# Patient Record
Sex: Female | Born: 1945 | Race: White | Hispanic: No | Marital: Married | Smoking: Never smoker
Health system: Southern US, Community
[De-identification: ages and names within clinical notes are randomized; demographics above are authoritative.]

## PROBLEM LIST (undated history)

## (undated) DIAGNOSIS — J449 Chronic obstructive pulmonary disease, unspecified: Secondary | ICD-10-CM

## (undated) DIAGNOSIS — C801 Malignant (primary) neoplasm, unspecified: Secondary | ICD-10-CM

## (undated) DIAGNOSIS — H269 Unspecified cataract: Secondary | ICD-10-CM

## (undated) DIAGNOSIS — T7840XA Allergy, unspecified, initial encounter: Secondary | ICD-10-CM

## (undated) DIAGNOSIS — K219 Gastro-esophageal reflux disease without esophagitis: Secondary | ICD-10-CM

## (undated) DIAGNOSIS — D869 Sarcoidosis, unspecified: Secondary | ICD-10-CM

## (undated) DIAGNOSIS — B019 Varicella without complication: Secondary | ICD-10-CM

## (undated) DIAGNOSIS — R011 Cardiac murmur, unspecified: Secondary | ICD-10-CM

## (undated) DIAGNOSIS — E785 Hyperlipidemia, unspecified: Secondary | ICD-10-CM

## (undated) DIAGNOSIS — M1990 Unspecified osteoarthritis, unspecified site: Secondary | ICD-10-CM

## (undated) DIAGNOSIS — I10 Essential (primary) hypertension: Secondary | ICD-10-CM

## (undated) DIAGNOSIS — Z9189 Other specified personal risk factors, not elsewhere classified: Secondary | ICD-10-CM

## (undated) DIAGNOSIS — F419 Anxiety disorder, unspecified: Secondary | ICD-10-CM

## (undated) DIAGNOSIS — K635 Polyp of colon: Secondary | ICD-10-CM

## (undated) DIAGNOSIS — J45909 Unspecified asthma, uncomplicated: Secondary | ICD-10-CM

## (undated) HISTORY — DX: Essential (primary) hypertension: I10

## (undated) HISTORY — PX: CARDIAC CATHETERIZATION: SHX172

## (undated) HISTORY — DX: Malignant (primary) neoplasm, unspecified: C80.1

## (undated) HISTORY — DX: Unspecified cataract: H26.9

## (undated) HISTORY — DX: Varicella without complication: B01.9

## (undated) HISTORY — DX: Polyp of colon: K63.5

## (undated) HISTORY — DX: Cardiac murmur, unspecified: R01.1

## (undated) HISTORY — DX: Allergy, unspecified, initial encounter: T78.40XA

## (undated) HISTORY — DX: Sarcoidosis, unspecified: D86.9

## (undated) HISTORY — DX: Anxiety disorder, unspecified: F41.9

## (undated) HISTORY — DX: Hyperlipidemia, unspecified: E78.5

## (undated) HISTORY — DX: Gastro-esophageal reflux disease without esophagitis: K21.9

## (undated) HISTORY — DX: Other specified personal risk factors, not elsewhere classified: Z91.89

## (undated) HISTORY — DX: Unspecified asthma, uncomplicated: J45.909

## (undated) HISTORY — DX: Unspecified osteoarthritis, unspecified site: M19.90

## (undated) HISTORY — DX: Chronic obstructive pulmonary disease, unspecified: J44.9

## (undated) HISTORY — PX: EYE SURGERY: SHX253

---

## 1976-06-16 DIAGNOSIS — D869 Sarcoidosis, unspecified: Secondary | ICD-10-CM

## 1976-06-16 HISTORY — DX: Sarcoidosis, unspecified: D86.9

## 1976-06-16 HISTORY — PX: LUNG BIOPSY: SHX232

## 1985-03-18 HISTORY — PX: ABDOMINAL HYSTERECTOMY: SHX81

## 2008-03-18 DIAGNOSIS — K635 Polyp of colon: Secondary | ICD-10-CM

## 2008-03-18 HISTORY — DX: Polyp of colon: K63.5

## 2016-01-22 LAB — HM COLONOSCOPY

## 2016-12-31 DIAGNOSIS — M810 Age-related osteoporosis without current pathological fracture: Secondary | ICD-10-CM | POA: Insufficient documentation

## 2016-12-31 DIAGNOSIS — M8589 Other specified disorders of bone density and structure, multiple sites: Secondary | ICD-10-CM | POA: Insufficient documentation

## 2016-12-31 LAB — HM DEXA SCAN

## 2017-12-16 LAB — HM MAMMOGRAPHY

## 2018-04-14 ENCOUNTER — Ambulatory Visit (INDEPENDENT_AMBULATORY_CARE_PROVIDER_SITE_OTHER): Payer: Medicare Other | Admitting: Nurse Practitioner

## 2018-04-14 ENCOUNTER — Encounter: Payer: Self-pay | Admitting: Nurse Practitioner

## 2018-04-14 VITALS — BP 146/76 | HR 92 | Temp 98.5°F | Ht 62.75 in | Wt 146.0 lb

## 2018-04-14 DIAGNOSIS — Z862 Personal history of diseases of the blood and blood-forming organs and certain disorders involving the immune mechanism: Secondary | ICD-10-CM | POA: Diagnosis not present

## 2018-04-14 DIAGNOSIS — Z959 Presence of cardiac and vascular implant and graft, unspecified: Secondary | ICD-10-CM

## 2018-04-14 DIAGNOSIS — I10 Essential (primary) hypertension: Secondary | ICD-10-CM | POA: Diagnosis not present

## 2018-04-14 DIAGNOSIS — Z87898 Personal history of other specified conditions: Secondary | ICD-10-CM | POA: Diagnosis not present

## 2018-04-14 NOTE — Patient Instructions (Signed)
Please sign medical release to get records from previous pcp, cardiology, pulmonology, and GYN.  Thank you for choosing [facility] to provide your health needs.

## 2018-04-15 ENCOUNTER — Encounter: Payer: Self-pay | Admitting: Nurse Practitioner

## 2018-04-15 NOTE — Progress Notes (Signed)
Subjective:  Patient ID: Kristen Suarez, female    DOB: [birth year]  Age: 73 y.o. MRN: [id number]  CC: Establish Care (est care/go over medications. referral to heart doctor)  HPI Moved from [place] to [place] with husband.  [employer] Loop monitor for 44yr. Hx of Syncopal episodes, last happened 01/2018 while execising (presyncopal, warning symptom (nausea and lightedheaded), incident occurs about every 67months per patient Needs ref to cardiology.  HTN: HCTZ and amlodipine were recently added Intermittent LE edema.  Hx of sarcoidosis. needs referral to pulmonology. Followed annually.  Hx of bladder prolapse. Unable to tolerate pessary, home bladder exercise and premarin cream (twice a week), managed by GYN.  Up to date with mammogram.  Will need refills in 65month  Last AWV done 10/2017.  Last labs done 12/2017. Reviewed past Medical, Social and Family history today.  Outpatient Medications Prior to Visit  Medication Sig Dispense Refill  . amLODipine (NORVASC) 5 MG tablet Take 5 mg by mouth daily.    Marland Kitchen CALCIUM PO Take by mouth.    . conjugated estrogens (PREMARIN) vaginal cream Place 1 Applicatorful vaginally daily. 1 gram 2 times a week.    . docusate sodium (STOOL SOFTENER LAXATIVE) 100 MG capsule Take 100 mg by mouth 2 (two) times daily.    . famotidine (PEPCID) 10 MG tablet Take 10 mg by mouth 2 (two) times daily. PRN    . hydrochlorothiazide (HYDRODIURIL) 25 MG tablet Take 25 mg by mouth daily. Pt report taking 1/2 tab daily.    Marland Kitchen lisinopril (PRINIVIL,ZESTRIL) 40 MG tablet Take 40 mg by mouth daily.    . Multiple Vitamins-Minerals (MULTIVITAL PO) Take by mouth.    . Probiotic Product (PROBIOTIC DAILY PO) Take by mouth.    . simvastatin (ZOCOR) 20 MG tablet Take 20 mg by mouth daily.     No facility-administered medications prior to visit.     ROS See HPI  Objective:  BP (!) 146/76   Pulse 92   Temp 98.5 F (36.9 C) (Oral)   Ht 5' 2.75" (1.594 m)   Wt 146 lb  (66.2 kg)   SpO2 96%   BMI 26.07 kg/m   BP Readings from Last 3 Encounters:  04/14/18 (!) 146/76    Wt Readings from Last 3 Encounters:  04/14/18 146 lb (66.2 kg)    Physical Exam Neck:     Musculoskeletal: Normal range of motion and neck supple.  Cardiovascular:     Rate and Rhythm: Normal rate and regular rhythm.     Pulses: Normal pulses.     Heart sounds: Normal heart sounds.  Pulmonary:     Effort: Pulmonary effort is normal.     Breath sounds: Normal breath sounds.  Musculoskeletal:     Right lower leg: No edema.     Left lower leg: No edema.  Lymphadenopathy:     Cervical: No cervical adenopathy.  Neurological:     Mental Status: She is alert and oriented to person, place, and time.  Psychiatric:        Mood and Affect: Mood normal.        Behavior: Behavior normal.        Thought Content: Thought content normal.     No results found for: WBC, HGB, HCT, PLT, GLUCOSE, CHOL, TRIG, HDL, LDLDIRECT, LDLCALC, ALT, AST, NA, K, CL, CREATININE, BUN, CO2, TSH, PSA, INR, GLUF, HGBA1C, MICROALBUR  Patient was never admitted.  Assessment & Plan:   Kambrie was seen today for establish care.  Diagnoses and all orders for this visit:  Hx of syncope -     Ambulatory referral to Cardiology  Hx of sarcoidosis -     Ambulatory referral to Pulmonology  Cardiac device in situ -     Ambulatory referral to Cardiology   I am having [name] Manthe maintain her Multiple Vitamins-Minerals (MULTIVITAL PO), CALCIUM PO, Probiotic Product (PROBIOTIC DAILY PO), hydrochlorothiazide, simvastatin, amLODipine, lisinopril, famotidine, docusate sodium, and conjugated estrogens.  No orders of the defined types were placed in this encounter.   Problem List Items Addressed This Visit    None    Visit Diagnoses    Hx of syncope    -  Primary   Relevant Orders   Ambulatory referral to Cardiology   Hx of sarcoidosis       Relevant Orders   Ambulatory referral to Pulmonology    Cardiac device in situ       Relevant Orders   Ambulatory referral to Cardiology       Follow-up: Return in about 2 months (around 06/13/2018) for HTN and .  Wilfred Lacy, NP

## 2018-04-16 ENCOUNTER — Encounter: Payer: Self-pay | Admitting: Nurse Practitioner

## 2018-04-27 ENCOUNTER — Ambulatory Visit (INDEPENDENT_AMBULATORY_CARE_PROVIDER_SITE_OTHER): Payer: Medicare Other | Admitting: Internal Medicine

## 2018-04-27 ENCOUNTER — Encounter: Payer: Self-pay | Admitting: Internal Medicine

## 2018-04-27 VITALS — BP 132/82 | HR 84 | Ht 62.75 in | Wt 145.2 lb

## 2018-04-27 DIAGNOSIS — R55 Syncope and collapse: Secondary | ICD-10-CM | POA: Diagnosis not present

## 2018-04-27 NOTE — Patient Instructions (Signed)

## 2018-04-27 NOTE — Progress Notes (Signed)
ELECTROPHYSIOLOGY CONSULT NOTE  Patient ID: Kristen Suarez, MRN: [id number], DOB/AGE: 1945/12/02 73 y.o. Admit date: (Not on file) Date of Consult: 04/27/2018  Primary Physician: Flossie Buffy, NP Primary Cardiologist: Pecolia Ades Winkels is a 73 y.o. female who is being seen today for the evaluation of syncope  at the request of CLN.    HPI Kristen Suarez is a 73 y.o. female referred for recurrent syncope.  These episodes have been sporadic and go back about 20 years.  Many of them have happened on airplanes preceded by vomiting.  Others have been associated with hot environments often standing.  She has had a little warning apart from the vomiting.  Recovery symptoms include nausea, blushing, diaphoresis and residual fatigue.  There is some orthostatic intolerance.  Recognizable.  Most recent episode occurred about 15 months ago prior to the implantation of her loop recorder.  She was at abdominal rehab.  She became aware that something was not right and was becoming presyncopal.  She laid down and the symptoms gradually abated.  The recovery symptoms were recognized.  Her husband is a Marine scientist.  He has noted that she has been pale and has had a "thready pulse "  No history of palpitations.  Cardiac evaluation is included apparently an ultrasound, stress testing and blood work.  These of all been assumed to be normal by the patient.  They are not yet available in epic although they have been delivered to her PCP   After most recent episode she had an implantable loop recorder placed in [place].  Has had   No interval syncope since implantation.  Hx of sarcoidosis-diagnosed by pulmonary biopsy in the 1970s.  She was on steroids on and off until the 90s.  She has been followed by her pulmonologist until just recently prior to her move.   Past Medical History:  Diagnosis Date  . Arthritis   . Asthma   . Chickenpox   . Colon polyps 2010  . Heart murmur   . History  of fainting spells of unknown cause   . Hyperlipidemia   . Hypertension   . Sarcoid 06/16/1976      Surgical History:  Past Surgical History:  Procedure Laterality Date  . ABDOMINAL HYSTERECTOMY  1987  . LUNG BIOPSY  06/16/1976     Home Meds: Current Meds  Medication Sig  . Albuterol Sulfate 2.5 MG/0.5ML NEBU Inhale into the lungs. 1 vail daily  . amLODipine (NORVASC) 5 MG tablet Take 5 mg by mouth daily.  Marland Kitchen CALCIUM PO Take 1 tablet by mouth daily.   Marland Kitchen conjugated estrogens (PREMARIN) vaginal cream Place 1 Applicatorful vaginally daily. 1 gram 2 times a week.  . docusate sodium (STOOL SOFTENER LAXATIVE) 100 MG capsule Take 100 mg by mouth 2 (two) times daily.  . famotidine (PEPCID) 10 MG tablet Take 10 mg by mouth 2 (two) times daily. PRN  . fluticasone-salmeterol (ADVAIR HFA) 115-21 MCG/ACT inhaler Inhale 2 puffs into the lungs 2 (two) times daily.  . hydrochlorothiazide (HYDRODIURIL) 25 MG tablet Take 25 mg by mouth daily. Pt report taking 1/2 tab daily.  Marland Kitchen lisinopril (PRINIVIL,ZESTRIL) 40 MG tablet Take 40 mg by mouth daily.  . Multiple Vitamins-Minerals (MULTIVITAL PO) Take 1 tablet by mouth daily.   . Probiotic Product (PROBIOTIC DAILY PO) Take 1 tablet by mouth daily.   . simvastatin (ZOCOR) 20 MG tablet Take 20 mg by mouth daily.  . Tiotropium Bromide Monohydrate (  SPIRIVA RESPIMAT) 2.5 MCG/ACT AERS Inhale into the lungs. Inhaled 2 puff daily    Allergies:  Allergies  Allergen Reactions  . Penicillins Itching  . Sulfa Antibiotics Itching    Social History   Socioeconomic History  . Marital status: Married    Spouse name: Not on file  . Number of children: Not on file  . Years of education: Not on file  . Highest education level: Not on file  Occupational History  . Not on file  Social Needs  . Financial resource strain: Not on file  . Food insecurity:    Worry: Not on file    Inability: Not on file  . Transportation needs:    Medical: Not on file     Non-medical: Not on file  Tobacco Use  . Smoking status: Never Smoker  . Smokeless tobacco: Never Used  Substance and Sexual Activity  . Alcohol use: Yes    Comment: once in a while  . Drug use: Never  . Sexual activity: Not on file  Lifestyle  . Physical activity:    Days per week: Not on file    Minutes per session: Not on file  . Stress: Not on file  Relationships  . Social connections:    Talks on phone: Not on file    Gets together: Not on file    Attends religious service: Not on file    Active member of club or organization: Not on file    Attends meetings of clubs or organizations: Not on file    Relationship status: Not on file  . Intimate partner violence:    Fear of current or ex partner: Not on file    Emotionally abused: Not on file    Physically abused: Not on file    Forced sexual activity: Not on file  Other Topics Concern  . Not on file  Social History Narrative  . Not on file     Family History  Problem Relation Age of Onset  . Arthritis Mother   . Cancer Mother   . Depression Mother   . Heart attack Mother   . Hyperlipidemia Mother   . Hypertension Mother   . Alcohol abuse Father   . Heart disease Father   . Hypertension Father   . Hyperlipidemia Father   . Heart disease Sister   . Stroke Sister   . Alcohol abuse Brother   . Heart disease Brother   . Hypertension Brother   . Hyperlipidemia Brother   . Multiple sclerosis Daughter   . Hyperlipidemia Son   . Heart disease Maternal Grandfather   . Heart disease Paternal Grandfather   . Hyperlipidemia Sister   . Alcohol abuse Sister   . Hyperlipidemia Sister   . Hypertension Sister   . Alcohol abuse Brother   . Early death Brother   . Heart attack Brother   . Heart disease Brother   . Hyperlipidemia Brother      ROS:  Please see the history of present illness.     All other systems reviewed and negative.    Physical Exam: Blood pressure 132/82, pulse 84, height 5' 2.75" (1.594 m),  weight 145 lb 3.2 oz (65.9 kg), SpO2 97 %. General: Well developed, well nourished female in no acute distress. Head: Normocephalic, atraumatic, sclera non-icteric, no xanthomas, nares are without discharge. EENT: normal  Lymph Nodes:  none Neck: Negative for carotid bruits. JVD not elevated. Back:without scoliosis kyphosis Lungs: Clear bilaterally to auscultation without wheezes,  rales, or rhonchi. Breathing is unlabored. Heart: RRR with S1 S2.  2 /6 systolic murmur . No rubs, or gallops appreciated. Abdomen: Soft, non-tender, non-distended with normoactive bowel sounds. No hepatomegaly. No rebound/guarding. No obvious abdominal masses. Msk:  Strength and tone appear normal for age. Extremities: No clubbing or cyanosis. No edema.  Distal pedal pulses are 2+ and equal bilaterally. Skin: Warm and Dry Neuro: Alert and oriented X 3. CN III-XII intact Grossly normal sensory and motor function . Psych:  Responds to questions appropriately with a normal affect.      Labs: Cardiac Enzymes No results for input(s): CKTOTAL, CKMB, TROPONINI in the last 72 hours. CBC No results found for: WBC, HGB, HCT, MCV, PLT PROTIME: No results for input(s): LABPROT, INR in the last 72 hours. Chemistry No results for input(s): NA, K, CL, CO2, BUN, CREATININE, CALCIUM, PROT, BILITOT, ALKPHOS, ALT, AST, GLUCOSE in the last 168 hours.  Invalid input(s): LABALBU Lipids No results found for: CHOL, HDL, LDLCALC, TRIG BNP No results found for: PROBNP Thyroid Function Tests: No results for input(s): TSH, T4TOTAL, T3FREE, THYROIDAB in the last 72 hours.  Invalid input(s): FREET3 Miscellaneous No results found for: DDIMER  Radiology/Studies:  No results found.  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   This is further supported by her husband's evaluation  Lengthy discussion regarding the physiology of neurally mediated bradycardia syncope  I think it is unlikely that cardiac sarcoid is contributing.  I will need to do some further reading on this; however, I would have anticipated an abnormal ECG would be quite sensitive for cardiac sarcoid not withstanding the fact that ventricular arrhythmias can be part of it.  Furthermore, the event history is inconsistent with arrhythmic syncope     Virl Axe

## 2018-04-30 ENCOUNTER — Encounter: Payer: Self-pay | Admitting: Nurse Practitioner

## 2018-04-30 NOTE — Progress Notes (Signed)
Abstracted result and sent to scan  

## 2018-05-08 ENCOUNTER — Encounter: Payer: Self-pay | Admitting: Nurse Practitioner

## 2018-05-08 DIAGNOSIS — K635 Polyp of colon: Secondary | ICD-10-CM | POA: Insufficient documentation

## 2018-05-08 DIAGNOSIS — K219 Gastro-esophageal reflux disease without esophagitis: Secondary | ICD-10-CM | POA: Insufficient documentation

## 2018-05-08 DIAGNOSIS — R55 Syncope and collapse: Secondary | ICD-10-CM | POA: Insufficient documentation

## 2018-05-08 DIAGNOSIS — Z800 Family history of malignant neoplasm of digestive organs: Secondary | ICD-10-CM | POA: Insufficient documentation

## 2018-05-08 DIAGNOSIS — I10 Essential (primary) hypertension: Secondary | ICD-10-CM | POA: Insufficient documentation

## 2018-05-08 DIAGNOSIS — J449 Chronic obstructive pulmonary disease, unspecified: Secondary | ICD-10-CM | POA: Insufficient documentation

## 2018-05-08 DIAGNOSIS — E785 Hyperlipidemia, unspecified: Secondary | ICD-10-CM | POA: Insufficient documentation

## 2018-05-08 DIAGNOSIS — D869 Sarcoidosis, unspecified: Secondary | ICD-10-CM | POA: Insufficient documentation

## 2018-05-08 DIAGNOSIS — N8111 Cystocele, midline: Secondary | ICD-10-CM | POA: Insufficient documentation

## 2018-05-08 DIAGNOSIS — Z961 Presence of intraocular lens: Secondary | ICD-10-CM | POA: Insufficient documentation

## 2018-05-08 DIAGNOSIS — L821 Other seborrheic keratosis: Secondary | ICD-10-CM | POA: Insufficient documentation

## 2018-05-08 LAB — CUP PACEART INCLINIC DEVICE CHECK
Date Time Interrogation Session: 20200221120313
Implantable Pulse Generator Implant Date: 20190109

## 2018-05-21 ENCOUNTER — Ambulatory Visit (INDEPENDENT_AMBULATORY_CARE_PROVIDER_SITE_OTHER): Payer: Medicare Other | Admitting: *Deleted

## 2018-05-21 DIAGNOSIS — R55 Syncope and collapse: Secondary | ICD-10-CM | POA: Diagnosis not present

## 2018-05-23 LAB — CUP PACEART REMOTE DEVICE CHECK
Date Time Interrogation Session: 20200305033942
Implantable Pulse Generator Implant Date: 20190109

## 2018-05-29 NOTE — Progress Notes (Signed)
Carelink Summary Report / Loop Recorder 

## 2018-05-31 ENCOUNTER — Encounter: Payer: Self-pay | Admitting: Nurse Practitioner

## 2018-06-09 ENCOUNTER — Ambulatory Visit: Payer: Medicare Other | Admitting: Nurse Practitioner

## 2018-06-15 ENCOUNTER — Encounter: Payer: Self-pay | Admitting: Nurse Practitioner

## 2018-06-16 ENCOUNTER — Encounter: Payer: Self-pay | Admitting: Nurse Practitioner

## 2018-06-16 ENCOUNTER — Telehealth: Payer: Self-pay | Admitting: Nurse Practitioner

## 2018-06-16 ENCOUNTER — Other Ambulatory Visit: Payer: Self-pay

## 2018-06-16 ENCOUNTER — Ambulatory Visit (INDEPENDENT_AMBULATORY_CARE_PROVIDER_SITE_OTHER): Payer: Medicare Other | Admitting: Nurse Practitioner

## 2018-06-16 VITALS — BP 144/90 | HR 74 | Temp 97.9°F | Ht 62.75 in | Wt 142.0 lb

## 2018-06-16 DIAGNOSIS — J441 Chronic obstructive pulmonary disease with (acute) exacerbation: Secondary | ICD-10-CM | POA: Diagnosis not present

## 2018-06-16 DIAGNOSIS — R509 Fever, unspecified: Secondary | ICD-10-CM

## 2018-06-16 MED ORDER — ALBUTEROL SULFATE (2.5 MG/3ML) 0.083% IN NEBU: 2.5000 mg | INHALATION_SOLUTION | Freq: Four times a day (QID) | RESPIRATORY_TRACT | 2 refills | Status: DC | PRN

## 2018-06-16 MED ORDER — PREDNISONE 10 MG (21) PO TBPK: ORAL_TABLET | ORAL | 0 refills | Status: DC

## 2018-06-16 MED ORDER — BENZONATATE 100 MG PO CAPS: 100.0000 mg | ORAL_CAPSULE | Freq: Three times a day (TID) | ORAL | 0 refills | Status: DC | PRN

## 2018-06-16 MED ORDER — LEVOFLOXACIN 750 MG PO TABS: 750.0000 mg | ORAL_TABLET | Freq: Every day | ORAL | 0 refills | Status: AC

## 2018-06-16 NOTE — Telephone Encounter (Signed)
Pt left vm stating she was returning someone's call. She thinks it is about her request for her albuterol refill. If someone could reach out to pt

## 2018-06-16 NOTE — Patient Instructions (Addendum)
Continue mucinex-DM as needed for cough. May alternate with benzonatate.  Use albuterol nebulizer twice a day for 2days, the as needed. Hold advair while taking ora prednisone. Continue Spiriva.  Start oral prednisone and oral antibiotics today. Take medications with food.  Maintain adequate oral hydration.  Go to ED if symptoms worsen or no improvement in 48hrs.  You will be contacted on Friday to re evaluate your symptoms

## 2018-06-16 NOTE — Telephone Encounter (Signed)
Spoke with the pt.  

## 2018-06-16 NOTE — Progress Notes (Signed)
Virtual Visit via Video Note  I connected with [name] on 06/16/18 at  1:30 PM EDT by a video enabled telemedicine application and verified that I am speaking with the correct person using two identifiers.   I discussed the limitations of evaluation and management by telemedicine and the availability of in person appointments. The patient expressed understanding and agreed to proceed.  History of Present Illness: Husband present during video cal. Cough  This is a new problem. The current episode started 1 to 4 weeks ago. The problem has been waxing and waning. The problem occurs constantly. The cough is productive of purulent sputum. Associated symptoms include chills, myalgias, nasal congestion, postnasal drip, rhinorrhea, a sore throat, shortness of breath and wheezing. Pertinent negatives include no chest pain, fever, headaches, heartburn or sweats. The symptoms are aggravated by lying down, pollens and cold air. She has tried a beta-agonist inhaler, OTC cough suppressant, ipratropium inhaler and steroid inhaler for the symptoms. The treatment provided mild relief. Her past medical history is significant for COPD and environmental allergies.  Medication Refill  Associated symptoms include chills, coughing, myalgias and a sore throat. Pertinent negatives include no chest pain, fever or headaches.   Observations/Objective: Physical Exam  Constitutional: She is oriented to person, place, and time. No distress.  Pulmonary/Chest: Effort normal. No respiratory distress.  Neurological: She is alert and oriented to person, place, and time.   Assessment and Plan: Raeanna was seen today for cough and medication refill.  Diagnoses and all orders for this visit:  Chronic obstructive pulmonary disease with acute exacerbation (HCC) -     albuterol (PROVENTIL) (2.5 MG/3ML) 0.083% nebulizer solution; Take 3 mLs (2.5 mg total) by nebulization every 6 (six) hours as needed for wheezing or  shortness of breath. -     levofloxacin (LEVAQUIN) 750 MG tablet; Take 1 tablet (750 mg total) by mouth daily for 5 days. -     benzonatate (TESSALON) 100 MG capsule; Take 1-2 capsules (100-200 mg total) by mouth 3 (three) times daily as needed for cough. -     predniSONE (STERAPRED UNI-PAK 21 TAB) 10 MG (21) TBPK tablet; As directed on package  Fever and chills -     albuterol (PROVENTIL) (2.5 MG/3ML) 0.083% nebulizer solution; Take 3 mLs (2.5 mg total) by nebulization every 6 (six) hours as needed for wheezing or shortness of breath. -     levofloxacin (LEVAQUIN) 750 MG tablet; Take 1 tablet (750 mg total) by mouth daily for 5 days. -     benzonatate (TESSALON) 100 MG capsule; Take 1-2 capsules (100-200 mg total) by mouth 3 (three) times daily as needed for cough. -     predniSONE (STERAPRED UNI-PAK 21 TAB) 10 MG (21) TBPK tablet; As directed on package   Follow Up Instructions: Continue mucinex-DM as needed for cough. May alternate with benzonatate. Use albuterol nebulizer twice a day for 2days, the as needed. Hold advair while taking ora prednisone. Continue Spiriva. Start oral prednisone and oral antibiotics today. Take medications with food. Maintain adequate oral hydration. Go to ED if symptoms worsen or no improvement in 48hrs. You will be contacted on Friday to re evaluate your symptoms   I discussed the assessment and treatment plan with the patient. The patient was provided an opportunity to ask questions and all were answered. The patient agreed with the plan and demonstrated an understanding of the instructions.   The patient was advised to call back or seek an in-person evaluation if the symptoms  worsen or if the condition fails to improve as anticipated.  Wilfred Lacy, NP

## 2018-06-18 ENCOUNTER — Encounter: Payer: Self-pay | Admitting: Nurse Practitioner

## 2018-06-20 ENCOUNTER — Encounter: Payer: Self-pay | Admitting: Nurse Practitioner

## 2018-06-22 ENCOUNTER — Ambulatory Visit (HOSPITAL_COMMUNITY)
Admission: EM | Admit: 2018-06-22 | Discharge: 2018-06-22 | Disposition: A | Discharge status: Home / Self Care | Payer: Medicare Other | Attending: Emergency Medicine | Admitting: Emergency Medicine

## 2018-06-22 ENCOUNTER — Encounter (HOSPITAL_COMMUNITY): Payer: Self-pay

## 2018-06-22 ENCOUNTER — Encounter: Payer: Self-pay | Admitting: Nurse Practitioner

## 2018-06-22 ENCOUNTER — Other Ambulatory Visit: Payer: Self-pay

## 2018-06-22 DIAGNOSIS — R69 Illness, unspecified: Secondary | ICD-10-CM | POA: Insufficient documentation

## 2018-06-22 DIAGNOSIS — R6883 Chills (without fever): Secondary | ICD-10-CM | POA: Diagnosis not present

## 2018-06-22 DIAGNOSIS — J111 Influenza due to unidentified influenza virus with other respiratory manifestations: Secondary | ICD-10-CM

## 2018-06-22 LAB — POCT URINALYSIS DIP (DEVICE)
Bilirubin Urine: NEGATIVE
Glucose, UA: NEGATIVE mg/dL
Hgb urine dipstick: NEGATIVE
Ketones, ur: NEGATIVE mg/dL
Nitrite: NEGATIVE
Protein, ur: NEGATIVE mg/dL
Specific Gravity, Urine: 1.025 (ref 1.005–1.030)
Urobilinogen, UA: 0.2 mg/dL (ref 0.0–1.0)
pH: 6 (ref 5.0–8.0)

## 2018-06-22 MED ORDER — AEROCHAMBER PLUS FLO-VU LARGE MISC
Status: AC
  Filled 2018-06-22: qty 1

## 2018-06-22 MED ORDER — AEROCHAMBER PLUS FLO-VU MEDIUM MISC
1.0000 | Freq: Once | Status: AC
  Administered 2018-06-22: 1

## 2018-06-22 NOTE — ED Provider Notes (Signed)
HPI  SUBJECTIVE:  Kristen Suarez is a 73 y.o. female who presents for evaluation of flulike symptoms that started a week ago.  States that she is overall feeling better, but that her primary care provider wanted her to come in for physical evaluation. Patient had an e-visit on 3/31 for cough productive of purulent sputum, myalgias, nasal congestion, postnasal drip, rhinorrhea, sore throat shortness of breath and wheezing.  she was reporting chills alternating between "feeling hot and cold".  She was thought to have COPD exacerbation, placed on prednisone, Levaquin 750 for 5 days, Tessalon.  Says she completed these and has felt much better.  Touched base with PMD today for nasal congestion, persistent cough, recurring mild headache.  Instructed to come in to the urgent care for evaluation.  She states that her cough is no longer productive.  She is using her albuterol nebulizer twice a day with improvement.  She states that she feels worse with being active.  She denies any further fevers but reports persistent wheezing.  No change in her baseline shortness of breath.  No chest pain, dyspnea on exertion.  She states that she has not had any chills since yesterday.  No antipyretic in the past 4 to 6 hours.  She also wonders if she might have a UTI as she had similar chills/hot cold sensations that ended up being pyelonephritis.  She denies any urinary complaints, abdominal, back, pelvic pain.  She has a past medical history of COPD and is compliant with all of her medications, hypertension, sarcoidosis, UTI, pyelonephritis, hypercholesterolemia.  GXQ:JJHE, [name], NP   Past Medical History:  Diagnosis Date  . Arthritis   . Asthma   . Chickenpox   . Colon polyps 2010  . Heart murmur   . History of fainting spells of unknown cause   . Hyperlipidemia   . Hypertension   . Sarcoid 06/16/1976    Past Surgical History:  Procedure Laterality Date  . ABDOMINAL HYSTERECTOMY  1987  . LUNG  BIOPSY  06/16/1976    Family History  Problem Relation Age of Onset  . Arthritis Mother   . Cancer Mother   . Depression Mother   . Heart attack Mother   . Hyperlipidemia Mother   . Hypertension Mother   . Alcohol abuse Father   . Heart disease Father   . Hypertension Father   . Hyperlipidemia Father   . Heart disease Sister   . Stroke Sister   . Alcohol abuse Brother   . Heart disease Brother   . Hypertension Brother   . Hyperlipidemia Brother   . Multiple sclerosis Daughter   . Hyperlipidemia Son   . Heart disease Maternal Grandfather   . Heart disease Paternal Grandfather   . Hyperlipidemia Sister   . Alcohol abuse Sister   . Hyperlipidemia Sister   . Hypertension Sister   . Alcohol abuse Brother   . Early death Brother   . Heart attack Brother   . Heart disease Brother   . Hyperlipidemia Brother     Social History   Tobacco Use  . Smoking status: Never Smoker  . Smokeless tobacco: Never Used  Substance Use Topics  . Alcohol use: Yes    Comment: once in a while  . Drug use: Never    No current facility-administered medications for this encounter.   Current Outpatient Medications:  .  albuterol (PROVENTIL) (2.5 MG/3ML) 0.083% nebulizer solution, Take 3 mLs (2.5 mg total) by nebulization every 6 (six) hours as  needed for wheezing or shortness of breath., Disp: 150 mL, Rfl: 2 .  amLODipine (NORVASC) 5 MG tablet, Take 5 mg by mouth daily., Disp: , Rfl:  .  benzonatate (TESSALON) 100 MG capsule, Take 1-2 capsules (100-200 mg total) by mouth 3 (three) times daily as needed for cough., Disp: 20 capsule, Rfl: 0 .  CALCIUM PO, Take 1 tablet by mouth daily. , Disp: , Rfl:  .  conjugated estrogens (PREMARIN) vaginal cream, Place 1 Applicatorful vaginally daily. 1 gram 2 times a week., Disp: , Rfl:  .  docusate sodium (STOOL SOFTENER LAXATIVE) 100 MG capsule, Take 100 mg by mouth 2 (two) times daily., Disp: , Rfl:  .  famotidine (PEPCID) 10 MG tablet, Take 10 mg by  mouth 2 (two) times daily. PRN, Disp: , Rfl:  .  fluticasone-salmeterol (ADVAIR HFA) 115-21 MCG/ACT inhaler, Inhale 2 puffs into the lungs 2 (two) times daily., Disp: , Rfl:  .  hydrochlorothiazide (HYDRODIURIL) 25 MG tablet, Take 25 mg by mouth daily. Pt report taking 1/2 tab daily., Disp: , Rfl:  .  lisinopril (PRINIVIL,ZESTRIL) 40 MG tablet, Take 40 mg by mouth daily., Disp: , Rfl:  .  Multiple Vitamins-Minerals (MULTIVITAL PO), Take 1 tablet by mouth daily. , Disp: , Rfl:  .  predniSONE (STERAPRED UNI-PAK 21 TAB) 10 MG (21) TBPK tablet, As directed on package, Disp: 21 tablet, Rfl: 0 .  Probiotic Product (PROBIOTIC DAILY PO), Take 1 tablet by mouth daily. , Disp: , Rfl:  .  simvastatin (ZOCOR) 20 MG tablet, Take 20 mg by mouth daily., Disp: , Rfl:  .  Tiotropium Bromide Monohydrate (SPIRIVA RESPIMAT) 2.5 MCG/ACT AERS, Inhale into the lungs. Inhaled 2 puff daily, Disp: , Rfl:   Allergies  Allergen Reactions  . Penicillins Itching  . Sulfa Antibiotics Itching     ROS  As noted in HPI.   Physical Exam  BP 120/62 (BP Location: Right Arm)   Pulse 86   Temp 98.4 F (36.9 C) (Oral)   Resp 18   Wt 63.5 kg   SpO2 100%   BMI 25.00 kg/m   Constitutional: Well developed, well nourished, no acute distress Eyes: PERRL, EOMI, conjunctiva normal bilaterally HENT: Normocephalic, atraumatic,mucus membranes moist Respiratory: Few scattered expiratory wheezing.  No rales, rhonchi.  Fair air movement.  No respiratory distress. Cardiovascular: Normal rate and rhythm, no murmurs, no gallops, no rubs GI: Soft, nondistended, nontender, no rebound, no guarding, no suprapubic, flank tenderness Back: no CVAT skin: No rash, skin intact Musculoskeletal: No edema, no tenderness, no deformities Neurologic: Alert & oriented x 3, CN III-XII grossly intact, no motor deficits, sensation grossly intact Psychiatric: Speech and behavior appropriate   ED Course   Medications  AeroChamber Plus Flo-Vu  Medium MISC 1 each (1 each Other Given 06/22/18 1550)    Orders Placed This Encounter  Procedures  . Urine culture    Standing Status:   Standing    Number of Occurrences:   1    Order Specific Question:   Patient immune status    Answer:   Normal  . POCT urinalysis dip (device)    Standing Status:   Standing    Number of Occurrences:   1   Results for orders placed or performed during the hospital encounter of 06/22/18 (from the past 24 hour(s))  POCT urinalysis dip (device)     Status: Abnormal   Collection Time: 06/22/18  3:41 PM  Result Value Ref Range   Glucose, UA NEGATIVE NEGATIVE  mg/dL   Bilirubin Urine NEGATIVE NEGATIVE   Ketones, ur NEGATIVE NEGATIVE mg/dL   Specific Gravity, Urine 1.025 1.005 - 1.030   Hgb urine dipstick NEGATIVE NEGATIVE   pH 6.0 5.0 - 8.0   Protein, ur NEGATIVE NEGATIVE mg/dL   Urobilinogen, UA 0.2 0.0 - 1.0 mg/dL   Nitrite NEGATIVE NEGATIVE   Leukocytes,Ua TRACE (A) NEGATIVE   No results found.  ED Clinical Impression  Influenza-like illness   ED Assessment/Plan  Outside records reviewed.  As noted in HPI.  While patient's respiratory symptoms have significantly improved, concern for residual chills the patient is reporting.  Will check a UA especially since she said that she had these symptoms with previous pyelonephritis.  Otherwise, I do not think that we need to do anything other than have her continue her albuterol every 4-6 hours as needed, and continue her maintenance medications.  Do not think that the patient has a residual pneumonia, and needs a chest x-ray today.  We will give her a spacer here as she states that she does not have one.  UA reviewed.  Trace leukocytes.  We will send off for culture prior to initiating antibiotic treatment given recent Levaquin use and lack of urinary complaints.  Discussed labs, MDM, treatment plan, and plan for follow-up with patient Discussed sn/sx that should prompt return to the ED. patient agrees  with plan.   Meds ordered this encounter  Medications  . AeroChamber Plus Flo-Vu Medium MISC 1 each    *This clinic note was created using [employer]. Therefore, there may be occasional mistakes despite careful proofreading.  ?   Melynda Ripple, MD 06/22/18 [phone]

## 2018-06-22 NOTE — ED Notes (Signed)
Patient verbalizes understanding of discharge instructions. Opportunity for questioning and answers were provided. Patient discharged from [facility] by [place].

## 2018-06-22 NOTE — ED Triage Notes (Signed)
Pt states she has flu like symptoms x 1 week. Pt temp of 98.0 , body aches and a cough. This was a week ago. Pt states she feels much better today.

## 2018-06-22 NOTE — Discharge Instructions (Addendum)
Your urine had a few leukocytes in it, but this is not diagnostic for urinary tract infection.  I am sending your urine off for culture before initiating you on an additional course of antibiotics since you have no urinary complaints and you have not had chills in 24 hours.  We will call you if it comes back positive for urinary tract infection and will start you on the appropriate antibiotics at that time.  In the meantime, you may use your albuterol nebulizer every 4- 6 hours, continue your maintenance medications, sure you use the spacer with the pump inhalers.  The spacer will make the pump inhalers much more effective.  Go immediately to the ER if your chills return and are accompanied with fevers above 100.4, worsening shortness of breath, severe back pain, abdominal pain, or for any other concerns.

## 2018-06-23 ENCOUNTER — Ambulatory Visit (INDEPENDENT_AMBULATORY_CARE_PROVIDER_SITE_OTHER): Payer: Medicare Other | Admitting: *Deleted

## 2018-06-23 ENCOUNTER — Other Ambulatory Visit: Payer: Self-pay

## 2018-06-23 DIAGNOSIS — R55 Syncope and collapse: Secondary | ICD-10-CM | POA: Diagnosis not present

## 2018-06-23 LAB — CUP PACEART REMOTE DEVICE CHECK
Date Time Interrogation Session: 20200407041222
Implantable Pulse Generator Implant Date: 20190109

## 2018-06-23 LAB — URINE CULTURE: Culture: NO GROWTH

## 2018-06-24 ENCOUNTER — Encounter: Payer: Self-pay | Admitting: Nurse Practitioner

## 2018-06-24 ENCOUNTER — Telehealth: Payer: Medicare Other | Admitting: Family

## 2018-06-24 DIAGNOSIS — Z20822 Contact with and (suspected) exposure to covid-19: Secondary | ICD-10-CM

## 2018-06-24 DIAGNOSIS — R6889 Other general symptoms and signs: Principal | ICD-10-CM

## 2018-06-24 NOTE — Progress Notes (Signed)
Based on what you shared with me, I feel your condition warrants further evaluation and I recommend that you be seen for a face to face office visit.  I have reviewed your ED notes and feel that since you continue with symptoms you be seen face to face. You do sound like you have Covid-19 like symptoms and recommend avoid ED unless you have active SOB or chest pain. You need to call your PCP and they may be able to treat you over the phone.    NOTE: If you entered your credit card information for this eVisit, you will not be charged. You may see a "hold" on your card for the $35 but that hold will drop off and you will not have a charge processed.  If you are having a true medical emergency please call 911.  If you need an urgent face to face visit, [facility] has four urgent care centers for your convenience.    PLEASE NOTE: THE [facility] LOCATIONS AND URGENT CARE CLINICS DO NOT HAVE THE TESTING FOR CORONAVIRUS COVID19 AVAILABLE.  IF YOU FEEL YOU NEED THIS TEST YOU MUST GO TO A TRIAGE LOCATION AT [facility]   [email or web address] to reserve your spot online an avoid wait times  [facility] [address] [place], [place] [address] Modified hours of operation: Monday-Friday, 10 AM to 6 PM  Saturday & Sunday 10 AM to 4 PM *Across the street from [place] (New Address!) [address] [place], [place] [address] *Just off [employer], across the road from [place] hours of operation: Monday-Friday, 10 AM to 5 PM  Closed Saturday & Sunday   The following sites will take your insurance:  . [facility] [place] a Provider at this Location  [address] [place], [place] [address] . 10 am to 8 pm Monday-Friday . 12 pm to 8 pm Saturday-Sunday   . [facility] at [place] a Provider at this Location  [address], [place] [address] . 8 am to 8 pm Monday-Friday . 9 am to 6 pm Saturday . 11 am to 6 pm Sunday   . [facility] at [place] Get Driving Directions  [address] [place], [place] [address] . 8 am to 8 pm Monday-Friday . 8 am to 4 pm Saturday-Sunday   Your e-visit answers were reviewed by a board certified advanced clinical practitioner to complete your personal care plan.  Thank you for using e-Visits.

## 2018-06-25 ENCOUNTER — Encounter: Payer: Self-pay | Admitting: Nurse Practitioner

## 2018-06-29 ENCOUNTER — Encounter: Payer: Self-pay | Admitting: Nurse Practitioner

## 2018-06-29 ENCOUNTER — Institutional Professional Consult (permissible substitution): Payer: Medicare Other | Admitting: Emergency Medicine

## 2018-06-29 DIAGNOSIS — I10 Essential (primary) hypertension: Secondary | ICD-10-CM

## 2018-06-29 MED ORDER — AMLODIPINE BESYLATE 5 MG PO TABS: 5.0000 mg | ORAL_TABLET | Freq: Every day | ORAL | 1 refills | Status: DC

## 2018-07-01 NOTE — Progress Notes (Signed)
Carelink Summary Report / Loop Recorder 

## 2018-07-08 ENCOUNTER — Encounter: Payer: Self-pay | Admitting: Nurse Practitioner

## 2018-07-08 MED ORDER — SIMVASTATIN 20 MG PO TABS: 20.0000 mg | ORAL_TABLET | Freq: Every day | ORAL | 0 refills | Status: DC

## 2018-07-14 ENCOUNTER — Encounter: Payer: Self-pay | Admitting: Nurse Practitioner

## 2018-07-27 ENCOUNTER — Encounter: Payer: Self-pay | Admitting: Nurse Practitioner

## 2018-07-27 ENCOUNTER — Other Ambulatory Visit: Payer: Self-pay

## 2018-07-27 ENCOUNTER — Ambulatory Visit (INDEPENDENT_AMBULATORY_CARE_PROVIDER_SITE_OTHER): Payer: Medicare Other | Admitting: *Deleted

## 2018-07-27 DIAGNOSIS — R55 Syncope and collapse: Secondary | ICD-10-CM

## 2018-07-27 LAB — CUP PACEART REMOTE DEVICE CHECK
Date Time Interrogation Session: 20200510094313
Implantable Pulse Generator Implant Date: 20190109

## 2018-07-27 MED ORDER — SPIRIVA RESPIMAT 2.5 MCG/ACT IN AERS: 2.0000 | INHALATION_SPRAY | Freq: Every day | RESPIRATORY_TRACT | 0 refills | Status: DC

## 2018-08-03 NOTE — Progress Notes (Signed)
Carelink Summary Report / Loop Recorder 

## 2018-08-27 ENCOUNTER — Other Ambulatory Visit: Payer: Self-pay

## 2018-08-27 ENCOUNTER — Encounter: Payer: Self-pay | Admitting: Emergency Medicine

## 2018-08-27 ENCOUNTER — Ambulatory Visit (INDEPENDENT_AMBULATORY_CARE_PROVIDER_SITE_OTHER): Payer: Medicare Other | Admitting: Emergency Medicine

## 2018-08-27 DIAGNOSIS — D869 Sarcoidosis, unspecified: Secondary | ICD-10-CM | POA: Diagnosis not present

## 2018-08-27 DIAGNOSIS — J441 Chronic obstructive pulmonary disease with (acute) exacerbation: Secondary | ICD-10-CM | POA: Diagnosis not present

## 2018-08-27 NOTE — Assessment & Plan Note (Signed)
Associated with her sarcoidosis. She has had PFt before.  Currently uses Spiriva, Advair as well as scheduled albuterol nebs every morning.  May be able to drop this off and changed as needed.  We will experience with this.  She is also wondering whether she needs both long-acting inhalers.  Like to see new pulmonary function testing, assess her airflow before deciding which one we may want to try to discontinue.  Please continue Spiriva 2 puffs once daily, Advair 2 puffs twice a day for now. You can try changing your scheduled albuterol nebulizers to using them just as needed.  Pay attention to whether you miss the medication, how it helps you when you take it. We will plan to repeat your PFT and a CT chest in October 2020 to compare with your priors. We talked about lisinopril today.  It can sometimes be associated with coughing.  Depending on how your symptoms progress we may talk about finding an alternative. Please call our office if you have any breathing problems, any flaring of your obstructive lung disease. Follow with Dr Lamonte Sakai in October 2020

## 2018-08-27 NOTE — Progress Notes (Signed)
Subjective:    Patient ID: Kristen Suarez, female    DOB: February 27, 1946, 73 y.o.   MRN: [id number]  HPI 74 year old never smoker with a history of hyperlipidemia, hypertension (on lisinopril), long-standing syncope / fainting spells. She also carries a history of sarcoidosis (R lung bx 1978) and asthma / COPD.  Treated at least once with an extended course prednisone. Otherwise has had flares with short courses > about once a year.     Currently managed on Spiriva and Advair, has been on this medication since 2017 (had cough w Breo).  She also uses albuterol nebs, at least once daily, usually in the morning. She isn't sure that these help her.   She reports flu-like sx in early April, characterized by fatigue, persistent cough prod of mucous, wheeze.   She had some exertional dyspnea, happens w bending. Works in the garden, sometimes stops to stand. Occasional cough, probably every day. Non-productive.  She underwent a loop recorder telemetry Feb through May > no evidence tachy, brady or A Fib. ECG also normal.    Review of Systems  Constitutional: Positive for unexpected weight change. Negative for fever.  HENT: Negative for congestion, dental problem, ear pain, nosebleeds, postnasal drip, rhinorrhea, sinus pressure, sneezing, sore throat and trouble swallowing.   Eyes: Negative for redness and itching.  Respiratory: Positive for cough and shortness of breath. Negative for chest tightness and wheezing.   Cardiovascular: Negative for palpitations and leg swelling.  Gastrointestinal: Negative for nausea and vomiting.  Genitourinary: Negative for dysuria.  Musculoskeletal: Negative for joint swelling.  Skin: Negative for rash.  Neurological: Negative for headaches.  Hematological: Does not bruise/bleed easily.  Psychiatric/Behavioral: Negative for dysphoric mood. The patient is nervous/anxious.     Past Medical History:  Diagnosis Date  . Arthritis   . Asthma   . Chickenpox   .  Colon polyps 2010  . Heart murmur   . History of fainting spells of unknown cause   . Hyperlipidemia   . Hypertension   . Sarcoid 06/16/1976     Family History  Problem Relation Age of Onset  . Arthritis Mother   . Cancer Mother   . Depression Mother   . Heart attack Mother   . Hyperlipidemia Mother   . Hypertension Mother   . Alcohol abuse Father   . Heart disease Father   . Hypertension Father   . Hyperlipidemia Father   . Heart disease Sister   . Stroke Sister   . Alcohol abuse Brother   . Heart disease Brother   . Hypertension Brother   . Hyperlipidemia Brother   . Multiple sclerosis Daughter   . Hyperlipidemia Son   . Heart disease Maternal Grandfather   . Heart disease Paternal Grandfather   . Hyperlipidemia Sister   . Alcohol abuse Sister   . Hyperlipidemia Sister   . Hypertension Sister   . Alcohol abuse Brother   . Early death Brother   . Heart attack Brother   . Heart disease Brother   . Hyperlipidemia Brother      Social History   Socioeconomic History  . Marital status: Married    Spouse name: Not on file  . Number of children: Not on file  . Years of education: Not on file  . Highest education level: Not on file  Occupational History  . Not on file  Social Needs  . Financial resource strain: Not on file  . Food insecurity    Worry: Not on file  Inability: Not on file  . Transportation needs    Medical: Not on file    Non-medical: Not on file  Tobacco Use  . Smoking status: Never Smoker  . Smokeless tobacco: Never Used  Substance and Sexual Activity  . Alcohol use: Yes    Comment: once in a while  . Drug use: Never  . Sexual activity: Not on file  Lifestyle  . Physical activity    Days per week: Not on file    Minutes per session: Not on file  . Stress: Not on file  Relationships  . Social Herbalist on phone: Not on file    Gets together: Not on file    Attends religious service: Not on file    Active member of club  or organization: Not on file    Attends meetings of clubs or organizations: Not on file    Relationship status: Not on file  . Intimate partner violence    Fear of current or ex partner: Not on file    Emotionally abused: Not on file    Physically abused: Not on file    Forced sexual activity: Not on file  Other Topics Concern  . Not on file  Social History Narrative  . Not on file     Allergies  Allergen Reactions  . Penicillins Itching  . Sulfa Antibiotics Itching  . Keflex [Cephalexin] Rash     Outpatient Medications Prior to Visit  Medication Sig Dispense Refill  . albuterol (PROVENTIL) (2.5 MG/3ML) 0.083% nebulizer solution Take 3 mLs (2.5 mg total) by nebulization every 6 (six) hours as needed for wheezing or shortness of breath. 150 mL 2  . amLODipine (NORVASC) 5 MG tablet Take 1 tablet (5 mg total) by mouth daily. 90 tablet 1  . CALCIUM PO Take 1 tablet by mouth daily.     Marland Kitchen conjugated estrogens (PREMARIN) vaginal cream Place 1 Applicatorful vaginally daily. 1 gram 2 times a week.    . docusate sodium (STOOL SOFTENER LAXATIVE) 100 MG capsule Take 100 mg by mouth 2 (two) times daily.    . famotidine (PEPCID) 10 MG tablet Take 10 mg by mouth 2 (two) times daily. PRN    . fluticasone-salmeterol (ADVAIR HFA) 115-21 MCG/ACT inhaler Inhale 2 puffs into the lungs 2 (two) times daily.    . hydrochlorothiazide (HYDRODIURIL) 25 MG tablet Take 25 mg by mouth daily. Pt report taking 1/2 tab daily.    Marland Kitchen lisinopril (PRINIVIL,ZESTRIL) 40 MG tablet Take 40 mg by mouth daily.    . Multiple Vitamins-Minerals (MULTIVITAL PO) Take 1 tablet by mouth daily.     . Probiotic Product (PROBIOTIC DAILY PO) Take 1 tablet by mouth daily.     . simvastatin (ZOCOR) 20 MG tablet Take 1 tablet (20 mg total) by mouth daily. 90 tablet 0  . SPIRIVA RESPIMAT 2.5 MCG/ACT AERS Inhale 2 puffs into the lungs daily. 3 Inhaler 0  . predniSONE (STERAPRED UNI-PAK 21 TAB) 10 MG (21) TBPK tablet As directed on package  21 tablet 0   No facility-administered medications prior to visit.         Objective:   Physical Exam Vitals:   08/27/18 0859  BP: 122/86  Pulse: 80  Temp: 98.2 F (36.8 C)  TempSrc: Oral  SpO2: 98%  Weight: 146 lb 9.6 oz (66.5 kg)  Height: 5' 2.75" (1.594 m)   Gen: Pleasant, well-nourished, in no distress,  normal affect  ENT: No lesions,  mouth  clear,  oropharynx clear, no postnasal drip  Neck: No JVD, no stridor  Lungs: No use of accessory muscles, some mild expiratory squeaks, right upper lobe inspiratory crackles, no overt wheezing  Cardiovascular: RRR, heart sounds normal, no murmur or gallops, no peripheral edema  Musculoskeletal: No deformities, no cyanosis or clubbing  Neuro: alert, awake, non focal  Skin: Warm, no lesions or rash      Assessment & Plan:  COPD (chronic obstructive pulmonary disease) ([place]) Associated with her sarcoidosis. She has had PFt before.  Currently uses Spiriva, Advair as well as scheduled albuterol nebs every morning.  May be able to drop this off and changed as needed.  We will experience with this.  She is also wondering whether she needs both long-acting inhalers.  Like to see new pulmonary function testing, assess her airflow before deciding which one we may want to try to discontinue.  Please continue Spiriva 2 puffs once daily, Advair 2 puffs twice a day for now. You can try changing your scheduled albuterol nebulizers to using them just as needed.  Pay attention to whether you miss the medication, how it helps you when you take it. We will plan to repeat your PFT and a CT chest in October 2020 to compare with your priors. We talked about lisinopril today.  It can sometimes be associated with coughing.  Depending on how your symptoms progress we may talk about finding an alternative. Please call our office if you have any breathing problems, any flaring of your obstructive lung disease. Follow with Dr Lamonte Sakai in October 2020   Sarcoidosis We will obtain copies of your prior imaging and pulmonary function testing from [place]. Repeat PFT, CT chest in October 2020 to compare and follow. Flu shot annually We talked today about protective strategies for her minimize risk of contracting SARS CoV2  Baltazar Apo, MD, PhD 08/27/2018, 9:43 AM [facility] [phone] or if no answer [phone]

## 2018-08-27 NOTE — Assessment & Plan Note (Signed)
We will obtain copies of your prior imaging and pulmonary function testing from [place]. Repeat PFT, CT chest in October 2020 to compare and follow. Flu shot annually We talked today about protective strategies for her minimize risk of contracting SARS CoV2

## 2018-08-27 NOTE — Patient Instructions (Signed)
Please continue Spiriva 2 puffs once daily, Advair 2 puffs twice a day for now. You can try changing your scheduled albuterol nebulizers to using them just as needed.  Pay attention to whether you miss the medication, how it helps you when you take it. We will obtain copies of your prior imaging and pulmonary function testing from [place]. We will plan to repeat your PFT and a CT chest in October 2020 to compare with your priors. We talked about lisinopril today.  It can sometimes be associated with coughing.  Depending on how your symptoms progress we may talk about finding an alternative. Please call our office if you have any breathing problems, any flaring of your obstructive lung disease. Follow with Dr Lamonte Sakai in October 2020

## 2018-08-28 ENCOUNTER — Ambulatory Visit (INDEPENDENT_AMBULATORY_CARE_PROVIDER_SITE_OTHER): Payer: Medicare Other | Admitting: *Deleted

## 2018-08-28 ENCOUNTER — Encounter: Payer: Self-pay | Admitting: Nurse Practitioner

## 2018-08-28 DIAGNOSIS — J42 Unspecified chronic bronchitis: Secondary | ICD-10-CM

## 2018-08-28 DIAGNOSIS — J449 Chronic obstructive pulmonary disease, unspecified: Secondary | ICD-10-CM

## 2018-08-28 DIAGNOSIS — R55 Syncope and collapse: Secondary | ICD-10-CM

## 2018-08-28 LAB — CUP PACEART REMOTE DEVICE CHECK
Date Time Interrogation Session: 20200612100922
Implantable Pulse Generator Implant Date: 20190109

## 2018-08-28 MED ORDER — FLUTICASONE-SALMETEROL 115-21 MCG/ACT IN AERO: 2.0000 | INHALATION_SPRAY | Freq: Two times a day (BID) | RESPIRATORY_TRACT | 0 refills | Status: DC

## 2018-08-31 NOTE — Progress Notes (Signed)
Carelink Summary Report / Loop Recorder 

## 2018-09-07 ENCOUNTER — Telehealth: Payer: Self-pay

## 2018-09-07 NOTE — Telephone Encounter (Signed)
Questions for Screening COVID-19  Symptom onset: n/a  Travel or Contacts: no  During this illness, did/does the patient experience any of the following symptoms? Fever >100.49F []   Yes [x]   No []   Unknown Subjective fever (felt feverish) []   Yes [x]   No []   Unknown Chills []   Yes [x]   No []   Unknown Muscle aches (myalgia) []   Yes [x]   No []   Unknown Runny nose (rhinorrhea) []   Yes [x]   No []   Unknown Sore throat []   Yes [x]   No []   Unknown Cough (new onset or worsening of chronic cough) []   Yes [x]   No []   Unknown Shortness of breath (dyspnea) []   Yes [x]   No []   Unknown Nausea or vomiting []   Yes [x]   No []   Unknown Headache []   Yes [x]   No []   Unknown Abdominal pain  []   Yes [x]   No []   Unknown Diarrhea (?3 loose/looser than normal stools/24hr period) []   Yes [x]   No []   Unknown Other, specify:  Patient risk factors: Smoker? []   Current []   Former []   Never If female, currently pregnant? []   Yes []   No  Patient Active Problem List   Diagnosis Date Noted  . Sarcoidosis 05/08/2018  . HTN (hypertension), benign 05/08/2018  . Colon polyp 05/08/2018  . Family hx of colon cancer 05/08/2018  . Cystocele, midline 05/08/2018  . Hyperlipidemia 05/08/2018  . Vasovagal syncope 05/08/2018  . Seborrheic keratosis 05/08/2018  . Pseudophakia of both eyes 05/08/2018  . GERD (gastroesophageal reflux disease) 05/08/2018  . COPD (chronic obstructive pulmonary disease) ([place]) 05/08/2018    Plan:  []   High risk for COVID-19 with red flags go to ED (with CP, SOB, weak/lightheaded, or fever > 101.5). Call ahead.  []   High risk for COVID-19 but stable. Inform provider and coordinate time for [facility] visit.   []   No red flags but URI signs or symptoms okay for [facility] visit.

## 2018-09-08 ENCOUNTER — Ambulatory Visit (INDEPENDENT_AMBULATORY_CARE_PROVIDER_SITE_OTHER): Payer: Medicare Other | Admitting: Nurse Practitioner

## 2018-09-08 ENCOUNTER — Encounter: Payer: Self-pay | Admitting: Nurse Practitioner

## 2018-09-08 ENCOUNTER — Other Ambulatory Visit: Payer: Self-pay

## 2018-09-08 VITALS — BP 144/78 | HR 88 | Temp 99.7°F | Ht 62.75 in | Wt 147.8 lb

## 2018-09-08 DIAGNOSIS — L989 Disorder of the skin and subcutaneous tissue, unspecified: Secondary | ICD-10-CM

## 2018-09-08 DIAGNOSIS — G252 Other specified forms of tremor: Secondary | ICD-10-CM | POA: Insufficient documentation

## 2018-09-08 DIAGNOSIS — H269 Unspecified cataract: Secondary | ICD-10-CM | POA: Diagnosis not present

## 2018-09-08 DIAGNOSIS — M19041 Primary osteoarthritis, right hand: Secondary | ICD-10-CM

## 2018-09-08 DIAGNOSIS — M19042 Primary osteoarthritis, left hand: Secondary | ICD-10-CM

## 2018-09-08 DIAGNOSIS — I10 Essential (primary) hypertension: Secondary | ICD-10-CM | POA: Diagnosis not present

## 2018-09-08 DIAGNOSIS — R259 Unspecified abnormal involuntary movements: Secondary | ICD-10-CM

## 2018-09-08 DIAGNOSIS — E782 Mixed hyperlipidemia: Secondary | ICD-10-CM | POA: Diagnosis not present

## 2018-09-08 LAB — BASIC METABOLIC PANEL
BUN: 23 mg/dL (ref 6–23)
CO2: 31 mEq/L (ref 19–32)
Calcium: 9.7 mg/dL (ref 8.4–10.5)
Chloride: 99 mEq/L (ref 96–112)
Creatinine, Ser: 0.96 mg/dL (ref 0.40–1.20)
GFR: 56.95 mL/min — ABNORMAL LOW (ref >60.00)
Glucose, Bld: 94 mg/dL (ref 70–99)
Potassium: 4 mEq/L (ref 3.5–5.1)
Sodium: 137 mEq/L (ref 135–145)

## 2018-09-08 LAB — LIPID PANEL
Cholesterol: 191 mg/dL (ref 0–200)
HDL: 53.3 mg/dL (ref >39.00)
LDL Cholesterol: 110 mg/dL — ABNORMAL HIGH (ref 0–99)
NonHDL: 137.31
Total CHOL/HDL Ratio: 4
Triglycerides: 135 mg/dL (ref 0.0–149.0)
VLDL: 27 mg/dL (ref 0.0–40.0)

## 2018-09-08 LAB — HEPATIC FUNCTION PANEL
ALT: 21 U/L (ref 0–35)
AST: 19 U/L (ref 0–37)
Albumin: 4.3 g/dL (ref 3.5–5.2)
Alkaline Phosphatase: 78 U/L (ref 39–117)
Bilirubin, Direct: 0.1 mg/dL (ref 0.0–0.3)
Total Bilirubin: 0.5 mg/dL (ref 0.2–1.2)
Total Protein: 6.6 g/dL (ref 6.0–8.3)

## 2018-09-08 LAB — TSH: TSH: 1.42 u[IU]/mL (ref 0.35–4.50)

## 2018-09-08 NOTE — Assessment & Plan Note (Addendum)
Chronic, onset over 1year ago, intermittent in past but now seems persistent in last 85month. No FHx of neuromuscular disorder of parkinsons' dx

## 2018-09-08 NOTE — Assessment & Plan Note (Signed)
Stable with amlodipine, lisinopril, and HCTZ BP Readings from Last 3 Encounters:  09/08/18 (!) 144/78  08/27/18 122/86  06/22/18 120/62

## 2018-09-08 NOTE — Progress Notes (Signed)
Subjective:  Patient ID: Kristen Suarez, female    DOB: 1945/12/11  Age: 73 y.o. MRN: [id number]  CC: Follow-up (2 mo f/u-not fastiong--pt is of hand and feet cramping and swelling in fingers,painful/ going on for/ spot on right cheek,on top of head and nose and just couple general questions. FYI-MM due to 12/2018. )  Rash This is a recurrent problem. The current episode started more than 1 month ago. The problem is unchanged. The affected locations include the face. The rash is characterized by redness and scaling. Pertinent negatives include no eye pain, facial edema, fever or sore throat. Past treatments include nothing.  Hand Pain  The incident occurred more than 1 week ago. There was no injury mechanism. The pain is present in the right fingers and left fingers. The quality of the pain is described as aching and cramping. The pain does not radiate. The pain has been intermittent since the incident. Pertinent negatives include no numbness or tingling. The symptoms are aggravated by movement, palpation and lifting. She has tried rest for the symptoms.  use of sunscreen and hats when outside. No joint effusion, no redness, no morning stiffness. Hand pain and finger stiffness worse after working in garden.  Reviewed past Medical, Social and Family history today.  Outpatient Medications Prior to Visit  Medication Sig Dispense Refill  . albuterol (PROVENTIL) (2.5 MG/3ML) 0.083% nebulizer solution Take 3 mLs (2.5 mg total) by nebulization every 6 (six) hours as needed for wheezing or shortness of breath. 150 mL 2  . amLODipine (NORVASC) 5 MG tablet Take 1 tablet (5 mg total) by mouth daily. 90 tablet 1  . CALCIUM PO Take 1 tablet by mouth daily.     Marland Kitchen conjugated estrogens (PREMARIN) vaginal cream Place 1 Applicatorful vaginally daily. 1 gram 2 times a week.    . docusate sodium (STOOL SOFTENER LAXATIVE) 100 MG capsule Take 100 mg by mouth 2 (two) times daily.    . famotidine (PEPCID) 10 MG  tablet Take 10 mg by mouth 2 (two) times daily. PRN    . fluticasone-salmeterol (ADVAIR HFA) 115-21 MCG/ACT inhaler Inhale 2 puffs into the lungs 2 (two) times daily. 3 Inhaler 0  . hydrochlorothiazide (HYDRODIURIL) 25 MG tablet Take 25 mg by mouth daily. Pt report taking 1/2 tab daily.    Marland Kitchen lisinopril (PRINIVIL,ZESTRIL) 40 MG tablet Take 40 mg by mouth daily.    . Multiple Vitamins-Minerals (MULTIVITAL PO) Take 1 tablet by mouth daily.     . Probiotic Product (PROBIOTIC DAILY PO) Take 1 tablet by mouth daily.     . simvastatin (ZOCOR) 20 MG tablet Take 1 tablet (20 mg total) by mouth daily. 90 tablet 0  . SPIRIVA RESPIMAT 2.5 MCG/ACT AERS Inhale 2 puffs into the lungs daily. 3 Inhaler 0   No facility-administered medications prior to visit.     ROS See HPI  Objective:  BP (!) 144/78   Pulse 88   Temp 99.7 F (37.6 C) (Oral)   Ht 5' 2.75" (1.594 m)   Wt 147 lb 12.8 oz (67 kg)   SpO2 97%   BMI 26.39 kg/m   BP Readings from Last 3 Encounters:  09/08/18 (!) 144/78  08/27/18 122/86  06/22/18 120/62    Wt Readings from Last 3 Encounters:  09/08/18 147 lb 12.8 oz (67 kg)  08/27/18 146 lb 9.6 oz (66.5 kg)  06/22/18 140 lb (63.5 kg)    Physical Exam HENT:     Head:   Cardiovascular:  Rate and Rhythm: Normal rate and regular rhythm.     Pulses: Normal pulses.     Heart sounds: Normal heart sounds.  Pulmonary:     Effort: Pulmonary effort is normal.     Breath sounds: Normal breath sounds.  Musculoskeletal:     Right wrist: Normal.     Left wrist: Normal.     Right hand: She exhibits decreased range of motion. She exhibits no bony tenderness. Normal sensation noted. Normal strength noted.     Left hand: She exhibits decreased range of motion. She exhibits no swelling. Normal sensation noted. Normal strength noted.     Right lower leg: No edema.     Left lower leg: No edema.     Comments: Heberden and bouchard nodes in bilateral fingers  Skin:    Findings: Lesion  present.  Neurological:     Mental Status: She is alert and oriented to person, place, and time.     Motor: Tremor present.     Comments: Right hand resting tremor  Psychiatric:        Mood and Affect: Mood normal.        Behavior: Behavior normal.        Thought Content: Thought content normal.    Assessment & Plan:   Allesha was seen today for follow-up.  Diagnoses and all orders for this visit:  HTN (hypertension), benign -     Basic metabolic panel -     TSH  Mixed hyperlipidemia -     Hepatic function panel -     Lipid panel  Facial lesion -     Ambulatory referral to Dermatology  Cataract of both eyes, unspecified cataract type -     Ambulatory referral to Ophthalmology  Primary osteoarthritis of both hands  Resting tremor   I am having [name] Canevari maintain her Multiple Vitamins-Minerals (MULTIVITAL PO), CALCIUM PO, Probiotic Product (PROBIOTIC DAILY PO), hydrochlorothiazide, lisinopril, famotidine, docusate sodium, conjugated estrogens, albuterol, amLODipine, simvastatin, Spiriva Respimat, and fluticasone-salmeterol.  No orders of the defined types were placed in this encounter.   Problem List Items Addressed This Visit      Cardiovascular and Mediastinum   HTN (hypertension), benign - Primary    Stable with amlodipine, lisinopril, and HCTZ BP Readings from Last 3 Encounters:  09/08/18 (!) 144/78  08/27/18 122/86  06/22/18 120/62        Relevant Orders   Basic metabolic panel (Completed)   TSH (Completed)     Other   Hyperlipidemia   Relevant Orders   Hepatic function panel (Completed)   Lipid panel (Completed)   Resting tremor    Chronic, onset over 1year ago, intermittent in past but now seems persistent in last 38month. No FHx of neuromuscular disorder of parkinsons' dx       Other Visit Diagnoses    Facial lesion       Relevant Orders   Ambulatory referral to Dermatology   Cataract of both eyes, unspecified cataract type        Relevant Orders   Ambulatory referral to Ophthalmology   Primary osteoarthritis of both hands           Follow-up: Return in about 6 months (around 03/10/2019) for hyperlipidemia and HTN.  Wilfred Lacy, NP

## 2018-09-08 NOTE — Patient Instructions (Addendum)
Stable lab results.  Maintain current medications Let me know when you need refills  Continue biofreeze and warm compress for finger stiffness.  You will be contacted to schedule appt with ophthalmology and dermatology.

## 2018-09-09 ENCOUNTER — Encounter: Payer: Self-pay | Admitting: Nurse Practitioner

## 2018-09-09 ENCOUNTER — Telehealth: Payer: Self-pay | Admitting: Nurse Practitioner

## 2018-09-09 NOTE — Telephone Encounter (Signed)
Pt dropped off paperwork for Baldo Ash to fill out in a yellow folder, I put in Charlottes folder in front

## 2018-09-17 ENCOUNTER — Institutional Professional Consult (permissible substitution): Payer: Medicare Other | Admitting: Emergency Medicine

## 2018-09-24 ENCOUNTER — Encounter: Payer: Self-pay | Admitting: Nurse Practitioner

## 2018-09-24 DIAGNOSIS — I10 Essential (primary) hypertension: Secondary | ICD-10-CM

## 2018-09-24 MED ORDER — LISINOPRIL 40 MG PO TABS: 40.0000 mg | ORAL_TABLET | Freq: Every day | ORAL | 1 refills | Status: DC

## 2018-09-30 ENCOUNTER — Ambulatory Visit (INDEPENDENT_AMBULATORY_CARE_PROVIDER_SITE_OTHER): Payer: Medicare Other | Admitting: *Deleted

## 2018-09-30 DIAGNOSIS — R55 Syncope and collapse: Secondary | ICD-10-CM

## 2018-09-30 LAB — CUP PACEART REMOTE DEVICE CHECK
Date Time Interrogation Session: 20200715141121
Implantable Pulse Generator Implant Date: 20190109

## 2018-10-02 ENCOUNTER — Encounter: Payer: Self-pay | Admitting: Nurse Practitioner

## 2018-10-02 DIAGNOSIS — J441 Chronic obstructive pulmonary disease with (acute) exacerbation: Secondary | ICD-10-CM

## 2018-10-02 DIAGNOSIS — R509 Fever, unspecified: Secondary | ICD-10-CM

## 2018-10-02 MED ORDER — ALBUTEROL SULFATE (2.5 MG/3ML) 0.083% IN NEBU: 2.5000 mg | INHALATION_SOLUTION | Freq: Four times a day (QID) | RESPIRATORY_TRACT | 0 refills | Status: DC | PRN

## 2018-10-05 ENCOUNTER — Telehealth: Payer: Self-pay | Admitting: Nurse Practitioner

## 2018-10-05 NOTE — Telephone Encounter (Signed)
Spoke with [employer], gave dx code.    Copied from [place] [phone]. Topic: General - Other >> Oct 05, 2018 10:00 AM Parke Poisson wrote: Reason for CRM: Fafi from [facility] called that they will need diagnosis codes for the reason albuterol (PROVENTIL) (2.5 MG/3ML) 0.083% nebulizer solution was prescribed

## 2018-10-09 NOTE — Progress Notes (Signed)
Carelink Summary Report / Loop Recorder 

## 2018-10-13 ENCOUNTER — Other Ambulatory Visit: Payer: Self-pay | Admitting: Nurse Practitioner

## 2018-10-13 MED ORDER — SIMVASTATIN 20 MG PO TABS: 20.0000 mg | ORAL_TABLET | Freq: Every day | ORAL | 1 refills | Status: DC

## 2018-10-13 NOTE — Telephone Encounter (Signed)
Due for follow up in 02/2019.

## 2018-11-02 ENCOUNTER — Ambulatory Visit (INDEPENDENT_AMBULATORY_CARE_PROVIDER_SITE_OTHER): Payer: Medicare Other | Admitting: *Deleted

## 2018-11-02 DIAGNOSIS — R55 Syncope and collapse: Secondary | ICD-10-CM

## 2018-11-02 LAB — CUP PACEART REMOTE DEVICE CHECK
Date Time Interrogation Session: 20200817134410
Implantable Pulse Generator Implant Date: 20190109

## 2018-11-10 NOTE — Progress Notes (Signed)
Carelink Summary Report / Loop Recorder 

## 2018-11-12 DIAGNOSIS — R509 Fever, unspecified: Secondary | ICD-10-CM

## 2018-11-12 DIAGNOSIS — J441 Chronic obstructive pulmonary disease with (acute) exacerbation: Secondary | ICD-10-CM

## 2018-11-12 MED ORDER — ALBUTEROL SULFATE (2.5 MG/3ML) 0.083% IN NEBU: 2.5000 mg | INHALATION_SOLUTION | Freq: Four times a day (QID) | RESPIRATORY_TRACT | 2 refills | Status: DC | PRN

## 2018-11-12 MED ORDER — SPIRIVA RESPIMAT 2.5 MCG/ACT IN AERS: 2.0000 | INHALATION_SPRAY | Freq: Every day | RESPIRATORY_TRACT | 3 refills | Status: DC

## 2018-11-13 ENCOUNTER — Encounter: Payer: Self-pay | Admitting: Nurse Practitioner

## 2018-11-13 DIAGNOSIS — R509 Fever, unspecified: Secondary | ICD-10-CM

## 2018-11-13 DIAGNOSIS — J441 Chronic obstructive pulmonary disease with (acute) exacerbation: Secondary | ICD-10-CM

## 2018-11-13 MED ORDER — ALBUTEROL SULFATE (2.5 MG/3ML) 0.083% IN NEBU: 2.5000 mg | INHALATION_SOLUTION | Freq: Four times a day (QID) | RESPIRATORY_TRACT | 2 refills | Status: DC

## 2018-11-13 NOTE — Telephone Encounter (Signed)
Does her anti-itch cream have diphenhydramine or cortisone?

## 2018-11-13 NOTE — Telephone Encounter (Signed)
I don't think that would help too  much for the skin.  Would recommend using something more soothing like Aveeno bath treatment.

## 2018-11-13 NOTE — Telephone Encounter (Signed)
Lets have her try the fluocinonide over the weekend, f/u if not improving by next week.

## 2018-11-18 ENCOUNTER — Telehealth: Payer: Self-pay | Admitting: Nurse Practitioner

## 2018-11-18 NOTE — Telephone Encounter (Signed)

## 2018-11-19 ENCOUNTER — Ambulatory Visit (INDEPENDENT_AMBULATORY_CARE_PROVIDER_SITE_OTHER): Payer: Medicare Other

## 2018-11-19 DIAGNOSIS — Z23 Encounter for immunization: Secondary | ICD-10-CM

## 2018-11-19 NOTE — Progress Notes (Signed)
Pt came in to get high dose flu shot today. Given injection into the right deltoid, pt tolerate injection well. Information given to the pt.

## 2018-11-27 ENCOUNTER — Encounter: Payer: Self-pay | Admitting: Nurse Practitioner

## 2018-11-27 ENCOUNTER — Other Ambulatory Visit: Payer: Self-pay

## 2018-11-27 ENCOUNTER — Ambulatory Visit (INDEPENDENT_AMBULATORY_CARE_PROVIDER_SITE_OTHER): Payer: Medicare Other | Admitting: Nurse Practitioner

## 2018-11-27 VITALS — BP 150/76 | HR 73 | Temp 97.7°F | Ht 62.75 in | Wt 147.2 lb

## 2018-11-27 DIAGNOSIS — R21 Rash and other nonspecific skin eruption: Secondary | ICD-10-CM

## 2018-11-27 MED ORDER — HYDROCORTISONE 1 % EX CREA: 1.0000 "application " | TOPICAL_CREAM | Freq: Two times a day (BID) | CUTANEOUS | 0 refills | Status: DC

## 2018-11-27 NOTE — Patient Instructions (Signed)
Rash, Adult  A rash is a change in the color of your skin. A rash can also change the way your skin feels. There are many different conditions and factors that can cause a rash. Follow these instructions at home: The goal of treatment is to stop the itching and keep the rash from spreading. Watch for any changes in your symptoms. Let your doctor know about them. Follow these instructions to help with your condition: Medicine Take or apply over-the-counter and prescription medicines only as told by your doctor. These may include medicines:  To treat red or swollen skin (corticosteroid creams).  To treat itching.  To treat an allergy (oral antihistamines).  To treat very bad symptoms (oral corticosteroids).  Skin care  Put cool cloths (compresses) on the affected areas.  Do not scratch or rub your skin.  Avoid covering the rash. Make sure that the rash is exposed to air as much as possible. Managing itching and discomfort  Avoid hot showers or baths. These can make itching worse. A cold shower may help.  Try taking a bath with: ? Epsom salts. You can get these at your local pharmacy or grocery store. Follow the instructions on the package. ? Baking soda. Pour a small amount into the bath as told by your doctor. ? Colloidal oatmeal. You can get this at your local pharmacy or grocery store. Follow the instructions on the package.  Try putting baking soda paste onto your skin. Stir water into baking soda until it gets like a paste.  Try putting on a lotion that relieves itchiness (calamine lotion).  Keep cool and out of the sun. Sweating and being hot can make itching worse. General instructions   Rest as needed.  Drink enough fluid to keep your pee (urine) pale yellow.  Wear loose-fitting clothing.  Avoid scented soaps, detergents, and perfumes. Use gentle soaps, detergents, perfumes, and other cosmetic products.  Avoid anything that causes your rash. Keep a journal to  help track what causes your rash. Write down: ? What you eat. ? What cosmetic products you use. ? What you drink. ? What you wear. This includes jewelry.  Keep all follow-up visits as told by your doctor. This is important. Contact a doctor if:  You sweat at night.  You lose weight.  You pee (urinate) more than normal.  You pee less than normal, or you notice that your pee is a darker color than normal.  You feel weak.  You throw up (vomit).  Your skin or the whites of your eyes look yellow (jaundice).  Your skin: ? Tingles. ? Is numb.  Your rash: ? Does not go away after a few days. ? Gets worse.  You are: ? More thirsty than normal. ? More tired than normal.  You have: ? New symptoms. ? Pain in your belly (abdomen). ? A fever. ? Watery poop (diarrhea). Get help right away if:  You have a fever and your symptoms suddenly get worse.  You start to feel mixed up (confused).  You have a very bad headache or a stiff neck.  You have very bad joint pains or stiffness.  You have jerky movements that you cannot control (seizure).  Your rash covers all or most of your body. The rash may or may not be painful.  You have blisters that: ? Are on top of the rash. ? Grow larger. ? Grow together. ? Are painful. ? Are inside your nose or mouth.  You have a rash   that: ? Looks like purple pinprick-sized spots all over your body. ? Has a "bull's eye" or looks like a target. ? Is red and painful, causes your skin to peel, and is not from being in the sun too long. Summary  A rash is a change in the color of your skin. A rash can also change the way your skin feels.  The goal of treatment is to stop the itching and keep the rash from spreading.  Take or apply over-the-counter and prescription medicines only as told by your doctor.  Contact a doctor if you have new symptoms or symptoms that get worse.  Keep all follow-up visits as told by your doctor. This is  important. This information is not intended to replace advice given to you by your health care provider. Make sure you discuss any questions you have with your health care provider. Document Released: 08/21/2007 Document Revised: 06/26/2018 Document Reviewed: 10/06/2017 Elsevier Patient Education  2020 [employer] Inc.  

## 2018-11-30 ENCOUNTER — Encounter: Payer: Self-pay | Admitting: Nurse Practitioner

## 2018-11-30 NOTE — Progress Notes (Signed)
Subjective:  Patient ID: Kristen Suarez, female    DOB: 03-Jul-1945  Age: 73 y.o. MRN: [id number]  CC: Insect Bite (pt is c/o of bite on legs and arms/painful,itchy,red,swelling/2 wks ago/used cortison cream/)  Rash This is a new problem. The current episode started in the past 7 days. The problem has been waxing and waning since onset. The affected locations include the left arm and right upper leg. The rash is characterized by itchiness, redness and pain. It is unknown if there was an exposure to a precipitant. Pertinent negatives include no congestion, cough, facial edema, fatigue, fever, joint pain, shortness of breath or sore throat. Past treatments include topical steroids and antibiotic cream. The treatment provided significant relief. There is no history of allergies, asthma, eczema or varicella.   Reviewed past Medical, Social and Family history today.  Outpatient Medications Prior to Visit  Medication Sig Dispense Refill  . albuterol (PROVENTIL) (2.5 MG/3ML) 0.083% nebulizer solution Take 3 mLs (2.5 mg total) by nebulization every 6 (six) hours. 150 mL 2  . amLODipine (NORVASC) 5 MG tablet Take 1 tablet (5 mg total) by mouth daily. 90 tablet 1  . CALCIUM PO Take 1 tablet by mouth daily.     Marland Kitchen conjugated estrogens (PREMARIN) vaginal cream Place 1 Applicatorful vaginally daily. 1 gram 2 times a week.    . docusate sodium (STOOL SOFTENER LAXATIVE) 100 MG capsule Take 100 mg by mouth 2 (two) times daily.    . famotidine (PEPCID) 10 MG tablet Take 10 mg by mouth 2 (two) times daily. PRN    . fluticasone-salmeterol (ADVAIR HFA) 115-21 MCG/ACT inhaler Inhale 2 puffs into the lungs 2 (two) times daily. 3 Inhaler 0  . hydrochlorothiazide (HYDRODIURIL) 25 MG tablet Take 25 mg by mouth daily. Pt report taking 1/2 tab daily.    Marland Kitchen lisinopril (ZESTRIL) 40 MG tablet Take 1 tablet (40 mg total) by mouth daily. 90 tablet 1  . Multiple Vitamins-Minerals (MULTIVITAL PO) Take 1 tablet by mouth daily.      . Probiotic Product (PROBIOTIC DAILY PO) Take 1 tablet by mouth daily.     . simvastatin (ZOCOR) 20 MG tablet Take 1 tablet (20 mg total) by mouth daily. 90 tablet 1  . SPIRIVA RESPIMAT 2.5 MCG/ACT AERS Inhale 2 puffs into the lungs daily. 12 g 3   No facility-administered medications prior to visit.     ROS See HPI  Objective:  BP (!) 150/76   Pulse 73   Temp 97.7 F (36.5 C) (Tympanic)   Ht 5' 2.75" (1.594 m)   Wt 147 lb 3.2 oz (66.8 kg)   SpO2 97%   BMI 26.28 kg/m   BP Readings from Last 3 Encounters:  11/27/18 (!) 150/76  09/08/18 (!) 144/78  08/27/18 122/86    Wt Readings from Last 3 Encounters:  11/27/18 147 lb 3.2 oz (66.8 kg)  09/08/18 147 lb 12.8 oz (67 kg)  08/27/18 146 lb 9.6 oz (66.5 kg)    Physical Exam Vitals signs reviewed.  Cardiovascular:     Rate and Rhythm: Normal rate.     Pulses: Normal pulses.  Pulmonary:     Effort: Pulmonary effort is normal.  Musculoskeletal: Normal range of motion.        General: No tenderness.  Skin:    Findings: Rash present. No erythema. Rash is papular.       Neurological:     Mental Status: She is alert and oriented to person, place, and time.  Lab Results  Component Value Date   GLUCOSE 94 09/08/2018   CHOL 191 09/08/2018   TRIG 135.0 09/08/2018   HDL 53.30 09/08/2018   LDLCALC 110 (H) 09/08/2018   ALT 21 09/08/2018   AST 19 09/08/2018   NA 137 09/08/2018   K 4.0 09/08/2018   CL 99 09/08/2018   CREATININE 0.96 09/08/2018   BUN 23 09/08/2018   CO2 31 09/08/2018   TSH 1.42 09/08/2018    Assessment & Plan:   Jahanna was seen today for insect bite.  Diagnoses and all orders for this visit:  Papular rash, localized -     hydrocortisone cream 1 %; Apply 1 application topically 2 (two) times daily.   I am having [name] Huckaba start on hydrocortisone cream. I am also having her maintain her Multiple Vitamins-Minerals (MULTIVITAL PO), CALCIUM PO, Probiotic Product (PROBIOTIC DAILY PO),  hydrochlorothiazide, famotidine, docusate sodium, conjugated estrogens, amLODipine, fluticasone-salmeterol, lisinopril, simvastatin, Spiriva Respimat, and albuterol.  Meds ordered this encounter  Medications  . hydrocortisone cream 1 %    Sig: Apply 1 application topically 2 (two) times daily.    Dispense:  30 g    Refill:  0    Order Specific Question:   Supervising Provider    Answer:   MATTHEWS, CODY [[id number]]    Problem List Items Addressed This Visit    None    Visit Diagnoses    Papular rash, localized    -  Primary   Relevant Medications   hydrocortisone cream 1 %       Follow-up: Return if symptoms worsen or fail to improve.  Wilfred Lacy, NP

## 2018-12-06 LAB — CUP PACEART REMOTE DEVICE CHECK
Date Time Interrogation Session: 20200919144010
Implantable Pulse Generator Implant Date: 20190109

## 2018-12-07 ENCOUNTER — Telehealth: Payer: Self-pay | Admitting: Emergency Medicine

## 2018-12-07 ENCOUNTER — Ambulatory Visit (INDEPENDENT_AMBULATORY_CARE_PROVIDER_SITE_OTHER): Payer: Medicare Other | Admitting: *Deleted

## 2018-12-07 DIAGNOSIS — R55 Syncope and collapse: Secondary | ICD-10-CM

## 2018-12-07 DIAGNOSIS — D869 Sarcoidosis, unspecified: Secondary | ICD-10-CM

## 2018-12-07 NOTE — Telephone Encounter (Signed)
Call made to patient, made aware we will reach to RB to have him sign off on order and afterwards someone from our office would contact her to schedule the CT. Voiced understanding. Aware RB returns to clinic tomorrow.    RB please advise, CT with or without contrast, and what diagnosis. Thanks.

## 2018-12-07 NOTE — Telephone Encounter (Signed)
Attempted to call patient, no answer, left message for patient to call back to schedule.

## 2018-12-07 NOTE — Telephone Encounter (Signed)
I don't see that there was an order placed for this  Patient to do a CT. In Dr. Agustina Caroli note from 08/27/18 it does state to do PFT and CT. We will need a CT order placed for [place] to schedule

## 2018-12-09 NOTE — Telephone Encounter (Signed)
I would like for her to have a CT chest without contrast in October for sarcoidosis. Thanbks.

## 2018-12-09 NOTE — Telephone Encounter (Signed)
Order placed for CT chest without contrast per Dr. Lamonte Sakai. Nothing further needed at this time.

## 2018-12-14 NOTE — Progress Notes (Signed)
Carelink Summary Report / Loop Recorder 

## 2018-12-16 ENCOUNTER — Encounter: Payer: Self-pay | Admitting: Internal Medicine

## 2018-12-16 ENCOUNTER — Other Ambulatory Visit: Payer: Self-pay

## 2018-12-16 ENCOUNTER — Ambulatory Visit (INDEPENDENT_AMBULATORY_CARE_PROVIDER_SITE_OTHER): Payer: Medicare Other | Admitting: Internal Medicine

## 2018-12-16 VITALS — BP 126/82 | HR 79 | Ht 62.75 in | Wt 147.6 lb

## 2018-12-16 DIAGNOSIS — I10 Essential (primary) hypertension: Secondary | ICD-10-CM

## 2018-12-16 DIAGNOSIS — R55 Syncope and collapse: Secondary | ICD-10-CM | POA: Diagnosis not present

## 2018-12-16 DIAGNOSIS — R0600 Dyspnea, unspecified: Secondary | ICD-10-CM | POA: Diagnosis not present

## 2018-12-16 MED ORDER — AMLODIPINE BESYLATE 2.5 MG PO TABS: 2.5000 mg | ORAL_TABLET | Freq: Every day | ORAL | 3 refills | Status: DC

## 2018-12-16 NOTE — Progress Notes (Signed)
Patient Care Team: Nche, [name], NP as PCP - General (Internal Medicine)   HPI  Kristen Suarez is a 73 y.o. 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  Blood pressures have been 110 at home.  Some dyspnea on exertion but this is been chronic.some edema  But mostly unilateral  Records and Results Reviewed   Past Medical History:  Diagnosis Date  . Arthritis   . Asthma   . Chickenpox   . Colon polyps 2010  . Heart murmur   . History of fainting spells of unknown cause   . Hyperlipidemia   . Hypertension   . Sarcoid 06/16/1976    Past Surgical History:  Procedure Laterality Date  . ABDOMINAL HYSTERECTOMY  1987  . LUNG BIOPSY  06/16/1976    Current Meds  Medication Sig  . albuterol (PROVENTIL) (2.5 MG/3ML) 0.083% nebulizer solution Take 3 mLs (2.5 mg total) by nebulization every 6 (six) hours.  Marland Kitchen amLODipine (NORVASC) 5 MG tablet Take 1 tablet (5 mg total) by mouth daily.  Marland Kitchen CALCIUM PO Take 1 tablet by mouth daily.   Marland Kitchen conjugated estrogens (PREMARIN) vaginal cream Place 1 Applicatorful vaginally daily. 1 gram 2 times a week.  . docusate sodium (STOOL SOFTENER LAXATIVE) 100 MG capsule Take 100 mg by mouth 2 (two) times daily.  . famotidine (PEPCID) 10 MG tablet Take 10 mg by mouth 2 (two) times daily. PRN  . fluticasone-salmeterol (ADVAIR HFA) 115-21 MCG/ACT inhaler Inhale 2 puffs into the lungs 2 (two) times daily.  . hydrochlorothiazide (HYDRODIURIL) 25 MG tablet Take 12.5 mg by mouth daily.  . hydrocortisone cream 1 % Apply 1 application topically 2 (two) times daily.  Marland Kitchen lisinopril (ZESTRIL) 40 MG tablet Take 1 tablet (40 mg total) by mouth daily.  . Multiple Vitamins-Minerals (MULTIVITAL PO) Take 1 tablet by mouth daily.   . Probiotic Product (PROBIOTIC  DAILY PO) Take 1 tablet by mouth daily.   . simvastatin (ZOCOR) 20 MG tablet Take 1 tablet (20 mg total) by mouth daily.  Marland Kitchen SPIRIVA RESPIMAT 2.5 MCG/ACT AERS Inhale 2 puffs into the lungs daily.    Allergies  Allergen Reactions  . Penicillins Itching  . Sulfa Antibiotics Itching  . Keflex [Cephalexin] Rash      Review of Systems negative except from HPI and PMH  Physical Exam BP 126/82   Pulse 79   Ht 5' 2.75" (1.594 m)   Wt 147 lb 9.6 oz (67 kg)   SpO2 97%   BMI 26.35 kg/m  Well developed and well nourished in no acute distress HENT normal E scleral and icterus clear Neck Supple JVP flat; carotids brisk and full Clear to ausculation Regular rate and rhythm, no murmurs gallops or rub Soft with active bowel sounds No clubbing cyanosis TR R Edema Alert and oriented, grossly normal motor and sensory function Skin Warm and Dry  ECG sinus @ 79 15/08/38   Assessment and  Plan  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  We will start by decreasing amlodipine to 5--2.5.  With her blood pressure being  relatively low and with her presumed mechanism of syncope being neurally mediated would favor and have recommended that we discontinue her diuretic.  She will let us know in the next couple of weeks how her blood pressures are faring With these changes and what has happened to her edema.   Atypical chest pains are fleeting.  Not likely cardiac Current medicines are reviewed at length with the patient today .  The patient does not  have concerns regarding medicines.  We spent more than 50% of our >25 min visit in face to face counseling regarding the above   We spent more than 50% of our >25 min visit in face to face  counseling regarding the above

## 2018-12-16 NOTE — Patient Instructions (Signed)
Medication Instructions:  Your physician has recommended you make the following change in your medication:  1. STOP Hydrochlorothiazide 2. DECREASE Amlodipine (Norvasc) 2.5 mg once a day  *If you need a refill on your cardiac medications before your next appointment, please call your pharmacy*  Labwork: None ordered  Testing/Procedures: Your physician has requested that you have an echocardiogram. Echocardiography is a painless test that uses sound waves to create images of your heart. It provides your doctor with information about the size and shape of your heart and how well your heart's chambers and valves are working. This procedure takes approximately one hour. There are no restrictions for this procedure.  Follow-Up: Remote monitoring is used to monitor your Pacemaker or ICD from home. This monitoring reduces the number of office visits required to check your device to one time per year. It allows [place] to keep an eye on the functioning of your device to ensure it is working properly. You are scheduled for a device check from home on 01/07/19. You may send your transmission at any time that day. If you have a wireless device, the transmission will be sent automatically. After your physician reviews your transmission, you will receive a postcard with your next transmission date.  Your physician wants you to follow-up in: 1 year with Dr. Caryl Comes.  You will receive a reminder letter in the mail two months in advance. If you don't receive a letter, please call our office to schedule the follow-up appointment.  Thank you for choosing [facility]!!   Trinidad Curet, RN [phone]  Any Other Special Instructions Will Be Listed Below (If Applicable).

## 2018-12-21 ENCOUNTER — Other Ambulatory Visit: Payer: Self-pay | Admitting: Emergency Medicine

## 2018-12-28 ENCOUNTER — Ambulatory Visit: Payer: Medicare Other | Admitting: Emergency Medicine

## 2018-12-28 ENCOUNTER — Other Ambulatory Visit (HOSPITAL_COMMUNITY): Payer: Medicare Other

## 2018-12-28 ENCOUNTER — Other Ambulatory Visit (HOSPITAL_COMMUNITY)
Admission: RE | Admit: 2018-12-28 | Discharge: 2018-12-28 | Disposition: A | Discharge status: Home / Self Care | Payer: Medicare Other | Source: Ambulatory Visit | Attending: Emergency Medicine | Admitting: Emergency Medicine

## 2018-12-28 DIAGNOSIS — Z01812 Encounter for preprocedural laboratory examination: Secondary | ICD-10-CM | POA: Diagnosis present

## 2018-12-28 DIAGNOSIS — Z20828 Contact with and (suspected) exposure to other viral communicable diseases: Secondary | ICD-10-CM | POA: Diagnosis present

## 2018-12-28 LAB — SARS CORONAVIRUS 2 (TAT 6-24 HRS): SARS Coronavirus 2: NEGATIVE

## 2018-12-29 ENCOUNTER — Other Ambulatory Visit: Payer: Self-pay | Admitting: *Deleted

## 2018-12-29 ENCOUNTER — Telehealth: Payer: Self-pay | Admitting: Emergency Medicine

## 2018-12-29 DIAGNOSIS — D869 Sarcoidosis, unspecified: Secondary | ICD-10-CM

## 2018-12-29 NOTE — Telephone Encounter (Signed)
LMTCB for Stacy 

## 2018-12-30 ENCOUNTER — Other Ambulatory Visit: Payer: Self-pay

## 2018-12-30 ENCOUNTER — Ambulatory Visit: Payer: Medicare Other | Admitting: Emergency Medicine

## 2018-12-30 DIAGNOSIS — D869 Sarcoidosis, unspecified: Secondary | ICD-10-CM

## 2018-12-30 LAB — PULMONARY FUNCTION TEST
DL/VA % pred: 111 %
DL/VA: 4.57 ml/min/mmHg/L
DLCO unc % pred: 60 %
DLCO unc: 11.77 ml/min/mmHg
FEF 25-75 Post: 0.73 L/sec
FEF 25-75 Pre: 0.66 L/sec
FEF2575-%Change-Post: 9 %
FEF2575-%Pred-Post: 40 %
FEF2575-%Pred-Pre: 37 %
FEV1-%Change-Post: 0 %
FEV1-%Pred-Post: 46 %
FEV1-%Pred-Pre: 46 %
FEV1-Post: 1.03 L
FEV1-Pre: 1.03 L
FEV1FVC-%Change-Post: 0 %
FEV1FVC-%Pred-Pre: 92 %
FEV6-%Change-Post: 0 %
FEV6-%Pred-Post: 52 %
FEV6-%Pred-Pre: 52 %
FEV6-Post: 1.49 L
FEV6-Pre: 1.48 L
FEV6FVC-%Pred-Post: 104 %
FEV6FVC-%Pred-Pre: 104 %
FVC-%Change-Post: 0 %
FVC-%Pred-Post: 50 %
FVC-%Pred-Pre: 50 %
FVC-Post: 1.49 L
FVC-Pre: 1.48 L
Post FEV1/FVC ratio: 69 %
Post FEV6/FVC ratio: 100 %
Pre FEV1/FVC ratio: 69 %
Pre FEV6/FVC Ratio: 100 %
RV % pred: 91 %
RV: 2.08 L
TLC % pred: 71 %
TLC: 3.67 L

## 2018-12-30 NOTE — Telephone Encounter (Signed)
Spoke with [employer]. Nothing further is needed.

## 2019-01-01 ENCOUNTER — Other Ambulatory Visit: Payer: Self-pay

## 2019-01-01 ENCOUNTER — Ambulatory Visit (INDEPENDENT_AMBULATORY_CARE_PROVIDER_SITE_OTHER)
Admission: RE | Admit: 2019-01-01 | Discharge: 2019-01-01 | Disposition: A | Discharge status: Home / Self Care | Payer: Medicare Other | Source: Ambulatory Visit | Attending: Emergency Medicine | Admitting: Emergency Medicine

## 2019-01-01 ENCOUNTER — Ambulatory Visit (HOSPITAL_COMMUNITY): Payer: Medicare Other | Attending: Internal Medicine

## 2019-01-01 DIAGNOSIS — D869 Sarcoidosis, unspecified: Secondary | ICD-10-CM

## 2019-01-01 DIAGNOSIS — R55 Syncope and collapse: Secondary | ICD-10-CM

## 2019-01-01 DIAGNOSIS — R0600 Dyspnea, unspecified: Secondary | ICD-10-CM | POA: Diagnosis present

## 2019-01-04 ENCOUNTER — Ambulatory Visit (INDEPENDENT_AMBULATORY_CARE_PROVIDER_SITE_OTHER): Payer: Medicare Other | Admitting: Emergency Medicine

## 2019-01-04 ENCOUNTER — Other Ambulatory Visit: Payer: Self-pay

## 2019-01-04 ENCOUNTER — Encounter: Payer: Self-pay | Admitting: Emergency Medicine

## 2019-01-04 DIAGNOSIS — J42 Unspecified chronic bronchitis: Secondary | ICD-10-CM | POA: Diagnosis not present

## 2019-01-04 DIAGNOSIS — D869 Sarcoidosis, unspecified: Secondary | ICD-10-CM | POA: Diagnosis not present

## 2019-01-04 NOTE — Patient Instructions (Signed)
Please continue Advair and Spiriva as you have been taking them. Keep your albuterol available use 2 puffs if needed for shortness of breath, chest tightness, wheezing. We will plan to repeat your CT scan of the chest in October 2021 to follow for stability. Follow with Dr Lamonte Sakai in 6 months or sooner if you have any problems

## 2019-01-04 NOTE — Assessment & Plan Note (Signed)
With associated medial bilateral scarring.  No evidence of active groundglass.  We need to follow her CT in 1 year for interval change.

## 2019-01-04 NOTE — Assessment & Plan Note (Signed)
Stable disease on her current bronchodilator regimen.  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  For now we have agreed to continue her Spiriva, Advair, albuterol as needed.  We may decide to change her long-acting bronchodilators at some point going forward.

## 2019-01-04 NOTE — Progress Notes (Signed)
Subjective:    Patient ID: Kristen Suarez, female    DOB: 21-Sep-1945, 73 y.o.   MRN: [id number]  HPI 73 year old never smoker with a history of hyperlipidemia, hypertension (on lisinopril), long-standing syncope / fainting spells. She also carries a history of sarcoidosis (R lung bx 1978) and asthma / COPD.  Treated at least once with an extended course prednisone. Otherwise has had flares with short courses > about once a year.   Currently managed on Spiriva and Advair, has been on this medication since 2017 (had cough w Breo).  She also uses albuterol nebs, at least once daily, usually in the morning. She isn't sure that these help her.  She reports flu-like sx in early April, characterized by fatigue, persistent cough prod of mucous, wheeze.  She had some exertional dyspnea, happens w bending. Works in the garden, sometimes stops to stand. Occasional cough, probably every day. Non-productive.  She underwent a loop recorder telemetry Feb through May > no evidence tachy, brady or A Fib. ECG also normal.   ROV 01/04/2019 --this is a follow-up visit for patient with a history of sarcoidosis with associated obstructive lung disease.  She has been managed on Spiriva and Advair.  We repeated her pulmonary function testing this month and I have reviewed.  This shows severe obstruction with superimposed restriction, restriction confirmed on lung volumes, decreased diffusion capacity that corrects to the normal range when adjusted for alveolar volume.  A CT scan of her chest done on 01/01/2019 reviewed by me, shows bilateral apical scar, and B mid-lung lobe fibrotic change with volume loss, traction bronchiectasis.    Review of Systems  Constitutional: Positive for unexpected weight change. Negative for fever.  HENT: Negative for congestion, dental problem, ear pain, nosebleeds, postnasal drip, rhinorrhea, sinus pressure, sneezing, sore throat and trouble swallowing.   Eyes: Negative for redness and  itching.  Respiratory: Positive for cough and shortness of breath. Negative for chest tightness and wheezing.   Cardiovascular: Negative for palpitations and leg swelling.  Gastrointestinal: Negative for nausea and vomiting.  Genitourinary: Negative for dysuria.  Musculoskeletal: Negative for joint swelling.  Skin: Negative for rash.  Neurological: Negative for headaches.  Hematological: Does not bruise/bleed easily.  Psychiatric/Behavioral: Negative for dysphoric mood. The patient is nervous/anxious.        Objective:   Physical Exam Vitals:   01/04/19 0937  BP: 134/90  Pulse: 82  SpO2: 100%  Weight: 148 lb 9.6 oz (67.4 kg)  Height: 5' 3.75" (1.619 m)   Gen: Pleasant, well-nourished, in no distress,  normal affect  ENT: No lesions,  mouth clear,  oropharynx clear, no postnasal drip  Neck: No JVD, no stridor  Lungs: No use of accessory muscles, some mild expiratory squeaks, right upper lobe inspiratory crackles, no overt wheezing  Cardiovascular: RRR, heart sounds normal, no murmur or gallops, no peripheral edema  Musculoskeletal: No deformities, no cyanosis or clubbing  Neuro: alert, awake, non focal  Skin: Warm, no lesions or rash      Assessment & Plan:  COPD (chronic obstructive pulmonary disease) (HCC) Stable disease on her current bronchodilator regimen.  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  For now we have agreed to continue her Spiriva, Advair, albuterol as needed.  We may decide to change her long-acting bronchodilators at some point going forward.  Sarcoidosis With associated medial bilateral scarring.  No evidence of active groundglass.  We need to follow her CT in 1 year for interval change.  Baltazar Apo, MD, PhD  01/04/2019, 5:20 PM [facility] [phone] or if no answer [phone]

## 2019-01-05 ENCOUNTER — Encounter: Payer: Self-pay | Admitting: Obstetrics and Gynecology

## 2019-01-05 ENCOUNTER — Ambulatory Visit (INDEPENDENT_AMBULATORY_CARE_PROVIDER_SITE_OTHER): Payer: Medicare Other | Admitting: Obstetrics and Gynecology

## 2019-01-05 ENCOUNTER — Other Ambulatory Visit: Payer: Self-pay

## 2019-01-05 ENCOUNTER — Other Ambulatory Visit: Payer: Self-pay | Admitting: *Deleted

## 2019-01-05 VITALS — BP 169/85 | HR 85 | Wt 149.3 lb

## 2019-01-05 DIAGNOSIS — N8111 Cystocele, midline: Secondary | ICD-10-CM | POA: Diagnosis present

## 2019-01-05 DIAGNOSIS — Z139 Encounter for screening, unspecified: Secondary | ICD-10-CM | POA: Diagnosis not present

## 2019-01-05 DIAGNOSIS — N952 Postmenopausal atrophic vaginitis: Secondary | ICD-10-CM

## 2019-01-05 DIAGNOSIS — Z1231 Encounter for screening mammogram for malignant neoplasm of breast: Secondary | ICD-10-CM | POA: Diagnosis not present

## 2019-01-05 DIAGNOSIS — J449 Chronic obstructive pulmonary disease, unspecified: Secondary | ICD-10-CM

## 2019-01-05 MED ORDER — FLUTICASONE-SALMETEROL 115-21 MCG/ACT IN AERO: 2.0000 | INHALATION_SPRAY | Freq: Two times a day (BID) | RESPIRATORY_TRACT | 1 refills | Status: DC

## 2019-01-06 NOTE — Progress Notes (Signed)
Ms Kristen Suarez presents with c/o pelvic relaxation. She recently moved to [facility] from [place]. This is a problem that she has been dealing with for several yrs. She has tried a pessary in the past but did not like due to vaginal discomfort She has also completed a course of GYN PT. She reports this greatly helped her relaxation. She continues to do the home exercises as instructed by GYN PT. She presently denies any stress or urge incont Sx. She has used Premarin vaginal cream in the past and wonders if she should restart. She is not sexual active She denies accidents and does not wear a pad. She voids @ 5 times during the day and 2-3 times at night. She denies excessive caffeine use.  She had a hyst @ 40 yrs ago.   Medical problems as listed in chart, managed by PCP   PE AF VSS Lungs clear Heart RRR Abd soft + BS GU nl EGBUS atrophic vagina, Grade 2 cystocele at rest, progresses to Grade 3 with valsalva, Q tip test deferred, levator muscles anntenuated bilaterally, cervix and uterus absent, no bladder tenderness or adnexal masses, perineal body @ 1 cm  A/P Pelvic relaxation        Vaginal atrophy  Pt wanted to know if additional GYN PT and continue use of Premarin vaginal cream would benefit her. I informed the pt that I can refer to GYN PT but uncertain if she would gain any additional benefit. She declines pessary and surgery. She has no frank Sx of incont so I am reluctant to start anticholinergic medication. Pt is interested in trying OTC vaginal moisture cream. She is going to give this a try and she how she does. Pt will call back if does like and we will restart her Premarin vaginal cream at 1 gm twice a week. F/U PRN

## 2019-01-07 ENCOUNTER — Ambulatory Visit (INDEPENDENT_AMBULATORY_CARE_PROVIDER_SITE_OTHER): Payer: Medicare Other | Admitting: *Deleted

## 2019-01-07 DIAGNOSIS — R55 Syncope and collapse: Secondary | ICD-10-CM | POA: Diagnosis not present

## 2019-01-07 LAB — CUP PACEART REMOTE DEVICE CHECK
Date Time Interrogation Session: 20201022140121
Implantable Pulse Generator Implant Date: 20190109

## 2019-01-18 ENCOUNTER — Other Ambulatory Visit: Payer: Self-pay | Admitting: *Deleted

## 2019-01-18 ENCOUNTER — Ambulatory Visit
Admission: RE | Admit: 2019-01-18 | Discharge: 2019-01-18 | Disposition: A | Discharge status: Home / Self Care | Payer: Self-pay | Source: Ambulatory Visit | Attending: Emergency Medicine | Admitting: Emergency Medicine

## 2019-01-18 DIAGNOSIS — J449 Chronic obstructive pulmonary disease, unspecified: Secondary | ICD-10-CM

## 2019-01-18 NOTE — Progress Notes (Signed)
Carelink Summary Report / Loop Recorder 

## 2019-01-19 ENCOUNTER — Ambulatory Visit
Admission: RE | Admit: 2019-01-19 | Discharge: 2019-01-19 | Disposition: A | Discharge status: Home / Self Care | Payer: Self-pay | Source: Ambulatory Visit | Attending: Emergency Medicine | Admitting: Emergency Medicine

## 2019-01-19 ENCOUNTER — Telehealth: Payer: Self-pay

## 2019-01-19 DIAGNOSIS — J42 Unspecified chronic bronchitis: Secondary | ICD-10-CM

## 2019-01-19 NOTE — Telephone Encounter (Signed)
I have placed the order per [facility].

## 2019-01-19 NOTE — Telephone Encounter (Signed)
-----   Message from Osvaldo Shipper, [place] sent at 01/19/2019 11:54 AM EST ----- Regarding: CT order Please place an outside order pt had 2 studies that where sent over to be loaded into our system from prior CTS.  MRN [id number] Name [place] Outside order [id number]  Thank you  Erline Levine

## 2019-01-26 NOTE — Progress Notes (Signed)
Virtual Visit via Video Note  I connected with patient on 01/27/19 at  9:45 AM EST by audio enabled telemedicine application and verified that I am speaking with the correct person using two identifiers.   THIS ENCOUNTER IS A VIRTUAL VISIT DUE TO COVID-19 - PATIENT WAS NOT SEEN IN THE OFFICE. PATIENT HAS CONSENTED TO VIRTUAL VISIT / TELEMEDICINE VISIT   Location of patient: home  Location of provider: office  I discussed the limitations of evaluation and management by telemedicine and the availability of in person appointments. The patient expressed understanding and agreed to proceed.   Subjective:   Kristen Suarez is a 73 y.o. female who presents for Medicare Annual  preventive examination.  The Patient was informed that the wellness visit is to identify future health risk and educate and initiate measures that can reduce risk for increased disease through the lifespan.   Describes health as fair, good or great?  Good  Review of Systems:  Home Safety/Smoke Alarms: Feels safe in home. Smoke alarms in place.  Lives with husband in 1 story home.   Female:    Mammo- scheduled 02/25/19       Dexa scan- declines at this time due to Covid 19        CCS- pt reports last done 02/2018. Reports her recall is 5 yrs.    Objective:     Vitals: Temp (!) 97.4 F (36.3 C) (Temporal) Comment: pt reported  SpO2 97% Comment: pt reported  There is no height or weight on file to calculate BMI.  Advanced Directives 01/27/2019  Does Patient Have a Medical Advance Directive? No  Would patient like information on creating a medical advance directive? No - Patient declined    Tobacco Social History   Tobacco Use  Smoking Status Never Smoker  Smokeless Tobacco Never Used     Counseling given: Not Answered   Clinical Intake: Pain : No/denies pain   Past Medical History:  Diagnosis Date  . Arthritis   . Asthma   . Chickenpox   . Colon polyps 2010  . Heart murmur   . History of  fainting spells of unknown cause   . Hyperlipidemia   . Hypertension   . Sarcoid 06/16/1976   Past Surgical History:  Procedure Laterality Date  . ABDOMINAL HYSTERECTOMY  1987  . LUNG BIOPSY  06/16/1976   Family History  Problem Relation Age of Onset  . Arthritis Mother   . Cancer Mother   . Depression Mother   . Heart attack Mother   . Hyperlipidemia Mother   . Hypertension Mother   . Alcohol abuse Father   . Heart disease Father   . Hypertension Father   . Hyperlipidemia Father   . Heart disease Sister   . Stroke Sister   . Alcohol abuse Brother   . Heart disease Brother   . Hypertension Brother   . Hyperlipidemia Brother   . Multiple sclerosis Daughter   . Hyperlipidemia Son   . Heart disease Maternal Grandfather   . Heart disease Paternal Grandfather   . Hyperlipidemia Sister   . Alcohol abuse Sister   . Hyperlipidemia Sister   . Hypertension Sister   . Alcohol abuse Brother   . Early death Brother   . Heart attack Brother   . Heart disease Brother   . Hyperlipidemia Brother    Social History   Socioeconomic History  . Marital status: Married    Spouse name: Not on file  . Number of  children: Not on file  . Years of education: Not on file  . Highest education level: Not on file  Occupational History  . Not on file  Social Needs  . Financial resource strain: Not on file  . Food insecurity    Worry: Not on file    Inability: Not on file  . Transportation needs    Medical: Not on file    Non-medical: Not on file  Tobacco Use  . Smoking status: Never Smoker  . Smokeless tobacco: Never Used  Substance and Sexual Activity  . Alcohol use: Yes    Comment: once in a while  . Drug use: Never  . Sexual activity: Not on file  Lifestyle  . Physical activity    Days per week: Not on file    Minutes per session: Not on file  . Stress: Not on file  Relationships  . Social Herbalist on phone: Not on file    Gets together: Not on file     Attends religious service: Not on file    Active member of club or organization: Not on file    Attends meetings of clubs or organizations: Not on file    Relationship status: Not on file  Other Topics Concern  . Not on file  Social History Narrative  . Not on file    Outpatient Encounter Medications as of 01/27/2019  Medication Sig  . albuterol (PROVENTIL) (2.5 MG/3ML) 0.083% nebulizer solution Take 3 mLs (2.5 mg total) by nebulization every 6 (six) hours.  Marland Kitchen amLODipine (NORVASC) 2.5 MG tablet Take 1 tablet (2.5 mg total) by mouth daily.  Marland Kitchen CALCIUM PO Take 1 tablet by mouth daily.   Marland Kitchen conjugated estrogens (PREMARIN) vaginal cream Place 1 Applicatorful vaginally daily. 1 gram 2 times a week.  . docusate sodium (STOOL SOFTENER LAXATIVE) 100 MG capsule Take 100 mg by mouth 2 (two) times daily.  . famotidine (PEPCID) 10 MG tablet Take 10 mg by mouth 2 (two) times daily. PRN  . fluticasone-salmeterol (ADVAIR HFA) 115-21 MCG/ACT inhaler Inhale 2 puffs into the lungs 2 (two) times daily.  . hydrocortisone cream 1 % Apply 1 application topically 2 (two) times daily.  Marland Kitchen lisinopril (ZESTRIL) 40 MG tablet Take 1 tablet (40 mg total) by mouth daily.  . Multiple Vitamins-Minerals (MULTIVITAL PO) Take 1 tablet by mouth daily.   . Probiotic Product (PROBIOTIC DAILY PO) Take 1 tablet by mouth daily.   . simvastatin (ZOCOR) 20 MG tablet Take 1 tablet (20 mg total) by mouth daily.  Marland Kitchen SPIRIVA RESPIMAT 2.5 MCG/ACT AERS Inhale 2 puffs into the lungs daily.   No facility-administered encounter medications on file as of 01/27/2019.     Activities of Daily Living In your present state of health, do you have any difficulty performing the following activities: 01/27/2019  Hearing? N  Vision? N  Difficulty concentrating or making decisions? N  Walking or climbing stairs? N  Dressing or bathing? N  Doing errands, shopping? N  Preparing Food and eating ? N  Using the Toilet? N  In the past six months,  have you accidently leaked urine? N  Do you have problems with loss of bowel control? N  Managing your Medications? N  Managing your Finances? N  Housekeeping or managing your Housekeeping? N    Patient Care Team: Nche, [name], NP as PCP - General (Internal Medicine) Feliz Beam, MD as Referring Physician (Internal Medicine) Collene Gobble, MD as Consulting  Physician (Pulmonary Disease)    Assessment:   This is a routine wellness examination for Kristen Suarez. Physical assessment deferred to PCP.  Exercise Activities and Dietary recommendations Current Exercise Habits: The patient does not participate in regular exercise at present, Exercise limited by: respiratory conditions(s)   Diet (meal preparation, eat out, water intake, caffeinated beverages, dairy products, fruits and vegetables): in general, a "healthy" diet  , well balanced     Goals    . Increase physical activity       Fall Risk Fall Risk  01/27/2019 04/14/2018  Falls in the past year? 0 0  Number falls in past yr: 0 -  Injury with Fall? 0 -    Depression Screen PHQ 2/9 Scores 01/27/2019 04/14/2018  PHQ - 2 Score 0 0     Cognitive Function Ad8 score reviewed for issues:  Issues making decisions:no  Less interest in hobbies / activities:no  Repeats questions, stories (family complaining):no  Trouble using ordinary gadgets (microwave, computer, phone):no  Forgets the month or year: no  Mismanaging finances: no  Remembering appts:no  Daily problems with thinking and/or memory:no Ad8 score is=0   MMSE - Mini Mental State Exam 09/08/2018  Orientation to time 5  Orientation to Place 5  Registration 3  Attention/ Calculation 5  Recall 3  Language- name 2 objects 2  Language- repeat 1  Language- follow 3 step command 3  Language- read & follow direction 1  Write a sentence 1  Copy design 1  Total score 30        Immunization History  Administered Date(s) Administered  . Fluad  Quad(high Dose 65+) 11/19/2018  . Influenza, High Dose Seasonal PF 12/11/2017  . Pneumococcal Conjugate-13 03/23/2014  . Pneumococcal Polysaccharide-23 03/24/2012  . Tdap 03/24/2012    Screening Tests Health Maintenance  Topic Date Due  . Hepatitis C Screening  09/08/2019 (Originally Aug 06, 1945)  . MAMMOGRAM  12/17/2019  . TETANUS/TDAP  03/24/2022  . COLONOSCOPY  01/21/2026  . INFLUENZA VACCINE  Completed  . DEXA SCAN  Completed  . PNA vac Low Risk Adult  Completed       Plan:   See you next year!  Continue to eat heart healthy diet (full of fruits, vegetables, whole grains, lean protein, water--limit salt, fat, and sugar intake) and increase physical activity as tolerated.  Continue doing brain stimulating activities (puzzles, reading, adult coloring books, staying active) to keep memory sharp.     I have personally reviewed and noted the following in the patient's chart:   . Medical and social history . Use of alcohol, tobacco or illicit drugs  . Current medications and supplements . Functional ability and status . Nutritional status . Physical activity . Advanced directives . List of other physicians . Hospitalizations, surgeries, and ER visits in previous 12 months . Vitals . Screenings to include cognitive, depression, and falls . Referrals and appointments  In addition, I have reviewed and discussed with patient certain preventive protocols, quality metrics, and best practice recommendations. A written personalized care plan for preventive services as well as general preventive health recommendations were provided to patient.     Shela Nevin, [place]  01/27/2019

## 2019-01-27 ENCOUNTER — Ambulatory Visit (INDEPENDENT_AMBULATORY_CARE_PROVIDER_SITE_OTHER): Payer: Medicare Other | Admitting: *Deleted

## 2019-01-27 ENCOUNTER — Encounter: Payer: Self-pay | Admitting: *Deleted

## 2019-01-27 VITALS — Temp 97.4°F

## 2019-01-27 DIAGNOSIS — Z0000 Encounter for general adult medical examination without abnormal findings: Secondary | ICD-10-CM

## 2019-01-27 NOTE — Patient Instructions (Signed)
See you next year!  Continue to eat heart healthy diet (full of fruits, vegetables, whole grains, lean protein, water--limit salt, fat, and sugar intake) and increase physical activity as tolerated.  Continue doing brain stimulating activities (puzzles, reading, adult coloring books, staying active) to keep memory sharp.    Kristen Suarez , Thank you for taking time to come for your Medicare Wellness Visit. I appreciate your ongoing commitment to your health goals. Please review the following plan we discussed and let me know if I can assist you in the future.   These are the goals we discussed: Goals    . Increase physical activity       This is a list of the screening recommended for you and due dates:  Health Maintenance  Topic Date Due  .  Hepatitis C: One time screening is recommended by Center for Disease Control  (CDC) for  adults born from 50 through 1965.   09/08/2019*  . Mammogram  12/17/2019  . Tetanus Vaccine  03/24/2022  . Colon Cancer Screening  01/21/2026  . Flu Shot  Completed  . DEXA scan (bone density measurement)  Completed  . Pneumonia vaccines  Completed  *Topic was postponed. The date shown is not the original due date.    Preventive Care 38 Years and Older, Female Preventive care refers to lifestyle choices and visits with your health care provider that can promote health and wellness. This includes:  A yearly physical exam. This is also called an annual well check.  Regular dental and eye exams.  Immunizations.  Screening for certain conditions.  Healthy lifestyle choices, such as diet and exercise. What can I expect for my preventive care visit? Physical exam Your health care provider will check:  Height and weight. These may be used to calculate body mass index (BMI), which is a measurement that tells if you are at a healthy weight.  Heart rate and blood pressure.  Your skin for abnormal spots. Counseling Your health care provider may ask you  questions about:  Alcohol, tobacco, and drug use.  Emotional well-being.  Home and relationship well-being.  Sexual activity.  Eating habits.  History of falls.  Memory and ability to understand (cognition).  Work and work Statistician.  Pregnancy and menstrual history. What immunizations do I need?  Influenza (flu) vaccine  This is recommended every year. Tetanus, diphtheria, and pertussis (Tdap) vaccine  You may need a Td booster every 10 years. Varicella (chickenpox) vaccine  You may need this vaccine if you have not already been vaccinated. Zoster (shingles) vaccine  You may need this after age 11. Pneumococcal conjugate (PCV13) vaccine  One dose is recommended after age 41. Pneumococcal polysaccharide (PPSV23) vaccine  One dose is recommended after age 58. Measles, mumps, and rubella (MMR) vaccine  You may need at least one dose of MMR if you were born in 1957 or later. You may also need a second dose. Meningococcal conjugate (MenACWY) vaccine  You may need this if you have certain conditions. Hepatitis A vaccine  You may need this if you have certain conditions or if you travel or work in places where you may be exposed to hepatitis A. Hepatitis B vaccine  You may need this if you have certain conditions or if you travel or work in places where you may be exposed to hepatitis B. Haemophilus influenzae type b (Hib) vaccine  You may need this if you have certain conditions. You may receive vaccines as individual doses or as  more than one vaccine together in one shot (combination vaccines). Talk with your health care provider about the risks and benefits of combination vaccines. What tests do I need? Blood tests  Lipid and cholesterol levels. These may be checked every 5 years, or more frequently depending on your overall health.  Hepatitis C test.  Hepatitis B test. Screening  Lung cancer screening. You may have this screening every year starting at  age 79 if you have a 30-pack-year history of smoking and currently smoke or have quit within the past 15 years.  Colorectal cancer screening. All adults should have this screening starting at age 74 and continuing until age 42. Your health care provider may recommend screening at age 5 if you are at increased risk. You will have tests every 1-10 years, depending on your results and the type of screening test.  Diabetes screening. This is done by checking your blood sugar (glucose) after you have not eaten for a while (fasting). You may have this done every 1-3 years.  Mammogram. This may be done every 1-2 years. Talk with your health care provider about how often you should have regular mammograms.  BRCA-related cancer screening. This may be done if you have a family history of breast, ovarian, tubal, or peritoneal cancers. Other tests  Sexually transmitted disease (STD) testing.  Bone density scan. This is done to screen for osteoporosis. You may have this done starting at age 33. Follow these instructions at home: Eating and drinking  Eat a diet that includes fresh fruits and vegetables, whole grains, lean protein, and low-fat dairy products. Limit your intake of foods with high amounts of sugar, saturated fats, and salt.  Take vitamin and mineral supplements as recommended by your health care provider.  Do not drink alcohol if your health care provider tells you not to drink.  If you drink alcohol: ? Limit how much you have to 0-1 drink a day. ? Be aware of how much alcohol is in your drink. In the [place] one drink equals one 12 oz bottle of beer (355 mL), one 5 oz glass of wine (148 mL), or one 1 oz glass of hard liquor (44 mL). Lifestyle  Take daily care of your teeth and gums.  Stay active. Exercise for at least 30 minutes on 5 or more days each week.  Do not use any products that contain nicotine or tobacco, such as cigarettes, e-cigarettes, and chewing tobacco. If you need  help quitting, ask your health care provider.  If you are sexually active, practice safe sex. Use a condom or other form of protection in order to prevent STIs (sexually transmitted infections).  Talk with your health care provider about taking a low-dose aspirin or statin. What's next?  Go to your health care provider once a year for a well check visit.  Ask your health care provider how often you should have your eyes and teeth checked.  Stay up to date on all vaccines. This information is not intended to replace advice given to you by your health care provider. Make sure you discuss any questions you have with your health care provider. Document Released: 03/31/2015 Document Revised: 02/26/2018 Document Reviewed: 02/26/2018 Elsevier Patient Education  2020 [employer].

## 2019-01-27 NOTE — Addendum Note (Signed)
Addended by: Naaman Plummer A on: 01/27/2019 12:04 PM   Modules accepted: Level of Service

## 2019-02-09 ENCOUNTER — Ambulatory Visit (INDEPENDENT_AMBULATORY_CARE_PROVIDER_SITE_OTHER): Payer: Medicare Other | Admitting: *Deleted

## 2019-02-09 DIAGNOSIS — R55 Syncope and collapse: Secondary | ICD-10-CM | POA: Diagnosis not present

## 2019-02-09 LAB — CUP PACEART REMOTE DEVICE CHECK
Date Time Interrogation Session: 20201124105834
Implantable Pulse Generator Implant Date: 20190109

## 2019-02-25 ENCOUNTER — Other Ambulatory Visit: Payer: Self-pay

## 2019-02-25 ENCOUNTER — Other Ambulatory Visit: Payer: Self-pay | Admitting: Obstetrics and Gynecology

## 2019-02-25 ENCOUNTER — Ambulatory Visit
Admission: RE | Admit: 2019-02-25 | Discharge: 2019-02-25 | Disposition: A | Discharge status: Home / Self Care | Payer: Medicare Other | Source: Ambulatory Visit | Attending: Obstetrics and Gynecology | Admitting: Obstetrics and Gynecology

## 2019-02-25 DIAGNOSIS — Z1231 Encounter for screening mammogram for malignant neoplasm of breast: Secondary | ICD-10-CM

## 2019-03-15 ENCOUNTER — Ambulatory Visit (INDEPENDENT_AMBULATORY_CARE_PROVIDER_SITE_OTHER): Payer: Medicare Other | Admitting: *Deleted

## 2019-03-15 DIAGNOSIS — R55 Syncope and collapse: Secondary | ICD-10-CM | POA: Diagnosis not present

## 2019-03-15 LAB — CUP PACEART REMOTE DEVICE CHECK
Date Time Interrogation Session: 20201226232054
Implantable Pulse Generator Implant Date: 20190109

## 2019-03-29 ENCOUNTER — Other Ambulatory Visit: Payer: Self-pay

## 2019-03-29 ENCOUNTER — Encounter: Payer: Self-pay | Admitting: Nurse Practitioner

## 2019-03-29 ENCOUNTER — Telehealth (INDEPENDENT_AMBULATORY_CARE_PROVIDER_SITE_OTHER): Payer: Medicare Other | Admitting: Nurse Practitioner

## 2019-03-29 VITALS — BP 152/76 | HR 82 | Temp 97.4°F | Ht 63.75 in

## 2019-03-29 DIAGNOSIS — N3000 Acute cystitis without hematuria: Secondary | ICD-10-CM | POA: Diagnosis not present

## 2019-03-29 MED ORDER — NITROFURANTOIN MONOHYD MACRO 100 MG PO CAPS: 100.0000 mg | ORAL_CAPSULE | Freq: Two times a day (BID) | ORAL | 0 refills | Status: AC

## 2019-03-29 NOTE — Patient Instructions (Signed)
Maintain adequate oral hydration. Call office if no improvement in 2days.

## 2019-03-29 NOTE — Progress Notes (Signed)
Virtual Visit via Video Note  I connected with Kristen Suarez on 03/29/19 at  2:00 PM EST by a video enabled telemedicine application and verified that I am speaking with the correct person using two identifiers.  Location: Patient:home Provider:office Participants: patient and provider I discussed the limitations of evaluation and management by telemedicine and the availability of in person appointments. The patient expressed understanding and agreed to proceed.  CC: pt is c/o right side back pain,pressure,nausea at times/frequent urinate/going on6 days/ hx of bad uti 3 yrs ago/ibuprofen  History of Present Illness: Urinary Tract Infection  This is a new problem. The current episode started in the past 7 days. The problem occurs every urination. The problem has been unchanged. The quality of the pain is described as aching and burning. There has been no fever. She is not sexually active. There is no history of pyelonephritis. Associated symptoms include chills, flank pain, frequency, nausea and urgency. Pertinent negatives include no discharge, hematuria, hesitancy, possible pregnancy, sweats or vomiting. Associated symptoms comments: Right flank. She has tried increased fluids and NSAIDs for the symptoms. The treatment provided mild relief. Her past medical history is significant for urinary stasis. There is no history of catheterization, kidney stones, recurrent UTIs, a single kidney or a urological procedure.   Observations/Objective: Physical Exam  Constitutional: She is oriented to person, place, and time.  Pulmonary/Chest: Effort normal.  Neurological: She is alert and oriented to person, place, and time.  Skin: Skin is dry.  Psychiatric: She has a normal mood and affect. Her behavior is normal. Thought content normal.  Vitals reviewed.  Assessment and Plan: Kristen Suarez was seen today for urinary tract infection.  Diagnoses and all orders for this visit:  Acute cystitis without  hematuria -     nitrofurantoin, macrocrystal-monohydrate, (MACROBID) 100 MG capsule; Take 1 capsule (100 mg total) by mouth 2 (two) times daily for 7 days.   Follow Up Instructions: See avs   I discussed the assessment and treatment plan with the patient. The patient was provided an opportunity to ask questions and all were answered. The patient agreed with the plan and demonstrated an understanding of the instructions.   The patient was advised to call back or seek an in-person evaluation if the symptoms worsen or if the condition fails to improve as anticipated.  Wilfred Lacy, NP

## 2019-04-13 ENCOUNTER — Encounter: Payer: Self-pay | Admitting: Nurse Practitioner

## 2019-04-14 ENCOUNTER — Encounter: Payer: Self-pay | Admitting: Nurse Practitioner

## 2019-04-14 ENCOUNTER — Telehealth (INDEPENDENT_AMBULATORY_CARE_PROVIDER_SITE_OTHER): Payer: Medicare Other | Admitting: Nurse Practitioner

## 2019-04-14 VITALS — BP 158/78 | HR 86 | Temp 97.1°F | Ht 63.75 in | Wt 144.0 lb

## 2019-04-14 DIAGNOSIS — N3000 Acute cystitis without hematuria: Secondary | ICD-10-CM

## 2019-04-14 DIAGNOSIS — M791 Myalgia, unspecified site: Secondary | ICD-10-CM | POA: Diagnosis not present

## 2019-04-14 DIAGNOSIS — R600 Localized edema: Secondary | ICD-10-CM

## 2019-04-14 MED ORDER — FUROSEMIDE 20 MG PO TABS: 20.0000 mg | ORAL_TABLET | Freq: Every day | ORAL | 0 refills | Status: DC

## 2019-04-14 NOTE — Progress Notes (Signed)
Virtual Visit via Video Note  I connected with@ on 04/14/19 at 10:00 AM EST by a video enabled telemedicine application and verified that I am speaking with the correct person using two identifiers.  Location: Patient:Home Provider: Office Participants: patient and provider  I discussed the limitations of evaluation and management by telemedicine and the availability of in person appointments. I also discussed with the patient that there may be a patient responsible charge related to this service. The patient expressed understanding and agreed to proceed.  CC:LE edema x 3days, joint and muscle pain x 2weeks.  History of Present Illness: LE edema: Sudden onset 3days ago, associated with pain and erythema. gradually improving with elevation and walking, denies increase in sodium intake, no leg or foot injury, no immobilization or travel. Denies any SOB/CP/palpitation/PND/ABD distension.   HTN: BP not at goal. Current use of amlodipine and lisnopril.                 Amlodipine dose decreased and HCTZ discontinued 08/2018 due to syncopal episode (by cardiology) Reports home BP reading 150s/80s-160s/90. BP Readings from Last 3 Encounters:  04/14/19 (!) 158/78  03/29/19 (!) 152/76  01/05/19 (!) 169/85   Wt Readings from Last 3 Encounters:  04/14/19 144 lb (65.3 kg)  01/05/19 149 lb 4.8 oz (67.7 kg)  01/04/19 148 lb 9.6 oz (67.4 kg)   Observations/Objective: Physical Exam  Constitutional: She is oriented to person, place, and time. No distress.  Pulmonary/Chest: Effort normal.  Musculoskeletal:        General: Edema present.  Neurological: She is alert and oriented to person, place, and time.  Skin: Skin is warm and dry. No rash noted. No erythema.   Assessment and Plan: Allora was seen today for follow-up.  Diagnoses and all orders for this visit:  Bilateral leg edema -     furosemide (LASIX) 20 MG tablet; Take 1 tablet (20 mg total) by mouth daily. -     Basic metabolic  panel; Future  Acute cystitis without hematuria -     Urinalysis w microscopic + reflex cultur; Future  Myalgia -     TSH; Future -     Sedimentation rate; Future -     CK (Creatine Kinase); Future -     CBC w/Diff; Future   Follow Up Instructions: See avs   I discussed the assessment and treatment plan with the patient. The patient was provided an opportunity to ask questions and all were answered. The patient agreed with the plan and demonstrated an understanding of the instructions.   The patient was advised to call back or seek an in-person evaluation if the symptoms worsen or if the condition fails to improve as anticipated.  Wilfred Lacy, NP

## 2019-04-14 NOTE — Patient Instructions (Signed)
Take furosemide 20mg  daily x 3days. Go to lab for blood draw on 04/22/2019 at 8:40am.  Use compression stocking during the day and off at night. Maintain DASH diet.   Peripheral Edema  Peripheral edema is swelling that is caused by a buildup of fluid. Peripheral edema most often affects the lower legs, ankles, and feet. It can also develop in the arms, hands, and face. The area of the body that has peripheral edema will look swollen. It may also feel heavy or warm. Your clothes may start to feel tight. Pressing on the area may make a temporary dent in your skin. You may not be able to move your swollen arm or leg as much as usual. There are many causes of peripheral edema. It can happen because of a complication of other conditions such as congestive heart failure, kidney disease, or a problem with your blood circulation. It also can be a side effect of certain medicines or because of an infection. It often happens to women during pregnancy. Sometimes, the cause is not known. Follow these instructions at home: Managing pain, stiffness, and swelling   Raise (elevate) your legs while you are sitting or lying down.  Move around often to prevent stiffness and to lessen swelling.  Do not sit or stand for long periods of time.  Wear support stockings as told by your health care provider. Medicines  Take over-the-counter and prescription medicines only as told by your health care provider.  Your health care provider may prescribe medicine to help your body get rid of excess water (diuretic). General instructions  Pay attention to any changes in your symptoms.  Follow instructions from your health care provider about limiting salt (sodium) in your diet. Sometimes, eating less salt may reduce swelling.  Moisturize skin daily to help prevent skin from cracking and draining.  Keep all follow-up visits as told by your health care provider. This is important. Contact a health care provider if  you have:  A fever.  Edema that starts suddenly or is getting worse, especially if you are pregnant or have a medical condition.  Swelling in only one leg.  Increased swelling, redness, or pain in one or both of your legs.  Drainage or sores at the area where you have edema. Get help right away if you:  Develop shortness of breath, especially when you are lying down.  Have pain in your chest or abdomen.  Feel weak.  Feel faint. Summary  Peripheral edema is swelling that is caused by a buildup of fluid. Peripheral edema most often affects the lower legs, ankles, and feet.  Move around often to prevent stiffness and to lessen swelling. Do not sit or stand for long periods of time.  Pay attention to any changes in your symptoms.  Contact a health care provider if you have edema that starts suddenly or is getting worse, especially if you are pregnant or have a medical condition.  Get help right away if you develop shortness of breath, especially when lying down. This information is not intended to replace advice given to you by your health care provider. Make sure you discuss any questions you have with your health care provider. Document Revised: 11/26/2017 Document Reviewed: 11/26/2017 Elsevier Patient Education  [employer].

## 2019-04-15 ENCOUNTER — Ambulatory Visit (INDEPENDENT_AMBULATORY_CARE_PROVIDER_SITE_OTHER): Payer: Medicare Other | Admitting: *Deleted

## 2019-04-15 DIAGNOSIS — R55 Syncope and collapse: Secondary | ICD-10-CM | POA: Diagnosis not present

## 2019-04-15 LAB — CUP PACEART REMOTE DEVICE CHECK
Date Time Interrogation Session: 20210128022841
Implantable Pulse Generator Implant Date: 20190109

## 2019-04-15 NOTE — Progress Notes (Signed)
ILR Remote 

## 2019-04-16 ENCOUNTER — Telehealth: Payer: Self-pay | Admitting: Nurse Practitioner

## 2019-04-16 NOTE — Telephone Encounter (Signed)
Patient is calling and stated that she is still experiencing the swelling and high blood pressure. PT bp is 146/80.Pt declined app. CB  [phone]

## 2019-04-19 ENCOUNTER — Encounter: Payer: Self-pay | Admitting: Nurse Practitioner

## 2019-04-20 ENCOUNTER — Other Ambulatory Visit: Payer: Self-pay

## 2019-04-21 ENCOUNTER — Ambulatory Visit (INDEPENDENT_AMBULATORY_CARE_PROVIDER_SITE_OTHER): Payer: Medicare Other

## 2019-04-21 ENCOUNTER — Encounter: Payer: Self-pay | Admitting: Nurse Practitioner

## 2019-04-21 ENCOUNTER — Ambulatory Visit (INDEPENDENT_AMBULATORY_CARE_PROVIDER_SITE_OTHER): Payer: Medicare Other | Admitting: Nurse Practitioner

## 2019-04-21 VITALS — BP 160/84 | HR 67 | Temp 97.3°F | Ht 63.75 in | Wt 149.4 lb

## 2019-04-21 DIAGNOSIS — I10 Essential (primary) hypertension: Secondary | ICD-10-CM

## 2019-04-21 DIAGNOSIS — R600 Localized edema: Secondary | ICD-10-CM

## 2019-04-21 DIAGNOSIS — M2550 Pain in unspecified joint: Secondary | ICD-10-CM | POA: Diagnosis not present

## 2019-04-21 DIAGNOSIS — R0600 Dyspnea, unspecified: Secondary | ICD-10-CM

## 2019-04-21 DIAGNOSIS — M791 Myalgia, unspecified site: Secondary | ICD-10-CM | POA: Diagnosis not present

## 2019-04-21 DIAGNOSIS — R0609 Other forms of dyspnea: Secondary | ICD-10-CM

## 2019-04-21 DIAGNOSIS — R8290 Unspecified abnormal findings in urine: Secondary | ICD-10-CM

## 2019-04-21 LAB — CBC WITH DIFFERENTIAL/PLATELET
Basophils Absolute: 0 10*3/uL (ref 0.0–0.1)
Basophils Relative: 0.5 % (ref 0.0–3.0)
Eosinophils Absolute: 0.2 10*3/uL (ref 0.0–0.7)
Eosinophils Relative: 2.7 % (ref 0.0–5.0)
HCT: 40.1 % (ref 36.0–46.0)
Hemoglobin: 13.5 g/dL (ref 12.0–15.0)
Lymphocytes Relative: 13.3 % (ref 12.0–46.0)
Lymphs Abs: 1.2 10*3/uL (ref 0.7–4.0)
MCHC: 33.6 g/dL (ref 30.0–36.0)
MCV: 90.2 fl (ref 78.0–100.0)
Monocytes Absolute: 0.7 10*3/uL (ref 0.1–1.0)
Monocytes Relative: 7.5 % (ref 3.0–12.0)
Neutro Abs: 6.9 10*3/uL (ref 1.4–7.7)
Neutrophils Relative %: 76 % (ref 43.0–77.0)
Platelets: 303 10*3/uL (ref 150.0–400.0)
RBC: 4.44 Mil/uL (ref 3.87–5.11)
RDW: 14 % (ref 11.5–15.5)
WBC: 9 10*3/uL (ref 4.0–10.5)

## 2019-04-21 LAB — C-REACTIVE PROTEIN: CRP: 2.5 mg/dL (ref 0.5–20.0)

## 2019-04-21 LAB — BASIC METABOLIC PANEL
BUN: 19 mg/dL (ref 6–23)
CO2: 30 mEq/L (ref 19–32)
Calcium: 10.1 mg/dL (ref 8.4–10.5)
Chloride: 100 mEq/L (ref 96–112)
Creatinine, Ser: 1.07 mg/dL (ref 0.40–1.20)
GFR: 50.16 mL/min — ABNORMAL LOW (ref >60.00)
Glucose, Bld: 86 mg/dL (ref 70–99)
Potassium: 4.1 mEq/L (ref 3.5–5.1)
Sodium: 138 mEq/L (ref 135–145)

## 2019-04-21 LAB — TSH: TSH: 1.87 u[IU]/mL (ref 0.35–4.50)

## 2019-04-21 LAB — CK: Total CK: 52 U/L (ref 7–177)

## 2019-04-21 LAB — SEDIMENTATION RATE: Sed Rate: 77 mm/hr — ABNORMAL HIGH (ref 0–30)

## 2019-04-21 LAB — BRAIN NATRIURETIC PEPTIDE: Pro B Natriuretic peptide (BNP): 43 pg/mL (ref 0.0–100.0)

## 2019-04-21 NOTE — Progress Notes (Signed)
Subjective:  Patient ID: Kristen Suarez, female    DOB: March 28, 1945  Age: 74 y.o. MRN: [id number]  CC: Follow-up (follow up on BP and lipid/feet swelling,redness,joints pain,not able to put weight--going on 1 wk. lasix didnt help, ibuprofen for pain. )  HPI  HTN: Not controlled with lisinopril 40mg  and amlodipine 2.5mg . Amlodipine dose decreased and HCTZ discontinued 08/2018 due to syncopal episode (by cardiology). Reports she is compliance with medication and diet, BP Readings from Last 3 Encounters:  04/21/19 (!) 160/84  04/14/19 (!) 158/78  03/29/19 (!) 152/76   Edema: no improvement with furosemide. Swelling of LE and hands. Chronic SOB, has not noticed a change.  Joint pain and stiffness: Worsening over the last 58months. Minimal improvement with ibuprofen.  Reviewed past Medical, Social and Family history today.  Outpatient Medications Prior to Visit  Medication Sig Dispense Refill  . albuterol (PROVENTIL) (2.5 MG/3ML) 0.083% nebulizer solution Take 3 mLs (2.5 mg total) by nebulization every 6 (six) hours. 150 mL 2  . CALCIUM PO Take 1 tablet by mouth daily.     Marland Kitchen docusate sodium (STOOL SOFTENER LAXATIVE) 100 MG capsule Take 100 mg by mouth 2 (two) times daily.    . famotidine (PEPCID) 10 MG tablet Take 10 mg by mouth 2 (two) times daily. PRN    . fluticasone-salmeterol (ADVAIR HFA) 115-21 MCG/ACT inhaler Inhale 2 puffs into the lungs 2 (two) times daily. 3 Inhaler 1  . lisinopril (ZESTRIL) 40 MG tablet Take 1 tablet (40 mg total) by mouth daily. 90 tablet 1  . Multiple Vitamins-Minerals (MULTIVITAL PO) Take 1 tablet by mouth daily.     . Probiotic Product (PROBIOTIC DAILY PO) Take 1 tablet by mouth daily.     . simvastatin (ZOCOR) 20 MG tablet Take 1 tablet (20 mg total) by mouth daily. 90 tablet 1  . SPIRIVA RESPIMAT 2.5 MCG/ACT AERS Inhale 2 puffs into the lungs daily. 12 g 3  . Vaginal Lubricant (REPLENS VA) Place vaginally. cream    . IBUPROFEN PO Take by mouth.      Marland Kitchen amLODipine (NORVASC) 5 MG tablet Take 1 tablet (5 mg total) by mouth daily. 180 tablet 3  . amLODipine (NORVASC) 2.5 MG tablet Take 1 tablet (2.5 mg total) by mouth daily. (Patient not taking: Reported on 04/14/2019) 180 tablet 3  . furosemide (LASIX) 20 MG tablet Take 1 tablet (20 mg total) by mouth daily. (Patient not taking: Reported on 04/21/2019) 7 tablet 0   No facility-administered medications prior to visit.    ROS See HPI  Objective:  BP (!) 160/84   Pulse 67   Temp (!) 97.3 F (36.3 C) (Tympanic)   Ht 5' 3.75" (1.619 m)   Wt 149 lb 6.4 oz (67.8 kg)   SpO2 99%   BMI 25.85 kg/m   BP Readings from Last 3 Encounters:  04/21/19 (!) 160/84  04/14/19 (!) 158/78  03/29/19 (!) 152/76    Wt Readings from Last 3 Encounters:  04/21/19 149 lb 6.4 oz (67.8 kg)  04/14/19 144 lb (65.3 kg)  01/05/19 149 lb 4.8 oz (67.7 kg)    Physical Exam Vitals reviewed.  Cardiovascular:     Rate and Rhythm: Normal rate and regular rhythm.  Pulmonary:     Effort: Pulmonary effort is normal.     Breath sounds: Wheezing and rales present.  Abdominal:     General: Bowel sounds are normal. There is no distension.     Palpations: Abdomen is soft.  Tenderness: There is no abdominal tenderness.  Musculoskeletal:     Cervical back: Normal range of motion and neck supple.     Right lower leg: Edema present.     Left lower leg: Edema present.     Comments: No joint effusion or joint deformity  Skin:    Findings: No erythema or rash.  Neurological:     Mental Status: She is alert and oriented to person, place, and time.  Psychiatric:        Mood and Affect: Mood normal.        Behavior: Behavior normal.        Thought Content: Thought content normal.    Lab Results  Component Value Date   WBC 9.0 04/21/2019   HGB 13.5 04/21/2019   HCT 40.1 04/21/2019   PLT 303.0 04/21/2019   GLUCOSE 86 04/21/2019   CHOL 191 09/08/2018   TRIG 135.0 09/08/2018   HDL 53.30 09/08/2018   LDLCALC  110 (H) 09/08/2018   ALT 21 09/08/2018   AST 19 09/08/2018   NA 138 04/21/2019   K 4.1 04/21/2019   CL 100 04/21/2019   CREATININE 1.07 04/21/2019   BUN 19 04/21/2019   CO2 30 04/21/2019   TSH 1.87 04/21/2019    Assessment & Plan:  This visit occurred during the SARS-CoV-2 public health emergency.  Safety protocols were in place, including screening questions prior to the visit, additional usage of staff PPE, and extensive cleaning of exam room while observing appropriate contact time as indicated for disinfecting solutions.   Kristen Suarez was seen today for follow-up.  Diagnoses and all orders for this visit:  HTN (hypertension), benign -     Basic metabolic panel  Bilateral leg edema -     B Nat Peptide -     DG Chest 2 View -     furosemide (LASIX) 20 MG tablet; Take 1 tablet (20 mg total) by mouth 3 (three) times a week. -     potassium chloride SA (KLOR-CON) 20 MEQ tablet; Take 1 tablet (20 mEq total) by mouth 3 (three) times a week.  Myalgia -     CBC w/Diff -     CK (Creatine Kinase) -     TSH -     Sedimentation rate -     C-reactive protein -     Antinuclear Antib (ANA) -     Urinalysis w microscopic + reflex cultur  Multiple joint pain -     TSH -     naproxen (NAPROSYN) 375 MG tablet; Take 1 tablet (375 mg total) by mouth 2 (two) times daily with a meal.  Dyspnea on exertion -     B Nat Peptide -     DG Chest 2 View -     furosemide (LASIX) 20 MG tablet; Take 1 tablet (20 mg total) by mouth 3 (three) times a week.  Other orders -     Urine Culture -     REFLEXIVE URINE CULTURE   I have discontinued Kristen Suarez's IBUPROFEN PO. I have also changed her amLODipine and furosemide. Additionally, I am having her start on potassium chloride SA and naproxen. Lastly, I am having her maintain her Multiple Vitamins-Minerals (MULTIVITAL PO), CALCIUM PO, Probiotic Product (PROBIOTIC DAILY PO), famotidine, docusate sodium, lisinopril, simvastatin, Spiriva Respimat,  albuterol, fluticasone-salmeterol, and Vaginal Lubricant (REPLENS VA).  Meds ordered this encounter  Medications  . furosemide (LASIX) 20 MG tablet    Sig: Take 1 tablet (20 mg  total) by mouth 3 (three) times a week.    Dispense:  12 tablet    Refill:  2    Order Specific Question:   Supervising Provider    Answer:   Lucille Passy [[id number]]  . potassium chloride SA (KLOR-CON) 20 MEQ tablet    Sig: Take 1 tablet (20 mEq total) by mouth 3 (three) times a week.    Dispense:  12 tablet    Refill:  2    Order Specific Question:   Supervising Provider    Answer:   Lucille Passy [[id number]]  . naproxen (NAPROSYN) 375 MG tablet    Sig: Take 1 tablet (375 mg total) by mouth 2 (two) times daily with a meal.    Dispense:  14 tablet    Refill:  0    Order Specific Question:   Supervising Provider    Answer:   Lucille Passy [[id number]]    Problem List Items Addressed This Visit      Cardiovascular and Mediastinum   HTN (hypertension), benign - Primary   Relevant Medications   amLODipine (NORVASC) 5 MG tablet   furosemide (LASIX) 20 MG tablet (Start on 04/23/2019)   Other Relevant Orders   Basic metabolic panel (Completed)     Other   Bilateral leg edema   Relevant Medications   furosemide (LASIX) 20 MG tablet (Start on 04/23/2019)   potassium chloride SA (KLOR-CON) 20 MEQ tablet (Start on 04/23/2019)   Other Relevant Orders   B Nat Peptide (Completed)   DG Chest 2 View (Completed)   Multiple joint pain   Relevant Medications   naproxen (NAPROSYN) 375 MG tablet   Other Relevant Orders   TSH (Completed)    Other Visit Diagnoses    Myalgia       Relevant Orders   CBC w/Diff (Completed)   CK (Creatine Kinase) (Completed)   TSH (Completed)   Sedimentation rate (Completed)   C-reactive protein (Completed)   Antinuclear Antib (ANA)   Urinalysis w microscopic + reflex cultur (Completed)   Dyspnea on exertion       Relevant Medications   furosemide (LASIX) 20 MG tablet (Start on 04/23/2019)   Other  Relevant Orders   B Nat Peptide (Completed)   DG Chest 2 View (Completed)      Follow-up: Return if symptoms worsen or fail to improve.  Wilfred Lacy, NP

## 2019-04-21 NOTE — Patient Instructions (Addendum)
Normal CRP, CK, TSH, and cbc. Stable renal function and electrolyte Abnormal urinalysis, urine culture pending. Pending ANA. Unable to explain edema at this time. Please schedule appt with cardiology. Increase amlodipine to 5mg  Continue furosemide and potassium 3x/week due to renal function, new rx sent Take naproxen for joint pain.  Take amlodipine and lisinopril this afternoon

## 2019-04-22 ENCOUNTER — Other Ambulatory Visit: Payer: Medicare Other

## 2019-04-22 ENCOUNTER — Telehealth: Payer: Self-pay | Admitting: Nurse Practitioner

## 2019-04-22 ENCOUNTER — Telehealth: Payer: Self-pay | Admitting: Internal Medicine

## 2019-04-22 DIAGNOSIS — M2550 Pain in unspecified joint: Secondary | ICD-10-CM | POA: Insufficient documentation

## 2019-04-22 DIAGNOSIS — R600 Localized edema: Secondary | ICD-10-CM | POA: Insufficient documentation

## 2019-04-22 MED ORDER — NAPROXEN 375 MG PO TABS: 375.0000 mg | ORAL_TABLET | Freq: Two times a day (BID) | ORAL | 0 refills | Status: DC

## 2019-04-22 MED ORDER — POTASSIUM CHLORIDE CRYS ER 20 MEQ PO TBCR: 20.0000 meq | EXTENDED_RELEASE_TABLET | ORAL | 2 refills | Status: DC

## 2019-04-22 MED ORDER — FUROSEMIDE 20 MG PO TABS: 20.0000 mg | ORAL_TABLET | ORAL | 2 refills | Status: DC

## 2019-04-22 NOTE — Telephone Encounter (Signed)
Received a message from [facility] request rx for Lasix sent in for 90 days supply which mean quantity of 40 tab instead of 12 tab with 2 refill. Please advise

## 2019-04-22 NOTE — Telephone Encounter (Signed)
Pt c/o swelling: STAT is pt has developed SOB within 24 hours  1) How much weight have you gained and in what time span? No   2) If swelling, where is the swelling located? Both feet, since last week.   3) Are you currently taking a fluid pill? Yes   4) Are you currently SOB? A little bit   5) Do you have a log of your daily weights (if so, list)? No   6) Have you gained 3 pounds in a day or 5 pounds in a week? No   7) Have you traveled recently? No   Patient saw her PCP yesterday, she was put her on lasix last week. She did see her PCP again yesterday for follow up, and a full work was done, results are in [employer].

## 2019-04-23 ENCOUNTER — Encounter: Payer: Self-pay | Admitting: Nurse Practitioner

## 2019-04-23 DIAGNOSIS — J441 Chronic obstructive pulmonary disease with (acute) exacerbation: Secondary | ICD-10-CM

## 2019-04-23 DIAGNOSIS — R509 Fever, unspecified: Secondary | ICD-10-CM

## 2019-04-23 LAB — URINALYSIS W MICROSCOPIC + REFLEX CULTURE
Bacteria, UA: NONE SEEN /HPF
Bilirubin Urine: NEGATIVE
Glucose, UA: NEGATIVE
Hgb urine dipstick: NEGATIVE
Hyaline Cast: NONE SEEN /LPF
Ketones, ur: NEGATIVE
Nitrites, Initial: NEGATIVE
Protein, ur: NEGATIVE
RBC / HPF: NONE SEEN /HPF (ref 0–2)
Specific Gravity, Urine: 1.013 (ref 1.001–1.03)
pH: 5.5 (ref 5.0–8.0)

## 2019-04-23 LAB — URINE CULTURE
MICRO NUMBER:: 10113449
SPECIMEN QUALITY:: ADEQUATE

## 2019-04-23 LAB — CULTURE INDICATED

## 2019-04-23 LAB — ANA: Anti Nuclear Antibody (ANA): NEGATIVE

## 2019-04-23 MED ORDER — ALBUTEROL SULFATE (2.5 MG/3ML) 0.083% IN NEBU: 2.5000 mg | INHALATION_SOLUTION | Freq: Four times a day (QID) | RESPIRATORY_TRACT | 2 refills | Status: DC

## 2019-04-23 NOTE — Telephone Encounter (Signed)
I don't want 90days dispensed. Only 30days at a time

## 2019-04-23 NOTE — Addendum Note (Signed)
Addended by: Leana Gamer on: 04/23/2019 08:29 AM   Modules accepted: Orders

## 2019-04-23 NOTE — Telephone Encounter (Signed)
Rx response back to the pharmacy by faxed.

## 2019-04-26 ENCOUNTER — Encounter: Payer: Self-pay | Admitting: Nurse Practitioner

## 2019-04-26 ENCOUNTER — Telehealth: Payer: Self-pay | Admitting: Internal Medicine

## 2019-04-26 DIAGNOSIS — I10 Essential (primary) hypertension: Secondary | ICD-10-CM

## 2019-04-26 MED ORDER — AMLODIPINE BESYLATE 5 MG PO TABS: 5.0000 mg | ORAL_TABLET | Freq: Every day | ORAL | 3 refills | Status: DC

## 2019-04-26 NOTE — Telephone Encounter (Signed)
Pt c/o BP issue: STAT if pt c/o blurred vision, one-sided weakness or slurred speech  1. What are your last 5 BP readings? 168/82, 158/80, 50's all last week.  2. Are you having any other symptoms (ex. Dizziness, headache, blurred vision, passed out)? headaches  3. What is your BP issue? BP is elevated  Pt c/o swelling: STAT is pt has developed SOB within 24 hours  1) How much weight have you gained and in what time span? No, lost weight  2) If swelling, where is the swelling located? Feet/ red and swollen  3) Are you currently taking a fluid pill? yes  4) Are you currently SOB? no  5) Do you have a log of your daily weights (if so, list)? 144 last Sunday Jan 31st, 140 yesterday   6) Have you gained 3 pounds in a day or 5 pounds in a week? no  7) Have you traveled recently? no

## 2019-04-26 NOTE — Telephone Encounter (Addendum)
Pt complaining of elevated B/P, HA and bilateral edema of her feet x 2 weeks.  Pt states she has seen her PCP and was started on Lasix three x per week along with potassium.  She has completed labs and a CXR and is to increase her Amlodipine per her PCP and continue Lasix and K+  PCP would like her to f/u with cardiology. Pt reports weight is down 4 pounds since starting Lasix and B/P today is 152/80. Has had some mild SOB she believes. Pt advised to continue f/u with her PCP as directed and take medications as prescribed and monitor sodium intake. Pt to clarify if PCP wants referral to general cardiology or Dr Caryl Comes.  Advised Dr Caryl Comes is out of the office this week. If pt develops CP and or SOB will need to go to ED for further evaluation.  Pt verbalizes understanding and agrees with current plan.

## 2019-04-27 NOTE — Telephone Encounter (Signed)
I apologized for this but please help follow up on this request for the pt.

## 2019-04-27 NOTE — Telephone Encounter (Signed)
Spoke with pt and scheduled appointment with Oda Kilts, PA for 05/05/2019 at 1055am per PCP request for evaluation of edema.  Pt reports edema continues to improve with Lasix.  Pt verbalizes understanding of appointment and agrees with plan.

## 2019-04-28 NOTE — Telephone Encounter (Signed)
  Pt already see Baldo Ash for this.

## 2019-04-29 ENCOUNTER — Encounter: Payer: Self-pay | Admitting: Nurse Practitioner

## 2019-04-29 MED ORDER — SIMVASTATIN 20 MG PO TABS: 20.0000 mg | ORAL_TABLET | Freq: Every day | ORAL | 0 refills | Status: DC

## 2019-05-04 ENCOUNTER — Telehealth: Payer: Self-pay

## 2019-05-04 ENCOUNTER — Encounter: Payer: Self-pay | Admitting: Nurse Practitioner

## 2019-05-04 DIAGNOSIS — M2550 Pain in unspecified joint: Secondary | ICD-10-CM

## 2019-05-04 DIAGNOSIS — D869 Sarcoidosis, unspecified: Secondary | ICD-10-CM

## 2019-05-04 DIAGNOSIS — M79671 Pain in right foot: Secondary | ICD-10-CM

## 2019-05-04 DIAGNOSIS — M79672 Pain in left foot: Secondary | ICD-10-CM

## 2019-05-04 MED ORDER — MELOXICAM 7.5 MG PO TABS: 7.5000 mg | ORAL_TABLET | Freq: Every day | ORAL | 0 refills | Status: DC

## 2019-05-04 MED ORDER — PREDNISONE 20 MG PO TABS: ORAL_TABLET | ORAL | 0 refills | Status: AC

## 2019-05-04 NOTE — Progress Notes (Signed)
Electrophysiology Office Note Date: 05/05/2019  ID:  Kristen Suarez, DOB Aug 06, 1945, MRN [id number]  PCP: Flossie Buffy, NP Electrophysiologist: Virl Axe, MD   Kristen Suarez is a 74 y.o. female seen today for Dr. Caryl Comes . she presents today for peripheral edema and BP issues. She brings in BP log today, and levels have gradually improved with adjustment of her amlodipine. This was previously lessened for her edema, but she noticed no improvement, and has noticed no worsening with increased dose. She was also started on lasix 20 mg three times a week, which seems to have helped her edema.  She has bilateral foot and ankle edema, not up into her calves. No calf pain.  She has soreness and pain in her feet that is worse in the morning, and improves as she continues to move throughout the day. She wears compression hose. She denies claudication/calf pain. She did have mild palpitations with an episode of atrial tach 05/01/2018. She has chronic orthopnea due to sarcoid. She is nervous about walking around because she feels the pain is affecting her balance.   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 Arthritis   . Asthma   . Chickenpox   . Colon polyps 2010  . Heart murmur   . History of fainting spells of unknown cause   . Hyperlipidemia   . Hypertension   . Sarcoid 06/16/1976   Past Surgical History:  Procedure Laterality Date  . ABDOMINAL HYSTERECTOMY  1987  . LUNG BIOPSY  06/16/1976    Current Outpatient Medications  Medication Sig Dispense Refill  . albuterol (PROVENTIL) (2.5 MG/3ML) 0.083% nebulizer solution Take 3 mLs (2.5 mg total) by nebulization every 6 (six) hours. 150 mL 2  . amLODipine (NORVASC) 5 MG tablet Take 1 tablet (5 mg total) by mouth daily. 180 tablet 3  . CALCIUM PO Take 1 tablet by mouth daily.     Marland Kitchen docusate sodium (STOOL SOFTENER LAXATIVE) 100 MG capsule Take 100 mg by mouth daily.      . famotidine (PEPCID) 10 MG tablet Take 10 mg by mouth daily. PRN     . fluticasone-salmeterol (ADVAIR HFA) 115-21 MCG/ACT inhaler Inhale 2 puffs into the lungs 2 (two) times daily. 3 Inhaler 1  . furosemide (LASIX) 20 MG tablet Take 1 tablet (20 mg total) by mouth 3 (three) times a week. 12 tablet 2  . lisinopril (ZESTRIL) 40 MG tablet Take 1 tablet (40 mg total) by mouth daily. 90 tablet 1  . meloxicam (MOBIC) 7.5 MG tablet Take 1 tablet (7.5 mg total) by mouth daily. With food 30 tablet 0  . Multiple Vitamins-Minerals (MULTIVITAL PO) Take 1 tablet by mouth daily.     . potassium chloride SA (KLOR-CON) 20 MEQ tablet Take 1 tablet (20 mEq total) by mouth 3 (three) times a week. 12 tablet 2  . predniSONE (DELTASONE) 20 MG tablet Take 2 tablets (40 mg total) by mouth daily with breakfast for 1 day, THEN 1 tablet (20 mg total) daily with breakfast for 2 days, THEN 0.5 tablets (10 mg total) daily with breakfast for 2 days. 5 tablet 0  . Probiotic Product (PROBIOTIC DAILY PO) Take 1 tablet by mouth daily.     . simvastatin (ZOCOR) 20 MG tablet Take 1 tablet (20 mg total) by mouth daily. 90 tablet 0  . SPIRIVA RESPIMAT 2.5 MCG/ACT AERS Inhale 2 puffs into the lungs daily. 12 g 3  .  Vaginal Lubricant (REPLENS [place]) Place vaginally. cream     No current facility-administered medications for this visit.    Allergies:   Penicillins, Sulfa antibiotics, and Keflex [cephalexin]   Social History: Social History   Socioeconomic History  . Marital status: Married    Spouse name: Not on file  . Number of children: Not on file  . Years of education: Not on file  . Highest education level: Not on file  Occupational History  . Not on file  Tobacco Use  . Smoking status: Never Smoker  . Smokeless tobacco: Never Used  Substance and Sexual Activity  . Alcohol use: Yes    Comment: once in a while  . Drug use: Never  . Sexual activity: Not on file  Other Topics Concern  . Not on file  Social History  Narrative  . Not on file   Social Determinants of Health   Financial Resource Strain:   . Difficulty of Paying Living Expenses: Not on file  Food Insecurity:   . Worried About Charity fundraiser in the Last Year: Not on file  . Ran Out of Food in the Last Year: Not on file  Transportation Needs:   . Lack of Transportation (Medical): Not on file  . Lack of Transportation (Non-Medical): Not on file  Physical Activity:   . Days of Exercise per Week: Not on file  . Minutes of Exercise per Session: Not on file  Stress:   . Feeling of Stress : Not on file  Social Connections:   . Frequency of Communication with Friends and Family: Not on file  . Frequency of Social Gatherings with Friends and Family: Not on file  . Attends Religious Services: Not on file  . Active Member of Clubs or Organizations: Not on file  . Attends Archivist Meetings: Not on file  . Marital Status: Not on file  Intimate Partner Violence:   . Fear of Current or Ex-Partner: Not on file  . Emotionally Abused: Not on file  . Physically Abused: Not on file  . Sexually Abused: Not on file    Family History: Family History  Problem Relation Age of Onset  . Arthritis Mother   . Cancer Mother   . Depression Mother   . Heart attack Mother   . Hyperlipidemia Mother   . Hypertension Mother   . Alcohol abuse Father   . Heart disease Father   . Hypertension Father   . Hyperlipidemia Father   . Heart disease Sister   . Stroke Sister   . Alcohol abuse Brother   . Heart disease Brother   . Hypertension Brother   . Hyperlipidemia Brother   . Multiple sclerosis Daughter   . Hyperlipidemia Son   . Heart disease Maternal Grandfather   . Heart disease Paternal Grandfather   . Hyperlipidemia Sister   . Alcohol abuse Sister   . Hyperlipidemia Sister   . Hypertension Sister   . Alcohol abuse Brother   . Early death Brother   . Heart attack Brother   . Heart disease Brother   . Hyperlipidemia Brother        Review of Systems: All other systems reviewed and are otherwise negative except as noted above.  Physical Exam: Vitals:   05/05/19 1059  BP: 138/82  Pulse: 88  SpO2: 98%  Weight: 145 lb 6.4 oz (66 kg)  Height: 5' 2.5" (1.588 m)     GEN- The patient is well appearing, alert and  oriented x 3 today.   HEENT: normocephalic, atraumatic; sclera clear, conjunctiva pink; hearing intact; oropharynx clear; neck supple  Lungs- Clear to ausculation bilaterally, normal work of breathing.  No wheezes, rales, rhonchi Heart- Regular rate and rhythm, no murmurs, rubs or gallops  GI- soft, non-tender, non-distended, bowel sounds present  Extremities- no clubbing or cyanosis. Bilateral feet/ankles with soft, trace edema. Very slightly tender. No pitting. No involvement above the ankle. Malleoli are visible, but slightly "puffy"  MS- no significant deformity or atrophy Skin- warm and dry, no rash or lesion; PPM pocket well healed Psych- euthymic mood, full affect Neuro- strength and sensation are intact  PPM Interrogation- reviewed in detail today,  See PACEART report  EKG:  EKG is not ordered today.  Recent Labs: 09/08/2018: ALT 21 04/21/2019: BUN 19; Creatinine, Ser 1.07; Hemoglobin 13.5; Platelets 303.0; Potassium 4.1; Pro B Natriuretic peptide (BNP) 43.0; Sodium 138; TSH 1.87   Wt Readings from Last 3 Encounters:  05/05/19 145 lb 6.4 oz (66 kg)  04/21/19 149 lb 6.4 oz (67.8 kg)  04/14/19 144 lb (65.3 kg)     Other studies Reviewed: Additional studies/ records that were reviewed today include: Echo 12/2018 shows LVEF 60-65%, Previous EP office notes, Previous remote checks, Most recent labwork.   Assessment and Plan:  1. syncope s/p Medtronic ILR No syncope Episode of AT noted. Follow burden. No change for now.  See Pace Art report No changes today  2. HTN This has improved with increased amlodipine. No further at this time. Can adjust amlodipine further as needed.   3.  Edema Her symptoms are far out of proportion to exam, and her soreness improves as she moves more leading me to suspect an inflammatory/arthritic component. ANA negative. ESR + (non-specific).  She monitors sodium intake very closely. EF normal 12/2018. Despite her puffiness slightly improving with lasix, I do not feel this is a primary cardiac issue.  She has upcoming follow up with a podiatrist and rheumatology, which I hope will be revealing. We will draw her BMET today and forward to Dr. Lorayne Marek with pending ice storm.   4. 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   The patient does not have concerns regarding her medicines.  The following changes were made today:  none  Labs/ tests ordered today include:  Orders Placed This Encounter  Procedures  . Basic Metabolic Panel (BMET)  . CUP PACEART INCLINIC DEVICE CHECK   Disposition:   Follow up with Dr. Caryl Comes in 7 months as scheduled. Sooner as needed.   Kristen Lefevre, PA-C  05/05/2019 11:38 AM  [facility] [address] [place] [address] [phone] (office) [phone] (fax)

## 2019-05-04 NOTE — Telephone Encounter (Signed)
Kristen Suarez FYI--lab appt scheduled.

## 2019-05-04 NOTE — Addendum Note (Signed)
Addended by: Leana Gamer on: 05/04/2019 02:38 PM   Modules accepted: Orders

## 2019-05-04 NOTE — Telephone Encounter (Signed)
LINQ alert received- tachy episode 2/14 1837, 18 beat run of SVT.  Pt history only shows 1 other episode in December 2019.    Spoke with pt, she reports she was symptomatic at time.  She notes feeling fluttering in her chest that lasted only a few seconds.  Per pt the 2019 episode was asymptomatic.    Pt has appt scheduled for OV tomorrow with Oda Kilts, PA.

## 2019-05-04 NOTE — Telephone Encounter (Signed)
Noted  device implanted for syncope  Will follow  at this point would not treat for brief atrial tach  Thanks SK

## 2019-05-04 NOTE — Addendum Note (Signed)
Addended by: Leana Gamer on: 05/04/2019 02:36 PM   Modules accepted: Orders

## 2019-05-05 ENCOUNTER — Other Ambulatory Visit: Payer: Self-pay

## 2019-05-05 ENCOUNTER — Telehealth: Payer: Self-pay | Admitting: Nurse Practitioner

## 2019-05-05 ENCOUNTER — Encounter: Payer: Self-pay | Admitting: Student

## 2019-05-05 ENCOUNTER — Ambulatory Visit (INDEPENDENT_AMBULATORY_CARE_PROVIDER_SITE_OTHER): Payer: Medicare Other | Admitting: Student

## 2019-05-05 VITALS — BP 138/82 | HR 88 | Ht 62.5 in | Wt 145.4 lb

## 2019-05-05 DIAGNOSIS — R55 Syncope and collapse: Secondary | ICD-10-CM | POA: Diagnosis not present

## 2019-05-05 DIAGNOSIS — I10 Essential (primary) hypertension: Secondary | ICD-10-CM | POA: Diagnosis not present

## 2019-05-05 DIAGNOSIS — Z79899 Other long term (current) drug therapy: Secondary | ICD-10-CM | POA: Diagnosis not present

## 2019-05-05 DIAGNOSIS — R609 Edema, unspecified: Secondary | ICD-10-CM

## 2019-05-05 LAB — BASIC METABOLIC PANEL
BUN/Creatinine Ratio: 20 (ref 12–28)
BUN: 22 mg/dL (ref 8–27)
CO2: 24 mmol/L (ref 20–29)
Calcium: 10.3 mg/dL (ref 8.7–10.3)
Chloride: 98 mmol/L (ref 96–106)
Creatinine, Ser: 1.11 mg/dL — ABNORMAL HIGH (ref 0.57–1.00)
GFR calc Af Amer: 57 mL/min/{1.73_m2} — ABNORMAL LOW (ref >59)
GFR calc non Af Amer: 49 mL/min/{1.73_m2} — ABNORMAL LOW (ref >59)
Glucose: 115 mg/dL — ABNORMAL HIGH (ref 65–99)
Potassium: 4.5 mmol/L (ref 3.5–5.2)
Sodium: 137 mmol/L (ref 134–144)

## 2019-05-05 LAB — CUP PACEART INCLINIC DEVICE CHECK
Date Time Interrogation Session: 20210217112453
Implantable Pulse Generator Implant Date: 20190109

## 2019-05-05 NOTE — Telephone Encounter (Signed)
Ok, I saw office note. They will forward results to me once completed

## 2019-05-05 NOTE — Telephone Encounter (Signed)
Charlott FYI--heart care doctor reorder this lab

## 2019-05-05 NOTE — Patient Instructions (Addendum)
Medication Instructions:  none *If you need a refill on your cardiac medications before your next appointment, please call your pharmacy*  Lab Work: TODAY BMET If you have labs (blood work) drawn today and your tests are completely normal, you will receive your results only by: Marland Kitchen MyChart Message (if you have MyChart) OR . A paper copy in the mail If you have any lab test that is abnormal or we need to change your treatment, we will call you to review the results.  Testing/Procedures: none  Follow-Up: At [facility], you and your health needs are our priority.  As part of our continuing mission to provide you with exceptional heart care, we have created designated Provider Care Teams.  These Care Teams include your primary Cardiologist (physician) and Advanced Practice Providers (APPs -  Physician Assistants and Nurse Practitioners) who all work together to provide you with the care you need, when you need it.  Other Instructions

## 2019-05-05 NOTE — Telephone Encounter (Signed)
Patient is calling to let you know that the labs your ordered where done at the cardiologist office this morning.

## 2019-05-06 ENCOUNTER — Encounter: Payer: Self-pay | Admitting: Nurse Practitioner

## 2019-05-06 ENCOUNTER — Other Ambulatory Visit: Payer: Medicare Other

## 2019-05-07 ENCOUNTER — Ambulatory Visit (INDEPENDENT_AMBULATORY_CARE_PROVIDER_SITE_OTHER): Payer: Medicare Other | Admitting: Podiatry

## 2019-05-07 ENCOUNTER — Other Ambulatory Visit: Payer: Self-pay

## 2019-05-07 ENCOUNTER — Ambulatory Visit (INDEPENDENT_AMBULATORY_CARE_PROVIDER_SITE_OTHER): Payer: Medicare Other

## 2019-05-07 ENCOUNTER — Other Ambulatory Visit: Payer: Self-pay | Admitting: Podiatry

## 2019-05-07 DIAGNOSIS — M79671 Pain in right foot: Secondary | ICD-10-CM

## 2019-05-07 DIAGNOSIS — M79672 Pain in left foot: Secondary | ICD-10-CM

## 2019-05-07 DIAGNOSIS — M722 Plantar fascial fibromatosis: Secondary | ICD-10-CM

## 2019-05-11 ENCOUNTER — Encounter: Payer: Self-pay | Admitting: Podiatry

## 2019-05-11 NOTE — Progress Notes (Signed)
Subjective:  Patient ID: Kristen Suarez, female    DOB: 1945/04/19,  MRN: [id number]  Chief Complaint  Patient presents with  . Foot Pain    pt is here for bil foot pain, pt states that most of the foot pain is elevated when applying pressure, pain is mostly on the bottom of both heels, as well as both plantar forefoots as well.    74 y.o. female presents with the above complaint.  Patient presents with bilateral heel pain that has been going on for about a month progressively gotten worse.  Patient describes her symptoms as below static dyskinesia related.  Patient states the left bottom of the heel as well as the plantar foot forefoot bilaterally hurts equally.  Patient has tried inserts but is still in pain.  She had orthotics made over 20 years ago which are still in good shape and has been helping her.  Her pain scale is 9 out of 10.  She denies any other acute complaints.   Review of Systems: Negative except as noted in the HPI. Denies N/V/F/Ch.  Past Medical History:  Diagnosis Date  . Arthritis   . Asthma   . Chickenpox   . Colon polyps 2010  . Heart murmur   . History of fainting spells of unknown cause   . Hyperlipidemia   . Hypertension   . Sarcoid 06/16/1976    Current Outpatient Medications:  .  albuterol (PROVENTIL) (2.5 MG/3ML) 0.083% nebulizer solution, Take 3 mLs (2.5 mg total) by nebulization every 6 (six) hours., Disp: 150 mL, Rfl: 2 .  amLODipine (NORVASC) 5 MG tablet, Take 1 tablet (5 mg total) by mouth daily., Disp: 180 tablet, Rfl: 3 .  CALCIUM PO, Take 1 tablet by mouth daily. , Disp: , Rfl:  .  docusate sodium (STOOL SOFTENER LAXATIVE) 100 MG capsule, Take 100 mg by mouth daily. , Disp: , Rfl:  .  famotidine (PEPCID) 10 MG tablet, Take 10 mg by mouth daily. PRN , Disp: , Rfl:  .  fluticasone-salmeterol (ADVAIR HFA) 115-21 MCG/ACT inhaler, Inhale 2 puffs into the lungs 2 (two) times daily., Disp: 3 Inhaler, Rfl: 1 .  furosemide (LASIX) 20 MG tablet, Take 1  tablet (20 mg total) by mouth 3 (three) times a week., Disp: 12 tablet, Rfl: 2 .  lisinopril (ZESTRIL) 40 MG tablet, Take 1 tablet (40 mg total) by mouth daily., Disp: 90 tablet, Rfl: 1 .  meloxicam (MOBIC) 7.5 MG tablet, Take 1 tablet (7.5 mg total) by mouth daily. With food, Disp: 30 tablet, Rfl: 0 .  Multiple Vitamins-Minerals (MULTIVITAL PO), Take 1 tablet by mouth daily. , Disp: , Rfl:  .  potassium chloride SA (KLOR-CON) 20 MEQ tablet, Take 1 tablet (20 mEq total) by mouth 3 (three) times a week., Disp: 12 tablet, Rfl: 2 .  Probiotic Product (PROBIOTIC DAILY PO), Take 1 tablet by mouth daily. , Disp: , Rfl:  .  simvastatin (ZOCOR) 20 MG tablet, Take 1 tablet (20 mg total) by mouth daily., Disp: 90 tablet, Rfl: 0 .  SPIRIVA RESPIMAT 2.5 MCG/ACT AERS, Inhale 2 puffs into the lungs daily., Disp: 12 g, Rfl: 3 .  Vaginal Lubricant (REPLENS VA), Place vaginally. cream, Disp: , Rfl:   Social History   Tobacco Use  Smoking Status Never Smoker  Smokeless Tobacco Never Used    Allergies  Allergen Reactions  . Penicillins Itching  . Sulfa Antibiotics Itching  . Keflex [Cephalexin] Rash   Objective:  There were no vitals filed  for this visit. There is no height or weight on file to calculate BMI. Constitutional Well developed. Well nourished.  Vascular Dorsalis pedis pulses palpable bilaterally. Posterior tibial pulses palpable bilaterally. Capillary refill normal to all digits.  No cyanosis or clubbing noted. Pedal hair growth normal.  Neurologic Normal speech. Oriented to person, place, and time. Epicritic sensation to light touch grossly present bilaterally.  Dermatologic Nails well groomed and normal in appearance. No open wounds. No skin lesions.  Orthopedic: Normal joint ROM without pain or crepitus bilaterally. No visible deformities. Tender to palpation at the calcaneal tuber bilaterally. No pain with calcaneal squeeze bilaterally. Ankle ROM diminished range of motion  bilaterally. Silfverskiold Test: positive bilaterally.   Radiographs: Taken and reviewed. No acute fractures or dislocations. No evidence of stress fracture.  Plantar heel spur present. Posterior heel spur present.  Haglund's deformity noted bilaterally  Assessment:   1. Foot pain, bilateral   2. Plantar fasciitis of right foot   3. Plantar fasciitis of left foot    Plan:  Patient was evaluated and treated and all questions answered.  Plantar Fasciitis, bilaterally - XR reviewed as above.  - Educated on icing and stretching. Instructions given.  - Injection delivered to the plantar fascia as below. - DME: Plantar Fascial Brace x2 - Pharmacologic management: None  Patient has orthotics that were made over 20 years ago which are still in good shape.  I have asked the patient to continue wearing these orthotics at this will help with the long-term management of plantar fasciitis.  Procedure: Injection Tendon/Ligament Location: Bilateral plantar fascia at the glabrous junction; medial approach. Skin Prep: alcohol Injectate: 0.5 cc 0.5% marcaine plain, 0.5 cc of 1% Lidocaine, 0.5 cc kenalog 10. Disposition: Patient tolerated procedure well. Injection site dressed with a band-aid.  No follow-ups on file.

## 2019-05-14 ENCOUNTER — Ambulatory Visit: Payer: Medicare Other | Attending: Internal Medicine

## 2019-05-14 DIAGNOSIS — Z23 Encounter for immunization: Secondary | ICD-10-CM | POA: Insufficient documentation

## 2019-05-14 NOTE — Progress Notes (Signed)
   Covid-19 Vaccination Clinic  Name:  Kristen Suarez    MRN: [id number] DOB: [birth year]  05/14/2019  [name] was observed post Covid-19 immunization for 15 minutes without incidence. She was provided with Vaccine Information Sheet and instruction to access the V-Safe system.   [name] was instructed to call 911 with any severe reactions post vaccine: Marland Kitchen Difficulty breathing  . Swelling of your face and throat  . A fast heartbeat  . A bad rash all over your body  . Dizziness and weakness    Immunizations Administered    Name Date Dose VIS Date Route   Pfizer COVID-19 Vaccine 05/14/2019 11:12 AM 0.3 mL 02/26/2019 Intramuscular   Manufacturer: [place]   Lot: [id number]   [place]: [id number]

## 2019-05-16 ENCOUNTER — Ambulatory Visit (INDEPENDENT_AMBULATORY_CARE_PROVIDER_SITE_OTHER): Payer: Medicare Other | Admitting: *Deleted

## 2019-05-16 DIAGNOSIS — R55 Syncope and collapse: Secondary | ICD-10-CM | POA: Diagnosis not present

## 2019-05-16 LAB — CUP PACEART REMOTE DEVICE CHECK
Date Time Interrogation Session: 20210228022753
Implantable Pulse Generator Implant Date: 20190109

## 2019-05-16 NOTE — Progress Notes (Signed)
ILR remote 

## 2019-05-24 NOTE — Telephone Encounter (Signed)
Yes, OK to try making the change to Incruse

## 2019-05-25 MED ORDER — INCRUSE ELLIPTA 62.5 MCG/INH IN AEPB: 1.0000 | INHALATION_SPRAY | Freq: Every day | RESPIRATORY_TRACT | 5 refills | Status: DC

## 2019-06-04 ENCOUNTER — Ambulatory Visit (INDEPENDENT_AMBULATORY_CARE_PROVIDER_SITE_OTHER): Payer: Medicare Other | Admitting: Podiatry

## 2019-06-04 ENCOUNTER — Other Ambulatory Visit: Payer: Self-pay

## 2019-06-04 DIAGNOSIS — M79671 Pain in right foot: Secondary | ICD-10-CM | POA: Diagnosis not present

## 2019-06-04 DIAGNOSIS — M722 Plantar fascial fibromatosis: Secondary | ICD-10-CM | POA: Diagnosis not present

## 2019-06-04 DIAGNOSIS — M79672 Pain in left foot: Secondary | ICD-10-CM | POA: Diagnosis not present

## 2019-06-04 NOTE — Patient Instructions (Signed)

## 2019-06-07 ENCOUNTER — Encounter: Payer: Self-pay | Admitting: Podiatry

## 2019-06-07 NOTE — Progress Notes (Signed)
Subjective:  Patient ID: Kristen Suarez, female    DOB: 06/04/1945,  MRN: [id number]  Chief Complaint  Patient presents with  . Plantar Fasciitis    pt is here for bil plantar fasciitis of both feet, pt states that she is feeling alot better since the last time she was here, pt states that she feels more tenderness than actual pain, pt has no other comments or concerns    74 y.o. female presents with the above complaint.  Patient presents with a follow-up of bilateral heel pain diagnosed with plantar fasciitis.  Patient states that she has no pain anymore.  She states that she does still have some tenderness associated with it.  She denies any other acute complaints.  She has been wearing her bracing.  She also brought with her the orthotics that were made 20 years ago which are in good shape.  However she has not been wearing those orthotics.  I encouraged her to start wearing those orthotics.  She denies any other acute complaints.   Review of Systems: Negative except as noted in the HPI. Denies N/V/F/Ch.  Past Medical History:  Diagnosis Date  . Arthritis   . Asthma   . Chickenpox   . Colon polyps 2010  . Heart murmur   . History of fainting spells of unknown cause   . Hyperlipidemia   . Hypertension   . Sarcoid 06/16/1976    Current Outpatient Medications:  .  albuterol (PROVENTIL) (2.5 MG/3ML) 0.083% nebulizer solution, Take 3 mLs (2.5 mg total) by nebulization every 6 (six) hours., Disp: 150 mL, Rfl: 2 .  amLODipine (NORVASC) 5 MG tablet, Take 1 tablet (5 mg total) by mouth daily., Disp: 180 tablet, Rfl: 3 .  CALCIUM PO, Take 1 tablet by mouth daily. , Disp: , Rfl:  .  docusate sodium (STOOL SOFTENER LAXATIVE) 100 MG capsule, Take 100 mg by mouth daily. , Disp: , Rfl:  .  famotidine (PEPCID) 10 MG tablet, Take 10 mg by mouth daily. PRN , Disp: , Rfl:  .  fluticasone-salmeterol (ADVAIR HFA) 115-21 MCG/ACT inhaler, Inhale 2 puffs into the lungs 2 (two) times daily., Disp: 3  Inhaler, Rfl: 1 .  furosemide (LASIX) 20 MG tablet, Take 1 tablet (20 mg total) by mouth 3 (three) times a week., Disp: 12 tablet, Rfl: 2 .  lisinopril (ZESTRIL) 40 MG tablet, Take 1 tablet (40 mg total) by mouth daily., Disp: 90 tablet, Rfl: 1 .  meloxicam (MOBIC) 7.5 MG tablet, Take 1 tablet (7.5 mg total) by mouth daily. With food, Disp: 30 tablet, Rfl: 0 .  Multiple Vitamins-Minerals (MULTIVITAL PO), Take 1 tablet by mouth daily. , Disp: , Rfl:  .  potassium chloride SA (KLOR-CON) 20 MEQ tablet, Take 1 tablet (20 mEq total) by mouth 3 (three) times a week., Disp: 12 tablet, Rfl: 2 .  Probiotic Product (PROBIOTIC DAILY PO), Take 1 tablet by mouth daily. , Disp: , Rfl:  .  simvastatin (ZOCOR) 20 MG tablet, Take 1 tablet (20 mg total) by mouth daily., Disp: 90 tablet, Rfl: 0 .  SPIRIVA RESPIMAT 2.5 MCG/ACT AERS, Inhale 2 puffs into the lungs daily., Disp: 12 g, Rfl: 3 .  umeclidinium bromide (INCRUSE ELLIPTA) 62.5 MCG/INH AEPB, Inhale 1 puff into the lungs daily., Disp: 30 each, Rfl: 5 .  Vaginal Lubricant (REPLENS VA), Place vaginally. cream, Disp: , Rfl:   Social History   Tobacco Use  Smoking Status Never Smoker  Smokeless Tobacco Never Used    Allergies  Allergen Reactions  . Penicillins Itching  . Sulfa Antibiotics Itching  . Keflex [Cephalexin] Rash   Objective:  There were no vitals filed for this visit. There is no height or weight on file to calculate BMI. Constitutional Well developed. Well nourished.  Vascular Dorsalis pedis pulses palpable bilaterally. Posterior tibial pulses palpable bilaterally. Capillary refill normal to all digits.  No cyanosis or clubbing noted. Pedal hair growth normal.  Neurologic Normal speech. Oriented to person, place, and time. Epicritic sensation to light touch grossly present bilaterally.  Dermatologic Nails well groomed and normal in appearance. No open wounds. No skin lesions.  Orthopedic: Normal joint ROM without pain or crepitus  bilaterally. No visible deformities. No pain/tenderness at the calcaneal tuber bilaterally. No pain with calcaneal squeeze bilaterally. Ankle ROM diminished range of motion bilaterally. Silfverskiold Test: positive bilaterally.   Radiographs: Taken and reviewed. No acute fractures or dislocations. No evidence of stress fracture.  Plantar heel spur present. Posterior heel spur present.  Haglund's deformity noted bilaterally  Assessment:   1. Foot pain, bilateral   2. Plantar fasciitis of right foot   3. Plantar fasciitis of left foot    Plan:  Patient was evaluated and treated and all questions answered.  Plantar Fasciitis, bilaterally - XR reviewed as above.  - Educated on icing and stretching. Instructions given.  -Hold off on injection for now as she does not have any more pain. - DME: None - Pharmacologic management: None  -Patient will try to wear the orthotics that she had made 20 years ago.  I have asked her to start weaning them and slowly progressed to wearing them continuously throughout the day.  I explained to her what a breaking in period was. -If she starts getting arch related pain due to those orthotics we will plan on getting new pair of orthotics.   No follow-ups on file.

## 2019-06-08 ENCOUNTER — Encounter: Payer: Self-pay | Admitting: Nurse Practitioner

## 2019-06-08 ENCOUNTER — Ambulatory Visit: Payer: Medicare Other | Attending: Internal Medicine

## 2019-06-08 DIAGNOSIS — Z23 Encounter for immunization: Secondary | ICD-10-CM

## 2019-06-08 NOTE — Progress Notes (Signed)
   Covid-19 Vaccination Clinic  Name:  Kristen Suarez    MRN: [id number] DOB: 02-07-1946  06/08/2019  Kristen Suarez was observed post Covid-19 immunization for 30 minutes based on pre-vaccination screening without incident. She was provided with Vaccine Information Sheet and instruction to access the V-Safe system.   Kristen Suarez was instructed to call 911 with any severe reactions post vaccine: Marland Kitchen Difficulty breathing  . Swelling of face and throat  . A fast heartbeat  . A bad rash all over body  . Dizziness and weakness   Immunizations Administered    Name Date Dose VIS Date Route   Pfizer COVID-19 Vaccine 06/08/2019  1:23 PM 0.3 mL 02/26/2019 Intramuscular   Manufacturer: [place]   Lot: [id number]   [place]: [id number]

## 2019-06-16 ENCOUNTER — Ambulatory Visit (INDEPENDENT_AMBULATORY_CARE_PROVIDER_SITE_OTHER): Payer: Medicare Other | Admitting: *Deleted

## 2019-06-16 DIAGNOSIS — R55 Syncope and collapse: Secondary | ICD-10-CM

## 2019-06-16 LAB — CUP PACEART REMOTE DEVICE CHECK
Date Time Interrogation Session: 20210331033058
Implantable Pulse Generator Implant Date: 20190109

## 2019-06-16 NOTE — Progress Notes (Signed)
ILR Remote 

## 2019-06-25 NOTE — Telephone Encounter (Signed)
Dr. Lamonte Sakai please advise on patient mychart message:  Regarding Incruse Ellipta I have 11 doses left.  I have tried everything I know to do and yet most days during inhalation or just after I cough. It is most frustrating.  I have never had this issue with other inhalers. Prior to 2017 when inhalers were prescribed for daily use, I took Theophylline tablet 2x day.  Is that an option now? Looking for your guidance.  I will not be refilling Incruse Ellipta unless you instruct me to do so and provide me with a technique for getting the medication into my lungs. Thank you so much. Be safe out there.

## 2019-06-29 NOTE — Telephone Encounter (Signed)
We can look for other alternatives on her insurance formulary. She used to be on Spiriva, changed to incruse due to insurance. If there are other options on the formulary then we can try them. Another alternative would be to document Incruse as a treatment failure and try to go back to Spiriva.

## 2019-07-14 ENCOUNTER — Encounter: Payer: Self-pay | Admitting: Nurse Practitioner

## 2019-07-14 DIAGNOSIS — I10 Essential (primary) hypertension: Secondary | ICD-10-CM

## 2019-07-14 MED ORDER — LISINOPRIL 40 MG PO TABS: 40.0000 mg | ORAL_TABLET | Freq: Every day | ORAL | 1 refills | Status: DC

## 2019-07-14 NOTE — Telephone Encounter (Signed)
Per the 4.9.21 e-mail chain, patient was e-mailed on 4.14.21 to see if she could bring a copy of her formulary by the office as she stated on 4.13.21 that she has [employer] Formulary at home.  Per patient's chart, she read that 4.14.21 message from Raquel Sarna this morning 07/14/19 and is now asking if any decision has been made on the replacement for her Incruse.  Did patient ever bring a copy of the formulary for Dr Lamonte Sakai to view?  E-mail sent to patient.

## 2019-07-14 NOTE — Progress Notes (Signed)
Office Visit Note  Patient: Kristen Suarez             Date of Birth: 1945-04-22           MRN: [id number]             PCP: Flossie Buffy, NP Referring: Flossie Buffy, NP Visit Date: 07/19/2019 Occupation: _0 @  Subjective:  New Patient (Initial Visit) (Bil foot swelling)   History of Present Illness: Kristen Suarez is a 74 y.o. female seen in consultation per request of her PCP.  According to patient at age 19 she was diagnosed with sarcoidosis when she presented with shortness of breath and fatigue.  She was placed on prednisone which she took for about 15 years.  She has been under care of Dr. Lamonte Sakai and her lung disease is stable.  She states in the last week of January she woke up with left foot swelling which moved to her right foot the next day.  She was also having lot of discomfort in her bilateral feet.  She states she was having difficulty walking and she was mostly bedridden.  She states she was bedridden for almost 10 days and she started having pain in almost all of her joints.  Swelling was limited to only feet.  She was seen by her PCP through televisit.  In the next week in the office.  She was given a prednisone taper for 5 days.  Labs were obtained and she was placed on Lasix and meloxicam.  She states the Lasix helped her with the swelling and meloxicam helped her with the pain management.  She came off meloxicam about 2 weeks ago and also Lasix.  Last week.  She also had cardiology evaluation which was normal per patient.  She was seen by podiatrist who diagnosed her with plantar fasciitis gave her cortisone injections to her feet and gave her braces.  She was referred to me as she had history of pain in multiple joints.  She is currently not having joint discomfort.  She states meloxicam took care of joint pain.  She is also has history of chronic bronchitis for which she takes prednisone for flares.  Although she has not had any bronchitis flares in the last  year.  Activities of Daily Living:  Patient reports morning stiffness for 1/2 hours.   Patient Denies nocturnal pain.  Difficulty dressing/grooming: Denies Difficulty climbing stairs: Denies Difficulty getting out of chair: Denies Difficulty using hands for taps, buttons, cutlery, and/or writing: Reports  Review of Systems  Constitutional: Positive for fatigue. Negative for night sweats, weight gain and weight loss.  HENT: Positive for mouth dryness. Negative for mouth sores, trouble swallowing, trouble swallowing and nose dryness.   Eyes: Positive for dryness. Negative for pain, redness and visual disturbance.  Respiratory: Negative for cough, shortness of breath and difficulty breathing.   Cardiovascular: Positive for swelling in legs/feet. Negative for chest pain, palpitations, hypertension and irregular heartbeat.  Gastrointestinal: Negative for blood in stool, constipation and diarrhea.  Endocrine: Positive for cold intolerance. Negative for increased urination.  Genitourinary: Negative for difficulty urinating and vaginal dryness.  Musculoskeletal: Positive for morning stiffness. Negative for arthralgias, joint pain, joint swelling, myalgias, muscle weakness, muscle tenderness and myalgias.  Skin: Negative for color change, rash, hair loss, skin tightness, ulcers and sensitivity to sunlight.  Allergic/Immunologic: Negative for susceptible to infections.  Neurological: Negative for dizziness, numbness, memory loss, night sweats and weakness.  Hematological: Negative for bruising/bleeding tendency and swollen  glands.  Psychiatric/Behavioral: Negative for depressed mood and sleep disturbance. The patient is nervous/anxious.     PMFS History:  Patient Active Problem List   Diagnosis Date Noted  . Multiple joint pain 04/22/2019  . Bilateral leg edema 04/22/2019  . Vaginal atrophy 01/05/2019  . Resting tremor 09/08/2018  . Sarcoidosis 05/08/2018  . HTN (hypertension), benign  05/08/2018  . Colon polyp 05/08/2018  . Family hx of colon cancer 05/08/2018  . Cystocele, midline 05/08/2018  . Hyperlipidemia 05/08/2018  . Vasovagal syncope 05/08/2018  . Seborrheic keratosis 05/08/2018  . Pseudophakia of both eyes 05/08/2018  . GERD (gastroesophageal reflux disease) 05/08/2018  . COPD (chronic obstructive pulmonary disease) ([place]) 05/08/2018    Past Medical History:  Diagnosis Date  . Arthritis   . Asthma   . Chickenpox   . Colon polyps 2010  . Heart murmur   . History of fainting spells of unknown cause   . Hyperlipidemia   . Hypertension   . Sarcoid 06/16/1976    Family History  Problem Relation Age of Onset  . Arthritis Mother   . Cancer Mother   . Depression Mother   . Heart attack Mother   . Hyperlipidemia Mother   . Hypertension Mother   . Alcohol abuse Father   . Heart disease Father   . Hypertension Father   . Hyperlipidemia Father   . Heart disease Sister   . Stroke Sister   . Alcohol abuse Brother   . Heart disease Brother   . Hypertension Brother   . Hyperlipidemia Brother   . Multiple sclerosis Daughter   . Hyperlipidemia Son   . Heart disease Maternal Grandfather   . Heart disease Paternal Grandfather   . Hyperlipidemia Sister   . Alcohol abuse Sister   . Hyperlipidemia Sister   . Hypertension Sister   . Alcohol abuse Brother   . Early death Brother   . Heart attack Brother   . Heart disease Brother   . Hyperlipidemia Brother    Past Surgical History:  Procedure Laterality Date  . ABDOMINAL HYSTERECTOMY  1987  . LUNG BIOPSY  06/16/1976   Social History   Social History Narrative  . Not on file   Immunization History  Administered Date(s) Administered  . Fluad Quad(high Dose 65+) 11/19/2018  . Influenza, High Dose Seasonal PF 12/11/2017  . PFIZER SARS-COV-2 Vaccination 05/14/2019, 06/08/2019  . Pneumococcal Conjugate-13 03/23/2014  . Pneumococcal Polysaccharide-23 03/24/2012  . Tdap 03/24/2012      Objective: Vital Signs: BP (!) 145/77 (BP Location: Right Arm, Patient Position: Sitting, Cuff Size: Normal)   Pulse 71   Resp 16   Ht 5' 2.75" (1.594 m)   Wt 147 lb (66.7 kg)   BMI 26.25 kg/m    Physical Exam Vitals and nursing note reviewed.  Constitutional:      Appearance: She is well-developed.  HENT:     Head: Normocephalic and atraumatic.  Eyes:     Conjunctiva/sclera: Conjunctivae normal.  Cardiovascular:     Rate and Rhythm: Normal rate and regular rhythm.     Heart sounds: Normal heart sounds.  Pulmonary:     Effort: Pulmonary effort is normal.     Breath sounds: Normal breath sounds.  Abdominal:     General: Bowel sounds are normal.     Palpations: Abdomen is soft.  Musculoskeletal:     Cervical back: Normal range of motion.     Right lower leg: Edema present.     Left lower leg:  Edema present.  Lymphadenopathy:     Cervical: No cervical adenopathy.  Skin:    General: Skin is warm and dry.     Capillary Refill: Capillary refill takes less than 2 seconds.  Neurological:     Mental Status: She is alert and oriented to person, place, and time.  Psychiatric:        Behavior: Behavior normal.      Musculoskeletal Exam: C-spine was in good range of motion.  She has significant thoracic kyphosis.  Shoulder joints elbow joints wrist joints with good range of motion.  She is in complete fist formation.  She has no synovitis over MCPs.  She has PIP and DIP thickening.  Hip joints, knee joints in good range of motion.  She has bilateral hallux valgus deformity.  She had no synovitis on her feet.  CDAI Exam: CDAI Score: -- Patient Global: --; Provider Global: -- Swollen: --; Tender: -- Joint Exam 07/19/2019   No joint exam has been documented for this visit   There is currently no information documented on the homunculus. Go to the Rheumatology activity and complete the homunculus joint exam.  Investigation: No additional findings.  Imaging: CUP PACEART  REMOTE DEVICE CHECK  Result Date: 07/17/2019 Carelink summary report received. Battery status OK. Normal device function. No new symptom episodes, tachy episodes, brady, or pause episodes. No new AF episodes. Monthly summary reports and ROV/PRN   Recent Labs: Lab Results  Component Value Date   WBC 9.0 04/21/2019   HGB 13.5 04/21/2019   PLT 303.0 04/21/2019   NA 137 05/05/2019   K 4.5 05/05/2019   CL 98 05/05/2019   CO2 24 05/05/2019   GLUCOSE 115 (H) 05/05/2019   BUN 22 05/05/2019   CREATININE 1.11 (H) 05/05/2019   BILITOT 0.5 09/08/2018   ALKPHOS 78 09/08/2018   AST 19 09/08/2018   ALT 21 09/08/2018   PROT 6.6 09/08/2018   ALBUMIN 4.3 09/08/2018   CALCIUM 10.3 05/05/2019   GFRAA 57 (L) 05/05/2019    Speciality Comments: No specialty comments available.  Procedures:  No procedures performed Allergies: Penicillins, Sulfa antibiotics, and Keflex [cephalexin]   Assessment / Plan:     Visit Diagnoses: Polyarthralgia - 04/21/19: CK 52, TSH 1.87, ESR 77, CRP 2.5, ANA- -patient gives history of an episode of increased pain and swelling in her bilateral feet which is started in January and then the symptoms gradually improved.  She states she was given prednisone taper for 5 days followed by Mobic and Lasix.  Today she has no joint discomfort.  She has mild pitting edema on her bilateral feet.  She has underlying osteoarthritis in her hands.  She also has osteoarthritis in her feet.  She had x-rays done by her podiatrist which I reviewed and they were consistent with osteoarthritis.  She had cortisone injections to her bilateral feet for plantar fasciitis and was given braces.  The symptoms are improving.  Her sed rate was elevated.  I will repeat labs today.  She has no muscle weakness and had no difficulty getting up from the chair.  Plan: Sedimentation rate, Uric acid, Cyclic citrul peptide antibody, IgG, Rheumatoid factor  Primary osteoarthritis of both hands-the clinical findings are  consistent with osteoarthritis.  Have given her a handout on hand exercises and joint protection was discussed.  Primary osteoarthritis of both feet-she has bilateral pes planus.  I also reviewed x-rays done by her podiatrist which were consistent with osteoarthritis.  She was also diagnosed with  plantar fasciitis.   Sarcoidosis -patient was diagnosed with sarcoidosis at age 89 and was treated with prednisone for 15 years.  She states she has been followed by Dr. Lamonte Sakai.  She has not had any flares.  Plan: Angiotensin converting enzyme  Pedal edema-she has mild pitting pedal edema on her feet today.  She states she had a lot of swelling recently which resolved after the use of Lasix.  HTN (hypertension), benign-blood pressure is mildly elevated.  History of hyperlipidemia  Chronic bronchitis, unspecified chronic bronchitis type (HCC)  Hx of colonic polyps  History of gastroesophageal reflux (GERD)  Seborrheic keratosis  Pseudophakia of both eyes  Resting tremor  Family history of multiple sclerosis - Daughter  Orders: Orders Placed This Encounter  Procedures  . Sedimentation rate  . Uric acid  . Cyclic citrul peptide antibody, IgG  . Rheumatoid factor  . Angiotensin converting enzyme   No orders of the defined types were placed in this encounter.   Face-to-face time spent with patient was 50 minutes. Greater than 50% of time was spent in counseling and coordination of care.  Follow-Up Instructions: Return for Sarcoidosis.   Bo Merino, MD  Note - This record has been created using Editor, commissioning.  Chart creation errors have been sought, but may not always  have been located. Such creation errors do not reflect on  the standard of medical care.

## 2019-07-14 NOTE — Telephone Encounter (Signed)
Dr Lamonte Sakai, Patient was able to send [place] formulary information that she has at home.  Please advise if any of these would be appropriate changes from her Incruse, thank you.  Anoro Elliot aer 62.5-25 Tier 2 Bevespi aer 9-4.110mcg Tier 2 Combivent aer 20-100 Tier 3 Ipratropium-albuterol nebu soon 0.5-2.5(3) mg/49ml Tier 1 Trelegy Aer Ellipta Tier 2  Anticholinergics Atrovent HFA AERS 55mcg/act Tier 3 Incruse Ellipt AEPB 62.91mcg/inh Tier 2  Ipratropium bromide SOLN .02%  Tier 1 B/D-covered Ipratropium bromide (nasal) SOLN .03%, .06% Tier 1

## 2019-07-17 LAB — CUP PACEART REMOTE DEVICE CHECK
Date Time Interrogation Session: 20210501033458
Implantable Pulse Generator Implant Date: 20190109

## 2019-07-19 ENCOUNTER — Encounter: Payer: Self-pay | Admitting: Rheumatology

## 2019-07-19 ENCOUNTER — Other Ambulatory Visit: Payer: Self-pay

## 2019-07-19 ENCOUNTER — Ambulatory Visit (INDEPENDENT_AMBULATORY_CARE_PROVIDER_SITE_OTHER): Payer: Medicare Other | Admitting: Rheumatology

## 2019-07-19 ENCOUNTER — Ambulatory Visit (INDEPENDENT_AMBULATORY_CARE_PROVIDER_SITE_OTHER): Payer: Medicare Other | Admitting: *Deleted

## 2019-07-19 VITALS — BP 145/77 | HR 71 | Resp 16 | Ht 62.75 in | Wt 147.0 lb

## 2019-07-19 DIAGNOSIS — Z820 Family history of epilepsy and other diseases of the nervous system: Secondary | ICD-10-CM

## 2019-07-19 DIAGNOSIS — Z8719 Personal history of other diseases of the digestive system: Secondary | ICD-10-CM

## 2019-07-19 DIAGNOSIS — M19072 Primary osteoarthritis, left ankle and foot: Secondary | ICD-10-CM

## 2019-07-19 DIAGNOSIS — R55 Syncope and collapse: Secondary | ICD-10-CM | POA: Diagnosis not present

## 2019-07-19 DIAGNOSIS — D869 Sarcoidosis, unspecified: Secondary | ICD-10-CM | POA: Diagnosis not present

## 2019-07-19 DIAGNOSIS — Z961 Presence of intraocular lens: Secondary | ICD-10-CM

## 2019-07-19 DIAGNOSIS — M19042 Primary osteoarthritis, left hand: Secondary | ICD-10-CM

## 2019-07-19 DIAGNOSIS — Z860100 Personal history of colon polyps, unspecified: Secondary | ICD-10-CM

## 2019-07-19 DIAGNOSIS — M2550 Pain in unspecified joint: Secondary | ICD-10-CM | POA: Diagnosis not present

## 2019-07-19 DIAGNOSIS — G252 Other specified forms of tremor: Secondary | ICD-10-CM

## 2019-07-19 DIAGNOSIS — M19041 Primary osteoarthritis, right hand: Secondary | ICD-10-CM

## 2019-07-19 DIAGNOSIS — Z8639 Personal history of other endocrine, nutritional and metabolic disease: Secondary | ICD-10-CM | POA: Diagnosis not present

## 2019-07-19 DIAGNOSIS — M19071 Primary osteoarthritis, right ankle and foot: Secondary | ICD-10-CM

## 2019-07-19 DIAGNOSIS — J42 Unspecified chronic bronchitis: Secondary | ICD-10-CM

## 2019-07-19 DIAGNOSIS — I10 Essential (primary) hypertension: Secondary | ICD-10-CM | POA: Diagnosis not present

## 2019-07-19 DIAGNOSIS — L821 Other seborrheic keratosis: Secondary | ICD-10-CM

## 2019-07-19 NOTE — Progress Notes (Signed)
Carelink Summary Report / Loop Recorder 

## 2019-07-21 LAB — CYCLIC CITRUL PEPTIDE ANTIBODY, IGG: Cyclic Citrullin Peptide Ab: <16 UNITS

## 2019-07-21 LAB — SEDIMENTATION RATE: Sed Rate: 14 mm/h (ref 0–30)

## 2019-07-21 LAB — URIC ACID: Uric Acid, Serum: 4.3 mg/dL (ref 2.5–7.0)

## 2019-07-21 LAB — RHEUMATOID FACTOR: Rheumatoid fact SerPl-aCnc: <14 IU/mL (ref <14)

## 2019-07-21 LAB — ANGIOTENSIN CONVERTING ENZYME: Angiotensin-Converting Enzyme: <5 U/L — ABNORMAL LOW (ref 9–67)

## 2019-07-21 NOTE — Progress Notes (Signed)
All the labs are within normal limits.  I will discuss results at the follow-up visit.

## 2019-07-22 DIAGNOSIS — J449 Chronic obstructive pulmonary disease, unspecified: Secondary | ICD-10-CM

## 2019-07-22 NOTE — Telephone Encounter (Signed)
Dr. Lamonte Sakai please advise on patient mychart message:  Any word yet on replacement for Incruse? Have about 14 days left on Advair & need refill.  Advair HFA 115/56mcg oral ing 120's 2 puffs twice a day, 3 month supply. Thank you.

## 2019-07-27 MED ORDER — FLUTICASONE-SALMETEROL 115-21 MCG/ACT IN AERO: 2.0000 | INHALATION_SPRAY | Freq: Two times a day (BID) | RESPIRATORY_TRACT | 1 refills | Status: DC

## 2019-07-27 NOTE — Telephone Encounter (Signed)
One possible strategy would be to replace the Incruse and Advair, consolidate with trelegy alone. This is tier 2 on her formulary.   If she is willing to do this, have her come get samples at the office if she is willing - that way we can insure that she tolerates the trelegy before we prescribe it.

## 2019-07-27 NOTE — Telephone Encounter (Signed)
Pt sent a mychart message stating that she is still waiting on a replacement inhaler for the incruse. She was also needing to have her advair refilled which I have taken care of.  I am going to send this to both Dr. Lamonte Sakai as well as pharmacy team in regards to recommendations for replacement inhaler for Incruse for pt.

## 2019-07-28 MED ORDER — TRELEGY ELLIPTA 200-62.5-25 MCG/INH IN AEPB: 1.0000 | INHALATION_SPRAY | Freq: Every day | RESPIRATORY_TRACT | 0 refills | Status: DC

## 2019-07-28 NOTE — Telephone Encounter (Signed)
Patient would like to pick up samples of trelegy but it is not specified which stength,   Dr. Lamonte Sakai please advise on trelegy 100 or 200 for patient.

## 2019-07-28 NOTE — Telephone Encounter (Signed)
Have her try Trelegy 200 please.  Make sure she understands that it substitutes for both her Incruse and Advair. Thanks.

## 2019-08-02 ENCOUNTER — Encounter: Payer: Self-pay | Admitting: Nurse Practitioner

## 2019-08-02 MED ORDER — SIMVASTATIN 20 MG PO TABS: 20.0000 mg | ORAL_TABLET | Freq: Every day | ORAL | 3 refills | Status: DC

## 2019-08-02 NOTE — Progress Notes (Signed)
Office Visit Note  Patient: Kristen Suarez             Date of Birth: [birth year]           MRN: [id number]             PCP: Flossie Buffy, NP Referring: Flossie Buffy, NP Visit Date: 08/12/2019 Occupation: @GUAROCC @  Subjective:  Pain and stiffness in bilateral hands and feet.Marland Kitchen   History of Present Illness: Kristen Suarez is a 74 y.o. female with history of osteoarthritis and sarcoidosis.  She states she continues to have pain and stiffness in her hands and feet.  She has not noticed any joint swelling since her initial episode 4 months ago.  None of the other joints are painful.  Activities of Daily Living:  Patient reports morning stiffness for 30 minutes.   Patient Denies nocturnal pain.  Difficulty dressing/grooming: Denies Difficulty climbing stairs: Reports Difficulty getting out of chair: Reports Difficulty using hands for taps, buttons, cutlery, and/or writing: Denies  Review of Systems  Constitutional: Positive for fatigue. Negative for night sweats, weight gain and weight loss.  HENT: Negative for mouth sores, trouble swallowing, trouble swallowing, mouth dryness and nose dryness.   Eyes: Positive for dryness. Negative for pain, redness and visual disturbance.  Respiratory: Negative for cough, shortness of breath and difficulty breathing.   Cardiovascular: Negative for chest pain, palpitations, hypertension, irregular heartbeat and swelling in legs/feet.  Gastrointestinal: Negative for blood in stool, constipation and diarrhea.  Endocrine: Negative for increased urination.  Genitourinary: Negative for vaginal dryness.  Musculoskeletal: Positive for arthralgias, joint pain and morning stiffness. Negative for joint swelling, myalgias, muscle weakness, muscle tenderness and myalgias.  Skin: Negative for color change, rash, hair loss, skin tightness, ulcers and sensitivity to sunlight.  Allergic/Immunologic: Negative for susceptible to infections.    Neurological: Negative for dizziness, memory loss, night sweats and weakness.  Hematological: Negative for swollen glands.  Psychiatric/Behavioral: Positive for sleep disturbance. Negative for depressed mood. The patient is not nervous/anxious.     PMFS History:  Patient Active Problem List   Diagnosis Date Noted  . Multiple joint pain 04/22/2019  . Bilateral leg edema 04/22/2019  . Vaginal atrophy 01/05/2019  . Resting tremor 09/08/2018  . Sarcoidosis 05/08/2018  . HTN (hypertension), benign 05/08/2018  . Colon polyp 05/08/2018  . Family hx of colon cancer 05/08/2018  . Cystocele, midline 05/08/2018  . Hyperlipidemia 05/08/2018  . Vasovagal syncope 05/08/2018  . Seborrheic keratosis 05/08/2018  . Pseudophakia of both eyes 05/08/2018  . GERD (gastroesophageal reflux disease) 05/08/2018  . COPD (chronic obstructive pulmonary disease) ([place]) 05/08/2018    Past Medical History:  Diagnosis Date  . Arthritis   . Asthma   . Chickenpox   . Colon polyps 2010  . Heart murmur   . History of fainting spells of unknown cause   . Hyperlipidemia   . Hypertension   . Sarcoid 06/16/1976    Family History  Problem Relation Age of Onset  . Arthritis Mother   . Cancer Mother   . Depression Mother   . Heart attack Mother   . Hyperlipidemia Mother   . Hypertension Mother   . Alcohol abuse Father   . Heart disease Father   . Hypertension Father   . Hyperlipidemia Father   . Heart disease Sister   . Stroke Sister   . Alcohol abuse Brother   . Heart disease Brother   . Hypertension Brother   . Hyperlipidemia Brother   .  Multiple sclerosis Daughter   . Hyperlipidemia Son   . Heart disease Maternal Grandfather   . Heart disease Paternal Grandfather   . Hyperlipidemia Sister   . Alcohol abuse Sister   . Hyperlipidemia Sister   . Hypertension Sister   . Alcohol abuse Brother   . Early death Brother   . Heart attack Brother   . Heart disease Brother   . Hyperlipidemia Brother     Past Surgical History:  Procedure Laterality Date  . ABDOMINAL HYSTERECTOMY  1987  . LUNG BIOPSY  06/16/1976   Social History   Social History Narrative  . Not on file   Immunization History  Administered Date(s) Administered  . Fluad Quad(high Dose 65+) 11/19/2018  . Influenza, High Dose Seasonal PF 12/11/2017  . PFIZER SARS-COV-2 Vaccination 05/14/2019, 06/08/2019  . Pneumococcal Conjugate-13 03/23/2014  . Pneumococcal Polysaccharide-23 03/24/2012  . Tdap 03/24/2012     Objective: Vital Signs: BP (!) 153/78 (BP Location: Left Arm, Patient Position: Sitting, Cuff Size: Normal)   Pulse 78   Resp 16   Ht 5' 2.75" (1.594 m)   Wt 147 lb 9.6 oz (67 kg)   BMI 26.35 kg/m    Physical Exam Vitals and nursing note reviewed.  Constitutional:      Appearance: She is well-developed.  HENT:     Head: Normocephalic and atraumatic.  Eyes:     Conjunctiva/sclera: Conjunctivae normal.  Cardiovascular:     Rate and Rhythm: Normal rate and regular rhythm.     Heart sounds: Normal heart sounds.  Pulmonary:     Effort: Pulmonary effort is normal.     Breath sounds: Normal breath sounds.  Abdominal:     General: Bowel sounds are normal.     Palpations: Abdomen is soft.  Musculoskeletal:     Cervical back: Normal range of motion.  Lymphadenopathy:     Cervical: No cervical adenopathy.  Skin:    General: Skin is warm and dry.     Capillary Refill: Capillary refill takes less than 2 seconds.  Neurological:     Mental Status: She is alert and oriented to person, place, and time.  Psychiatric:        Behavior: Behavior normal.      Musculoskeletal Exam: C-spine was in good range of motion.  She has significant postural kyphosis.  Shoulder joints, elbow joints, wrist joints with good range of motion.  She has incomplete fist formation bilaterally with DIP and PIP thickening.  Hip joints, knee joints with good range of motion.  She has PIP and DIP thickening in her feet but no  synovitis.  CDAI Exam: CDAI Score: -- Patient Global: --; Provider Global: -- Swollen: --; Tender: -- Joint Exam 08/12/2019   No joint exam has been documented for this visit   There is currently no information documented on the homunculus. Go to the Rheumatology activity and complete the homunculus joint exam.  Investigation: No additional findings.  Imaging: CUP PACEART REMOTE DEVICE CHECK  Result Date: 07/17/2019 Carelink summary report received. Battery status OK. Normal device function. No new symptom episodes, tachy episodes, brady, or pause episodes. No new AF episodes. Monthly summary reports and ROV/PRN   Recent Labs: Lab Results  Component Value Date   WBC 9.0 04/21/2019   HGB 13.5 04/21/2019   PLT 303.0 04/21/2019   NA 137 05/05/2019   K 4.5 05/05/2019   CL 98 05/05/2019   CO2 24 05/05/2019   GLUCOSE 115 (H) 05/05/2019   BUN 22  05/05/2019   CREATININE 1.11 (H) 05/05/2019   BILITOT 0.5 09/08/2018   ALKPHOS 78 09/08/2018   AST 19 09/08/2018   ALT 21 09/08/2018   PROT 6.6 09/08/2018   ALBUMIN 4.3 09/08/2018   CALCIUM 10.3 05/05/2019   GFRAA 57 (L) 05/05/2019  Jul 19, 2019 ESR 14, uric acid 4.3, RF negative, anti-CCP negative, ACE negative  Speciality Comments: No specialty comments available.  Procedures:  No procedures performed Allergies: Penicillins, Sulfa antibiotics, and Keflex [cephalexin]   Assessment / Plan:     Visit Diagnoses: Polyarthralgia -patient had an episode of increased pain in her feet about 4 months ago which is resolved now.  Have uncertain if it is due to pedal edema.  Repeat sed rate is normal.  Patient had no synovitis on examination.  She had pedal edema.  She gives history of recurrent plantar fasciitis.  She has been doing exercises for plantar fasciitis.  Sarcoidosis - patient was diagnosed with sarcoidosis at age 57 and was treated with prednisone for 15 years.She states she has been followed by Dr. Lamonte Sakai.patient denies any  flares.  Her wrist level was normal.  Primary osteoarthritis of both hands[-she has severe osteoarthritis in her hands with incomplete fist formation.  Hand exercises were demonstrated in the office today.  Natural anti-inflammatories were discussed.  Primary osteoarthritis of both feet-she has been wearing shoes with arch support which has been helpful.  Postural kyphosis of thoracic region-she has significant postural kyphosis.  For which exercises were demonstrated in the office today.  I also advised her to get a DEXA through her PCP.  We will discuss DEXA results at the follow-up visit.  HTN (hypertension), benign  History of hyperlipidemia  Chronic bronchitis, unspecified chronic bronchitis type (HCC)  History of gastroesophageal reflux (GERD)  Hx of colonic polyps  Seborrheic keratosis  Pseudophakia of both eyes  Resting tremor  Family history of multiple sclerosis  Orders: No orders of the defined types were placed in this encounter.  No orders of the defined types were placed in this encounter.     Follow-Up Instructions: Return in about 6 months (around 02/12/2020) for Osteoarthritis.   Bo Merino, MD  Note - This record has been created using Editor, commissioning.  Chart creation errors have been sought, but may not always  have been located. Such creation errors do not reflect on  the standard of medical care.

## 2019-08-04 NOTE — Telephone Encounter (Signed)
Dr.Byrum can you please advise. Thank you      Patient stated: RE: Trelegy Began on 5/13.  Have not taken yet today.  Of the 6 doses I can only be sure of 3 doses without a big cough.  I woke up yesterday, Tuesday, May 18 with nausea.  I had soft bowel movements 3 times.  I had water, ginger ale, a few plain crackers, 4 tablespoons of rice.  The nausea and queasy stomach continue. IF it is the Trelegy, will it pass?  Today I had a little Cheerios with a little  almond milk.  Today I plan on following the old BRAT diet to help me through.  I haven't gone anywhere without a mask, nor have I eaten anything out of the ordinary.  My usual low energy state is now even lower.    Thanks, in advance, for any guidance you can send my way.

## 2019-08-05 NOTE — Telephone Encounter (Signed)
It would be unusual for the trelegy to cause GI symptoms. I think it would be worth it to do a retry starting the trelegy after her GI symptoms resolve to help [place] determine whether there is truly a connection.

## 2019-08-11 ENCOUNTER — Other Ambulatory Visit: Payer: Self-pay

## 2019-08-11 ENCOUNTER — Ambulatory Visit (INDEPENDENT_AMBULATORY_CARE_PROVIDER_SITE_OTHER): Payer: Medicare Other | Admitting: Podiatry

## 2019-08-11 DIAGNOSIS — M79671 Pain in right foot: Secondary | ICD-10-CM

## 2019-08-11 DIAGNOSIS — M79672 Pain in left foot: Secondary | ICD-10-CM

## 2019-08-11 DIAGNOSIS — M722 Plantar fascial fibromatosis: Secondary | ICD-10-CM

## 2019-08-12 ENCOUNTER — Encounter: Payer: Self-pay | Admitting: Nurse Practitioner

## 2019-08-12 ENCOUNTER — Ambulatory Visit (INDEPENDENT_AMBULATORY_CARE_PROVIDER_SITE_OTHER): Payer: Medicare Other | Admitting: Rheumatology

## 2019-08-12 ENCOUNTER — Encounter: Payer: Self-pay | Admitting: Rheumatology

## 2019-08-12 VITALS — BP 153/78 | HR 78 | Resp 16 | Ht 62.75 in | Wt 147.6 lb

## 2019-08-12 DIAGNOSIS — M19071 Primary osteoarthritis, right ankle and foot: Secondary | ICD-10-CM | POA: Diagnosis not present

## 2019-08-12 DIAGNOSIS — Z961 Presence of intraocular lens: Secondary | ICD-10-CM

## 2019-08-12 DIAGNOSIS — M2550 Pain in unspecified joint: Secondary | ICD-10-CM | POA: Diagnosis not present

## 2019-08-12 DIAGNOSIS — Z8639 Personal history of other endocrine, nutritional and metabolic disease: Secondary | ICD-10-CM

## 2019-08-12 DIAGNOSIS — M19072 Primary osteoarthritis, left ankle and foot: Secondary | ICD-10-CM

## 2019-08-12 DIAGNOSIS — L821 Other seborrheic keratosis: Secondary | ICD-10-CM

## 2019-08-12 DIAGNOSIS — Z820 Family history of epilepsy and other diseases of the nervous system: Secondary | ICD-10-CM

## 2019-08-12 DIAGNOSIS — M19041 Primary osteoarthritis, right hand: Secondary | ICD-10-CM | POA: Diagnosis not present

## 2019-08-12 DIAGNOSIS — M19042 Primary osteoarthritis, left hand: Secondary | ICD-10-CM

## 2019-08-12 DIAGNOSIS — M4004 Postural kyphosis, thoracic region: Secondary | ICD-10-CM

## 2019-08-12 DIAGNOSIS — Z8719 Personal history of other diseases of the digestive system: Secondary | ICD-10-CM

## 2019-08-12 DIAGNOSIS — D869 Sarcoidosis, unspecified: Secondary | ICD-10-CM | POA: Diagnosis not present

## 2019-08-12 DIAGNOSIS — Z860100 Personal history of colon polyps, unspecified: Secondary | ICD-10-CM

## 2019-08-12 DIAGNOSIS — J42 Unspecified chronic bronchitis: Secondary | ICD-10-CM

## 2019-08-12 DIAGNOSIS — I10 Essential (primary) hypertension: Secondary | ICD-10-CM

## 2019-08-12 DIAGNOSIS — G252 Other specified forms of tremor: Secondary | ICD-10-CM

## 2019-08-17 ENCOUNTER — Encounter: Payer: Self-pay | Admitting: Podiatry

## 2019-08-17 NOTE — Progress Notes (Signed)
Subjective:  Patient ID: Kristen Suarez, female    DOB: 1945/06/16,  MRN: [id number]  Chief Complaint  Patient presents with  . Foot Pain    pt is here for 8 week f/u, bil foot pain, pt states that she is doing a lot better since the last time she was here, pt states that she was unable to stop using plantar fascial braces, pain is now a 2 out of 10 on the pain scale.    74 y.o. female presents with the above complaint.  Patient presents with follow-up of bilateral plantar fasciitis.  Patient states that she does not have any pain anymore.  Her pain is being well maintained.  She has been wearing her plantar fascial braces.  She has tried on her orthotics which seems to be helping.  She is doing a lot better overall.  She denies any other acute complaints.  She would like to discuss any further treatment options.   Review of Systems: Negative except as noted in the HPI. Denies N/V/F/Ch.  Past Medical History:  Diagnosis Date  . Arthritis   . Asthma   . Chickenpox   . Colon polyps 2010  . Heart murmur   . History of fainting spells of unknown cause   . Hyperlipidemia   . Hypertension   . Sarcoid 06/16/1976    Current Outpatient Medications:  .  albuterol (PROVENTIL) (2.5 MG/3ML) 0.083% nebulizer solution, Take 3 mLs (2.5 mg total) by nebulization every 6 (six) hours., Disp: 150 mL, Rfl: 2 .  amLODipine (NORVASC) 5 MG tablet, Take 1 tablet (5 mg total) by mouth daily., Disp: 180 tablet, Rfl: 3 .  CALCIUM PO, Take 1 tablet by mouth daily. , Disp: , Rfl:  .  docusate sodium (STOOL SOFTENER LAXATIVE) 100 MG capsule, Take 100 mg by mouth daily. , Disp: , Rfl:  .  famotidine (PEPCID) 10 MG tablet, Take 10 mg by mouth daily. PRN , Disp: , Rfl:  .  fluticasone-salmeterol (ADVAIR HFA) 115-21 MCG/ACT inhaler, Inhale 2 puffs into the lungs 2 (two) times daily. (Patient not taking: Reported on 08/12/2019), Disp: 3 Inhaler, Rfl: 1 .  Fluticasone-Umeclidin-Vilant (TRELEGY ELLIPTA) 200-62.5-25  MCG/INH AEPB, Inhale 1 puff into the lungs daily., Disp: 28 each, Rfl: 0 .  lisinopril (ZESTRIL) 40 MG tablet, Take 1 tablet (40 mg total) by mouth daily., Disp: 90 tablet, Rfl: 1 .  Multiple Vitamins-Minerals (MULTIVITAL PO), Take 1 tablet by mouth daily. , Disp: , Rfl:  .  potassium chloride SA (KLOR-CON) 20 MEQ tablet, Take 1 tablet (20 mEq total) by mouth 3 (three) times a week., Disp: 12 tablet, Rfl: 2 .  Probiotic Product (PROBIOTIC DAILY PO), Take 1 tablet by mouth daily. , Disp: , Rfl:  .  simvastatin (ZOCOR) 20 MG tablet, Take 1 tablet (20 mg total) by mouth daily., Disp: 90 tablet, Rfl: 3 .  umeclidinium bromide (INCRUSE ELLIPTA) 62.5 MCG/INH AEPB, Inhale 1 puff into the lungs daily. (Patient not taking: Reported on 08/12/2019), Disp: 30 each, Rfl: 5 .  Vaginal Lubricant (REPLENS VA), Place vaginally. cream, Disp: , Rfl:   Social History   Tobacco Use  Smoking Status Never Smoker  Smokeless Tobacco Never Used    Allergies  Allergen Reactions  . Penicillins Itching  . Sulfa Antibiotics Itching  . Keflex [Cephalexin] Rash   Objective:  There were no vitals filed for this visit. There is no height or weight on file to calculate BMI. Constitutional Well developed. Well nourished.  Vascular Dorsalis  pedis pulses palpable bilaterally. Posterior tibial pulses palpable bilaterally. Capillary refill normal to all digits.  No cyanosis or clubbing noted. Pedal hair growth normal.  Neurologic Normal speech. Oriented to person, place, and time. Epicritic sensation to light touch grossly present bilaterally.  Dermatologic Nails well groomed and normal in appearance. No open wounds. No skin lesions.  Orthopedic: Normal joint ROM without pain or crepitus bilaterally. No visible deformities. No pain/tenderness at the calcaneal tuber bilaterally. No pain with calcaneal squeeze bilaterally. Ankle ROM diminished range of motion bilaterally. Silfverskiold Test: positive bilaterally.    Radiographs: Taken and reviewed. No acute fractures or dislocations. No evidence of stress fracture.  Plantar heel spur present. Posterior heel spur present.  Haglund's deformity noted bilaterally  Assessment:   1. Foot pain, bilateral   2. Plantar fasciitis of right foot   3. Plantar fasciitis of left foot    Plan:  Patient was evaluated and treated and all questions answered.  Plantar Fasciitis, bilaterally~resolved -Her pain is primarily resolved.  I discussed with her the long-term management is orthotics and stretching exercises.  Patient states understanding.  Her pain is much more manageable now.  -Patient will try to wear the orthotics that she had made 20 years ago.  I have asked her to start weaning them and slowly progressed to wearing them continuously throughout the day.  I explained to her what a breaking in period was. -If she starts getting arch related pain due to those orthotics we will plan on getting new pair of orthotics.   No follow-ups on file.

## 2019-08-20 LAB — CUP PACEART REMOTE DEVICE CHECK
Date Time Interrogation Session: 20210603231302
Implantable Pulse Generator Implant Date: 20190109

## 2019-08-24 NOTE — Telephone Encounter (Signed)
Probably the best substitution that is tier 2 on her formulary would be bevespi. Has she tried that one? If she is willing then would trial bevespi 2 puffs bid

## 2019-08-24 NOTE — Telephone Encounter (Signed)
RB please advise. Thanks.  

## 2019-08-27 MED ORDER — BEVESPI AEROSPHERE 9-4.8 MCG/ACT IN AERO: 2.0000 | INHALATION_SPRAY | Freq: Two times a day (BID) | RESPIRATORY_TRACT | 3 refills | Status: DC

## 2019-09-07 ENCOUNTER — Telehealth: Payer: Self-pay | Admitting: Emergency Medicine

## 2019-09-13 NOTE — Telephone Encounter (Signed)
error 

## 2019-09-23 ENCOUNTER — Ambulatory Visit (INDEPENDENT_AMBULATORY_CARE_PROVIDER_SITE_OTHER): Payer: Medicare Other | Admitting: *Deleted

## 2019-09-23 DIAGNOSIS — R55 Syncope and collapse: Secondary | ICD-10-CM | POA: Diagnosis not present

## 2019-09-23 LAB — CUP PACEART REMOTE DEVICE CHECK
Date Time Interrogation Session: 20210707232049
Implantable Pulse Generator Implant Date: 20190109

## 2019-09-24 NOTE — Progress Notes (Signed)
Carelink Summary Report / Loop Recorder 

## 2019-09-28 ENCOUNTER — Encounter: Payer: Self-pay | Admitting: Nurse Practitioner

## 2019-10-05 ENCOUNTER — Encounter: Payer: Self-pay | Admitting: Family

## 2019-10-05 ENCOUNTER — Other Ambulatory Visit: Payer: Self-pay

## 2019-10-05 ENCOUNTER — Telehealth (INDEPENDENT_AMBULATORY_CARE_PROVIDER_SITE_OTHER): Payer: Medicare Other | Admitting: Family

## 2019-10-05 VITALS — Ht 62.75 in | Wt 141.0 lb

## 2019-10-05 DIAGNOSIS — J302 Other seasonal allergic rhinitis: Secondary | ICD-10-CM | POA: Diagnosis not present

## 2019-10-05 DIAGNOSIS — R05 Cough: Secondary | ICD-10-CM | POA: Diagnosis not present

## 2019-10-05 DIAGNOSIS — R059 Cough, unspecified: Secondary | ICD-10-CM

## 2019-10-05 MED ORDER — PREDNISONE 10 MG (21) PO TBPK
ORAL_TABLET | ORAL | 0 refills | Status: DC
Start: 2019-10-05 — End: 2019-11-08

## 2019-10-05 NOTE — Progress Notes (Signed)
Virtual Visit via Video   I connected with patient on 10/05/19 at 10:00 AM EDT by a video enabled telemedicine application and verified that I am speaking with the correct person using two identifiers.  Location patient: Home Location provider: [facility], Office Persons participating in the virtual visit: Patient, Provider, CMA  I discussed the limitations of evaluation and management by telemedicine and the availability of in person appointments. The patient expressed understanding and agreed to proceed.  Subjective:   HPI:   74 year old female connect by video visit with concerns of a lingering cough x 5 weeks. She has nasal congestion and runny nose alternating, itchy eyes. She has been taking benadryl at night that helps but makes her too drowsy to take during the day and consistently.   ROS:   See pertinent positives and negatives per HPI.  Patient Active Problem List   Diagnosis Date Noted  . Multiple joint pain 04/22/2019  . Bilateral leg edema 04/22/2019  . Vaginal atrophy 01/05/2019  . Resting tremor 09/08/2018  . Sarcoidosis 05/08/2018  . HTN (hypertension), benign 05/08/2018  . Colon polyp 05/08/2018  . Family hx of colon cancer 05/08/2018  . Cystocele, midline 05/08/2018  . Hyperlipidemia 05/08/2018  . Vasovagal syncope 05/08/2018  . Seborrheic keratosis 05/08/2018  . Pseudophakia of both eyes 05/08/2018  . GERD (gastroesophageal reflux disease) 05/08/2018  . COPD (chronic obstructive pulmonary disease) ([place]) 05/08/2018    Social History   Tobacco Use  . Smoking status: Never Smoker  . Smokeless tobacco: Never Used  Substance Use Topics  . Alcohol use: Yes    Comment: once in a while    Current Outpatient Medications:  .  albuterol (PROVENTIL) (2.5 MG/3ML) 0.083% nebulizer solution, Take 3 mLs (2.5 mg total) by nebulization every 6 (six) hours., Disp: 150 mL, Rfl: 2 .  amLODipine (NORVASC) 5 MG tablet, Take 1 tablet (5 mg total) by  mouth daily., Disp: 180 tablet, Rfl: 3 .  CALCIUM PO, Take 1 tablet by mouth daily. , Disp: , Rfl:  .  docusate sodium (STOOL SOFTENER LAXATIVE) 100 MG capsule, Take 100 mg by mouth daily. , Disp: , Rfl:  .  famotidine (PEPCID) 10 MG tablet, Take 10 mg by mouth daily. PRN , Disp: , Rfl:  .  Fluticasone-Umeclidin-Vilant (TRELEGY ELLIPTA) 200-62.5-25 MCG/INH AEPB, Inhale 1 puff into the lungs daily., Disp: 28 each, Rfl: 0 .  Glycopyrrolate-Formoterol (BEVESPI AEROSPHERE) 9-4.8 MCG/ACT AERO, Inhale 2 puffs into the lungs in the morning and at bedtime., Disp: 5.9 g, Rfl: 3 .  lisinopril (ZESTRIL) 40 MG tablet, Take 1 tablet (40 mg total) by mouth daily., Disp: 90 tablet, Rfl: 1 .  Multiple Vitamins-Minerals (MULTIVITAL PO), Take 1 tablet by mouth daily. , Disp: , Rfl:  .  Omega-3 Fatty Acids (FISH OIL) 1000 MG CAPS, Take by mouth., Disp: , Rfl:  .  potassium chloride SA (KLOR-CON) 20 MEQ tablet, Take 1 tablet (20 mEq total) by mouth 3 (three) times a week., Disp: 12 tablet, Rfl: 2 .  Probiotic Product (PROBIOTIC DAILY PO), Take 1 tablet by mouth daily. , Disp: , Rfl:  .  simvastatin (ZOCOR) 20 MG tablet, Take 1 tablet (20 mg total) by mouth daily., Disp: 90 tablet, Rfl: 3 .  Turmeric (QC TUMERIC COMPLEX) 500 MG CAPS, Take by mouth., Disp: , Rfl:  .  Vaginal Lubricant (REPLENS VA), Place vaginally. cream, Disp: , Rfl:  .  fluticasone-salmeterol (ADVAIR HFA) 115-21 MCG/ACT inhaler, Inhale 2 puffs into  the lungs 2 (two) times daily. (Patient not taking: Reported on 10/05/2019), Disp: 3 Inhaler, Rfl: 1 .  predniSONE (STERAPRED UNI-PAK 21 TAB) 10 MG (21) TBPK tablet, As directed, Disp: 21 tablet, Rfl: 0 .  umeclidinium bromide (INCRUSE ELLIPTA) 62.5 MCG/INH AEPB, Inhale 1 puff into the lungs daily. (Patient not taking: Reported on 08/12/2019), Disp: 30 each, Rfl: 5  Allergies  Allergen Reactions  . Penicillins Itching  . Sulfa Antibiotics Itching  . Keflex [Cephalexin] Rash    Objective:   Ht 5'  2.75" (1.594 m)   Wt 141 lb (64 kg) Comment: pt reported  BMI 25.18 kg/m   Patient is well-developed, well-nourished in no acute distress.  Resting comfortably at home.  Head is normocephalic, atraumatic.  No labored breathing.  Speech is clear and coherent with logical content.  Patient is alert and oriented at baseline.    Assessment and Plan:     Jatavia was seen today for cough.  Diagnoses and all orders for this visit:  Cough  Seasonal allergies  Other orders -     predniSONE (STERAPRED UNI-PAK 21 TAB) 10 MG (21) TBPK tablet; As directed  Call the office with any questions or concerns. Follow-up as scehduled and as needed. Advised Zyrtec once daily once she finished Prednisone  Kennyth Arnold, FNP 10/05/2019

## 2019-10-25 DIAGNOSIS — J441 Chronic obstructive pulmonary disease with (acute) exacerbation: Secondary | ICD-10-CM

## 2019-10-25 DIAGNOSIS — R509 Fever, unspecified: Secondary | ICD-10-CM

## 2019-10-25 MED ORDER — ALBUTEROL SULFATE (2.5 MG/3ML) 0.083% IN NEBU: 2.5000 mg | INHALATION_SOLUTION | Freq: Four times a day (QID) | RESPIRATORY_TRACT | 2 refills | Status: DC

## 2019-10-26 ENCOUNTER — Ambulatory Visit (INDEPENDENT_AMBULATORY_CARE_PROVIDER_SITE_OTHER): Payer: Medicare Other | Admitting: *Deleted

## 2019-10-26 DIAGNOSIS — R55 Syncope and collapse: Secondary | ICD-10-CM

## 2019-10-26 LAB — CUP PACEART REMOTE DEVICE CHECK
Date Time Interrogation Session: 20210809232050
Implantable Pulse Generator Implant Date: 20190109

## 2019-10-27 NOTE — Progress Notes (Signed)
Carelink Summary Report / Loop Recorder 

## 2019-11-08 ENCOUNTER — Telehealth (INDEPENDENT_AMBULATORY_CARE_PROVIDER_SITE_OTHER): Payer: Medicare Other | Admitting: Family Medicine

## 2019-11-08 ENCOUNTER — Encounter: Payer: Self-pay | Admitting: Family Medicine

## 2019-11-08 VITALS — Temp 97.4°F | Ht 62.0 in | Wt 140.0 lb

## 2019-11-08 DIAGNOSIS — R059 Cough, unspecified: Secondary | ICD-10-CM

## 2019-11-08 DIAGNOSIS — R0982 Postnasal drip: Secondary | ICD-10-CM | POA: Insufficient documentation

## 2019-11-08 DIAGNOSIS — R05 Cough: Secondary | ICD-10-CM | POA: Diagnosis not present

## 2019-11-08 DIAGNOSIS — R053 Chronic cough: Secondary | ICD-10-CM | POA: Insufficient documentation

## 2019-11-08 MED ORDER — MOMETASONE FUROATE 50 MCG/ACT NA SUSP: 2.0000 | Freq: Every day | NASAL | 3 refills | Status: DC

## 2019-11-08 MED ORDER — PREDNISONE 20 MG PO TABS: 20.0000 mg | ORAL_TABLET | Freq: Two times a day (BID) | ORAL | 0 refills | Status: AC

## 2019-11-08 NOTE — Progress Notes (Signed)
Established Patient Office Visit  Subjective:  Patient ID: Kristen Suarez, female    DOB: 05/02/1945  Age: 74 y.o. MRN: [id number]  CC:  Chief Complaint  Patient presents with  . Follow-up    follow up on cough, per patient she can not seem to get over the cough that she's had for the past 6 weeks. Little body aches that come and go very restless due to coughing all night.     HPI Kristen Suarez presents for evaluation treatment of ongoing history of cough.  Cough is been essentially nonproductive without fever chills weight loss or night sweats.  She has a history of reactive airway disease associated with a distant history of sarcoid.  She does not smoke.  Currently uses Bevespi for this issue.  She has been using her albuterol inhaler with some relief.  She has ongoing postnasal drip and occasional sneeze with coughing fits.  She admits ongoing postnasal drip with sneeze associated with coughing fits.  History of allergy rhinitis.  She does have some nausea and stomach upset associated with the the drainage but denies frank reflux.  She has been on lisinopril for many years.  Past Medical History:  Diagnosis Date  . Arthritis   . Asthma   . Chickenpox   . Colon polyps 2010  . Heart murmur   . History of fainting spells of unknown cause   . Hyperlipidemia   . Hypertension   . Sarcoid 06/16/1976    Past Surgical History:  Procedure Laterality Date  . ABDOMINAL HYSTERECTOMY  1987  . LUNG BIOPSY  06/16/1976    Family History  Problem Relation Age of Onset  . Arthritis Mother   . Cancer Mother   . Depression Mother   . Heart attack Mother   . Hyperlipidemia Mother   . Hypertension Mother   . Alcohol abuse Father   . Heart disease Father   . Hypertension Father   . Hyperlipidemia Father   . Heart disease Sister   . Stroke Sister   . Alcohol abuse Brother   . Heart disease Brother   . Hypertension Brother   . Hyperlipidemia Brother   . Multiple sclerosis Daughter     . Hyperlipidemia Son   . Heart disease Maternal Grandfather   . Heart disease Paternal Grandfather   . Hyperlipidemia Sister   . Alcohol abuse Sister   . Hyperlipidemia Sister   . Hypertension Sister   . Alcohol abuse Brother   . Early death Brother   . Heart attack Brother   . Heart disease Brother   . Hyperlipidemia Brother     Social History   Socioeconomic History  . Marital status: Married    Spouse name: Not on file  . Number of children: Not on file  . Years of education: Not on file  . Highest education level: Not on file  Occupational History  . Not on file  Tobacco Use  . Smoking status: Never Smoker  . Smokeless tobacco: Never Used  Vaping Use  . Vaping Use: Never used  Substance and Sexual Activity  . Alcohol use: Yes    Comment: once in a while  . Drug use: Never  . Sexual activity: Not on file  Other Topics Concern  . Not on file  Social History Narrative  . Not on file   Social Determinants of Health   Financial Resource Strain:   . Difficulty of Paying Living Expenses: Not on file  Food Insecurity:   .  Worried About Charity fundraiser in the Last Year: Not on file  . Ran Out of Food in the Last Year: Not on file  Transportation Needs:   . Lack of Transportation (Medical): Not on file  . Lack of Transportation (Non-Medical): Not on file  Physical Activity:   . Days of Exercise per Week: Not on file  . Minutes of Exercise per Session: Not on file  Stress:   . Feeling of Stress : Not on file  Social Connections:   . Frequency of Communication with Friends and Family: Not on file  . Frequency of Social Gatherings with Friends and Family: Not on file  . Attends Religious Services: Not on file  . Active Member of Clubs or Organizations: Not on file  . Attends Archivist Meetings: Not on file  . Marital Status: Not on file  Intimate Partner Violence:   . Fear of Current or Ex-Partner: Not on file  . Emotionally Abused: Not on file   . Physically Abused: Not on file  . Sexually Abused: Not on file    Outpatient Medications Prior to Visit  Medication Sig Dispense Refill  . albuterol (PROVENTIL) (2.5 MG/3ML) 0.083% nebulizer solution Take 3 mLs (2.5 mg total) by nebulization every 6 (six) hours. 150 mL 2  . amLODipine (NORVASC) 5 MG tablet Take 1 tablet (5 mg total) by mouth daily. 180 tablet 3  . CALCIUM PO Take 1 tablet by mouth daily.     Marland Kitchen docusate sodium (STOOL SOFTENER LAXATIVE) 100 MG capsule Take 100 mg by mouth daily.     . famotidine (PEPCID) 10 MG tablet Take 10 mg by mouth daily. PRN     . Ginger, Zingiber officinalis, (GINGER PO) Take by mouth.    . Glycopyrrolate-Formoterol (BEVESPI AEROSPHERE) 9-4.8 MCG/ACT AERO Inhale 2 puffs into the lungs in the morning and at bedtime. 5.9 g 3  . lisinopril (ZESTRIL) 40 MG tablet Take 1 tablet (40 mg total) by mouth daily. 90 tablet 1  . Multiple Vitamins-Minerals (MULTIVITAL PO) Take 1 tablet by mouth daily.     . Omega-3 Fatty Acids (FISH OIL) 1000 MG CAPS Take by mouth.    . potassium chloride SA (KLOR-CON) 20 MEQ tablet Take 1 tablet (20 mEq total) by mouth 3 (three) times a week. 12 tablet 2  . Probiotic Product (PROBIOTIC DAILY PO) Take 1 tablet by mouth daily.     . simvastatin (ZOCOR) 20 MG tablet Take 1 tablet (20 mg total) by mouth daily. 90 tablet 3  . Turmeric (QC TUMERIC COMPLEX) 500 MG CAPS Take by mouth.    . Vaginal Lubricant (REPLENS VA) Place vaginally. cream    . fluticasone-salmeterol (ADVAIR HFA) 115-21 MCG/ACT inhaler Inhale 2 puffs into the lungs 2 (two) times daily. (Patient not taking: Reported on 10/05/2019) 3 Inhaler 1  . Fluticasone-Umeclidin-Vilant (TRELEGY ELLIPTA) 200-62.5-25 MCG/INH AEPB Inhale 1 puff into the lungs daily. (Patient not taking: Reported on 11/08/2019) 28 each 0  . predniSONE (STERAPRED UNI-PAK 21 TAB) 10 MG (21) TBPK tablet As directed (Patient not taking: Reported on 11/08/2019) 21 tablet 0  . umeclidinium bromide (INCRUSE  ELLIPTA) 62.5 MCG/INH AEPB Inhale 1 puff into the lungs daily. (Patient not taking: Reported on 08/12/2019) 30 each 5   No facility-administered medications prior to visit.    Allergies  Allergen Reactions  . Penicillins Itching  . Sulfa Antibiotics Itching  . Keflex [Cephalexin] Rash    ROS Review of Systems  Constitutional: Negative for  chills, diaphoresis, fatigue, fever and unexpected weight change.  HENT: Positive for postnasal drip and sneezing. Negative for rhinorrhea, sinus pressure, sinus pain and voice change.   Eyes: Negative for photophobia and visual disturbance.  Respiratory: Positive for cough and wheezing. Negative for chest tightness and shortness of breath.   Cardiovascular: Negative.   Gastrointestinal: Positive for nausea. Negative for abdominal pain and vomiting.  Endocrine: Negative for polyphagia and polyuria.  Genitourinary: Negative.   Skin: Negative for pallor and rash.  Allergic/Immunologic: Negative for immunocompromised state.  Neurological: Negative.   Psychiatric/Behavioral: Negative.       Objective:    Physical Exam Vitals and nursing note reviewed.  Constitutional:      General: She is not in acute distress.    Appearance: Normal appearance. She is not ill-appearing, toxic-appearing or diaphoretic.  HENT:     Head: Normocephalic and atraumatic.     Right Ear: External ear normal.     Left Ear: External ear normal.  Eyes:     General: No scleral icterus.       Right eye: No discharge.        Left eye: No discharge.     Conjunctiva/sclera: Conjunctivae normal.  Pulmonary:     Effort: Pulmonary effort is normal.  Neurological:     Mental Status: She is alert and oriented to person, place, and time.  Psychiatric:        Mood and Affect: Mood normal.        Behavior: Behavior normal.     Temp (!) 97.4 F (36.3 C) (Tympanic) Comment: per pt  Ht 5\' 2"  (1.575 m)   Wt 140 lb (63.5 kg) Comment: per pt  BMI 25.61 kg/m  Wt Readings  from Last 3 Encounters:  11/08/19 140 lb (63.5 kg)  10/05/19 141 lb (64 kg)  08/12/19 147 lb 9.6 oz (67 kg)     Health Maintenance Due  Topic Date Due  . Hepatitis C Screening  Never done  . INFLUENZA VACCINE  10/17/2019    There are no preventive care reminders to display for this patient.  Lab Results  Component Value Date   TSH 1.87 04/21/2019   Lab Results  Component Value Date   WBC 9.0 04/21/2019   HGB 13.5 04/21/2019   HCT 40.1 04/21/2019   MCV 90.2 04/21/2019   PLT 303.0 04/21/2019   Lab Results  Component Value Date   NA 137 05/05/2019   K 4.5 05/05/2019   CO2 24 05/05/2019   GLUCOSE 115 (H) 05/05/2019   BUN 22 05/05/2019   CREATININE 1.11 (H) 05/05/2019   BILITOT 0.5 09/08/2018   ALKPHOS 78 09/08/2018   AST 19 09/08/2018   ALT 21 09/08/2018   PROT 6.6 09/08/2018   ALBUMIN 4.3 09/08/2018   CALCIUM 10.3 05/05/2019   GFR 50.16 (L) 04/21/2019   Lab Results  Component Value Date   CHOL 191 09/08/2018   Lab Results  Component Value Date   HDL 53.30 09/08/2018   Lab Results  Component Value Date   LDLCALC 110 (H) 09/08/2018   Lab Results  Component Value Date   TRIG 135.0 09/08/2018   Lab Results  Component Value Date   CHOLHDL 4 09/08/2018   No results found for: HGBA1C    Assessment & Plan:   Problem List Items Addressed This Visit      Other   Cough   Relevant Medications   mometasone (NASONEX) 50 MCG/ACT nasal spray   predniSONE (DELTASONE) 20  MG tablet   Post-nasal drip - Primary   Relevant Medications   mometasone (NASONEX) 50 MCG/ACT nasal spray   predniSONE (DELTASONE) 20 MG tablet      Meds ordered this encounter  Medications  . mometasone (NASONEX) 50 MCG/ACT nasal spray    Sig: Place 2 sprays into the nose daily.    Dispense:  1 each    Refill:  3  . predniSONE (DELTASONE) 20 MG tablet    Sig: Take 1 tablet (20 mg total) by mouth 2 (two) times daily with a meal for 7 days.    Dispense:  14 tablet    Refill:  0      Follow-up: No follow-ups on file.   Schedule follow-up appointment with Baldo Ash in 1 month. Libby Maw, MD   Virtual Visit via Video Note  I connected with Kristen Suarez on 11/08/19 at 11:30 AM EDT by a video enabled telemedicine application and verified that I am speaking with the correct person using two identifiers.  Location: Patient: home with husband.  Provider:    I discussed the limitations of evaluation and management by telemedicine and the availability of in person appointments. The patient expressed understanding and agreed to proceed.  History of Present Illness:    Observations/Objective:   Assessment and Plan:   Follow Up Instructions:    I discussed the assessment and treatment plan with the patient. The patient was provided an opportunity to ask questions and all were answered. The patient agreed with the plan and demonstrated an understanding of the instructions.   The patient was advised to call back or seek an in-person evaluation if the symptoms worsen or if the condition fails to improve as anticipated.  I provided 22 minutes of non-face-to-face time during this encounter.   Libby Maw, MD

## 2019-11-18 ENCOUNTER — Encounter: Payer: Self-pay | Admitting: Rheumatology

## 2019-11-18 ENCOUNTER — Encounter: Payer: Self-pay | Admitting: Nurse Practitioner

## 2019-11-18 DIAGNOSIS — Z780 Asymptomatic menopausal state: Secondary | ICD-10-CM

## 2019-11-24 ENCOUNTER — Encounter: Payer: Self-pay | Admitting: Nurse Practitioner

## 2019-11-28 ENCOUNTER — Ambulatory Visit: Payer: Medicare Other

## 2019-11-29 LAB — CUP PACEART REMOTE DEVICE CHECK
Date Time Interrogation Session: 20210911232717
Implantable Pulse Generator Implant Date: 20190109

## 2019-12-08 ENCOUNTER — Ambulatory Visit (INDEPENDENT_AMBULATORY_CARE_PROVIDER_SITE_OTHER): Payer: Medicare Other | Admitting: Emergency Medicine

## 2019-12-08 ENCOUNTER — Encounter: Payer: Self-pay | Admitting: Emergency Medicine

## 2019-12-08 ENCOUNTER — Telehealth: Payer: Self-pay

## 2019-12-08 ENCOUNTER — Other Ambulatory Visit: Payer: Self-pay

## 2019-12-08 DIAGNOSIS — D869 Sarcoidosis, unspecified: Secondary | ICD-10-CM | POA: Diagnosis not present

## 2019-12-08 DIAGNOSIS — J309 Allergic rhinitis, unspecified: Secondary | ICD-10-CM | POA: Insufficient documentation

## 2019-12-08 DIAGNOSIS — J42 Unspecified chronic bronchitis: Secondary | ICD-10-CM

## 2019-12-08 DIAGNOSIS — J301 Allergic rhinitis due to pollen: Secondary | ICD-10-CM | POA: Diagnosis not present

## 2019-12-08 NOTE — Assessment & Plan Note (Signed)
Benefiting from [employer]

## 2019-12-08 NOTE — Telephone Encounter (Signed)
ILR implanted for syncope, alert received 12/08/19 for 1 tachy episode detected on 12/06/19 at 6:32 am. Appears to be a brief SVT (162 bpm for 7 sec). Currenty not taking any BB or rhythm control medications.   Called patient to assess s/s.   No answer, LMOVM.

## 2019-12-08 NOTE — Telephone Encounter (Signed)
Noted No changes Thanks SK

## 2019-12-08 NOTE — Assessment & Plan Note (Signed)
Continue your Zyrtec and Mucinex as you have been using them

## 2019-12-08 NOTE — Patient Instructions (Addendum)
We will perform a CT scan of your chest without contrast to follow your sarcoidosis Continue your Bevespi 2 puffs twice a day.  Rinse and gargle after using. Keep your albuterol available to use either 2 puffs or 1 nebulizer treatment up to every 4 hours if needed for shortness of breath, chest tightness, wheezing Continue your Zyrtec and Mucinex as you have been using them COVID-19, flu and pneumonia shots are up-to-date Follow with Dr Lamonte Sakai in 6 months or sooner if you have any problems

## 2019-12-08 NOTE — Progress Notes (Signed)
   Subjective:    Patient ID: Kristen Suarez, female    DOB: [birth year], 74 y.o.   MRN: [id number]  HPI  ROV 12/08/19 --74 year old woman with a history of sarcoidosis and associated obstructive lung disease, severe obstruction by pulmonary function testing with some coexisting restriction by volumes.  She has been managed on Spiriva and Advair.  Her most recent CT chest was done 01/01/2019 showed some biapical scar bilateral mid lung fibrotic change with traction bronchiectasis.  Since last time we tried changing her Spiriva and Advair to Bevespi to see if she would get benefit.  She reports that she likes the [facility], helps her breathing and does not irritate her UA. She has had some increased cough this Summer with allergies. Was treated with pred, now on zyrtec and mucinex. Uses albuterol nebs in the am, rarely at other times.    Review of Systems  Constitutional: Positive for unexpected weight change. Negative for fever.  HENT: Negative for congestion, dental problem, ear pain, nosebleeds, postnasal drip, rhinorrhea, sinus pressure, sneezing, sore throat and trouble swallowing.   Eyes: Negative for redness and itching.  Respiratory: Positive for cough and shortness of breath. Negative for chest tightness and wheezing.   Cardiovascular: Negative for palpitations and leg swelling.  Gastrointestinal: Negative for nausea and vomiting.  Genitourinary: Negative for dysuria.  Musculoskeletal: Negative for joint swelling.  Skin: Negative for rash.  Neurological: Negative for headaches.  Hematological: Does not bruise/bleed easily.  Psychiatric/Behavioral: Negative for dysphoric mood. The patient is nervous/anxious.        Objective:   Physical Exam Vitals:   12/08/19 1553  BP: 132/74  Pulse: 83  Temp: (!) 97.1 F (36.2 C)  TempSrc: Temporal  SpO2: 98%  Weight: 145 lb 12.8 oz (66.1 kg)  Height: 5\' 2"  (1.575 m)   Gen: Pleasant, well-nourished, in no distress,  normal affect  ENT: No  lesions,  mouth clear,  oropharynx clear, no postnasal drip  Neck: No JVD, no stridor  Lungs: No use of accessory muscles, some mild expiratory squeaks, right upper lobe inspiratory crackles, no overt wheezing  Cardiovascular: RRR, heart sounds normal, no murmur or gallops, no peripheral edema  Musculoskeletal: No deformities, no cyanosis or clubbing  Neuro: alert, awake, non focal  Skin: Warm, no lesions or rash      Assessment & Plan:  Sarcoidosis We will perform a CT scan of your chest without contrast to follow your sarcoidosis Continue your Bevespi 2 puffs twice a day.  Rinse and gargle after using. Keep your albuterol available to use either 2 puffs or 1 nebulizer treatment up to every 4 hours if needed for shortness of breath, chest tightness, wheezing COVID-19, flu and pneumonia shots are up-to-date Follow with Dr Lamonte Sakai in 6 months or sooner if you have any problems  Allergic rhinitis Continue your Zyrtec and Mucinex as you have been using them  COPD (chronic obstructive pulmonary disease) ([place]) Benefiting from Linton Ham, MD, PhD 12/08/2019, 4:22 PM [facility] [phone] or if no answer [phone]

## 2019-12-08 NOTE — Telephone Encounter (Signed)
Patient states she does not remember any episodes. States she has felt well and has no complaints. Patient advised I ill forward this to Dr. Caryl Comes for review. Advised her if she does not hear a call back there are no changes. Patient agreeable to plan.

## 2019-12-08 NOTE — Assessment & Plan Note (Signed)
We will perform a CT scan of your chest without contrast to follow your sarcoidosis Continue your Bevespi 2 puffs twice a day.  Rinse and gargle after using. Keep your albuterol available to use either 2 puffs or 1 nebulizer treatment up to every 4 hours if needed for shortness of breath, chest tightness, wheezing COVID-19, flu and pneumonia shots are up-to-date Follow with Dr Lamonte Sakai in 6 months or sooner if you have any problems

## 2019-12-09 ENCOUNTER — Encounter: Payer: Self-pay | Admitting: Nurse Practitioner

## 2019-12-13 DIAGNOSIS — Z9889 Other specified postprocedural states: Secondary | ICD-10-CM | POA: Insufficient documentation

## 2019-12-13 NOTE — Progress Notes (Signed)
      Patient Care Team: Nche, [name], NP as PCP - General (Internal Medicine) Deboraha Sprang, MD as PCP - Electrophysiology (Cardiology) Feliz Beam, MD as Referring Physician (Internal Medicine) Collene Gobble, MD as Consulting Physician (Pulmonary Disease)   HPI  Kristen Suarez is a 74 y.o. 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   continues with dyspnea, esp w exertion.  Has allergies and some RAD  Managed by Dr Lamonte Sakai Needing her neb sometimes 2x day;    Some edema  No chest pain  No LH  But has been proactive with heat avoidance   Records and Results Reviewed   Past Medical History:  Diagnosis Date  . Arthritis   . Asthma   . Chickenpox   . Colon polyps 2010  . Heart murmur   . History of fainting spells of unknown cause   . Hyperlipidemia   . Hypertension   . Sarcoid 06/16/1976    Past Surgical History:  Procedure Laterality Date  . ABDOMINAL HYSTERECTOMY  1987  . LUNG BIOPSY  06/16/1976    No outpatient medications have been marked as taking for the 12/14/19 encounter (Office Visit) with Deboraha Sprang, MD.    Allergies  Allergen Reactions  . Penicillins Itching  . Sulfa Antibiotics Itching  . Keflex [Cephalexin] Rash      Review of Systems negative except from HPI and PMH  Physical Exam BP (!) 154/78   Pulse 96   Wt 145 lb 12.8 oz (66.1 kg)   BMI 26.67 kg/m  .Well developed and well nourished in no acute distress HENT normal Neck supple with JVP-flat Wheezing with and w/o forced expiration Device pocket well healed; without hematoma or erythema.  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

## 2019-12-14 ENCOUNTER — Other Ambulatory Visit: Payer: Self-pay

## 2019-12-14 ENCOUNTER — Ambulatory Visit (INDEPENDENT_AMBULATORY_CARE_PROVIDER_SITE_OTHER): Payer: Medicare Other | Admitting: Internal Medicine

## 2019-12-14 ENCOUNTER — Other Ambulatory Visit: Payer: Self-pay | Admitting: Internal Medicine

## 2019-12-14 ENCOUNTER — Telehealth: Payer: Self-pay | Admitting: Emergency Medicine

## 2019-12-14 VITALS — BP 154/78 | HR 96 | Wt 145.8 lb

## 2019-12-14 DIAGNOSIS — Z9889 Other specified postprocedural states: Secondary | ICD-10-CM | POA: Diagnosis not present

## 2019-12-14 DIAGNOSIS — R55 Syncope and collapse: Secondary | ICD-10-CM

## 2019-12-14 LAB — CUP PACEART INCLINIC DEVICE CHECK
Date Time Interrogation Session: 20210928133041
Implantable Pulse Generator Implant Date: 20190109

## 2019-12-14 MED ORDER — FUROSEMIDE 20 MG PO TABS: ORAL_TABLET | ORAL | 0 refills | Status: DC

## 2019-12-14 NOTE — Telephone Encounter (Signed)
Spoke with Kristen Suarez who stated that she did have increased wheezing and increased SOB when in Dr. Olin Pia office today. Kristen Suarez states both wheezing and SOB have improved slightly since leaving Dr. Olin Pia office without any treatment. Kristen Suarez does state she is using Albuterol nebulizer every morning. Is using Bevespi BID.  Dr. Lamonte Sakai please advise. Thank you

## 2019-12-14 NOTE — Telephone Encounter (Signed)
Received a chart message from Dr Caryl Comes that patient has been wheezing, SOB. Has increased BD usage.   We will call her to check on her sx.

## 2019-12-14 NOTE — Telephone Encounter (Signed)
If she continues to have flaring sx after taking the lasix ordered today by Dr Caryl Comes then we will need to consider treating her for AE-COPD. Please have her call us by Friday so we can decide before the weekend.

## 2019-12-14 NOTE — Telephone Encounter (Signed)
Spoke with pt, aware of recs. Will route back to RB's nurse for follow-up later this week.

## 2019-12-14 NOTE — Patient Instructions (Signed)
Medication Instructions:   Your physician has recommended you make the following change in your medication:   Begin taking Furosemide (lasix) 1 tablet daily for 5 days  *If you need a refill on your cardiac medications before your next appointment, please call your pharmacy*   Lab Work: None ordered.  If you have labs (blood work) drawn today and your tests are completely normal, you will receive your results only by: Marland Kitchen MyChart Message (if you have MyChart) OR . A paper copy in the mail If you have any lab test that is abnormal or we need to change your treatment, we will call you to review the results.   Testing/Procedures: None ordered.    Follow-Up: At [facility], you and your health needs are our priority.  As part of our continuing mission to provide you with exceptional heart care, we have created designated Provider Care Teams.  These Care Teams include your primary Cardiologist (physician) and Advanced Practice Providers (APPs -  Physician Assistants and Nurse Practitioners) who all work together to provide you with the care you need, when you need it.  We recommend signing up for the patient portal called "MyChart".  Sign up information is provided on this After Visit Summary.  MyChart is used to connect with patients for Virtual Visits (Telemedicine).  Patients are able to view lab/test results, encounter notes, upcoming appointments, etc.  Non-urgent messages can be sent to your provider as well.   To learn more about what you can do with MyChart, go to [email or web address].    Your next appointment:   12 month(s)  The format for your next appointment:   In Person  Provider:   Virl Axe, MD

## 2019-12-16 NOTE — Telephone Encounter (Signed)
She still has sx. We need to see her in the office today or tomorrow -- either RB or APP

## 2019-12-16 NOTE — Telephone Encounter (Signed)
Spoke with pt who states dry cough/ SOB/ wheezing have increased since Tuesday. Pt stated she had to use Albuterol nebulizer X 2 yesterday and Albuterol inhaler 1 hour after using nebulizer. Pt confirms taking Lasix that Dr. Jens Som had ordered which resulted in 2lb weight loss.    Dr. Lamonte Sakai please advise. Thank you

## 2019-12-16 NOTE — Telephone Encounter (Signed)
Called pt and scheduled her for appointment on 10/1 at 11:30 with RB. Nothing further needed at this time.

## 2019-12-17 ENCOUNTER — Other Ambulatory Visit: Payer: Self-pay

## 2019-12-17 ENCOUNTER — Ambulatory Visit (INDEPENDENT_AMBULATORY_CARE_PROVIDER_SITE_OTHER): Payer: Medicare Other | Admitting: Emergency Medicine

## 2019-12-17 ENCOUNTER — Encounter: Payer: Self-pay | Admitting: Emergency Medicine

## 2019-12-17 DIAGNOSIS — R059 Cough, unspecified: Secondary | ICD-10-CM

## 2019-12-17 DIAGNOSIS — J42 Unspecified chronic bronchitis: Secondary | ICD-10-CM

## 2019-12-17 DIAGNOSIS — J301 Allergic rhinitis due to pollen: Secondary | ICD-10-CM | POA: Diagnosis not present

## 2019-12-17 DIAGNOSIS — K219 Gastro-esophageal reflux disease without esophagitis: Secondary | ICD-10-CM | POA: Diagnosis not present

## 2019-12-17 DIAGNOSIS — D869 Sarcoidosis, unspecified: Secondary | ICD-10-CM

## 2019-12-17 MED ORDER — IRBESARTAN 150 MG PO TABS: 150.0000 mg | ORAL_TABLET | Freq: Every day | ORAL | 3 refills | Status: DC

## 2019-12-17 MED ORDER — PANTOPRAZOLE SODIUM 40 MG PO TBEC: 40.0000 mg | DELAYED_RELEASE_TABLET | Freq: Every day | ORAL | 3 refills | Status: DC

## 2019-12-17 NOTE — Progress Notes (Signed)
Subjective:    Patient ID: Kristen Suarez, female    DOB: [birth year], 74 y.o.   MRN: [id number]  HPI  ROV 12/08/19 --74 year old woman with a history of sarcoidosis and associated obstructive lung disease, severe obstruction by pulmonary function testing with some coexisting restriction by volumes.  She has been managed on Spiriva and Advair.  Her most recent CT chest was done 01/01/2019 showed some biapical scar bilateral mid lung fibrotic change with traction bronchiectasis.  Since last time we tried changing her Spiriva and Advair to Bevespi to see if she would get benefit.  She reports that she likes the [facility], helps her breathing and does not irritate her UA. She has had some increased cough this Summer with allergies. Was treated with pred, now on zyrtec and mucinex. Uses albuterol nebs in the am, rarely at other times.   Acute OV 12/17/19 --acute visit for 74 year old woman with a history of sarcoidosis and associated COPD, severe obstruction based on her pulmonary function testing with some associated restriction.  She was seen just over a week ago, was stable on Bevespi.  She was dealing with some allergic rhinitis and associated cough.  She was seen in Dr. Olin Pia office on 9/28 and her symptoms had increased.  She has been having increased cough, during the days and now at night waking her up. Same pattern as in July and August when she was treated with pred. Remains on zyrtec and nasonex at bedtime. She is on ginger, fish oil, lisinopril. No perceived reflux. She uses famotidine qhs. Her exercise tolerance may be a bit lower.   MDM: reviewed Dr Olin Pia notes from 12/14/19  Review of Systems  Constitutional: Positive for unexpected weight change. Negative for fever.  HENT: Negative for congestion, dental problem, ear pain, nosebleeds, postnasal drip, rhinorrhea, sinus pressure, sneezing, sore throat and trouble swallowing.   Eyes: Negative for redness and itching.  Respiratory: Positive  for cough and shortness of breath. Negative for chest tightness and wheezing.   Cardiovascular: Negative for palpitations and leg swelling.  Gastrointestinal: Negative for nausea and vomiting.  Genitourinary: Negative for dysuria.  Musculoskeletal: Negative for joint swelling.  Skin: Negative for rash.  Neurological: Negative for headaches.  Hematological: Does not bruise/bleed easily.  Psychiatric/Behavioral: Negative for dysphoric mood. The patient is nervous/anxious.        Objective:   Physical Exam Vitals:   12/17/19 1126  BP: (!) 142/78  Pulse: 80  Temp: 98.1 F (36.7 C)  TempSrc: Oral  SpO2: 97%  Weight: 144 lb 3.2 oz (65.4 kg)  Height: 5\' 3"  (1.6 m)   Gen: Pleasant, well-nourished, in no distress,  normal affect  ENT: No lesions,  mouth clear,  oropharynx clear, no postnasal drip  Neck: No JVD, some coarse UA noise without overt stridor  Lungs: No use of accessory muscles, some mild expiratory squeaks, right upper lobe inspiratory crackles, no wheezing  Cardiovascular: RRR, heart sounds normal, no murmur or gallops, no peripheral edema  Musculoskeletal: No deformities, no cyanosis or clubbing  Neuro: alert, awake, non focal  Skin: Warm, no lesions or rash      Assessment & Plan:  COPD (chronic obstructive pulmonary disease) ([facility]) Continue your Bevespi 2 puffs twice a day. Keep albuterol available to use either 1 nebulizer treatment or 2 puffs if you need it for shortness of breath, chest tightness, wheezing, coughing.  Allergic rhinitis Please continue Zyrtec once daily Please continue your Nasonex nasal spray, 2 sprays each nostril every evening.  Do not take it right before laying down for bed  Cough Ramp-up therapy for allergic rhinitis, GERD Temporarily stop your ginger and fish oil Stop lisinopril We will start irbesartan 150 mg once daily as a substitute for lisinopril  GERD (gastroesophageal reflux disease) Continue your famotidine (Pepcid)  every evening Start pantoprazole 40 mg once daily.  Take this 1 hour around food.  Sarcoidosis Repeat CT scan of the chest planned.  We will follow-up to review after it has been performed  Baltazar Apo, MD, PhD 12/17/2019, 11:53 AM [facility] [phone] or if no answer [phone]

## 2019-12-17 NOTE — Assessment & Plan Note (Signed)
Continue your Bevespi 2 puffs twice a day. Keep albuterol available to use either 1 nebulizer treatment or 2 puffs if you need it for shortness of breath, chest tightness, wheezing, coughing.

## 2019-12-17 NOTE — Assessment & Plan Note (Signed)
Please continue Zyrtec once daily Please continue your Nasonex nasal spray, 2 sprays each nostril every evening.  Do not take it right before laying down for bed

## 2019-12-17 NOTE — Assessment & Plan Note (Signed)
Repeat CT scan of the chest planned.  We will follow-up to review after it has been performed

## 2019-12-17 NOTE — Assessment & Plan Note (Signed)
Ramp-up therapy for allergic rhinitis, GERD Temporarily stop your ginger and fish oil Stop lisinopril We will start irbesartan 150 mg once daily as a substitute for lisinopril

## 2019-12-17 NOTE — Addendum Note (Signed)
Addended by: Gavin Potters R on: 12/17/2019 12:20 PM   Modules accepted: Orders

## 2019-12-17 NOTE — Assessment & Plan Note (Signed)
Continue your famotidine (Pepcid) every evening Start pantoprazole 40 mg once daily.  Take this 1 hour around food.

## 2019-12-17 NOTE — Patient Instructions (Signed)
Please continue Zyrtec once daily Please continue your Nasonex nasal spray, 2 sprays each nostril every evening.  Do not take it right before laying down for bed Temporarily stop your ginger and fish oil Stop lisinopril We will start irbesartan 150 mg once daily as a substitute for lisinopril Continue your famotidine (Pepcid) every evening Start pantoprazole 40 mg once daily.  Take this 1 hour around food. Continue your Bevespi 2 puffs twice a day. Keep albuterol available to use either 1 nebulizer treatment or 2 puffs if you need it for shortness of breath, chest tightness, wheezing, coughing. Get your CT scan of the chest as planned Follow with Dr. Lamonte Sakai next available after your CT scan so that we can review the results and talk about your symptoms.

## 2019-12-21 ENCOUNTER — Telehealth: Payer: Self-pay | Admitting: Emergency Medicine

## 2019-12-21 MED ORDER — PREDNISONE 10 MG PO TABS: ORAL_TABLET | ORAL | 0 refills | Status: AC

## 2019-12-21 MED ORDER — AZITHROMYCIN 250 MG PO TABS: ORAL_TABLET | ORAL | 0 refills | Status: DC

## 2019-12-21 MED ORDER — HYDROCODONE-HOMATROPINE 5-1.5 MG/5ML PO SYRP: 5.0000 mL | ORAL_SOLUTION | Freq: Four times a day (QID) | ORAL | 0 refills | Status: DC | PRN

## 2019-12-21 NOTE — Telephone Encounter (Signed)
Please have her take prednisone >> Take 40mg  daily for 3 days, then 30mg  daily for 3 days, then 20mg  daily for 3 days, then 10mg  daily for 3 days, then stop  Order azithro >> z-pack  I ordered hycodan for her

## 2019-12-21 NOTE — Telephone Encounter (Signed)
Primary Pulmonologist: Dr. Lamonte Sakai Last office visit and with whom: Dr. Lamonte Sakai 12/17/19 What do we see them for (pulmonary problems): Sarcoidosis, Bronchitis Last OV assessment/plan: SEE BELOW  Was appointment offered to patient (explain)?  No   Reason for call: Called and spoke with patient who states her coughing is getting worse. Notices that it is at it's worst in morning and evening. She states she is now wearing adult diaper due to her having 2 accidents. He put herself on the BRAT diet due to not having much of an appetite. Yellow sputum, denies fevers but BP and HR up. OV on 12/17/19 with DR. Byrum  Dr. Lamonte Sakai please advise  (examples of things to ask: : When did symptoms start? Fever? Cough? Productive? Color to sputum? More sputum than usual? Wheezing? Have you needed increased oxygen? Are you taking your respiratory medications? What over the counter measures have you tried?)  Allergies  Allergen Reactions  . Penicillins Itching  . Sulfa Antibiotics Itching  . Keflex [Cephalexin] Rash    Immunization History  Administered Date(s) Administered  . Fluad Quad(high Dose 65+) 11/19/2018  . Influenza, High Dose Seasonal PF 12/11/2017  . Influenza-Unspecified 11/24/2019, 11/24/2019  . PFIZER SARS-COV-2 Vaccination 05/14/2019, 06/08/2019  . Pneumococcal Conjugate-13 03/23/2014  . Pneumococcal Polysaccharide-23 03/24/2012  . Tdap 03/24/2012    Instructions  Please continue Zyrtec once daily Please continue your Nasonex nasal spray, 2 sprays each nostril every evening.  Do not take it right before laying down for bed Temporarily stop your ginger and fish oil Stop lisinopril We will start irbesartan 150 mg once daily as a substitute for lisinopril Continue your famotidine (Pepcid) every evening Start pantoprazole 40 mg once daily.  Take this 1 hour around food. Continue your Bevespi 2 puffs twice a day. Keep albuterol available to use either 1 nebulizer treatment or 2 puffs if you  need it for shortness of breath, chest tightness, wheezing, coughing. Get your CT scan of the chest as planned Follow with Dr. Lamonte Sakai next available after your CT scan so that we can review the results and talk about your symptoms.

## 2019-12-21 NOTE — Telephone Encounter (Signed)
Called and spoke with patient to let her know that we are sending in 3 RX for her. She verified preferred pharmacy. RX sent in. Nothing further needed at this time.

## 2019-12-28 ENCOUNTER — Ambulatory Visit
Admission: RE | Admit: 2019-12-28 | Discharge: 2019-12-28 | Disposition: A | Discharge status: Home / Self Care | Payer: Medicare Other | Source: Ambulatory Visit | Attending: Emergency Medicine | Admitting: Emergency Medicine

## 2019-12-28 ENCOUNTER — Other Ambulatory Visit: Payer: Self-pay

## 2019-12-28 DIAGNOSIS — D869 Sarcoidosis, unspecified: Secondary | ICD-10-CM

## 2019-12-30 ENCOUNTER — Ambulatory Visit: Payer: Medicare Other | Admitting: Emergency Medicine

## 2019-12-30 ENCOUNTER — Other Ambulatory Visit: Payer: Self-pay

## 2019-12-31 ENCOUNTER — Ambulatory Visit (INDEPENDENT_AMBULATORY_CARE_PROVIDER_SITE_OTHER): Payer: Medicare Other

## 2019-12-31 DIAGNOSIS — R55 Syncope and collapse: Secondary | ICD-10-CM | POA: Diagnosis not present

## 2020-01-01 LAB — CUP PACEART REMOTE DEVICE CHECK
Date Time Interrogation Session: 20211014233526
Implantable Pulse Generator Implant Date: 20190109

## 2020-01-03 ENCOUNTER — Encounter: Payer: Self-pay | Admitting: Emergency Medicine

## 2020-01-03 ENCOUNTER — Ambulatory Visit (INDEPENDENT_AMBULATORY_CARE_PROVIDER_SITE_OTHER): Payer: Medicare Other | Admitting: Emergency Medicine

## 2020-01-03 ENCOUNTER — Other Ambulatory Visit: Payer: Self-pay

## 2020-01-03 VITALS — BP 130/76 | HR 64 | Temp 97.5°F | Ht 62.0 in | Wt 147.4 lb

## 2020-01-03 DIAGNOSIS — R059 Cough, unspecified: Secondary | ICD-10-CM | POA: Diagnosis not present

## 2020-01-03 DIAGNOSIS — J42 Unspecified chronic bronchitis: Secondary | ICD-10-CM | POA: Diagnosis not present

## 2020-01-03 DIAGNOSIS — D869 Sarcoidosis, unspecified: Secondary | ICD-10-CM | POA: Diagnosis not present

## 2020-01-03 MED ORDER — ALBUTEROL SULFATE HFA 108 (90 BASE) MCG/ACT IN AERS: 2.0000 | INHALATION_SPRAY | Freq: Four times a day (QID) | RESPIRATORY_TRACT | 6 refills | Status: DC | PRN

## 2020-01-03 MED ORDER — BEVESPI AEROSPHERE 9-4.8 MCG/ACT IN AERO: 2.0000 | INHALATION_SPRAY | Freq: Two times a day (BID) | RESPIRATORY_TRACT | 3 refills | Status: DC

## 2020-01-03 NOTE — Progress Notes (Signed)
Subjective:    Patient ID: Kristen Suarez, female    DOB: 1945/03/27, 74 y.o.   MRN: [id number]  HPI  ROV 12/08/19 --75 year old woman with a history of sarcoidosis and associated obstructive lung disease, severe obstruction by pulmonary function testing with some coexisting restriction by volumes.  She has been managed on Spiriva and Advair.  Her most recent CT chest was done 01/01/2019 showed some biapical scar bilateral mid lung fibrotic change with traction bronchiectasis.  Since last time we tried changing her Spiriva and Advair to Bevespi to see if she would get benefit.  She reports that she likes the [facility], helps her breathing and does not irritate her UA. She has had some increased cough this Summer with allergies. Was treated with pred, now on zyrtec and mucinex. Uses albuterol nebs in the am, rarely at other times.   Acute OV 12/17/19 --acute visit for 74 year old woman with a history of sarcoidosis and associated COPD, severe obstruction based on her pulmonary function testing with some associated restriction.  She was seen just over a week ago, was stable on Bevespi.  She was dealing with some allergic rhinitis and associated cough.  She was seen in Dr. Olin Pia office on 9/28 and her symptoms had increased.  She has been having increased cough, during the days and now at night waking her up. Same pattern as in July and August when she was treated with pred. Remains on zyrtec and nasonex at bedtime. She is on ginger, fish oil, lisinopril. No perceived reflux. She uses famotidine qhs. Her exercise tolerance may be a bit lower.   MDM: reviewed Dr Olin Pia notes from 12/14/19  ROV 01/03/20 --follow-up visit 74 year old woman who has sarcoidosis, associated COPD with severe obstruction and superimposed restriction.  She has chronic cough with allergic rhinitis, GERD.  Her cough is flaring at the beginning of the month and I asked her to start pantoprazole, continue famotidine, stop ginger and  fish oil, stop lisinopril and substitute irbesartan.  She called a few days later with increased symptoms, some bronchitic symptoms and I treated her with a prednisone taper and antibiotics, hycodan.  She reports today that her breathing and cough both improved. Still a small amount of dry cough. Remains Bevespi, uses albuterol about once a day. Her nebulizer machine just broke > needs a new one.  We repeated her CT scan of the chest on 12/28/2019 which I have reviewed, shows an overall stable appearance of bronchiectatic and fibrotic change compared with 1 year ago.  Also noted were some new clustered nodularity in the anterior left upper lobe consistent with inflammatory process.  Review of Systems  Constitutional:  Positive for unexpected weight change. Negative for fever.  HENT:  Negative for congestion, dental problem, ear pain, nosebleeds, postnasal drip, rhinorrhea, sinus pressure, sneezing, sore throat and trouble swallowing.   Eyes:  Negative for redness and itching.  Respiratory:  Positive for cough and shortness of breath. Negative for chest tightness and wheezing.   Cardiovascular:  Negative for palpitations and leg swelling.  Gastrointestinal:  Negative for nausea and vomiting.  Genitourinary:  Negative for dysuria.  Musculoskeletal:  Negative for joint swelling.  Skin:  Negative for rash.  Neurological:  Negative for headaches.  Hematological:  Does not bruise/bleed easily.  Psychiatric/Behavioral:  Negative for dysphoric mood. The patient is nervous/anxious.       Objective:   Physical Exam Vitals:   01/03/20 1143  BP: 130/76  Pulse: 64  Temp: (!) 97.5 F (  36.4 C)  TempSrc: Temporal  SpO2: 97%  Weight: 147 lb 6.4 oz (66.9 kg)  Height: 5\' 2"  (1.575 m)   Gen: Pleasant, well-nourished, in no distress,  normal affect  ENT: No lesions,  mouth clear,  oropharynx clear, no postnasal drip  Neck: No JVD, some coarse UA noise without overt stridor  Lungs: No use of accessory  muscles, some mild expiratory squeaks, right upper lobe inspiratory crackles, no wheezing  Cardiovascular: RRR, heart sounds normal, no murmur or gallops, no peripheral edema  Musculoskeletal: No deformities, no cyanosis or clubbing  Neuro: alert, awake, non focal  Skin: Warm, no lesions or rash      Assessment & Plan:  Cough  Continue your irbesartan as ordered. Start pantoprazole 40mg  (Protoinix) 40mg  daily if not already on this.  Continue famotidine as you are taking  Continue zyrtec and nasonex as you are using them  Follow with Dr. Lamonte Sakai in 2 months or sooner if you have any problems.   Sarcoidosis Your CT chest is stable overall. This is good news.  COPD (chronic obstructive pulmonary disease) (HCC) We will plan to continue Bevespi 2 puffs once a day. Rinse and gargle after using Keep albuterol available to use 2 puffs or 1 neb treatment up to every 4 hours if needed for shortness of breath, chest tightness, wheezing.  We will get you a new nebulizer machine.   Baltazar Apo, MD, PhD 12/10/2020, 10:49 AM [facility] [phone] or if no answer [phone]

## 2020-01-03 NOTE — Patient Instructions (Addendum)
We will plan to continue Bevespi 2 puffs once a day. Rinse and gargle after using Keep albuterol available to use 2 puffs or 1 neb treatment up to every 4 hours if needed for shortness of breath, chest tightness, wheezing.  We will get you a new nebulizer machine.  Your CT chest is stable overall. This is good news.  Continue your irbesartan as ordered. Start pantoprazole 40mg  (Protoinix) 40mg  daily if not already on this.  Continue famotidine as you are taking  Continue zyrtec and nasonex as you are using them  Follow with Dr. Lamonte Sakai in 2 months or sooner if you have any problems.

## 2020-01-05 NOTE — Progress Notes (Signed)
Carelink Summary Report / Loop Recorder 

## 2020-01-10 ENCOUNTER — Telehealth: Payer: Self-pay | Admitting: Emergency Medicine

## 2020-01-10 NOTE — Telephone Encounter (Signed)
Called and spoke with [place] with [employer] regarding the patient's nebulizer.  Kenney Houseman said they would get the order processed and get it out to her tomorrow.  I let her know I would make the patient aware.  Noting further needed.

## 2020-01-11 ENCOUNTER — Telehealth: Payer: Self-pay | Admitting: Nurse Practitioner

## 2020-01-11 NOTE — Telephone Encounter (Signed)
Left message for patient to schedule Annual Wellness Visit.  Please schedule with Nurse Health Advisor Martha Stanley, RN at [facility]  °

## 2020-01-12 ENCOUNTER — Other Ambulatory Visit: Payer: Self-pay | Admitting: Obstetrics and Gynecology

## 2020-01-12 DIAGNOSIS — Z1231 Encounter for screening mammogram for malignant neoplasm of breast: Secondary | ICD-10-CM

## 2020-01-19 NOTE — Progress Notes (Signed)
Office Visit Note  Patient: Kristen Suarez             Date of Birth: November 20, 1945           MRN: [id number]             PCP: Flossie Buffy, NP Referring: Flossie Buffy, NP Visit Date: 02/02/2020 Occupation: @GUAROCC @  Subjective:  Joint stiffness.   History of Present Illness: Kristen Suarez is a 74 y.o. female with history of sarcoidosis and osteoarthritis.  She states she has had several upper respiratory tract infections and allergies which has caused upper airway problems.  She has been followed by Dr. Lamonte Sakai.  Dr. Lamonte Sakai did CT scan of her chest in October 2021 which was consistent with bronchiectasis.  No recurrence of pulmonary sarcoidosis was noted.  She has been exercising on a regular basis and has been practicing yoga which is helped her joint pain and discomfort.  DEXA is scheduled for December 2021.  Activities of Daily Living:  Patient reports morning stiffness for a few minutes.   Patient Denies nocturnal pain.  Difficulty dressing/grooming: Denies Difficulty climbing stairs: Denies Difficulty getting out of chair: Denies Difficulty using hands for taps, buttons, cutlery, and/or writing: Reports  Review of Systems  Constitutional: Positive for fatigue.  HENT: Negative for mouth sores, mouth dryness and nose dryness.   Eyes: Positive for dryness. Negative for pain and itching.  Respiratory: Positive for shortness of breath and difficulty breathing.   Cardiovascular: Negative for chest pain and palpitations.  Gastrointestinal: Positive for constipation. Negative for blood in stool and diarrhea.  Endocrine: Negative for increased urination.  Genitourinary: Negative for difficulty urinating.  Musculoskeletal: Positive for myalgias, morning stiffness, muscle tenderness and myalgias. Negative for arthralgias, joint pain and joint swelling.  Skin: Negative for color change, rash and redness.  Allergic/Immunologic: Negative for susceptible to infections.    Neurological: Negative for dizziness, numbness, headaches, memory loss and weakness.  Hematological: Positive for bruising/bleeding tendency.  Psychiatric/Behavioral: Negative for confusion.    PMFS History:  Patient Active Problem List   Diagnosis Date Noted  . History of loop recorder 12/13/2019  . Allergic rhinitis 12/08/2019  . Cough 11/08/2019  . Post-nasal drip 11/08/2019  . Multiple joint pain 04/22/2019  . Bilateral leg edema 04/22/2019  . Vaginal atrophy 01/05/2019  . Resting tremor 09/08/2018  . Sarcoidosis 05/08/2018  . HTN (hypertension), benign 05/08/2018  . Colon polyp 05/08/2018  . Family hx of colon cancer 05/08/2018  . Cystocele, midline 05/08/2018  . Hyperlipidemia 05/08/2018  . Vasovagal syncope 05/08/2018  . Seborrheic keratosis 05/08/2018  . Pseudophakia of both eyes 05/08/2018  . GERD (gastroesophageal reflux disease) 05/08/2018  . COPD (chronic obstructive pulmonary disease) ([place]) 05/08/2018    Past Medical History:  Diagnosis Date  . Arthritis   . Asthma   . Chickenpox   . Colon polyps 2010  . Heart murmur   . History of fainting spells of unknown cause   . Hyperlipidemia   . Hypertension   . Sarcoid 06/16/1976    Family History  Problem Relation Age of Onset  . Arthritis Mother   . Cancer Mother   . Depression Mother   . Heart attack Mother   . Hyperlipidemia Mother   . Hypertension Mother   . Alcohol abuse Father   . Heart disease Father   . Hypertension Father   . Hyperlipidemia Father   . Heart disease Sister   . Stroke Sister   . Alcohol abuse  Brother   . Heart disease Brother   . Hypertension Brother   . Hyperlipidemia Brother   . Multiple sclerosis Daughter   . Hyperlipidemia Son   . Heart disease Maternal Grandfather   . Heart disease Paternal Grandfather   . Hyperlipidemia Sister   . Alcohol abuse Sister   . Hyperlipidemia Sister   . Hypertension Sister   . Alcohol abuse Brother   . Early death Brother   . Heart  attack Brother   . Heart disease Brother   . Hyperlipidemia Brother    Past Surgical History:  Procedure Laterality Date  . ABDOMINAL HYSTERECTOMY  1987  . LUNG BIOPSY  06/16/1976   Social History   Social History Narrative  . Not on file   Immunization History  Administered Date(s) Administered  . Fluad Quad(high Dose 65+) 11/19/2018  . Influenza, High Dose Seasonal PF 12/11/2017  . Influenza-Unspecified 11/24/2019, 11/24/2019  . PFIZER SARS-COV-2 Vaccination 05/14/2019, 06/08/2019, 12/20/2019  . Pneumococcal Conjugate-13 03/23/2014  . Pneumococcal Polysaccharide-23 03/24/2012  . Tdap 03/24/2012     Objective: Vital Signs: BP (!) 163/79 (BP Location: Left Arm, Patient Position: Sitting, Cuff Size: Normal)   Pulse 76   Resp 13   Ht 5' 2.75" (1.594 m)   Wt 147 lb 6.4 oz (66.9 kg)   BMI 26.32 kg/m    Physical Exam Vitals and nursing note reviewed.  Constitutional:      Appearance: She is well-developed.  HENT:     Head: Normocephalic and atraumatic.  Eyes:     Conjunctiva/sclera: Conjunctivae normal.  Cardiovascular:     Rate and Rhythm: Normal rate and regular rhythm.     Heart sounds: Normal heart sounds.  Pulmonary:     Effort: Pulmonary effort is normal.     Breath sounds: Normal breath sounds.  Abdominal:     General: Bowel sounds are normal.     Palpations: Abdomen is soft.  Musculoskeletal:     Cervical back: Normal range of motion.  Lymphadenopathy:     Cervical: No cervical adenopathy.  Skin:    General: Skin is warm and dry.     Capillary Refill: Capillary refill takes less than 2 seconds.  Neurological:     Mental Status: She is alert and oriented to person, place, and time.  Psychiatric:        Behavior: Behavior normal.      Musculoskeletal Exam: She had good range of motion of her C-spine.  Shoulder joints, elbow joints, wrist joints with good range of motion.  She has PIP and DIP thickening and incomplete fist formation due to severe  osteoarthritis.  Hip joints, knee joints with good range of motion.  She had no tenderness over ankles or MTPs or PIPs.  CDAI Exam: CDAI Score: -- Patient Global: --; Provider Global: -- Swollen: --; Tender: -- Joint Exam 02/02/2020   No joint exam has been documented for this visit   There is currently no information documented on the homunculus. Go to the Rheumatology activity and complete the homunculus joint exam.  Investigation: No additional findings.  Imaging: CUP PACEART REMOTE DEVICE CHECK  Result Date: 02/02/2020 ILR summary report received. Battery status OK. Normal device function. No new symptom, tachy, brady, or pause episodes. No new AF episodes. Monthly summary reports and ROV/PRN   Recent Labs: Lab Results  Component Value Date   WBC 9.0 04/21/2019   HGB 13.5 04/21/2019   PLT 303.0 04/21/2019   NA 137 05/05/2019   K 4.5  05/05/2019   CL 98 05/05/2019   CO2 24 05/05/2019   GLUCOSE 115 (H) 05/05/2019   BUN 22 05/05/2019   CREATININE 1.11 (H) 05/05/2019   BILITOT 0.5 09/08/2018   ALKPHOS 78 09/08/2018   AST 19 09/08/2018   ALT 21 09/08/2018   PROT 6.6 09/08/2018   ALBUMIN 4.3 09/08/2018   CALCIUM 10.3 05/05/2019   GFRAA 57 (L) 05/05/2019    Speciality Comments: No specialty comments available.   IMPRESSION: 1. Fibrotic changes and bronchiectatic changes with upper lobe predominance are unchanged compared to the study from October of 2020 and extend from the hilum into the peripheral upper and lower lobe. 2. LEFT upper lobe nodules, clustered at the periphery of the anterior LEFT chest. These are new compared to the prior study. Findings could be related to sarcoidosis or superimposed infection. Overall there is little change however compared to the prior study. 3. Calcified and partially calcified lymph nodes throughout the mediastinum without change. 4. Three-vessel coronary artery disease. 5. Aortic atherosclerosis.  Aortic Atherosclerosis  (ICD10-I70.0).  Electronically Signed   By: Zetta Bills M.D.   On: 12/28/2019 16:38  Procedures:  No procedures performed Allergies: Penicillins, Sulfa antibiotics, and Keflex [cephalexin]   Assessment / Plan:     Visit Diagnoses: Sarcoidosis - patient was diagnosed with sarcoidosis at age 45 and was treated with prednisone for 15 years.She states she has been followed by Dr. Lamonte Sakai.  She had recent CT scan which I reviewed with her head did not show any exacerbation of sarcoidosis.  She has been having frequent upper respiratory tract infections.  Primary osteoarthritis of both hands-she has severe osteoarthritis in her hands.  Joint protection muscle strengthening was discussed.  Primary osteoarthritis of both feet-proper fitting shoes were discussed.  Postural kyphosis of thoracic region - advised her to get a DEXA through her PCP.  We will discuss DEXA results at the follow-up visit.  HTN (hypertension), benign-her systolic blood pressure is elevated.  Have advised her to monitor blood pressure closely.  History of gastroesophageal reflux (GERD)  Chronic bronchitis, unspecified chronic bronchitis type (HCC)  History of hyperlipidemia  Hx of colonic polyps  Seborrheic keratosis  Resting tremor  Pseudophakia of both eyes  Family history of multiple sclerosis  Orders: No orders of the defined types were placed in this encounter.  No orders of the defined types were placed in this encounter.    Follow-Up Instructions: Return in about 6 months (around 08/01/2020) for Sarcoidosis, OA.   Bo Merino, MD  Note - This record has been created using Editor, commissioning.  Chart creation errors have been sought, but may not always  have been located. Such creation errors do not reflect on  the standard of medical care.

## 2020-01-26 ENCOUNTER — Telehealth: Payer: Self-pay | Admitting: Emergency Medicine

## 2020-01-26 MED ORDER — PREDNISONE 10 MG PO TABS: ORAL_TABLET | ORAL | 0 refills | Status: DC

## 2020-01-26 NOTE — Telephone Encounter (Signed)
Spoke with the pt and notified of response per RB  She verbalized understanding  I have sent rx for pred and scheduled her for visit with Beth at 10:30 tomorrow

## 2020-01-26 NOTE — Telephone Encounter (Signed)
Will treat her with pred as below  She will need to be seen - persistent and flaring sx for the last few months, multiple pred tapers. Please make her an OV with RB or APP.

## 2020-01-26 NOTE — Telephone Encounter (Signed)
Spoke with the pt  She is c/o increased SOB, wheezing and cough- non prod x 2 wks She states she is winded just walking to the bathroom and back  She denies any f/c/s, body aches  She states still taking all meds as directed on last AVS:  We will plan to continue Bevespi 2 puffs once a day. Rinse and gargle after using Keep albuterol available to use 2 puffs or 1 neb treatment up to every 4 hours if needed for shortness of breath, chest tightness, wheezing.  We will get you a new nebulizer machine.  Your CT chest is stable overall. This is good news.  Continue your irbesartan as ordered. Start pantoprazole 40mg  (Protoinix) 40mg  daily if not already on this.  Continue famotidine as you are taking  Continue zyrtec and nasonex as you are using them  Follow with Dr. Lamonte Sakai in 2 months or sooner if you have any problems.    Pt fully vaccinated against covid including booster  Please advise, thanks!

## 2020-01-26 NOTE — Progress Notes (Signed)
@Patient  ID: Kristen Suarez, female    DOB: January 13, 1946, 74 y.o.   MRN: [id number]  Chief Complaint  Patient presents with  . Acute Visit    SOB/ cough increased x 01/24/20    Referring provider: Flossie Buffy, NP  HPI: 74 year old female, never smoked. PMH significant for severe COPD. Patient of Dr. Lamonte Sakai, last seen in October 2021. Maintained on Bevespi. Last PFTs in October 2020 showed moderate-severe airway obstruction/ FEV1 1.03 (46%), RATIO 69.  01/27/2020 Patient presents today for acute visit with increased shortness of breath over the last 2-3 months. She originally had allergies symptoms/congestion in July- August. Since September she reports increased shortness of breath with associated wheezing. She has noticed a change in her level of functioning, states that she is unable to walk as far as she once could. She has to space out her house work. She has had multiple exacerbations over the last couple of months treated with prednisone. She is currently on a prednisone taper and typically improves with steriods. She is on bevespi as maintenance inhaler. She denies productive cough.    Allergies  Allergen Reactions  . Penicillins Itching  . Sulfa Antibiotics Itching  . Keflex [Cephalexin] Rash    Immunization History  Administered Date(s) Administered  . Fluad Quad(high Dose 65+) 11/19/2018  . Influenza, High Dose Seasonal PF 12/11/2017  . Influenza-Unspecified 11/24/2019, 11/24/2019  . PFIZER SARS-COV-2 Vaccination 05/14/2019, 06/08/2019, 12/20/2019  . Pneumococcal Conjugate-13 03/23/2014  . Pneumococcal Polysaccharide-23 03/24/2012  . Tdap 03/24/2012    Past Medical History:  Diagnosis Date  . Arthritis   . Asthma   . Chickenpox   . Colon polyps 2010  . Heart murmur   . History of fainting spells of unknown cause   . Hyperlipidemia   . Hypertension   . Sarcoid 06/16/1976    Tobacco History: Social History   Tobacco Use  Smoking Status Never Smoker    Smokeless Tobacco Never Used   Counseling given: Not Answered   Outpatient Medications Prior to Visit  Medication Sig Dispense Refill  . albuterol (PROVENTIL) (2.5 MG/3ML) 0.083% nebulizer solution Take 3 mLs (2.5 mg total) by nebulization every 6 (six) hours. 150 mL 2  . albuterol (VENTOLIN HFA) 108 (90 Base) MCG/ACT inhaler Inhale 2 puffs into the lungs every 6 (six) hours as needed for wheezing or shortness of breath. 8 g 6  . amLODipine (NORVASC) 5 MG tablet Take 1 tablet (5 mg total) by mouth daily. 180 tablet 3  . CALCIUM PO Take 1 tablet by mouth daily.     Marland Kitchen docusate sodium (STOOL SOFTENER LAXATIVE) 100 MG capsule Take 100 mg by mouth daily.     . famotidine (PEPCID) 10 MG tablet Take 10 mg by mouth daily. PRN     . Ginger, Zingiber officinalis, (GINGER PO) Take by mouth.     Marland Kitchen HYDROcodone-homatropine (HYCODAN) 5-1.5 MG/5ML syrup Take 5 mLs by mouth every 6 (six) hours as needed for cough. 240 mL 0  . irbesartan (AVAPRO) 150 MG tablet Take 1 tablet (150 mg total) by mouth daily. 30 tablet 3  . mometasone (NASONEX) 50 MCG/ACT nasal spray Place 2 sprays into the nose daily. 1 each 3  . Multiple Vitamins-Minerals (MULTIVITAL PO) Take 1 tablet by mouth daily.     . pantoprazole (PROTONIX) 40 MG tablet Take 1 tablet (40 mg total) by mouth daily. 30 tablet 3  . potassium chloride SA (KLOR-CON) 20 MEQ tablet Take 1 tablet (20 mEq total)  by mouth 3 (three) times a week. 12 tablet 2  . predniSONE (DELTASONE) 10 MG tablet 4 x 3 days, 3 x 3 days, 2 x 3 days, 1 x 3 days then stop 30 tablet 0  . Probiotic Product (PROBIOTIC DAILY PO) Take 1 tablet by mouth daily.     . simvastatin (ZOCOR) 20 MG tablet Take 1 tablet (20 mg total) by mouth daily. 90 tablet 3  . Vaginal Lubricant (REPLENS VA) Place vaginally. cream    . Glycopyrrolate-Formoterol (BEVESPI AEROSPHERE) 9-4.8 MCG/ACT AERO Inhale 2 puffs into the lungs in the morning and at bedtime. 5.9 g 3  . Omega-3 Fatty Acids (FISH OIL) 1000 MG  CAPS Take by mouth. (Patient not taking: Reported on 01/03/2020)    . Turmeric (QC TUMERIC COMPLEX) 500 MG CAPS Take by mouth. (Patient not taking: Reported on 01/03/2020)     No facility-administered medications prior to visit.    Review of Systems  Review of Systems  HENT: Positive for congestion.   Respiratory: Positive for shortness of breath. Negative for cough.     Physical Exam  BP (!) 142/80 (BP Location: Left Arm, Cuff Size: Normal)   Pulse 81   Temp (!) 97.3 F (36.3 C)   Ht 5' 2.75" (1.594 m)   Wt 144 lb 12.8 oz (65.7 kg)   SpO2 97%   BMI 25.85 kg/m  Physical Exam Constitutional:      Appearance: Normal appearance.  HENT:     Mouth/Throat:     Mouth: Mucous membranes are moist.     Pharynx: Oropharynx is clear.  Cardiovascular:     Rate and Rhythm: Normal rate and regular rhythm.  Pulmonary:     Effort: Pulmonary effort is normal.     Breath sounds: Normal breath sounds.  Musculoskeletal:        General: Normal range of motion.  Skin:    General: Skin is warm and dry.  Neurological:     Mental Status: She is alert.  Psychiatric:        Mood and Affect: Mood normal.        Behavior: Behavior normal.        Thought Content: Thought content normal.        Judgment: Judgment normal.      Lab Results:  CBC    Component Value Date/Time   WBC 9.0 04/21/2019 1128   RBC 4.44 04/21/2019 1128   HGB 13.5 04/21/2019 1128   HCT 40.1 04/21/2019 1128   PLT 303.0 04/21/2019 1128   MCV 90.2 04/21/2019 1128   MCHC 33.6 04/21/2019 1128   RDW 14.0 04/21/2019 1128   LYMPHSABS 1.2 04/21/2019 1128   MONOABS 0.7 04/21/2019 1128   EOSABS 0.2 04/21/2019 1128   BASOSABS 0.0 04/21/2019 1128    BMET    Component Value Date/Time   NA 137 05/05/2019 1129   K 4.5 05/05/2019 1129   CL 98 05/05/2019 1129   CO2 24 05/05/2019 1129   GLUCOSE 115 (H) 05/05/2019 1129   GLUCOSE 86 04/21/2019 1128   BUN 22 05/05/2019 1129   CREATININE 1.11 (H) 05/05/2019 1129    CALCIUM 10.3 05/05/2019 1129   GFRNONAA 49 (L) 05/05/2019 1129   GFRAA 57 (L) 05/05/2019 1129    BNP No results found for: BNP  ProBNP    Component Value Date/Time   PROBNP 43.0 04/21/2019 1128    Imaging: CT Chest Wo Contrast  Result Date: 12/28/2019 CLINICAL DATA:  Sarcoidosis, wheezing and congestion since  July of 2021 EXAM: CT CHEST WITHOUT CONTRAST TECHNIQUE: Multidetector CT imaging of the chest was performed following the standard protocol without IV contrast. COMPARISON:  January 01, 2019 FINDINGS: Cardiovascular: Calcified atheromatous plaque in the thoracic aorta. No aneurysmal dilation. Three-vessel coronary artery disease. Heart size is normal without pericardial effusion and without change from the previous study. Central pulmonary vasculature at 2.7 cm from main pulmonary arterial caliber. Limited assessment of cardiovascular structures given lack of intravenous contrast. Mediastinum/Nodes: Calcified and partially calcified lymph nodes throughout the mediastinum without change. Hilar structures with limited assessment and distortion of RIGHT hilum secondary to fibrotic changes which are greater on the RIGHT than the LEFT and extend from the hilum into the peripheral upper and lower lobe. Esophagus grossly normal. No axillary adenopathy. Lungs/Pleura: Fibrotic changes and bronchiectatic changes with upper lobe predominance also extending in the superior segment of the RIGHT lower lobe are unchanged compared to the study from October of 2020 pleural and parenchymal scarring bilaterally with similar appearance. Ill-defined nodule in the LEFT lung base 5 mm similar to the prior study. Scattered small nodules throughout the LEFT lower lobe are grossly unchanged. Similar appearing nodules in the anterior LEFT upper lobe (image 53, series 5) 4 mm nodule as an example, clustered nodules towards the periphery. These are new compared to the prior study. Upper Abdomen: Incidental imaging of  upper abdominal contents without acute process. Musculoskeletal: Cardiac loop recorder over the LEFT chest similar to the prior study. No acute bone process. No destructive bone finding. IMPRESSION: 1. Fibrotic changes and bronchiectatic changes with upper lobe predominance are unchanged compared to the study from October of 2020 and extend from the hilum into the peripheral upper and lower lobe. 2. LEFT upper lobe nodules, clustered at the periphery of the anterior LEFT chest. These are new compared to the prior study. Findings could be related to sarcoidosis or superimposed infection. Overall there is little change however compared to the prior study. 3. Calcified and partially calcified lymph nodes throughout the mediastinum without change. 4. Three-vessel coronary artery disease. 5. Aortic atherosclerosis. Aortic Atherosclerosis (ICD10-I70.0). Electronically Signed   By: Zetta Bills M.D.   On: 12/28/2019 16:38   CUP PACEART REMOTE DEVICE CHECK  Result Date: 01/01/2020 ILR summary report received. Battery status OK. Normal device function. No new symptom episodes, brady, or pause episodes. No new AF episodes. 1 tachy episode, 7 seconds, NCT.  Monthly summary reports and ROV/PRN    Assessment & Plan:   COPD (chronic obstructive pulmonary disease) ([place]) - Moderate-severe airway obstruction on PFTs in October 2020. Increased dyspnea over the last 2-3 months and frequent exacerbations requiring oral steriods. More fatigue with ADLs. Recommend changing maintenance inhaler from Bevespi to [place] two puffs twice daily (1 month sample given). Refer to pulmonary rehab. FU in 4 weeks.    Martyn Ehrich, NP 01/27/2020

## 2020-01-27 ENCOUNTER — Encounter: Payer: Self-pay | Admitting: Primary Care

## 2020-01-27 ENCOUNTER — Other Ambulatory Visit: Payer: Self-pay

## 2020-01-27 ENCOUNTER — Telehealth: Payer: Self-pay | Admitting: Primary Care

## 2020-01-27 ENCOUNTER — Ambulatory Visit (INDEPENDENT_AMBULATORY_CARE_PROVIDER_SITE_OTHER): Payer: Medicare Other | Admitting: Primary Care

## 2020-01-27 VITALS — BP 142/80 | HR 81 | Temp 97.3°F | Ht 62.75 in | Wt 144.8 lb

## 2020-01-27 DIAGNOSIS — J441 Chronic obstructive pulmonary disease with (acute) exacerbation: Secondary | ICD-10-CM

## 2020-01-27 MED ORDER — BREZTRI AEROSPHERE 160-9-4.8 MCG/ACT IN AERO: 2.0000 | INHALATION_SPRAY | Freq: Two times a day (BID) | RESPIRATORY_TRACT | 0 refills | Status: DC

## 2020-01-27 NOTE — Telephone Encounter (Signed)
Can you put in a DME order for flutter valve for this patient re: COPD

## 2020-01-27 NOTE — Patient Instructions (Addendum)
Recommendations: - Stop Bevespi last dose 01/27/20  - Start Breztri 01/28/20- take two puffs twice daily (rinse mouth after use)  - Finish prednisone taper as prescribed  - At next visit we may consider adding medication called Singulair which is a leukotriene inhibitor used for asthma/allergies   Orders: - Refer to pulmonary rehab - Sample 1 months worth of Breztri   Follow-up: - 4 weeks with Dr. Lamonte Sakai or Beth    Budesonide; Glycopyrrolate; Formoterol inhalation aerosol What is this medicine? BUDESONIDE; GLYCOPYRROLATE; FORMOTEROL (byoo DES oh nide; glye koe PYE roe late; for [facility] te rol) inhalation is a combination of 3 medicines. Budesonide decreases inflammation in the lungs. Formoterol and glycopyrrolate are bronchodilators that help keep airways open. This medicine is used to treat chronic obstructive pulmonary disease (COPD). Do not use this drug combination for acute COPD attacks or bronchospasm. This medicine may be used for other purposes; ask your health care provider or pharmacist if you have questions. COMMON BRAND NAME(S): BREZTRI What should I tell my health care provider before I take this medicine? They need to know if you have any of these conditions:  bladder problems or trouble passing urine  bone problems  diabetes  eye disease, vision problems  heart disease  high blood pressure  history of irregular heartbeat  immune system problems  infection  kidney disease  pheochromocytoma  prostate disease  seizures  thyroid disease  an unusual or allergic reaction to budesonide, formoterol, glycopyrrolate, medicines, foods, dyes, or preservatives  pregnant or trying to get pregnant  breast-feeding How should I use this medicine? This medicine is inhaled through the mouth. Follow the directions on the prescription label. Shake well before each use. Rinse your mouth with water after use. Make sure not to swallow the water. Take your medicine at regular  intervals. Do not take your medicine more often than directed. Do not stop taking except on your doctor's advice. Make sure that you are using your inhaler correctly. This drug comes with INSTRUCTIONS FOR USE. Ask your pharmacist for directions on how to use this drug. Read the information carefully. Talk to your pharmacist or health care provider if you have questions. Talk to your pediatrician regarding the use of this medicine in children. This medicine is not approved for use in children. Overdosage: If you think you have taken too much of this medicine contact a poison control center or emergency room at once. NOTE: This medicine is only for you. Do not share this medicine with others. What if I miss a dose? If you miss a dose, use it as soon as you can. If it is almost time for your next dose, use only that dose. Do not use double or extra doses. What may interact with this medicine? Do not take the medicine with any of the following medications:  cisapride  dofetilide  dronedarone  MAOIs like Marplan, Nardil, and Parnate  other medicines that contain long-acting beta-2 agonists (LABAs) like arformoterol, formoterol, indacaterol, olodaterol, salmeterol, vilanterol  pimozide  procarbazine  thioridazine This medicine may also interact with the following medications:  antihistamines for allergy, cough, and cold  atropine  certain antibiotics like clarithromycin, telithromycin  certain antivirals for HIV or hepatitis  certain heart medicines like atenolol, metoprolol  certain medicines for bladder problems like oxybutynin, tolterodine  certain medicines for blood pressure, heart disease, irregular heartbeat  certain medicines for depression, anxiety, or psychotic disturbances  certain medicines for fungal infections like ketoconazole, itraconazole  certain medicines for  Parkinson's disease like benztropine, trihexyphenidyl  certain medicines for stomach problems like  dicyclomine, hyoscyamine  certain medicines for travel sickness like scopolamine  diuretics  grapefruit juice  mifepristone  other inhaled medicines that contain anticholinergics such as aclidinium, ipratropium, tiotropium, umeclidinium  other medicines that prolong the QT interval (an abnormal heart rhythm)  some vaccines  steroid medicines like prednisone or cortisone  stimulant medicines for attention disorders, weight loss, or to stay awake  theophylline This list may not describe all possible interactions. Give your health care provider a list of all the medicines, herbs, non-prescription drugs, or dietary supplements you use. Also tell them if you smoke, drink alcohol, or use illegal drugs. Some items may interact with your medicine. What should I watch for while using this medicine? Visit your health care professional for regular checks on your progress. Tell your health care professional if your symptoms do not start to get better or if they get worse. NEVER use this medicine for an acute asthma or COPD attack. You should use your short-acting rescue inhalers for this purpose. If your symptoms get worse or if you need your short-acting inhalers more often, call your doctor right away. This medicine may increase your risk of getting an infection. Tell your doctor or health care professional if you are around anyone with measles or chickenpox, or if you develop sores or blisters that do not heal properly. Do not get this medicine in your eyes. It can cause irritation, pain, or blurred vision. What side effects may I notice from receiving this medicine? Side effects that you should report to your doctor or health care professional as soon as possible:  allergic reactions like skin rash, itching or hives, swelling of the face, lips, or tongue  anxious  breathing problems  changes in vision, eye pain  muscle cramps or muscle pain  signs and symptoms of a dangerous change in  heartbeat or heart rhythm like chest pain; dizziness; fast or irregular heartbeat; palpitations; feeling faint or lightheaded, falls; breathing problems  signs and symptoms of high blood sugar such as being more thirsty or hungry or having to urinate more than normal. You may also feel very tired or have blurry vision  signs and symptoms of infection like fever; chills; cough; sore throat; pain or trouble passing urine  tremors  trouble passing urine or change in the amount of urine  unusually weak or tired  white patches in the mouth or mouth sores Side effects that usually do not require medical attention (report these to your doctor or health care professional if they continue or are bothersome):  back pain  changes in taste  cough  diarrhea  runny or stuffy nose  upset stomach This list may not describe all possible side effects. Call your doctor for medical advice about side effects. You may report side effects to FDA at 1-800-FDA-1088. Where should I keep my medicine? Keep out of the reach of children. Store in a dry place at room temperature between 15 and 30 degrees C (59 and 86 degrees F). Protect from heat. Do not freeze. Do not use or store near heat or flame, as the canister may burst. Throw away the inhaler 3 months after you open the foil pouch (for the 120-inhalation canister), or 3 weeks after you open the foil pouch (for the 28-inhalation canister), or when the dose indicator reaches zero "0", whichever comes first. NOTE: This sheet is a summary. It may not cover all possible information. If  you have questions about this medicine, talk to your doctor, pharmacist, or health care provider.  2020 Elsevier/Gold Standard (2018-10-13 18:58:00)   Montelukast oral tablets What is this medicine? MONTELUKAST (mon te LOO kast) is used to prevent and treat the symptoms of asthma. It is also used to treat allergies. Do not use for an acute asthma attack. This medicine may be  used for other purposes; ask your health care provider or pharmacist if you have questions. COMMON BRAND NAME(S): Singulair What should I tell my health care provider before I take this medicine? They need to know if you have any of these conditions:  liver disease  an unusual or allergic reaction to montelukast, other medicines, foods, dyes, or preservatives  pregnant or trying to get pregnant  breast-feeding How should I use this medicine? This medicine should be given by mouth. Follow the directions on the prescription label. Take this medicine at the same time every day. You may take this medicine with or without meals. Do not chew the tablets. Do not stop taking your medicine unless your doctor tells you to. Talk to your pediatrician regarding the use of this medicine in children. Special care may be needed. While this drug may be prescribed for children as young as 30 years of age for selected conditions, precautions do apply. Overdosage: If you think you have taken too much of this medicine contact a poison control center or emergency room at once. NOTE: This medicine is only for you. Do not share this medicine with others. What if I miss a dose? If you miss a dose, skip it. Take your next dose at the normal time. Do not take extra or 2 doses at the same time to make up for the missed dose. What may interact with this medicine?  anti-infectives like rifampin and rifabutin  medicines for seizures like phenytoin, phenobarbital, and carbamazepine This list may not describe all possible interactions. Give your health care provider a list of all the medicines, herbs, non-prescription drugs, or dietary supplements you use. Also tell them if you smoke, drink alcohol, or use illegal drugs. Some items may interact with your medicine. What should I watch for while using this medicine? Visit your doctor or health care professional for regular checks on your progress. Tell your doctor or health  care professional if your allergy or asthma symptoms do not improve. Take your medicine even when you do not have symptoms. Do not stop taking any of your medicine(s) unless your doctor tells you to. If you have asthma, talk to your doctor about what to do in an acute asthma attack. Always have your inhaled rescue medicine for asthma attacks with you. Patients and their families should watch for new or worsening thoughts of suicide or depression. Also watch for sudden changes in feelings such as feeling anxious, agitated, panicky, irritable, hostile, aggressive, impulsive, severely restless, overly excited and hyperactive, or not being able to sleep. Any worsening of mood or thoughts of suicide or dying should be reported to your health care professional right away. What side effects may I notice from receiving this medicine? Side effects that you should report to your doctor or health care professional as soon as possible:  allergic reactions like skin rash or hives, or swelling of the face, lips, or tongue  breathing problems  changes in emotions or moods  confusion  depressed mood  fever or infection  hallucinations  joint pain  painful lumps under the skin  pain, tingling, numbness  in the hands or feet  redness, blistering, peeling, or loosening of the skin, including inside the mouth  restlessness  seizures  sleep walking  signs and symptoms of infection like fever; chills; cough; sore throat; flu-like illness  signs and symptoms of liver injury like dark yellow or brown urine; general ill feeling or flu-like symptoms; light-colored stools; loss of appetite; nausea; right upper belly pain; unusually weak or tired; yellowing of the eyes or skin  sinus pain or swelling  stuttering  suicidal thoughts or other mood changes  tremors  trouble sleeping  uncontrolled muscle movements  unusual bleeding or bruising  vivid or bad dreams Side effects that usually do not  require medical attention (report to your doctor or health care professional if they continue or are bothersome):  dizziness  drowsiness  headache  runny nose  stomach upset  tiredness This list may not describe all possible side effects. Call your doctor for medical advice about side effects. You may report side effects to FDA at 1-800-FDA-1088. Where should I keep my medicine? Keep out of the reach of children. Store at room temperature between 15 and 30 degrees C (59 and 86 degrees F). Protect from light and moisture. Keep this medicine in the original bottle. Throw away any unused medicine after the expiration date. NOTE: This sheet is a summary. It may not cover all possible information. If you have questions about this medicine, talk to your doctor, pharmacist, or health care provider.  2020 Elsevier/Gold Standard (2018-07-03 12:54:33)

## 2020-01-27 NOTE — Assessment & Plan Note (Addendum)
-   Moderate-severe airway obstruction on PFTs in October 2020. Increased dyspnea over the last 2-3 months and frequent exacerbations requiring oral steriods. More fatigue with ADLs. Recommend changing maintenance inhaler from Bevespi to Manuelito two puffs twice daily (1 month sample given). Refer to pulmonary rehab. FU in 4 weeks.

## 2020-01-27 NOTE — Telephone Encounter (Signed)
AMB DME order placed for flutter valve. DME: [employer]. Nothing further needed at this time.

## 2020-01-28 ENCOUNTER — Telehealth: Payer: Self-pay | Admitting: Emergency Medicine

## 2020-01-28 DIAGNOSIS — J441 Chronic obstructive pulmonary disease with (acute) exacerbation: Secondary | ICD-10-CM

## 2020-01-28 DIAGNOSIS — D869 Sarcoidosis, unspecified: Secondary | ICD-10-CM

## 2020-01-28 NOTE — Telephone Encounter (Signed)
Called and spoke Kristen Suarez, with pulmonary rehab, she advised that while pulmonary rehab does not have a Market researcher, they cannot have patients with Medicare A&B in the program because they require and face to face assessment with the medical director every 30 days.  At this point, they either need an alternate diagnosis other than COPD or they can hold the referral until they get a Market researcher.  Beth, please advise.  Thank you.

## 2020-01-28 NOTE — Telephone Encounter (Signed)
Spoke with Kristen Suarez and gave her dx of Sarcoidosis  New referral was placed  Nothing further needed

## 2020-01-28 NOTE — Telephone Encounter (Signed)
Sarcoidosis

## 2020-01-31 ENCOUNTER — Encounter (HOSPITAL_COMMUNITY): Payer: Self-pay | Admitting: *Deleted

## 2020-01-31 NOTE — Progress Notes (Signed)
Received referral from Dr. Lamonte Sakai for this pt to participate in pulmonary rehab with the the diagnosis of Sarcoidosis of the Lung. Clinical review of pt follow up appt on 11/11 with Geraldo Pitter NP with Dr. Lamonte Sakai  Pulmonary office note and EP progress note.  Pt with Covid Risk Score - 4. Pt appropriate for scheduling for Pulmonary rehab.  Will forward to support staff for scheduling and verification of insurance eligibility/benefits with pt consent. Cherre Huger, BSN Cardiac and Training and development officer

## 2020-02-02 ENCOUNTER — Ambulatory Visit (INDEPENDENT_AMBULATORY_CARE_PROVIDER_SITE_OTHER): Payer: Medicare Other

## 2020-02-02 ENCOUNTER — Other Ambulatory Visit: Payer: Self-pay

## 2020-02-02 ENCOUNTER — Ambulatory Visit (INDEPENDENT_AMBULATORY_CARE_PROVIDER_SITE_OTHER): Payer: Medicare Other | Admitting: Rheumatology

## 2020-02-02 ENCOUNTER — Encounter: Payer: Self-pay | Admitting: Rheumatology

## 2020-02-02 VITALS — BP 163/79 | HR 76 | Resp 13 | Ht 62.75 in | Wt 147.4 lb

## 2020-02-02 DIAGNOSIS — Z8719 Personal history of other diseases of the digestive system: Secondary | ICD-10-CM

## 2020-02-02 DIAGNOSIS — G252 Other specified forms of tremor: Secondary | ICD-10-CM

## 2020-02-02 DIAGNOSIS — M19071 Primary osteoarthritis, right ankle and foot: Secondary | ICD-10-CM

## 2020-02-02 DIAGNOSIS — R55 Syncope and collapse: Secondary | ICD-10-CM | POA: Diagnosis not present

## 2020-02-02 DIAGNOSIS — M19041 Primary osteoarthritis, right hand: Secondary | ICD-10-CM

## 2020-02-02 DIAGNOSIS — Z820 Family history of epilepsy and other diseases of the nervous system: Secondary | ICD-10-CM

## 2020-02-02 DIAGNOSIS — M4004 Postural kyphosis, thoracic region: Secondary | ICD-10-CM | POA: Diagnosis not present

## 2020-02-02 DIAGNOSIS — M19042 Primary osteoarthritis, left hand: Secondary | ICD-10-CM

## 2020-02-02 DIAGNOSIS — J42 Unspecified chronic bronchitis: Secondary | ICD-10-CM

## 2020-02-02 DIAGNOSIS — Z961 Presence of intraocular lens: Secondary | ICD-10-CM

## 2020-02-02 DIAGNOSIS — Z86010 Personal history of colonic polyps: Secondary | ICD-10-CM

## 2020-02-02 DIAGNOSIS — M2550 Pain in unspecified joint: Secondary | ICD-10-CM

## 2020-02-02 DIAGNOSIS — M19072 Primary osteoarthritis, left ankle and foot: Secondary | ICD-10-CM

## 2020-02-02 DIAGNOSIS — I10 Essential (primary) hypertension: Secondary | ICD-10-CM

## 2020-02-02 DIAGNOSIS — D869 Sarcoidosis, unspecified: Secondary | ICD-10-CM

## 2020-02-02 DIAGNOSIS — Z8639 Personal history of other endocrine, nutritional and metabolic disease: Secondary | ICD-10-CM

## 2020-02-02 DIAGNOSIS — L821 Other seborrheic keratosis: Secondary | ICD-10-CM

## 2020-02-02 LAB — CUP PACEART REMOTE DEVICE CHECK
Date Time Interrogation Session: 20211116225123
Implantable Pulse Generator Implant Date: 20190109

## 2020-02-03 ENCOUNTER — Telehealth (HOSPITAL_COMMUNITY): Payer: Self-pay

## 2020-02-03 NOTE — Telephone Encounter (Signed)
Beth can you please respond to following My Chart message:  Received device on Tuesday evening.  Have read instructions 3 times, still unclear.  Does it come with an in-service?   Still taking Mucinex, still bringing up mucus (dark yellow for 3 days now).  Heard from the Pulmonary Lab this morning.  They will contact me in a few weeks for schedule.  Anticipate that I will start in January. Thank you.  Thank you

## 2020-02-03 NOTE — Telephone Encounter (Signed)
Called pt to see if she was interested in the Pulmonary rehab program pt stated that she was interested, advised pt that we are scheduling out until the first of the year to schedule

## 2020-02-03 NOTE — Telephone Encounter (Signed)
Pt insurance is active and benefits verified through Medicare a/b Co-pay 0, DED $203/$203 met, out of pocket 0/0 met, co-insurance 20%. no pre-authorization required.  2ndary insurance is active and benefits verified through [facility]. Co-pay 0, DED 0/0 met, out of pocket 0/0 met, co-insurance 0%. No pre-authorization required. Passport, [place] 02/03/2020_0 Terrence Dupont , REF# [id number]

## 2020-02-03 NOTE — Progress Notes (Signed)
Carelink Summary Report / Loop Recorder 

## 2020-02-04 NOTE — Telephone Encounter (Signed)
I would direct her to the internet or Youtube to search educational video on how to use acapella/flutter device. Or she can come in and a nurse can show her. Continue mucinex twice daily for 1 week. If still coughing up colored mucus in 3-5 days recommend she notify us.

## 2020-02-08 ENCOUNTER — Ambulatory Visit (INDEPENDENT_AMBULATORY_CARE_PROVIDER_SITE_OTHER): Payer: Medicare Other

## 2020-02-08 VITALS — Ht 62.0 in | Wt 142.0 lb

## 2020-02-08 DIAGNOSIS — Z0000 Encounter for general adult medical examination without abnormal findings: Secondary | ICD-10-CM | POA: Diagnosis not present

## 2020-02-08 NOTE — Progress Notes (Signed)
Subjective:   Kristen Suarez is a 74 y.o. female who presents for Medicare Annual (Subsequent) preventive examination.  I connected with Kristen Suarez today by telephone and verified that I am speaking with the correct person using two identifiers. Location patient: home Location provider: work Persons participating in the virtual visit: patient, Marine scientist.    I discussed the limitations, risks, security and privacy concerns of performing an evaluation and management service by telephone and the availability of in person appointments. I also discussed with the patient that there may be a patient responsible charge related to this service. The patient expressed understanding and verbally consented to this telephonic visit.    Interactive audio and video telecommunications were attempted between this provider and patient, however failed, due to patient having technical difficulties OR patient did not have access to video capability.  We continued and completed visit with audio only.  Some vital signs may be absent or patient reported.   Time Spent with patient on telephone encounter: 40 minutes     Review of Systems     Cardiac Risk Factors include: advanced age (>18men, >79 women);hypertension;dyslipidemia     Objective:    Today's Vitals   02/08/20 1333 02/08/20 1334  Weight: 142 lb (64.4 kg)   Height: 5\' 2"  (1.575 m)   PainSc:  5    Body mass index is 25.97 kg/m.  Advanced Directives 02/08/2020 01/27/2019  Does Patient Have a Medical Advance Directive? No No  Does patient want to make changes to medical advance directive? Yes (MAU/Ambulatory/Procedural Areas - Information given) -  Would patient like information on creating a medical advance directive? - No - Patient declined    Current Medications (verified) Outpatient Encounter Medications as of 02/08/2020  Medication Sig  . albuterol (PROVENTIL) (2.5 MG/3ML) 0.083% nebulizer solution Take 3 mLs (2.5 mg total) by  nebulization every 6 (six) hours.  Marland Kitchen albuterol (VENTOLIN HFA) 108 (90 Base) MCG/ACT inhaler Inhale 2 puffs into the lungs every 6 (six) hours as needed for wheezing or shortness of breath.  Marland Kitchen amLODipine (NORVASC) 5 MG tablet Take 1 tablet (5 mg total) by mouth daily.  . Budeson-Glycopyrrol-Formoterol (BREZTRI AEROSPHERE) 160-9-4.8 MCG/ACT AERO Inhale 2 puffs into the lungs in the morning and at bedtime.  Marland Kitchen CALCIUM PO Take 1 tablet by mouth daily.   Marland Kitchen docusate sodium (STOOL SOFTENER LAXATIVE) 100 MG capsule Take 100 mg by mouth daily.   . famotidine (PEPCID) 10 MG tablet Take 10 mg by mouth daily. PRN   . irbesartan (AVAPRO) 150 MG tablet Take 1 tablet (150 mg total) by mouth daily.  . mometasone (NASONEX) 50 MCG/ACT nasal spray Place 2 sprays into the nose daily.  . Multiple Vitamins-Minerals (MULTIVITAL PO) Take 1 tablet by mouth daily.   . pantoprazole (PROTONIX) 40 MG tablet Take 1 tablet (40 mg total) by mouth daily.  . potassium chloride SA (KLOR-CON) 20 MEQ tablet Take 1 tablet (20 mEq total) by mouth 3 (three) times a week.  . predniSONE (DELTASONE) 10 MG tablet 4 x 3 days, 3 x 3 days, 2 x 3 days, 1 x 3 days then stop  . Probiotic Product (PROBIOTIC DAILY PO) Take 1 tablet by mouth daily.   . simvastatin (ZOCOR) 20 MG tablet Take 1 tablet (20 mg total) by mouth daily.  . Vaginal Lubricant (REPLENS VA) Place vaginally. cream   No facility-administered encounter medications on file as of 02/08/2020.    Allergies (verified) Penicillins, Sulfa antibiotics, and Keflex [cephalexin]   History:  Past Medical History:  Diagnosis Date  . Arthritis   . Asthma   . Chickenpox   . Colon polyps 2010  . Heart murmur   . History of fainting spells of unknown cause   . Hyperlipidemia   . Hypertension   . Sarcoid 06/16/1976   Past Surgical History:  Procedure Laterality Date  . ABDOMINAL HYSTERECTOMY  1987  . LUNG BIOPSY  06/16/1976   Family History  Problem Relation Age of Onset  .  Arthritis Mother   . Cancer Mother   . Depression Mother   . Heart attack Mother   . Hyperlipidemia Mother   . Hypertension Mother   . Alcohol abuse Father   . Heart disease Father   . Hypertension Father   . Hyperlipidemia Father   . Heart disease Sister   . Stroke Sister   . Alcohol abuse Brother   . Heart disease Brother   . Hypertension Brother   . Hyperlipidemia Brother   . Multiple sclerosis Daughter   . Hyperlipidemia Son   . Heart disease Maternal Grandfather   . Heart disease Paternal Grandfather   . Hyperlipidemia Sister   . Alcohol abuse Sister   . Hyperlipidemia Sister   . Hypertension Sister   . Alcohol abuse Brother   . Early death Brother   . Heart attack Brother   . Heart disease Brother   . Hyperlipidemia Brother    Social History   Socioeconomic History  . Marital status: Married    Spouse name: Not on file  . Number of children: Not on file  . Years of education: Not on file  . Highest education level: Not on file  Occupational History  . Occupation: retired  Tobacco Use  . Smoking status: Never Smoker  . Smokeless tobacco: Never Used  Vaping Use  . Vaping Use: Never used  Substance and Sexual Activity  . Alcohol use: Yes    Comment: once in a while  . Drug use: Never  . Sexual activity: Not on file  Other Topics Concern  . Not on file  Social History Narrative  . Not on file   Social Determinants of Health   Financial Resource Strain: Low Risk   . Difficulty of Paying Living Expenses: Not hard at all  Food Insecurity: No Food Insecurity  . Worried About Charity fundraiser in the Last Year: Never true  . Ran Out of Food in the Last Year: Never true  Transportation Needs: No Transportation Needs  . Lack of Transportation (Medical): No  . Lack of Transportation (Non-Medical): No  Physical Activity: Sufficiently Active  . Days of Exercise per Week: 7 days  . Minutes of Exercise per Session: 30 min  Stress: No Stress Concern Present    . Feeling of Stress : Not at all  Social Connections: Moderately Isolated  . Frequency of Communication with Friends and Family: More than three times a week  . Frequency of Social Gatherings with Friends and Family: Once a week  . Attends Religious Services: Never  . Active Member of Clubs or Organizations: No  . Attends Archivist Meetings: Never  . Marital Status: Married    Tobacco Counseling Counseling given: Not Answered   Clinical Intake:  Pre-visit preparation completed: Yes  Pain : 0-10 Pain Score: 5  Pain Type: Chronic pain Pain Location: Rib cage Pain Orientation: Left Pain Onset: More than a month ago Pain Frequency: Constant     Nutritional Status: BMI 25 -  29 Overweight Nutritional Risks: None Diabetes: No  How often do you need to have someone help you when you read instructions, pamphlets, or other written materials from your doctor or pharmacy?: 1 - Never What is the last grade level you completed in school?: Master's degree  Diabetic?No  Interpreter Needed?: No  Information entered by :: Caroleen Hamman LPN   Activities of Daily Living In your present state of health, do you have any difficulty performing the following activities: 02/08/2020  Hearing? N  Vision? N  Difficulty concentrating or making decisions? N  Walking or climbing stairs? N  Dressing or bathing? N  Doing errands, shopping? N  Preparing Food and eating ? N  Using the Toilet? N  In the past six months, have you accidently leaked urine? N  Do you have problems with loss of bowel control? N  Managing your Medications? N  Managing your Finances? N  Housekeeping or managing your Housekeeping? N  Some recent data might be hidden    Patient Care Team: Nche, [name], NP as PCP - General (Internal Medicine) Deboraha Sprang, MD as PCP - Electrophysiology (Cardiology) Feliz Beam, MD as Referring Physician (Internal Medicine) Collene Gobble, MD as Consulting  Physician (Pulmonary Disease)  Indicate any recent Medical Services you may have received from other than [facility] providers in the past year (date may be approximate).     Assessment:   This is a routine wellness examination for Machell.  Hearing/Vision screen  Hearing Screening   125Hz  250Hz  500Hz  1000Hz  2000Hz  3000Hz  4000Hz  6000Hz  8000Hz   Right ear:           Left ear:           Comments: Mild hearing loss noted  Vision Screening Comments: Cataracts removed Reading glasses Last eye exam-10/2019-Dr. Demarco  Dietary issues and exercise activities discussed: Current Exercise Habits: Home exercise routine, Type of exercise: yoga;strength training/weights, Time (Minutes): 30, Frequency (Times/Week): 7, Weekly Exercise (Minutes/Week): 210, Exercise limited by: respiratory conditions(s)  Goals    . Increase physical activity      Depression Screen PHQ 2/9 Scores 02/08/2020 04/21/2019 04/14/2019 01/27/2019 04/14/2018  PHQ - 2 Score 0 0 0 0 0    Fall Risk Fall Risk  02/08/2020 04/21/2019 04/14/2019 01/27/2019 04/14/2018  Falls in the past year? 0 0 0 0 0  Number falls in past yr: 0 - - 0 -  Injury with Fall? 0 - - 0 -  Follow up Falls prevention discussed - - - -    Any stairs in or around the home? Yes  If so, are there any without handrails? No  Home free of loose throw rugs in walkways, pet beds, electrical cords, etc? Yes  Adequate lighting in your home to reduce risk of falls? Yes   ASSISTIVE DEVICES UTILIZED TO PREVENT FALLS:  Life alert? No  Use of a cane, walker or w/c? No  Grab bars in the bathroom? Yes  Shower chair or bench in shower? No  Elevated toilet seat or a handicapped toilet? No   TIMED UP AND GO:  Was the test performed? No . Phone visit   Cognitive Function:No cognitive impairment noted MMSE - Mini Mental State Exam 09/08/2018  Orientation to time 5  Orientation to Place 5  Registration 3  Attention/ Calculation 5  Recall 3  Language- name 2 objects 2   Language- repeat 1  Language- follow 3 step command 3  Language- read & follow direction 1  Write a sentence 1  Copy design 1  Total score 30        Immunizations Immunization History  Administered Date(s) Administered  . Fluad Quad(high Dose 65+) 11/19/2018  . Influenza, High Dose Seasonal PF 12/11/2017  . Influenza-Unspecified 11/24/2019, 11/24/2019  . PFIZER SARS-COV-2 Vaccination 05/14/2019, 06/08/2019, 12/20/2019  . Pneumococcal Conjugate-13 03/23/2014  . Pneumococcal Polysaccharide-23 03/24/2012  . Tdap 03/24/2012    TDAP status: Up to date   Flu Vaccine status: Up to date   Pneumococcal vaccine status: Up to date   Covid-19 vaccine status: Completed vaccines  Qualifies for Shingles Vaccine? Yes   Zostavax completed No   Shingrix Completed?: No.    Education has been provided regarding the importance of this vaccine. Patient has been advised to call insurance company to determine out of pocket expense if they have not yet received this vaccine. Advised may also receive vaccine at local pharmacy or Health Dept. Verbalized acceptance and understanding.  Screening Tests Health Maintenance  Topic Date Due  . Hepatitis C Screening  Never done  . MAMMOGRAM  02/24/2021  . TETANUS/TDAP  03/24/2022  . COLONOSCOPY  01/21/2026  . INFLUENZA VACCINE  Completed  . DEXA SCAN  Completed  . COVID-19 Vaccine  Completed  . PNA vac Low Risk Adult  Completed    Health Maintenance  Health Maintenance Due  Topic Date Due  . Hepatitis C Screening  Never done    Colorectal cancer screening: Completed Colonoscopy 01/22/2016. Repeat every 10 years   Mammogram status: Scheduled for 02/28/20  Bone Density status: Scheduled for 03/14/20  Lung Cancer Screening: (Low Dose CT Chest recommended if Age 56-80 years, 30 pack-year currently smoking OR have quit w/in 15years.) does not qualify.     Additional Screening:  Hepatitis C Screening: does qualify; Discuss with  PCP  Vision Screening: Recommended annual ophthalmology exams for early detection of glaucoma and other disorders of the eye. Is the patient up to date with their annual eye exam?  Yes  Who is the provider or what is the name of the office in which the patient attends annual eye exams? Dr.Demarco  Dental Screening: Recommended annual dental exams for proper oral hygiene  Community Resource Referral / Chronic Care Management: CRR required this visit?  No   CCM required this visit?  No      Plan:     I have personally reviewed and noted the following in the patient's chart:   . Medical and social history . Use of alcohol, tobacco or illicit drugs  . Current medications and supplements . Functional ability and status . Nutritional status . Physical activity . Advanced directives . List of other physicians . Hospitalizations, surgeries, and ER visits in previous 12 months . Vitals . Screenings to include cognitive, depression, and falls . Referrals and appointments  In addition, I have reviewed and discussed with patient certain preventive protocols, quality metrics, and best practice recommendations. A written personalized care plan for preventive services as well as general preventive health recommendations were provided to patient.   Due to this being a telephonic visit, the after visit summary with patients personalized plan was offered to patient via mail or my-chart. Patient would like to access on my-chart.   Marta Antu, LPN   [id number]  Nurse Health Advisor  Nurse Notes: None

## 2020-02-08 NOTE — Patient Instructions (Addendum)
[name] , Thank you for taking time to complete your Medicare Wellness Visit. I appreciate your ongoing commitment to your health goals. Please review the following plan we discussed and let me know if I can assist you in the future.   Screening recommendations/referrals: Colonoscopy: Completed 01/22/2016. Screening no longer required after age 74. Mammogram: Scheduled for 02/28/2020 Bone Density: Scheduled for 03/14/2020 Recommended yearly ophthalmology/optometry visit for glaucoma screening and checkup Recommended yearly dental visit for hygiene and checkup  Vaccinations: Influenza vaccine: Up to date Pneumococcal vaccine: Completed vaccines Tdap vaccine: Up to date-Due-03/24/2022 Shingles vaccine: Discuss with pharmacy   Covid-19:Completed vaccines  Advanced directives: Information mailed today  Conditions/risks identified: See problem list  Next appointment: Follow up in one year for your annual wellness visit  02/13/2021 @ 1:30pm   Preventive Care 65 Years and Older, Female Preventive care refers to lifestyle choices and visits with your health care provider that can promote health and wellness. What does preventive care include?  A yearly physical exam. This is also called an annual well check.  Dental exams once or twice a year.  Routine eye exams. Ask your health care provider how often you should have your eyes checked.  Personal lifestyle choices, including:  Daily care of your teeth and gums.  Regular physical activity.  Eating a healthy diet.  Avoiding tobacco and drug use.  Limiting alcohol use.  Practicing safe sex.  Taking low-dose aspirin every day.  Taking vitamin and mineral supplements as recommended by your health care provider. What happens during an annual well check? The services and screenings done by your health care provider during your annual well check will depend on your age, overall health, lifestyle risk factors, and family history of  disease. Counseling  Your health care provider may ask you questions about your:  Alcohol use.  Tobacco use.  Drug use.  Emotional well-being.  Home and relationship well-being.  Sexual activity.  Eating habits.  History of falls.  Memory and ability to understand (cognition).  Work and work Statistician.  Reproductive health. Screening  You may have the following tests or measurements:  Height, weight, and BMI.  Blood pressure.  Lipid and cholesterol levels. These may be checked every 5 years, or more frequently if you are over 54 years old.  Skin check.  Lung cancer screening. You may have this screening every year starting at age 23 if you have a 30-pack-year history of smoking and currently smoke or have quit within the past 15 years.  Fecal occult blood test (FOBT) of the stool. You may have this test every year starting at age 82.  Flexible sigmoidoscopy or colonoscopy. You may have a sigmoidoscopy every 5 years or a colonoscopy every 10 years starting at age 22.  Hepatitis C blood test.  Hepatitis B blood test.  Sexually transmitted disease (STD) testing.  Diabetes screening. This is done by checking your blood sugar (glucose) after you have not eaten for a while (fasting). You may have this done every 1-3 years.  Bone density scan. This is done to screen for osteoporosis. You may have this done starting at age 57.  Mammogram. This may be done every 1-2 years. Talk to your health care provider about how often you should have regular mammograms. Talk with your health care provider about your test results, treatment options, and if necessary, the need for more tests. Vaccines  Your health care provider may recommend certain vaccines, such as:  Influenza vaccine. This is recommended  every year.  Tetanus, diphtheria, and acellular pertussis (Tdap, Td) vaccine. You may need a Td booster every 10 years.  Zoster vaccine. You may need this after age  19.  Pneumococcal 13-valent conjugate (PCV13) vaccine. One dose is recommended after age 86.  Pneumococcal polysaccharide (PPSV23) vaccine. One dose is recommended after age 70. Talk to your health care provider about which screenings and vaccines you need and how often you need them. This information is not intended to replace advice given to you by your health care provider. Make sure you discuss any questions you have with your health care provider. Document Released: 03/31/2015 Document Revised: 11/22/2015 Document Reviewed: 01/03/2015 Elsevier Interactive Patient Education  2017 [place] Prevention in the Home Falls can cause injuries. They can happen to people of all ages. There are many things you can do to make your home safe and to help prevent falls. What can I do on the outside of my home?  Regularly fix the edges of walkways and driveways and fix any cracks.  Remove anything that might make you trip as you walk through a door, such as a raised step or threshold.  Trim any bushes or trees on the path to your home.  Use bright outdoor lighting.  Clear any walking paths of anything that might make someone trip, such as rocks or tools.  Regularly check to see if handrails are loose or broken. Make sure that both sides of any steps have handrails.  Any raised decks and porches should have guardrails on the edges.  Have any leaves, snow, or ice cleared regularly.  Use sand or salt on walking paths during winter.  Clean up any spills in your garage right away. This includes oil or grease spills. What can I do in the bathroom?  Use night lights.  Install grab bars by the toilet and in the tub and shower. Do not use towel bars as grab bars.  Use non-skid mats or decals in the tub or shower.  If you need to sit down in the shower, use a plastic, non-slip stool.  Keep the floor dry. Clean up any water that spills on the floor as soon as it happens.  Remove  soap buildup in the tub or shower regularly.  Attach bath mats securely with double-sided non-slip rug tape.  Do not have throw rugs and other things on the floor that can make you trip. What can I do in the bedroom?  Use night lights.  Make sure that you have a light by your bed that is easy to reach.  Do not use any sheets or blankets that are too big for your bed. They should not hang down onto the floor.  Have a firm chair that has side arms. You can use this for support while you get dressed.  Do not have throw rugs and other things on the floor that can make you trip. What can I do in the kitchen?  Clean up any spills right away.  Avoid walking on wet floors.  Keep items that you use a lot in easy-to-reach places.  If you need to reach something above you, use a strong step stool that has a grab bar.  Keep electrical cords out of the way.  Do not use floor polish or wax that makes floors slippery. If you must use wax, use non-skid floor wax.  Do not have throw rugs and other things on the floor that can make you trip. What  can I do with my stairs?  Do not leave any items on the stairs.  Make sure that there are handrails on both sides of the stairs and use them. Fix handrails that are broken or loose. Make sure that handrails are as long as the stairways.  Check any carpeting to make sure that it is firmly attached to the stairs. Fix any carpet that is loose or worn.  Avoid having throw rugs at the top or bottom of the stairs. If you do have throw rugs, attach them to the floor with carpet tape.  Make sure that you have a light switch at the top of the stairs and the bottom of the stairs. If you do not have them, ask someone to add them for you. What else can I do to help prevent falls?  Wear shoes that:  Do not have high heels.  Have rubber bottoms.  Are comfortable and fit you well.  Are closed at the toe. Do not wear sandals.  If you use a  stepladder:  Make sure that it is fully opened. Do not climb a closed stepladder.  Make sure that both sides of the stepladder are locked into place.  Ask someone to hold it for you, if possible.  Clearly mark and make sure that you can see:  Any grab bars or handrails.  First and last steps.  Where the edge of each step is.  Use tools that help you move around (mobility aids) if they are needed. These include:  Canes.  Walkers.  Scooters.  Crutches.  Turn on the lights when you go into a dark area. Replace any light bulbs as soon as they burn out.  Set up your furniture so you have a clear path. Avoid moving your furniture around.  If any of your floors are uneven, fix them.  If there are any pets around you, be aware of where they are.  Review your medicines with your doctor. Some medicines can make you feel dizzy. This can increase your chance of falling. Ask your doctor what other things that you can do to help prevent falls. This information is not intended to replace advice given to you by your health care provider. Make sure you discuss any questions you have with your health care provider. Document Released: 12/29/2008 Document Revised: 08/10/2015 Document Reviewed: 04/08/2014 Elsevier Interactive Patient Education  2017 [employer].

## 2020-02-14 ENCOUNTER — Other Ambulatory Visit: Payer: Self-pay

## 2020-02-14 ENCOUNTER — Ambulatory Visit (INDEPENDENT_AMBULATORY_CARE_PROVIDER_SITE_OTHER): Payer: Medicare Other | Admitting: Obstetrics and Gynecology

## 2020-02-14 ENCOUNTER — Encounter: Payer: Self-pay | Admitting: Obstetrics and Gynecology

## 2020-02-14 VITALS — BP 180/84 | HR 80 | Ht 62.0 in | Wt 143.9 lb

## 2020-02-14 DIAGNOSIS — N8111 Cystocele, midline: Secondary | ICD-10-CM

## 2020-02-14 NOTE — Progress Notes (Signed)
Ms Bellissimo is here today wanting her cystocele checked. See note from 10/20. She reports no change in Sx, since last year  HM UTP and managed by PCP  PE AF VSS Lungs clear Heart RRR ABd soft + BS GU Nl EGBUS mild vaginal atrophy midline cystocele to hymenal ring in supine position and with valsalva.   A/P Pelvic relaxation, cystocele  Discussed referral to UroGYN for further eval and management. She desires. Referral made. F/U PRN

## 2020-02-18 NOTE — Telephone Encounter (Signed)
Called patient to see if she was interested in participating in the Pulmonary Rehab Program. Patient stated yes. Patient will come in for orientation on 03/31/20 @ 9AM and will attend the 1:15PM exercise class.  Tourist information centre manager.

## 2020-02-18 NOTE — Progress Notes (Signed)
[facility] New Patient Evaluation and Consultation  Referring Provider: Chancy Milroy, MD PCP: Flossie Buffy, NP Date of Service: 02/23/2020  SUBJECTIVE Chief Complaint: New Patient (Initial Visit) (Dr Rip Harbour referral) - prolapse  History of Present Illness: Kristen Suarez is a 74 y.o. White or Caucasian female seen in consultation at the request of Dr. Rip Harbour for evaluation of prolapse.    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 Has tried a pessary in the past.   Urinary Symptoms: Leaks urine with cough/ sneeze, laughing and with movement to the bathroom Leaks sometimes- very occasional Pad use: occasionally wears lines Kristen Suarez is bothered by her UI symptoms.  Day time voids 5-6.  Nocturia: 3-4 times per night to void. Voiding dysfunction: Kristen Suarez empties her bladder well.  does not use a catheter to empty bladder.  When urinating, Kristen Suarez feels Kristen Suarez has no difficulties  UTIs: 0 UTI's in the last year.   Denies history of blood in urine and kidney or bladder stones  Pelvic Organ Prolapse Symptoms:                  Kristen Suarez Admits to a feeling of a bulge the vaginal area. It has been present since 2017 Kristen Suarez Denies seeing a bulge.  This bulge is bothersome. Has used a pessary and it did not work out well for her- it was a ring and was never comfortable.  Has done pelvic PT and continues to do exercises, has also done some online sessions.  Uses replens as a vaginal moisturizer.   Bowel Symptom: Bowel movements: 1-2 time(s) per day Stool consistency: soft  Straining: no.  Splinting: no.  Incomplete evacuation: no.  Kristen Suarez Admits to accidental bowel leakage / fecal incontinence- happened once with a hard cough Bowel regimen: stool softener Last colonoscopy: Date 2019, Results negative  Sexual Function Sexually active: no.  Sexual orientation: Straight  Pelvic Pain Denies pelvic pain   Past Medical History:  Past Medical History:   Diagnosis Date  . Arthritis   . Asthma   . Chickenpox   . Colon polyps 2010  . Heart murmur   . History of fainting spells of unknown cause   . Hyperlipidemia   . Hypertension   . Sarcoid 06/16/1976     Past Surgical History:   Past Surgical History:  Procedure Laterality Date  . ABDOMINAL HYSTERECTOMY  1987  . LUNG BIOPSY  06/16/1976     Past OB/GYN History: G3 P3 Vaginal deliveries: 3,  Forceps/ Vacuum deliveries: 0, Cesarean section: 0 Menopausal: Yes, at age 23, Denies vaginal bleeding since menopause Contraception: n/a. Any history of abnormal pap smears: no.   Medications: Kristen Suarez has a current medication list which includes the following prescription(s): albuterol, albuterol, amlodipine, breztri aerosphere, calcium, docusate sodium, famotidine, irbesartan, mometasone, multiple vitamins-minerals, pantoprazole, potassium chloride sa, probiotic product, simvastatin, and vaginal lubricant.   Allergies: Patient is allergic to penicillins, sulfa antibiotics, and keflex [cephalexin].   Social History:  Social History   Tobacco Use  . Smoking status: Never Smoker  . Smokeless tobacco: Never Used  Vaping Use  . Vaping Use: Never used  Substance Use Topics  . Alcohol use: Yes    Comment: once in a while  . Drug use: Never    Relationship status: married Kristen Suarez lives with husband.   Kristen Suarez is not employed. Regular exercise: Yes: Yoga and physical therapy History of abuse: No  Family History:   Family History  Problem Relation  Age of Onset  . Arthritis Mother   . Cancer Mother   . Depression Mother   . Heart attack Mother   . Hyperlipidemia Mother   . Hypertension Mother   . Alcohol abuse Father   . Heart disease Father   . Hypertension Father   . Hyperlipidemia Father   . Heart disease Sister   . Stroke Sister   . Alcohol abuse Brother   . Heart disease Brother   . Hypertension Brother   . Hyperlipidemia Brother   . Multiple sclerosis Daughter   .  Hyperlipidemia Son   . Heart disease Maternal Grandfather   . Heart disease Paternal Grandfather   . Hyperlipidemia Sister   . Alcohol abuse Sister   . Hyperlipidemia Sister   . Hypertension Sister   . Alcohol abuse Brother   . Early death Brother   . Heart attack Brother   . Heart disease Brother   . Hyperlipidemia Brother      Review of Systems: Review of Systems  Constitutional: Negative for fever, malaise/fatigue and weight loss.  Respiratory: Positive for shortness of breath. Negative for cough and wheezing.   Cardiovascular: Negative for chest pain, palpitations and leg swelling.  Gastrointestinal: Negative for abdominal pain and blood in stool.  Genitourinary: Negative for dysuria.  Musculoskeletal: Negative for myalgias.  Skin: Negative for rash.  Neurological: Negative for dizziness and headaches.  Endo/Heme/Allergies: Bruises/bleeds easily.  Psychiatric/Behavioral: Negative for depression. The patient is nervous/anxious.      OBJECTIVE Physical Exam: Vitals:   02/23/20 1340  BP: (!) 190/81  Pulse: 86  Temp: 98.4 F (36.9 C)  Weight: 144 lb (65.3 kg)  Height: 5' 2.75" (1.594 m)    Physical Exam Constitutional:      General: Kristen Suarez is not in acute distress. Pulmonary:     Effort: Pulmonary effort is normal.  Abdominal:     General: There is no distension.     Palpations: Abdomen is soft.     Tenderness: There is no abdominal tenderness. There is no rebound.  Musculoskeletal:        General: No swelling. Normal range of motion.  Skin:    General: Skin is warm and dry.     Findings: No rash.  Neurological:     Mental Status: Kristen Suarez is alert and oriented to person, place, and time.  Psychiatric:        Mood and Affect: Mood normal.        Behavior: Behavior normal.     GU / Detailed Urogynecologic Evaluation:  Pelvic Exam: Normal external female genitalia; Bartholin's and Skene's glands normal in appearance; urethral meatus normal in appearance, no  urethral masses or discharge.   CST: negative  s/p hysterectomy: Speculum exam reveals normal vaginal mucosa with  atrophy and normal vaginal cuff.  Adnexa no mass, fullness, tenderness.     Pelvic floor strength III/V  Pelvic floor musculature: Right levator non-tender, Right obturator non-tender, Left levator non-tender, Left obturator non-tender  POP-Q:   POP-Q  0.5                                            Aa   0.5  Ba  -5                                              C   4                                            Gh  5.5                                            Pb  7                                            tvl   -2                                            Ap  -2                                            Bp                                                 D     Rectal Exam:  Normal external rectum  Post-Void Residual (PVR) by Bladder Scan: In order to evaluate bladder emptying, we discussed obtaining a postvoid residual and Kristen Suarez agreed to this procedure.  Procedure: The ultrasound unit was placed on the patient's abdomen in the suprapubic region after the patient had voided. A PVR of 23 ml was obtained by bladder scan.  Laboratory Results: POC urine: negative  I visualized the urine specimen, noting the specimen to be clear yellow  ASSESSMENT AND PLAN Kristen Suarez is a 74 y.o. with:  1. Prolapse of anterior vaginal wall   2. Vaginal vault prolapse after hysterectomy   3. SUI (stress urinary incontinence, female)   4. Nocturia    1. Stage II anterior, Stage I posterior, Stage I apical prolapse - For treatment of pelvic organ prolapse, we discussed options for management including expectant management, conservative management, and surgical management, such as Kegels, a pessary, pelvic floor physical therapy, and specific surgical procedures. - Kristen Suarez is interested in continuing with pelvic floor physical  therapy. Referral placed.  - Discussed that if symptoms worsen or Kristen Suarez has difficulty voiding then Kristen Suarez should return for an evaluation.   2. SUI For treatment of stress urinary incontinence,  non-surgical options include expectant management, weight loss, physical therapy, as well as a pessary and surgical options.  - This occurs rarely at this time and Kristen Suarez would like to expectantly manage and continue pelvic floor PT  3. Nocturia - not bothersome at this time - POC urine negative for infection - empties bladder well, PVR  normal  Return as needed.   Jaquita Folds, MD   Medical Decision Making:  - Reviewed/ ordered a clinical laboratory test - Review and summation of prior records - Independent review of urine specimen

## 2020-02-23 ENCOUNTER — Encounter: Payer: Self-pay | Admitting: Obstetrics and Gynecology

## 2020-02-23 ENCOUNTER — Encounter: Payer: Self-pay | Admitting: Nurse Practitioner

## 2020-02-23 ENCOUNTER — Other Ambulatory Visit: Payer: Self-pay

## 2020-02-23 ENCOUNTER — Ambulatory Visit (INDEPENDENT_AMBULATORY_CARE_PROVIDER_SITE_OTHER): Payer: Medicare Other | Admitting: Obstetrics and Gynecology

## 2020-02-23 VITALS — BP 190/81 | HR 86 | Temp 98.4°F | Ht 62.75 in | Wt 144.0 lb

## 2020-02-23 DIAGNOSIS — N993 Prolapse of vaginal vault after hysterectomy: Secondary | ICD-10-CM | POA: Diagnosis not present

## 2020-02-23 DIAGNOSIS — R351 Nocturia: Secondary | ICD-10-CM

## 2020-02-23 DIAGNOSIS — N393 Stress incontinence (female) (male): Secondary | ICD-10-CM | POA: Diagnosis not present

## 2020-02-23 DIAGNOSIS — N8110 Cystocele, unspecified: Secondary | ICD-10-CM

## 2020-02-23 LAB — POCT URINALYSIS DIPSTICK
Appearance: NORMAL
Bilirubin, UA: NEGATIVE
Blood, UA: NEGATIVE
Glucose, UA: NEGATIVE
Ketones, UA: NEGATIVE
Leukocytes, UA: NEGATIVE
Nitrite, UA: NEGATIVE
Protein, UA: NEGATIVE
Spec Grav, UA: 1.02 (ref 1.010–1.025)
Urobilinogen, UA: 0.2 E.U./dL
pH, UA: 5 (ref 5.0–8.0)

## 2020-02-23 NOTE — Patient Instructions (Signed)
You have a stage 2 (out of 4) prolapse.  We discussed the fact that it is not life threatening but there are several treatment options. For treatment of pelvic organ prolapse, we discussed options for management including expectant management, conservative management, and surgical management, such as Kegels, a pessary, pelvic floor physical therapy, and specific surgical procedures.    

## 2020-02-24 ENCOUNTER — Ambulatory Visit: Payer: Medicare Other | Admitting: Primary Care

## 2020-02-28 ENCOUNTER — Other Ambulatory Visit: Payer: Self-pay

## 2020-02-28 ENCOUNTER — Ambulatory Visit
Admission: RE | Admit: 2020-02-28 | Discharge: 2020-02-28 | Disposition: A | Discharge status: Home / Self Care | Payer: Medicare Other | Source: Ambulatory Visit | Attending: Obstetrics and Gynecology | Admitting: Obstetrics and Gynecology

## 2020-02-28 DIAGNOSIS — Z1231 Encounter for screening mammogram for malignant neoplasm of breast: Secondary | ICD-10-CM

## 2020-02-29 ENCOUNTER — Encounter: Payer: Self-pay | Admitting: Nurse Practitioner

## 2020-02-29 ENCOUNTER — Ambulatory Visit (INDEPENDENT_AMBULATORY_CARE_PROVIDER_SITE_OTHER): Payer: Medicare Other | Admitting: Nurse Practitioner

## 2020-02-29 VITALS — BP 150/90 | HR 68 | Temp 96.4°F | Ht 62.0 in | Wt 143.8 lb

## 2020-02-29 DIAGNOSIS — I10 Essential (primary) hypertension: Secondary | ICD-10-CM | POA: Diagnosis not present

## 2020-02-29 LAB — CBC
HCT: 41.3 % (ref 36.0–46.0)
Hemoglobin: 13.9 g/dL (ref 12.0–15.0)
MCHC: 33.6 g/dL (ref 30.0–36.0)
MCV: 91.1 fl (ref 78.0–100.0)
Platelets: 313 10*3/uL (ref 150.0–400.0)
RBC: 4.53 Mil/uL (ref 3.87–5.11)
RDW: 13.6 % (ref 11.5–15.5)
WBC: 6.4 10*3/uL (ref 4.0–10.5)

## 2020-02-29 LAB — BASIC METABOLIC PANEL
BUN: 26 mg/dL — ABNORMAL HIGH (ref 6–23)
CO2: 28 mEq/L (ref 19–32)
Calcium: 9.8 mg/dL (ref 8.4–10.5)
Chloride: 99 mEq/L (ref 96–112)
Creatinine, Ser: 1.08 mg/dL (ref 0.40–1.20)
GFR: 50.57 mL/min — ABNORMAL LOW (ref >60.00)
Glucose, Bld: 92 mg/dL (ref 70–99)
Potassium: 3.5 mEq/L (ref 3.5–5.1)
Sodium: 137 mEq/L (ref 135–145)

## 2020-02-29 LAB — TSH: TSH: 2.32 u[IU]/mL (ref 0.35–4.50)

## 2020-02-29 MED ORDER — IRBESARTAN 300 MG PO TABS: 300.0000 mg | ORAL_TABLET | Freq: Every day | ORAL | 3 refills | Status: DC

## 2020-02-29 NOTE — Assessment & Plan Note (Addendum)
Elevated BP for last 49months. Denies any new medication or change in diet Compliant with irbesartan and amlodipine Asymptomatic Last echo completed 12/2018 BP Readings from Last 3 Encounters:  02/29/20 (!) 150/90  02/23/20 (!) 190/81  02/14/20 (!) 180/84   Increase irbesartan to 300mg  and maintain amlodipine dose. Repeat cbc, BMP and TSH F/up in 80month

## 2020-02-29 NOTE — Progress Notes (Signed)
Subjective:  Patient ID: Kristen Suarez, female    DOB: Feb 25, 1946  Age: 74 y.o. MRN: [id number]  CC: Acute Visit (Pt c/o high bp readings, pt states that her bp has been consistently high for most of the year she has been documenting, at least a weekly bp in her phone, pt stataes the last time it was low was in august running at 124/74, since the its been high majority of the time. )  HPI  HTN (hypertension), benign Elevated BP for last 37months. Denies any new medication or change in diet Compliant with irbesartan and amlodipine Asymptomatic BP Readings from Last 3 Encounters:  02/29/20 (!) 150/90  02/23/20 (!) 190/81  02/14/20 (!) 180/84   Increase irbesartan to 300mg  and maintain amlodipine dose. Repeat BMP and TSH  BP Readings from Last 3 Encounters:  02/29/20 (!) 150/90  02/23/20 (!) 190/81  02/14/20 (!) 180/84    Reviewed past Medical, Social and Family history today.  Outpatient Medications Prior to Visit  Medication Sig Dispense Refill  . albuterol (PROVENTIL) (2.5 MG/3ML) 0.083% nebulizer solution Take 3 mLs (2.5 mg total) by nebulization every 6 (six) hours. 150 mL 2  . albuterol (VENTOLIN HFA) 108 (90 Base) MCG/ACT inhaler Inhale 2 puffs into the lungs every 6 (six) hours as needed for wheezing or shortness of breath. 8 g 6  . amLODipine (NORVASC) 5 MG tablet Take 1 tablet (5 mg total) by mouth daily. 180 tablet 3  . Budeson-Glycopyrrol-Formoterol (BREZTRI AEROSPHERE) 160-9-4.8 MCG/ACT AERO Inhale 2 puffs into the lungs in the morning and at bedtime. 5.9 g 0  . CALCIUM PO Take 1 tablet by mouth daily.     Marland Kitchen docusate sodium (COLACE) 100 MG capsule Take 100 mg by mouth daily.     . famotidine (PEPCID) 10 MG tablet Take 10 mg by mouth daily. PRN    . mometasone (NASONEX) 50 MCG/ACT nasal spray Place 2 sprays into the nose daily. 1 each 3  . Multiple Vitamins-Minerals (MULTIVITAL PO) Take 1 tablet by mouth daily.     . pantoprazole (PROTONIX) 40 MG tablet Take 1  tablet (40 mg total) by mouth daily. 30 tablet 3  . Probiotic Product (PROBIOTIC DAILY PO) Take 1 tablet by mouth daily.     . simvastatin (ZOCOR) 20 MG tablet Take 1 tablet (20 mg total) by mouth daily. 90 tablet 3  . Vaginal Lubricant (REPLENS VA) Place vaginally. cream    . irbesartan (AVAPRO) 150 MG tablet Take 1 tablet (150 mg total) by mouth daily. 30 tablet 3  . potassium chloride SA (KLOR-CON) 20 MEQ tablet Take 1 tablet (20 mEq total) by mouth 3 (three) times a week. (Patient not taking: Reported on 02/29/2020) 12 tablet 2   No facility-administered medications prior to visit.    ROS See HPI  Objective:  BP (!) 150/90 (BP Location: Left Arm, Patient Position: Sitting, Cuff Size: Normal)   Pulse 68   Temp (!) 96.4 F (35.8 C) (Temporal)   Ht 5\' 2"  (1.575 m)   Wt 143 lb 12.8 oz (65.2 kg)   SpO2 94%   BMI 26.30 kg/m   Physical Exam Cardiovascular:     Rate and Rhythm: Normal rate and regular rhythm.     Pulses: Normal pulses.     Heart sounds: Murmur heard.    Pulmonary:     Effort: Pulmonary effort is normal.     Breath sounds: Normal breath sounds.  Musculoskeletal:     Right lower leg: No  edema.     Left lower leg: No edema.  Neurological:     Mental Status: She is alert and oriented to person, place, and time.    Assessment & Plan:  This visit occurred during the SARS-CoV-2 public health emergency.  Safety protocols were in place, including screening questions prior to the visit, additional usage of staff PPE, and extensive cleaning of exam room while observing appropriate contact time as indicated for disinfecting solutions.   Iraida was seen today for acute visit.  Diagnoses and all orders for this visit:  HTN (hypertension), benign -     Basic metabolic panel -     TSH -     irbesartan (AVAPRO) 300 MG tablet; Take 1 tablet (300 mg total) by mouth daily. -     Cancel: CBC w/Diff -     CBC   Problem List Items Addressed This Visit       Cardiovascular and Mediastinum   HTN (hypertension), benign - Primary    Elevated BP for last 46months. Denies any new medication or change in diet Compliant with irbesartan and amlodipine Asymptomatic BP Readings from Last 3 Encounters:  02/29/20 (!) 150/90  02/23/20 (!) 190/81  02/14/20 (!) 180/84   Increase irbesartan to 300mg  and maintain amlodipine dose. Repeat BMP and TSH      Relevant Medications   irbesartan (AVAPRO) 300 MG tablet   Other Relevant Orders   Basic metabolic panel   TSH   CBC      Follow-up: Return in about 4 weeks (around 03/28/2020) for HTN.  Wilfred Lacy, NP

## 2020-02-29 NOTE — Patient Instructions (Signed)
Increase irbesartan tom 300mg  daily Maintain amlodipine dose Go to lab for blood draw.

## 2020-03-01 ENCOUNTER — Encounter: Payer: Self-pay | Admitting: Nurse Practitioner

## 2020-03-06 ENCOUNTER — Other Ambulatory Visit: Payer: Self-pay

## 2020-03-06 ENCOUNTER — Encounter: Payer: Self-pay | Admitting: Emergency Medicine

## 2020-03-06 ENCOUNTER — Ambulatory Visit (INDEPENDENT_AMBULATORY_CARE_PROVIDER_SITE_OTHER): Payer: Medicare Other

## 2020-03-06 ENCOUNTER — Ambulatory Visit (INDEPENDENT_AMBULATORY_CARE_PROVIDER_SITE_OTHER): Payer: Medicare Other | Admitting: Emergency Medicine

## 2020-03-06 DIAGNOSIS — K219 Gastro-esophageal reflux disease without esophagitis: Secondary | ICD-10-CM

## 2020-03-06 DIAGNOSIS — J301 Allergic rhinitis due to pollen: Secondary | ICD-10-CM | POA: Diagnosis not present

## 2020-03-06 DIAGNOSIS — R55 Syncope and collapse: Secondary | ICD-10-CM | POA: Diagnosis not present

## 2020-03-06 DIAGNOSIS — J42 Unspecified chronic bronchitis: Secondary | ICD-10-CM

## 2020-03-06 LAB — CUP PACEART REMOTE DEVICE CHECK
Date Time Interrogation Session: 20211219225119
Implantable Pulse Generator Implant Date: 20190109

## 2020-03-06 NOTE — Assessment & Plan Note (Signed)
We will continue breztri 2 puffs twice a day.  Rinse and gargle after using.  We will give you a prescription for this today. Keep your albuterol available use 2 puffs if needed for shortness breath, chest tightness, wheezing. Walking oximetry today on room air.  I am happy that you want to participate in pulmonary rehab. Follow with Dr Lamonte Sakai in 4 months or sooner if you have any problems.

## 2020-03-06 NOTE — Assessment & Plan Note (Signed)
Continue your Zyrtec once daily Continue your Nasonex 2 sprays each nostril once daily. Try starting nasal saline rinses (Nettie pot) once daily to help keep congestion and mucus from irritating her throat and causing cough.

## 2020-03-06 NOTE — Patient Instructions (Signed)
We will continue breztri 2 puffs twice a day.  Rinse and gargle after using.  We will give you a prescription for this today. Keep your albuterol available use 2 puffs if needed for shortness breath, chest tightness, wheezing. Continue your Zyrtec once daily Continue your Nasonex 2 sprays each nostril once daily. Try starting nasal saline rinses (Nettie pot) once daily to help keep congestion and mucus from irritating her throat and causing cough. Continue your pantoprazole as you have been taking it Keep Pepcid available to use if needed.  Continue to be careful with eating spicy foods. Walking oximetry today on room air.  I am happy that you want to participate in pulmonary rehab. Follow with Dr Lamonte Sakai in 4 months or sooner if you have any problems.

## 2020-03-06 NOTE — Assessment & Plan Note (Signed)
Continue your pantoprazole as you have been taking it Keep Pepcid available to use if needed.  Continue to be careful with eating spicy foods.

## 2020-03-06 NOTE — Progress Notes (Signed)
Subjective:    Patient ID: Kristen Suarez, female    DOB: May 18, 1945, 74 y.o.   MRN: [id number]  HPI   ROV 01/03/20 --follow-up visit 74 year old woman who has sarcoidosis, associated COPD with severe obstruction and superimposed restriction.  She has chronic cough with allergic rhinitis, GERD.  Her cough is flaring at the beginning of the month and I asked her to start pantoprazole, continue famotidine, stop ginger and fish oil, stop lisinopril and substitute irbesartan.  She called a few days later with increased symptoms, some bronchitic symptoms and I treated her with a prednisone taper and antibiotics, hycodan.  She reports today that her breathing and cough both improved. Still a small amount of dry cough. Remains Bevespi, uses albuterol about once a day. Her nebulizer machine just broke > needs a new one.  We repeated her CT scan of the chest on 12/28/2019 which I have reviewed, shows an overall stable appearance of bronchiectatic and fibrotic change compared with 1 year ago.  Also noted were some new clustered nodularity in the anterior left upper lobe consistent with inflammatory process.  ROV 03/06/2020 >>  Kristen Suarez is 74 with a history of sarcoidosis and associated severe obstructive lung disease, also some restrictive lung disease.  She has chronic cough with allergic rhinitis and GERD.  She was seen here a month ago with flaring symptoms including dyspnea but mainly head congestion, cough.  She was changed from [place] to [place], given a prednisone taper.  She returns today reporting that the prednisone helped her head congestion. No real difference between [place]. She is interested in doing pulm rehab. She is on nasonex, on zyrtec, doing well on PPI and pepcid prn. Would be willing to try [place].   MDM: Reviewed FM notes 02/29/20, Rheumatology notes 02/02/20  Review of Systems  Constitutional: Positive for unexpected weight change. Negative for fever.  HENT: Negative  for congestion, dental problem, ear pain, nosebleeds, postnasal drip, rhinorrhea, sinus pressure, sneezing, sore throat and trouble swallowing.   Eyes: Negative for redness and itching.  Respiratory: Positive for cough and shortness of breath. Negative for chest tightness and wheezing.   Cardiovascular: Negative for palpitations and leg swelling.  Gastrointestinal: Negative for nausea and vomiting.  Genitourinary: Negative for dysuria.  Musculoskeletal: Negative for joint swelling.  Skin: Negative for rash.  Neurological: Negative for headaches.  Hematological: Does not bruise/bleed easily.  Psychiatric/Behavioral: Negative for dysphoric mood. The patient is nervous/anxious.        Objective:   Physical Exam Vitals:   03/06/20 0855  BP: (!) 142/80  Pulse: 88  SpO2: 99%   Gen: Pleasant, well-nourished, in no distress,  normal affect  ENT: No lesions,  mouth clear,  oropharynx clear, no postnasal drip  Neck: No JVD, some coarse UA noise without overt stridor  Lungs: No use of accessory muscles, some mild expiratory squeaks, right upper lobe inspiratory crackles, no wheezing  Cardiovascular: RRR, heart sounds normal, no murmur or gallops, no peripheral edema  Musculoskeletal: No deformities, no cyanosis or clubbing  Neuro: alert, awake, non focal  Skin: Warm, no lesions or rash      Assessment & Plan:  Allergic rhinitis Continue your Zyrtec once daily Continue your Nasonex 2 sprays each nostril once daily. Try starting nasal saline rinses (Nettie pot) once daily to help keep congestion and mucus from irritating her throat and causing cough.  COPD (chronic obstructive pulmonary disease) (HCC) We will continue breztri 2 puffs twice a day.  Rinse  and gargle after using.  We will give you a prescription for this today. Keep your albuterol available use 2 puffs if needed for shortness breath, chest tightness, wheezing. Walking oximetry today on room air.  I am happy that you  want to participate in pulmonary rehab. Follow with Dr Lamonte Sakai in 4 months or sooner if you have any problems.  GERD (gastroesophageal reflux disease) Continue your pantoprazole as you have been taking it Keep Pepcid available to use if needed.  Continue to be careful with eating spicy foods.  Baltazar Apo, MD, PhD 03/06/2020, 12:53 PM [facility] [phone] or if no answer [phone]

## 2020-03-14 ENCOUNTER — Other Ambulatory Visit: Payer: Self-pay

## 2020-03-14 ENCOUNTER — Ambulatory Visit
Admission: RE | Admit: 2020-03-14 | Discharge: 2020-03-14 | Disposition: A | Discharge status: Home / Self Care | Payer: Medicare Other | Source: Ambulatory Visit | Attending: Nurse Practitioner | Admitting: Nurse Practitioner

## 2020-03-14 DIAGNOSIS — Z780 Asymptomatic menopausal state: Secondary | ICD-10-CM

## 2020-03-15 ENCOUNTER — Encounter: Payer: Self-pay | Admitting: Nurse Practitioner

## 2020-03-16 ENCOUNTER — Telehealth (HOSPITAL_COMMUNITY): Payer: Self-pay

## 2020-03-16 NOTE — Progress Notes (Signed)
Carelink Summary Report / Loop Recorder 

## 2020-03-16 NOTE — Telephone Encounter (Signed)
Returned pt phone call in regards to PR, pt stated her PCP would like her to hold off on PR until after her appt on 1/13. Reschedule pt PR for 1/14 @ 9:00.

## 2020-03-21 ENCOUNTER — Other Ambulatory Visit: Payer: Self-pay | Admitting: Emergency Medicine

## 2020-03-21 DIAGNOSIS — J441 Chronic obstructive pulmonary disease with (acute) exacerbation: Secondary | ICD-10-CM

## 2020-03-21 DIAGNOSIS — R509 Fever, unspecified: Secondary | ICD-10-CM

## 2020-03-23 NOTE — Telephone Encounter (Signed)
Please advise on patient mychart message  Nearing end of samples of Breztri.  It is a Class 2 medication with my [employer].  If ordered for 3 months at a time it sometimes saves my insurance and me a bit of money.  I leave that to you. My drug store is [facility] on [address]. Thank you. Happy New Year to you and all staff.  PS Given how I have felt the last few months, I am feeling good and stable.  Something to celebrate!

## 2020-03-24 ENCOUNTER — Ambulatory Visit (HOSPITAL_COMMUNITY): Payer: Medicare Other

## 2020-03-28 ENCOUNTER — Ambulatory Visit (HOSPITAL_COMMUNITY): Payer: Medicare Other

## 2020-03-29 ENCOUNTER — Other Ambulatory Visit: Payer: Self-pay

## 2020-03-29 MED ORDER — BREZTRI AEROSPHERE 160-9-4.8 MCG/ACT IN AERO: 2.0000 | INHALATION_SPRAY | Freq: Two times a day (BID) | RESPIRATORY_TRACT | 2 refills | Status: DC

## 2020-03-29 NOTE — Telephone Encounter (Signed)
Yes please change the breztri so she gets 3 month supply  Thanks.

## 2020-03-30 ENCOUNTER — Telehealth (HOSPITAL_COMMUNITY): Payer: Self-pay

## 2020-03-30 ENCOUNTER — Telehealth: Payer: Self-pay | Admitting: Emergency Medicine

## 2020-03-30 ENCOUNTER — Encounter: Payer: Self-pay | Admitting: Nurse Practitioner

## 2020-03-30 ENCOUNTER — Ambulatory Visit (INDEPENDENT_AMBULATORY_CARE_PROVIDER_SITE_OTHER): Payer: Medicare Other | Admitting: Nurse Practitioner

## 2020-03-30 ENCOUNTER — Ambulatory Visit (HOSPITAL_COMMUNITY): Payer: Medicare Other

## 2020-03-30 DIAGNOSIS — I10 Essential (primary) hypertension: Secondary | ICD-10-CM

## 2020-03-30 MED ORDER — SPIRONOLACTONE 25 MG PO TABS: 12.5000 mg | ORAL_TABLET | ORAL | 5 refills | Status: DC

## 2020-03-30 MED ORDER — AMLODIPINE BESYLATE 10 MG PO TABS: 10.0000 mg | ORAL_TABLET | Freq: Every day | ORAL | 3 refills | Status: DC

## 2020-03-30 NOTE — Telephone Encounter (Signed)
Called pharmacy and let them know that we do not have the form for medicare for the patients Albuterol neb solution.  She will have it sent to [place] again.  Called the patient and advised that the pharmacy will fax [place] the form needed again and we will have [place] sign the form once we receive it and send it back asap.  She verbalized understanding.

## 2020-03-30 NOTE — Assessment & Plan Note (Addendum)
Persistent elevated BP with irbesartan 300mg  and amlodipine 5mg . HCTZ previously discontinued due to decline in renal function Asymptomatic. Home BP reading: 160/83, 139/77, 166/88, 160/88, 160/85, 172/86, 152/83, 146/78, 162/86, 169/93. Maintains DASH diet per patient Unable to exercise due to chronic lung disease and SOB. Risk of bradycardia with betablocker, resting HR in 70s BP Readings from Last 3 Encounters:  03/30/20 (!) 144/90  03/06/20 (!) 142/80  02/29/20 (!) 150/90   Increase amlodipine to 10mg  at bedtime Maintain irbesartan at 300mg  in AM Consider adding spironolactone if no improvement Advised to bring home machine for comparison Continue to monitor BP in AM and PM F/up in 78month

## 2020-03-30 NOTE — Progress Notes (Signed)
Subjective:  Patient ID: Kristen Suarez, female    DOB: 01-17-1946  Age: 75 y.o. MRN: [id number]  CC: Follow-up (4 week f/u on HTN )  HPI  HTN (hypertension), benign Persistent elevated BP with irbesartan 300mg  and amlodipine 5mg . HCTZ previously discontinued due to decline in renal function Asymptomatic. Home BP reading: 160/83, 139/77, 166/88, 160/88, 160/85, 172/86, 152/83, 146/78, 162/86, 169/93. Maintains DASH diet per patient Unable to exercise due to chronic lung disease and SOB. Risk of bradycardia with betablocker, resting HR in 70s BP Readings from Last 3 Encounters:  03/30/20 (!) 144/90  03/06/20 (!) 142/80  02/29/20 (!) 150/90   Increase amlodipine to 10mg  at bedtime Maintain irbesartan at 300mg  in AM Consider adding spironolactone if no improvement Advised to bring home machine for comparison Continue to monitor BP in AM and PM F/up in 30month  Reviewed past Medical, Social and Family history today.  Outpatient Medications Prior to Visit  Medication Sig Dispense Refill  . albuterol (PROVENTIL) (2.5 MG/3ML) 0.083% nebulizer solution USE 1 VIAL VIA NEBULIZER EVERY 6 HOURS 150 mL 5  . albuterol (VENTOLIN HFA) 108 (90 Base) MCG/ACT inhaler Inhale 2 puffs into the lungs every 6 (six) hours as needed for wheezing or shortness of breath. 8 g 6  . Budeson-Glycopyrrol-Formoterol (BREZTRI AEROSPHERE) 160-9-4.8 MCG/ACT AERO Inhale 2 puffs into the lungs in the morning and at bedtime. 32.1 g 2  . CALCIUM PO Take 1 tablet by mouth daily.     Marland Kitchen docusate sodium (COLACE) 100 MG capsule Take 100 mg by mouth daily.     . famotidine (PEPCID) 10 MG tablet Take 10 mg by mouth daily. PRN    . irbesartan (AVAPRO) 300 MG tablet Take 1 tablet (300 mg total) by mouth daily. 90 tablet 3  . mometasone (NASONEX) 50 MCG/ACT nasal spray Place 2 sprays into the nose daily. 1 each 3  . Multiple Vitamins-Minerals (MULTIVITAL PO) Take 1 tablet by mouth daily.     . pantoprazole (PROTONIX) 40 MG  tablet Take 1 tablet (40 mg total) by mouth daily. 30 tablet 3  . PREVIDENT 5000 BOOSTER PLUS 1.1 % PSTE SMARTSIG:To Teeth 1 to 2 Times Daily    . Probiotic Product (PROBIOTIC DAILY PO) Take 1 tablet by mouth daily.     . simvastatin (ZOCOR) 20 MG tablet Take 1 tablet (20 mg total) by mouth daily. 90 tablet 3  . Vaginal Lubricant (REPLENS VA) Place vaginally. cream    . amLODipine (NORVASC) 5 MG tablet Take 1 tablet (5 mg total) by mouth daily. 180 tablet 3  . potassium chloride SA (KLOR-CON) 20 MEQ tablet Take 1 tablet (20 mEq total) by mouth 3 (three) times a week. (Patient not taking: Reported on 03/30/2020) 12 tablet 2   No facility-administered medications prior to visit.    ROS See HPI  Objective:  BP (!) 144/90 (BP Location: Left Arm, Patient Position: Sitting, Cuff Size: Normal)   Pulse 74   Temp (!) 96.9 F (36.1 C) (Temporal)   Wt 145 lb 12.8 oz (66.1 kg)   SpO2 100%   BMI 26.67 kg/m   Physical Exam Vitals reviewed.  Cardiovascular:     Rate and Rhythm: Normal rate and regular rhythm.     Pulses: Normal pulses.     Heart sounds: Normal heart sounds.  Pulmonary:     Effort: Pulmonary effort is normal.     Breath sounds: Normal breath sounds.  Musculoskeletal:     Right lower leg: No edema.  Left lower leg: No edema.  Neurological:     Mental Status: She is alert and oriented to person, place, and time.    Assessment & Plan:  This visit occurred during the SARS-CoV-2 public health emergency.  Safety protocols were in place, including screening questions prior to the visit, additional usage of staff PPE, and extensive cleaning of exam room while observing appropriate contact time as indicated for disinfecting solutions.   [name] was seen today for follow-up.  Diagnoses and all orders for this visit:  HTN (hypertension), benign -     amLODipine (NORVASC) 10 MG tablet; Take 1 tablet (10 mg total) by mouth daily. -     Discontinue: spironolactone (ALDACTONE)  25 MG tablet; Take 0.5 tablets (12.5 mg total) by mouth every morning.    Problem List Items Addressed This Visit      Cardiovascular and Mediastinum   HTN (hypertension), benign    Persistent elevated BP with irbesartan 300mg  and amlodipine 5mg . HCTZ previously discontinued due to decline in renal function Asymptomatic. Home BP reading: 160/83, 139/77, 166/88, 160/88, 160/85, 172/86, 152/83, 146/78, 162/86, 169/93. Maintains DASH diet per patient Unable to exercise due to chronic lung disease and SOB. Risk of bradycardia with betablocker, resting HR in 70s BP Readings from Last 3 Encounters:  03/30/20 (!) 144/90  03/06/20 (!) 142/80  02/29/20 (!) 150/90   Increase amlodipine to 10mg  at bedtime Maintain irbesartan at 300mg  in AM Consider adding spironolactone if no improvement Advised to bring home machine for comparison Continue to monitor BP in AM and PM F/up in 38month      Relevant Medications   amLODipine (NORVASC) 10 MG tablet      Follow-up: Return in about 4 weeks (around 04/27/2020) for HTN (F2F, repeat BMP).  Wilfred Lacy, NP

## 2020-03-30 NOTE — Telephone Encounter (Signed)
Called pt to confirm appt with pulmonary rehab at 0900 Friday 03/31/2020. Pt confirmed appt. Instructed her to wear closed toed shoes and a mask.

## 2020-03-30 NOTE — Patient Instructions (Addendum)
Increase amlodipine to 10mg  at bedtime Maintain irbesartan at 300mg  in AM  bring home machine for comparison Continue to monitor BP in AM and PM F/up in 36month

## 2020-03-31 ENCOUNTER — Other Ambulatory Visit: Payer: Self-pay

## 2020-03-31 ENCOUNTER — Ambulatory Visit (HOSPITAL_COMMUNITY): Payer: Medicare Other

## 2020-03-31 ENCOUNTER — Encounter (HOSPITAL_COMMUNITY): Payer: Self-pay

## 2020-03-31 ENCOUNTER — Encounter (HOSPITAL_COMMUNITY)
Admission: RE | Admit: 2020-03-31 | Discharge: 2020-03-31 | Disposition: A | Discharge status: Home / Self Care | Payer: Medicare Other | Source: Ambulatory Visit | Attending: Emergency Medicine | Admitting: Emergency Medicine

## 2020-03-31 VITALS — BP 180/80 | HR 86 | Ht 63.0 in | Wt 142.9 lb

## 2020-03-31 DIAGNOSIS — D860 Sarcoidosis of lung: Secondary | ICD-10-CM | POA: Insufficient documentation

## 2020-03-31 NOTE — Progress Notes (Signed)
Pulmonary Individual Treatment Plan  Patient Details  Name: Kristen Suarez MRN: [id number] Date of Birth: [birth year] Referring Provider:   April Manson Pulmonary Rehab Walk Test from 03/31/2020 in [place]  Referring Provider Dr. 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  albuterol (PROVENTIL) (2.5 MG/3ML) 0.083% nebulizer solution, USE 1 VIAL VIA NEBULIZER EVERY 6 HOURS, Disp: 150 mL, Rfl: 5 .  albuterol (VENTOLIN HFA) 108 (90 Base) MCG/ACT inhaler, Inhale 2 puffs into the lungs every 6 (six) hours as needed for wheezing or shortness of breath., Disp: 8 g, Rfl: 6 .  amLODipine (NORVASC) 10 MG tablet, Take 1 tablet (10 mg total) by mouth daily., Disp: 90 tablet, Rfl: 3 .  Budeson-Glycopyrrol-Formoterol (BREZTRI AEROSPHERE) 160-9-4.8 MCG/ACT AERO, Inhale 2 puffs into the lungs in the morning and at bedtime., Disp: 32.1 g, Rfl: 2 .  CALCIUM PO, Take 1 tablet by mouth daily. , Disp: , Rfl:  .  docusate sodium (COLACE) 100 MG capsule, Take 100 mg by mouth daily. , Disp: , Rfl:  .  famotidine (PEPCID) 10 MG tablet, Take 10 mg by mouth daily. PRN, Disp: , Rfl:  .  irbesartan (AVAPRO) 300 MG tablet, Take 1 tablet (300 mg total) by mouth daily., Disp: 90 tablet, Rfl: 3 .  mometasone (NASONEX) 50 MCG/ACT nasal spray, Place 2 sprays into the nose daily., Disp: 1 each, Rfl: 3 .  Multiple Vitamins-Minerals (MULTIVITAL PO), Take 1 tablet by mouth daily. , Disp: , Rfl:  .  pantoprazole (PROTONIX) 40 MG tablet, Take 1 tablet (40 mg total) by mouth daily., Disp: 30 tablet, Rfl: 3 .  PREVIDENT 5000 BOOSTER PLUS 1.1 % PSTE, SMARTSIG:To Teeth 1 to 2 Times Daily, Disp: , Rfl:  .  Probiotic Product (PROBIOTIC DAILY PO), Take 1 tablet by  mouth daily. , Disp: , Rfl:  .  simvastatin (ZOCOR) 20 MG tablet, Take 1 tablet (20 mg total) by mouth daily., Disp: 90 tablet, Rfl: 3 .  Vaginal Lubricant (REPLENS VA), Place vaginally. cream, Disp: , Rfl:   Past Medical History: Past Medical History:  Diagnosis Date  . Arthritis   . Asthma   . Chickenpox   . Colon polyps 2010  . Heart murmur   . History of fainting spells of unknown cause   . Hyperlipidemia   . Hypertension   . Sarcoid 06/16/1976    Tobacco Use: Social History   Tobacco Use  Smoking Status Never Smoker  Smokeless Tobacco Never Used    Labs: Recent Review Scientist, physiological    Labs for ITP Cardiac and Pulmonary Rehab Latest Ref Rng & Units 09/08/2018   Cholestrol 0 - 200 mg/dL 191   LDLCALC 0 - 99 mg/dL 110(H)   HDL >39.00 mg/dL 53.30   Trlycerides 0.0 - 149.0 mg/dL 135.0      Capillary Blood Glucose: No results found for: GLUCAP   Pulmonary Assessment Scores:  Pulmonary Assessment Scores    Row Name 03/31/20 1104         ADL UCSD   ADL Phase Entry     SOB Score total 20           CAT Score   CAT Score 15  mMRC Score   mMRC Score 1           UCSD: Self-administered rating of dyspnea associated with activities of daily living (ADLs) 6-point scale (0 = "not at all" to 5 = "maximal or unable to do because of breathlessness")  Scoring Scores range from 0 to 120.  Minimally important difference is 5 units  CAT: CAT can identify the health impairment of COPD patients and is better correlated with disease progression.  CAT has a scoring range of zero to 40. The CAT score is classified into four groups of low (less than 10), medium (10 - 20), high (21-30) and very high (31-40) based on the impact level of disease on health status. A CAT score over 10 suggests significant symptoms.  A worsening CAT score could be explained by an exacerbation, poor medication adherence, poor inhaler technique, or progression of COPD or comorbid  conditions.  CAT MCID is 2 points  mMRC: mMRC (Modified Medical Research Council) Dyspnea Scale is used to assess the degree of baseline functional disability in patients of respiratory disease due to dyspnea. No minimal important difference is established. A decrease in score of 1 point or greater is considered a positive change.   Pulmonary Function Assessment:  Pulmonary Function Assessment - 03/31/20 1005      Breath   Shortness of Breath Yes;Limiting activity           Exercise Target Goals: Exercise Program Goal: Individual exercise prescription set using results from initial 6 min walk test and THRR while considering  patient's activity barriers and safety.   Exercise Prescription Goal: Initial exercise prescription builds to 30-45 minutes a day of aerobic activity, 2-3 days per week.  Home exercise guidelines will be given to patient during program as part of exercise prescription that the participant will acknowledge.  Activity Barriers & Risk Stratification:  Activity Barriers & Cardiac Risk Stratification - 03/31/20 1050      Activity Barriers & Cardiac Risk Stratification   Cardiac Risk Stratification Low           6 Minute Walk:  6 Minute Walk    Row Name 03/31/20 1100         6 Minute Walk   Phase Initial     Distance 1272 feet     Walk Time 6 minutes     # of Rest Breaks 0     MPH 2.41     METS 3.35     RPE 13     Perceived Dyspnea  1     VO2 Peak 11.72     Symptoms No     Resting HR 99 bpm     Resting BP 162/86     Resting Oxygen Saturation  96 %     Exercise Oxygen Saturation  during 6 min walk 92 %     Max Ex. HR 117 bpm     Max Ex. BP 190/90     2 Minute Post BP 186/92           Interval HR   1 Minute HR 106     2 Minute HR 109     3 Minute HR 113     4 Minute HR 114     5 Minute HR 117     6 Minute HR 114     2 Minute Post HR 97     Interval Heart Rate? Yes           Interval  Oxygen   Interval Oxygen? Yes     Baseline Oxygen  Saturation % 96 %     1 Minute Oxygen Saturation % 94 %     1 Minute Liters of Oxygen 0 L     2 Minute Oxygen Saturation % 94 %     2 Minute Liters of Oxygen 0 L     3 Minute Oxygen Saturation % 94 %     3 Minute Liters of Oxygen 0 L     4 Minute Oxygen Saturation % 93 %     4 Minute Liters of Oxygen 0 L     5 Minute Oxygen Saturation % 92 %     5 Minute Liters of Oxygen 0 L     6 Minute Oxygen Saturation % 93 %     6 Minute Liters of Oxygen 0 L     2 Minute Post Oxygen Saturation % 97 %     2 Minute Post Liters of Oxygen 0 L            Oxygen Initial Assessment:  Oxygen Initial Assessment - 03/31/20 1051      Home Oxygen   Home Oxygen Device None    Sleep Oxygen Prescription None    Home Exercise Oxygen Prescription None    Home Resting Oxygen Prescription None      Initial 6 min Walk   Oxygen Used None      Program Oxygen Prescription   Program Oxygen Prescription None      Intervention   Short Term Goals To learn and understand importance of monitoring SPO2 with pulse oximeter and demonstrate accurate use of the pulse oximeter.;To learn and understand importance of maintaining oxygen saturations>88%;To learn and demonstrate proper pursed lip breathing techniques or other breathing techniques.;To learn and demonstrate proper use of respiratory medications    Long  Term Goals Verbalizes importance of monitoring SPO2 with pulse oximeter and return demonstration;Maintenance of O2 saturations>88%;Exhibits proper breathing techniques, such as pursed lip breathing or other method taught during program session;Compliance with respiratory medication;Demonstrates proper use of MDI's           Oxygen Re-Evaluation:   Oxygen Discharge (Final Oxygen Re-Evaluation):   Initial Exercise Prescription:  Initial Exercise Prescription - 03/31/20 1100      Date of Initial Exercise RX and Referring Provider   Date 03/31/20    Referring Provider Dr. Lamonte Sakai    Expected Discharge  Date 06/01/20      NuStep   Level 2    SPM 80    Minutes 15      Track   Minutes 15      Prescription Details   Frequency (times per week) 2    Duration Progress to 30 minutes of continuous aerobic without signs/symptoms of physical distress      Intensity   THRR 40-80% of Max Heartrate 58-117    Ratings of Perceived Exertion 11-13    Perceived Dyspnea 0-4      Progression   Progression Continue to progress workloads to maintain intensity without signs/symptoms of physical distress.      Resistance Training   Training Prescription Yes    Weight Orange bands    Reps 10-15           Perform Capillary Blood Glucose checks as needed.  Exercise Prescription Changes:   Exercise Comments:   Exercise Goals and Review:  Exercise Goals    Row Name 03/31/20 1051  Exercise Goals   Increase Physical Activity Yes       Intervention Provide advice, education, support and counseling about physical activity/exercise needs.;Develop an individualized exercise prescription for aerobic and resistive training based on initial evaluation findings, risk stratification, comorbidities and participant's personal goals.       Expected Outcomes Short Term: Attend rehab on a regular basis to increase amount of physical activity.;Long Term: Add in home exercise to make exercise part of routine and to increase amount of physical activity.;Long Term: Exercising regularly at least 3-5 days a week.       Increase Strength and Stamina Yes       Intervention Provide advice, education, support and counseling about physical activity/exercise needs.;Develop an individualized exercise prescription for aerobic and resistive training based on initial evaluation findings, risk stratification, comorbidities and participant's personal goals.       Expected Outcomes Short Term: Increase workloads from initial exercise prescription for resistance, speed, and METs.;Short Term: Perform resistance  training exercises routinely during rehab and add in resistance training at home;Long Term: Improve cardiorespiratory fitness, muscular endurance and strength as measured by increased METs and functional capacity (6MWT)       Able to understand and use rate of perceived exertion (RPE) scale Yes       Intervention Provide education and explanation on how to use RPE scale       Expected Outcomes Short Term: Able to use RPE daily in rehab to express subjective intensity level;Long Term:  Able to use RPE to guide intensity level when exercising independently       Able to understand and use Dyspnea scale Yes       Intervention Provide education and explanation on how to use Dyspnea scale       Expected Outcomes Short Term: Able to use Dyspnea scale daily in rehab to express subjective sense of shortness of breath during exertion;Long Term: Able to use Dyspnea scale to guide intensity level when exercising independently       Knowledge and understanding of Target Heart Rate Range (THRR) Yes       Intervention Provide education and explanation of THRR including how the numbers were predicted and where they are located for reference       Expected Outcomes Short Term: Able to state/look up THRR;Long Term: Able to use THRR to govern intensity when exercising independently;Short Term: Able to use daily as guideline for intensity in rehab       Understanding of Exercise Prescription Yes       Intervention Provide education, explanation, and written materials on patient's individual exercise prescription       Expected Outcomes Short Term: Able to explain program exercise prescription;Long Term: Able to explain home exercise prescription to exercise independently              Exercise Goals Re-Evaluation :   Discharge Exercise Prescription (Final Exercise Prescription Changes):   Nutrition:  Target Goals: Understanding of nutrition guidelines, daily intake of sodium 1500mg , cholesterol 200mg ,  calories 30% from fat and 7% or less from saturated fats, daily to have 5 or more servings of fruits and vegetables.  Biometrics:  Pre Biometrics - 03/31/20 1051      Pre Biometrics   Grip Strength 17 kg            Nutrition Therapy Plan and Nutrition Goals:   Nutrition Assessments:  MEDIFICTS Score Key:  ?70 Need to make dietary changes   40-70 Heart Healthy Diet  ?  40 Therapeutic Level Cholesterol Diet   Picture Your Plate Scores:  <38 Unhealthy dietary pattern with much room for improvement.  41-50 Dietary pattern unlikely to meet recommendations for good health and room for improvement.  51-60 More healthful dietary pattern, with some room for improvement.   >60 Healthy dietary pattern, although there may be some specific behaviors that could be improved.    Nutrition Goals Re-Evaluation:   Nutrition Goals Discharge (Final Nutrition Goals Re-Evaluation):   Psychosocial: Target Goals: Acknowledge presence or absence of significant depression and/or stress, maximize coping skills, provide positive support system. Participant is able to verbalize types and ability to use techniques and skills needed for reducing stress and depression.  Initial Review & Psychosocial Screening:  Initial Psych Review & Screening - 03/31/20 1052      Screening Interventions   Expected Outcomes Long Term goal: The participant improves quality of Life and PHQ9 Scores as seen by post scores and/or verbalization of changes           Quality of Life Scores:  Scores of 19 and below usually indicate a poorer quality of life in these areas.  A difference of  2-3 points is a clinically meaningful difference.  A difference of 2-3 points in the total score of the Quality of Life Index has been associated with significant improvement in overall quality of life, self-image, physical symptoms, and general health in studies assessing change in quality of life.  PHQ-9: Recent Review  Flowsheet Data    Depression screen [facility] 2/9 03/31/2020 02/08/2020 04/21/2019 04/14/2019 01/27/2019   Decreased Interest 1 0 0 0 0   Down, Depressed, Hopeless 1 0 0 0 0   PHQ - 2 Score 2 0 0 0 0   Altered sleeping 0 - - - -   Tired, decreased energy 3 - - - -   Change in appetite 0 - - - -   Feeling bad or failure about yourself  0 - - - -   Trouble concentrating 0 - - - -   Moving slowly or fidgety/restless 0 - - - -   Suicidal thoughts 0 - - - -   PHQ-9 Score 5 - - - -   Difficult doing work/chores Not difficult at all - - - -     Interpretation of Total Score  Total Score Depression Severity:  1-4 = Minimal depression, 5-9 = Mild depression, 10-14 = Moderate depression, 15-19 = Moderately severe depression, 20-27 = Severe depression   Psychosocial Evaluation and Intervention:   Psychosocial Re-Evaluation:   Psychosocial Discharge (Final Psychosocial Re-Evaluation):   Education: Education Goals: Education classes will be provided on a weekly basis, covering required topics. Participant will state understanding/return demonstration of topics presented.  Learning Barriers/Preferences:  Learning Barriers/Preferences - 03/31/20 1008      Learning Barriers/Preferences   Learning Barriers None    Learning Preferences Skilled Demonstration;Written Material;Pictoral           Education Topics: Risk Factor Reduction:  -Group instruction that is supported by a PowerPoint presentation. Instructor discusses the definition of a risk factor, different risk factors for pulmonary disease, and how the heart and lungs work together.     Nutrition for Pulmonary Patient:  -Group instruction provided by PowerPoint slides, verbal discussion, and written materials to support subject matter. The instructor gives an explanation and review of healthy diet recommendations, which includes a discussion on weight management, recommendations for fruit and vegetable consumption, as well as protein,  fluid, caffeine,  fiber, sodium, sugar, and alcohol. Tips for eating when patients are short of breath are discussed.   Pursed Lip Breathing:  -Group instruction that is supported by demonstration and informational handouts. Instructor discusses the benefits of pursed lip and diaphragmatic breathing and detailed demonstration on how to preform both.     Oxygen Safety:  -Group instruction provided by PowerPoint, verbal discussion, and written material to support subject matter. There is an overview of "What is Oxygen" and "Why do we need it".  Instructor also reviews how to create a safe environment for oxygen use, the importance of using oxygen as prescribed, and the risks of noncompliance. There is a brief discussion on traveling with oxygen and resources the patient may utilize.   Oxygen Equipment:  -Group instruction provided by [facility] Staff utilizing handouts, written materials, and equipment demonstrations.   Signs and Symptoms:  -Group instruction provided by written material and verbal discussion to support subject matter. Warning signs and symptoms of infection, stroke, and heart attack are reviewed and when to call the physician/911 reinforced. Tips for preventing the spread of infection discussed.   Advanced Directives:  -Group instruction provided by verbal instruction and written material to support subject matter. Instructor reviews Advanced Directive laws and proper instruction for filling out document.   Pulmonary Video:  -Group video education that reviews the importance of medication and oxygen compliance, exercise, good nutrition, pulmonary hygiene, and pursed lip and diaphragmatic breathing for the pulmonary patient.   Exercise for the Pulmonary Patient:  -Group instruction that is supported by a PowerPoint presentation. Instructor discusses benefits of exercise, core components of exercise, frequency, duration, and intensity of an exercise routine, importance of  utilizing pulse oximetry during exercise, safety while exercising, and options of places to exercise outside of rehab.     Pulmonary Medications:  -Verbally interactive group education provided by instructor with focus on inhaled medications and proper administration.   Anatomy and Physiology of the Respiratory System and Intimacy:  -Group instruction provided by PowerPoint, verbal discussion, and written material to support subject matter. Instructor reviews respiratory cycle and anatomical components of the respiratory system and their functions. Instructor also reviews differences in obstructive and restrictive respiratory diseases with examples of each. Intimacy, Sex, and Sexuality differences are reviewed with a discussion on how relationships can change when diagnosed with pulmonary disease. Common sexual concerns are reviewed.   MD DAY -A group question and answer session with a medical doctor that allows participants to ask questions that relate to their pulmonary disease state.   OTHER EDUCATION -Group or individual verbal, written, or video instructions that support the educational goals of the pulmonary rehab program.   Holiday Eating Survival Tips:  -Group instruction provided by PowerPoint slides, verbal discussion, and written materials to support subject matter. The instructor gives patients tips, tricks, and techniques to help them not only survive but enjoy the holidays despite the onslaught of food that accompanies the holidays.   Knowledge Questionnaire Score:  Knowledge Questionnaire Score - 03/31/20 1104      Knowledge Questionnaire Score   Pre Score 13/18           Core Components/Risk Factors/Patient Goals at Admission:  Personal Goals and Risk Factors at Admission - 03/31/20 1053      Core Components/Risk Factors/Patient Goals on Admission   Hypertension Yes    Intervention Provide education on lifestyle modifcations including regular physical  activity/exercise, weight management, moderate sodium restriction and increased consumption of fresh fruit, vegetables, and low  fat dairy, alcohol moderation, and smoking cessation.    Expected Outcomes Long Term: Maintenance of blood pressure at goal levels.    Lipids Yes    Expected Outcomes Short Term: Participant states understanding of desired cholesterol values and is compliant with medications prescribed. Participant is following exercise prescription and nutrition guidelines.;Long Term: Cholesterol controlled with medications as prescribed, with individualized exercise RX and with personalized nutrition plan. Value goals: LDL < 70mg , HDL > 40 mg.           Core Components/Risk Factors/Patient Goals Review:    Core Components/Risk Factors/Patient Goals at Discharge (Final Review):    ITP Comments:   Comments:

## 2020-03-31 NOTE — Progress Notes (Signed)
Kristen Suarez [place] 75 y.o. female Pulmonary Rehab Orientation Note This patient who was referred to Pulmonary rehab by Dr. Lamonte Sakai with the diagnosis of Sarcoidosis of lung arrived today in Pulmonary Rehab. She arrived ambulatory with steady/normal gait. She does not carry portable oxygen. Per pt, she uses oxygen never. Color good, skin warm and dry. Patient is oriented to time and place. Patient's medical history, psychosocial health, and medications reviewed. Psychosocial assessment reveals pt lives with their spouse. Pt is currently retired from [employer]. Pt hobbies include gardening and reading. Pt reports her stress level is low. Areas of stress/anxiety include Health.  Pt does not exhibits signs of depression. PHQ2/9 score 2/5. Pt shows good  coping skills with positive outlook . Kristen Suarez was offered emotional support and reassurance. Will continue to monitor and evaluate progress toward psychosocial goal(s) of improving quality of life. Physical assessment reveals Grip strength equal, strong. Patient reports she does take medications as prescribed. Patient states she follows a Low Sodium diet. The patient has been trying to lose weight through a healthy diet and exercise program. Patient's weight will be monitored closely. Demonstration and practice of PLB using pulse oximeter. Patient able to return demonstration satisfactorily. Safety and hand hygiene in the exercise area reviewed with patient. Patient voices understanding of the information reviewed. Department expectations discussed with patient and achievable goals were set. The patient shows enthusiasm about attending the program and we look forward to working with this nice lady. The patient completed a 6 min walk test today and is scheduled to begin exercise on 04/04/20.  45 minutes was spent on a variety of activities such as assessment of the patient, obtaining baseline data including height, weight, BMI, and grip strength, verifying medical history,  allergies, and current medications, and teaching patient strategies for performing tasks with less respiratory effort with emphasis on pursed lip breathing. Rick Duff MS, ACSM CEP

## 2020-03-31 NOTE — Progress Notes (Signed)
Kristen Suarez 75 y.o. female Pulmonary Rehab Physical Assessment Note  Physical assessment reveals heart rate is normal, breath sounds moderate expiratory wheezing heard diffusely throughout both lungs, moderate inspiratory wheezing heard diffusely throughout both lungs. Grip strength equal, strong. Distal pulses palpable with no swelling notd. Patient reports she does take medications as prescribed and has a good working knowledge of her medications. Elevated BP noted, pt saw her PCP on yesterday and amlodipine was increased to 10 mg daily at night. Will continue to monitor hopefully when she she returns on Tuesday she would have taken the increased dose total of 5 times. Cherre Huger, BSN Cardiac and Training and development officer

## 2020-04-04 ENCOUNTER — Telehealth (HOSPITAL_COMMUNITY): Payer: Self-pay

## 2020-04-04 ENCOUNTER — Encounter (HOSPITAL_COMMUNITY): Admission: RE | Admit: 2020-04-04 | Payer: Medicare Other | Source: Ambulatory Visit

## 2020-04-04 ENCOUNTER — Ambulatory Visit (HOSPITAL_COMMUNITY): Payer: Medicare Other

## 2020-04-04 NOTE — Telephone Encounter (Signed)
Pt called stating she has 'cold like' symptoms and was tested for COVID and received a negative result. She was tested on Saturday and her symptoms started on Sunday. Pt reported symptoms similar to COVID. Told her we need her to get another COVID test before she can return to rehab. Pt verbalized understanding and stated she will call with test results.

## 2020-04-05 ENCOUNTER — Other Ambulatory Visit: Payer: Self-pay | Admitting: Emergency Medicine

## 2020-04-05 ENCOUNTER — Telehealth: Payer: Self-pay | Admitting: Emergency Medicine

## 2020-04-05 NOTE — Telephone Encounter (Signed)
See MyChart message

## 2020-04-05 NOTE — Telephone Encounter (Signed)
Called [facility] at [phone] and spoke with pharmacist. She stated that the RX just needed the diagnosis code, not a prior authorization. Provided the pharmacy with the dx code. She stated that she would finish processing the prescription and would call to let the patient know when they could come by to pick up their medications.

## 2020-04-06 ENCOUNTER — Ambulatory Visit (INDEPENDENT_AMBULATORY_CARE_PROVIDER_SITE_OTHER): Payer: Medicare Other

## 2020-04-06 ENCOUNTER — Ambulatory Visit (HOSPITAL_COMMUNITY): Payer: Medicare Other

## 2020-04-06 ENCOUNTER — Encounter (HOSPITAL_COMMUNITY): Payer: Medicare Other

## 2020-04-06 DIAGNOSIS — R55 Syncope and collapse: Secondary | ICD-10-CM

## 2020-04-08 LAB — CUP PACEART REMOTE DEVICE CHECK
Date Time Interrogation Session: 20220121230428
Implantable Pulse Generator Implant Date: 20190109

## 2020-04-10 ENCOUNTER — Telehealth (HOSPITAL_COMMUNITY): Payer: Self-pay | Admitting: Nurse Practitioner

## 2020-04-10 ENCOUNTER — Ambulatory Visit (HOSPITAL_COMMUNITY)
Admission: EM | Admit: 2020-04-10 | Discharge: 2020-04-10 | Disposition: A | Discharge status: Home / Self Care | Payer: Medicare Other

## 2020-04-10 ENCOUNTER — Other Ambulatory Visit: Payer: Self-pay

## 2020-04-11 ENCOUNTER — Encounter (HOSPITAL_COMMUNITY): Payer: Medicare Other

## 2020-04-11 ENCOUNTER — Ambulatory Visit (HOSPITAL_COMMUNITY): Payer: Medicare Other

## 2020-04-13 ENCOUNTER — Encounter: Payer: Self-pay | Admitting: Nurse Practitioner

## 2020-04-13 ENCOUNTER — Ambulatory Visit (HOSPITAL_COMMUNITY): Payer: Medicare Other

## 2020-04-13 ENCOUNTER — Telehealth (HOSPITAL_COMMUNITY): Payer: Self-pay | Admitting: *Deleted

## 2020-04-13 ENCOUNTER — Encounter (HOSPITAL_COMMUNITY): Payer: Medicare Other

## 2020-04-13 ENCOUNTER — Telehealth (HOSPITAL_COMMUNITY): Payer: Self-pay | Admitting: Nurse Practitioner

## 2020-04-13 NOTE — Telephone Encounter (Signed)
Called pt to check in about COVID test results. LMTCB

## 2020-04-18 ENCOUNTER — Ambulatory Visit (HOSPITAL_COMMUNITY): Payer: Medicare Other

## 2020-04-18 ENCOUNTER — Encounter (HOSPITAL_COMMUNITY): Payer: Medicare Other

## 2020-04-18 NOTE — Progress Notes (Signed)
Carelink Summary Report / Loop Recorder 

## 2020-04-19 ENCOUNTER — Ambulatory Visit: Payer: Medicare Other | Attending: Obstetrics and Gynecology | Admitting: Physical Therapy

## 2020-04-19 ENCOUNTER — Encounter: Payer: Self-pay | Admitting: Physical Therapy

## 2020-04-19 ENCOUNTER — Other Ambulatory Visit: Payer: Self-pay

## 2020-04-19 DIAGNOSIS — M6281 Muscle weakness (generalized): Secondary | ICD-10-CM | POA: Diagnosis not present

## 2020-04-19 DIAGNOSIS — R278 Other lack of coordination: Secondary | ICD-10-CM | POA: Diagnosis present

## 2020-04-19 NOTE — Therapy (Signed)
[facility] [address], [place], [place], [address] Phone: [phone]   Fax:  [phone]  Physical Therapy Evaluation  Patient Details  Name: Kristen Suarez MRN: [id number] Date of Birth: [birth year] Referring Provider (PT): Dr. Sherlene Shams   Encounter Date: 04/19/2020   PT End of Session - 04/19/20 1039    Visit Number 1    Date for PT Re-Evaluation 07/12/20    Authorization Type medicare    Authorization - Visit Number 1    Authorization - Number of Visits 10    PT Start Time 2979    PT Stop Time 1100    PT Time Calculation (min) 45 min    Activity Tolerance Patient tolerated treatment well    Behavior During Therapy [facility] for tasks assessed/performed           Past Medical History:  Diagnosis Date  . Arthritis   . Asthma   . Chickenpox   . Colon polyps 2010  . Heart murmur   . History of fainting spells of unknown cause   . Hyperlipidemia   . Hypertension   . Sarcoid 06/16/1976    Past Surgical History:  Procedure Laterality Date  . ABDOMINAL HYSTERECTOMY  1987  . LUNG BIOPSY  06/16/1976    There were no vitals filed for this visit.    Subjective Assessment - 04/19/20 1021    Subjective Patient had PT in [place] for prolapse. The pessary did not help me. [name] is the PT she saw. I feel like I could be stronger. When I am out I will wear a depends due to having larger leakage. Patient is in pulmonary program. Patient has pulmonary issues.    Patient Stated Goals I want to get to a place to maintain myself    Currently in Pain? 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 Sherlene Shams    Onset Date/Surgical Date --   2017   Prior Therapy yes in [place]      Precautions   Precautions Other (comment)     Precaution Comments pulmonary sarcoid      Restrictions   Weight Bearing Restrictions No      Balance Screen   Has the patient fallen in the past 6 months No    Has the patient had a decrease in activity level because of a fear of falling?  No    Is the patient reluctant to leave their home because of a fear of falling?  No      Home Ec[employer] residence      Prior Function   Level of Independence Independent    Leisure yoga 1 time per week on zoom      Cognition   Overall Cognitive Status Within Functional Limits for tasks assessed      Observation/Other Assessments   Focus on Therapeutic Outcomes (FOTO)  PFDI-20 is 150 pts      Posture/Postural Control   Posture/Postural Control No significant limitations      ROM / Strength   AROM / PROM / Strength AROM;PROM;Strength      PROM   Right Hip External Rotation  55    Right Hip Internal Rotation  20  Left Hip External Rotation  40    Left Hip Internal Rotation  10    Lumbar Extension decreased by 50%    Lumbar - Right Side Bend decreased by 25%    Lumbar - Left Side Bend decreased by 25%    Lumbar - Right Rotation decreased by 25%      Strength   Right Hip Extension 4/5    Right Hip ABduction 3+/5    Left Hip Extension 4/5    Left Hip ABduction 3+/5      Lumbar    Lumbar - Left Rotation decreased by 25%      Palpation   SI assessment  ASIS equal    Palpation comment when contract the abdomen the ribs stay up, minimal contractio of the lower abdominals, and will bulge the abdomen                      Objective measurements completed on examination: See above findings.     Pelvic Floor Special Questions - 04/19/20 0001    Prior Pregnancies Yes    Number of Vaginal Deliveries 3    Any difficulty with labor and deliveries Yes    Episiotomy Performed --   last child had a small tear   Currently Sexually Active Yes    Is this Painful No    Marinoff Scale no problems     Urinary Leakage Yes    Pad use 1 depends when going out    Activities that cause leaking Coughing;Sneezing;Laughing    Urinary frequency night time 2-3 times voiding    Fecal incontinence No   constipation occasionaly   Skin Integrity Intact    Prolapse Anterior Wall;Posterior Wall;Uterine    Pelvic Floor Internal Exam Patient confirms identification and approves PT to assess pelvic floor and treatment    Exam Type Vaginal    Palpation tightness on theleft levator and right    Strength weak squeeze, no lift    Strength # of seconds 1                      PT Short Term Goals - 04/19/20 1239      PT SHORT TERM GOAL #1   Title independent with initial HEP for diaphgramatic breathing and abdominal contraction    Time 12    Period Weeks    Status New    Target Date 07/12/20      PT SHORT TERM GOAL #2   Title understand ways to manage prolapse    Time 12    Period Weeks    Status New    Target Date 07/12/20             PT Long Term Goals - 04/19/20 1240      PT LONG TERM GOAL #1   Title independent with advanced HEP for core and pelvic floor strengthening    Time 12    Period Weeks    Status New    Target Date 07/12/20      PT LONG TERM GOAL #2   Title able to contract the pelvic floor >/= 3/5 strength for 6 seconds with correct abdominal contraction    Time 12    Period Weeks    Status New    Target Date 07/12/20      PT LONG TERM GOAL #3   Title understand how to deter the urge to void at night to reduce her toileting to 1-2  times per night    Time 12    Period Weeks    Status New    Target Date 07/12/20      PT LONG TERM GOAL #4   Title able to not wear a depends due to urinaty leakage decreased >/= 75% due to improve pelvic floor and abdominal contractions    Time 12    Period Weeks    Status New    Target Date 07/12/20                  Plan - 04/19/20 1039    Clinical Impression Statement Patient is a 75 year old female with  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 Patient has stage III anterior wall prolapse, Stage I posterior wall and stage I Apical prolapse. Patient has tried a pessary in the past but did not help. Patient has 3 sessions 1 on 1 physical therapy and then has done Zoom classes for pelvic floor strengthening. Patient also has Sarcodisis of the lungs. Patient has had abdominal hysterectomy in 1987. Patient reports she has urinary leakage with cough, sneeze, and laugh. She will wear a depends when going out due to leakage. Pelvic floor strength is 2/5 holding for 1 sec and does not contract the abdominals correctly. She has tightness in the iliococcygeus. When contracting her abdomen her lower rib cage will flare, minimal contraction of the lower abdomen and bulge the abdomen. Lumbar ROM is decreased by 25%. Bilateral hip external rotation and internal rotation P/ROM is limited. Bilateral hip abduction and extension decreased strength. Patient will void 3-4 times per night. Patient will benefit from skilled therapy to improve pelvic floor strength and endurance, pelvic floor coordinaiton with abdominal contraction and improve quality of life.    Personal Factors and Comorbidities Comorbidity 3+    Comorbidities Abdominal hysterectomy 1987; Sarcoid; prolapse anterior and posterior wall    Examination-Activity Limitations Continence    Stability/Clinical Decision Making Evolving/Moderate complexity    Clinical Decision Making Moderate    Rehab Potential Excellent    PT Frequency 1x / week    PT Duration 12 weeks    PT Treatment/Interventions ADLs/Self Care Home Management;Biofeedback;Cryotherapy;Electrical Stimulation;Moist Heat;Neuromuscular re-education;Therapeutic exercise;Therapeutic activities;Patient/family education;Manual techniques;Passive range of motion;Joint Manipulations;Spinal Manipulations    PT Next Visit Plan work on lower rib cage mobility,  diaphragmatic breathing, lower abdominal contraction, lay down with hips elevated, information on prolapses    Consulted and Agree with Plan of Care Patient           Patient will benefit from skilled therapeutic intervention in order to improve the following deficits and impairments:  Decreased coordination,Decreased range of motion,Increased fascial restricitons,Increased muscle spasms,Decreased activity tolerance,Decreased mobility,Decreased strength  Visit Diagnosis: Muscle weakness (generalized) - Plan: PT plan of care cert/re-cert  Other lack of coordination - Plan: PT plan of care cert/re-cert     Problem List Patient Active Problem List   Diagnosis Date Noted  . History of loop recorder 12/13/2019  . Allergic rhinitis 12/08/2019  . Cough 11/08/2019  . Post-nasal drip 11/08/2019  . Multiple joint pain 04/22/2019  . Bilateral leg edema 04/22/2019  . Vaginal atrophy 01/05/2019  . Resting tremor 09/08/2018  . Sarcoidosis 05/08/2018  . HTN (hypertension), benign 05/08/2018  . Colon polyp 05/08/2018  . Family hx of colon cancer 05/08/2018  . Cystocele, midline 05/08/2018  . Hyperlipidemia 05/08/2018  . Vasovagal syncope 05/08/2018  . Seborrheic keratosis 05/08/2018  . Pseudophakia of both eyes  05/08/2018  . GERD (gastroesophageal reflux disease) 05/08/2018  . COPD (chronic obstructive pulmonary disease) ([place]) 05/08/2018    Earlie Counts, PT 04/19/20 12:45 PM   [facility] [address], [place], [place], [address] Phone: [phone]   Fax:  [phone]  Name: Kristen Suarez MRN: [id number] Date of Birth: [birth year]

## 2020-04-20 ENCOUNTER — Ambulatory Visit (HOSPITAL_COMMUNITY): Payer: Medicare Other

## 2020-04-20 ENCOUNTER — Encounter (HOSPITAL_COMMUNITY): Payer: Medicare Other

## 2020-04-25 ENCOUNTER — Encounter (HOSPITAL_COMMUNITY)
Admission: RE | Admit: 2020-04-25 | Discharge: 2020-04-25 | Disposition: A | Discharge status: Home / Self Care | Payer: Medicare Other | Source: Ambulatory Visit | Attending: Emergency Medicine | Admitting: Emergency Medicine

## 2020-04-25 ENCOUNTER — Ambulatory Visit (HOSPITAL_COMMUNITY): Payer: Medicare Other

## 2020-04-25 ENCOUNTER — Other Ambulatory Visit: Payer: Self-pay

## 2020-04-25 VITALS — Wt 145.7 lb

## 2020-04-25 DIAGNOSIS — D860 Sarcoidosis of lung: Secondary | ICD-10-CM | POA: Diagnosis present

## 2020-04-25 NOTE — Progress Notes (Signed)
Daily Session Note  Patient Details  Name: Kristen Suarez MRN: [id number] Date of Birth: Jun 01, 1945 Referring Provider:   April Manson Pulmonary Rehab Walk Test from 03/31/2020 in [place]  Referring Provider Dr. Lamonte Sakai      Encounter Date: 04/25/2020  Check In:  Session Check In - 04/25/20 1437      Check-In   Supervising physician immediately available to respond to emergencies [facility] immediately available    Physician(s) Dr. Tana Coast    Staff Present Rosebud Poles, RN, BSN;Chondra Boyde Ysidro Evert, RN;Jessica Hassell Done, MS, ACSM-CEP, Exercise Physiologist    Virtual Visit No    Medication changes reported     No    Fall or balance concerns reported    No    Tobacco Cessation No Change    Warm-up and Cool-down Performed on first and last piece of equipment    Resistance Training Performed Yes    VAD Patient? No    PAD/SET Patient? No      Pain Assessment   Currently in Pain? No/denies    Multiple Pain Sites No           Capillary Blood Glucose: No results found for this or any previous visit (from the past 24 hour(s)).   Exercise Prescription Changes - 04/25/20 1400      Response to Exercise   Blood Pressure (Admit) 170/80    Blood Pressure (Exercise) 182/70    Blood Pressure (Exit) 160/84    Heart Rate (Admit) 88 bpm    Heart Rate (Exercise) 114 bpm    Heart Rate (Exit) 98 bpm    Oxygen Saturation (Admit) 98 %    Oxygen Saturation (Exercise) 94 %    Oxygen Saturation (Exit) 96 %    Rating of Perceived Exertion (Exercise) 14    Perceived Dyspnea (Exercise) 3    Duration Continue with 30 min of aerobic exercise without signs/symptoms of physical distress.    Intensity Other (comment)   40-80% of HRR     Progression   Progression Continue to progress workloads to maintain intensity without signs/symptoms of physical distress.      Resistance Training   Training Prescription Yes    Weight Orange Bands    Reps 10-15    Time 10 Minutes       NuStep   Level 2    SPM 80    Minutes 15    METs 1.8      Track   Laps 14    Minutes 15           Social History   Tobacco Use  Smoking Status Never Smoker  Smokeless Tobacco Never Used    Goals Met:  Exercise tolerated well No report of cardiac concerns or symptoms Strength training completed today  Goals Unmet: BP elevated today on arrival. Pt had medication adjusted per PCP last week.   Comments: Service time is from 1300 to 72    Dr. Fransico Him is Medical Director for Cardiac Rehab at [facility].

## 2020-04-25 NOTE — Progress Notes (Signed)
Pulmonary Individual Treatment Plan  Patient Details  Name: Kristen Suarez MRN: [id number] Date of Birth: 20-Jan-1946 Referring Provider:   April Manson Pulmonary Rehab Walk Test from 03/31/2020 in [place]  Referring Provider Dr. 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  albuterol (PROVENTIL) (2.5 MG/3ML) 0.083% nebulizer solution, USE 1 VIAL VIA NEBULIZER EVERY 6 HOURS, Disp: 150 mL, Rfl: 5 .  albuterol (VENTOLIN HFA) 108 (90 Base) MCG/ACT inhaler, Inhale 2 puffs into the lungs every 6 (six) hours as needed for wheezing or shortness of breath., Disp: 8 g, Rfl: 6 .  amLODipine (NORVASC) 10 MG tablet, Take 1 tablet (10 mg total) by mouth daily., Disp: 90 tablet, Rfl: 3 .  Budeson-Glycopyrrol-Formoterol (BREZTRI AEROSPHERE) 160-9-4.8 MCG/ACT AERO, Inhale 2 puffs into the lungs in the morning and at bedtime., Disp: 32.1 g, Rfl: 2 .  CALCIUM PO, Take 1 tablet by mouth daily. , Disp: , Rfl:  .  docusate sodium (COLACE) 100 MG capsule, Take 100 mg by mouth daily. , Disp: , Rfl:  .  famotidine (PEPCID) 10 MG tablet, Take 10 mg by mouth daily. PRN, Disp: , Rfl:  .  irbesartan (AVAPRO) 300 MG tablet, Take 1 tablet (300 mg total) by mouth daily., Disp: 90 tablet, Rfl: 3 .  mometasone (NASONEX) 50 MCG/ACT nasal spray, Place 2 sprays into the nose daily., Disp: 1 each, Rfl: 3 .  Multiple Vitamins-Minerals (MULTIVITAL PO), Take 1 tablet by mouth daily. , Disp: , Rfl:  .  pantoprazole (PROTONIX) 40 MG tablet, TAKE 1 TABLET(40 MG) BY MOUTH DAILY, Disp: 30 tablet, Rfl: 5 .  PREVIDENT 5000 BOOSTER PLUS 1.1 % PSTE, SMARTSIG:To Teeth 1 to 2 Times Daily, Disp: , Rfl:  .  Probiotic Product (PROBIOTIC DAILY PO), Take 1 tablet by mouth  daily. , Disp: , Rfl:  .  simvastatin (ZOCOR) 20 MG tablet, Take 1 tablet (20 mg total) by mouth daily., Disp: 90 tablet, Rfl: 3 .  Vaginal Lubricant (REPLENS VA), Place vaginally. cream, Disp: , Rfl:   Past Medical History: Past Medical History:  Diagnosis Date  . Arthritis   . Asthma   . Chickenpox   . Colon polyps 2010  . Heart murmur   . History of fainting spells of unknown cause   . Hyperlipidemia   . Hypertension   . Sarcoid 06/16/1976    Tobacco Use: Social History   Tobacco Use  Smoking Status Never Smoker  Smokeless Tobacco Never Used    Labs: Recent Review Scientist, physiological    Labs for ITP Cardiac and Pulmonary Rehab Latest Ref Rng & Units 09/08/2018   Cholestrol 0 - 200 mg/dL 191   LDLCALC 0 - 99 mg/dL 110(H)   HDL >39.00 mg/dL 53.30   Trlycerides 0.0 - 149.0 mg/dL 135.0      Capillary Blood Glucose: No results found for: GLUCAP   Pulmonary Assessment Scores:  Pulmonary Assessment Scores    Row Name 03/31/20 1104         ADL UCSD   ADL Phase Entry     SOB Score total 20           CAT Score   CAT Score 15  mMRC Score   mMRC Score 1           UCSD: Self-administered rating of dyspnea associated with activities of daily living (ADLs) 6-point scale (0 = "not at all" to 5 = "maximal or unable to do because of breathlessness")  Scoring Scores range from 0 to 120.  Minimally important difference is 5 units  CAT: CAT can identify the health impairment of COPD patients and is better correlated with disease progression.  CAT has a scoring range of zero to 40. The CAT score is classified into four groups of low (less than 10), medium (10 - 20), high (21-30) and very high (31-40) based on the impact level of disease on health status. A CAT score over 10 suggests significant symptoms.  A worsening CAT score could be explained by an exacerbation, poor medication adherence, poor inhaler technique, or progression of COPD or comorbid conditions.   CAT MCID is 2 points  mMRC: mMRC (Modified Medical Research Council) Dyspnea Scale is used to assess the degree of baseline functional disability in patients of respiratory disease due to dyspnea. No minimal important difference is established. A decrease in score of 1 point or greater is considered a positive change.   Pulmonary Function Assessment:  Pulmonary Function Assessment - 03/31/20 1005      Breath   Shortness of Breath Yes;Limiting activity           Exercise Target Goals: Exercise Program Goal: Individual exercise prescription set using results from initial 6 min walk test and THRR while considering  patient's activity barriers and safety.   Exercise Prescription Goal: Initial exercise prescription builds to 30-45 minutes a day of aerobic activity, 2-3 days per week.  Home exercise guidelines will be given to patient during program as part of exercise prescription that the participant will acknowledge.  Activity Barriers & Risk Stratification:  Activity Barriers & Cardiac Risk Stratification - 03/31/20 1050      Activity Barriers & Cardiac Risk Stratification   Cardiac Risk Stratification Low           6 Minute Walk:  6 Minute Walk    Row Name 03/31/20 1100         6 Minute Walk   Phase Initial     Distance 1272 feet     Walk Time 6 minutes     # of Rest Breaks 0     MPH 2.41     METS 3.35     RPE 13     Perceived Dyspnea  1     VO2 Peak 11.72     Symptoms No     Resting HR 99 bpm     Resting BP 162/86     Resting Oxygen Saturation  96 %     Exercise Oxygen Saturation  during 6 min walk 92 %     Max Ex. HR 117 bpm     Max Ex. BP 190/90     2 Minute Post BP 186/92           Interval HR   1 Minute HR 106     2 Minute HR 109     3 Minute HR 113     4 Minute HR 114     5 Minute HR 117     6 Minute HR 114     2 Minute Post HR 97     Interval Heart Rate? Yes           Interval  Oxygen   Interval Oxygen? Yes     Baseline Oxygen Saturation  % 96 %     1 Minute Oxygen Saturation % 94 %     1 Minute Liters of Oxygen 0 L     2 Minute Oxygen Saturation % 94 %     2 Minute Liters of Oxygen 0 L     3 Minute Oxygen Saturation % 94 %     3 Minute Liters of Oxygen 0 L     4 Minute Oxygen Saturation % 93 %     4 Minute Liters of Oxygen 0 L     5 Minute Oxygen Saturation % 92 %     5 Minute Liters of Oxygen 0 L     6 Minute Oxygen Saturation % 93 %     6 Minute Liters of Oxygen 0 L     2 Minute Post Oxygen Saturation % 97 %     2 Minute Post Liters of Oxygen 0 L            Oxygen Initial Assessment:  Oxygen Initial Assessment - 03/31/20 1051      Home Oxygen   Home Oxygen Device None    Sleep Oxygen Prescription None    Home Exercise Oxygen Prescription None    Home Resting Oxygen Prescription None      Initial 6 min Walk   Oxygen Used None      Program Oxygen Prescription   Program Oxygen Prescription None      Intervention   Short Term Goals To learn and understand importance of monitoring SPO2 with pulse oximeter and demonstrate accurate use of the pulse oximeter.;To learn and understand importance of maintaining oxygen saturations>88%;To learn and demonstrate proper pursed lip breathing techniques or other breathing techniques.;To learn and demonstrate proper use of respiratory medications    Long  Term Goals Verbalizes importance of monitoring SPO2 with pulse oximeter and return demonstration;Maintenance of O2 saturations>88%;Exhibits proper breathing techniques, such as pursed lip breathing or other method taught during program session;Compliance with respiratory medication;Demonstrates proper use of MDI's           Oxygen Re-Evaluation:  Oxygen Re-Evaluation    Row Name 04/25/20 0707             Program Oxygen Prescription   Program Oxygen Prescription None               Home Oxygen   Home Oxygen Device None       Sleep Oxygen Prescription None       Home Exercise Oxygen Prescription None        Home Resting Oxygen Prescription None               Goals/Expected Outcomes   Short Term Goals To learn and understand importance of monitoring SPO2 with pulse oximeter and demonstrate accurate use of the pulse oximeter.;To learn and understand importance of maintaining oxygen saturations>88%;To learn and demonstrate proper pursed lip breathing techniques or other breathing techniques.;To learn and demonstrate proper use of respiratory medications       Long  Term Goals Verbalizes importance of monitoring SPO2 with pulse oximeter and return demonstration;Maintenance of O2 saturations>88%;Exhibits proper breathing techniques, such as pursed lip breathing or other method taught during program session;Compliance with respiratory medication;Demonstrates proper use of MDI's       Goals/Expected Outcomes compliance and understanding of oxygen saturation and pursed lip breathing  Oxygen Discharge (Final Oxygen Re-Evaluation):  Oxygen Re-Evaluation - 04/25/20 0707      Program Oxygen Prescription   Program Oxygen Prescription None      Home Oxygen   Home Oxygen Device None    Sleep Oxygen Prescription None    Home Exercise Oxygen Prescription None    Home Resting Oxygen Prescription None      Goals/Expected Outcomes   Short Term Goals To learn and understand importance of monitoring SPO2 with pulse oximeter and demonstrate accurate use of the pulse oximeter.;To learn and understand importance of maintaining oxygen saturations>88%;To learn and demonstrate proper pursed lip breathing techniques or other breathing techniques.;To learn and demonstrate proper use of respiratory medications    Long  Term Goals Verbalizes importance of monitoring SPO2 with pulse oximeter and return demonstration;Maintenance of O2 saturations>88%;Exhibits proper breathing techniques, such as pursed lip breathing or other method taught during program session;Compliance with respiratory medication;Demonstrates  proper use of MDI's    Goals/Expected Outcomes compliance and understanding of oxygen saturation and pursed lip breathing           Initial Exercise Prescription:  Initial Exercise Prescription - 03/31/20 1100      Date of Initial Exercise RX and Referring Provider   Date 03/31/20    Referring Provider Dr. Lamonte Sakai    Expected Discharge Date 06/01/20      NuStep   Level 2    SPM 80    Minutes 15      Track   Minutes 15      Prescription Details   Frequency (times per week) 2    Duration Progress to 30 minutes of continuous aerobic without signs/symptoms of physical distress      Intensity   THRR 40-80% of Max Heartrate 58-117    Ratings of Perceived Exertion 11-13    Perceived Dyspnea 0-4      Progression   Progression Continue to progress workloads to maintain intensity without signs/symptoms of physical distress.      Resistance Training   Training Prescription Yes    Weight Orange bands    Reps 10-15           Perform Capillary Blood Glucose checks as needed.  Exercise Prescription Changes:   Exercise Comments:   Exercise Goals and Review:  Exercise Goals    Row Name 03/31/20 1051             Exercise Goals   Increase Physical Activity Yes       Intervention Provide advice, education, support and counseling about physical activity/exercise needs.;Develop an individualized exercise prescription for aerobic and resistive training based on initial evaluation findings, risk stratification, comorbidities and participant's personal goals.       Expected Outcomes Short Term: Attend rehab on a regular basis to increase amount of physical activity.;Long Term: Add in home exercise to make exercise part of routine and to increase amount of physical activity.;Long Term: Exercising regularly at least 3-5 days a week.       Increase Strength and Stamina Yes       Intervention Provide advice, education, support and counseling about physical activity/exercise  needs.;Develop an individualized exercise prescription for aerobic and resistive training based on initial evaluation findings, risk stratification, comorbidities and participant's personal goals.       Expected Outcomes Short Term: Increase workloads from initial exercise prescription for resistance, speed, and METs.;Short Term: Perform resistance training exercises routinely during rehab and add in resistance training at home;Long Term: Improve cardiorespiratory fitness,  muscular endurance and strength as measured by increased METs and functional capacity (6MWT)       Able to understand and use rate of perceived exertion (RPE) scale Yes       Intervention Provide education and explanation on how to use RPE scale       Expected Outcomes Short Term: Able to use RPE daily in rehab to express subjective intensity level;Long Term:  Able to use RPE to guide intensity level when exercising independently       Able to understand and use Dyspnea scale Yes       Intervention Provide education and explanation on how to use Dyspnea scale       Expected Outcomes Short Term: Able to use Dyspnea scale daily in rehab to express subjective sense of shortness of breath during exertion;Long Term: Able to use Dyspnea scale to guide intensity level when exercising independently       Knowledge and understanding of Target Heart Rate Range (THRR) Yes       Intervention Provide education and explanation of THRR including how the numbers were predicted and where they are located for reference       Expected Outcomes Short Term: Able to state/look up THRR;Long Term: Able to use THRR to govern intensity when exercising independently;Short Term: Able to use daily as guideline for intensity in rehab       Understanding of Exercise Prescription Yes       Intervention Provide education, explanation, and written materials on patient's individual exercise prescription       Expected Outcomes Short Term: Able to explain program  exercise prescription;Long Term: Able to explain home exercise prescription to exercise independently              Exercise Goals Re-Evaluation :  Exercise Goals Re-Evaluation    Row Name 04/25/20 0708             Exercise Goal Re-Evaluation   Exercise Goals Review Increase Physical Activity;Increase Strength and Stamina;Able to understand and use rate of perceived exertion (RPE) scale;Able to understand and use Dyspnea scale;Knowledge and understanding of Target Heart Rate Range (THRR);Understanding of Exercise Prescription       Comments Patient has not had her first exercise session due to being out with COVID. Patient is set to return today for 1st exercise session. Will continue to monitor and progress as she is able.       Expected Outcomes Through exercise at rehab and home the patient will decrease shortness of breath with daily activities and feel confident in carrying out an exercise regimn at home.              Discharge Exercise Prescription (Final Exercise Prescription Changes):   Nutrition:  Target Goals: Understanding of nutrition guidelines, daily intake of sodium 1500mg , cholesterol 200mg , calories 30% from fat and 7% or less from saturated fats, daily to have 5 or more servings of fruits and vegetables.  Biometrics:  Pre Biometrics - 03/31/20 1051      Pre Biometrics   Grip Strength 17 kg            Nutrition Therapy Plan and Nutrition Goals:   Nutrition Assessments:  MEDIFICTS Score Key:  ?70 Need to make dietary changes   40-70 Heart Healthy Diet  ? 40 Therapeutic Level Cholesterol Diet   Picture Your Plate Scores:  <17 Unhealthy dietary pattern with much room for improvement.  41-50 Dietary pattern unlikely to meet recommendations for good health and  room for improvement.  51-60 More healthful dietary pattern, with some room for improvement.   >60 Healthy dietary pattern, although there may be some specific behaviors that could be  improved.    Nutrition Goals Re-Evaluation:   Nutrition Goals Discharge (Final Nutrition Goals Re-Evaluation):   Psychosocial: Target Goals: Acknowledge presence or absence of significant depression and/or stress, maximize coping skills, provide positive support system. Participant is able to verbalize types and ability to use techniques and skills needed for reducing stress and depression.  Initial Review & Psychosocial Screening:  Initial Psych Review & Screening - 03/31/20 1052      Screening Interventions   Expected Outcomes Long Term goal: The participant improves quality of Life and PHQ9 Scores as seen by post scores and/or verbalization of changes           Quality of Life Scores:  Scores of 19 and below usually indicate a poorer quality of life in these areas.  A difference of  2-3 points is a clinically meaningful difference.  A difference of 2-3 points in the total score of the Quality of Life Index has been associated with significant improvement in overall quality of life, self-image, physical symptoms, and general health in studies assessing change in quality of life.  PHQ-9: Recent Review Flowsheet Data    Depression screen [facility] 2/9 03/31/2020 02/08/2020 04/21/2019 04/14/2019 01/27/2019   Decreased Interest 1 0 0 0 0   Down, Depressed, Hopeless 1 0 0 0 0   PHQ - 2 Score 2 0 0 0 0   Altered sleeping 0 - - - -   Tired, decreased energy 3 - - - -   Change in appetite 0 - - - -   Feeling bad or failure about yourself  0 - - - -   Trouble concentrating 0 - - - -   Moving slowly or fidgety/restless 0 - - - -   Suicidal thoughts 0 - - - -   PHQ-9 Score 5 - - - -   Difficult doing work/chores Not difficult at all - - - -     Interpretation of Total Score  Total Score Depression Severity:  1-4 = Minimal depression, 5-9 = Mild depression, 10-14 = Moderate depression, 15-19 = Moderately severe depression, 20-27 = Severe depression   Psychosocial Evaluation and  Intervention:   Psychosocial Re-Evaluation:  Psychosocial Re-Evaluation    Row Name 04/24/20 1316             Psychosocial Re-Evaluation   Current issues with None Identified       Comments Ailis was exposed to covid and had symptoms even though she tested negative and will start exercising in pulmonary rehab tomorrow, 04/25/2020.  I am unable to evaluate her psychosocial status due to her absent from the program.  I will re-evaluate when she returns.       Expected Outcomes For Verbie to be free of psychosocial concerns while participating in pulmonary rehab.       Interventions Encouraged to attend Pulmonary Rehabilitation for the exercise;Relaxation education;Stress management education       Continue Psychosocial Services  No Follow up required              Psychosocial Discharge (Final Psychosocial Re-Evaluation):  Psychosocial Re-Evaluation - 04/24/20 1316      Psychosocial Re-Evaluation   Current issues with None Identified    Comments Prisila was exposed to covid and had symptoms even though she tested negative and will start exercising  in pulmonary rehab tomorrow, 04/25/2020.  I am unable to evaluate her psychosocial status due to her absent from the program.  I will re-evaluate when she returns.    Expected Outcomes For Davion to be free of psychosocial concerns while participating in pulmonary rehab.    Interventions Encouraged to attend Pulmonary Rehabilitation for the exercise;Relaxation education;Stress management education    Continue Psychosocial Services  No Follow up required           Education: Education Goals: Education classes will be provided on a weekly basis, covering required topics. Participant will state understanding/return demonstration of topics presented.  Learning Barriers/Preferences:  Learning Barriers/Preferences - 03/31/20 1008      Learning Barriers/Preferences   Learning Barriers None    Learning Preferences Skilled  Demonstration;Written Material;Pictoral           Education Topics: Risk Factor Reduction:  -Group instruction that is supported by a PowerPoint presentation. Instructor discusses the definition of a risk factor, different risk factors for pulmonary disease, and how the heart and lungs work together.     Nutrition for Pulmonary Patient:  -Group instruction provided by PowerPoint slides, verbal discussion, and written materials to support subject matter. The instructor gives an explanation and review of healthy diet recommendations, which includes a discussion on weight management, recommendations for fruit and vegetable consumption, as well as protein, fluid, caffeine, fiber, sodium, sugar, and alcohol. Tips for eating when patients are short of breath are discussed.   Pursed Lip Breathing:  -Group instruction that is supported by demonstration and informational handouts. Instructor discusses the benefits of pursed lip and diaphragmatic breathing and detailed demonstration on how to preform both.     Oxygen Safety:  -Group instruction provided by PowerPoint, verbal discussion, and written material to support subject matter. There is an overview of "What is Oxygen" and "Why do we need it".  Instructor also reviews how to create a safe environment for oxygen use, the importance of using oxygen as prescribed, and the risks of noncompliance. There is a brief discussion on traveling with oxygen and resources the patient may utilize.   Oxygen Equipment:  -Group instruction provided by [facility] Staff utilizing handouts, written materials, and equipment demonstrations.   Signs and Symptoms:  -Group instruction provided by written material and verbal discussion to support subject matter. Warning signs and symptoms of infection, stroke, and heart attack are reviewed and when to call the physician/911 reinforced. Tips for preventing the spread of infection discussed.   Advanced Directives:   -Group instruction provided by verbal instruction and written material to support subject matter. Instructor reviews Advanced Directive laws and proper instruction for filling out document.   Pulmonary Video:  -Group video education that reviews the importance of medication and oxygen compliance, exercise, good nutrition, pulmonary hygiene, and pursed lip and diaphragmatic breathing for the pulmonary patient.   Exercise for the Pulmonary Patient:  -Group instruction that is supported by a PowerPoint presentation. Instructor discusses benefits of exercise, core components of exercise, frequency, duration, and intensity of an exercise routine, importance of utilizing pulse oximetry during exercise, safety while exercising, and options of places to exercise outside of rehab.     Pulmonary Medications:  -Verbally interactive group education provided by instructor with focus on inhaled medications and proper administration.   Anatomy and Physiology of the Respiratory System and Intimacy:  -Group instruction provided by PowerPoint, verbal discussion, and written material to support subject matter. Instructor reviews respiratory cycle and anatomical components of the respiratory  system and their functions. Instructor also reviews differences in obstructive and restrictive respiratory diseases with examples of each. Intimacy, Sex, and Sexuality differences are reviewed with a discussion on how relationships can change when diagnosed with pulmonary disease. Common sexual concerns are reviewed.   MD DAY -A group question and answer session with a medical doctor that allows participants to ask questions that relate to their pulmonary disease state.   OTHER EDUCATION -Group or individual verbal, written, or video instructions that support the educational goals of the pulmonary rehab program.   Holiday Eating Survival Tips:  -Group instruction provided by PowerPoint slides, verbal discussion, and  written materials to support subject matter. The instructor gives patients tips, tricks, and techniques to help them not only survive but enjoy the holidays despite the onslaught of food that accompanies the holidays.   Knowledge Questionnaire Score:  Knowledge Questionnaire Score - 03/31/20 1104      Knowledge Questionnaire Score   Pre Score 13/18           Core Components/Risk Factors/Patient Goals at Admission:  Personal Goals and Risk Factors at Admission - 03/31/20 1053      Core Components/Risk Factors/Patient Goals on Admission   Hypertension Yes    Intervention Provide education on lifestyle modifcations including regular physical activity/exercise, weight management, moderate sodium restriction and increased consumption of fresh fruit, vegetables, and low fat dairy, alcohol moderation, and smoking cessation.    Expected Outcomes Long Term: Maintenance of blood pressure at goal levels.    Lipids Yes    Expected Outcomes Short Term: Participant states understanding of desired cholesterol values and is compliant with medications prescribed. Participant is following exercise prescription and nutrition guidelines.;Long Term: Cholesterol controlled with medications as prescribed, with individualized exercise RX and with personalized nutrition plan. Value goals: LDL < 70mg , HDL > 40 mg.           Core Components/Risk Factors/Patient Goals Review:   Goals and Risk Factor Review    Row Name 04/24/20 1321             Core Components/Risk Factors/Patient Goals Review   Personal Goals Review Develop more efficient breathing techniques such as purse lipped breathing and diaphragmatic breathing and practicing self-pacing with activity.;Increase knowledge of respiratory medications and ability to use respiratory devices properly.;Improve shortness of breath with ADL's       Review Kiandra will start pulmonary rehab tomorrow, 04/25/2020.       Expected Outcomes See admission goals.               Core Components/Risk Factors/Patient Goals at Discharge (Final Review):   Goals and Risk Factor Review - 04/24/20 1321      Core Components/Risk Factors/Patient Goals Review   Personal Goals Review Develop more efficient breathing techniques such as purse lipped breathing and diaphragmatic breathing and practicing self-pacing with activity.;Increase knowledge of respiratory medications and ability to use respiratory devices properly.;Improve shortness of breath with ADL's    Review Nioma will start pulmonary rehab tomorrow, 04/25/2020.    Expected Outcomes See admission goals.           ITP Comments:   Comments: ITP REVIEW Pt is making expected progress toward pulmonary rehab goals after completing 0 sessions. Recommend continued exercise, life style modification, education, and utilization of breathing techniques to increase stamina and strength and decrease shortness of breath with exertion.

## 2020-04-27 ENCOUNTER — Encounter (HOSPITAL_COMMUNITY)
Admission: RE | Admit: 2020-04-27 | Discharge: 2020-04-27 | Disposition: A | Discharge status: Home / Self Care | Payer: Medicare Other | Source: Ambulatory Visit | Attending: Emergency Medicine | Admitting: Emergency Medicine

## 2020-04-27 ENCOUNTER — Ambulatory Visit (HOSPITAL_COMMUNITY): Payer: Medicare Other

## 2020-04-27 ENCOUNTER — Other Ambulatory Visit: Payer: Self-pay

## 2020-04-27 DIAGNOSIS — D860 Sarcoidosis of lung: Secondary | ICD-10-CM | POA: Diagnosis not present

## 2020-04-27 NOTE — Progress Notes (Signed)
Daily Session Note  Patient Details  Name: Kristen Suarez MRN: [id number] Date of Birth: 11/03/1945 Referring Provider:   Flowsheet Row Pulmonary Rehab Walk Test from 03/31/2020 in [facility]  Referring Provider Dr. Byrum      Encounter Date: 04/27/2020  Check In:  Session Check In - 04/27/20 1413      Check-In   Supervising physician immediately available to respond to emergencies [facility] immediately available    Physician(s) Dr. Rai    [facility]    Staff Present Joan Behrens, RN, BSN;Lisa Hughes, RN;Jessica Martin, MS, ACSM-CEP, Exercise Physiologist    Virtual Visit No    Medication changes reported     No    Fall or balance concerns reported    No    Tobacco Cessation No Change    Warm-up and Cool-down Performed on first and last piece of equipment    Resistance Training Performed Yes    VAD Patient? No    PAD/SET Patient? No      Pain Assessment   Currently in Pain? No/denies    Multiple Pain Sites No           Capillary Blood Glucose: No results found for this or any previous visit (from the past 24 hour(s)).    Social History   Tobacco Use  Smoking Status Never Smoker  Smokeless Tobacco Never Used    Goals Met:  Proper associated with RPD/PD & O2 Sat Exercise tolerated well No report of cardiac concerns or symptoms Strength training completed today  Goals Unmet:  Not Applicable  Comments: Service time is from 1315 to 1432    Dr. Traci Turner is Medical Director for Cardiac Rehab at [facility]. 

## 2020-04-28 ENCOUNTER — Ambulatory Visit (INDEPENDENT_AMBULATORY_CARE_PROVIDER_SITE_OTHER): Payer: Medicare Other | Admitting: Nurse Practitioner

## 2020-04-28 ENCOUNTER — Encounter: Payer: Self-pay | Admitting: Nurse Practitioner

## 2020-04-28 VITALS — BP 134/64 | HR 83 | Temp 97.4°F | Ht 62.0 in | Wt 146.6 lb

## 2020-04-28 DIAGNOSIS — I10 Essential (primary) hypertension: Secondary | ICD-10-CM | POA: Diagnosis not present

## 2020-04-28 NOTE — Progress Notes (Signed)
Subjective:  Patient ID: Kristen Suarez, female    DOB: 02/10/1946  Age: 75 y.o. MRN: [id number]  CC: Follow-up (4 week f/u on HTN. Pt checks BP at home and has log with her today. )  HPI  HTN (hypertension), benign Home BP machine: 145/68, HR of 73 BP Readings from Last 3 Encounters:  04/28/20 134/64  03/31/20 (!) 180/80  03/30/20 (!) 144/90   Maintain current medications and BP check AM and PM F/up in 64months  BP Readings from Last 3 Encounters:  04/28/20 134/64  03/31/20 (!) 180/80  03/30/20 (!) 144/90    Reviewed past Medical, Social and Family history today.  Outpatient Medications Prior to Visit  Medication Sig Dispense Refill  . albuterol (PROVENTIL) (2.5 MG/3ML) 0.083% nebulizer solution USE 1 VIAL VIA NEBULIZER EVERY 6 HOURS 150 mL 5  . albuterol (VENTOLIN HFA) 108 (90 Base) MCG/ACT inhaler Inhale 2 puffs into the lungs every 6 (six) hours as needed for wheezing or shortness of breath. 8 g 6  . amLODipine (NORVASC) 10 MG tablet Take 1 tablet (10 mg total) by mouth daily. 90 tablet 3  . Budeson-Glycopyrrol-Formoterol (BREZTRI AEROSPHERE) 160-9-4.8 MCG/ACT AERO Inhale 2 puffs into the lungs in the morning and at bedtime. 32.1 g 2  . CALCIUM PO Take 1 tablet by mouth daily.     Marland Kitchen docusate sodium (COLACE) 100 MG capsule Take 100 mg by mouth daily.     . famotidine (PEPCID) 10 MG tablet Take 10 mg by mouth daily. PRN    . irbesartan (AVAPRO) 300 MG tablet Take 1 tablet (300 mg total) by mouth daily. 90 tablet 3  . mometasone (NASONEX) 50 MCG/ACT nasal spray Place 2 sprays into the nose daily. 1 each 3  . Multiple Vitamins-Minerals (MULTIVITAL PO) Take 1 tablet by mouth daily.     . pantoprazole (PROTONIX) 40 MG tablet TAKE 1 TABLET(40 MG) BY MOUTH DAILY 30 tablet 5  . PREVIDENT 5000 BOOSTER PLUS 1.1 % PSTE SMARTSIG:To Teeth 1 to 2 Times Daily    . Probiotic Product (PROBIOTIC DAILY PO) Take 1 tablet by mouth daily.     . simvastatin (ZOCOR) 20 MG tablet Take 1 tablet  (20 mg total) by mouth daily. 90 tablet 3  . Vaginal Lubricant (REPLENS VA) Place vaginally. cream     No facility-administered medications prior to visit.    ROS See HPI  Objective:  BP 134/64 (BP Location: Left Arm, Patient Position: Sitting, Cuff Size: Normal)   Pulse 83   Temp (!) 97.4 F (36.3 C) (Temporal)   Ht 5\' 2"  (1.575 m)   Wt 146 lb 9.6 oz (66.5 kg)   SpO2 98%   BMI 26.81 kg/m   Physical Exam Cardiovascular:     Rate and Rhythm: Normal rate and regular rhythm.     Pulses: Normal pulses.     Heart sounds: Normal heart sounds.  Pulmonary:     Effort: Pulmonary effort is normal.     Breath sounds: Normal breath sounds.  Musculoskeletal:     Right lower leg: No edema.     Left lower leg: No edema.  Neurological:     Mental Status: She is alert and oriented to person, place, and time.  Psychiatric:        Mood and Affect: Mood normal.        Behavior: Behavior normal.        Thought Content: Thought content normal.    Assessment & Plan:  This visit occurred  during the SARS-CoV-2 public health emergency.  Safety protocols were in place, including screening questions prior to the visit, additional usage of staff PPE, and extensive cleaning of exam room while observing appropriate contact time as indicated for disinfecting solutions.   Kristen Suarez was seen today for follow-up.  Diagnoses and all orders for this visit:  HTN (hypertension), benign   Problem List Items Addressed This Visit      Cardiovascular and Mediastinum   HTN (hypertension), benign - Primary    Home BP machine: 145/68, HR of 73 BP Readings from Last 3 Encounters:  04/28/20 134/64  03/31/20 (!) 180/80  03/30/20 (!) 144/90   Maintain current medications and BP check AM and PM F/up in 33months          Follow-up: Return in about 3 months (around 07/26/2020) for HTN and , hyperlipidemia (fasting).  Wilfred Lacy, NP

## 2020-04-28 NOTE — Assessment & Plan Note (Signed)
Home BP machine: 145/68, HR of 73 BP Readings from Last 3 Encounters:  04/28/20 134/64  03/31/20 (!) 180/80  03/30/20 (!) 144/90   Maintain current medications and BP check AM and PM F/up in 64months

## 2020-05-02 ENCOUNTER — Other Ambulatory Visit: Payer: Self-pay

## 2020-05-02 ENCOUNTER — Ambulatory Visit (HOSPITAL_COMMUNITY): Payer: Medicare Other

## 2020-05-02 ENCOUNTER — Encounter (HOSPITAL_COMMUNITY)
Admission: RE | Admit: 2020-05-02 | Discharge: 2020-05-02 | Disposition: A | Discharge status: Home / Self Care | Payer: Medicare Other | Source: Ambulatory Visit | Attending: Emergency Medicine | Admitting: Emergency Medicine

## 2020-05-02 DIAGNOSIS — D860 Sarcoidosis of lung: Secondary | ICD-10-CM

## 2020-05-02 NOTE — Progress Notes (Signed)
Daily Session Note  Patient Details  Name: Kristen Suarez MRN: [id number] Date of Birth: 12/27/1945 Referring Provider:   Flowsheet Row Pulmonary Rehab Walk Test from 03/31/2020 in [facility]  Referring Provider Dr. Byrum      Encounter Date: 05/02/2020  Check In:  Session Check In - 05/02/20 1434      Check-In   Supervising physician immediately available to respond to emergencies [facility] immediately available    Physician(s) Dr. Rai    [facility]    Staff Present Joan Behrens, RN, BSN;Lisa Hughes, RN;Jessica Martin, MS, ACSM-CEP, Exercise Physiologist    Virtual Visit No    Medication changes reported     No    Fall or balance concerns reported    No    Tobacco Cessation No Change    Warm-up and Cool-down Performed on first and last piece of equipment    Resistance Training Performed Yes    VAD Patient? No    PAD/SET Patient? No      Pain Assessment   Currently in Pain? No/denies    Multiple Pain Sites No           Capillary Blood Glucose: No results found for this or any previous visit (from the past 24 hour(s)).    Social History   Tobacco Use  Smoking Status Never Smoker  Smokeless Tobacco Never Used    Goals Met:  Proper associated with RPD/PD & O2 Sat Independence with exercise equipment Exercise tolerated well Strength training completed today  Goals Unmet:  Not Applicable  Comments: Service time is from 1315 to 1423    Dr. Traci Turner is Medical Director for Cardiac Rehab at [facility]. 

## 2020-05-04 ENCOUNTER — Other Ambulatory Visit: Payer: Self-pay

## 2020-05-04 ENCOUNTER — Encounter (HOSPITAL_COMMUNITY)
Admission: RE | Admit: 2020-05-04 | Discharge: 2020-05-04 | Disposition: A | Discharge status: Home / Self Care | Payer: Medicare Other | Source: Ambulatory Visit | Attending: Emergency Medicine | Admitting: Emergency Medicine

## 2020-05-04 ENCOUNTER — Ambulatory Visit (HOSPITAL_COMMUNITY): Payer: Medicare Other

## 2020-05-04 DIAGNOSIS — D860 Sarcoidosis of lung: Secondary | ICD-10-CM

## 2020-05-04 NOTE — Progress Notes (Signed)
Kristen Suarez 75 y.o. female Nutrition Note  Visit Diagnosis: Sarcoidosis of lung [facility])   Past Medical History:  Diagnosis Date  . Arthritis   . Asthma   . Chickenpox   . Colon polyps 2010  . Heart murmur   . History of fainting spells of unknown cause   . Hyperlipidemia   . Hypertension   . Sarcoid 06/16/1976     Medications reviewed.   Current Outpatient Medications:  .  albuterol (PROVENTIL) (2.5 MG/3ML) 0.083% nebulizer solution, USE 1 VIAL VIA NEBULIZER EVERY 6 HOURS, Disp: 150 mL, Rfl: 5 .  albuterol (VENTOLIN HFA) 108 (90 Base) MCG/ACT inhaler, Inhale 2 puffs into the lungs every 6 (six) hours as needed for wheezing or shortness of breath., Disp: 8 g, Rfl: 6 .  amLODipine (NORVASC) 10 MG tablet, Take 1 tablet (10 mg total) by mouth daily., Disp: 90 tablet, Rfl: 3 .  Budeson-Glycopyrrol-Formoterol (BREZTRI AEROSPHERE) 160-9-4.8 MCG/ACT AERO, Inhale 2 puffs into the lungs in the morning and at bedtime., Disp: 32.1 g, Rfl: 2 .  CALCIUM PO, Take 1 tablet by mouth daily. , Disp: , Rfl:  .  docusate sodium (COLACE) 100 MG capsule, Take 100 mg by mouth daily. , Disp: , Rfl:  .  famotidine (PEPCID) 10 MG tablet, Take 10 mg by mouth daily. PRN, Disp: , Rfl:  .  irbesartan (AVAPRO) 300 MG tablet, Take 1 tablet (300 mg total) by mouth daily., Disp: 90 tablet, Rfl: 3 .  mometasone (NASONEX) 50 MCG/ACT nasal spray, Place 2 sprays into the nose daily., Disp: 1 each, Rfl: 3 .  Multiple Vitamins-Minerals (MULTIVITAL PO), Take 1 tablet by mouth daily. , Disp: , Rfl:  .  pantoprazole (PROTONIX) 40 MG tablet, TAKE 1 TABLET(40 MG) BY MOUTH DAILY, Disp: 30 tablet, Rfl: 5 .  PREVIDENT 5000 BOOSTER PLUS 1.1 % PSTE, SMARTSIG:To Teeth 1 to 2 Times Daily, Disp: , Rfl:  .  Probiotic Product (PROBIOTIC DAILY PO), Take 1 tablet by mouth daily. , Disp: , Rfl:  .  simvastatin (ZOCOR) 20 MG tablet, Take 1 tablet (20 mg total) by mouth daily., Disp: 90 tablet, Rfl: 3 .  Vaginal Lubricant (REPLENS VA),  Place vaginally. cream, Disp: , Rfl:    Ht Readings from Last 1 Encounters:  04/28/20 5\' 2"  (1.575 m)     Wt Readings from Last 3 Encounters:  04/28/20 146 lb 9.6 oz (66.5 kg)  04/25/20 145 lb 11.6 oz (66.1 kg)  03/31/20 142 lb 13.7 oz (64.8 kg)     There is no height or weight on file to calculate BMI.   Social History   Tobacco Use  Smoking Status Never Smoker  Smokeless Tobacco Never Used     Lab Results  Component Value Date   CHOL 191 09/08/2018   Lab Results  Component Value Date   HDL 53.30 09/08/2018   Lab Results  Component Value Date   LDLCALC 110 (H) 09/08/2018   Lab Results  Component Value Date   TRIG 135.0 09/08/2018     No results found for: HGBA1C   CBG (last 3)  No results for input(s): GLUCAP in the last 72 hours.   Nutrition Note  Spoke with pt. Nutrition Plan and Nutrition Survey goals reviewed with pt. Pt is following a Heart Healthy diet.  Pt would like to lose a few lbs by incorporating more activity. She used to be more active prior to covid19 pandemic.    Per discussion, pt does not use canned/convenience foods  often. Pt does not add salt to food. Pt does not eat out frequently.  Her appetite is good. Very little alcohol.   Diet recall: Breakfast: Old fashioned oats with dried cherries, honey, pecans Lunch: Salad or leftover homemade soup Dinner: sheet pan veggies (peppers, onions, tomatoes, and broc)  Pt expressed understanding of the information reviewed.    Nutrition Diagnosis ? Food-and nutrition-related knowledge deficit related to lack of exposure to information as related to diagnosis of: ? COPD, HTN, HLD  Nutrition Intervention ? Pt's individual nutrition plan reviewed with pt. ? Benefits of adopting Heart Healthy diet discussed when Picture Your Plate reviewed.   ? Continue client-centered nutrition education by RD, as part of interdisciplinary care.  Goal(s)  ? Pt to continue to  build a healthy plate including  vegetables, fruits, whole grains, and low-fat dairy products in a heart healthy meal plan.  Plan:   Will provide client-centered nutrition education as part of interdisciplinary care  Monitor and evaluate progress toward nutrition goal with team.   Michaele Offer, MS, RDN, LDN

## 2020-05-04 NOTE — Progress Notes (Signed)
Daily Session Note  Patient Details  Name: Kristen Suarez MRN: [id number] Date of Birth: Aug 04, 1945 Referring Provider:   April Manson Pulmonary Rehab Walk Test from 03/31/2020 in [place]  Referring Provider Dr. Lamonte Sakai      Encounter Date: 05/04/2020  Check In:  Session Check In - 05/04/20 1354      Check-In   Supervising physician immediately available to respond to emergencies [facility] immediately available    Physician(s) Dr. Sloan Leiter    [facility]    Staff Present Rosebud Poles, RN, BSN;Bayan Hedstrom Ysidro Evert, RN;Jessica Hassell Done, MS, ACSM-CEP, Exercise Physiologist    Virtual Visit No    Medication changes reported     No    Fall or balance concerns reported    No    Tobacco Cessation No Change    Warm-up and Cool-down Performed on first and last piece of equipment    Resistance Training Performed Yes    VAD Patient? No    PAD/SET Patient? No      Pain Assessment   Currently in Pain? No/denies    Multiple Pain Sites No           Capillary Blood Glucose: No results found for this or any previous visit (from the past 24 hour(s)).    Social History   Tobacco Use  Smoking Status Never Smoker  Smokeless Tobacco Never Used    Goals Met:  Exercise tolerated well No report of cardiac concerns or symptoms Strength training completed today  Goals Unmet:  Not Applicable  Comments: Service time is from 1325 to 1445    Dr. Fransico Him is Medical Director for Cardiac Rehab at [facility].

## 2020-05-08 ENCOUNTER — Ambulatory Visit (INDEPENDENT_AMBULATORY_CARE_PROVIDER_SITE_OTHER): Payer: Medicare Other

## 2020-05-08 DIAGNOSIS — R55 Syncope and collapse: Secondary | ICD-10-CM

## 2020-05-09 ENCOUNTER — Ambulatory Visit (HOSPITAL_COMMUNITY): Payer: Medicare Other

## 2020-05-09 ENCOUNTER — Other Ambulatory Visit: Payer: Self-pay

## 2020-05-09 ENCOUNTER — Encounter (HOSPITAL_COMMUNITY)
Admission: RE | Admit: 2020-05-09 | Discharge: 2020-05-09 | Disposition: A | Discharge status: Home / Self Care | Payer: Medicare Other | Source: Ambulatory Visit | Attending: Emergency Medicine | Admitting: Emergency Medicine

## 2020-05-09 VITALS — Wt 145.7 lb

## 2020-05-09 DIAGNOSIS — D860 Sarcoidosis of lung: Secondary | ICD-10-CM

## 2020-05-09 NOTE — Progress Notes (Signed)
Daily Session Note  Patient Details  Name: Kristen Suarez MRN: [id number] Date of Birth: 1945/11/17 Referring Provider:   April Manson Pulmonary Rehab Walk Test from 03/31/2020 in [place]  Referring Provider Dr. Lamonte Sakai      Encounter Date: 05/09/2020  Check In:  Session Check In - 05/09/20 1446      Check-In   Supervising physician immediately available to respond to emergencies [facility] immediately available   Simultaneous filing. User may not have seen previous data.   Physician(s) Dr. Sloan Leiter   Simultaneous filing. User may not have seen previous data.   [facility]   Simultaneous filing. User may not have seen previous data.   Staff Present Rodney Langton, RN;Joan Leonia Reeves, RN, Isaac Laud, MS, ACSM-CEP, Exercise Physiologist   Simultaneous filing. User may not have seen previous data.   Virtual Visit No   Simultaneous filing. User may not have seen previous data.   Medication changes reported     No   Simultaneous filing. User may not have seen previous data.   Fall or balance concerns reported    No   Simultaneous filing. User may not have seen previous data.   Tobacco Cessation No Change   Simultaneous filing. User may not have seen previous data.   Warm-up and Cool-down Performed on first and last piece of equipment   Simultaneous filing. User may not have seen previous data.   Resistance Training Performed Yes   Simultaneous filing. User may not have seen previous data.   VAD Patient? No   Simultaneous filing. User may not have seen previous data.   PAD/SET Patient? No   Simultaneous filing. User may not have seen previous data.     Pain Assessment   Currently in Pain? No/denies   Simultaneous filing. User may not have seen previous data.   Pain Score 0-No pain    Multiple Pain Sites No   Simultaneous filing. User may not have seen previous data.          Capillary Blood Glucose: No results found  for this or any previous visit (from the past 24 hour(s)).   Exercise Prescription Changes - 05/09/20 1400      Response to Exercise   Blood Pressure (Admit) 148/81    Blood Pressure (Exercise) 142/72    Blood Pressure (Exit) 146/66    Heart Rate (Admit) 84 bpm    Heart Rate (Exercise) 104 bpm    Heart Rate (Exit) 92 bpm    Oxygen Saturation (Admit) 98 %    Oxygen Saturation (Exercise) 90 %    Oxygen Saturation (Exit) 97 %    Rating of Perceived Exertion (Exercise) 13    Perceived Dyspnea (Exercise) 1    Duration Continue with 30 min of aerobic exercise without signs/symptoms of physical distress.    Intensity THRR unchanged      Progression   Progression Continue to progress workloads to maintain intensity without signs/symptoms of physical distress.      Resistance Training   Training Prescription Yes    Weight orange ands    Reps 10-15    Time 10 Minutes      NuStep   Level 2    SPM 80    Minutes 15    METs 1.9      Track   Laps 15    Minutes 15           Social History   Tobacco Use  Smoking Status Never Smoker  Smokeless Tobacco Never Used    Goals Met:  Exercise tolerated well No report of cardiac concerns or symptoms Strength training completed today  Goals Unmet:  Not Applicable  Comments: Service time is from 1315 to 1425    Dr. Fransico Him is Medical Director for Cardiac Rehab at [facility].

## 2020-05-11 ENCOUNTER — Other Ambulatory Visit: Payer: Self-pay

## 2020-05-11 ENCOUNTER — Ambulatory Visit (HOSPITAL_COMMUNITY): Payer: Medicare Other

## 2020-05-11 ENCOUNTER — Encounter (HOSPITAL_COMMUNITY)
Admission: RE | Admit: 2020-05-11 | Discharge: 2020-05-11 | Disposition: A | Discharge status: Home / Self Care | Payer: Medicare Other | Source: Ambulatory Visit | Attending: Emergency Medicine | Admitting: Emergency Medicine

## 2020-05-11 DIAGNOSIS — D860 Sarcoidosis of lung: Secondary | ICD-10-CM | POA: Diagnosis not present

## 2020-05-11 LAB — CUP PACEART REMOTE DEVICE CHECK
Date Time Interrogation Session: 20220223230556
Implantable Pulse Generator Implant Date: 20190109

## 2020-05-11 NOTE — Progress Notes (Signed)
Daily Session Note  Patient Details  Name: Kristen Suarez MRN: [id number] Date of Birth: [birth year] Referring Provider:   April Manson Pulmonary Rehab Walk Test from 03/31/2020 in [place]  Referring Provider Dr. Lamonte Sakai      Encounter Date: 05/11/2020  Check In:  Session Check In - 05/11/20 1409      Check-In   Supervising physician immediately available to respond to emergencies [facility] immediately available    Physician(s) Dr. Tyrell Antonio    [facility]    Staff Present Rosebud Poles, RN, BSN;Lisa Ysidro Evert, RN;Jessica Hassell Done, MS, ACSM-CEP, Exercise Physiologist    Virtual Visit No    Medication changes reported     No    Fall or balance concerns reported    No    Tobacco Cessation No Change    Warm-up and Cool-down Performed on first and last piece of equipment    Resistance Training Performed Yes    VAD Patient? No    PAD/SET Patient? No      Pain Assessment   Currently in Pain? No/denies    Pain Score 0-No pain    Multiple Pain Sites No           Capillary Blood Glucose: No results found for this or any previous visit (from the past 24 hour(s)).    Social History   Tobacco Use  Smoking Status Never Smoker  Smokeless Tobacco Never Used    Goals Met:  Proper associated with RPD/PD & O2 Sat Independence with exercise equipment Exercise tolerated well Strength training completed today  Goals Unmet:  Not Applicable  Comments: Service time is from 1315 to 1415    Dr. Fransico Him is Medical Director for Cardiac Rehab at [facility].

## 2020-05-11 NOTE — Progress Notes (Signed)
Carelink Summary Report / Loop Recorder 

## 2020-05-16 ENCOUNTER — Other Ambulatory Visit: Payer: Self-pay

## 2020-05-16 ENCOUNTER — Encounter (HOSPITAL_COMMUNITY)
Admission: RE | Admit: 2020-05-16 | Discharge: 2020-05-16 | Disposition: A | Discharge status: Home / Self Care | Payer: Medicare Other | Source: Ambulatory Visit | Attending: Emergency Medicine | Admitting: Emergency Medicine

## 2020-05-16 ENCOUNTER — Ambulatory Visit (HOSPITAL_COMMUNITY): Payer: Medicare Other

## 2020-05-16 DIAGNOSIS — D860 Sarcoidosis of lung: Secondary | ICD-10-CM | POA: Insufficient documentation

## 2020-05-16 NOTE — Progress Notes (Signed)
Pulmonary Individual Treatment Plan  Patient Details  Name: Kristen Suarez MRN: [id number] Date of Birth: 20-Jan-1946 Referring Provider:   April Manson Pulmonary Rehab Walk Test from 03/31/2020 in [place]  Referring Provider Dr. 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  albuterol (PROVENTIL) (2.5 MG/3ML) 0.083% nebulizer solution, USE 1 VIAL VIA NEBULIZER EVERY 6 HOURS, Disp: 150 mL, Rfl: 5 .  albuterol (VENTOLIN HFA) 108 (90 Base) MCG/ACT inhaler, Inhale 2 puffs into the lungs every 6 (six) hours as needed for wheezing or shortness of breath., Disp: 8 g, Rfl: 6 .  amLODipine (NORVASC) 10 MG tablet, Take 1 tablet (10 mg total) by mouth daily., Disp: 90 tablet, Rfl: 3 .  Budeson-Glycopyrrol-Formoterol (BREZTRI AEROSPHERE) 160-9-4.8 MCG/ACT AERO, Inhale 2 puffs into the lungs in the morning and at bedtime., Disp: 32.1 g, Rfl: 2 .  CALCIUM PO, Take 1 tablet by mouth daily. , Disp: , Rfl:  .  docusate sodium (COLACE) 100 MG capsule, Take 100 mg by mouth daily. , Disp: , Rfl:  .  famotidine (PEPCID) 10 MG tablet, Take 10 mg by mouth daily. PRN, Disp: , Rfl:  .  irbesartan (AVAPRO) 300 MG tablet, Take 1 tablet (300 mg total) by mouth daily., Disp: 90 tablet, Rfl: 3 .  mometasone (NASONEX) 50 MCG/ACT nasal spray, Place 2 sprays into the nose daily., Disp: 1 each, Rfl: 3 .  Multiple Vitamins-Minerals (MULTIVITAL PO), Take 1 tablet by mouth daily. , Disp: , Rfl:  .  pantoprazole (PROTONIX) 40 MG tablet, TAKE 1 TABLET(40 MG) BY MOUTH DAILY, Disp: 30 tablet, Rfl: 5 .  PREVIDENT 5000 BOOSTER PLUS 1.1 % PSTE, SMARTSIG:To Teeth 1 to 2 Times Daily, Disp: , Rfl:  .  Probiotic Product (PROBIOTIC DAILY PO), Take 1 tablet by mouth  daily. , Disp: , Rfl:  .  simvastatin (ZOCOR) 20 MG tablet, Take 1 tablet (20 mg total) by mouth daily., Disp: 90 tablet, Rfl: 3 .  Vaginal Lubricant (REPLENS VA), Place vaginally. cream, Disp: , Rfl:   Past Medical History: Past Medical History:  Diagnosis Date  . Arthritis   . Asthma   . Chickenpox   . Colon polyps 2010  . Heart murmur   . History of fainting spells of unknown cause   . Hyperlipidemia   . Hypertension   . Sarcoid 06/16/1976    Tobacco Use: Social History   Tobacco Use  Smoking Status Never Smoker  Smokeless Tobacco Never Used    Labs: Recent Review Scientist, physiological    Labs for ITP Cardiac and Pulmonary Rehab Latest Ref Rng & Units 09/08/2018   Cholestrol 0 - 200 mg/dL 191   LDLCALC 0 - 99 mg/dL 110(H)   HDL >39.00 mg/dL 53.30   Trlycerides 0.0 - 149.0 mg/dL 135.0      Capillary Blood Glucose: No results found for: GLUCAP   Pulmonary Assessment Scores:  Pulmonary Assessment Scores    Row Name 03/31/20 1104         ADL UCSD   ADL Phase Entry     SOB Score total 20           CAT Score   CAT Score 15  mMRC Score   mMRC Score 1           UCSD: Self-administered rating of dyspnea associated with activities of daily living (ADLs) 6-point scale (0 = "not at all" to 5 = "maximal or unable to do because of breathlessness")  Scoring Scores range from 0 to 120.  Minimally important difference is 5 units  CAT: CAT can identify the health impairment of COPD patients and is better correlated with disease progression.  CAT has a scoring range of zero to 40. The CAT score is classified into four groups of low (less than 10), medium (10 - 20), high (21-30) and very high (31-40) based on the impact level of disease on health status. A CAT score over 10 suggests significant symptoms.  A worsening CAT score could be explained by an exacerbation, poor medication adherence, poor inhaler technique, or progression of COPD or comorbid conditions.   CAT MCID is 2 points  mMRC: mMRC (Modified Medical Research Council) Dyspnea Scale is used to assess the degree of baseline functional disability in patients of respiratory disease due to dyspnea. No minimal important difference is established. A decrease in score of 1 point or greater is considered a positive change.   Pulmonary Function Assessment:  Pulmonary Function Assessment - 03/31/20 1005      Breath   Shortness of Breath Yes;Limiting activity           Exercise Target Goals: Exercise Program Goal: Individual exercise prescription set using results from initial 6 min walk test and THRR while considering  patient's activity barriers and safety.   Exercise Prescription Goal: Initial exercise prescription builds to 30-45 minutes a day of aerobic activity, 2-3 days per week.  Home exercise guidelines will be given to patient during program as part of exercise prescription that the participant will acknowledge.  Activity Barriers & Risk Stratification:  Activity Barriers & Cardiac Risk Stratification - 03/31/20 1050      Activity Barriers & Cardiac Risk Stratification   Cardiac Risk Stratification Low           6 Minute Walk:  6 Minute Walk    Row Name 03/31/20 1100         6 Minute Walk   Phase Initial     Distance 1272 feet     Walk Time 6 minutes     # of Rest Breaks 0     MPH 2.41     METS 3.35     RPE 13     Perceived Dyspnea  1     VO2 Peak 11.72     Symptoms No     Resting HR 99 bpm     Resting BP 162/86     Resting Oxygen Saturation  96 %     Exercise Oxygen Saturation  during 6 min walk 92 %     Max Ex. HR 117 bpm     Max Ex. BP 190/90     2 Minute Post BP 186/92           Interval HR   1 Minute HR 106     2 Minute HR 109     3 Minute HR 113     4 Minute HR 114     5 Minute HR 117     6 Minute HR 114     2 Minute Post HR 97     Interval Heart Rate? Yes           Interval  Oxygen   Interval Oxygen? Yes     Baseline Oxygen Saturation  % 96 %     1 Minute Oxygen Saturation % 94 %     1 Minute Liters of Oxygen 0 L     2 Minute Oxygen Saturation % 94 %     2 Minute Liters of Oxygen 0 L     3 Minute Oxygen Saturation % 94 %     3 Minute Liters of Oxygen 0 L     4 Minute Oxygen Saturation % 93 %     4 Minute Liters of Oxygen 0 L     5 Minute Oxygen Saturation % 92 %     5 Minute Liters of Oxygen 0 L     6 Minute Oxygen Saturation % 93 %     6 Minute Liters of Oxygen 0 L     2 Minute Post Oxygen Saturation % 97 %     2 Minute Post Liters of Oxygen 0 L            Oxygen Initial Assessment:  Oxygen Initial Assessment - 03/31/20 1051      Home Oxygen   Home Oxygen Device None    Sleep Oxygen Prescription None    Home Exercise Oxygen Prescription None    Home Resting Oxygen Prescription None      Initial 6 min Walk   Oxygen Used None      Program Oxygen Prescription   Program Oxygen Prescription None      Intervention   Short Term Goals To learn and understand importance of monitoring SPO2 with pulse oximeter and demonstrate accurate use of the pulse oximeter.;To learn and understand importance of maintaining oxygen saturations>88%;To learn and demonstrate proper pursed lip breathing techniques or other breathing techniques.;To learn and demonstrate proper use of respiratory medications    Long  Term Goals Verbalizes importance of monitoring SPO2 with pulse oximeter and return demonstration;Maintenance of O2 saturations>88%;Exhibits proper breathing techniques, such as pursed lip breathing or other method taught during program session;Compliance with respiratory medication;Demonstrates proper use of MDI's           Oxygen Re-Evaluation:  Oxygen Re-Evaluation    Row Name 04/25/20 0707 05/15/20 1334           Program Oxygen Prescription   Program Oxygen Prescription None None             Home Oxygen   Home Oxygen Device None None      Sleep Oxygen Prescription None None      Home Exercise Oxygen  Prescription None None      Home Resting Oxygen Prescription None None             Goals/Expected Outcomes   Short Term Goals To learn and understand importance of monitoring SPO2 with pulse oximeter and demonstrate accurate use of the pulse oximeter.;To learn and understand importance of maintaining oxygen saturations>88%;To learn and demonstrate proper pursed lip breathing techniques or other breathing techniques.;To learn and demonstrate proper use of respiratory medications To learn and understand importance of monitoring SPO2 with pulse oximeter and demonstrate accurate use of the pulse oximeter.;To learn and understand importance of maintaining oxygen saturations>88%;To learn and demonstrate proper pursed lip breathing techniques or other breathing techniques.;To learn and demonstrate proper use of respiratory medications      Long  Term Goals Verbalizes importance of monitoring SPO2 with pulse oximeter and return demonstration;Maintenance of O2 saturations>88%;Exhibits proper breathing techniques, such as pursed lip  breathing or other method taught during program session;Compliance with respiratory medication;Demonstrates proper use of MDI's Verbalizes importance of monitoring SPO2 with pulse oximeter and return demonstration;Maintenance of O2 saturations>88%;Exhibits proper breathing techniques, such as pursed lip breathing or other method taught during program session;Compliance with respiratory medication;Demonstrates proper use of MDI's      Goals/Expected Outcomes compliance and understanding of oxygen saturation and pursed lip breathing compliance and understanding of oxygen saturation and pursed lip breathing             Oxygen Discharge (Final Oxygen Re-Evaluation):  Oxygen Re-Evaluation - 05/15/20 1334      Program Oxygen Prescription   Program Oxygen Prescription None      Home Oxygen   Home Oxygen Device None    Sleep Oxygen Prescription None    Home Exercise Oxygen  Prescription None    Home Resting Oxygen Prescription None      Goals/Expected Outcomes   Short Term Goals To learn and understand importance of monitoring SPO2 with pulse oximeter and demonstrate accurate use of the pulse oximeter.;To learn and understand importance of maintaining oxygen saturations>88%;To learn and demonstrate proper pursed lip breathing techniques or other breathing techniques.;To learn and demonstrate proper use of respiratory medications    Long  Term Goals Verbalizes importance of monitoring SPO2 with pulse oximeter and return demonstration;Maintenance of O2 saturations>88%;Exhibits proper breathing techniques, such as pursed lip breathing or other method taught during program session;Compliance with respiratory medication;Demonstrates proper use of MDI's    Goals/Expected Outcomes compliance and understanding of oxygen saturation and pursed lip breathing           Initial Exercise Prescription:  Initial Exercise Prescription - 03/31/20 1100      Date of Initial Exercise RX and Referring Provider   Date 03/31/20    Referring Provider Dr. Lamonte Sakai    Expected Discharge Date 06/01/20      NuStep   Level 2    SPM 80    Minutes 15      Track   Minutes 15      Prescription Details   Frequency (times per week) 2    Duration Progress to 30 minutes of continuous aerobic without signs/symptoms of physical distress      Intensity   THRR 40-80% of Max Heartrate 58-117    Ratings of Perceived Exertion 11-13    Perceived Dyspnea 0-4      Progression   Progression Continue to progress workloads to maintain intensity without signs/symptoms of physical distress.      Resistance Training   Training Prescription Yes    Weight Orange bands    Reps 10-15           Perform Capillary Blood Glucose checks as needed.  Exercise Prescription Changes:  Exercise Prescription Changes    Row Name 04/25/20 1400 05/09/20 1400 05/15/20 1300         Response to Exercise    Blood Pressure (Admit) 170/80 148/81 -     Blood Pressure (Exercise) 182/70 142/72 -     Blood Pressure (Exit) 160/84 146/66 -     Heart Rate (Admit) 88 bpm 84 bpm -     Heart Rate (Exercise) 114 bpm 104 bpm -     Heart Rate (Exit) 98 bpm 92 bpm -     Oxygen Saturation (Admit) 98 % 98 % -     Oxygen Saturation (Exercise) 94 % 90 % -     Oxygen Saturation (Exit) 96 % 97 % -  Rating of Perceived Exertion (Exercise) 14 13 -     Perceived Dyspnea (Exercise) 3 1 -     Duration Continue with 30 min of aerobic exercise without signs/symptoms of physical distress. Continue with 30 min of aerobic exercise without signs/symptoms of physical distress. -     Intensity Other (comment)  40-80% of HRR THRR unchanged -           Progression   Progression Continue to progress workloads to maintain intensity without signs/symptoms of physical distress. Continue to progress workloads to maintain intensity without signs/symptoms of physical distress. -     Average METs - - 2.74           Resistance Training   Training Prescription Yes Yes -     Weight Orange Bands orange ands -     Reps 10-15 10-15 -     Time 10 Minutes 10 Minutes -           NuStep   Level 2 2 -     SPM 80 80 -     Minutes 15 15 -     METs 1.8 1.9 -           Track   Laps 14 15 -     Minutes 15 15 -            Exercise Comments:  Exercise Comments    Row Name 04/25/20 1457           Exercise Comments Patient completed first day of exercise and tolerated well with no complaints or concerns. She was able to do 15 minutes on the Nustep and 15 minutes walking the track. While walking the track she did have to stop a couple of times due to shortness of breath. She was able to do the resistance bands with no mobility issues.              Exercise Goals and Review:  Exercise Goals    Row Name 03/31/20 1051             Exercise Goals   Increase Physical Activity Yes       Intervention Provide advice,  education, support and counseling about physical activity/exercise needs.;Develop an individualized exercise prescription for aerobic and resistive training based on initial evaluation findings, risk stratification, comorbidities and participant's personal goals.       Expected Outcomes Short Term: Attend rehab on a regular basis to increase amount of physical activity.;Long Term: Add in home exercise to make exercise part of routine and to increase amount of physical activity.;Long Term: Exercising regularly at least 3-5 days a week.       Increase Strength and Stamina Yes       Intervention Provide advice, education, support and counseling about physical activity/exercise needs.;Develop an individualized exercise prescription for aerobic and resistive training based on initial evaluation findings, risk stratification, comorbidities and participant's personal goals.       Expected Outcomes Short Term: Increase workloads from initial exercise prescription for resistance, speed, and METs.;Short Term: Perform resistance training exercises routinely during rehab and add in resistance training at home;Long Term: Improve cardiorespiratory fitness, muscular endurance and strength as measured by increased METs and functional capacity (6MWT)       Able to understand and use rate of perceived exertion (RPE) scale Yes       Intervention Provide education and explanation on how to use RPE scale       Expected Outcomes Short Term:  Able to use RPE daily in rehab to express subjective intensity level;Long Term:  Able to use RPE to guide intensity level when exercising independently       Able to understand and use Dyspnea scale Yes       Intervention Provide education and explanation on how to use Dyspnea scale       Expected Outcomes Short Term: Able to use Dyspnea scale daily in rehab to express subjective sense of shortness of breath during exertion;Long Term: Able to use Dyspnea scale to guide intensity level when  exercising independently       Knowledge and understanding of Target Heart Rate Range (THRR) Yes       Intervention Provide education and explanation of THRR including how the numbers were predicted and where they are located for reference       Expected Outcomes Short Term: Able to state/look up THRR;Long Term: Able to use THRR to govern intensity when exercising independently;Short Term: Able to use daily as guideline for intensity in rehab       Understanding of Exercise Prescription Yes       Intervention Provide education, explanation, and written materials on patient's individual exercise prescription       Expected Outcomes Short Term: Able to explain program exercise prescription;Long Term: Able to explain home exercise prescription to exercise independently              Exercise Goals Re-Evaluation :  Exercise Goals Re-Evaluation    Row Name 04/25/20 0708 05/15/20 1330           Exercise Goal Re-Evaluation   Exercise Goals Review Increase Physical Activity;Increase Strength and Stamina;Able to understand and use rate of perceived exertion (RPE) scale;Able to understand and use Dyspnea scale;Knowledge and understanding of Target Heart Rate Range (THRR);Understanding of Exercise Prescription Increase Physical Activity;Increase Strength and Stamina;Able to understand and use rate of perceived exertion (RPE) scale;Able to understand and use Dyspnea scale;Knowledge and understanding of Target Heart Rate Range (THRR);Understanding of Exercise Prescription      Comments Patient has not had her first exercise session due to being out with COVID. Patient is set to return today for 1st exercise session. Will continue to monitor and progress as she is able. Irva has completed 6 exercise sessions and has tolerated exercise well so far. She has made some progression with MET increases. She is becoming independent with all exercises and is able to do everything. She is currently exercising at 2.1  METS on the Nustep and 2.74 METS walking the track. Will continue to monitor and progress as she is able.      Expected Outcomes Through exercise at rehab and home the patient will decrease shortness of breath with daily activities and feel confident in carrying out an exercise regimn at home. Through exercise at rehab and home the patient will decrease shortness of breath with daily activities and feel confident in carrying out an exercise regimn at home.             Discharge Exercise Prescription (Final Exercise Prescription Changes):  Exercise Prescription Changes - 05/15/20 1300      Progression   Average METs 2.74           Nutrition:  Target Goals: Understanding of nutrition guidelines, daily intake of sodium <1530m, cholesterol <2075m calories 30% from fat and 7% or less from saturated fats, daily to have 5 or more servings of fruits and vegetables.  Biometrics:  Pre Biometrics - 03/31/20 1051  Pre Biometrics   Grip Strength 17 kg            Nutrition Therapy Plan and Nutrition Goals:  Nutrition Therapy & Goals - 05/04/20 1427      Nutrition Therapy   Diet Generally healthy      Personal Nutrition Goals   Nutrition Goal Pt to continue to  build a healthy plate including vegetables, fruits, whole grains, and low-fat dairy products in a heart healthy meal plan.      Intervention Plan   Intervention Prescribe, educate and counsel regarding individualized specific dietary modifications aiming towards targeted core components such as weight, hypertension, lipid management, diabetes, heart failure and other comorbidities.    Expected Outcomes Short Term Goal: Understand basic principles of dietary content, such as calories, fat, sodium, cholesterol and nutrients.           Nutrition Assessments:  MEDIFICTS Score Key:  ?70 Need to make dietary changes   40-70 Heart Healthy Diet  ? 40 Therapeutic Level Cholesterol Diet  Flowsheet Row PULMONARY REHAB  OTHER RESPIRATORY from 05/04/2020 in [place]  Picture Your Plate Total Score on Admission 79     Picture Your Plate Scores:  <53 Unhealthy dietary pattern with much room for improvement.  41-50 Dietary pattern unlikely to meet recommendations for good health and room for improvement.  51-60 More healthful dietary pattern, with some room for improvement.   >60 Healthy dietary pattern, although there may be some specific behaviors that could be improved.    Nutrition Goals Re-Evaluation:  Nutrition Goals Re-Evaluation    [place] Name 05/04/20 1427 05/11/20 6144           Goals   Current Weight 146 lb (66.2 kg) 145 lb 11.6 oz (66.1 kg)      Expected Outcome Maintain a healthy diet -             Nutrition Goals Discharge (Final Nutrition Goals Re-Evaluation):  Nutrition Goals Re-Evaluation - 05/11/20 0907      Goals   Current Weight 145 lb 11.6 oz (66.1 kg)           Psychosocial: Target Goals: Acknowledge presence or absence of significant depression and/or stress, maximize coping skills, provide positive support system. Participant is able to verbalize types and ability to use techniques and skills needed for reducing stress and depression.  Initial Review & Psychosocial Screening:  Initial Psych Review & Screening - 03/31/20 1052      Screening Interventions   Expected Outcomes Long Term goal: The participant improves quality of Life and PHQ9 Scores as seen by post scores and/or verbalization of changes           Quality of Life Scores:  Scores of 19 and below usually indicate a poorer quality of life in these areas.  A difference of  2-3 points is a clinically meaningful difference.  A difference of 2-3 points in the total score of the Quality of Life Index has been associated with significant improvement in overall quality of life, self-image, physical symptoms, and general health in studies assessing change in quality of  life.  PHQ-9: Recent Review Flowsheet Data    Depression screen [facility] 2/9 03/31/2020 02/08/2020 04/21/2019 04/14/2019 01/27/2019   Decreased Interest 1 0 0 0 0   Down, Depressed, Hopeless 1 0 0 0 0   PHQ - 2 Score 2 0 0 0 0   Altered sleeping 0 - - - -   Tired, decreased  energy 3 - - - -   Change in appetite 0 - - - -   Feeling bad or failure about yourself  0 - - - -   Trouble concentrating 0 - - - -   Moving slowly or fidgety/restless 0 - - - -   Suicidal thoughts 0 - - - -   PHQ-9 Score 5 - - - -   Difficult doing work/chores Not difficult at all - - - -     Interpretation of Total Score  Total Score Depression Severity:  1-4 = Minimal depression, 5-9 = Mild depression, 10-14 = Moderate depression, 15-19 = Moderately severe depression, 20-27 = Severe depression   Psychosocial Evaluation and Intervention:   Psychosocial Re-Evaluation:  Psychosocial Re-Evaluation    Row Name 04/24/20 1316 05/15/20 0854           Psychosocial Re-Evaluation   Current issues with None Identified None Identified      Comments Anica was exposed to covid and had symptoms even though she tested negative and will start exercising in pulmonary rehab tomorrow, 04/25/2020.  I am unable to evaluate her psychosocial status due to her absent from the program.  I will re-evaluate when she returns. No psychosocial concerns identified.      Expected Outcomes For Amita to be free of psychosocial concerns while participating in pulmonary rehab. For Kyndle to continue to be free of psychosocial concerns while participating in pulmonary rehab.      Interventions Encouraged to attend Pulmonary Rehabilitation for the exercise;Relaxation education;Stress management education Encouraged to attend Pulmonary Rehabilitation for the exercise      Continue Psychosocial Services  No Follow up required No Follow up required             Psychosocial Discharge (Final Psychosocial Re-Evaluation):  Psychosocial  Re-Evaluation - 05/15/20 3754      Psychosocial Re-Evaluation   Current issues with None Identified    Comments No psychosocial concerns identified.    Expected Outcomes For Mariaelena to continue to be free of psychosocial concerns while participating in pulmonary rehab.    Interventions Encouraged to attend Pulmonary Rehabilitation for the exercise    Continue Psychosocial Services  No Follow up required           Education: Education Goals: Education classes will be provided on a weekly basis, covering required topics. Participant will state understanding/return demonstration of topics presented.  Learning Barriers/Preferences:  Learning Barriers/Preferences - 03/31/20 1008      Learning Barriers/Preferences   Learning Barriers None    Learning Preferences Skilled Demonstration;Written Material;Pictoral           Education Topics: Risk Factor Reduction:  -Group instruction that is supported by a PowerPoint presentation. Instructor discusses the definition of a risk factor, different risk factors for pulmonary disease, and how the heart and lungs work together.     Nutrition for Pulmonary Patient:  -Group instruction provided by PowerPoint slides, verbal discussion, and written materials to support subject matter. The instructor gives an explanation and review of healthy diet recommendations, which includes a discussion on weight management, recommendations for fruit and vegetable consumption, as well as protein, fluid, caffeine, fiber, sodium, sugar, and alcohol. Tips for eating when patients are short of breath are discussed.   Pursed Lip Breathing:  -Group instruction that is supported by demonstration and informational handouts. Instructor discusses the benefits of pursed lip and diaphragmatic breathing and detailed demonstration on how to preform both.     Oxygen Safety:  -  Group instruction provided by PowerPoint, verbal discussion, and written material to support  subject matter. There is an overview of "What is Oxygen" and "Why do we need it".  Instructor also reviews how to create a safe environment for oxygen use, the importance of using oxygen as prescribed, and the risks of noncompliance. There is a brief discussion on traveling with oxygen and resources the patient may utilize.   Oxygen Equipment:  -Group instruction provided by [facility] Staff utilizing handouts, written materials, and equipment demonstrations.   Signs and Symptoms:  -Group instruction provided by written material and verbal discussion to support subject matter. Warning signs and symptoms of infection, stroke, and heart attack are reviewed and when to call the physician/911 reinforced. Tips for preventing the spread of infection discussed.   Advanced Directives:  -Group instruction provided by verbal instruction and written material to support subject matter. Instructor reviews Advanced Directive laws and proper instruction for filling out document.   Pulmonary Video:  -Group video education that reviews the importance of medication and oxygen compliance, exercise, good nutrition, pulmonary hygiene, and pursed lip and diaphragmatic breathing for the pulmonary patient.   Exercise for the Pulmonary Patient:  -Group instruction that is supported by a PowerPoint presentation. Instructor discusses benefits of exercise, core components of exercise, frequency, duration, and intensity of an exercise routine, importance of utilizing pulse oximetry during exercise, safety while exercising, and options of places to exercise outside of rehab.     Pulmonary Medications:  -Verbally interactive group education provided by instructor with focus on inhaled medications and proper administration.   Anatomy and Physiology of the Respiratory System and Intimacy:  -Group instruction provided by PowerPoint, verbal discussion, and written material to support subject matter. Instructor reviews  respiratory cycle and anatomical components of the respiratory system and their functions. Instructor also reviews differences in obstructive and restrictive respiratory diseases with examples of each. Intimacy, Sex, and Sexuality differences are reviewed with a discussion on how relationships can change when diagnosed with pulmonary disease. Common sexual concerns are reviewed.   MD DAY -A group question and answer session with a medical doctor that allows participants to ask questions that relate to their pulmonary disease state.   OTHER EDUCATION -Group or individual verbal, written, or video instructions that support the educational goals of the pulmonary rehab program. [place] from 05/09/2020 in [place]  Date 05/09/20  Educator Handout  [My Plate]      Holiday Eating Survival Tips:  -Group instruction provided by PowerPoint slides, verbal discussion, and written materials to support subject matter. The instructor gives patients tips, tricks, and techniques to help them not only survive but enjoy the holidays despite the onslaught of food that accompanies the holidays.   Knowledge Questionnaire Score:  Knowledge Questionnaire Score - 03/31/20 1104      Knowledge Questionnaire Score   Pre Score 13/18           Core Components/Risk Factors/Patient Goals at Admission:  Personal Goals and Risk Factors at Admission - 03/31/20 1053      Core Components/Risk Factors/Patient Goals on Admission   Hypertension Yes    Intervention Provide education on lifestyle modifcations including regular physical activity/exercise, weight management, moderate sodium restriction and increased consumption of fresh fruit, vegetables, and low fat dairy, alcohol moderation, and smoking cessation.    Expected Outcomes Long Term: Maintenance of blood pressure at goal levels.    Lipids Yes    Expected  Outcomes Short Term: Participant  states understanding of desired cholesterol values and is compliant with medications prescribed. Participant is following exercise prescription and nutrition guidelines.;Long Term: Cholesterol controlled with medications as prescribed, with individualized exercise RX and with personalized nutrition plan. Value goals: LDL < 95m, HDL > 40 mg.           Core Components/Risk Factors/Patient Goals Review:   Goals and Risk Factor Review    Row Name 04/24/20 1321 05/15/20 0855           Core Components/Risk Factors/Patient Goals Review   Personal Goals Review Develop more efficient breathing techniques such as purse lipped breathing and diaphragmatic breathing and practicing self-pacing with activity.;Increase knowledge of respiratory medications and ability to use respiratory devices properly.;Improve shortness of breath with ADL's Develop more efficient breathing techniques such as purse lipped breathing and diaphragmatic breathing and practicing self-pacing with activity.;Increase knowledge of respiratory medications and ability to use respiratory devices properly.;Improve shortness of breath with ADL's      Review FKassadywill start pulmonary rehab tomorrow, 04/25/2020. FDennieis progressing well, her attendance is regular, she is exercising on the nustep and walking on the track.      Expected Outcomes See admission goals. See admission goals.             Core Components/Risk Factors/Patient Goals at Discharge (Final Review):   Goals and Risk Factor Review - 05/15/20 0855      Core Components/Risk Factors/Patient Goals Review   Personal Goals Review Develop more efficient breathing techniques such as purse lipped breathing and diaphragmatic breathing and practicing self-pacing with activity.;Increase knowledge of respiratory medications and ability to use respiratory devices properly.;Improve shortness of breath with ADL's    Review FChiamakais progressing well, her attendance is regular,  she is exercising on the nustep and walking on the track.    Expected Outcomes See admission goals.           ITP Comments:   Comments: ITP REVIEW Pt is making expected progress toward pulmonary rehab goals after completing 6 sessions. Recommend continued exercise, life style modification, education, and utilization of breathing techniques to increase stamina and strength and decrease shortness of breath with exertion.

## 2020-05-16 NOTE — Progress Notes (Signed)
Daily Session Note  Patient Details  Name: Kristen Suarez MRN: [id number] Date of Birth: 09-12-1945 Referring Provider:   April Manson Pulmonary Rehab Walk Test from 03/31/2020 in [place]  Referring Provider Dr. Lamonte Sakai      Encounter Date: 05/16/2020  Check In:  Session Check In - 05/16/20 1429      Check-In   Supervising physician immediately available to respond to emergencies [facility] immediately available    Physician(s) Dr. Venetia Constable    Location [facility]    Staff Present Rosebud Poles, RN, BSN;Lisa Ysidro Evert, RN;Jessica Hassell Done, MS, ACSM-CEP, Exercise Physiologist    Virtual Visit No    Medication changes reported     No    Fall or balance concerns reported    No    Tobacco Cessation No Change    Warm-up and Cool-down Performed on first and last piece of equipment    Resistance Training Performed Yes    VAD Patient? No    PAD/SET Patient? No      Pain Assessment   Currently in Pain? No/denies    Multiple Pain Sites No           Capillary Blood Glucose: No results found for this or any previous visit (from the past 24 hour(s)).    Social History   Tobacco Use  Smoking Status Never Smoker  Smokeless Tobacco Never Used    Goals Met:  Exercise tolerated well No report of cardiac concerns or symptoms Strength training completed today  Goals Unmet:  Not Applicable  Comments: Service time is from 1325 to 1430    Dr. Fransico Him is Medical Director for Cardiac Rehab at [facility].

## 2020-05-17 ENCOUNTER — Other Ambulatory Visit: Payer: Self-pay

## 2020-05-17 ENCOUNTER — Ambulatory Visit: Payer: Medicare Other | Attending: Obstetrics and Gynecology | Admitting: Physical Therapy

## 2020-05-17 ENCOUNTER — Encounter: Payer: Self-pay | Admitting: Physical Therapy

## 2020-05-17 DIAGNOSIS — R278 Other lack of coordination: Secondary | ICD-10-CM | POA: Insufficient documentation

## 2020-05-17 DIAGNOSIS — M6281 Muscle weakness (generalized): Secondary | ICD-10-CM | POA: Diagnosis not present

## 2020-05-17 NOTE — Patient Instructions (Signed)
Access Code: [email or web address] URL: [email or web address] Date: 05/17/2020 Prepared by: Earlie Counts  Exercises Diaphragmatic Breathing at 90/90 Supported - 1 x daily - 7 x weekly - 1 sets - 1 reps - 10 minutes hold Hooklying Transversus Abdominis Palpation - 1 x daily - 7 x weekly - 1 sets - 10 reps - 5 sec hold [facility] [address] [place], [place] [address] Phone # [phone] Fax [phone]

## 2020-05-17 NOTE — Therapy (Signed)
[facility] [address], [place], [place], [address] Phone: [phone]   Fax:  [phone]  Physical Therapy Treatment  Patient Details  Name: Kristen Suarez MRN: [id number] Date of Birth: Jun 17, 1945 Referring Provider (PT): Dr. Sherlene Shams   Encounter Date: 05/17/2020   PT End of Session - 05/17/20 1148    Visit Number 2    Date for PT Re-Evaluation 07/12/20    Authorization Type medicare    Authorization - Visit Number 2    Authorization - Number of Visits 10    PT Start Time 6546    PT Stop Time 1225    PT Time Calculation (min) 40 min    Activity Tolerance Patient tolerated treatment well    Behavior During Therapy [facility] for tasks assessed/performed           Past Medical History:  Diagnosis Date  . Arthritis   . Asthma   . Chickenpox   . Colon polyps 2010  . Heart murmur   . History of fainting spells of unknown cause   . Hyperlipidemia   . Hypertension   . Sarcoid 06/16/1976    Past Surgical History:  Procedure Laterality Date  . ABDOMINAL HYSTERECTOMY  1987  . LUNG BIOPSY  06/16/1976    There were no vitals filed for this visit.   Subjective Assessment - 05/17/20 1148    Subjective I had COVID in January. I am having trouble getting my energy. I am in my 4th week for pulmonary rehab. No change with prolapse.    Patient Stated Goals I want to get to a place to maintain myself    Currently in Pain? No/denies    Multiple Pain Sites No                             [facility] Adult PT Treatment/Exercise - 05/17/20 0001      Self-Care   Self-Care Other Self-Care Comments    Other Self-Care Comments  lay on 2 pillows with hips on them to reduce her prolapse and perform some manual work to release around the bladder.      Neuro Re-ed    Neuro Re-ed Details  diaphragmatic breathing with using a red band around the lower rib cage to open up the lower rib cage      Lumbar  Exercises: Supine   Ab Set 10 reps;5 seconds    AB Set Limitations abdominal contraction with tactile cues to contract upper and lower while breathing      Manual Therapy   Manual Therapy Soft tissue mobilization;Myofascial release    Soft tissue mobilization thoracic parapsinals to improve opening of the back rib cage    Myofascial Release fascial release along the pubovesical ligaments, along the bladder and release through the layers of fascia for the urogenital system                  PT Education - 05/17/20 1324    Education Details Access Code: [email or web address]    Person(s) Educated Patient    Methods Explanation;Demonstration;Verbal cues;Handout    Comprehension Returned demonstration;Verbalized understanding            PT Short Term Goals - 04/19/20 1239      PT SHORT TERM GOAL #1   Title independent with initial HEP for diaphgramatic breathing and abdominal contraction    Time 12    Period Weeks    Status  New    Target Date 07/12/20      PT SHORT TERM GOAL #2   Title understand ways to manage prolapse    Time 12    Period Weeks    Status New    Target Date 07/12/20             PT Long Term Goals - 04/19/20 1240      PT LONG TERM GOAL #1   Title independent with advanced HEP for core and pelvic floor strengthening    Time 12    Period Weeks    Status New    Target Date 07/12/20      PT LONG TERM GOAL #2   Title able to contract the pelvic floor >/= 3/5 strength for 6 seconds with correct abdominal contraction    Time 12    Period Weeks    Status New    Target Date 07/12/20      PT LONG TERM GOAL #3   Title understand how to deter the urge to void at night to reduce her toileting to 1-2 times per night    Time 12    Period Weeks    Status New    Target Date 07/12/20      PT LONG TERM GOAL #4   Title able to not wear a depends due to urinaty leakage decreased >/= 75% due to improve pelvic floor and abdominal contractions    Time 12    Period  Weeks    Status New    Target Date 07/12/20                 Plan - 05/17/20 1313    Clinical Impression Statement Patient has not been to therapy for 4 weeks due to having COVID. Patient understands how to lay with her hips on pillows to reduce the prolapse. She needs tactile cues to open up the lower rib cage and not bulge her abdomen. Patient felt lighter in the pelvic region after manual work. She had fascial restrictions in the lower abdomen. Patient will benefit from skilled therapy to improve pelvic floor strength and endurance, pelvic floor coordination and abdominal contraction and improve quality of life.    Personal Factors and Comorbidities Comorbidity 3+    Comorbidities Abdominal hysterectomy 1987; Sarcoid; prolapse anterior and posterior wall    Examination-Activity Limitations Continence    Stability/Clinical Decision Making Evolving/Moderate complexity    Rehab Potential Excellent    PT Duration 12 weeks    PT Treatment/Interventions ADLs/Self Care Home Management;Biofeedback;Cryotherapy;Electrical Stimulation;Moist Heat;Neuromuscular re-education;Therapeutic exercise;Therapeutic activities;Patient/family education;Manual techniques;Passive range of motion;Joint Manipulations;Spinal Manipulations    PT Next Visit Plan work on rib cage mobility for contracting of the abdominals, information on prolapse, laying on side with pushing on the ball to contract the abdominals    PT Home Exercise Plan Access Code: [email or web address]    Recommended Other Services MD signed note    Consulted and Agree with Plan of Care Patient           Patient will benefit from skilled therapeutic intervention in order to improve the following deficits and impairments:  Decreased coordination,Decreased range of motion,Increased fascial restricitons,Increased muscle spasms,Decreased activity tolerance,Decreased mobility,Decreased strength  Visit Diagnosis: Muscle weakness (generalized)  Other lack of  coordination     Problem List Patient Active Problem List   Diagnosis Date Noted  . History of loop recorder 12/13/2019  . Allergic rhinitis 12/08/2019  . Cough 11/08/2019  . Post-nasal drip 11/08/2019  .  Multiple joint pain 04/22/2019  . Bilateral leg edema 04/22/2019  . Vaginal atrophy 01/05/2019  . Resting tremor 09/08/2018  . Sarcoidosis 05/08/2018  . HTN (hypertension), benign 05/08/2018  . Colon polyp 05/08/2018  . Family hx of colon cancer 05/08/2018  . Cystocele, midline 05/08/2018  . Hyperlipidemia 05/08/2018  . Vasovagal syncope 05/08/2018  . Seborrheic keratosis 05/08/2018  . Pseudophakia of both eyes 05/08/2018  . GERD (gastroesophageal reflux disease) 05/08/2018  . COPD (chronic obstructive pulmonary disease) ([place]) 05/08/2018    Earlie Counts, PT 05/17/20 1:24 PM   [facility] [address], [place], [place], [address] Phone: [phone]   Fax:  [phone]  Name: Kristen Suarez MRN: [id number] Date of Birth: 01/02/1946

## 2020-05-18 ENCOUNTER — Ambulatory Visit (HOSPITAL_COMMUNITY): Payer: Medicare Other

## 2020-05-18 ENCOUNTER — Encounter (HOSPITAL_COMMUNITY)
Admission: RE | Admit: 2020-05-18 | Discharge: 2020-05-18 | Disposition: A | Discharge status: Home / Self Care | Payer: Medicare Other | Source: Ambulatory Visit | Attending: Emergency Medicine | Admitting: Emergency Medicine

## 2020-05-18 DIAGNOSIS — D860 Sarcoidosis of lung: Secondary | ICD-10-CM | POA: Diagnosis not present

## 2020-05-18 NOTE — Progress Notes (Signed)
Daily Session Note  Patient Details  Name: Kristen Suarez MRN: [id number] Date of Birth: June 28, 1945 Referring Provider:   April Manson Pulmonary Rehab Walk Test from 03/31/2020 in [place]  Referring Provider Dr. Lamonte Sakai      Encounter Date: 05/18/2020  Check In:  Session Check In - 05/18/20 1420      Check-In   Supervising physician immediately available to respond to emergencies [facility] immediately available    Physician(s) Dr. Doristine Bosworth    [facility]    Staff Present Rosebud Poles, RN, BSN;Lisa Ysidro Evert, RN;Jessica Hassell Done, MS, ACSM-CEP, Exercise Physiologist    Virtual Visit No    Medication changes reported     No    Tobacco Cessation No Change    Warm-up and Cool-down Performed on first and last piece of equipment    Resistance Training Performed Yes    VAD Patient? No    PAD/SET Patient? No      Pain Assessment   Currently in Pain? No/denies    Multiple Pain Sites No           Capillary Blood Glucose: No results found for this or any previous visit (from the past 24 hour(s)).    Social History   Tobacco Use  Smoking Status Never Smoker  Smokeless Tobacco Never Used    Goals Met:  Exercise tolerated well No report of cardiac concerns or symptoms Strength training completed today  Goals Unmet:  Not Applicable  Comments: Service time is from 1315 to 1455    Dr. Fransico Him is Medical Director for Cardiac Rehab at [facility].

## 2020-05-18 NOTE — Progress Notes (Signed)
I have reviewed a Home Exercise Prescription with [name] . Kristen Suarez is currently exercising at home. She does yoga 1 time a week and walks occassionally. The patient was advised to walk and perform resistance training 2 additional days a week for 30-45 minutes.  [name] and I discussed how to progress their exercise prescription.  The patient stated that their goals were have better functional capacity, be stronger, and to be able to walk longer distances. The patient stated that they understand the exercise prescription.  We reviewed exercise guidelines, target heart rate during exercise, RPE Scale, weather conditions, use of rescue inhaler, endpoints for exercise, warmup and cool down.  Patient is encouraged to come to me with any questions. I will continue to follow up with the patient to assist them with progression and safety.    Rick Duff MS, ACSM CEP

## 2020-05-23 ENCOUNTER — Ambulatory Visit (HOSPITAL_COMMUNITY): Payer: Medicare Other

## 2020-05-23 ENCOUNTER — Other Ambulatory Visit: Payer: Self-pay

## 2020-05-23 ENCOUNTER — Encounter (HOSPITAL_COMMUNITY)
Admission: RE | Admit: 2020-05-23 | Discharge: 2020-05-23 | Disposition: A | Discharge status: Home / Self Care | Payer: Medicare Other | Source: Ambulatory Visit | Attending: Emergency Medicine | Admitting: Emergency Medicine

## 2020-05-23 VITALS — Wt 145.3 lb

## 2020-05-23 DIAGNOSIS — D860 Sarcoidosis of lung: Secondary | ICD-10-CM

## 2020-05-23 NOTE — Progress Notes (Signed)
Daily Session Note  Patient Details  Name: Kristen Suarez MRN: [id number] Date of Birth: 1945/07/06 Referring Provider:   April Manson Pulmonary Rehab Walk Test from 03/31/2020 in [place]  Referring Provider Dr. Lamonte Sakai      Encounter Date: 05/23/2020  Check In:  Session Check In - 05/23/20 1421      Check-In   Supervising physician immediately available to respond to emergencies [facility] immediately available    Physician(s) Dr. Doristine Bosworth    [facility]    Staff Present Rosebud Poles, RN, BSN;Lisa Ysidro Evert, RN;Caley Volkert Hassell Done, MS, ACSM-CEP, Exercise Physiologist    Virtual Visit No    Medication changes reported     No    Fall or balance concerns reported    No    Tobacco Cessation No Change    Warm-up and Cool-down Performed on first and last piece of equipment    Resistance Training Performed Yes    VAD Patient? No    PAD/SET Patient? No      Pain Assessment   Currently in Pain? No/denies    Multiple Pain Sites No           Capillary Blood Glucose: No results found for this or any previous visit (from the past 24 hour(s)).   Exercise Prescription Changes - 05/23/20 1500      Response to Exercise   Blood Pressure (Admit) 142/80    Blood Pressure (Exercise) 190/88    Blood Pressure (Exit) 106/70    Heart Rate (Admit) 83 bpm    Heart Rate (Exercise) 109 bpm    Heart Rate (Exit) 95 bpm    Oxygen Saturation (Admit) 97 %    Oxygen Saturation (Exercise) 94 %    Oxygen Saturation (Exit) 96 %    Rating of Perceived Exertion (Exercise) 12    Perceived Dyspnea (Exercise) 2    Duration Progress to 45 minutes of aerobic exercise without signs/symptoms of physical distress    Intensity THRR unchanged      Progression   Progression Continue to progress workloads to maintain intensity without signs/symptoms of physical distress.      Resistance Training   Training Prescription Yes    Weight Blue bands     Reps 10-15    Time 10 Minutes      NuStep   Level 3    SPM 80    Minutes 15    METs 2.1      Track   Laps 16    Minutes 15    METs 2.86           Social History   Tobacco Use  Smoking Status Never Smoker  Smokeless Tobacco Never Used    Goals Met:  Proper associated with RPD/PD & O2 Sat Independence with exercise equipment Exercise tolerated well No report of cardiac concerns or symptoms Strength training completed today  Goals Unmet:  Not Applicable  Comments: Service time is from 1325 to 72    Dr. Fransico Him is Medical Director for Cardiac Rehab at [facility].

## 2020-05-25 ENCOUNTER — Encounter (HOSPITAL_COMMUNITY)
Admission: RE | Admit: 2020-05-25 | Discharge: 2020-05-25 | Disposition: A | Discharge status: Home / Self Care | Payer: Medicare Other | Source: Ambulatory Visit | Attending: Emergency Medicine | Admitting: Emergency Medicine

## 2020-05-25 ENCOUNTER — Ambulatory Visit (HOSPITAL_COMMUNITY): Payer: Medicare Other

## 2020-05-25 ENCOUNTER — Other Ambulatory Visit: Payer: Self-pay

## 2020-05-25 DIAGNOSIS — D860 Sarcoidosis of lung: Secondary | ICD-10-CM | POA: Diagnosis not present

## 2020-05-25 NOTE — Progress Notes (Signed)
Daily Session Note  Patient Details  Name: Kristen Suarez MRN: [id number] Date of Birth: 09/07/1945 Referring Provider:   Flowsheet Row Pulmonary Rehab Walk Test from 03/31/2020 in [facility]  Referring Provider Dr. Byrum      Encounter Date: 05/25/2020  Check In:  Session Check In - 05/25/20 1400      Check-In   Supervising physician immediately available to respond to emergencies [facility] immediately available    Physician(s) Dr. Griggith    [facility]    Staff Present Joan Behrens, RN, BSN;Lisa Hughes, RN;David Makemson, MS, EP-C, CCRP    Virtual Visit No    Medication changes reported     No    Fall or balance concerns reported    No    Tobacco Cessation No Change    Warm-up and Cool-down Performed on first and last piece of equipment    Resistance Training Performed Yes    VAD Patient? No    PAD/SET Patient? No      Pain Assessment   Currently in Pain? No/denies    Multiple Pain Sites No           Capillary Blood Glucose: No results found for this or any previous visit (from the past 24 hour(s)).    Social History   Tobacco Use  Smoking Status Never Smoker  Smokeless Tobacco Never Used    Goals Met:  Exercise tolerated well No report of cardiac concerns or symptoms Strength training completed today  Goals Unmet:  Not Applicable  Comments: Service time is from 1325 to 1425    Dr. Traci Turner is Medical Director for Cardiac Rehab at [facility]. 

## 2020-05-30 ENCOUNTER — Other Ambulatory Visit: Payer: Self-pay

## 2020-05-30 ENCOUNTER — Encounter (HOSPITAL_COMMUNITY)
Admission: RE | Admit: 2020-05-30 | Discharge: 2020-05-30 | Disposition: A | Discharge status: Home / Self Care | Payer: Medicare Other | Source: Ambulatory Visit | Attending: Emergency Medicine | Admitting: Emergency Medicine

## 2020-05-30 ENCOUNTER — Encounter: Payer: Self-pay | Admitting: Nurse Practitioner

## 2020-05-30 ENCOUNTER — Ambulatory Visit (HOSPITAL_COMMUNITY): Payer: Medicare Other

## 2020-05-30 DIAGNOSIS — D860 Sarcoidosis of lung: Secondary | ICD-10-CM | POA: Diagnosis not present

## 2020-05-30 DIAGNOSIS — I10 Essential (primary) hypertension: Secondary | ICD-10-CM

## 2020-05-30 NOTE — Progress Notes (Signed)
Daily Session Note  Patient Details  Name: Kristen Suarez MRN: [id number] Date of Birth: 1945/07/02 Referring Provider:   April Manson Pulmonary Rehab Walk Test from 03/31/2020 in [place]  Referring Provider Dr. Lamonte Sakai      Encounter Date: 05/30/2020  Check In:   Capillary Blood Glucose: No results found for this or any previous visit (from the past 24 hour(s)).    Social History   Tobacco Use  Smoking Status Never Smoker  Smokeless Tobacco Never Used    Goals Met:  Proper associated with RPD/PD & O2 Sat Independence with exercise equipment Exercise tolerated well Strength training completed today  Goals Unmet:  Not Applicable  Comments: Service time is from 1300 to 1400    Dr. Fransico Him is Medical Director for Cardiac Rehab at [facility].

## 2020-05-31 ENCOUNTER — Encounter: Payer: Self-pay | Admitting: Physical Therapy

## 2020-05-31 ENCOUNTER — Ambulatory Visit: Payer: Medicare Other | Admitting: Physical Therapy

## 2020-05-31 DIAGNOSIS — R278 Other lack of coordination: Secondary | ICD-10-CM

## 2020-05-31 DIAGNOSIS — M6281 Muscle weakness (generalized): Secondary | ICD-10-CM | POA: Diagnosis not present

## 2020-05-31 NOTE — Therapy (Signed)
[facility] [address], [place], [place], [address] Phone: [phone]   Fax:  [phone]  Physical Therapy Treatment  Patient Details  Name: Kristen Suarez MRN: [id number] Date of Birth: [birth year] Referring Provider (PT): Dr. Sherlene Shams   Encounter Date: 05/31/2020   PT End of Session - 05/31/20 1152    Visit Number 3    Date for PT Re-Evaluation 07/12/20    Authorization Type medicare    Authorization - Visit Number 3    Authorization - Number of Visits 10    PT Start Time 7616    PT Stop Time 1223    PT Time Calculation (min) 38 min    Activity Tolerance Patient tolerated treatment well    Behavior During Therapy [facility] for tasks assessed/performed           Past Medical History:  Diagnosis Date  . Arthritis   . Asthma   . Chickenpox   . Colon polyps 2010  . Heart murmur   . History of fainting spells of unknown cause   . Hyperlipidemia   . Hypertension   . Sarcoid 06/16/1976    Past Surgical History:  Procedure Laterality Date  . ABDOMINAL HYSTERECTOMY  1987  . LUNG BIOPSY  06/16/1976    There were no vitals filed for this visit.   Subjective Assessment - 05/31/20 1148    Subjective I feel okay with the prolapse. I am getting up 2 times per night compared to 3-4. I can get to the bathroom in time without leakage in the past week.    Patient Stated Goals I want to get to a place to maintain myself    Currently in Pain? No/denies                          Pelvic Floor Special Questions - 05/31/20 0001    Pelvic Floor Internal Exam Patient confirms identification and approves PT to assess pelvic floor and treatment    Exam Type Vaginal    Palpation improved tissue mobility of the levator ani    Strength fair squeeze, definite lift    Strength # of seconds 5   contract 50%            [facility] Adult PT Treatment/Exercise - 05/31/20 0001      Therapeutic Activites     Therapeutic Activities Other Therapeutic Activities    Other Therapeutic Activities rolling to get out of bed to decreased the strain on the prolapse and breathing out; instructed patient on how to hinge at her hips so while lifting to not increase her intraabdominal pressure      Lumbar Exercises: Supine   Bridge 10 reps;1 second    Bridge Limitations with feet on physioball and contract the pelvic floor    Other Supine Lumbar Exercises ball squeeze with pelvic floor contraction holding 5 seconds with 505 squeeze then added alternale arm flexion    Other Supine Lumbar Exercises supine with pelvic floor contraction and ball squeeze with therapist giving the patient tactile cues      Lumbar Exercises: Prone   Other Prone Lumbar Exercises prone on physioball and lift both legs with pelvic floor contraction 10x                  PT Education - 05/31/20 1218    Education Details Access Code: [email or web address]    Methods Explanation;Demonstration;Verbal cues;Handout    Comprehension Returned demonstration;Verbalized  understanding            PT Short Term Goals - 05/31/20 1149      PT SHORT TERM GOAL #1   Title independent with initial HEP for diaphgramatic breathing and abdominal contraction    Time 12    Period Weeks    Status Achieved      PT SHORT TERM GOAL #2   Title understand ways to manage prolapse    Time 12    Period Weeks    Status Achieved             PT Long Term Goals - 05/31/20 1231      PT LONG TERM GOAL #1   Title independent with advanced HEP for core and pelvic floor strengthening    Time 12    Period Weeks    Status On-going      PT LONG TERM GOAL #2   Title able to contract the pelvic floor >/= 3/5 strength for 6 seconds with correct abdominal contraction    Time 12    Period Weeks    Status On-going      PT LONG TERM GOAL #3   Title understand how to deter the urge to void at night to reduce her toileting to 1-2 times per night    Time 12     Period Weeks    Status Achieved      PT LONG TERM GOAL #4   Title able to not wear a depends due to urinaty leakage decreased >/= 75% due to improve pelvic floor and abdominal contractions    Time 12    Period Weeks    Status On-going                 Plan - 05/31/20 1153    Clinical Impression Statement Patient is learning how to do daily activities with decreased strain of the pelvic floor. Patient pelvic floor strength has increased to 3/5 with holding for 5 seconds at a 50% contraction. Patient is now voiding at night 2 times instead of 3-4. Patient has not felt the prolapse come out further. Patient is learning how to exercise with gravity assisted to also help the prolapse stay internall. Patient has not has increased discomfort vaginally. Patient will benefit from skilled therapy to improve pelvic floor strength and endurance, pelvic floor coordination and abdominal contraction and improve quality of life.    Personal Factors and Comorbidities Comorbidity 3+    Comorbidities Abdominal hysterectomy 1987; Sarcoid; prolapse anterior and posterior wall    Examination-Activity Limitations Continence    Stability/Clinical Decision Making Evolving/Moderate complexity    Rehab Potential Excellent    PT Frequency 1x / week    PT Duration 12 weeks    PT Treatment/Interventions ADLs/Self Care Home Management;Biofeedback;Cryotherapy;Electrical Stimulation;Moist Heat;Neuromuscular re-education;Therapeutic exercise;Therapeutic activities;Patient/family education;Manual techniques;Passive range of motion;Joint Manipulations;Spinal Manipulations    PT Next Visit Plan work on contracting the upper abdominals, laying on side with pushing on ball and contracting the pelvic floor; advanced HEP    PT Home Exercise Plan Access Code: [email or web address]    Consulted and Agree with Plan of Care Patient           Patient will benefit from skilled therapeutic intervention in order to improve the following  deficits and impairments:  Decreased coordination,Decreased range of motion,Increased fascial restricitons,Increased muscle spasms,Decreased activity tolerance,Decreased mobility,Decreased strength  Visit Diagnosis: Muscle weakness (generalized)  Other lack of coordination     Problem List Patient Active Problem  List   Diagnosis Date Noted  . History of loop recorder 12/13/2019  . Allergic rhinitis 12/08/2019  . Cough 11/08/2019  . Post-nasal drip 11/08/2019  . Multiple joint pain 04/22/2019  . Bilateral leg edema 04/22/2019  . Vaginal atrophy 01/05/2019  . Resting tremor 09/08/2018  . Sarcoidosis 05/08/2018  . HTN (hypertension), benign 05/08/2018  . Colon polyp 05/08/2018  . Family hx of colon cancer 05/08/2018  . Cystocele, midline 05/08/2018  . Hyperlipidemia 05/08/2018  . Vasovagal syncope 05/08/2018  . Seborrheic keratosis 05/08/2018  . Pseudophakia of both eyes 05/08/2018  . GERD (gastroesophageal reflux disease) 05/08/2018  . COPD (chronic obstructive pulmonary disease) ([place]) 05/08/2018    Earlie Counts, PT 05/31/20 12:33 PM   [facility] [address] [place], [place], [address] Phone: [phone]   Fax:  [phone]  Name: Kristen Suarez MRN: [id number] Date of Birth: January 21, 1946

## 2020-05-31 NOTE — Patient Instructions (Signed)
Access Code: [email or web address] URL: [email or web address] Date: 05/31/2020 Prepared by: Earlie Counts  Exercises Diaphragmatic Breathing at 90/90 Supported - 1 x daily - 7 x weekly - 1 sets - 1 reps - 10 minutes hold Hooklying Transversus Abdominis Palpation - 1 x daily - 7 x weekly - 1 sets - 10 reps - 5 sec hold Supine Hip Adduction Isometric with Ball - 1 x daily - 7 x weekly - 1 sets - 5-10 reps - 5 sec hold Dead Bug Alternating Arm Extension - 1 x daily - 7 x weekly - 1 sets - 10 reps Supine Bridge with Pelvic Floor Contraction on Swiss Ball - 1 x daily - 7 x weekly - 1 sets - 10 reps Prone Pelvic Floor Contraction on Swiss Ball - 1 x daily - 7 x weekly - 1 sets - 10 reps [facility] [address] [place], [place] [address] Phone # [phone] Fax [phone]

## 2020-06-01 ENCOUNTER — Encounter (HOSPITAL_COMMUNITY)
Admission: RE | Admit: 2020-06-01 | Discharge: 2020-06-01 | Disposition: A | Discharge status: Home / Self Care | Payer: Medicare Other | Source: Ambulatory Visit | Attending: Emergency Medicine | Admitting: Emergency Medicine

## 2020-06-01 ENCOUNTER — Other Ambulatory Visit: Payer: Self-pay

## 2020-06-01 ENCOUNTER — Ambulatory Visit (HOSPITAL_COMMUNITY): Payer: Medicare Other

## 2020-06-01 ENCOUNTER — Encounter (HOSPITAL_COMMUNITY): Payer: Medicare Other

## 2020-06-01 DIAGNOSIS — D860 Sarcoidosis of lung: Secondary | ICD-10-CM

## 2020-06-01 NOTE — Progress Notes (Signed)
Daily Session Note  Patient Details  Name: Kristen Suarez MRN: [id number] Date of Birth: 01-17-1946 Referring Provider:   April Manson Pulmonary Rehab Walk Test from 03/31/2020 in [place]  Referring Provider Dr. Lamonte Sakai      Encounter Date: 06/01/2020  Check In:  Session Check In - 06/01/20 1319      Check-In   Supervising physician immediately available to respond to emergencies [facility] immediately available    Physician(s) Dr. Florencia Reasons    Location [facility]    Staff Present Rosebud Poles, RN, Milus Glazier, MS, EP-C, CCRP;Jessica Hassell Done, MS, ACSM-CEP, Exercise Physiologist    Virtual Visit No    Medication changes reported     No    Fall or balance concerns reported    No    Tobacco Cessation No Change    Warm-up and Cool-down Performed on first and last piece of equipment    Resistance Training Performed Yes    VAD Patient? No    PAD/SET Patient? No      Pain Assessment   Currently in Pain? No/denies    Multiple Pain Sites No           Capillary Blood Glucose: No results found for this or any previous visit (from the past 24 hour(s)).    Social History   Tobacco Use  Smoking Status Never Smoker  Smokeless Tobacco Never Used    Goals Met:  Proper associated with RPD/PD & O2 Sat Exercise tolerated well Strength training completed today  Goals Unmet:  Not Applicable  Comments: Service time is from 1305 to [place]    Dr. Fransico Him is Medical Director for Cardiac Rehab at [facility].

## 2020-06-02 ENCOUNTER — Encounter: Payer: Self-pay | Admitting: Nurse Practitioner

## 2020-06-06 ENCOUNTER — Other Ambulatory Visit: Payer: Self-pay

## 2020-06-06 ENCOUNTER — Encounter (HOSPITAL_COMMUNITY)
Admission: RE | Admit: 2020-06-06 | Discharge: 2020-06-06 | Disposition: A | Discharge status: Home / Self Care | Payer: Medicare Other | Source: Ambulatory Visit | Attending: Emergency Medicine | Admitting: Emergency Medicine

## 2020-06-06 VITALS — Wt 146.2 lb

## 2020-06-06 DIAGNOSIS — D860 Sarcoidosis of lung: Secondary | ICD-10-CM

## 2020-06-06 NOTE — Progress Notes (Signed)
Daily Session Note  Patient Details  Name: Kristen Suarez MRN: [id number] Date of Birth: 18-Oct-1945 Referring Provider:   April Manson Pulmonary Rehab Walk Test from 03/31/2020 in [place]  Referring Provider Dr. Lamonte Sakai      Encounter Date: 06/06/2020  Check In:  Session Check In - 06/06/20 1435      Check-In   Supervising physician immediately available to respond to emergencies [facility] immediately available    Physician(s) Dr. Tawanna Solo    Location [facility]    Staff Present Rosebud Poles, RN, BSN;Lisa Ysidro Evert, RN;Jessica Hassell Done, MS, ACSM-CEP, Exercise Physiologist    Virtual Visit No    Medication changes reported     No    Fall or balance concerns reported    No    Tobacco Cessation No Change    Warm-up and Cool-down Performed on first and last piece of equipment    Resistance Training Performed Yes    VAD Patient? No    PAD/SET Patient? No      Pain Assessment   Currently in Pain? No/denies    Multiple Pain Sites No           Capillary Blood Glucose: No results found for this or any previous visit (from the past 24 hour(s)).   Exercise Prescription Changes - 06/06/20 1500      Response to Exercise   Blood Pressure (Admit) 138/80    Blood Pressure (Exercise) 210/88   recheck BP after rest and water 166/84   Blood Pressure (Exit) 136/80    Heart Rate (Admit) 87 bpm    Heart Rate (Exercise) 100 bpm    Heart Rate (Exit) 90 bpm    Oxygen Saturation (Admit) 96 %    Oxygen Saturation (Exercise) 94 %    Oxygen Saturation (Exit) 95 %    Rating of Perceived Exertion (Exercise) 13    Perceived Dyspnea (Exercise) 1    Duration Progress to 30 minutes of  aerobic without signs/symptoms of physical distress    Intensity THRR unchanged      Progression   Progression Continue to progress workloads to maintain intensity without signs/symptoms of physical distress.      Resistance Training   Training  Prescription Yes    Weight blue bands    Reps 10-15    Time 10 Minutes      NuStep   Level 4    SPM 80    Minutes 15    METs 2.4      Track   Laps 16.5    Minutes 15           Social History   Tobacco Use  Smoking Status Never Smoker  Smokeless Tobacco Never Used    Goals Met:  Exercise tolerated well No report of cardiac concerns or symptoms Strength training completed today  Goals Unmet:  Not Applicable  Comments: Service time is from 1307 to 1415    Dr. Fransico Him is Medical Director for Cardiac Rehab at [facility].

## 2020-06-07 ENCOUNTER — Other Ambulatory Visit: Payer: Self-pay

## 2020-06-07 DIAGNOSIS — R0982 Postnasal drip: Secondary | ICD-10-CM

## 2020-06-07 DIAGNOSIS — R059 Cough, unspecified: Secondary | ICD-10-CM

## 2020-06-07 MED ORDER — MOMETASONE FUROATE 50 MCG/ACT NA SUSP: 2.0000 | Freq: Every day | NASAL | 3 refills | Status: DC

## 2020-06-07 NOTE — Telephone Encounter (Signed)
Last appt 04/28/20 Next ov 07/28/20 Chart supports Rx

## 2020-06-08 ENCOUNTER — Other Ambulatory Visit: Payer: Self-pay

## 2020-06-08 ENCOUNTER — Encounter (HOSPITAL_COMMUNITY)
Admission: RE | Admit: 2020-06-08 | Discharge: 2020-06-08 | Disposition: A | Discharge status: Home / Self Care | Payer: Medicare Other | Source: Ambulatory Visit | Attending: Emergency Medicine | Admitting: Emergency Medicine

## 2020-06-08 ENCOUNTER — Ambulatory Visit (INDEPENDENT_AMBULATORY_CARE_PROVIDER_SITE_OTHER): Payer: Medicare Other

## 2020-06-08 DIAGNOSIS — R55 Syncope and collapse: Secondary | ICD-10-CM

## 2020-06-08 DIAGNOSIS — D860 Sarcoidosis of lung: Secondary | ICD-10-CM | POA: Diagnosis not present

## 2020-06-08 NOTE — Progress Notes (Signed)
Daily Session Note  Patient Details  Name: Kristen Suarez MRN: [id number] Date of Birth: 1946-02-15 Referring Provider:   April Manson Pulmonary Rehab Walk Test from 03/31/2020 in [place]  Referring Provider Dr. Lamonte Sakai      Encounter Date: 06/08/2020  Check In:  Session Check In - 06/08/20 1418      Check-In   Supervising physician immediately available to respond to emergencies [facility] immediately available    Physician(s) Dr. Tawanna Solo    Location [facility]    Staff Present Rosebud Poles, RN, BSN;Caylah Plouff Ysidro Evert, RN;Jessica Hassell Done, MS, ACSM-CEP, Exercise Physiologist    Virtual Visit No    Medication changes reported     No    Fall or balance concerns reported    No    Tobacco Cessation No Change    Warm-up and Cool-down Performed on first and last piece of equipment    Resistance Training Performed Yes    VAD Patient? No    PAD/SET Patient? No      Pain Assessment   Currently in Pain? No/denies    Multiple Pain Sites No           Capillary Blood Glucose: No results found for this or any previous visit (from the past 24 hour(s)).    Social History   Tobacco Use  Smoking Status Never Smoker  Smokeless Tobacco Never Used    Goals Met:  Exercise tolerated well No report of cardiac concerns or symptoms Strength training completed today  Goals Unmet:  Not Applicable  Comments: Service time is from 1307 to 1415    Dr. Fransico Him is Medical Director for Cardiac Rehab at [facility].

## 2020-06-13 ENCOUNTER — Other Ambulatory Visit: Payer: Self-pay

## 2020-06-13 ENCOUNTER — Encounter (HOSPITAL_COMMUNITY)
Admission: RE | Admit: 2020-06-13 | Discharge: 2020-06-13 | Disposition: A | Discharge status: Home / Self Care | Payer: Medicare Other | Source: Ambulatory Visit | Attending: Emergency Medicine | Admitting: Emergency Medicine

## 2020-06-13 DIAGNOSIS — D860 Sarcoidosis of lung: Secondary | ICD-10-CM | POA: Diagnosis not present

## 2020-06-13 LAB — CUP PACEART REMOTE DEVICE CHECK
Date Time Interrogation Session: 20220329001802
Implantable Pulse Generator Implant Date: 20190109

## 2020-06-13 NOTE — Progress Notes (Signed)
Daily Session Note  Patient Details  Name: Kristen Suarez MRN: [id number] Date of Birth: 04/26/1945 Referring Provider:   April Manson Pulmonary Rehab Walk Test from 03/31/2020 in [place]  Referring Provider Dr. Lamonte Sakai      Encounter Date: 06/13/2020  Check In:  Session Check In - 06/13/20 1319      Check-In   Supervising physician immediately available to respond to emergencies [facility] immediately available    Physician(s) Dr. Tawanna Solo    Location [facility]    Staff Present Rosebud Poles, RN, BSN;Eliyah Mcshea Ysidro Evert, RN;Jessica Hassell Done, MS, ACSM-CEP, Exercise Physiologist    Virtual Visit No    Medication changes reported     No    Fall or balance concerns reported    No    Tobacco Cessation No Change    Warm-up and Cool-down Performed on first and last piece of equipment    Resistance Training Performed Yes    VAD Patient? No    PAD/SET Patient? No      Pain Assessment   Currently in Pain? No/denies    Multiple Pain Sites No           Capillary Blood Glucose: No results found for this or any previous visit (from the past 24 hour(s)).    Social History   Tobacco Use  Smoking Status Never Smoker  Smokeless Tobacco Never Used    Goals Met:  Exercise tolerated well No report of cardiac concerns or symptoms Strength training completed today  Goals Unmet:  Not Applicable      Comments: Service time is from 1305 to 1408    Dr. Fransico Him is Medical Director for Cardiac Rehab at [facility].

## 2020-06-13 NOTE — Progress Notes (Signed)
Pulmonary Individual Treatment Plan  Patient Details  Name: Kristen Suarez MRN: [id number] Date of Birth: 10-04-1945 Referring Provider:   April Manson Pulmonary Rehab Walk Test from 03/31/2020 in [place]  Referring Provider Dr. Lamonte Sakai      Initial Encounter Date:  Flowsheet Row Pulmonary Rehab Walk Test from 03/31/2020 in [place]  Date 03/31/20      Visit Diagnosis: No diagnosis found.  Patient's Home Medications on Admission:   Current Outpatient Medications:  .  albuterol (PROVENTIL) (2.5 MG/3ML) 0.083% nebulizer solution, USE 1 VIAL VIA NEBULIZER EVERY 6 HOURS, Disp: 150 mL, Rfl: 5 .  albuterol (VENTOLIN HFA) 108 (90 Base) MCG/ACT inhaler, Inhale 2 puffs into the lungs every 6 (six) hours as needed for wheezing or shortness of breath., Disp: 8 g, Rfl: 6 .  amLODipine (NORVASC) 10 MG tablet, Take 1 tablet (10 mg total) by mouth daily., Disp: 90 tablet, Rfl: 3 .  Budeson-Glycopyrrol-Formoterol (BREZTRI AEROSPHERE) 160-9-4.8 MCG/ACT AERO, Inhale 2 puffs into the lungs in the morning and at bedtime., Disp: 32.1 g, Rfl: 2 .  CALCIUM PO, Take 1 tablet by mouth daily. , Disp: , Rfl:  .  docusate sodium (COLACE) 100 MG capsule, Take 100 mg by mouth daily. , Disp: , Rfl:  .  famotidine (PEPCID) 10 MG tablet, Take 10 mg by mouth daily. PRN, Disp: , Rfl:  .  irbesartan (AVAPRO) 300 MG tablet, Take 1 tablet (300 mg total) by mouth daily., Disp: 90 tablet, Rfl: 3 .  mometasone (NASONEX) 50 MCG/ACT nasal spray, Place 2 sprays into the nose daily., Disp: 1 each, Rfl: 3 .  Multiple Vitamins-Minerals (MULTIVITAL PO), Take 1 tablet by mouth daily. , Disp: , Rfl:  .  pantoprazole (PROTONIX) 40 MG tablet, TAKE 1 TABLET(40 MG) BY MOUTH DAILY, Disp: 30 tablet, Rfl: 5 .  PREVIDENT 5000 BOOSTER PLUS 1.1 % PSTE, SMARTSIG:To Teeth 1 to 2 Times Daily, Disp: , Rfl:  .  Probiotic Product (PROBIOTIC DAILY PO), Take 1 tablet by mouth daily. ,  Disp: , Rfl:  .  simvastatin (ZOCOR) 20 MG tablet, Take 1 tablet (20 mg total) by mouth daily., Disp: 90 tablet, Rfl: 3 .  Vaginal Lubricant (REPLENS VA), Place vaginally. cream, Disp: , Rfl:   Past Medical History: Past Medical History:  Diagnosis Date  . Arthritis   . Asthma   . Chickenpox   . Colon polyps 2010  . Heart murmur   . History of fainting spells of unknown cause   . Hyperlipidemia   . Hypertension   . Sarcoid 06/16/1976    Tobacco Use: Social History   Tobacco Use  Smoking Status Never Smoker  Smokeless Tobacco Never Used    Labs: Recent Review Scientist, physiological    Labs for ITP Cardiac and Pulmonary Rehab Latest Ref Rng & Units 09/08/2018   Cholestrol 0 - 200 mg/dL 191   LDLCALC 0 - 99 mg/dL 110(H)   HDL >39.00 mg/dL 53.30   Trlycerides 0.0 - 149.0 mg/dL 135.0      Capillary Blood Glucose: No results found for: GLUCAP   Pulmonary Assessment Scores:  Pulmonary Assessment Scores    Row Name 03/31/20 1104         ADL UCSD   ADL Phase Entry     SOB Score total 20           CAT Score   CAT Score 15  mMRC Score   mMRC Score 1           UCSD: Self-administered rating of dyspnea associated with activities of daily living (ADLs) 6-point scale (0 = "not at all" to 5 = "maximal or unable to do because of breathlessness")  Scoring Scores range from 0 to 120.  Minimally important difference is 5 units  CAT: CAT can identify the health impairment of COPD patients and is better correlated with disease progression.  CAT has a scoring range of zero to 40. The CAT score is classified into four groups of low (less than 10), medium (10 - 20), high (21-30) and very high (31-40) based on the impact level of disease on health status. A CAT score over 10 suggests significant symptoms.  A worsening CAT score could be explained by an exacerbation, poor medication adherence, poor inhaler technique, or progression of COPD or comorbid conditions.  CAT MCID  is 2 points  mMRC: mMRC (Modified Medical Research Council) Dyspnea Scale is used to assess the degree of baseline functional disability in patients of respiratory disease due to dyspnea. No minimal important difference is established. A decrease in score of 1 point or greater is considered a positive change.   Pulmonary Function Assessment:  Pulmonary Function Assessment - 03/31/20 1005      Breath   Shortness of Breath Yes;Limiting activity           Exercise Target Goals: Exercise Program Goal: Individual exercise prescription set using results from initial 6 min walk test and THRR while considering  patient's activity barriers and safety.   Exercise Prescription Goal: Initial exercise prescription builds to 30-45 minutes a day of aerobic activity, 2-3 days per week.  Home exercise guidelines will be given to patient during program as part of exercise prescription that the participant will acknowledge.  Activity Barriers & Risk Stratification:  Activity Barriers & Cardiac Risk Stratification - 03/31/20 1050      Activity Barriers & Cardiac Risk Stratification   Cardiac Risk Stratification Low           6 Minute Walk:  6 Minute Walk    Row Name 03/31/20 1100         6 Minute Walk   Phase Initial     Distance 1272 feet     Walk Time 6 minutes     # of Rest Breaks 0     MPH 2.41     METS 3.35     RPE 13     Perceived Dyspnea  1     VO2 Peak 11.72     Symptoms No     Resting HR 99 bpm     Resting BP 162/86     Resting Oxygen Saturation  96 %     Exercise Oxygen Saturation  during 6 min walk 92 %     Max Ex. HR 117 bpm     Max Ex. BP 190/90     2 Minute Post BP 186/92           Interval HR   1 Minute HR 106     2 Minute HR 109     3 Minute HR 113     4 Minute HR 114     5 Minute HR 117     6 Minute HR 114     2 Minute Post HR 97     Interval Heart Rate? Yes           Interval  Oxygen   Interval Oxygen? Yes     Baseline Oxygen Saturation % 96 %      1 Minute Oxygen Saturation % 94 %     1 Minute Liters of Oxygen 0 L     2 Minute Oxygen Saturation % 94 %     2 Minute Liters of Oxygen 0 L     3 Minute Oxygen Saturation % 94 %     3 Minute Liters of Oxygen 0 L     4 Minute Oxygen Saturation % 93 %     4 Minute Liters of Oxygen 0 L     5 Minute Oxygen Saturation % 92 %     5 Minute Liters of Oxygen 0 L     6 Minute Oxygen Saturation % 93 %     6 Minute Liters of Oxygen 0 L     2 Minute Post Oxygen Saturation % 97 %     2 Minute Post Liters of Oxygen 0 L            Oxygen Initial Assessment:  Oxygen Initial Assessment - 03/31/20 1051      Home Oxygen   Home Oxygen Device None    Sleep Oxygen Prescription None    Home Exercise Oxygen Prescription None    Home Resting Oxygen Prescription None      Initial 6 min Walk   Oxygen Used None      Program Oxygen Prescription   Program Oxygen Prescription None      Intervention   Short Term Goals To learn and understand importance of monitoring SPO2 with pulse oximeter and demonstrate accurate use of the pulse oximeter.;To learn and understand importance of maintaining oxygen saturations>88%;To learn and demonstrate proper pursed lip breathing techniques or other breathing techniques.;To learn and demonstrate proper use of respiratory medications    Long  Term Goals Verbalizes importance of monitoring SPO2 with pulse oximeter and return demonstration;Maintenance of O2 saturations>88%;Exhibits proper breathing techniques, such as pursed lip breathing or other method taught during program session;Compliance with respiratory medication;Demonstrates proper use of MDI's           Oxygen Re-Evaluation:  Oxygen Re-Evaluation    Row Name 04/25/20 0707 05/15/20 1334 06/13/20 1334         Program Oxygen Prescription   Program Oxygen Prescription None None None           Home Oxygen   Home Oxygen Device None None None     Sleep Oxygen Prescription None None None     Home Exercise  Oxygen Prescription None None None     Home Resting Oxygen Prescription None None None           Goals/Expected Outcomes   Short Term Goals To learn and understand importance of monitoring SPO2 with pulse oximeter and demonstrate accurate use of the pulse oximeter.;To learn and understand importance of maintaining oxygen saturations>88%;To learn and demonstrate proper pursed lip breathing techniques or other breathing techniques.;To learn and demonstrate proper use of respiratory medications To learn and understand importance of monitoring SPO2 with pulse oximeter and demonstrate accurate use of the pulse oximeter.;To learn and understand importance of maintaining oxygen saturations>88%;To learn and demonstrate proper pursed lip breathing techniques or other breathing techniques.;To learn and demonstrate proper use of respiratory medications To learn and understand importance of monitoring SPO2 with pulse oximeter and demonstrate accurate use of the pulse oximeter.;To learn and understand importance of maintaining oxygen saturations>88%;To learn and demonstrate proper pursed  lip breathing techniques or other breathing techniques.;To learn and demonstrate proper use of respiratory medications     Long  Term Goals Verbalizes importance of monitoring SPO2 with pulse oximeter and return demonstration;Maintenance of O2 saturations>88%;Exhibits proper breathing techniques, such as pursed lip breathing or other method taught during program session;Compliance with respiratory medication;Demonstrates proper use of MDI's Verbalizes importance of monitoring SPO2 with pulse oximeter and return demonstration;Maintenance of O2 saturations>88%;Exhibits proper breathing techniques, such as pursed lip breathing or other method taught during program session;Compliance with respiratory medication;Demonstrates proper use of MDI's Verbalizes importance of monitoring SPO2 with pulse oximeter and return demonstration;Maintenance  of O2 saturations>88%;Exhibits proper breathing techniques, such as pursed lip breathing or other method taught during program session;Compliance with respiratory medication;Demonstrates proper use of MDI's     Goals/Expected Outcomes compliance and understanding of oxygen saturation and pursed lip breathing compliance and understanding of oxygen saturation and pursed lip breathing compliance and understanding of oxygen saturation and pursed lip breathing            Oxygen Discharge (Final Oxygen Re-Evaluation):  Oxygen Re-Evaluation - 06/13/20 1334      Program Oxygen Prescription   Program Oxygen Prescription None      Home Oxygen   Home Oxygen Device None    Sleep Oxygen Prescription None    Home Exercise Oxygen Prescription None    Home Resting Oxygen Prescription None      Goals/Expected Outcomes   Short Term Goals To learn and understand importance of monitoring SPO2 with pulse oximeter and demonstrate accurate use of the pulse oximeter.;To learn and understand importance of maintaining oxygen saturations>88%;To learn and demonstrate proper pursed lip breathing techniques or other breathing techniques.;To learn and demonstrate proper use of respiratory medications    Long  Term Goals Verbalizes importance of monitoring SPO2 with pulse oximeter and return demonstration;Maintenance of O2 saturations>88%;Exhibits proper breathing techniques, such as pursed lip breathing or other method taught during program session;Compliance with respiratory medication;Demonstrates proper use of MDI's    Goals/Expected Outcomes compliance and understanding of oxygen saturation and pursed lip breathing           Initial Exercise Prescription:  Initial Exercise Prescription - 03/31/20 1100      Date of Initial Exercise RX and Referring Provider   Date 03/31/20    Referring Provider Dr. Lamonte Sakai    Expected Discharge Date 06/01/20      NuStep   Level 2    SPM 80    Minutes 15      Track    Minutes 15      Prescription Details   Frequency (times per week) 2    Duration Progress to 30 minutes of continuous aerobic without signs/symptoms of physical distress      Intensity   THRR 40-80% of Max Heartrate 58-117    Ratings of Perceived Exertion 11-13    Perceived Dyspnea 0-4      Progression   Progression Continue to progress workloads to maintain intensity without signs/symptoms of physical distress.      Resistance Training   Training Prescription Yes    Weight Orange bands    Reps 10-15           Perform Capillary Blood Glucose checks as needed.  Exercise Prescription Changes:  Exercise Prescription Changes    Row Name 04/25/20 1400 05/09/20 1400 05/15/20 1300 05/18/20 1500 05/23/20 1500     Response to Exercise   Blood Pressure (Admit) 170/80 148/81 -- -- 142/80  Blood Pressure (Exercise) 182/70 142/72 -- -- 190/88   Blood Pressure (Exit) 160/84 146/66 -- -- 106/70   Heart Rate (Admit) 88 bpm 84 bpm -- -- 83 bpm   Heart Rate (Exercise) 114 bpm 104 bpm -- -- 109 bpm   Heart Rate (Exit) 98 bpm 92 bpm -- -- 95 bpm   Oxygen Saturation (Admit) 98 % 98 % -- -- 97 %   Oxygen Saturation (Exercise) 94 % 90 % -- -- 94 %   Oxygen Saturation (Exit) 96 % 97 % -- -- 96 %   Rating of Perceived Exertion (Exercise) 14 13 -- -- 12   Perceived Dyspnea (Exercise) 3 1 -- -- 2   Duration Continue with 30 min of aerobic exercise without signs/symptoms of physical distress. Continue with 30 min of aerobic exercise without signs/symptoms of physical distress. -- -- Progress to 45 minutes of aerobic exercise without signs/symptoms of physical distress   Intensity Other (comment)  40-80% of HRR THRR unchanged -- -- THRR unchanged     Progression   Progression Continue to progress workloads to maintain intensity without signs/symptoms of physical distress. Continue to progress workloads to maintain intensity without signs/symptoms of physical distress. -- -- Continue to progress  workloads to maintain intensity without signs/symptoms of physical distress.   Average METs -- -- 2.74 -- --     Resistance Training   Training Prescription Yes Yes -- -- Yes   Weight Orange Bands orange ands -- -- Blue bands   Reps 10-15 10-15 -- -- 10-15   Time 10 Minutes 10 Minutes -- -- 10 Minutes     NuStep   Level 2 2 -- -- 3   SPM 80 80 -- -- 80   Minutes 15 15 -- -- 15   METs 1.8 1.9 -- -- 2.1     Track   Laps 14 15 -- -- 16   Minutes 15 15 -- -- 15   METs -- -- -- -- 2.86     Home Exercise Plan   Plans to continue exercise at -- -- -- Home (comment)  Walking and resistance training --   Frequency -- -- -- Add 2 additional days to program exercise sessions. --   Initial Home Exercises Provided -- -- -- 05/18/20 --   [place] Name 06/06/20 1500             Response to Exercise   Blood Pressure (Admit) 138/80       Blood Pressure (Exercise) 210/88  recheck BP after rest and water 166/84       Blood Pressure (Exit) 136/80       Heart Rate (Admit) 87 bpm       Heart Rate (Exercise) 100 bpm       Heart Rate (Exit) 90 bpm       Oxygen Saturation (Admit) 96 %       Oxygen Saturation (Exercise) 94 %       Oxygen Saturation (Exit) 95 %       Rating of Perceived Exertion (Exercise) 13       Perceived Dyspnea (Exercise) 1       Duration Progress to 30 minutes of  aerobic without signs/symptoms of physical distress       Intensity THRR unchanged               Progression   Progression Continue to progress workloads to maintain intensity without signs/symptoms of physical distress.  Resistance Training   Training Prescription Yes       Weight blue bands       Reps 10-15       Time 10 Minutes               NuStep   Level 4       SPM 80       Minutes 15       METs 2.4               Track   Laps 16.5       Minutes 15              Exercise Comments:  Exercise Comments    Row Name 04/25/20 1457 05/18/20 1538         Exercise Comments Patient  completed first day of exercise and tolerated well with no complaints or concerns. She was able to do 15 minutes on the Nustep and 15 minutes walking the track. While walking the track she did have to stop a couple of times due to shortness of breath. She was able to do the resistance bands with no mobility issues. Completed home exercise with patient. Pt states she does virtual yoga 1 time a week and also walks occassionaly. We discussed adding 2 additional days of resistance training and walking. Pt was receptive. She also has hand weights at home and was given a packet of hand weight exercise. Will continue to follow up about home exercise.             Exercise Goals and Review:  Exercise Goals    Row Name 03/31/20 1051             Exercise Goals   Increase Physical Activity Yes       Intervention Provide advice, education, support and counseling about physical activity/exercise needs.;Develop an individualized exercise prescription for aerobic and resistive training based on initial evaluation findings, risk stratification, comorbidities and participant's personal goals.       Expected Outcomes Short Term: Attend rehab on a regular basis to increase amount of physical activity.;Long Term: Add in home exercise to make exercise part of routine and to increase amount of physical activity.;Long Term: Exercising regularly at least 3-5 days a week.       Increase Strength and Stamina Yes       Intervention Provide advice, education, support and counseling about physical activity/exercise needs.;Develop an individualized exercise prescription for aerobic and resistive training based on initial evaluation findings, risk stratification, comorbidities and participant's personal goals.       Expected Outcomes Short Term: Increase workloads from initial exercise prescription for resistance, speed, and METs.;Short Term: Perform resistance training exercises routinely during rehab and add in resistance  training at home;Long Term: Improve cardiorespiratory fitness, muscular endurance and strength as measured by increased METs and functional capacity (6MWT)       Able to understand and use rate of perceived exertion (RPE) scale Yes       Intervention Provide education and explanation on how to use RPE scale       Expected Outcomes Short Term: Able to use RPE daily in rehab to express subjective intensity level;Long Term:  Able to use RPE to guide intensity level when exercising independently       Able to understand and use Dyspnea scale Yes       Intervention Provide education and explanation on how to use Dyspnea scale  Expected Outcomes Short Term: Able to use Dyspnea scale daily in rehab to express subjective sense of shortness of breath during exertion;Long Term: Able to use Dyspnea scale to guide intensity level when exercising independently       Knowledge and understanding of Target Heart Rate Range (THRR) Yes       Intervention Provide education and explanation of THRR including how the numbers were predicted and where they are located for reference       Expected Outcomes Short Term: Able to state/look up THRR;Long Term: Able to use THRR to govern intensity when exercising independently;Short Term: Able to use daily as guideline for intensity in rehab       Understanding of Exercise Prescription Yes       Intervention Provide education, explanation, and written materials on patient's individual exercise prescription       Expected Outcomes Short Term: Able to explain program exercise prescription;Long Term: Able to explain home exercise prescription to exercise independently              Exercise Goals Re-Evaluation :  Exercise Goals Re-Evaluation    Row Name 04/25/20 0708 05/15/20 1330 06/13/20 1331         Exercise Goal Re-Evaluation   Exercise Goals Review Increase Physical Activity;Increase Strength and Stamina;Able to understand and use rate of perceived exertion (RPE)  scale;Able to understand and use Dyspnea scale;Knowledge and understanding of Target Heart Rate Range (THRR);Understanding of Exercise Prescription Increase Physical Activity;Increase Strength and Stamina;Able to understand and use rate of perceived exertion (RPE) scale;Able to understand and use Dyspnea scale;Knowledge and understanding of Target Heart Rate Range (THRR);Understanding of Exercise Prescription Increase Physical Activity;Increase Strength and Stamina;Able to understand and use rate of perceived exertion (RPE) scale;Able to understand and use Dyspnea scale;Knowledge and understanding of Target Heart Rate Range (THRR);Understanding of Exercise Prescription     Comments Patient has not had her first exercise session due to being out with COVID. Patient is set to return today for 1st exercise session. Will continue to monitor and progress as she is able. Lamees has completed 6 exercise sessions and has tolerated exercise well so far. She has made some progression with MET increases. She is becoming independent with all exercises and is able to do everything. She is currently exercising at 2.1 METS on the Nustep and 2.74 METS walking the track. Will continue to monitor and progress as she is able. Alayza has completed 15 exercise sessions and is scheduled to graduate from the program next week. She has made great progression throughout the program. She is exercising at 2.3 METS on the Nustep and 2.86 METS on the track. She plans to continue exercise at home once she graduates from the program. Will follow up on home exercise prescription before she graduates. Will continue to monitor and progress as she is able.     Expected Outcomes Through exercise at rehab and home the patient will decrease shortness of breath with daily activities and feel confident in carrying out an exercise regimn at home. Through exercise at rehab and home the patient will decrease shortness of breath with daily activities  and feel confident in carrying out an exercise regimn at home. Through exercise at rehab and home the patient will decrease shortness of breath with daily activities and feel confident in carrying out an exercise regimn at home.            Discharge Exercise Prescription (Final Exercise Prescription Changes):  Exercise Prescription Changes - 06/06/20  1500      Response to Exercise   Blood Pressure (Admit) 138/80    Blood Pressure (Exercise) 210/88   recheck BP after rest and water 166/84   Blood Pressure (Exit) 136/80    Heart Rate (Admit) 87 bpm    Heart Rate (Exercise) 100 bpm    Heart Rate (Exit) 90 bpm    Oxygen Saturation (Admit) 96 %    Oxygen Saturation (Exercise) 94 %    Oxygen Saturation (Exit) 95 %    Rating of Perceived Exertion (Exercise) 13    Perceived Dyspnea (Exercise) 1    Duration Progress to 30 minutes of  aerobic without signs/symptoms of physical distress    Intensity THRR unchanged      Progression   Progression Continue to progress workloads to maintain intensity without signs/symptoms of physical distress.      Resistance Training   Training Prescription Yes    Weight blue bands    Reps 10-15    Time 10 Minutes      NuStep   Level 4    SPM 80    Minutes 15    METs 2.4      Track   Laps 16.5    Minutes 15           Nutrition:  Target Goals: Understanding of nutrition guidelines, daily intake of sodium <1569m, cholesterol <2045m calories 30% from fat and 7% or less from saturated fats, daily to have 5 or more servings of fruits and vegetables.  Biometrics:  Pre Biometrics - 03/31/20 1051      Pre Biometrics   Grip Strength 17 kg            Nutrition Therapy Plan and Nutrition Goals:  Nutrition Therapy & Goals - 05/04/20 1427      Nutrition Therapy   Diet Generally healthy      Personal Nutrition Goals   Nutrition Goal Pt to continue to  build a healthy plate including vegetables, fruits, whole grains, and low-fat dairy  products in a heart healthy meal plan.      Intervention Plan   Intervention Prescribe, educate and counsel regarding individualized specific dietary modifications aiming towards targeted core components such as weight, hypertension, lipid management, diabetes, heart failure and other comorbidities.    Expected Outcomes Short Term Goal: Understand basic principles of dietary content, such as calories, fat, sodium, cholesterol and nutrients.           Nutrition Assessments:  MEDIFICTS Score Key:  ?70 Need to make dietary changes   40-70 Heart Healthy Diet  ? 40 Therapeutic Level Cholesterol Diet  Flowsheet Row PULMONARY REHAB OTHER RESPIRATORY [facility] Total Score on Ad[facility] 79     Picture Your Plate Scores:  <4<76nhealthy dietary pattern with much room for improvement.  41-50 Dietary pattern unlikely to meet recommendations for good health and room for improvement.  51-60 More healthful dietary pattern, with some room for improvement.   >60 Healthy dietary pattern, although there may be some specific behaviors that could be improved.    Nutrition Goals Re-Evaluation:  Nutrition Goals Re-Evaluation    [place] 05/04/20 1427 05/11/20 0907 06/07/20 1517         Goals   Current Weight 146 lb (66.2 kg) 145 lb 11.6 oz (66.1 kg) 146 lb 2.6 oz (66.3 kg)     Nutrition Goal -- -- Pt to continue to  build a  healthy plate including vegetables, fruits, whole grains, and low-fat dairy products in a heart healthy meal plan.     Expected Outcome Maintain a healthy diet -- Maintain a healthy diet            Nutrition Goals Discharge (Final Nutrition Goals Re-Evaluation):  Nutrition Goals Re-Evaluation - 06/07/20 1517      Goals   Current Weight 146 lb 2.6 oz (66.3 kg)    Nutrition Goal Pt to continue to  build a healthy plate including vegetables, fruits, whole grains, and low-fat dairy products in a heart  healthy meal plan.    Expected Outcome Maintain a healthy diet           Psychosocial: Target Goals: Acknowledge presence or absence of significant depression and/or stress, maximize coping skills, provide positive support system. Participant is able to verbalize types and ability to use techniques and skills needed for reducing stress and depression.  Initial Review & Psychosocial Screening:  Initial Psych Review & Screening - 03/31/20 1052      Screening Interventions   Expected Outcomes Long Term goal: The participant improves quality of Life and PHQ9 Scores as seen by post scores and/or verbalization of changes           Quality of Life Scores:  Scores of 19 and below usually indicate a poorer quality of life in these areas.  A difference of  2-3 points is a clinically meaningful difference.  A difference of 2-3 points in the total score of the Quality of Life Index has been associated with significant improvement in overall quality of life, self-image, physical symptoms, and general health in studies assessing change in quality of life.  PHQ-9: Recent Review Flowsheet Data    Depression screen [facility] 2/9 03/31/2020 02/08/2020 04/21/2019 04/14/2019 01/27/2019   Decreased Interest 1 0 0 0 0   Down, Depressed, Hopeless 1 0 0 0 0   PHQ - 2 Score 2 0 0 0 0   Altered sleeping 0 - - - -   Tired, decreased energy 3 - - - -   Change in appetite 0 - - - -   Feeling bad or failure about yourself  0 - - - -   Trouble concentrating 0 - - - -   Moving slowly or fidgety/restless 0 - - - -   Suicidal thoughts 0 - - - -   PHQ-9 Score 5 - - - -   Difficult doing work/chores Not difficult at all - - - -     Interpretation of Total Score  Total Score Depression Severity:  1-4 = Minimal depression, 5-9 = Mild depression, 10-14 = Moderate depression, 15-19 = Moderately severe depression, 20-27 = Severe depression   Psychosocial Evaluation and Intervention:   Psychosocial Re-Evaluation:   Psychosocial Re-Evaluation    Row Name 04/24/20 1316 05/15/20 0854 06/12/20 1329         Psychosocial Re-Evaluation   Current issues with None Identified None Identified None Identified     Comments Madisynn was exposed to covid and had symptoms even though she tested negative and will start exercising in pulmonary rehab tomorrow, 04/25/2020.  I am unable to evaluate her psychosocial status due to her absent from the program.  I will re-evaluate when she returns. No psychosocial concerns identified. No psychosocial concerns identified at this time.     Expected Outcomes For Jinx to be free of psychosocial concerns while participating in pulmonary rehab. For Aristea to continue to be free  of psychosocial concerns while participating in pulmonary rehab. For Cataleya to continue with no psychosocial concerns while participating in pulmonary rehab.     Interventions Encouraged to attend Pulmonary Rehabilitation for the exercise;Relaxation education;Stress management education Encouraged to attend Pulmonary Rehabilitation for the exercise Encouraged to attend Pulmonary Rehabilitation for the exercise     Continue Psychosocial Services  No Follow up required No Follow up required No Follow up required            Psychosocial Discharge (Final Psychosocial Re-Evaluation):  Psychosocial Re-Evaluation - 06/12/20 1329      Psychosocial Re-Evaluation   Current issues with None Identified    Comments No psychosocial concerns identified at this time.    Expected Outcomes For Harvest to continue with no psychosocial concerns while participating in pulmonary rehab.    Interventions Encouraged to attend Pulmonary Rehabilitation for the exercise    Continue Psychosocial Services  No Follow up required           Education: Education Goals: Education classes will be provided on a weekly basis, covering required topics. Participant will state understanding/return demonstration of topics  presented.  Learning Barriers/Preferences:  Learning Barriers/Preferences - 03/31/20 1008      Learning Barriers/Preferences   Learning Barriers None    Learning Preferences Skilled Demonstration;Written Material;Pictoral           Education Topics: Risk Factor Reduction:  -Group instruction that is supported by a PowerPoint presentation. Instructor discusses the definition of a risk factor, different risk factors for pulmonary disease, and how the heart and lungs work together.     Nutrition for Pulmonary Patient:  -Group instruction provided by PowerPoint slides, verbal discussion, and written materials to support subject matter. The instructor gives an explanation and review of healthy diet recommendations, which includes a discussion on weight management, recommendations for fruit and vegetable consumption, as well as protein, fluid, caffeine, fiber, sodium, sugar, and alcohol. Tips for eating when patients are short of breath are discussed.   Pursed Lip Breathing:  -Group instruction that is supported by demonstration and informational handouts. Instructor discusses the benefits of pursed lip and diaphragmatic breathing and detailed demonstration on how to preform both.     Oxygen Safety:  -Group instruction provided by PowerPoint, verbal discussion, and written material to support subject matter. There is an overview of "What is Oxygen" and "Why do we need it".  Instructor also reviews how to create a safe environment for oxygen use, the importance of using oxygen as prescribed, and the risks of noncompliance. There is a brief discussion on traveling with oxygen and resources the patient may utilize. Flowsheet Row PULMONARY REHAB OTHER RESPIRATORY from 05/25/2020 in [place]  Date 05/18/20  Educator Handout      Oxygen Equipment:  -Group instruction provided by [facility] Staff utilizing handouts, written materials, and equipment  demonstrations.   Signs and Symptoms:  -Group instruction provided by written material and verbal discussion to support subject matter. Warning signs and symptoms of infection, stroke, and heart attack are reviewed and when to call the physician/911 reinforced. Tips for preventing the spread of infection discussed.   Advanced Directives:  -Group instruction provided by verbal instruction and written material to support subject matter. Instructor reviews Advanced Directive laws and proper instruction for filling out document.   Pulmonary Video:  -Group video education that reviews the importance of medication and oxygen compliance, exercise, good nutrition, pulmonary hygiene, and pursed lip and diaphragmatic breathing for the  pulmonary patient.   Exercise for the Pulmonary Patient:  -Group instruction that is supported by a PowerPoint presentation. Instructor discusses benefits of exercise, core components of exercise, frequency, duration, and intensity of an exercise routine, importance of utilizing pulse oximetry during exercise, safety while exercising, and options of places to exercise outside of rehab.     Pulmonary Medications:  -Verbally interactive group education provided by instructor with focus on inhaled medications and proper administration.   Anatomy and Physiology of the Respiratory System and Intimacy:  -Group instruction provided by PowerPoint, verbal discussion, and written material to support subject matter. Instructor reviews respiratory cycle and anatomical components of the respiratory system and their functions. Instructor also reviews differences in obstructive and restrictive respiratory diseases with examples of each. Intimacy, Sex, and Sexuality differences are reviewed with a discussion on how relationships can change when diagnosed with pulmonary disease. Common sexual concerns are reviewed. Flowsheet Row PULMONARY REHAB OTHER RESPIRATORY from 05/25/2020 in [place]  Date 05/25/20  Educator Handout      MD DAY -A group question and answer session with a medical doctor that allows participants to ask questions that relate to their pulmonary disease state.   OTHER EDUCATION -Group or individual verbal, written, or video instructions that support the educational goals of the pulmonary rehab program. [place] from 05/25/2020 in [place]  Date 05/09/20  Educator Handout  [My Plate]      Holiday Eating Survival Tips:  -Group instruction provided by PowerPoint slides, verbal discussion, and written materials to support subject matter. The instructor gives patients tips, tricks, and techniques to help them not only survive but enjoy the holidays despite the onslaught of food that accompanies the holidays.   Knowledge Questionnaire Score:  Knowledge Questionnaire Score - 03/31/20 1104      Knowledge Questionnaire Score   Pre Score 13/18           Core Components/Risk Factors/Patient Goals at Admission:  Personal Goals and Risk Factors at Admission - 03/31/20 1053      Core Components/Risk Factors/Patient Goals on Admission   Hypertension Yes    Intervention Provide education on lifestyle modifcations including regular physical activity/exercise, weight management, moderate sodium restriction and increased consumption of fresh fruit, vegetables, and low fat dairy, alcohol moderation, and smoking cessation.    Expected Outcomes Long Term: Maintenance of blood pressure at goal levels.    Lipids Yes    Expected Outcomes Short Term: Participant states understanding of desired cholesterol values and is compliant with medications prescribed. Participant is following exercise prescription and nutrition guidelines.;Long Term: Cholesterol controlled with medications as prescribed, with individualized exercise RX and with personalized nutrition plan.  Value goals: LDL < $Rem'70mg'rMkz$ , HDL > 40 mg.           Core Components/Risk Factors/Patient Goals Review:   Goals and Risk Factor Review    Row Name 04/24/20 1321 05/15/20 0855 06/12/20 1330         Core Components/Risk Factors/Patient Goals Review   Personal Goals Review Develop more efficient breathing techniques such as purse lipped breathing and diaphragmatic breathing and practicing self-pacing with activity.;Increase knowledge of respiratory medications and ability to use respiratory devices properly.;Improve shortness of breath with ADL's Develop more efficient breathing techniques such as purse lipped breathing and diaphragmatic breathing and practicing self-pacing with activity.;Increase knowledge of respiratory medications and ability to use respiratory devices properly.;Improve shortness of breath with ADL's Develop  more efficient breathing techniques such as purse lipped breathing and diaphragmatic breathing and practicing self-pacing with activity.;Increase knowledge of respiratory medications and ability to use respiratory devices properly.;Improve shortness of breath with ADL's     Review Falicia will start pulmonary rehab tomorrow, 04/25/2020. Emagene is progressing well, her attendance is regular, she is exercising on the nustep and walking on the track. Aariah graduates in 2 weeks, she has progressed well, she still has difficulty with shortness of breath while walking post covid-19.  She has noticed that she can perform household chores easier though.     Expected Outcomes See admission goals. See admission goals. See admission goals.            Core Components/Risk Factors/Patient Goals at Discharge (Final Review):   Goals and Risk Factor Review - 06/12/20 1330      Core Components/Risk Factors/Patient Goals Review   Personal Goals Review Develop more efficient breathing techniques such as purse lipped breathing and diaphragmatic breathing and practicing self-pacing with  activity.;Increase knowledge of respiratory medications and ability to use respiratory devices properly.;Improve shortness of breath with ADL's    Review Jera graduates in 2 weeks, she has progressed well, she still has difficulty with shortness of breath while walking post covid-19.  She has noticed that she can perform household chores easier though.    Expected Outcomes See admission goals.           ITP Comments:   Comments:

## 2020-06-14 ENCOUNTER — Encounter: Payer: Self-pay | Admitting: Physical Therapy

## 2020-06-14 ENCOUNTER — Ambulatory Visit: Payer: Medicare Other | Admitting: Physical Therapy

## 2020-06-14 ENCOUNTER — Other Ambulatory Visit: Payer: Self-pay

## 2020-06-14 DIAGNOSIS — M6281 Muscle weakness (generalized): Secondary | ICD-10-CM | POA: Diagnosis not present

## 2020-06-14 DIAGNOSIS — R278 Other lack of coordination: Secondary | ICD-10-CM

## 2020-06-14 NOTE — Patient Instructions (Signed)
Access Code: [email or web address] URL: [email or web address] Date: 06/14/2020 Prepared by: Earlie Counts  Exercises Diaphragmatic Breathing at 90/90 Supported - 1 x daily - 7 x weekly - 1 sets - 1 reps - 10 minutes hold Hooklying Transversus Abdominis Palpation - 1 x daily - 7 x weekly - 1 sets - 10 reps - 5 sec hold Supine Hip Adduction Isometric with Ball - 1 x daily - 7 x weekly - 1 sets - 5-10 reps - 5 sec hold Dead Bug Alternating Arm Extension - 1 x daily - 7 x weekly - 1 sets - 10 reps Supine Bridge with Pelvic Floor Contraction on Swiss Ball - 1 x daily - 7 x weekly - 1 sets - 10 reps Prone Pelvic Floor Contraction on Swiss Ball - 1 x daily - 7 x weekly - 1 sets - 10 reps Quadruped Leg Extensions on [employer] - 1 x daily - 1-2 x weekly - 1 sets - 15 reps Quadruped Arm Lifts on [employer] - 1 x daily - 1-2 x weekly - 1 sets - 5 reps Bird Dog on [employer] - 1 x daily - 1-2 x weekly - 1 sets - 5 reps Praying Mantis - 1 x daily - 7 x weekly - 1 sets - 5 reps [facility] [address] [place], [place] [address] Phone # [phone] Fax [phone]

## 2020-06-14 NOTE — Therapy (Signed)
[facility] [address] [place], [place], [address] Phone: [phone]   Fax:  [phone]  Physical Therapy Treatment  Patient Details  Name: Kristen Suarez MRN: [id number] Date of Birth: 11/29/1945 Referring Provider (PT): Dr. Sherlene Shams   Encounter Date: 06/14/2020   PT End of Session - 06/14/20 1230    Visit Number 4    Authorization Type medicare    Authorization - Visit Number 4    Authorization - Number of Visits 10    PT Start Time 3016    PT Stop Time 1225    PT Time Calculation (min) 40 min    Activity Tolerance Patient tolerated treatment well    Behavior During Therapy [facility] for tasks assessed/performed           Past Medical History:  Diagnosis Date  . Arthritis   . Asthma   . Chickenpox   . Colon polyps 2010  . Heart murmur   . History of fainting spells of unknown cause   . Hyperlipidemia   . Hypertension   . Sarcoid 06/16/1976    Past Surgical History:  Procedure Laterality Date  . ABDOMINAL HYSTERECTOMY  1987  . LUNG BIOPSY  06/16/1976    There were no vitals filed for this visit.   Subjective Assessment - 06/14/20 1152    Subjective Pulmonary has increased my workload and gives a low back ache. Get up 1 time general at night. I had leakage 1 time on the way to the bathroom at night. I only feel the prolapse when I sit not when I stand.    Patient Stated Goals I want to get to a place to maintain myself    Currently in Pain? No/denies    Multiple Pain Sites No                             [facility] Adult PT Treatment/Exercise - 06/14/20 0001      Exercises   Exercises Other Exercises    Other Exercises  went over you tube exercise videos for prolapse that she can do at home and safe for her to do      Lumbar Exercises: Supine   Dead Bug 10 reps    Dead Bug Limitations with pulling red band down and pelvic floor contraction      Lumbar Exercises:  Prone   Single Arm Raise Right;Left;5 reps    Single Arm Raise Weights (lbs) on physioball    Single Arm Raises Limitations with pelvic floro contraction    Straight Leg Raise 5 reps;1 second    Straight Leg Raises Limitations each leg prone on ball with pelvic floor contraction    Opposite Arm/Leg Raise Right arm/Left leg;Left arm/Right leg;5 reps    Opposite Arm/Leg Raise Limitations on physioball with pelvic floor contraction    Other Prone Lumbar Exercises modified plank on physio ball moving forward where she can keep control of the abdomen and pelvic floor contraction                  PT Education - 06/14/20 1225    Education Details Access Code: [email or web address]    Person(s) Educated Patient    Methods Explanation;Demonstration;Verbal cues;Handout    Comprehension Returned demonstration;Verbalized understanding            PT Short Term Goals - 05/31/20 1149      PT SHORT TERM GOAL #1  Title independent with initial HEP for diaphgramatic breathing and abdominal contraction    Time 12    Period Weeks    Status Achieved      PT SHORT TERM GOAL #2   Title understand ways to manage prolapse    Time 12    Period Weeks    Status Achieved             PT Long Term Goals - 06/14/20 1154      PT LONG TERM GOAL #1   Title independent with advanced HEP for core and pelvic floor strengthening    Time 12    Period Weeks    Status On-going      PT LONG TERM GOAL #2   Title able to contract the pelvic floor >/= 3/5 strength for 6 seconds with correct abdominal contraction    Time 12    Period Weeks    Status On-going      PT LONG TERM GOAL #3   Title understand how to deter the urge to void at night to reduce her toileting to 1-2 times per night    Baseline only 1 leakage since last visit    Time 12    Status Achieved      PT LONG TERM GOAL #4   Title able to not wear a depends due to urinaty leakage decreased >/= 75% due to improve pelvic floor and abdominal  contractions    Baseline wears a depends with just in case    Time 12    Period Weeks    Status On-going                 Plan - 06/14/20 1231    Clinical Impression Statement Patient will wear a depends just in case. Patient has had on accident with leakage when she was walking to the commode at night. Patient is getting up 1 time per night instead of 2 times. When she wakes up at night she has to hurry to the bathroom due to the urge to void. Patient has learned advanced HEP today and dome videos to follow. Patient only feels her prolapse when she sits down. Patient will improve pelvic floor strength and endurance, pelvic floor coordination and abdominal contraction and improve quality of life.    Personal Factors and Comorbidities Comorbidity 3+    Comorbidities Abdominal hysterectomy 1987; Sarcoid; prolapse anterior and posterior wall    Examination-Activity Limitations Continence    Stability/Clinical Decision Making Evolving/Moderate complexity    Rehab Potential Excellent    PT Frequency 1x / week    PT Duration 12 weeks    PT Treatment/Interventions ADLs/Self Care Home Management;Biofeedback;Cryotherapy;Electrical Stimulation;Moist Heat;Neuromuscular re-education;Therapeutic exercise;Therapeutic activities;Patient/family education;Manual techniques;Passive range of motion;Joint Manipulations;Spinal Manipulations    PT Home Exercise Plan Access Code: [email or web address]    Consulted and Agree with Plan of Care Patient           Patient will benefit from skilled therapeutic intervention in order to improve the following deficits and impairments:  Decreased coordination,Decreased range of motion,Increased fascial restricitons,Increased muscle spasms,Decreased activity tolerance,Decreased mobility,Decreased strength  Visit Diagnosis: Muscle weakness (generalized)  Other lack of coordination     Problem List Patient Active Problem List   Diagnosis Date Noted  . History of loop  recorder 12/13/2019  . Allergic rhinitis 12/08/2019  . Cough 11/08/2019  . Post-nasal drip 11/08/2019  . Multiple joint pain 04/22/2019  . Bilateral leg edema 04/22/2019  . Vaginal atrophy 01/05/2019  . Resting  tremor 09/08/2018  . Sarcoidosis 05/08/2018  . HTN (hypertension), benign 05/08/2018  . Colon polyp 05/08/2018  . Family hx of colon cancer 05/08/2018  . Cystocele, midline 05/08/2018  . Hyperlipidemia 05/08/2018  . Vasovagal syncope 05/08/2018  . Seborrheic keratosis 05/08/2018  . Pseudophakia of both eyes 05/08/2018  . GERD (gastroesophageal reflux disease) 05/08/2018  . COPD (chronic obstructive pulmonary disease) ([place]) 05/08/2018    Earlie Counts, PT 06/14/20 12:35 PM   [facility] [address], [place], [place], [address] Phone: [phone]   Fax:  [phone]  Name: Kristen Suarez MRN: [id number] Date of Birth: Aug 01, 1945

## 2020-06-15 ENCOUNTER — Encounter: Payer: Self-pay | Admitting: Nurse Practitioner

## 2020-06-15 ENCOUNTER — Encounter (HOSPITAL_COMMUNITY)
Admission: RE | Admit: 2020-06-15 | Discharge: 2020-06-15 | Disposition: A | Discharge status: Home / Self Care | Payer: Medicare Other | Source: Ambulatory Visit | Attending: Emergency Medicine | Admitting: Emergency Medicine

## 2020-06-15 DIAGNOSIS — D860 Sarcoidosis of lung: Secondary | ICD-10-CM | POA: Diagnosis not present

## 2020-06-15 NOTE — Progress Notes (Signed)
Daily Session Note  Patient Details  Name: Kristen Suarez MRN: [id number] Date of Birth: [birth year] Referring Provider:   April Manson Pulmonary Rehab Walk Test from 03/31/2020 in [place]  Referring Provider Dr. Lamonte Sakai      Encounter Date: 06/15/2020  Check In:  Session Check In - 06/15/20 1418      Check-In   Supervising physician immediately available to respond to emergencies [facility] immediately available    Physician(s) Dr. Florencia Reasons    Location [facility]    Staff Present Rosebud Poles, RN, BSN;Natallie Ravenscroft Ysidro Evert, RN;Jessica Hassell Done, MS, ACSM-CEP, Exercise Physiologist    Virtual Visit No    Fall or balance concerns reported    No    Tobacco Cessation No Change    Warm-up and Cool-down Performed on first and last piece of equipment    Resistance Training Performed Yes    VAD Patient? No    PAD/SET Patient? No      Pain Assessment   Currently in Pain? No/denies    Multiple Pain Sites No           Capillary Blood Glucose: No results found for this or any previous visit (from the past 24 hour(s)).    Social History   Tobacco Use  Smoking Status Never Smoker  Smokeless Tobacco Never Used    Goals Met:  Exercise tolerated well No report of cardiac concerns or symptoms Strength training completed today  Goals Unmet:  Not Applicable  Comments: Service time is from 1310 to 1415    Dr. Fransico Him is Medical Director for Cardiac Rehab at [facility].

## 2020-06-19 NOTE — Progress Notes (Signed)
Carelink Summary Report / Loop Recorder 

## 2020-06-20 ENCOUNTER — Encounter (HOSPITAL_COMMUNITY)
Admission: RE | Admit: 2020-06-20 | Discharge: 2020-06-20 | Disposition: A | Discharge status: Home / Self Care | Payer: Medicare Other | Source: Ambulatory Visit | Attending: Emergency Medicine | Admitting: Emergency Medicine

## 2020-06-20 ENCOUNTER — Other Ambulatory Visit: Payer: Self-pay

## 2020-06-20 VITALS — Wt 146.6 lb

## 2020-06-20 DIAGNOSIS — D860 Sarcoidosis of lung: Secondary | ICD-10-CM | POA: Diagnosis present

## 2020-06-20 NOTE — Progress Notes (Signed)
Daily Session Note  Patient Details  Name: Kristen Suarez MRN: [id number] Date of Birth: 1945/04/24 Referring Provider:   April Manson Pulmonary Rehab Walk Test from 03/31/2020 in [place]  Referring Provider Dr. Lamonte Sakai      Encounter Date: 06/20/2020  Check In:  Session Check In - 06/20/20 1312      Check-In   Supervising physician immediately available to respond to emergencies [facility] immediately available    Physician(s) Dr. Tawanna Solo    Location [facility]    Staff Present Maurice Small, RN, BSN;Ryen Rhames Ysidro Evert, RN;Jessica Hassell Done, MS, ACSM-CEP, Exercise Physiologist    Virtual Visit No    Medication changes reported     No    Fall or balance concerns reported    No    Tobacco Cessation No Change    Warm-up and Cool-down Performed on first and last piece of equipment    Resistance Training Performed Yes    VAD Patient? No    PAD/SET Patient? No      Pain Assessment   Currently in Pain? No/denies    Pain Score 0-No pain    Multiple Pain Sites No           Capillary Blood Glucose: No results found for this or any previous visit (from the past 24 hour(s)).   Exercise Prescription Changes - 06/20/20 1500      Response to Exercise   Blood Pressure (Admit) 156/82    Blood Pressure (Exercise) 140/80    Blood Pressure (Exit) 128/74    Heart Rate (Admit) 82 bpm    Heart Rate (Exercise) 106 bpm    Heart Rate (Exit) 89 bpm    Oxygen Saturation (Admit) 98 %    Oxygen Saturation (Exercise) 95 %    Oxygen Saturation (Exit) 95 %    Rating of Perceived Exertion (Exercise) 12    Perceived Dyspnea (Exercise) 1    Duration Progress to 30 minutes of  aerobic without signs/symptoms of physical distress    Intensity THRR unchanged      Progression   Progression Continue to progress workloads to maintain intensity without signs/symptoms of physical distress.      Resistance Training   Training Prescription Yes     Weight blue bands    Reps 10-15    Time 10 Minutes      NuStep   Level 4    SPM 80    Minutes 15    METs 2.2      Track   Laps 15    Minutes 15           Social History   Tobacco Use  Smoking Status Never Smoker  Smokeless Tobacco Never Used    Goals Met:  Exercise tolerated well No report of cardiac concerns or symptoms Strength training completed today  Goals Unmet:  Not Applicable  Comments: Service time is from 1300 to 1405    Dr. Fransico Him is Medical Director for Cardiac Rehab at [facility].

## 2020-06-22 ENCOUNTER — Other Ambulatory Visit: Payer: Self-pay

## 2020-06-22 ENCOUNTER — Encounter (HOSPITAL_COMMUNITY)
Admission: RE | Admit: 2020-06-22 | Discharge: 2020-06-22 | Disposition: A | Discharge status: Home / Self Care | Payer: Medicare Other | Source: Ambulatory Visit | Attending: Emergency Medicine | Admitting: Emergency Medicine

## 2020-06-22 DIAGNOSIS — D860 Sarcoidosis of lung: Secondary | ICD-10-CM

## 2020-06-22 NOTE — Progress Notes (Signed)
Daily Session Note  Patient Details  Name: Kristen Suarez MRN: [id number] Date of Birth: [birth year] Referring Provider:   April Manson Pulmonary Rehab Walk Test from 03/31/2020 in [place]  Referring Provider Dr. Lamonte Sakai      Encounter Date: 06/22/2020  Check In:  Session Check In - 06/22/20 1323      Check-In   Supervising physician immediately available to respond to emergencies [facility] immediately available    Physician(s) Dr. Pricilla Loveless    Location [facility]    Staff Present Rosebud Poles, RN, BSN;Lisa Ysidro Evert, RN;Malyssa Maris Hassell Done, MS, ACSM-CEP, Exercise Physiologist    Virtual Visit No    Medication changes reported     No    Fall or balance concerns reported    No    Tobacco Cessation No Change    Warm-up and Cool-down Not performed (comment)    Resistance Training Performed No    VAD Patient? No    PAD/SET Patient? No      Pain Assessment   Currently in Pain? No/denies    Multiple Pain Sites No           Capillary Blood Glucose: No results found for this or any previous visit (from the past 24 hour(s)).    Social History   Tobacco Use  Smoking Status Never Smoker  Smokeless Tobacco Never Used    Goals Met:  Proper associated with RPD/PD & O2 Sat Improved SOB with ADL's Exercise tolerated well Patient completed pulmonary rehab program today  Goals Unmet:  Not Applicable  Comments: Service time is from 1300 to 1330    Dr. Fransico Him is Medical Director for Cardiac Rehab at [facility].

## 2020-06-27 ENCOUNTER — Other Ambulatory Visit: Payer: Self-pay

## 2020-06-27 ENCOUNTER — Other Ambulatory Visit (HOSPITAL_BASED_OUTPATIENT_CLINIC_OR_DEPARTMENT_OTHER): Payer: Self-pay

## 2020-06-27 ENCOUNTER — Ambulatory Visit: Payer: Medicare Other | Attending: Internal Medicine

## 2020-06-27 DIAGNOSIS — Z23 Encounter for immunization: Secondary | ICD-10-CM

## 2020-06-27 NOTE — Progress Notes (Signed)
   Covid-19 Vaccination Clinic  Name:  Kristen Suarez    MRN: [id number] DOB: 1945-12-16  06/27/2020  [name] was observed post Covid-19 immunization for 15 minutes without incident. She was provided with Vaccine Information Sheet and instruction to access the V-Safe system.   [name] was instructed to call 911 with any severe reactions post vaccine: Marland Kitchen Difficulty breathing  . Swelling of face and throat  . A fast heartbeat  . A bad rash all over body  . Dizziness and weakness   Immunizations Administered    Name Date Dose VIS Date Route   PFIZER Comrnaty(Gray TOP) Covid-19 Vaccine 06/27/2020  9:40 AM 0.3 mL 02/24/2020 Intramuscular   Manufacturer: [employer]   Lot: [id number]   NDC: [id number]

## 2020-06-28 ENCOUNTER — Encounter: Payer: Medicare Other | Admitting: Physical Therapy

## 2020-06-30 ENCOUNTER — Other Ambulatory Visit (HOSPITAL_BASED_OUTPATIENT_CLINIC_OR_DEPARTMENT_OTHER): Payer: Self-pay

## 2020-06-30 DIAGNOSIS — C4491 Basal cell carcinoma of skin, unspecified: Secondary | ICD-10-CM | POA: Insufficient documentation

## 2020-06-30 HISTORY — PX: BASAL CELL CARCINOMA EXCISION: SHX1214

## 2020-06-30 MED ORDER — COVID-19 MRNA VACCINE (PFIZER) 30 MCG/0.3ML IM SUSP
INTRAMUSCULAR | 0 refills | Status: DC
  Filled 2020-06-30: qty 0.3, 1d supply, fill #0

## 2020-07-04 NOTE — Addendum Note (Signed)
Encounter addended by: George Ina, RD on: 07/04/2020 12:09 PM  Actions taken: Flowsheet data copied forward, Flowsheet accepted

## 2020-07-12 ENCOUNTER — Ambulatory Visit: Payer: Medicare Other | Admitting: Physical Therapy

## 2020-07-13 NOTE — Progress Notes (Signed)
Discharge Progress Report  Patient Details  Name: Kristen Suarez MRN: [id number] Date of Birth: 12-05-1945 Referring Provider:   April Manson Pulmonary Rehab Walk Test from 03/31/2020 in [place]  Referring Provider Dr. Lamonte Sakai       Number of Visits: 18 Reason for Discharge:  Patient reached a stable level of exercise. Patient independent in their exercise. Patient has met program and personal goals.  Smoking History:  Social History   Tobacco Use  Smoking Status Never Smoker  Smokeless Tobacco Never Used    Diagnosis:  Sarcoidosis of lung ([place])  ADL [facility]:  Pulmonary Assessment Scores    Row Name 03/31/20 1104 06/22/20 0805 06/22/20 1549     ADL UCSD   ADL Phase Entry Exit --   SOB Score total 20 47 --     CAT Score   CAT Score 15 19 --     mMRC Score   mMRC Score 1 -- 1          Initial Exercise Prescription:  Initial Exercise Prescription - 03/31/20 1100      Date of Initial Exercise RX and Referring Provider   Date 03/31/20    Referring Provider Dr. Lamonte Sakai    Expected Discharge Date 06/01/20      NuStep   Level 2    SPM 80    Minutes 15      Track   Minutes 15      Prescription Details   Frequency (times per week) 2    Duration Progress to 30 minutes of continuous aerobic without signs/symptoms of physical distress      Intensity   THRR 40-80% of Max Heartrate 58-117    Ratings of Perceived Exertion 11-13    Perceived Dyspnea 0-4      Progression   Progression Continue to progress workloads to maintain intensity without signs/symptoms of physical distress.      Resistance Training   Training Prescription Yes    Weight Orange bands    Reps 10-15           Discharge Exercise Prescription (Final Exercise Prescription Changes):  Exercise Prescription Changes - 06/20/20 1500      Response to Exercise   Blood Pressure (Admit) 156/82    Blood Pressure (Exercise) 140/80    Blood Pressure (Exit) 128/74     Heart Rate (Admit) 82 bpm    Heart Rate (Exercise) 106 bpm    Heart Rate (Exit) 89 bpm    Oxygen Saturation (Admit) 98 %    Oxygen Saturation (Exercise) 95 %    Oxygen Saturation (Exit) 95 %    Rating of Perceived Exertion (Exercise) 12    Perceived Dyspnea (Exercise) 1    Duration Progress to 30 minutes of  aerobic without signs/symptoms of physical distress    Intensity THRR unchanged      Progression   Progression Continue to progress workloads to maintain intensity without signs/symptoms of physical distress.      Resistance Training   Training Prescription Yes    Weight blue bands    Reps 10-15    Time 10 Minutes      NuStep   Level 4    SPM 80    Minutes 15    METs 2.2      Track   Laps 15    Minutes 15           Functional Capacity:  6 Minute Walk    Row Name 03/31/20  1100 06/22/20 1546       6 Minute Walk   Phase Initial Discharge    Distance 1272 feet 1313 feet    Distance % Change -- 3.22 %    Distance Feet Change -- 41 ft    Walk Time 6 minutes 6 minutes    # of Rest Breaks 0 0    MPH 2.41 2.49    METS 3.35 3.44    RPE 13 13    Perceived Dyspnea  1 1    VO2 Peak 11.72 12.04    Symptoms No No    Resting HR 99 bpm 92 bpm    Resting BP 162/86 156/80    Resting Oxygen Saturation  96 % 98 %    Exercise Oxygen Saturation  during 6 min walk 92 % 92 %    Max Ex. HR 117 bpm 120 bpm    Max Ex. BP 190/90 190/76    2 Minute Post BP 186/92 156/72         Interval HR   1 Minute HR 106 105    2 Minute HR 109 110    3 Minute HR 113 115    4 Minute HR 114 118    5 Minute HR 117 120    6 Minute HR 114 119    2 Minute Post HR 97 99    Interval Heart Rate? Yes --         Interval Oxygen   Interval Oxygen? Yes Yes    Baseline Oxygen Saturation % 96 % 98 %    1 Minute Oxygen Saturation % 94 % 97 %    1 Minute Liters of Oxygen 0 L 0 L    2 Minute Oxygen Saturation % 94 % 94 %    2 Minute Liters of Oxygen 0 L 0 L    3 Minute Oxygen Saturation % 94  % 93 %    3 Minute Liters of Oxygen 0 L 0 L    4 Minute Oxygen Saturation % 93 % 92 %    4 Minute Liters of Oxygen 0 L 0 L    5 Minute Oxygen Saturation % 92 % 92 %    5 Minute Liters of Oxygen 0 L 0 L    6 Minute Oxygen Saturation % 93 % 92 %    6 Minute Liters of Oxygen 0 L 0 L    2 Minute Post Oxygen Saturation % 97 % 98 %    2 Minute Post Liters of Oxygen 0 L 0 L           Psychological, QOL, Others - Outcomes: PHQ 2/9: Depression screen [facility] 2/9 06/22/2020 03/31/2020 02/08/2020 04/21/2019 04/14/2019  Decreased Interest 0 1 0 0 0  Down, Depressed, Hopeless 0 1 0 0 0  PHQ - 2 Score 0 2 0 0 0  Altered sleeping 0 0 - - -  Tired, decreased energy 0 3 - - -  Change in appetite 0 0 - - -  Feeling bad or failure about yourself  0 0 - - -  Trouble concentrating 0 0 - - -  Moving slowly or fidgety/restless 0 0 - - -  Suicidal thoughts 0 0 - - -  PHQ-9 Score 0 5 - - -  Difficult doing work/chores Not difficult at all Not difficult at all - - -    Quality of Life:   Personal Goals: Goals established at orientation with interventions provided to  work toward goal.  Personal Goals and Risk Factors at Admission - 03/31/20 1053      Core Components/Risk Factors/Patient Goals on Admission   Hypertension Yes    Intervention Provide education on lifestyle modifcations including regular physical activity/exercise, weight management, moderate sodium restriction and increased consumption of fresh fruit, vegetables, and low fat dairy, alcohol moderation, and smoking cessation.    Expected Outcomes Long Term: Maintenance of blood pressure at goal levels.    Lipids Yes    Expected Outcomes Short Term: Participant states understanding of desired cholesterol values and is compliant with medications prescribed. Participant is following exercise prescription and nutrition guidelines.;Long Term: Cholesterol controlled with medications as prescribed, with individualized exercise RX and with personalized  nutrition plan. Value goals: LDL < 23m, HDL > 40 mg.            Personal Goals Discharge:  Goals and Risk Factor Review    Row Name 04/24/20 1321 05/15/20 0855 06/12/20 1330         Core Components/Risk Factors/Patient Goals Review   Personal Goals Review Develop more efficient breathing techniques such as purse lipped breathing and diaphragmatic breathing and practicing self-pacing with activity.;Increase knowledge of respiratory medications and ability to use respiratory devices properly.;Improve shortness of breath with ADL's Develop more efficient breathing techniques such as purse lipped breathing and diaphragmatic breathing and practicing self-pacing with activity.;Increase knowledge of respiratory medications and ability to use respiratory devices properly.;Improve shortness of breath with ADL's Develop more efficient breathing techniques such as purse lipped breathing and diaphragmatic breathing and practicing self-pacing with activity.;Increase knowledge of respiratory medications and ability to use respiratory devices properly.;Improve shortness of breath with ADL's     Review FKyawill start pulmonary rehab tomorrow, 04/25/2020. FMiekais progressing well, her attendance is regular, she is exercising on the nustep and walking on the track. Fredna graduates in 2 weeks, she has progressed well, she still has difficulty with shortness of breath while walking post covid-19.  She has noticed that she can perform household chores easier though.     Expected Outcomes See admission goals. See admission goals. See admission goals.            Exercise Goals and Review:  Exercise Goals    Row Name 03/31/20 1051             Exercise Goals   Increase Physical Activity Yes       Intervention Provide advice, education, support and counseling about physical activity/exercise needs.;Develop an individualized exercise prescription for aerobic and resistive training based on initial  evaluation findings, risk stratification, comorbidities and participant's personal goals.       Expected Outcomes Short Term: Attend rehab on a regular basis to increase amount of physical activity.;Long Term: Add in home exercise to make exercise part of routine and to increase amount of physical activity.;Long Term: Exercising regularly at least 3-5 days a week.       Increase Strength and Stamina Yes       Intervention Provide advice, education, support and counseling about physical activity/exercise needs.;Develop an individualized exercise prescription for aerobic and resistive training based on initial evaluation findings, risk stratification, comorbidities and participant's personal goals.       Expected Outcomes Short Term: Increase workloads from initial exercise prescription for resistance, speed, and METs.;Short Term: Perform resistance training exercises routinely during rehab and add in resistance training at home;Long Term: Improve cardiorespiratory fitness, muscular endurance and strength as measured by increased METs and functional capacity (  6MWT)       Able to understand and use rate of perceived exertion (RPE) scale Yes       Intervention Provide education and explanation on how to use RPE scale       Expected Outcomes Short Term: Able to use RPE daily in rehab to express subjective intensity level;Long Term:  Able to use RPE to guide intensity level when exercising independently       Able to understand and use Dyspnea scale Yes       Intervention Provide education and explanation on how to use Dyspnea scale       Expected Outcomes Short Term: Able to use Dyspnea scale daily in rehab to express subjective sense of shortness of breath during exertion;Long Term: Able to use Dyspnea scale to guide intensity level when exercising independently       Knowledge and understanding of Target Heart Rate Range (THRR) Yes       Intervention Provide education and explanation of THRR including how  the numbers were predicted and where they are located for reference       Expected Outcomes Short Term: Able to state/look up THRR;Long Term: Able to use THRR to govern intensity when exercising independently;Short Term: Able to use daily as guideline for intensity in rehab       Understanding of Exercise Prescription Yes       Intervention Provide education, explanation, and written materials on patient's individual exercise prescription       Expected Outcomes Short Term: Able to explain program exercise prescription;Long Term: Able to explain home exercise prescription to exercise independently              Exercise Goals Re-Evaluation:  Exercise Goals Re-Evaluation    Row Name 04/25/20 0708 05/15/20 1330 06/13/20 1331         Exercise Goal Re-Evaluation   Exercise Goals Review Increase Physical Activity;Increase Strength and Stamina;Able to understand and use rate of perceived exertion (RPE) scale;Able to understand and use Dyspnea scale;Knowledge and understanding of Target Heart Rate Range (THRR);Understanding of Exercise Prescription Increase Physical Activity;Increase Strength and Stamina;Able to understand and use rate of perceived exertion (RPE) scale;Able to understand and use Dyspnea scale;Knowledge and understanding of Target Heart Rate Range (THRR);Understanding of Exercise Prescription Increase Physical Activity;Increase Strength and Stamina;Able to understand and use rate of perceived exertion (RPE) scale;Able to understand and use Dyspnea scale;Knowledge and understanding of Target Heart Rate Range (THRR);Understanding of Exercise Prescription     Comments Patient has not had her first exercise session due to being out with COVID. Patient is set to return today for 1st exercise session. Will continue to monitor and progress as she is able. Mayumi has completed 6 exercise sessions and has tolerated exercise well so far. She has made some progression with MET increases. She is  becoming independent with all exercises and is able to do everything. She is currently exercising at 2.1 METS on the Nustep and 2.74 METS walking the track. Will continue to monitor and progress as she is able. Finesse has completed 15 exercise sessions and is scheduled to graduate from the program next week. She has made great progression throughout the program. She is exercising at 2.3 METS on the Nustep and 2.86 METS on the track. She plans to continue exercise at home once she graduates from the program. Will follow up on home exercise prescription before she graduates. Will continue to monitor and progress as she is able.  Expected Outcomes Through exercise at rehab and home the patient will decrease shortness of breath with daily activities and feel confident in carrying out an exercise regimn at home. Through exercise at rehab and home the patient will decrease shortness of breath with daily activities and feel confident in carrying out an exercise regimn at home. Through exercise at rehab and home the patient will decrease shortness of breath with daily activities and feel confident in carrying out an exercise regimn at home.            Nutrition & Weight - Outcomes:  Pre Biometrics - 03/31/20 1051      Pre Biometrics   Grip Strength 17 kg           Post Biometrics - 06/22/20 1318       Post  Biometrics   Grip Strength 25.5 kg           Nutrition:  Nutrition Therapy & Goals - 05/04/20 1427      Nutrition Therapy   Diet Generally healthy      Personal Nutrition Goals   Nutrition Goal Pt to continue to  build a healthy plate including vegetables, fruits, whole grains, and low-fat dairy products in a heart healthy meal plan.      Intervention Plan   Intervention Prescribe, educate and counsel regarding individualized specific dietary modifications aiming towards targeted core components such as weight, hypertension, lipid management, diabetes, heart failure and other  comorbidities.    Expected Outcomes Short Term Goal: Understand basic principles of dietary content, such as calories, fat, sodium, cholesterol and nutrients.           Nutrition Discharge:   Education Questionnaire Score:  Knowledge Questionnaire Score - 06/22/20 0804      Knowledge Questionnaire Score   Post Score 15/18           Goals reviewed with patient; copy given to patient.

## 2020-07-14 ENCOUNTER — Other Ambulatory Visit: Payer: Self-pay

## 2020-07-14 ENCOUNTER — Encounter: Payer: Self-pay | Admitting: Nurse Practitioner

## 2020-07-17 ENCOUNTER — Ambulatory Visit (INDEPENDENT_AMBULATORY_CARE_PROVIDER_SITE_OTHER): Payer: Medicare Other

## 2020-07-17 DIAGNOSIS — R55 Syncope and collapse: Secondary | ICD-10-CM

## 2020-07-18 LAB — CUP PACEART REMOTE DEVICE CHECK
Date Time Interrogation Session: 20220501001947
Implantable Pulse Generator Implant Date: 20190109

## 2020-07-20 ENCOUNTER — Telehealth: Payer: Self-pay | Admitting: Nurse Practitioner

## 2020-07-20 ENCOUNTER — Other Ambulatory Visit: Payer: Self-pay

## 2020-07-20 DIAGNOSIS — I10 Essential (primary) hypertension: Secondary | ICD-10-CM

## 2020-07-20 MED ORDER — HYDRALAZINE HCL 10 MG PO TABS: 10.0000 mg | ORAL_TABLET | Freq: Two times a day (BID) | ORAL | 5 refills | Status: DC

## 2020-07-20 NOTE — Telephone Encounter (Signed)
Patient notified and verbalized understanding Pt states she has changed her pharmacy and would like medication sent to updated pharmacy.

## 2020-07-20 NOTE — Progress Notes (Signed)
Office Visit Note  Patient: Kristen Suarez             Date of Birth: 1945/03/26           MRN: [id number]             PCP: Flossie Buffy, NP Referring: Flossie Buffy, NP Visit Date: 08/02/2020 Occupation: @GUAROCC @  Subjective:  Joint stiffness.   History of Present Illness: Kristen Suarez is a 75 y.o. female with history of osteoarthritis, sarcoidosis and osteopenia.  According the patient she has had few episodes of bronchitis in the last year.  She had CT scan of her chest by Dr. Lamonte Sakai which was a stable.  She states states that she also stopped the supplements per Dr. Agustina Caroli suggestion as he was concerned that may be causing reflux symptoms.  She continues to have some stiffness in her hands and her feet but she has not noticed any joint swelling.  She has been doing some of the exercises for her hands.  Activities of Daily Living:  Patient reports morning stiffness for 30  minutes.   Patient Denies nocturnal pain.  Difficulty dressing/grooming: Denies Difficulty climbing stairs: Reports Difficulty getting out of chair: Reports Difficulty using hands for taps, buttons, cutlery, and/or writing: Reports  Review of Systems  Constitutional: Positive for fatigue.  HENT: Positive for mouth dryness and nose dryness. Negative for mouth sores.   Eyes: Positive for dryness. Negative for pain and itching.  Respiratory: Positive for shortness of breath. Negative for difficulty breathing.   Cardiovascular: Negative for chest pain and palpitations.  Gastrointestinal: Negative for blood in stool, constipation and diarrhea.  Endocrine: Negative for increased urination.  Genitourinary: Negative for difficulty urinating.  Musculoskeletal: Positive for arthralgias, joint pain and morning stiffness. Negative for joint swelling, myalgias, muscle tenderness and myalgias.  Skin: Negative for color change, rash and redness.  Allergic/Immunologic: Negative for susceptible to  infections.  Neurological: Negative for dizziness, numbness, headaches, memory loss and weakness.  Hematological: Positive for bruising/bleeding tendency.  Psychiatric/Behavioral: Negative for confusion.    PMFS History:  Patient Active Problem List   Diagnosis Date Noted  . History of loop recorder 12/13/2019  . Allergic rhinitis 12/08/2019  . Cough 11/08/2019  . Post-nasal drip 11/08/2019  . Multiple joint pain 04/22/2019  . Bilateral leg edema 04/22/2019  . Vaginal atrophy 01/05/2019  . Resting tremor 09/08/2018  . Sarcoidosis 05/08/2018  . HTN (hypertension), benign 05/08/2018  . Colon polyp 05/08/2018  . Family hx of colon cancer 05/08/2018  . Cystocele, midline 05/08/2018  . Hyperlipidemia 05/08/2018  . Vasovagal syncope 05/08/2018  . Seborrheic keratosis 05/08/2018  . Pseudophakia of both eyes 05/08/2018  . GERD (gastroesophageal reflux disease) 05/08/2018  . COPD (chronic obstructive pulmonary disease) ([place]) 05/08/2018    Past Medical History:  Diagnosis Date  . Arthritis   . Asthma   . Chickenpox   . Colon polyps 2010  . Heart murmur   . History of fainting spells of unknown cause   . Hyperlipidemia   . Hypertension   . Sarcoid 06/16/1976    Family History  Problem Relation Age of Onset  . Arthritis Mother   . Cancer Mother   . Depression Mother   . Heart attack Mother   . Hyperlipidemia Mother   . Hypertension Mother   . Alcohol abuse Father   . Heart disease Father   . Hypertension Father   . Hyperlipidemia Father   . Heart disease Sister   .  Stroke Sister   . Alcohol abuse Brother   . Heart disease Brother   . Hypertension Brother   . Hyperlipidemia Brother   . Multiple sclerosis Daughter   . Hyperlipidemia Son   . Heart disease Maternal Grandfather   . Heart disease Paternal Grandfather   . Hyperlipidemia Sister   . Alcohol abuse Sister   . Hyperlipidemia Sister   . Hypertension Sister   . Alcohol abuse Brother   . Early death Brother    . Heart attack Brother   . Heart disease Brother   . Hyperlipidemia Brother    Past Surgical History:  Procedure Laterality Date  . ABDOMINAL HYSTERECTOMY  1987  . BASAL CELL CARCINOMA EXCISION  06/30/2020   nose  . LUNG BIOPSY  06/16/1976   Social History   Social History Narrative  . Not on file   Immunization History  Administered Date(s) Administered  . Fluad Quad(high Dose 65+) 11/19/2018  . Influenza, High Dose Seasonal PF 12/11/2017  . Influenza-Unspecified 11/24/2019, 11/24/2019  . PFIZER Comirnaty(Gray Top)Covid-19 Tri-Sucrose Vaccine 06/27/2020  . PFIZER(Purple Top)SARS-COV-2 Vaccination 05/14/2019, 06/08/2019, 12/20/2019  . Pneumococcal Conjugate-13 03/23/2014  . Pneumococcal Polysaccharide-23 03/24/2012  . Tdap 03/24/2012     Objective: Vital Signs: BP (!) 162/91 (BP Location: Left Arm, Patient Position: Sitting, Cuff Size: Normal)   Pulse 94   Resp 15   Ht 5\' 3"  (1.6 m)   Wt 147 lb 12.8 oz (67 kg)   BMI 26.18 kg/m    Physical Exam Vitals and nursing note reviewed.  Constitutional:      Appearance: She is well-developed.  HENT:     Head: Normocephalic and atraumatic.  Eyes:     Conjunctiva/sclera: Conjunctivae normal.  Cardiovascular:     Rate and Rhythm: Normal rate and regular rhythm.     Heart sounds: Normal heart sounds.  Pulmonary:     Effort: Pulmonary effort is normal.     Breath sounds: Normal breath sounds.  Abdominal:     General: Bowel sounds are normal.     Palpations: Abdomen is soft.  Musculoskeletal:     Cervical back: Normal range of motion.  Lymphadenopathy:     Cervical: No cervical adenopathy.  Skin:    General: Skin is warm and dry.     Capillary Refill: Capillary refill takes less than 2 seconds.  Neurological:     Mental Status: She is alert and oriented to person, place, and time.  Psychiatric:        Behavior: Behavior normal.      Musculoskeletal Exam: C-spine was in good range of motion.  Shoulder joints,  elbow joints, wrist joints with good range of motion.  She has bilateral PIP and DIP thickening with incomplete fist formation.  She has limited extension or some of the PIP joints.  Hip joints and knee joints in good range of motion.  She had no tenderness over ankles or MTPs.  CDAI Exam: CDAI Score: -- Patient Global: --; Provider Global: -- Swollen: --; Tender: -- Joint Exam 08/02/2020   No joint exam has been documented for this visit   There is currently no information documented on the homunculus. Go to the Rheumatology activity and complete the homunculus joint exam.  Investigation: No additional findings.  Imaging: CUP PACEART REMOTE DEVICE CHECK  Result Date: 07/18/2020 ILR summary report received. Battery status OK. Normal device function. No new symptom, tachy, brady, or pause episodes. No new AF episodes. Monthly summary reports and ROV/PRN Kathy Breach,  RN, CCDS, [employer]   Recent Labs: Lab Results  Component Value Date   WBC 6.4 02/29/2020   HGB 13.9 02/29/2020   PLT 313.0 02/29/2020   NA 140 07/28/2020   K 3.8 07/28/2020   CL 102 07/28/2020   CO2 29 07/28/2020   GLUCOSE 102 (H) 07/28/2020   BUN 21 07/28/2020   CREATININE 1.06 07/28/2020   BILITOT 0.7 07/28/2020   ALKPHOS 101 07/28/2020   AST 24 07/28/2020   ALT 24 07/28/2020   PROT 6.8 07/28/2020   ALBUMIN 4.5 07/28/2020   CALCIUM 9.5 07/28/2020   GFRAA 57 (L) 05/05/2019    Speciality Comments: No specialty comments available.  Procedures:  No procedures performed Allergies: Penicillins, Sulfa antibiotics, and Keflex [cephalexin]   Assessment / Plan:     Visit Diagnoses: Sarcoidosis - patient was diagnosed with sarcoidosis at age 89 and was treated with prednisone for 15 years.She states she has been followed by Dr. Lamonte Sakai.  Patient had CT scan of the chest on December 28, 2019 which did not show any radiographic progression.  She has had few episodes of bronchitis but not a sarcoidosis  flare.  Primary osteoarthritis of both hands-she has no inflammatory component.  Joint protection and muscle strengthening was discussed.  Primary osteoarthritis of both feet-proper fitting shoes were discussed.  Postural kyphosis of thoracic region-she has significant postural kyphosis.  Back exercises were demonstrated and discussed.  Osteopenia-The BMD measured at Femur Neck Right is 0.725 g/cm2 with a T-score of -2.2.  Use of calcium with vitamin D and resistive exercise were emphasized.  Other medical problems are listed as follows:  HTN (hypertension), benign  History of gastroesophageal reflux (GERD)  Hx of colonic polyps  History of hyperlipidemia  Chronic bronchitis, unspecified chronic bronchitis type (HCC)  Seborrheic keratosis  Resting tremor  Pseudophakia of both eyes  Family history of multiple sclerosis  Orders: No orders of the defined types were placed in this encounter.  No orders of the defined types were placed in this encounter.    Follow-Up Instructions: Return in about 1 year (around 08/02/2021) for Sarcoidosis, Osteoarthritis.   Bo Merino, MD  Note - This record has been created using Editor, commissioning.  Chart creation errors have been sought, but may not always  have been located. Such creation errors do not reflect on  the standard of medical care.

## 2020-07-20 NOTE — Telephone Encounter (Signed)
With elevated BP reading per Home readings, I add hydralazine 10mg  BID if BP :150/90. Maintain amlodipine and avapro dose Maintain upcoming appt with cardiology 06/15/2020.

## 2020-07-21 ENCOUNTER — Encounter: Payer: Self-pay | Admitting: Nurse Practitioner

## 2020-07-26 ENCOUNTER — Encounter: Payer: Self-pay | Admitting: Physical Therapy

## 2020-07-26 ENCOUNTER — Other Ambulatory Visit: Payer: Self-pay

## 2020-07-26 ENCOUNTER — Ambulatory Visit: Payer: Medicare Other | Attending: Obstetrics and Gynecology | Admitting: Physical Therapy

## 2020-07-26 DIAGNOSIS — R278 Other lack of coordination: Secondary | ICD-10-CM | POA: Diagnosis present

## 2020-07-26 DIAGNOSIS — M6281 Muscle weakness (generalized): Secondary | ICD-10-CM | POA: Diagnosis present

## 2020-07-26 NOTE — Therapy (Signed)
[facility] [address], [place], [place], [address] Phone: [phone]   Fax:  [phone]  Physical Therapy Treatment  Patient Details  Name: Kristen Suarez MRN: [id number] Date of Birth: 31-Aug-1945 Referring Provider (PT): Dr. Sherlene Shams   Encounter Date: 07/26/2020   PT End of Session - 07/26/20 1219    Visit Number 5    Date for PT Re-Evaluation 07/26/20    Authorization Type medicare    Authorization - Visit Number 5    Authorization - Number of Visits 10    PT Start Time 9678    PT Stop Time 1223    PT Time Calculation (min) 38 min    Activity Tolerance Patient tolerated treatment well    Behavior During Therapy [facility] for tasks assessed/performed           Past Medical History:  Diagnosis Date  . Arthritis   . Asthma   . Chickenpox   . Colon polyps 2010  . Heart murmur   . History of fainting spells of unknown cause   . Hyperlipidemia   . Hypertension   . Sarcoid 06/16/1976    Past Surgical History:  Procedure Laterality Date  . ABDOMINAL HYSTERECTOMY  1987  . LUNG BIOPSY  06/16/1976    There were no vitals filed for this visit.   Subjective Assessment - 07/26/20 1148    Subjective I have been released to do light exercise now since my surgery for my nose. My blood pressure has been very high. I had to times of urinary leakage. I feel stonger. Ihave not had discomfort with the prolapse.    Patient Stated Goals I want to get to a place to maintain myself    Currently in Pain? 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 Sherlene Shams    Onset Date/Surgical Date --   2017   Prior Therapy yes in [place]      Precautions   Precautions Other (comment)    Precaution Comments pulmonary sarcoid       Restrictions   Weight Bearing Restrictions No      Home Environment   Living Environment Private residence      Prior Function   Level of Independence Independent      Cognition   Overall Cognitive Status Within Functional Limits for tasks assessed      Posture/Postural Control   Posture/Postural Control No significant limitations      PROM   Right Hip External Rotation  55    Right Hip Internal Rotation  20    Left Hip External Rotation  50    Left Hip Internal Rotation  20      Strength   Right Hip Extension 4/5    Right Hip ABduction 4/5    Left Hip Extension 4/5    Left Hip ABduction 4/5      Palpation   SI assessment  ASIS equal    Palpation comment able to contract the upper and lower abdominals on cue and breath                      Pelvic Floor Special Questions - 07/26/20 0001    Pad use none  Urinary frequency 1 time    Skin Integrity Intact    Prolapse Anterior Wall;Posterior Wall;Uterine    Pelvic Floor Internal Exam Patient confirms identification and approves PT to assess pelvic floor and treatment    Exam Type Vaginal    Strength fair squeeze, definite lift    Strength # of seconds 5             [facility] Adult PT Treatment/Exercise - 07/26/20 0001      Self-Care   Self-Care Other Self-Care Comments    Other Self-Care Comments  discussed with patient onhow to progress herself at the gym with  not using no more than 2#, breath with with the hardest part of the exercise, no curl ups and start slowly      Exercises   Exercises Other Exercises    Other Exercises  verbally went over patient home exercise program and which ones she is doing, on how to progress slowly due ot restricted adctivity for 4 weeks because she had skin cancer taken off her nose and how to  progress her you tube videos      Lumbar Exercises: Supine   Ab Set 10 reps;5 seconds    AB Set Limitations abdominal contraction with tactile cues to contract upper and lower  while breathing                  PT Education - 07/26/20 1222    Education Details went over what to do at the gym and how to modify    Person(s) Educated Patient    Methods Explanation    Comprehension Verbalized understanding            PT Short Term Goals - 07/26/20 1151      PT SHORT TERM GOAL #1   Title independent with initial HEP for diaphgramatic breathing and abdominal contraction    Time 12    Period Weeks    Status Achieved      PT SHORT TERM GOAL #2   Title understand ways to manage prolapse    Time 12    Period Weeks    Status Achieved             PT Long Term Goals - 07/26/20 1152      PT LONG TERM GOAL #1   Title independent with advanced HEP for core and pelvic floor strengthening    Time 12    Period Weeks    Status Achieved      PT LONG TERM GOAL #2   Title able to contract the pelvic floor >/= 3/5 strength for 6 seconds with correct abdominal contraction    Time 12    Period Weeks    Status Achieved      PT LONG TERM GOAL #3   Title understand how to deter the urge to void at night to reduce her toileting to 1-2 times per night    Baseline gets up 1 time per night    Time 12    Period Weeks    Status Achieved      PT LONG TERM GOAL #4   Title able to not wear a depends due to urinaty leakage decreased >/= 75% due to improve pelvic floor and abdominal contractions    Baseline does not wear a pad    Time 12    Period Weeks    Status Achieved  Plan - 07/26/20 1147    Clinical Impression Statement Patient is going to the bathroom 1 time at night. Patient does not wear a pad. Patient has increased strength of bilateral hips. Pelvic floor strength 3/5. Patient has prolapse aove the introitus. Patient is able to contract the upper and lower abdominals. Patient is able to deter the urge to void. Patient is independent with her HEP. Patient to modify her program due to having skin cancer removed from her nose.     Personal Factors and Comorbidities Comorbidity 3+    Comorbidities Abdominal hysterectomy 1987; Sarcoid; prolapse anterior and posterior wall    Examination-Activity Limitations Continence    Stability/Clinical Decision Making Evolving/Moderate complexity    Rehab Potential Excellent    PT Frequency 1x / week    PT Duration 2 weeks    PT Treatment/Interventions ADLs/Self Care Home Management;Biofeedback;Cryotherapy;Electrical Stimulation;Moist Heat;Neuromuscular re-education;Therapeutic exercise;Therapeutic activities;Patient/family education;Manual techniques;Passive range of motion;Joint Manipulations;Spinal Manipulations    PT Next Visit Plan discharge to HEP, had one visit to update HEP    PT Home Exercise Plan Access Code: [email or web address]    Recommended Other Services sent MD renewal    Consulted and Agree with Plan of Care Patient           Patient will benefit from skilled therapeutic intervention in order to improve the following deficits and impairments:  Decreased coordination,Decreased range of motion,Increased fascial restricitons,Increased muscle spasms,Decreased activity tolerance,Decreased mobility,Decreased strength  Visit Diagnosis: Muscle weakness (generalized) - Plan: PT plan of care cert/re-cert  Other lack of coordination - Plan: PT plan of care cert/re-cert     Problem List Patient Active Problem List   Diagnosis Date Noted  . History of loop recorder 12/13/2019  . Allergic rhinitis 12/08/2019  . Cough 11/08/2019  . Post-nasal drip 11/08/2019  . Multiple joint pain 04/22/2019  . Bilateral leg edema 04/22/2019  . Vaginal atrophy 01/05/2019  . Resting tremor 09/08/2018  . Sarcoidosis 05/08/2018  . HTN (hypertension), benign 05/08/2018  . Colon polyp 05/08/2018  . Family hx of colon cancer 05/08/2018  . Cystocele, midline 05/08/2018  . Hyperlipidemia 05/08/2018  . Vasovagal syncope 05/08/2018  . Seborrheic keratosis 05/08/2018  . Pseudophakia of both eyes  05/08/2018  . GERD (gastroesophageal reflux disease) 05/08/2018  . COPD (chronic obstructive pulmonary disease) ([place]) 05/08/2018    Earlie Counts, PT 07/26/20 12:31 PM   [facility] [address], [place], [place], [address] Phone: [phone]   Fax:  [phone]  Name: Kristen Suarez MRN: [id number] Date of Birth: 1945-11-18 PHYSICAL THERAPY DISCHARGE SUMMARY  Visits from Start of Care: 4  Current functional level related to goals / functional outcomes: See above.    Remaining deficits: See above.    Education / Equipment: HEP Plan: Patient agrees to discharge.  Patient goals were met. Patient is being discharged due to meeting the stated rehab goals.  Thank you for the referral. Earlie Counts, PT 07/26/20 12:32 PM  ?????

## 2020-07-27 ENCOUNTER — Other Ambulatory Visit: Payer: Self-pay

## 2020-07-28 ENCOUNTER — Ambulatory Visit (INDEPENDENT_AMBULATORY_CARE_PROVIDER_SITE_OTHER): Payer: Medicare Other | Admitting: Emergency Medicine

## 2020-07-28 ENCOUNTER — Encounter: Payer: Self-pay | Admitting: Nurse Practitioner

## 2020-07-28 ENCOUNTER — Encounter: Payer: Self-pay | Admitting: Emergency Medicine

## 2020-07-28 ENCOUNTER — Other Ambulatory Visit: Payer: Self-pay

## 2020-07-28 ENCOUNTER — Other Ambulatory Visit (HOSPITAL_BASED_OUTPATIENT_CLINIC_OR_DEPARTMENT_OTHER): Payer: Self-pay

## 2020-07-28 ENCOUNTER — Ambulatory Visit (INDEPENDENT_AMBULATORY_CARE_PROVIDER_SITE_OTHER): Payer: Medicare Other | Admitting: Nurse Practitioner

## 2020-07-28 VITALS — BP 130/74 | HR 84 | Temp 98.3°F | Wt 145.0 lb

## 2020-07-28 DIAGNOSIS — D869 Sarcoidosis, unspecified: Secondary | ICD-10-CM

## 2020-07-28 DIAGNOSIS — I10 Essential (primary) hypertension: Secondary | ICD-10-CM

## 2020-07-28 DIAGNOSIS — J42 Unspecified chronic bronchitis: Secondary | ICD-10-CM

## 2020-07-28 DIAGNOSIS — Z23 Encounter for immunization: Secondary | ICD-10-CM | POA: Diagnosis not present

## 2020-07-28 DIAGNOSIS — J301 Allergic rhinitis due to pollen: Secondary | ICD-10-CM | POA: Diagnosis not present

## 2020-07-28 DIAGNOSIS — E782 Mixed hyperlipidemia: Secondary | ICD-10-CM

## 2020-07-28 LAB — HEPATIC FUNCTION PANEL
ALT: 24 U/L (ref 0–35)
AST: 24 U/L (ref 0–37)
Albumin: 4.5 g/dL (ref 3.5–5.2)
Alkaline Phosphatase: 101 U/L (ref 39–117)
Bilirubin, Direct: 0.1 mg/dL (ref 0.0–0.3)
Total Bilirubin: 0.7 mg/dL (ref 0.2–1.2)
Total Protein: 6.8 g/dL (ref 6.0–8.3)

## 2020-07-28 LAB — BASIC METABOLIC PANEL
BUN: 21 mg/dL (ref 6–23)
CO2: 29 mEq/L (ref 19–32)
Calcium: 9.5 mg/dL (ref 8.4–10.5)
Chloride: 102 mEq/L (ref 96–112)
Creatinine, Ser: 1.06 mg/dL (ref 0.40–1.20)
GFR: 51.57 mL/min — ABNORMAL LOW (ref >60.00)
Glucose, Bld: 102 mg/dL — ABNORMAL HIGH (ref 70–99)
Potassium: 3.8 mEq/L (ref 3.5–5.1)
Sodium: 140 mEq/L (ref 135–145)

## 2020-07-28 LAB — LIPID PANEL
Cholesterol: 199 mg/dL (ref 0–200)
HDL: 60.7 mg/dL (ref >39.00)
LDL Cholesterol: 114 mg/dL — ABNORMAL HIGH (ref 0–99)
NonHDL: 137.97
Total CHOL/HDL Ratio: 3
Triglycerides: 119 mg/dL (ref 0.0–149.0)
VLDL: 23.8 mg/dL (ref 0.0–40.0)

## 2020-07-28 MED ORDER — ZOSTER VAC RECOMB ADJUVANTED 50 MCG/0.5ML IM SUSR
0.5000 mL | Freq: Once | INTRAMUSCULAR | 0 refills | Status: AC
  Filled 2020-07-28 (×2): qty 0.5, 1d supply, fill #0

## 2020-07-28 NOTE — Assessment & Plan Note (Signed)
We will plan to repeat your CT scan of the chest in October to follow your sarcoidosis

## 2020-07-28 NOTE — Progress Notes (Signed)
Subjective:  Patient ID: Kristen Suarez, female    DOB: [birth year]  Age: 75 y.o. MRN: [id number]  CC: Follow-up (3 month f/u on HTN and hyperlipidemia. /Pt is fasting./Pt has been logging BP at home and states they have been elevated. )  HPI  HTN (hypertension), benign Home BP reading: 150/80-170/90 before and after avapro 300mg  and amlodipine 10mg . She is asymptomatic. Added hydralazine 20mg  BID 1week ago. appt with cardiology: Dr. Oval Linsey  08/15/2020 BP Readings from Last 3 Encounters:  07/28/20 (!) 142/82  07/28/20 130/74  04/28/20 134/64   Repeat BMP: stable Maintain current medications and upcoming appt with cardiology.  Wt Readings from Last 3 Encounters:  07/28/20 146 lb (66.2 kg)  07/28/20 145 lb (65.8 kg)  06/20/20 146 lb 9.7 oz (66.5 kg)   Reviewed past Medical, Social and Family history today.  Outpatient Medications Prior to Visit  Medication Sig Dispense Refill  . albuterol (PROVENTIL) (2.5 MG/3ML) 0.083% nebulizer solution USE 1 VIAL VIA NEBULIZER EVERY 6 HOURS 150 mL 5  . albuterol (VENTOLIN HFA) 108 (90 Base) MCG/ACT inhaler Inhale 2 puffs into the lungs every 6 (six) hours as needed for wheezing or shortness of breath. 8 g 6  . amLODipine (NORVASC) 10 MG tablet Take 1 tablet (10 mg total) by mouth daily. 90 tablet 3  . Budeson-Glycopyrrol-Formoterol (BREZTRI AEROSPHERE) 160-9-4.8 MCG/ACT AERO Inhale 2 puffs into the lungs in the morning and at bedtime. 32.1 g 2  . CALCIUM PO Take 1 tablet by mouth daily.     Marland Kitchen COVID-19 mRNA vaccine, Pfizer, 30 MCG/0.3ML injection Inject into the muscle. 0.3 mL 0  . docusate sodium (COLACE) 100 MG capsule Take 100 mg by mouth daily.     . famotidine (PEPCID) 10 MG tablet Take 10 mg by mouth daily. PRN    . fluticasone-salmeterol (ADVAIR HFA) 115-21 MCG/ACT inhaler  (Patient not taking: Reported on 07/28/2020)    . Glycopyrrolate-Formoterol (BEVESPI AEROSPHERE) 9-4.8 MCG/ACT AERO  (Patient not taking: Reported on 07/28/2020)     . hydrALAZINE (APRESOLINE) 10 MG tablet Take 1 tablet (10 mg total) by mouth in the morning and at bedtime. If BP >150/90 60 tablet 5  . irbesartan (AVAPRO) 300 MG tablet Take 1 tablet (300 mg total) by mouth daily. 90 tablet 3  . mometasone (NASONEX) 50 MCG/ACT nasal spray Place 2 sprays into the nose daily. 1 each 3  . Multiple Vitamins-Minerals (MULTIVITAL PO) Take 1 tablet by mouth daily.     . pantoprazole (PROTONIX) 40 MG tablet TAKE 1 TABLET(40 MG) BY MOUTH DAILY 30 tablet 5  . PREVIDENT 5000 BOOSTER PLUS 1.1 % PSTE SMARTSIG:To Teeth 1 to 2 Times Daily    . Probiotic Product (PROBIOTIC DAILY PO) Take 1 tablet by mouth daily.     . simvastatin (ZOCOR) 20 MG tablet Take 1 tablet (20 mg total) by mouth daily. 90 tablet 3  . Vaginal Lubricant (REPLENS VA) Place vaginally. cream     No facility-administered medications prior to visit.    ROS See HPI  Objective:  BP 130/74 (BP Location: Left Arm, Patient Position: Sitting, Cuff Size: Normal)   Pulse 84   Temp 98.3 F (36.8 C) (Temporal)   Wt 145 lb (65.8 kg)   SpO2 98%   BMI 26.52 kg/m   Physical Exam Cardiovascular:     Rate and Rhythm: Normal rate and regular rhythm.     Pulses: Normal pulses.     Heart sounds: Murmur heard.    Pulmonary:  Effort: Pulmonary effort is normal.     Breath sounds: Normal breath sounds.  Musculoskeletal:     Right lower leg: No edema.     Left lower leg: No edema.  Neurological:     Mental Status: She is alert and oriented to person, place, and time.    Assessment & Plan:  This visit occurred during the SARS-CoV-2 public health emergency.  Safety protocols were in place, including screening questions prior to the visit, additional usage of staff PPE, and extensive cleaning of exam room while observing appropriate contact time as indicated for disinfecting solutions.   [name] was seen today for follow-up.  Diagnoses and all orders for this visit:  HTN (hypertension), benign -      Basic metabolic panel  Mixed hyperlipidemia -     Lipid panel -     Hepatic function panel  Need for varicella vaccine -     Zoster Vaccine Adjuvanted [facility]) injection; Inject 0.5 mLs into the muscle once for 1 dose.    Problem List Items Addressed This Visit      Cardiovascular and Mediastinum   HTN (hypertension), benign - Primary    Home BP reading: 150/80-170/90 before and after avapro 300mg  and amlodipine 10mg . She is asymptomatic. Added hydralazine 20mg  BID 1week ago. appt with cardiology: Dr. Oval Linsey  08/15/2020 BP Readings from Last 3 Encounters:  07/28/20 (!) 142/82  07/28/20 130/74  04/28/20 134/64   Repeat BMP: stable Maintain current medications and upcoming appt with cardiology.      Relevant Orders   Basic metabolic panel (Completed)     Other   Hyperlipidemia   Relevant Orders   Lipid panel (Completed)   Hepatic function panel (Completed)    Other Visit Diagnoses    Need for varicella vaccine       Relevant Medications   Zoster Vaccine Adjuvanted [facility]) injection      Follow-up: Return in about 6 months (around 01/28/2021) for HTN, hyperlipidemia (fasting).  Kristen Lacy, NP

## 2020-07-28 NOTE — Addendum Note (Signed)
Addended by: Gavin Potters R on: 07/28/2020 04:29 PM   Modules accepted: Orders

## 2020-07-28 NOTE — Assessment & Plan Note (Addendum)
Home BP reading: 150/80-170/90 before and after avapro 300mg  and amlodipine 10mg . She is asymptomatic. Added hydralazine 20mg  BID 1week ago. appt with cardiology: Dr. Oval Linsey  08/15/2020 BP Readings from Last 3 Encounters:  07/28/20 (!) 142/82  07/28/20 130/74  04/28/20 134/64   Repeat BMP: stable Maintain current medications and upcoming appt with cardiology.

## 2020-07-28 NOTE — Assessment & Plan Note (Signed)
Continue your Nasonex, Zyrtec as you have been taking them.

## 2020-07-28 NOTE — Progress Notes (Signed)
Subjective:    Patient ID: Kristen Suarez, female    DOB: [birth year], 75 y.o.   MRN: [id number]  HPI  ROV 01/03/20 --follow-up visit 75 year old woman who has sarcoidosis, associated COPD with severe obstruction and superimposed restriction.  She has chronic cough with allergic rhinitis, GERD.  Her cough is flaring at the beginning of the month and I asked her to start pantoprazole, continue famotidine, stop ginger and fish oil, stop lisinopril and substitute irbesartan.  She called a few days later with increased symptoms, some bronchitic symptoms and I treated her with a prednisone taper and antibiotics, hycodan.  She reports today that her breathing and cough both improved. Still a small amount of dry cough. Remains Bevespi, uses albuterol about once a day. Her nebulizer machine just broke > needs a new one.  We repeated her CT scan of the chest on 12/28/2019 which I have reviewed, shows an overall stable appearance of bronchiectatic and fibrotic change compared with 1 year ago.  Also noted were some new clustered nodularity in the anterior left upper lobe consistent with inflammatory process.  ROV 03/06/2020 >>  Kristen Suarez is 65 with a history of sarcoidosis and associated severe obstructive lung disease, also some restrictive lung disease.  She has chronic cough with allergic rhinitis and GERD.  She was seen here a month ago with flaring symptoms including dyspnea but mainly head congestion, cough.  She was changed from [place] to [place], given a prednisone taper.  She returns today reporting that the prednisone helped her head congestion. No real difference between [place]. She is interested in doing pulm rehab. She is on nasonex, on zyrtec, doing well on PPI and pepcid prn. Would be willing to try [place].   ROV 07/28/20 --75 year old woman with history of sarcoidosis and associated severe obstructive lung disease, also component of restrictive lung disease.  She has chronic cough in the  setting of this as well as allergic rhinitis and GERD. She is doing cardiopulmonary rehab.  Did not desaturate at our last office visit I continued her on Breztri at her last visit.  She has albuterol, uses 1 neb every morning, uses her HFA rarely Having a lot of eye and nasal irritation from allergies. Rare wheeze, minimal cough.  She had mild COVID in January. She is Vaccinated. She has occasionally had chills since then.  She is on nasonex, zyrtec, PPI + pepcid Due for repeat CT chest in October.    Review of Systems  Constitutional: Negative for fever and unexpected weight change.  HENT: Negative for congestion, dental problem, ear pain, nosebleeds, postnasal drip, rhinorrhea, sinus pressure, sneezing, sore throat and trouble swallowing.   Eyes: Negative for redness and itching.  Respiratory: Positive for cough and shortness of breath. Negative for chest tightness and wheezing.   Cardiovascular: Negative for palpitations and leg swelling.  Gastrointestinal: Negative for nausea and vomiting.  Genitourinary: Negative for dysuria.  Musculoskeletal: Negative for joint swelling.  Skin: Negative for rash.  Neurological: Negative for headaches.  Hematological: Does not bruise/bleed easily.  Psychiatric/Behavioral: Negative for dysphoric mood. The patient is not nervous/anxious.        Objective:   Physical Exam Vitals:   07/28/20 1518  BP: (!) 142/82  Pulse: 84  Temp: 97.7 F (36.5 C)  TempSrc: Temporal  SpO2: 97%  Weight: 146 lb (66.2 kg)  Height: 5\' 3"  (1.6 m)   Gen: Pleasant, well-nourished, in no distress,  normal affect  ENT: No lesions,  mouth clear,  oropharynx clear, no postnasal drip  Neck: No JVD, no upper airway noise  Lungs: No use of accessory muscles, good excursion, clear bilaterally  Cardiovascular: RRR, heart sounds normal, no murmur or gallops, no peripheral edema  Musculoskeletal: No deformities, no cyanosis or clubbing  Neuro: alert, awake, non  focal  Skin: Warm, no lesions or rash      Assessment & Plan:  COPD (chronic obstructive pulmonary disease) (HCC) Please continue Breztri 2 puffs twice a day.  Rinse and gargle after using. Continue your albuterol nebulizer each morning and if you need it for shortness of breath. Continue to keep your albuterol inhaler available use 2 puffs if needed for shortness of breath, chest tightness, wheezing. COVID-19 vaccine is up-to-date Congratulations on completing pulmonary rehab and your efforts to continue your exercise.  This is deftly can help your breathing.s. Follow Dr. Lamonte Sakai in October or sooner if you have any problems  Allergic rhinitis Continue your Nasonex, Zyrtec as you have been taking them.  Sarcoidosis We will plan to repeat your CT scan of the chest in October to follow your sarcoidosis  Baltazar Apo, MD, PhD 07/28/2020, 3:40 PM [facility] [phone] or if no answer [phone]

## 2020-07-28 NOTE — Patient Instructions (Signed)
Maintain current medications Go to lab for blood draw

## 2020-07-28 NOTE — Assessment & Plan Note (Signed)
Please continue Breztri 2 puffs twice a day.  Rinse and gargle after using. Continue your albuterol nebulizer each morning and if you need it for shortness of breath. Continue to keep your albuterol inhaler available use 2 puffs if needed for shortness of breath, chest tightness, wheezing. COVID-19 vaccine is up-to-date Congratulations on completing pulmonary rehab and your efforts to continue your exercise.  This is deftly can help your breathing.s. Follow Dr. Lamonte Sakai in October or sooner if you have any problems

## 2020-07-28 NOTE — Patient Instructions (Signed)
Please continue Breztri 2 puffs twice a day.  Rinse and gargle after using. Continue your albuterol nebulizer each morning and if you need it for shortness of breath. Continue to keep your albuterol inhaler available use 2 puffs if needed for shortness of breath, chest tightness, wheezing. COVID-19 vaccine is up-to-date Congratulations on completing pulmonary rehab and your efforts to continue your exercise.  This is deftly can help your breathing. Continue your Nasonex, Zyrtec as you have been taking them. We will plan to repeat your CT scan of the chest in October to follow your sarcoidosis. Follow Dr. Lamonte Sakai in October or sooner if you have any problems.

## 2020-08-02 ENCOUNTER — Ambulatory Visit (INDEPENDENT_AMBULATORY_CARE_PROVIDER_SITE_OTHER): Payer: Medicare Other | Admitting: Rheumatology

## 2020-08-02 ENCOUNTER — Encounter: Payer: Self-pay | Admitting: Rheumatology

## 2020-08-02 ENCOUNTER — Other Ambulatory Visit: Payer: Self-pay

## 2020-08-02 VITALS — BP 162/91 | HR 94 | Resp 15 | Ht 63.0 in | Wt 147.8 lb

## 2020-08-02 DIAGNOSIS — M4004 Postural kyphosis, thoracic region: Secondary | ICD-10-CM

## 2020-08-02 DIAGNOSIS — M19042 Primary osteoarthritis, left hand: Secondary | ICD-10-CM

## 2020-08-02 DIAGNOSIS — G252 Other specified forms of tremor: Secondary | ICD-10-CM

## 2020-08-02 DIAGNOSIS — M19071 Primary osteoarthritis, right ankle and foot: Secondary | ICD-10-CM | POA: Diagnosis not present

## 2020-08-02 DIAGNOSIS — M19041 Primary osteoarthritis, right hand: Secondary | ICD-10-CM | POA: Diagnosis not present

## 2020-08-02 DIAGNOSIS — D869 Sarcoidosis, unspecified: Secondary | ICD-10-CM | POA: Diagnosis not present

## 2020-08-02 DIAGNOSIS — U071 COVID-19: Secondary | ICD-10-CM

## 2020-08-02 DIAGNOSIS — Z8719 Personal history of other diseases of the digestive system: Secondary | ICD-10-CM

## 2020-08-02 DIAGNOSIS — I10 Essential (primary) hypertension: Secondary | ICD-10-CM

## 2020-08-02 DIAGNOSIS — Z8639 Personal history of other endocrine, nutritional and metabolic disease: Secondary | ICD-10-CM

## 2020-08-02 DIAGNOSIS — Z86010 Personal history of colonic polyps: Secondary | ICD-10-CM

## 2020-08-02 DIAGNOSIS — M8589 Other specified disorders of bone density and structure, multiple sites: Secondary | ICD-10-CM

## 2020-08-02 DIAGNOSIS — Z961 Presence of intraocular lens: Secondary | ICD-10-CM

## 2020-08-02 DIAGNOSIS — M19072 Primary osteoarthritis, left ankle and foot: Secondary | ICD-10-CM

## 2020-08-02 DIAGNOSIS — J42 Unspecified chronic bronchitis: Secondary | ICD-10-CM

## 2020-08-02 DIAGNOSIS — L821 Other seborrheic keratosis: Secondary | ICD-10-CM

## 2020-08-02 DIAGNOSIS — Z820 Family history of epilepsy and other diseases of the nervous system: Secondary | ICD-10-CM

## 2020-08-03 NOTE — Progress Notes (Signed)
Carelink Summary Report / Loop Recorder 

## 2020-08-09 ENCOUNTER — Encounter: Payer: Medicare Other | Admitting: Physical Therapy

## 2020-08-10 ENCOUNTER — Encounter: Payer: Self-pay | Admitting: Nurse Practitioner

## 2020-08-10 ENCOUNTER — Other Ambulatory Visit: Payer: Self-pay

## 2020-08-10 MED ORDER — SIMVASTATIN 20 MG PO TABS: 20.0000 mg | ORAL_TABLET | Freq: Every day | ORAL | 3 refills | Status: DC

## 2020-08-10 NOTE — Telephone Encounter (Signed)
Pt changed pharmacy and needs medication sent to new pharmacy.

## 2020-08-15 ENCOUNTER — Ambulatory Visit (INDEPENDENT_AMBULATORY_CARE_PROVIDER_SITE_OTHER): Payer: Medicare Other | Admitting: Cardiovascular Disease

## 2020-08-15 ENCOUNTER — Other Ambulatory Visit: Payer: Self-pay

## 2020-08-15 ENCOUNTER — Encounter: Payer: Self-pay | Admitting: Cardiovascular Disease

## 2020-08-15 VITALS — BP 150/76 | HR 85 | Ht 63.0 in | Wt 144.4 lb

## 2020-08-15 DIAGNOSIS — D869 Sarcoidosis, unspecified: Secondary | ICD-10-CM | POA: Diagnosis not present

## 2020-08-15 DIAGNOSIS — E7800 Pure hypercholesterolemia, unspecified: Secondary | ICD-10-CM | POA: Diagnosis not present

## 2020-08-15 DIAGNOSIS — I2510 Atherosclerotic heart disease of native coronary artery without angina pectoris: Secondary | ICD-10-CM

## 2020-08-15 DIAGNOSIS — I10 Essential (primary) hypertension: Secondary | ICD-10-CM

## 2020-08-15 DIAGNOSIS — Z5181 Encounter for therapeutic drug level monitoring: Secondary | ICD-10-CM | POA: Diagnosis not present

## 2020-08-15 HISTORY — DX: Atherosclerotic heart disease of native coronary artery without angina pectoris: I25.10

## 2020-08-15 MED ORDER — HYDROCHLOROTHIAZIDE 25 MG PO TABS: 25.0000 mg | ORAL_TABLET | Freq: Every day | ORAL | 3 refills | Status: DC

## 2020-08-15 MED ORDER — ROSUVASTATIN CALCIUM 20 MG PO TABS: 20.0000 mg | ORAL_TABLET | Freq: Every day | ORAL | 3 refills | Status: DC

## 2020-08-15 NOTE — Assessment & Plan Note (Signed)
Chest CT in 2021 showed 3 vessel CAD and aortic atherosclerosis.  LDL goal <70.  She exercises regularly and has no chest pain.  Switch simvastatin to rosuvastatin.

## 2020-08-15 NOTE — Progress Notes (Signed)
Advanced Hypertension Clinic Initial Assessment:    Date:  08/15/2020   ID:  Kristen Suarez, DOB [birth year], MRN [id number]  PCP:  Flossie Buffy, NP  Cardiologist:  None  Nephrologist:  Referring MD: Flossie Buffy, NP   CC: Hypertension  History of Present Illness:    Kristen Suarez is a 75 y.o. female with a hx of hypertension, hyperlipidemia, pulmonary sarcoidosis, and asthma here to establish care in the Advanced Hypertension Clinic.  She was first diagnosed with hypertension 20 years ago.  Recently she was in pulmonary rehab and the nurses were alarmed that her BP was running so high.  Her BP increased to 200 when walking.  It remains 140-160/70-80s.  Kristen Suarez has been working with Wilfred Lacy, NP on her blood pressure control.  Most recently hydralazine was added to her regimen of amlodipine and irbesartan.  Her home blood pressure readings remained elevated though her office readings seem to have improved.  She was referred to advanced hypertension clinic for further management.  Her BP hasn't changed since adding hydralazine.  She recently had two facial skin cancers removed in April and May 2022.  Prior to this she was exercising regularly.  She feels better when exercising.  She has no chest pain or pressure.  No has no lower extremity edema.  She does have orthopnea which chronic and she attributes to her underlying lung disease.  She did have COVID last year and notes that she has been more short of breath since that time.    Kristen Suarez follows a low sodium diet.  She rarely has beef and mostly eats poultry and fish.  She has several vegetarian meals each week.  She mostly cooks at home and limits sodium.  She does not have much caffeine in her diet and avoids NSAIDs.  Previous antihypertensives: Unsure   Past Medical History:  Diagnosis Date  . Arthritis   . Asthma   . CAD in native artery 08/15/2020  . Chickenpox   . Colon polyps 2010  . Heart murmur    . History of fainting spells of unknown cause   . Hyperlipidemia   . Hypertension   . Sarcoid 06/16/1976    Past Surgical History:  Procedure Laterality Date  . ABDOMINAL HYSTERECTOMY  1987  . BASAL CELL CARCINOMA EXCISION  06/30/2020   nose  . LUNG BIOPSY  06/16/1976    Current Medications: Current Meds  Medication Sig  . albuterol (PROVENTIL) (2.5 MG/3ML) 0.083% nebulizer solution USE 1 VIAL VIA NEBULIZER EVERY 6 HOURS  . albuterol (VENTOLIN HFA) 108 (90 Base) MCG/ACT inhaler Inhale 2 puffs into the lungs every 6 (six) hours as needed for wheezing or shortness of breath.  Marland Kitchen amLODipine (NORVASC) 10 MG tablet Take 1 tablet (10 mg total) by mouth daily.  . Budeson-Glycopyrrol-Formoterol (BREZTRI AEROSPHERE) 160-9-4.8 MCG/ACT AERO Inhale 2 puffs into the lungs in the morning and at bedtime.  Marland Kitchen CALCIUM PO Take 1 tablet by mouth daily.   Marland Kitchen docusate sodium (COLACE) 100 MG capsule Take 100 mg by mouth daily.   . famotidine (PEPCID) 10 MG tablet Take 10 mg by mouth daily. PRN  . fluticasone-salmeterol (ADVAIR HFA) 115-21 MCG/ACT inhaler   . hydrochlorothiazide (HYDRODIURIL) 25 MG tablet Take 1 tablet (25 mg total) by mouth daily.  . irbesartan (AVAPRO) 300 MG tablet Take 1 tablet (300 mg total) by mouth daily.  . mometasone (NASONEX) 50 MCG/ACT nasal spray Place 2 sprays into the nose daily.  . Multiple  Vitamins-Minerals (MULTIVITAL PO) Take 1 tablet by mouth daily.   . pantoprazole (PROTONIX) 40 MG tablet TAKE 1 TABLET(40 MG) BY MOUTH DAILY  . PREVIDENT 5000 BOOSTER PLUS 1.1 % PSTE SMARTSIG:To Teeth 1 to 2 Times Daily  . Probiotic Product (PROBIOTIC DAILY PO) Take 1 tablet by mouth daily.   . rosuvastatin (CRESTOR) 20 MG tablet Take 1 tablet (20 mg total) by mouth daily.  . [DISCONTINUED] hydrALAZINE (APRESOLINE) 10 MG tablet Take 1 tablet (10 mg total) by mouth in the morning and at bedtime. If BP >150/90  . [DISCONTINUED] simvastatin (ZOCOR) 20 MG tablet Take 1 tablet (20 mg total) by  mouth daily.     Allergies:   Penicillins, Sulfa antibiotics, and Keflex [cephalexin]   Social History   Socioeconomic History  . Marital status: Married    Spouse name: Not on file  . Number of children: Not on file  . Years of education: Not on file  . Highest education level: Not on file  Occupational History  . Occupation: retired  Tobacco Use  . Smoking status: Never Smoker  . Smokeless tobacco: Never Used  Vaping Use  . Vaping Use: Never used  Substance and Sexual Activity  . Alcohol use: Yes    Comment: once in a while  . Drug use: Never  . Sexual activity: Not Currently  Other Topics Concern  . Not on file  Social History Narrative  . Not on file   Social Determinants of Health   Financial Resource Strain: Low Risk   . Difficulty of Paying Living Expenses: Not hard at all  Food Insecurity: No Food Insecurity  . Worried About Charity fundraiser in the Last Year: Never true  . Ran Out of Food in the Last Year: Never true  Transportation Needs: No Transportation Needs  . Lack of Transportation (Medical): No  . Lack of Transportation (Non-Medical): No  Physical Activity: Sufficiently Active  . Days of Exercise per Week: 5 days  . Minutes of Exercise per Session: 30 min  Stress: No Stress Concern Present  . Feeling of Stress : Only a little  Social Connections: Moderately Isolated  . Frequency of Communication with Friends and Family: More than three times a week  . Frequency of Social Gatherings with Friends and Family: Once a week  . Attends Religious Services: Never  . Active Member of Clubs or Organizations: No  . Attends Archivist Meetings: Never  . Marital Status: Married     Family History: The patient's family history includes Alcohol abuse in her brother, brother, father, and sister; Arthritis in her mother; Cancer in her mother; Depression in her mother; Early death in her brother; Heart attack in her brother and mother; Heart disease in  her brother, brother, father, maternal grandfather, paternal grandfather, and sister; Hyperlipidemia in her brother, brother, father, mother, sister, sister, and son; Hypertension in her brother, father, mother, and sister; Multiple sclerosis in her daughter; Stroke in her sister.  ROS:   Please see the history of present illness.     All other systems reviewed and are negative.  EKGs/Labs/Other Studies Reviewed:    EKG:  EKG is ordered today.  The ekg ordered today demonstrates sinus rhythm rate 85 bpm  Recent Labs: 02/29/2020: Hemoglobin 13.9; Platelets 313.0; TSH 2.32 07/28/2020: ALT 24; BUN 21; Creatinine, Ser 1.06; Potassium 3.8; Sodium 140   Recent Lipid Panel    Component Value Date/Time   CHOL 199 07/28/2020 0840   TRIG 119.0  07/28/2020 0840   HDL 60.70 07/28/2020 0840   CHOLHDL 3 07/28/2020 0840   VLDL 23.8 07/28/2020 0840   LDLCALC 114 (H) 07/28/2020 0840    Physical Exam:   VS:  BP (!) 150/76   Pulse 85   Ht 5\' 3"  (1.6 m)   Wt 144 lb 6.4 oz (65.5 kg)   BMI 25.58 kg/m  , BMI Body mass index is 25.58 kg/m. GENERAL:  Well appearing HEENT: Pupils equal round and reactive, fundi not visualized, oral mucosa unremarkable NECK:  No jugular venous distention, waveform within normal limits, carotid upstroke brisk and symmetric, no bruits LUNGS:  Expiratory wheezes HEART:  RRR.  PMI not displaced or sustained,S1 and S2 within normal limits, no S3, no S4, no clicks, no rubs, no murmurs ABD:  Flat, positive bowel sounds normal in frequency in pitch, no bruits, no rebound, no guarding, no midline pulsatile mass, no hepatomegaly, no splenomegaly EXT:  2 plus pulses throughout, no edema, no cyanosis no clubbing SKIN:  No rashes no nodules NEURO:  Cranial nerves II through XII grossly intact, motor grossly intact throughout PSYCH:  Cognitively intact, oriented to person place and time   ASSESSMENT/PLAN:    HTN (hypertension), benign Blood pressure is poorly controlled.  She  has excellent diet and exercise habits.  We will start her on HCTZ 25mg  and check BMP in one week.  She will stop her hydralazine.  Continue irbesartan and amlodipine.  Continue with regular exercise and limiting sodium intake.  She was given and BP educational handbook and asked to track her BP twice daily.  She will bring this to follow-up with our pharmacist in 1 month.  Thyroid function has been normal.  We will check renal artery Dopplers.    Hyperlipidemia LDL goal is <70 given that she has 3 vessel CAD on CT.  Switch to rosuvastatin from simvastatin.  Check lipids/CMP in 3 months.   Sarcoidosis No cardiac symptoms.   CAD in native artery Chest CT in 2021 showed 3 vessel CAD and aortic atherosclerosis.  LDL goal <70.  She exercises regularly and has no chest pain.  Switch simvastatin to rosuvastatin.   Screening for Secondary Hypertension:  Causes 08/15/2020  Drugs/Herbals Screened     - Comments no issues  Renovascular HTN Screened     - Comments check renal Dopplers  Sleep Apnea Screened     - Comments no triggers    Relevant Labs/Studies: Basic Labs Latest Ref Rng & Units 07/28/2020 02/29/2020 05/05/2019  Sodium 135 - 145 mEq/L 140 137 137  Potassium 3.5 - 5.1 mEq/L 3.8 3.5 4.5  Creatinine 0.40 - 1.20 mg/dL 1.06 1.08 1.11(H)    Thyroid  Latest Ref Rng & Units 02/29/2020 04/21/2019  TSH 0.35 - 4.50 uIU/mL 2.32 1.87             Renovascular  08/15/2020  Renal Artery [place] Completed Yes      Disposition:    FU with MD/PharmD in 1 month    Medication Adjustments/Labs and Tests Ordered: Current medicines are reviewed at length with the patient today.  Concerns regarding medicines are outlined above.  Orders Placed This Encounter  Procedures  . Lipid panel  . Comprehensive metabolic panel  . Basic metabolic panel  . EKG 12-Lead  . VAS US RENAL ARTERY DUPLEX   Meds ordered this encounter  Medications  . rosuvastatin (CRESTOR) 20 MG tablet    Sig: Take 1 tablet (20 mg  total) by mouth daily.  Dispense:  90 tablet    Refill:  3    D/C SIMVASTATIN  . hydrochlorothiazide (HYDRODIURIL) 25 MG tablet    Sig: Take 1 tablet (25 mg total) by mouth daily.    Dispense:  90 tablet    Refill:  3     Signed, Skeet Latch, MD  08/15/2020 3:14 PM    [facility]

## 2020-08-15 NOTE — Patient Instructions (Signed)
Medication Instructions:  STOP SIMVASTATIN   START ROSUVASTATIN 20 MG DAILY   STOP HYDRALAZINE   START HYDROCHLOROTHIAZIDE 25 MG DAILY    Labwork: BMET IN 1 WEEK   FASTING LP/CMET IN 3 MONTHS    Testing/Procedures: Your physician has requested that you have a renal artery duplex. During this test, an ultrasound is used to evaluate blood flow to the kidneys. Allow one hour for this exam. Do not eat after midnight the day before and avoid carbonated beverages. Take your medications as you usually do.    Follow-Up: 09/21/2020 AT 9:00 AM WITH PHARM D    Special Instructions:   MONITOR YOUR BLOOD PRESSURE TWICE A DAY, LOG IN THE BOOK PROVIDED. BRING THE BOOK AND YOUR BLOOD PRESSURE MACHINE TO YOUR FOLLOW UP IN 1 MONTH   DASH Eating Plan DASH stands for "Dietary Approaches to Stop Hypertension." The DASH eating plan is a healthy eating plan that has been shown to reduce high blood pressure (hypertension). It may also reduce your risk for type 2 diabetes, heart disease, and stroke. The DASH eating plan may also help with weight loss. What are tips for following this plan?  General guidelines  Avoid eating more than 2,300 mg (milligrams) of salt (sodium) a day. If you have hypertension, you may need to reduce your sodium intake to 1,500 mg a day.  Limit alcohol intake to no more than 1 drink a day for nonpregnant women and 2 drinks a day for men. One drink equals 12 oz of beer, 5 oz of wine, or 1 oz of hard liquor.  Work with your health care provider to maintain a healthy body weight or to lose weight. Ask what an ideal weight is for you.  Get at least 30 minutes of exercise that causes your heart to beat faster (aerobic exercise) most days of the week. Activities may include walking, swimming, or biking.  Work with your health care provider or diet and nutrition specialist (dietitian) to adjust your eating plan to your individual calorie needs. Reading food labels   Check food  labels for the amount of sodium per serving. Choose foods with less than 5 percent of the Daily Value of sodium. Generally, foods with less than 300 mg of sodium per serving fit into this eating plan.  To find whole grains, look for the word "whole" as the first word in the ingredient list. Shopping  Buy products labeled as "low-sodium" or "no salt added."  Buy fresh foods. Avoid canned foods and premade or frozen meals. Cooking  Avoid adding salt when cooking. Use salt-free seasonings or herbs instead of table salt or sea salt. Check with your health care provider or pharmacist before using salt substitutes.  Do not fry foods. Cook foods using healthy methods such as baking, boiling, grilling, and broiling instead.  Cook with heart-healthy oils, such as olive, canola, soybean, or sunflower oil. Meal planning  Eat a balanced diet that includes: ? 5 or more servings of fruits and vegetables each day. At each meal, try to fill half of your plate with fruits and vegetables. ? Up to 6-8 servings of whole grains each day. ? Less than 6 oz of lean meat, poultry, or fish each day. A 3-oz serving of meat is about the same size as a deck of cards. One egg equals 1 oz. ? 2 servings of low-fat dairy each day. ? A serving of nuts, seeds, or beans 5 times each week. ? Heart-healthy fats. Healthy fats called  Omega-3 fatty acids are found in foods such as flaxseeds and coldwater fish, like sardines, salmon, and mackerel.  Limit how much you eat of the following: ? Canned or prepackaged foods. ? Food that is high in trans fat, such as fried foods. ? Food that is high in saturated fat, such as fatty meat. ? Sweets, desserts, sugary drinks, and other foods with added sugar. ? Full-fat dairy products.  Do not salt foods before eating.  Try to eat at least 2 vegetarian meals each week.  Eat more home-cooked food and less restaurant, buffet, and fast food.  When eating at a restaurant, ask that your  food be prepared with less salt or no salt, if possible. What foods are recommended? The items listed may not be a complete list. Talk with your dietitian about what dietary choices are best for you. Grains Whole-grain or whole-wheat bread. Whole-grain or whole-wheat pasta. Brown rice. [place]. Bulgur. Whole-grain and low-sodium cereals. Pita bread. Low-fat, low-sodium crackers. Whole-wheat flour tortillas. Vegetables Fresh or frozen vegetables (raw, steamed, roasted, or grilled). Low-sodium or reduced-sodium tomato and vegetable juice. Low-sodium or reduced-sodium tomato sauce and tomato paste. Low-sodium or reduced-sodium canned vegetables. Fruits All fresh, dried, or frozen fruit. Canned fruit in natural juice (without added sugar). Meat and other protein foods Skinless chicken or [place]. Ground chicken or [place]. Pork with fat trimmed off. Fish and seafood. Egg whites. Dried beans, peas, or lentils. Unsalted nuts, nut butters, and seeds. Unsalted canned beans. Lean cuts of beef with fat trimmed off. Low-sodium, lean deli meat. Dairy Low-fat (1%) or fat-free (skim) milk. Fat-free, low-fat, or reduced-fat cheeses. Nonfat, low-sodium ricotta or cottage cheese. Low-fat or nonfat yogurt. Low-fat, low-sodium cheese. Fats and oils Soft margarine without trans fats. Vegetable oil. Low-fat, reduced-fat, or light mayonnaise and salad dressings (reduced-sodium). Canola, safflower, olive, soybean, and sunflower oils. Avocado. Seasoning and other foods Herbs. Spices. Seasoning mixes without salt. Unsalted popcorn and pretzels. Fat-free sweets. What foods are not recommended? The items listed may not be a complete list. Talk with your dietitian about what dietary choices are best for you. Grains Baked goods made with fat, such as croissants, muffins, or some breads. Dry pasta or rice meal packs. Vegetables Creamed or fried vegetables. Vegetables in a cheese sauce. Regular canned vegetables (not  low-sodium or reduced-sodium). Regular canned tomato sauce and paste (not low-sodium or reduced-sodium). Regular tomato and vegetable juice (not low-sodium or reduced-sodium). Angie Fava. Olives. Fruits Canned fruit in a light or heavy syrup. Fried fruit. Fruit in cream or butter sauce. Meat and other protein foods Fatty cuts of meat. Ribs. Fried meat. Berniece Salines. Sausage. Bologna and other processed lunch meats. Salami. Fatback. Hotdogs. Bratwurst. Salted nuts and seeds. Canned beans with added salt. Canned or smoked fish. Whole eggs or egg yolks. Chicken or [place] with skin. Dairy Whole or 2% milk, cream, and half-and-half. Whole or full-fat cream cheese. Whole-fat or sweetened yogurt. Full-fat cheese. Nondairy creamers. Whipped toppings. Processed cheese and cheese spreads. Fats and oils Butter. Stick margarine. Lard. Shortening. Ghee. Bacon fat. Tropical oils, such as coconut, palm kernel, or palm oil. Seasoning and other foods Salted popcorn and pretzels. Onion salt, garlic salt, seasoned salt, table salt, and sea salt. Worcestershire sauce. Tartar sauce. Barbecue sauce. Teriyaki sauce. Soy sauce, including reduced-sodium. Steak sauce. Canned and packaged gravies. Fish sauce. Oyster sauce. Cocktail sauce. Horseradish that you find on the shelf. Ketchup. Mustard. Meat flavorings and tenderizers. Bouillon cubes. Hot sauce and Tabasco sauce. Premade or packaged marinades. Premade  or packaged taco seasonings. Relishes. Regular salad dressings. Where to find more information:  National Heart, Lung, and [place]: https://wilson-eaton.com/  American Heart Association: www.heart.org Summary  The DASH eating plan is a healthy eating plan that has been shown to reduce high blood pressure (hypertension). It may also reduce your risk for type 2 diabetes, heart disease, and stroke.  With the DASH eating plan, you should limit salt (sodium) intake to 2,300 mg a day. If you have hypertension, you may need to reduce  your sodium intake to 1,500 mg a day.  When on the DASH eating plan, aim to eat more fresh fruits and vegetables, whole grains, lean proteins, low-fat dairy, and heart-healthy fats.  Work with your health care provider or diet and nutrition specialist (dietitian) to adjust your eating plan to your individual calorie needs. This information is not intended to replace advice given to you by your health care provider. Make sure you discuss any questions you have with your health care provider. Document Released: 02/21/2011 Document Revised: 02/14/2017 Document Reviewed: 02/26/2016 Elsevier Patient Education  2020 [employer].

## 2020-08-15 NOTE — Assessment & Plan Note (Signed)
No cardiac symptoms.

## 2020-08-15 NOTE — Assessment & Plan Note (Addendum)
Blood pressure is poorly controlled.  She has excellent diet and exercise habits.  We will start her on HCTZ 25mg  and check BMP in one week.  She will stop her hydralazine.  Continue irbesartan and amlodipine.  Continue with regular exercise and limiting sodium intake.  She was given and BP educational handbook and asked to track her BP twice daily.  She will bring this to follow-up with our pharmacist in 1 month.  Thyroid function has been normal.  We will check renal artery Dopplers.

## 2020-08-15 NOTE — Assessment & Plan Note (Signed)
LDL goal is <70 given that she has 3 vessel CAD on CT.  Switch to rosuvastatin from simvastatin.  Check lipids/CMP in 3 months.

## 2020-08-17 ENCOUNTER — Ambulatory Visit (INDEPENDENT_AMBULATORY_CARE_PROVIDER_SITE_OTHER): Payer: Medicare Other

## 2020-08-17 DIAGNOSIS — R55 Syncope and collapse: Secondary | ICD-10-CM | POA: Diagnosis not present

## 2020-08-19 LAB — CUP PACEART REMOTE DEVICE CHECK
Date Time Interrogation Session: 20220603002402
Implantable Pulse Generator Implant Date: 20190109

## 2020-08-22 LAB — COMPREHENSIVE METABOLIC PANEL
ALT: 25 IU/L (ref 0–32)
AST: 26 IU/L (ref 0–40)
Albumin/Globulin Ratio: 2.3 — ABNORMAL HIGH (ref 1.2–2.2)
Albumin: 4.5 g/dL (ref 3.7–4.7)
Alkaline Phosphatase: 116 IU/L (ref 44–121)
BUN/Creatinine Ratio: 23 (ref 12–28)
BUN: 27 mg/dL (ref 8–27)
Bilirubin Total: 0.4 mg/dL (ref 0.0–1.2)
CO2: 28 mmol/L (ref 20–29)
Calcium: 9.6 mg/dL (ref 8.7–10.3)
Chloride: 97 mmol/L (ref 96–106)
Creatinine, Ser: 1.15 mg/dL — ABNORMAL HIGH (ref 0.57–1.00)
Globulin, Total: 2 g/dL (ref 1.5–4.5)
Glucose: 112 mg/dL — ABNORMAL HIGH (ref 65–99)
Potassium: 3.3 mmol/L — ABNORMAL LOW (ref 3.5–5.2)
Sodium: 140 mmol/L (ref 134–144)
Total Protein: 6.5 g/dL (ref 6.0–8.5)
eGFR: 50 mL/min/{1.73_m2} — ABNORMAL LOW (ref >59)

## 2020-08-22 LAB — LIPID PANEL
Chol/HDL Ratio: 3.4 ratio (ref 0.0–4.4)
Cholesterol, Total: 168 mg/dL (ref 100–199)
HDL: 50 mg/dL (ref >39)
LDL Chol Calc (NIH): 86 mg/dL (ref 0–99)
Triglycerides: 189 mg/dL — ABNORMAL HIGH (ref 0–149)
VLDL Cholesterol Cal: 32 mg/dL (ref 5–40)

## 2020-08-23 ENCOUNTER — Encounter: Payer: Medicare Other | Admitting: Physical Therapy

## 2020-09-06 ENCOUNTER — Telehealth: Payer: Self-pay | Admitting: *Deleted

## 2020-09-06 DIAGNOSIS — E7800 Pure hypercholesterolemia, unspecified: Secondary | ICD-10-CM

## 2020-09-06 DIAGNOSIS — Z5181 Encounter for therapeutic drug level monitoring: Secondary | ICD-10-CM

## 2020-09-06 DIAGNOSIS — I10 Essential (primary) hypertension: Secondary | ICD-10-CM

## 2020-09-06 MED ORDER — POTASSIUM CHLORIDE CRYS ER 20 MEQ PO TBCR: 20.0000 meq | EXTENDED_RELEASE_TABLET | Freq: Every day | ORAL | 3 refills | Status: DC

## 2020-09-06 NOTE — Telephone Encounter (Signed)
-----   Message from Skeet Latch, MD sent at 09/05/2020 11:24 AM EDT ----- Kidney function is stable.  Potassium is little low.  Start potassium chloride 20 mEq daily.  Cholesterol levels are slightly above goal.  She already switched to rosuvastatin.  Repeat lipids in 3 months as planned.

## 2020-09-06 NOTE — Telephone Encounter (Signed)
Advised patient of lab results and new medication sent to [facility]

## 2020-09-07 ENCOUNTER — Ambulatory Visit (HOSPITAL_COMMUNITY)
Admission: RE | Admit: 2020-09-07 | Discharge: 2020-09-07 | Disposition: A | Discharge status: Home / Self Care | Payer: Medicare Other | Source: Ambulatory Visit | Attending: Cardiovascular Disease | Admitting: Cardiovascular Disease

## 2020-09-07 ENCOUNTER — Other Ambulatory Visit: Payer: Self-pay

## 2020-09-07 DIAGNOSIS — I10 Essential (primary) hypertension: Secondary | ICD-10-CM | POA: Diagnosis not present

## 2020-09-08 NOTE — Progress Notes (Signed)
Carelink Summary Report / Loop Recorder 

## 2020-09-09 ENCOUNTER — Encounter: Payer: Self-pay | Admitting: Nurse Practitioner

## 2020-09-19 ENCOUNTER — Ambulatory Visit (INDEPENDENT_AMBULATORY_CARE_PROVIDER_SITE_OTHER): Payer: Medicare Other

## 2020-09-19 DIAGNOSIS — R55 Syncope and collapse: Secondary | ICD-10-CM | POA: Diagnosis not present

## 2020-09-20 LAB — CUP PACEART REMOTE DEVICE CHECK
Date Time Interrogation Session: 20220706004308
Implantable Pulse Generator Implant Date: 20190109

## 2020-09-21 ENCOUNTER — Ambulatory Visit (INDEPENDENT_AMBULATORY_CARE_PROVIDER_SITE_OTHER): Payer: Medicare Other | Admitting: Pharmacist Clinician (PhC)/ Clinical Pharmacy Specialist

## 2020-09-21 ENCOUNTER — Other Ambulatory Visit: Payer: Self-pay

## 2020-09-21 ENCOUNTER — Telehealth: Payer: Self-pay | Admitting: Cardiovascular Disease

## 2020-09-21 VITALS — BP 144/78 | HR 85 | Resp 14 | Ht 63.0 in | Wt 148.0 lb

## 2020-09-21 DIAGNOSIS — I10 Essential (primary) hypertension: Secondary | ICD-10-CM | POA: Diagnosis not present

## 2020-09-21 DIAGNOSIS — I701 Atherosclerosis of renal artery: Secondary | ICD-10-CM

## 2020-09-21 MED ORDER — SPIRONOLACTONE 25 MG PO TABS: 12.5000 mg | ORAL_TABLET | Freq: Every day | ORAL | 3 refills | Status: DC

## 2020-09-21 NOTE — Telephone Encounter (Signed)
Spoke with pt pharmacy, aware of the interaction.

## 2020-09-21 NOTE — Progress Notes (Signed)
09/22/2020 Kristen Suarez [birth year] [id number]   HPI:  Kristen Suarez is a 75 y.o. female patient of Dr Oval Linsey, with a PMH below who presents today for advanced hypertension clinic monitoring, referred by Dr. [name] Nche  She was seen by Dr. Oval Linsey about 6 weeks ago, at which time her pressure was noted to be 150/76.  Patient reports first being diagnosed about 20 years ago, and more recently had an episode during pulmonary rehab when her pressure increased to 024 systolic.  Her PCP added hydralazine, however this did not have any noticeable impact and she was referred to the [facility].  When she saw Dr. Oval Linsey the hydralazine was stopped and she was started on hydrochlorothiazide 25 mg daily.  No significant change to serum creatinine two weeks later.  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 She has an appointment with Dr. Fletcher Anon next week to review this finding.    Today patient is in the office for follow up.  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, goes three times per week  Home BP readings: 36 AM  readings average 154/81 (HR 80) range 137-168/70-89    30 PM readings average 160/81 (HR 83) range 148-172/72-92  Intolerances: no cardiac medication intolerances  Labs: 08/22/20: Na 140, K 3.3, Glu 112, BUN 27, SCr 1.15, GFR 50   Wt Readings from Last 3 Encounters:  09/21/20 148 lb (67.1 kg)  08/15/20 144 lb 6.4 oz (65.5 kg)  08/02/20 147 lb 12.8 oz (67 kg)   BP Readings from Last 3 Encounters:  09/21/20 (!) 144/78  08/15/20 (!) 150/76  08/02/20 (!) 162/91   Pulse Readings from Last 3 Encounters:  09/21/20 85  08/15/20 85  08/02/20 94    Current Outpatient Medications  Medication Sig Dispense Refill   albuterol (PROVENTIL) (2.5 MG/3ML) 0.083% nebulizer solution USE 1 VIAL VIA NEBULIZER EVERY 6 HOURS 150 mL 5   albuterol (VENTOLIN HFA) 108 (90 Base) MCG/ACT inhaler Inhale 2 puffs into the lungs every 6 (six) hours as needed for wheezing or shortness of breath. 8 g 6   amLODipine (NORVASC) 10 MG tablet Take 1 tablet (10 mg total) by mouth daily. 90 tablet 3   Budeson-Glycopyrrol-Formoterol (BREZTRI AEROSPHERE) 160-9-4.8 MCG/ACT AERO Inhale 2 puffs into the lungs in the morning and at bedtime. 32.1 g 2   CALCIUM PO Take 1 tablet by mouth daily.      docusate sodium (COLACE) 100 MG capsule Take 100 mg by mouth daily.      famotidine (PEPCID) 10 MG tablet Take 10 mg by mouth daily. PRN  hydrochlorothiazide (HYDRODIURIL) 25 MG tablet Take 1 tablet (25 mg total) by mouth daily. 90 tablet 3   irbesartan (AVAPRO) 300 MG tablet Take 1 tablet (300 mg total) by mouth daily. 90 tablet 3   mometasone (NASONEX) 50 MCG/ACT nasal spray Place 2 sprays into the nose daily. 1 each 3   Multiple Vitamins-Minerals (MULTIVITAL PO) Take 1 tablet by mouth daily.      pantoprazole (PROTONIX) 40 MG tablet TAKE 1 TABLET(40 MG) BY MOUTH DAILY 30 tablet 5   potassium chloride SA (KLOR-CON) 20 MEQ tablet Take 1 tablet (20 mEq total) by mouth daily. 90 tablet 3   PREVIDENT 5000 BOOSTER PLUS 1.1 % PSTE  SMARTSIG:To Teeth 1 to 2 Times Daily     Probiotic Product (PROBIOTIC DAILY PO) Take 1 tablet by mouth daily.      rosuvastatin (CRESTOR) 20 MG tablet Take 1 tablet (20 mg total) by mouth daily. 90 tablet 3   spironolactone (ALDACTONE) 25 MG tablet Take 0.5 tablets (12.5 mg total) by mouth daily. 30 tablet 3   No current facility-administered medications for this visit.    Allergies  Allergen Reactions   Penicillins Itching   Sulfa Antibiotics Itching   Keflex [Cephalexin] Rash    Past Medical History:  Diagnosis Date   Arthritis    Asthma    CAD in native artery 08/15/2020   Chickenpox    Colon polyps 2010   Heart murmur    History of fainting spells of unknown cause    Hyperlipidemia    Hypertension    Sarcoid 06/16/1976    Blood pressure (!) 144/78, pulse 85, resp. rate 14, height 5\' 3"  (1.6 m), weight 148 lb (67.1 kg), SpO2 98 %.  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  Chart notes she did not see therapeutic effect with hydralazine, however it was dosed only at 10 mg bid prn.  Patient currently on guideline directed therapy of ARB, thiazide and CCB, not at BP goal.  Most recent labs show low potassium level, so will add in sprionolactone 12.5 mg to start.  She will have a repeat metabolic panel in 2 weeks and we will see her back in the office in one month.  She should continue with home BP recording.     [name] PharmD CPP [facility] [address] [place], [place] [address] [phone]

## 2020-09-21 NOTE — Telephone Encounter (Signed)
     Pt c/o medication issue:  1. Name of Medication:   potassium chloride SA (KLOR-CON) 20 MEQ tablet    spironolactone (ALDACTONE) 25 MG tablet    2. How are you currently taking this medication (dosage and times per day)?   3. Are you having a reaction (difficulty breathing--STAT)?   4. What is your medication issue? [facility] with [facility] would like to verify of pt will be on these medications. She said there's drug interaction between this drug

## 2020-09-21 NOTE — Patient Instructions (Signed)
Return for a a follow up appointment August 9 at 8:30 am  Go to the lab in 10-14 days to check kidney function  Check your blood pressure at home daily and keep record of the readings.  Take your BP meds as follows:  Start spironolactone 12.5 mg (1/2 tablet) once daily in the evenings.   Bring all of your meds, your BP cuff and your record of home blood pressures to your next appointment.  Exercise as you're able, try to walk approximately 30 minutes per day.  Keep salt intake to a minimum, especially watch canned and prepared boxed foods.  Eat more fresh fruits and vegetables and fewer canned items.  Avoid eating in fast food restaurants.    HOW TO TAKE YOUR BLOOD PRESSURE: Rest 5 minutes before taking your blood pressure.  Don't smoke or drink caffeinated beverages for at least 30 minutes before. Take your blood pressure before (not after) you eat. Sit comfortably with your back supported and both feet on the floor (don't cross your legs). Elevate your arm to heart level on a table or a desk. Use the proper sized cuff. It should fit smoothly and snugly around your bare upper arm. There should be enough room to slip a fingertip under the cuff. The bottom edge of the cuff should be 1 inch above the crease of the elbow. Ideally, take 3 measurements at one sitting and record the average.

## 2020-09-22 ENCOUNTER — Encounter: Payer: Self-pay | Admitting: Pharmacist Clinician (PhC)/ Clinical Pharmacy Specialist

## 2020-09-22 NOTE — Assessment & Plan Note (Signed)
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  Chart notes she did not see therapeutic effect with hydralazine, however it was dosed only at 10 mg bid prn.  Patient currently on guideline directed therapy of ARB, thiazide and CCB, not at BP goal.  Most recent labs show low potassium level, so will add in sprionolactone 12.5 mg to start.  She will have a repeat metabolic panel in 2 weeks and we will see her back in the office in one month.  She should continue with home BP recording.

## 2020-09-26 ENCOUNTER — Encounter: Payer: Self-pay | Admitting: Cardiovascular Disease

## 2020-09-26 ENCOUNTER — Ambulatory Visit (INDEPENDENT_AMBULATORY_CARE_PROVIDER_SITE_OTHER): Payer: Medicare Other | Admitting: Cardiovascular Disease

## 2020-09-26 ENCOUNTER — Other Ambulatory Visit: Payer: Self-pay

## 2020-09-26 VITALS — BP 132/78 | HR 82 | Resp 18 | Ht 63.0 in | Wt 147.0 lb

## 2020-09-26 DIAGNOSIS — I701 Atherosclerosis of renal artery: Secondary | ICD-10-CM

## 2020-09-26 DIAGNOSIS — I150 Renovascular hypertension: Secondary | ICD-10-CM | POA: Diagnosis not present

## 2020-09-26 DIAGNOSIS — E7800 Pure hypercholesterolemia, unspecified: Secondary | ICD-10-CM | POA: Diagnosis not present

## 2020-09-26 NOTE — Progress Notes (Signed)
Cardiology Office Note   Date:  09/26/2020   ID:  [name], DOB 12-04-1945, MRN [id number]  PCP:  Flossie Buffy, NP  Cardiologist: Dr. Oval Linsey  No chief complaint on file.     History of Present Illness: Kristen Suarez is a 75 y.o. female who was referred by Dr. Oval Linsey for evaluation management of refractory hypertension and renal artery stenosis. She has known history of hypertension, coronary calcifications noted on previous CT scan, pulmonary sarcoidosis and asthma.  She was diagnosed with hypertension 20 years ago but had recent significant elevation in blood pressure to 200 mmHg.  Her blood pressure improved after adjusting her antihypertensive medications.  She has mild chronic kidney disease with most recent GFR 50. Most recently, spironolactone was added and since then her blood pressure has been controlled.  She feels well with no chest pain or shortness of breath.  She underwent renal artery duplex in June which showed significant right renal artery stenosis with peak velocity of 291 with a bruit.  No flow was detected into the left renal artery.  Right kidney size was 10.6 cm and left kidney was 8 cm.   Past Medical History:  Diagnosis Date   Arthritis    Asthma    CAD in native artery 08/15/2020   Chickenpox    Colon polyps 2010   Heart murmur    History of fainting spells of unknown cause    Hyperlipidemia    Hypertension    Sarcoid 06/16/1976    Past Surgical History:  Procedure Laterality Date   ABDOMINAL HYSTERECTOMY  1987   BASAL CELL CARCINOMA EXCISION  06/30/2020   nose   LUNG BIOPSY  06/16/1976     Current Outpatient Medications  Medication Sig Dispense Refill   albuterol (PROVENTIL) (2.5 MG/3ML) 0.083% nebulizer solution USE 1 VIAL VIA NEBULIZER EVERY 6 HOURS 150 mL 5   albuterol (VENTOLIN HFA) 108 (90 Base) MCG/ACT inhaler Inhale 2 puffs into the lungs every 6 (six) hours as needed for wheezing or shortness of breath. 8 g 6    amLODipine (NORVASC) 10 MG tablet Take 1 tablet (10 mg total) by mouth daily. 90 tablet 3   Budeson-Glycopyrrol-Formoterol (BREZTRI AEROSPHERE) 160-9-4.8 MCG/ACT AERO Inhale 2 puffs into the lungs in the morning and at bedtime. 32.1 g 2   CALCIUM PO Take 1 tablet by mouth daily.      docusate sodium (COLACE) 100 MG capsule Take 100 mg by mouth daily.      famotidine (PEPCID) 10 MG tablet Take 10 mg by mouth daily. PRN     hydrochlorothiazide (HYDRODIURIL) 25 MG tablet Take 1 tablet (25 mg total) by mouth daily. 90 tablet 3   irbesartan (AVAPRO) 300 MG tablet Take 1 tablet (300 mg total) by mouth daily. 90 tablet 3   mometasone (NASONEX) 50 MCG/ACT nasal spray Place 2 sprays into the nose daily. 1 each 3   Multiple Vitamins-Minerals (MULTIVITAL PO) Take 1 tablet by mouth daily.      pantoprazole (PROTONIX) 40 MG tablet TAKE 1 TABLET(40 MG) BY MOUTH DAILY 30 tablet 5   potassium chloride SA (KLOR-CON) 20 MEQ tablet Take 1 tablet (20 mEq total) by mouth daily. 90 tablet 3   PREVIDENT 5000 BOOSTER PLUS 1.1 % PSTE SMARTSIG:To Teeth 1 to 2 Times Daily     Probiotic Product (PROBIOTIC DAILY PO) Take 1 tablet by mouth daily.      rosuvastatin (CRESTOR) 20 MG tablet Take 1 tablet (20 mg total) by  mouth daily. 90 tablet 3   spironolactone (ALDACTONE) 25 MG tablet Take 0.5 tablets (12.5 mg total) by mouth daily. 30 tablet 3   No current facility-administered medications for this visit.    Allergies:   Penicillins, Sulfa antibiotics, and Keflex [cephalexin]    Social History:  The patient  reports that she has never smoked. She has never used smokeless tobacco. She reports current alcohol use. She reports that she does not use drugs.   Family History:  The patient's family history includes Alcohol abuse in her brother, brother, father, and sister; Arthritis in her mother; Cancer in her mother; Depression in her mother; Early death in her brother; Heart attack in her brother and mother; Heart disease in  her brother, brother, father, maternal grandfather, paternal grandfather, and sister; Hyperlipidemia in her brother, brother, father, mother, sister, sister, and son; Hypertension in her brother, father, mother, and sister; Multiple sclerosis in her daughter; Stroke in her sister.    ROS:  Please see the history of present illness.   Otherwise, review of systems are positive for none.   All other systems are reviewed and negative.    PHYSICAL EXAM: VS:  BP 132/78   Pulse 82   Resp 18   Ht 5\' 3"  (1.6 m)   Wt 147 lb (66.7 kg)   SpO2 98%   BMI 26.04 kg/m  , BMI Body mass index is 26.04 kg/m. GEN: Well nourished, well developed, in no acute distress  HEENT: normal  Neck: no JVD, carotid bruits, or masses Cardiac: RRR; no murmurs, rubs, or gallops,no edema  Respiratory:  clear to auscultation bilaterally, normal work of breathing GI: soft, nontender, nondistended, + BS MS: no deformity or atrophy  Skin: warm and dry, no rash Neuro:  Strength and sensation are intact Psych: euthymic mood, full affect   EKG:  EKG is not ordered today.    Recent Labs: 02/29/2020: Hemoglobin 13.9; Platelets 313.0; TSH 2.32 08/22/2020: ALT 25; BUN 27; Creatinine, Ser 1.15; Potassium 3.3; Sodium 140    Lipid Panel    Component Value Date/Time   CHOL 168 08/22/2020 1104   TRIG 189 (H) 08/22/2020 1104   HDL 50 08/22/2020 1104   CHOLHDL 3.4 08/22/2020 1104   CHOLHDL 3 07/28/2020 0840   VLDL 23.8 07/28/2020 0840   LDLCALC 86 08/22/2020 1104      Wt Readings from Last 3 Encounters:  09/26/20 147 lb (66.7 kg)  09/21/20 148 lb (67.1 kg)  08/15/20 144 lb 6.4 oz (65.5 kg)      No flowsheet data found.    ASSESSMENT AND PLAN:  1.  Renal artery stenosis: I reviewed the results of recent renal artery duplex in details.  It appears that there was no flow detected in the left renal artery and the left kidney was much smaller than the right.  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  The right renal artery appears to have borderline stenosis but the velocity was still below 300.  Suspect hyperdynamic flow in the right renal artery due to overcompensation. I discussed with her the current strict indications for renal artery revascularization including refractory hypertension, progressive chronic kidney disease or recurrent heart failure if no other reasons. Her blood pressure seems to be well controlled since addition of spironolactone.  Her renal function has been relatively stable over the last year with GFR around 50.  No history of heart failure.  Based on this, I think we can monitor her closely  before considering angiography and revascularization. I am going to check basic metabolic profile today given the recent addition of spironolactone.  Otherwise, I will plan on seeing her again in 3 months to ensure stability.  2.  Hyperlipidemia: The patient has evidence of atherosclerosis and thus we should target an LDL of less than 70.  She was recently switched from simvastatin to rosuvastatin.  Most recent LDL was 86.    Disposition:   FU with me in 3 months  Signed,  Kathlyn Sacramento, MD  09/26/2020 9:22 AM    [place]

## 2020-09-26 NOTE — Patient Instructions (Signed)
Medication Instructions:  No Changes In Medications at this time.  *If you need a refill on your cardiac medications before your next appointment, please call your pharmacy*  Lab Work: BMET-TODAY If you have labs (blood work) drawn today and your tests are completely normal, you will receive your results only by: [place] (if you have MyChart) OR A paper copy in the mail If you have any lab test that is abnormal or we need to change your treatment, we will call you to review the results.  Follow-Up: At [facility], you and your health needs are our priority.  As part of our continuing mission to provide you with exceptional heart care, we have created designated Provider Care Teams.  These Care Teams include your primary Cardiologist (physician) and Advanced Practice Providers (APPs -  Physician Assistants and Nurse Practitioners) who all work together to provide you with the care you need, when you need it.  Your next appointment:   3 month(s)  The format for your next appointment:   In Person  Provider:   Kathlyn Sacramento, MD

## 2020-09-27 ENCOUNTER — Encounter: Payer: Self-pay | Admitting: Rheumatology

## 2020-09-27 NOTE — Telephone Encounter (Signed)
I do not have any previous knee exercises to review. She can try icing, elevating, and using a compression sleeve/ace wrap for support. If her symptoms persist or worsen please advise the patient to schedule an appointment for further evaluation and management.

## 2020-09-28 LAB — BASIC METABOLIC PANEL
BUN/Creatinine Ratio: 23 (ref 12–28)
BUN: 28 mg/dL — ABNORMAL HIGH (ref 8–27)
CO2: 20 mmol/L (ref 20–29)
Calcium: 10.3 mg/dL (ref 8.7–10.3)
Chloride: 95 mmol/L — ABNORMAL LOW (ref 96–106)
Creatinine, Ser: 1.21 mg/dL — ABNORMAL HIGH (ref 0.57–1.00)
Glucose: 91 mg/dL (ref 65–99)
Potassium: 4.6 mmol/L (ref 3.5–5.2)
Sodium: 137 mmol/L (ref 134–144)
eGFR: 47 mL/min/{1.73_m2} — ABNORMAL LOW (ref >59)

## 2020-10-01 ENCOUNTER — Encounter (HOSPITAL_BASED_OUTPATIENT_CLINIC_OR_DEPARTMENT_OTHER): Payer: Self-pay

## 2020-10-02 ENCOUNTER — Other Ambulatory Visit: Payer: Self-pay | Admitting: *Deleted

## 2020-10-02 ENCOUNTER — Encounter: Payer: Self-pay | Admitting: *Deleted

## 2020-10-02 DIAGNOSIS — I150 Renovascular hypertension: Secondary | ICD-10-CM

## 2020-10-05 MED ORDER — PANTOPRAZOLE SODIUM 40 MG PO TBEC: DELAYED_RELEASE_TABLET | ORAL | 5 refills | Status: DC

## 2020-10-06 NOTE — Progress Notes (Signed)
Carelink Summary Report / Loop Recorder 

## 2020-10-19 ENCOUNTER — Ambulatory Visit (INDEPENDENT_AMBULATORY_CARE_PROVIDER_SITE_OTHER): Payer: Medicare Other

## 2020-10-19 DIAGNOSIS — R55 Syncope and collapse: Secondary | ICD-10-CM

## 2020-10-23 ENCOUNTER — Encounter: Payer: Medicare Other | Admitting: Internal Medicine

## 2020-10-23 LAB — CUP PACEART REMOTE DEVICE CHECK
Date Time Interrogation Session: 20220808014134
Implantable Pulse Generator Implant Date: 20190109

## 2020-10-24 ENCOUNTER — Other Ambulatory Visit: Payer: Self-pay

## 2020-10-24 ENCOUNTER — Ambulatory Visit (INDEPENDENT_AMBULATORY_CARE_PROVIDER_SITE_OTHER): Payer: Medicare Other | Admitting: Pharmacist

## 2020-10-24 VITALS — BP 120/62 | HR 87 | Resp 16 | Ht 63.0 in | Wt 145.4 lb

## 2020-10-24 DIAGNOSIS — I150 Renovascular hypertension: Secondary | ICD-10-CM

## 2020-10-24 DIAGNOSIS — I701 Atherosclerosis of renal artery: Secondary | ICD-10-CM

## 2020-10-24 LAB — BASIC METABOLIC PANEL
BUN/Creatinine Ratio: 28 (ref 12–28)
BUN: 38 mg/dL — ABNORMAL HIGH (ref 8–27)
CO2: 22 mmol/L (ref 20–29)
Calcium: 9.8 mg/dL (ref 8.7–10.3)
Chloride: 98 mmol/L (ref 96–106)
Creatinine, Ser: 1.35 mg/dL — ABNORMAL HIGH (ref 0.57–1.00)
Glucose: 104 mg/dL — ABNORMAL HIGH (ref 65–99)
Potassium: 4.2 mmol/L (ref 3.5–5.2)
Sodium: 139 mmol/L (ref 134–144)
eGFR: 41 mL/min/{1.73_m2} — ABNORMAL LOW (ref >59)

## 2020-10-24 NOTE — Progress Notes (Signed)
Patient ID: Kristen Suarez                 DOB: 1945-07-23                      MRN: [id number]     HPI: Kristen Suarez is a 75 y.o. female referred by Dr. Oval Linsey to HTN clinic. PMH is significant for HTN, CAD, and COPD.  She was seen by Dr. Oval Linsey about 2 months ago at which time her pressure was noted to be 150/76.  Patient reports first being diagnosed about 20 years ago, and more recently had an episode during pulmonary rehab when her pressure increased to [id number] systolic.  Her PCP added hydralazine, however this did not have any noticeable impact and she was referred to the [facility].  Of note, hydralazine dose was '10mg'$  TID.   When she saw Dr. Oval Linsey the hydralazine was stopped and she was started on hydrochlorothiazide 25 mg daily.   At last visit with PharmD, spironolactone 12.'5mg'$  once daily was added.  Patient presents today with husband in good spirits.  Complains of occasional headaches, and leg cramps.  Brought BP log  130/73 128/67 138/71 138/69 124/81 126/71 135/76 127/72 138/75 127/72 141/79  Of note, patient takes BP when she first wakes up before she takes her medication or eats breakfast.  Patient has eliminated salt from diet and she and her husband are very active at [employer] and fitness at [employer]. Goes 3 times a week for an hour at a time.  Does not drink caffeine.  Husband is a former Marine scientist.  Current HTN meds: spironolactone 12.'5mg'$  daily, HCTZ '25mg'$  daily, amlodipine '10mg'$  daily, irbesartan '300mg'$  daily Previously tried: hydralazine '10mg'$  BP goal: <130/80   Wt Readings from Last 3 Encounters:  09/26/20 147 lb (66.7 kg)  09/21/20 148 lb (67.1 kg)  08/15/20 144 lb 6.4 oz (65.5 kg)   BP Readings from Last 3 Encounters:  09/26/20 132/78  09/21/20 (!) 144/78  08/15/20 (!) 150/76   Pulse Readings from Last 3 Encounters:  09/26/20 82  09/21/20 85  08/15/20 85    Renal function: CrCl cannot be calculated (Patient's most recent lab  result is older than the maximum 21 days allowed.).  Past Medical History:  Diagnosis Date   Arthritis    Asthma    CAD in native artery 08/15/2020   Chickenpox    Colon polyps 2010   Heart murmur    History of fainting spells of unknown cause    Hyperlipidemia    Hypertension    Sarcoid 06/16/1976    Current Outpatient Medications on File Prior to Visit  Medication Sig Dispense Refill   albuterol (PROVENTIL) (2.5 MG/3ML) 0.083% nebulizer solution USE 1 VIAL VIA NEBULIZER EVERY 6 HOURS 150 mL 5   albuterol (VENTOLIN HFA) 108 (90 Base) MCG/ACT inhaler Inhale 2 puffs into the lungs every 6 (six) hours as needed for wheezing or shortness of breath. 8 g 6   amLODipine (NORVASC) 10 MG tablet Take 1 tablet (10 mg total) by mouth daily. 90 tablet 3   Budeson-Glycopyrrol-Formoterol (BREZTRI AEROSPHERE) 160-9-4.8 MCG/ACT AERO Inhale 2 puffs into the lungs in the morning and at bedtime. 32.1 g 2   CALCIUM PO Take 1 tablet by mouth daily.      docusate sodium (COLACE) 100 MG capsule Take 100 mg by mouth daily.      famotidine (PEPCID) 10 MG tablet Take 10 mg by mouth daily. PRN  hydrochlorothiazide (HYDRODIURIL) 25 MG tablet Take 1 tablet (25 mg total) by mouth daily. 90 tablet 3   irbesartan (AVAPRO) 300 MG tablet Take 1 tablet (300 mg total) by mouth daily. 90 tablet 3   mometasone (NASONEX) 50 MCG/ACT nasal spray Place 2 sprays into the nose daily. 1 each 3   Multiple Vitamins-Minerals (MULTIVITAL PO) Take 1 tablet by mouth daily.      pantoprazole (PROTONIX) 40 MG tablet TAKE 1 TABLET(40 MG) BY MOUTH DAILY 30 tablet 5   potassium chloride SA (KLOR-CON) 20 MEQ tablet Take 1 tablet (20 mEq total) by mouth daily. 90 tablet 3   PREVIDENT 5000 BOOSTER PLUS 1.1 % PSTE SMARTSIG:To Teeth 1 to 2 Times Daily     Probiotic Product (PROBIOTIC DAILY PO) Take 1 tablet by mouth daily.      rosuvastatin (CRESTOR) 20 MG tablet Take 1 tablet (20 mg total) by mouth daily. 90 tablet 3   spironolactone  (ALDACTONE) 25 MG tablet Take 0.5 tablets (12.5 mg total) by mouth daily. 30 tablet 3   No current facility-administered medications on file prior to visit.    Allergies  Allergen Reactions   Penicillins Itching   Sulfa Antibiotics Itching   Keflex [Cephalexin] Rash     Assessment/Plan:  1. Hypertension -  Patient's blood pressure in room 128/64, rechecked 30 minutes later and was 120/62. Both are at goal of <130/80.  No medication changes needed at this time.  Recommended patient begin checking BP 1-2 hours after her morning medications.  Continue spironolactone 12.'5mg'$  daily Continue amlodipine '10mg'$  daily Continue irbesartan '300mg'$  daily Continue HCTZ '25mg'$  daily Has appt with Dr Fletcher Anon in 2 months  Karren Cobble, PharmD, BCACP, [place], [place] [address] [place], [place] [address] Phone: [phone]; Fax: [phone] 10/24/2020 5:27 PM

## 2020-10-24 NOTE — Patient Instructions (Signed)
It was nice meeting you today!  We would like your blood pressure to be less than 130/80  Continue your: Spironolactone 12.'5mg'$  daily Amlodipine '10mg'$  daily Irbesartan '300mg'$  daily Hydrochlorothiazide '25mg'$  daily  Continue to check your blood pressure regularly, about 1-2 hours after you take your medications  Continue your exercise plan at [facility]  If you notice your blood pressure trending up please give [place] a call  Karren Cobble, PharmD, BCACP, [place], [place]. [address] [place], [place] [address] Phone: [phone]; Fax: [phone] 10/24/2020 8:54 AM

## 2020-10-27 ENCOUNTER — Encounter: Payer: Self-pay | Admitting: Internal Medicine

## 2020-10-27 ENCOUNTER — Ambulatory Visit (INDEPENDENT_AMBULATORY_CARE_PROVIDER_SITE_OTHER): Payer: Medicare Other | Admitting: Internal Medicine

## 2020-10-27 ENCOUNTER — Other Ambulatory Visit: Payer: Self-pay

## 2020-10-27 VITALS — BP 142/80 | HR 71 | Ht 63.0 in | Wt 146.0 lb

## 2020-10-27 DIAGNOSIS — I701 Atherosclerosis of renal artery: Secondary | ICD-10-CM | POA: Diagnosis not present

## 2020-10-27 DIAGNOSIS — R55 Syncope and collapse: Secondary | ICD-10-CM

## 2020-10-27 DIAGNOSIS — Z9889 Other specified postprocedural states: Secondary | ICD-10-CM

## 2020-10-27 NOTE — Progress Notes (Signed)
Patient Care Team: Nche, [name], NP as PCP - General (Internal Medicine) Deboraha Sprang, MD as PCP - Electrophysiology (Cardiology) Feliz Beam, MD as Referring Physician (Internal Medicine) Collene Gobble, MD as Consulting Physician (Pulmonary Disease)   HPI  Kristen Suarez is a 75 y.o. female Seen in followup for syncope presumed neurally mediated occurring in the context of known, probably quiescent pulmonary sarcoid   Had an ILR placed in [place] 1/19   Blood pressures 120/70 at  home. The patient denies chest pain,  nocturnal dyspnea, orthopnea or peripheral edema.  There have been no palpitations, lightheadedness or syncope.  C.  Continues with DOE but mych improved with Dr Sudie Bailey efforts pulm rehab etc   She has been found to have renal artery stenosis and is being considered for stenting.  3 unilateral kidney  Her biggest complaint is loss of PEP    Records and Results Reviewed   Past Medical History:  Diagnosis Date   Arthritis    Asthma    CAD in native artery 08/15/2020   Chickenpox    Colon polyps 2010   Heart murmur    History of fainting spells of unknown cause    Hyperlipidemia    Hypertension    Sarcoid 06/16/1976    Past Surgical History:  Procedure Laterality Date   ABDOMINAL HYSTERECTOMY  1987   BASAL CELL CARCINOMA EXCISION  06/30/2020   nose   LUNG BIOPSY  06/16/1976    Current Meds  Medication Sig   acetaminophen (TYLENOL) 500 MG tablet Take 500 mg by mouth every 6 (six) hours as needed.   albuterol (PROVENTIL) (2.5 MG/3ML) 0.083% nebulizer solution USE 1 VIAL VIA NEBULIZER EVERY 6 HOURS   albuterol (VENTOLIN HFA) 108 (90 Base) MCG/ACT inhaler Inhale 2 puffs into the lungs every 6 (six) hours as needed for wheezing or shortness of breath.   amLODipine (NORVASC) 10 MG tablet Take 1 tablet (10 mg total) by mouth daily.   Budeson-Glycopyrrol-Formoterol (BREZTRI AEROSPHERE) 160-9-4.8 MCG/ACT AERO Inhale 2 puffs into the lungs in  the morning and at bedtime.   CALCIUM PO Take 1 tablet by mouth daily.    docusate sodium (COLACE) 100 MG capsule Take 100 mg by mouth daily.    famotidine (PEPCID) 10 MG tablet Take 10 mg by mouth daily. PRN   hydrochlorothiazide (HYDRODIURIL) 25 MG tablet Take 1 tablet (25 mg total) by mouth daily.   irbesartan (AVAPRO) 300 MG tablet Take 1 tablet (300 mg total) by mouth daily.   mometasone (NASONEX) 50 MCG/ACT nasal spray Place 2 sprays into the nose daily.   Multiple Vitamins-Minerals (MULTIVITAL PO) Take 1 tablet by mouth daily.    pantoprazole (PROTONIX) 40 MG tablet TAKE 1 TABLET(40 MG) BY MOUTH DAILY   potassium chloride SA (KLOR-CON) 20 MEQ tablet Take 1 tablet (20 mEq total) by mouth daily.   PREVIDENT 5000 BOOSTER PLUS 1.1 % PSTE SMARTSIG:To Teeth 1 to 2 Times Daily   Probiotic Product (PROBIOTIC DAILY PO) Take 1 tablet by mouth daily.    rosuvastatin (CRESTOR) 20 MG tablet Take 1 tablet (20 mg total) by mouth daily.   spironolactone (ALDACTONE) 25 MG tablet Take 0.5 tablets (12.5 mg total) by mouth daily.    Allergies  Allergen Reactions   Penicillins Itching   Sulfa Antibiotics Itching   Keflex [Cephalexin] Rash      Review of Systems negative except from HPI and PMH  Physical Exam BP (!) 142/80  Pulse 71   Ht '5\' 3"'$  (1.6 m)   Wt 146 lb (66.2 kg)   SpO2 97%   BMI 25.86 kg/m  Well developed and nourished in no acute distress HENT normal Neck supple with JV P-  flat  Clear Regular rate and rhythm, no murmurs or gallops Abd-soft with active BS No Clubbing cyanosis edema Skin-warm and dry A & Oriented  Grossly normal sensory and motor function  ECG sinus @ 71 18/08/37    Assessment and  Plan  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  Blood pressure is much improved.  We  will continue her on her current regime of amlodipine 10 Avapro 300 and HydroDIURIL.  Some of these may be contributing to her lassitude and hopefully following renal artery revascularization for blood pressure issues will improve and some other medications can be selectively discontinued in the hopes of finding a cause for her lassitude.

## 2020-10-27 NOTE — Patient Instructions (Signed)

## 2020-11-07 ENCOUNTER — Encounter: Payer: Self-pay | Admitting: Nurse Practitioner

## 2020-11-08 ENCOUNTER — Other Ambulatory Visit: Payer: Self-pay

## 2020-11-08 ENCOUNTER — Ambulatory Visit (INDEPENDENT_AMBULATORY_CARE_PROVIDER_SITE_OTHER): Payer: Medicare Other | Admitting: Nurse Practitioner

## 2020-11-08 ENCOUNTER — Encounter: Payer: Self-pay | Admitting: Nurse Practitioner

## 2020-11-08 VITALS — BP 124/72 | HR 74 | Temp 97.0°F | Wt 144.8 lb

## 2020-11-08 DIAGNOSIS — M25552 Pain in left hip: Secondary | ICD-10-CM

## 2020-11-08 NOTE — Patient Instructions (Signed)
Apply warm compress before and cold compress after stretch exercise. Avoid exercise with repeated hyperflexion of hip joint. Continue use of tylenol '500mg'$  every6hrs as needed for pain. Call office if no improvement in 4-6weeks  Hip Exercises Ask your health care provider which exercises are safe for you. Do exercises exactly as told by your health care provider and adjust them as directed. It is normal to feel mild stretching, pulling, tightness, or discomfort as you do these exercises. Stop right away if you feel sudden pain or your pain gets worse. Do not begin these exercises until told by your health care provider. Stretching and range-of-motion exercises These exercises warm up your muscles and joints and improve the movement and flexibility of your hip. These exercises also help to relieve pain, numbness, and tingling. You may be asked to limit your range of motion if you had a hipreplacement. Talk to your health care provider about these restrictions. Hamstrings, supine  Lie on your back (supine position). Loop a belt or towel over the ball of your left / right foot. The ball of your foot is on the walking surface, right under your toes. Straighten your left / right knee and slowly pull on the belt or towel to raise your leg until you feel a gentle stretch behind your knee (hamstring). Do not let your knee bend while you do this. Keep your other leg flat on the floor. Hold this position for __________ seconds. Slowly return your leg to the starting position. Repeat __________ times. Complete this exercise __________ times a day. Hip rotation  Lie on your back on a firm surface. With your left / right hand, gently pull your left / right knee toward the shoulder that is on the same side of the body. Stop when your knee is pointing toward the ceiling. Hold your left / right ankle with your other hand. Keeping your knee steady, gently pull your left / right ankle toward your other shoulder  until you feel a stretch in your buttocks. Keep your hips and shoulders firmly planted while you do this stretch. Hold this position for __________ seconds. Repeat __________ times. Complete this exercise __________ times a day. Seated stretch This exercise is sometimes called hamstrings and adductors stretch. Sit on the floor with your legs stretched wide. Keep your knees straight during this exercise. Keeping your head and back in a straight line, bend at your waist to reach for your left foot (position A). You should feel a stretch in your right inner thigh (adductors). Hold this position for __________ seconds. Then slowly return to the upright position. Keeping your head and back in a straight line, bend at your waist to reach forward (position B). You should feel a stretch behind both of your thighs and knees (hamstrings). Hold this position for __________ seconds. Then slowly return to the upright position. Keeping your head and back in a straight line, bend at your waist to reach for your right foot (position C). You should feel a stretch in your left inner thigh (adductors). Hold this position for __________ seconds. Then slowly return to the upright position. Repeat __________ times. Complete this exercise __________ times a day. Lunge This exercise stretches the muscles of the hip (hip flexors). Place your left / right knee on the floor and bend your other knee so that is directly over your ankle. You should be half-kneeling. Keep good posture with your head over your shoulders. Tighten your buttocks to point your tailbone downward. This will  prevent your back from arching too much. You should feel a gentle stretch in the front of your left / right thigh and hip. If you do not feel a stretch, slide your other foot forward slightly and then slowly lunge forward with your chest up until your knee once again lines up over your ankle. Make sure your tailbone continues to point  downward. Hold this position for __________ seconds. Slowly return to the starting position. Repeat __________ times. Complete this exercise __________ times a day. Strengthening exercises These exercises build strength and endurance in your hip. Endurance is theability to use your muscles for a long time, even after they get tired. Bridge This exercise strengthens the muscles of your hip (hip extensors). Lie on your back on a firm surface with your knees bent and your feet flat on the floor. Tighten your buttocks muscles and lift your bottom off the floor until the trunk of your body and your hips are level with your thighs. Do not arch your back. You should feel the muscles working in your buttocks and the back of your thighs. If you do not feel these muscles, slide your feet 1-2 inches (2.5-5 cm) farther away from your buttocks. Hold this position for __________ seconds. Slowly lower your hips to the starting position. Let your muscles relax completely between repetitions. Repeat __________ times. Complete this exercise __________ times a day. Straight leg raises, side-lying This exercise strengthens the muscles that move the hip joint away from the center of the body (hip abductors). Lie on your side with your left / right leg in the top position. Lie so your head, shoulder, hip, and knee line up. You may bend your bottom knee slightly to help you balance. Roll your hips slightly forward, so your hips are stacked directly over each other and your left / right knee is facing forward. Leading with your heel, lift your top leg 4-6 inches (10-15 cm). You should feel the muscles in your top hip lifting. Do not let your foot drift forward. Do not let your knee roll toward the ceiling. Hold this position for __________ seconds. Slowly return to the starting position. Let your muscles relax completely between repetitions. Repeat __________ times. Complete this exercise __________ times a  day. Straight leg raises, side-lying This exercise strengthens the muscles that move the hip joint toward the center of the body (hip adductors). Lie on your side with your left / right leg in the bottom position. Lie so your head, shoulder, hip, and knee line up. You may place your upper foot in front to help you balance. Roll your hips slightly forward, so your hips are stacked directly over each other and your left / right knee is facing forward. Tense the muscles in your inner thigh and lift your bottom leg 4-6 inches (10-15 cm). Hold this position for __________ seconds. Slowly return to the starting position. Let your muscles relax completely between repetitions. Repeat __________ times. Complete this exercise __________ times a day. Straight leg raises, supine This exercise strengthens the muscles in the front of your thigh (quadriceps). Lie on your back (supine position) with your left / right leg extended and your other knee bent. Tense the muscles in the front of your left / right thigh. You should see your kneecap slide up or see increased dimpling just above your knee. Keep these muscles tight as you raise your leg 4-6 inches (10-15 cm) off the floor. Do not let your knee bend. Hold this position  for __________ seconds. Keep these muscles tense as you lower your leg. Relax the muscles slowly and completely between repetitions. Repeat __________ times. Complete this exercise __________ times a day. Hip abductors, standing This exercise strengthens the muscles that move the leg and hip joint away from the center of the body (hip abductors). Tie one end of a rubber exercise band or tubing to a secure surface, such as a chair, table, or pole. Loop the other end of the band or tubing around your left / right ankle. Keeping your ankle with the band or tubing directly opposite the secured end, step away until there is tension in the tubing or band. Hold on to a chair, table, or pole as  needed for balance. Lift your left / right leg out to your side. While you do this: Keep your back upright. Keep your shoulders over your hips. Keep your toes pointing forward. Make sure to use your hip muscles to slowly lift your leg. Do not tip your body or forcefully lift your leg. Hold this position for __________ seconds. Slowly return to the starting position. Repeat __________ times. Complete this exercise __________ times a day. Squats This exercise strengthens the muscles in the front of your thigh (quadriceps). Stand in a door frame so your feet and knees are in line with the frame. You may place your hands on the frame for balance. Slowly bend your knees and lower your hips like you are going to sit in a chair. Keep your lower legs in a straight-up-and-down position. Do not let your hips go lower than your knees. Do not bend your knees lower than told by your health care provider. If your hip pain increases, do not bend as low. Hold this position for ___________ seconds. Slowly push with your legs to return to standing. Do not use your hands to pull yourself to standing. Repeat __________ times. Complete this exercise __________ times a day. This information is not intended to replace advice given to you by your health care provider. Make sure you discuss any questions you have with your healthcare provider. Document Revised: 10/08/2018 Document Reviewed: 01/13/2018 Elsevier Patient Education  2022 [employer].

## 2020-11-08 NOTE — Progress Notes (Signed)
Subjective:  Patient ID: Kristen Suarez, female    DOB: Dec 09, 1945  Age: 75 y.o. MRN: [id number]  CC: Acute Visit (Pt c/o left side groin pain x 3 daysthat is relieved when resting. Pt has used tylenol and ice packs to help. Pain will go away and comes back. )  Hip Pain  The incident occurred 3 to 5 days ago. The incident occurred at home. The injury mechanism was a twisting injury. The pain is present in the left hip. The quality of the pain is described as aching. The pain is moderate. The pain has been Intermittent since onset. Associated symptoms include an inability to bear weight. Pertinent negatives include no loss of motion, loss of sensation, muscle weakness, numbness or tingling. She reports no foreign bodies present. The symptoms are aggravated by movement and weight bearing. She has tried heat, ice, rest and acetaminophen for the symptoms. The treatment provided mild relief.  Onset after working in garden Saturday. Maintain kneeling position for several hours No change in GI/GU function, no rash, no back pain.  Reviewed past Medical, Social and Family history today.  Outpatient Medications Prior to Visit  Medication Sig Dispense Refill   acetaminophen (TYLENOL) 500 MG tablet Take 500 mg by mouth every 6 (six) hours as needed.     albuterol (PROVENTIL) (2.5 MG/3ML) 0.083% nebulizer solution USE 1 VIAL VIA NEBULIZER EVERY 6 HOURS 150 mL 5   albuterol (VENTOLIN HFA) 108 (90 Base) MCG/ACT inhaler Inhale 2 puffs into the lungs every 6 (six) hours as needed for wheezing or shortness of breath. 8 g 6   amLODipine (NORVASC) 10 MG tablet Take 1 tablet (10 mg total) by mouth daily. 90 tablet 3   Budeson-Glycopyrrol-Formoterol (BREZTRI AEROSPHERE) 160-9-4.8 MCG/ACT AERO Inhale 2 puffs into the lungs in the morning and at bedtime. 32.1 g 2   CALCIUM PO Take 1 tablet by mouth daily.      docusate sodium (COLACE) 100 MG capsule Take 100 mg by mouth daily.      famotidine (PEPCID) 10 MG tablet  Take 10 mg by mouth daily. PRN     hydrochlorothiazide (HYDRODIURIL) 25 MG tablet Take 1 tablet (25 mg total) by mouth daily. 90 tablet 3   irbesartan (AVAPRO) 300 MG tablet Take 1 tablet (300 mg total) by mouth daily. 90 tablet 3   mometasone (NASONEX) 50 MCG/ACT nasal spray Place 2 sprays into the nose daily. 1 each 3   Multiple Vitamins-Minerals (MULTIVITAL PO) Take 1 tablet by mouth daily.      pantoprazole (PROTONIX) 40 MG tablet TAKE 1 TABLET(40 MG) BY MOUTH DAILY 30 tablet 5   potassium chloride SA (KLOR-CON) 20 MEQ tablet Take 1 tablet (20 mEq total) by mouth daily. 90 tablet 3   PREVIDENT 5000 BOOSTER PLUS 1.1 % PSTE SMARTSIG:To Teeth 1 to 2 Times Daily     Probiotic Product (PROBIOTIC DAILY PO) Take 1 tablet by mouth daily.      rosuvastatin (CRESTOR) 20 MG tablet Take 1 tablet (20 mg total) by mouth daily. 90 tablet 3   spironolactone (ALDACTONE) 25 MG tablet Take 0.5 tablets (12.5 mg total) by mouth daily. 30 tablet 3   No facility-administered medications prior to visit.    ROS See HPI  Objective:  BP 124/72 (BP Location: Left Arm, Patient Position: Sitting, Cuff Size: Large)   Pulse 74   Temp (!) 97 F (36.1 C) (Temporal)   Wt 144 lb 12.8 oz (65.7 kg)   SpO2 97%  BMI 25.65 kg/m   Physical Exam Vitals reviewed.  Constitutional:      General: She is not in acute distress. Pulmonary:     Effort: Pulmonary effort is normal.  Abdominal:     General: There is no distension.     Palpations: Abdomen is soft.     Tenderness: There is no abdominal tenderness. There is no right CVA tenderness, left CVA tenderness or guarding.  Musculoskeletal:     Lumbar back: Normal.     Right hip: Normal.     Left hip: Tenderness present. No bony tenderness or crepitus. Decreased range of motion. Decreased strength.     Right upper leg: Normal.     Left upper leg: Normal.     Right knee: Normal.     Left knee: Normal.     Right lower leg: No edema.     Left lower leg: No edema.      Comments: Anterior hip pain with external rotation and hip flexion. No inguinal hernia nor lymphadenopathy  Skin:    Findings: No erythema or rash.  Neurological:     Mental Status: She is alert.   Assessment & Plan:  This visit occurred during the SARS-CoV-2 public health emergency.  Safety protocols were in place, including screening questions prior to the visit, additional usage of staff PPE, and extensive cleaning of exam room while observing appropriate contact time as indicated for disinfecting solutions.   Caress was seen today for acute visit.  Diagnoses and all orders for this visit:  Acute hip pain, left  Avoid NSAIDs due to R. Kidney RAS at 60% Apply warm compress before and cold compress after stretch exercise. Avoid exercise with repeated hyperflexion of hip joint. Continue use of tylenol '500mg'$  every6hrs as needed for pain. Call office if no improvement in 4-6weeks  Problem List Items Addressed This Visit   None Visit Diagnoses     Acute hip pain, left    -  Primary       Follow-up: No follow-ups on file.  Wilfred Lacy, NP

## 2020-11-10 NOTE — Progress Notes (Signed)
Carelink Summary Report / Loop Recorder 

## 2020-11-21 ENCOUNTER — Ambulatory Visit (INDEPENDENT_AMBULATORY_CARE_PROVIDER_SITE_OTHER): Payer: Medicare Other | Admitting: Cardiovascular Disease

## 2020-11-21 ENCOUNTER — Encounter: Payer: Self-pay | Admitting: Cardiovascular Disease

## 2020-11-21 ENCOUNTER — Other Ambulatory Visit: Payer: Self-pay

## 2020-11-21 VITALS — BP 124/80 | HR 78 | Ht 63.0 in | Wt 144.0 lb

## 2020-11-21 DIAGNOSIS — E7800 Pure hypercholesterolemia, unspecified: Secondary | ICD-10-CM

## 2020-11-21 DIAGNOSIS — I701 Atherosclerosis of renal artery: Secondary | ICD-10-CM

## 2020-11-21 DIAGNOSIS — Z01812 Encounter for preprocedural laboratory examination: Secondary | ICD-10-CM

## 2020-11-21 DIAGNOSIS — I150 Renovascular hypertension: Secondary | ICD-10-CM | POA: Diagnosis not present

## 2020-11-21 DIAGNOSIS — Z01818 Encounter for other preprocedural examination: Secondary | ICD-10-CM | POA: Diagnosis not present

## 2020-11-21 LAB — COMPREHENSIVE METABOLIC PANEL
ALT: 21 IU/L (ref 0–32)
AST: 19 IU/L (ref 0–40)
Albumin/Globulin Ratio: 2.2 (ref 1.2–2.2)
Albumin: 4.8 g/dL — ABNORMAL HIGH (ref 3.7–4.7)
Alkaline Phosphatase: 106 IU/L (ref 44–121)
BUN/Creatinine Ratio: 23 (ref 12–28)
BUN: 31 mg/dL — ABNORMAL HIGH (ref 8–27)
Bilirubin Total: 0.4 mg/dL (ref 0.0–1.2)
CO2: 23 mmol/L (ref 20–29)
Calcium: 10.1 mg/dL (ref 8.7–10.3)
Chloride: 96 mmol/L (ref 96–106)
Creatinine, Ser: 1.37 mg/dL — ABNORMAL HIGH (ref 0.57–1.00)
Globulin, Total: 2.2 g/dL (ref 1.5–4.5)
Glucose: 93 mg/dL (ref 65–99)
Potassium: 5 mmol/L (ref 3.5–5.2)
Sodium: 137 mmol/L (ref 134–144)
Total Protein: 7 g/dL (ref 6.0–8.5)
eGFR: 40 mL/min/{1.73_m2} — ABNORMAL LOW (ref >59)

## 2020-11-21 LAB — CBC
Hematocrit: 39.8 % (ref 34.0–46.6)
Hemoglobin: 13.5 g/dL (ref 11.1–15.9)
MCH: 30.1 pg (ref 26.6–33.0)
MCHC: 33.9 g/dL (ref 31.5–35.7)
MCV: 89 fL (ref 79–97)
Platelets: 245 10*3/uL (ref 150–450)
RBC: 4.48 x10E6/uL (ref 3.77–5.28)
RDW: 13 % (ref 11.7–15.4)
WBC: 6 10*3/uL (ref 3.4–10.8)

## 2020-11-21 LAB — LIPID PANEL
Chol/HDL Ratio: 4.2 ratio (ref 0.0–4.4)
Cholesterol, Total: 209 mg/dL — ABNORMAL HIGH (ref 100–199)
HDL: 50 mg/dL (ref >39)
LDL Chol Calc (NIH): 128 mg/dL — ABNORMAL HIGH (ref 0–99)
Triglycerides: 175 mg/dL — ABNORMAL HIGH (ref 0–149)
VLDL Cholesterol Cal: 31 mg/dL (ref 5–40)

## 2020-11-21 NOTE — Patient Instructions (Addendum)
  [facility] [address] [place] [address] Dept: [phone] Loc: [phone]  Kristen Suarez  11/21/2020  You are scheduled for a Peripheral Angiogram on Wednesday, September 14 with Dr. Kathlyn Sacramento.  1. Please arrive at the [facility] (Main Entrance A) at [facility]: [place], [place] [address] at 6:00 AM (This time is two hours before your procedure to ensure your preparation). Free valet parking service is available.   Special note: Every effort is made to have your procedure done on time. Please understand that emergencies sometimes delay scheduled procedures.  2. Diet: Do not eat solid foods after midnight.  The patient may have clear liquids until 5am upon the day of the procedure.  3. Labs: Today in office  4. Medication instructions in preparation for your procedure:   Contrast Allergy: No  Hold irbesartan the day before and day of procedure  Hold spironolactone day of procedure Hold HCTZ day of procedure   On the morning of your procedure, take your Aspirin and any morning medicines NOT listed above.  You may use sips of water.  5. Plan for one night stay--bring personal belongings. 6. Bring a current list of your medications and current insurance cards. 7. You MUST have a responsible person to drive you home. 8. Someone MUST be with you the first 24 hours after you arrive home or your discharge will be delayed. 9. Please wear clothes that are easy to get on and off and wear slip-on shoes.  Thank you for allowing [place] to care for you!   -- [facility]     Follow up with Dr. Fletcher Anon 10/4 at 9 AM as scheduled.

## 2020-11-21 NOTE — Progress Notes (Signed)
Cardiology Office Note   Date:  11/21/2020   ID:  [name], DOB 1945/08/04, MRN [id number]  PCP:  Flossie Buffy, NP  Cardiologist: Dr. Oval Linsey  Chief Complaint  Patient presents with   Follow-up       History of Present Illness: Kristen Suarez is a 75 y.o. female who is here today for follow-up visit regarding renovascular hypertension.   She has known history of hypertension, coronary calcifications noted on previous CT scan, pulmonary sarcoidosis and asthma.  She was diagnosed with hypertension 20 years ago but had significant elevation in blood pressure over the last 6 months.    Her blood pressure improved after adjusting her antihypertensive medications.  She has mild chronic kidney disease .  Most recently, spironolactone was added and since then her blood pressure has been controlled.  She feels well with no chest pain or shortness of breath.  She underwent renal artery duplex in June which showed significant right renal artery stenosis with peak velocity of 291 with a bruit.  No flow was detected into the left renal artery.  Right kidney size was 10.6 cm and left kidney was 8 cm.  Her blood pressure is now controlled with 4 antihypertensive medications.  However, this came at the expense of declining renal function with GFR going down from 50 to 41.  In addition, she describes progressive fatigue and low energy that correlated with addition of antihypertensive medications.  No chest pain or shortness of breath.   Past Medical History:  Diagnosis Date   Arthritis    Asthma    CAD in native artery 08/15/2020   Chickenpox    Colon polyps 2010   Heart murmur    History of fainting spells of unknown cause    Hyperlipidemia    Hypertension    Sarcoid 06/16/1976    Past Surgical History:  Procedure Laterality Date   ABDOMINAL HYSTERECTOMY  1987   BASAL CELL CARCINOMA EXCISION  06/30/2020   nose   LUNG BIOPSY  06/16/1976     Current Outpatient  Medications  Medication Sig Dispense Refill   acetaminophen (TYLENOL) 500 MG tablet Take 500 mg by mouth every 6 (six) hours as needed.     albuterol (PROVENTIL) (2.5 MG/3ML) 0.083% nebulizer solution USE 1 VIAL VIA NEBULIZER EVERY 6 HOURS 150 mL 5   albuterol (VENTOLIN HFA) 108 (90 Base) MCG/ACT inhaler Inhale 2 puffs into the lungs every 6 (six) hours as needed for wheezing or shortness of breath. 8 g 6   amLODipine (NORVASC) 10 MG tablet Take 1 tablet (10 mg total) by mouth daily. 90 tablet 3   Budeson-Glycopyrrol-Formoterol (BREZTRI AEROSPHERE) 160-9-4.8 MCG/ACT AERO Inhale 2 puffs into the lungs in the morning and at bedtime. 32.1 g 2   CALCIUM PO Take 1 tablet by mouth daily.      docusate sodium (COLACE) 100 MG capsule Take 100 mg by mouth daily.      famotidine (PEPCID) 10 MG tablet Take 10 mg by mouth daily. PRN     hydrochlorothiazide (HYDRODIURIL) 25 MG tablet Take 1 tablet (25 mg total) by mouth daily. 90 tablet 3   irbesartan (AVAPRO) 300 MG tablet Take 1 tablet (300 mg total) by mouth daily. 90 tablet 3   mometasone (NASONEX) 50 MCG/ACT nasal spray Place 2 sprays into the nose daily. 1 each 3   Multiple Vitamins-Minerals (MULTIVITAL PO) Take 1 tablet by mouth daily.      pantoprazole (PROTONIX) 40 MG tablet TAKE 1  TABLET(40 MG) BY MOUTH DAILY 30 tablet 5   potassium chloride SA (KLOR-CON) 20 MEQ tablet Take 1 tablet (20 mEq total) by mouth daily. 90 tablet 3   PREVIDENT 5000 BOOSTER PLUS 1.1 % PSTE SMARTSIG:To Teeth 1 to 2 Times Daily     Probiotic Product (PROBIOTIC DAILY PO) Take 1 tablet by mouth daily.      spironolactone (ALDACTONE) 25 MG tablet Take 0.5 tablets (12.5 mg total) by mouth daily. 30 tablet 3   rosuvastatin (CRESTOR) 20 MG tablet Take 1 tablet (20 mg total) by mouth daily. 90 tablet 3   No current facility-administered medications for this visit.    Allergies:   Penicillins, Sulfa antibiotics, and Keflex [cephalexin]    Social History:  The patient  reports  that she has never smoked. She has never used smokeless tobacco. She reports current alcohol use. She reports that she does not use drugs.   Family History:  The patient's family history includes Alcohol abuse in her brother, brother, father, and sister; Arthritis in her mother; Cancer in her mother; Depression in her mother; Early death in her brother; Heart attack in her brother and mother; Heart disease in her brother, brother, father, maternal grandfather, paternal grandfather, and sister; Hyperlipidemia in her brother, brother, father, mother, sister, sister, and son; Hypertension in her brother, father, mother, and sister; Multiple sclerosis in her daughter; Stroke in her sister.    ROS:  Please see the history of present illness.   Otherwise, review of systems are positive for none.   All other systems are reviewed and negative.    PHYSICAL EXAM: VS:  BP 124/80 (BP Location: Right Arm, Patient Position: Sitting, Cuff Size: Normal)   Pulse 78   Ht '5\' 3"'$  (1.6 m)   Wt 144 lb (65.3 kg)   BMI 25.51 kg/m  , BMI Body mass index is 25.51 kg/m. GEN: Well nourished, well developed, in no acute distress  HEENT: normal  Neck: no JVD, carotid bruits, or masses Cardiac: RRR; no murmurs, rubs, or gallops,no edema  Respiratory:  clear to auscultation bilaterally, normal work of breathing GI: soft, nontender, nondistended, + BS MS: no deformity or atrophy  Skin: warm and dry, no rash Neuro:  Strength and sensation are intact Psych: euthymic mood, full affect Vascular: Femoral pulses normal.   EKG:  EKG is not ordered today.    Recent Labs: 02/29/2020: Hemoglobin 13.9; Platelets 313.0; TSH 2.32 08/22/2020: ALT 25 10/24/2020: BUN 38; Creatinine, Ser 1.35; Potassium 4.2; Sodium 139    Lipid Panel    Component Value Date/Time   CHOL 168 08/22/2020 1104   TRIG 189 (H) 08/22/2020 1104   HDL 50 08/22/2020 1104   CHOLHDL 3.4 08/22/2020 1104   CHOLHDL 3 07/28/2020 0840   VLDL 23.8 07/28/2020  0840   LDLCALC 86 08/22/2020 1104      Wt Readings from Last 3 Encounters:  11/21/20 144 lb (65.3 kg)  11/08/20 144 lb 12.8 oz (65.7 kg)  10/27/20 146 lb (66.2 kg)      No flowsheet data found.    ASSESSMENT AND PLAN:  1.  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  Although her blood pressure is now well controlled on 4 antihypertensive medications, I am very concerned about the relatively quick decline of renal function.  This could be due to ischemic nephropathy in the setting of antihypertensive  medications.   Due to this, I recommend proceeding with renal artery angiography and possible angioplasty/stent placement.  I discussed the procedure in details as well as risk and benefits.  I did explain to her the increased risk of contrast-induced nephropathy but will try to minimize contrast and will hydrate before and after the procedure. Hold hydrochlorothiazide and spironolactone the morning of the procedure.  2.  Hyperlipidemia: The patient is tolerating current dose of rosuvastatin 20 mg daily.    Disposition:   Proceed with renal artery angiography next week.  Follow-up with me in 1 month.  Signed,  Kathlyn Sacramento, MD  11/21/2020 9:58 AM    [facility]

## 2020-11-21 NOTE — H&P (View-Only) (Signed)
Cardiology Office Note   Date:  11/21/2020   ID:  [name], DOB [birth year], MRN [id number]  PCP:  Flossie Buffy, NP  Cardiologist: Dr. Oval Linsey  Chief Complaint  Patient presents with   Follow-up       History of Present Illness: Sakinah Gustaveson is a 75 y.o. female who is here today for follow-up visit regarding renovascular hypertension.   She has known history of hypertension, coronary calcifications noted on previous CT scan, pulmonary sarcoidosis and asthma.  She was diagnosed with hypertension 20 years ago but had significant elevation in blood pressure over the last 6 months.    Her blood pressure improved after adjusting her antihypertensive medications.  She has mild chronic kidney disease .  Most recently, spironolactone was added and since then her blood pressure has been controlled.  She feels well with no chest pain or shortness of breath.  She underwent renal artery duplex in June which showed significant right renal artery stenosis with peak velocity of 291 with a bruit.  No flow was detected into the left renal artery.  Right kidney size was 10.6 cm and left kidney was 8 cm.  Her blood pressure is now controlled with 4 antihypertensive medications.  However, this came at the expense of declining renal function with GFR going down from 50 to 41.  In addition, she describes progressive fatigue and low energy that correlated with addition of antihypertensive medications.  No chest pain or shortness of breath.   Past Medical History:  Diagnosis Date   Arthritis    Asthma    CAD in native artery 08/15/2020   Chickenpox    Colon polyps 2010   Heart murmur    History of fainting spells of unknown cause    Hyperlipidemia    Hypertension    Sarcoid 06/16/1976    Past Surgical History:  Procedure Laterality Date   ABDOMINAL HYSTERECTOMY  1987   BASAL CELL CARCINOMA EXCISION  06/30/2020   nose   LUNG BIOPSY  06/16/1976     Current Outpatient  Medications  Medication Sig Dispense Refill   acetaminophen (TYLENOL) 500 MG tablet Take 500 mg by mouth every 6 (six) hours as needed.     albuterol (PROVENTIL) (2.5 MG/3ML) 0.083% nebulizer solution USE 1 VIAL VIA NEBULIZER EVERY 6 HOURS 150 mL 5   albuterol (VENTOLIN HFA) 108 (90 Base) MCG/ACT inhaler Inhale 2 puffs into the lungs every 6 (six) hours as needed for wheezing or shortness of breath. 8 g 6   amLODipine (NORVASC) 10 MG tablet Take 1 tablet (10 mg total) by mouth daily. 90 tablet 3   Budeson-Glycopyrrol-Formoterol (BREZTRI AEROSPHERE) 160-9-4.8 MCG/ACT AERO Inhale 2 puffs into the lungs in the morning and at bedtime. 32.1 g 2   CALCIUM PO Take 1 tablet by mouth daily.      docusate sodium (COLACE) 100 MG capsule Take 100 mg by mouth daily.      famotidine (PEPCID) 10 MG tablet Take 10 mg by mouth daily. PRN     hydrochlorothiazide (HYDRODIURIL) 25 MG tablet Take 1 tablet (25 mg total) by mouth daily. 90 tablet 3   irbesartan (AVAPRO) 300 MG tablet Take 1 tablet (300 mg total) by mouth daily. 90 tablet 3   mometasone (NASONEX) 50 MCG/ACT nasal spray Place 2 sprays into the nose daily. 1 each 3   Multiple Vitamins-Minerals (MULTIVITAL PO) Take 1 tablet by mouth daily.      pantoprazole (PROTONIX) 40 MG tablet TAKE 1  TABLET(40 MG) BY MOUTH DAILY 30 tablet 5   potassium chloride SA (KLOR-CON) 20 MEQ tablet Take 1 tablet (20 mEq total) by mouth daily. 90 tablet 3   PREVIDENT 5000 BOOSTER PLUS 1.1 % PSTE SMARTSIG:To Teeth 1 to 2 Times Daily     Probiotic Product (PROBIOTIC DAILY PO) Take 1 tablet by mouth daily.      spironolactone (ALDACTONE) 25 MG tablet Take 0.5 tablets (12.5 mg total) by mouth daily. 30 tablet 3   rosuvastatin (CRESTOR) 20 MG tablet Take 1 tablet (20 mg total) by mouth daily. 90 tablet 3   No current facility-administered medications for this visit.    Allergies:   Penicillins, Sulfa antibiotics, and Keflex [cephalexin]    Social History:  The patient  reports  that she has never smoked. She has never used smokeless tobacco. She reports current alcohol use. She reports that she does not use drugs.   Family History:  The patient's family history includes Alcohol abuse in her brother, brother, father, and sister; Arthritis in her mother; Cancer in her mother; Depression in her mother; Early death in her brother; Heart attack in her brother and mother; Heart disease in her brother, brother, father, maternal grandfather, paternal grandfather, and sister; Hyperlipidemia in her brother, brother, father, mother, sister, sister, and son; Hypertension in her brother, father, mother, and sister; Multiple sclerosis in her daughter; Stroke in her sister.    ROS:  Please see the history of present illness.   Otherwise, review of systems are positive for none.   All other systems are reviewed and negative.    PHYSICAL EXAM: VS:  BP 124/80 (BP Location: Right Arm, Patient Position: Sitting, Cuff Size: Normal)   Pulse 78   Ht '5\' 3"'$  (1.6 m)   Wt 144 lb (65.3 kg)   BMI 25.51 kg/m  , BMI Body mass index is 25.51 kg/m. GEN: Well nourished, well developed, in no acute distress  HEENT: normal  Neck: no JVD, carotid bruits, or masses Cardiac: RRR; no murmurs, rubs, or gallops,no edema  Respiratory:  clear to auscultation bilaterally, normal work of breathing GI: soft, nontender, nondistended, + BS MS: no deformity or atrophy  Skin: warm and dry, no rash Neuro:  Strength and sensation are intact Psych: euthymic mood, full affect Vascular: Femoral pulses normal.   EKG:  EKG is not ordered today.    Recent Labs: 02/29/2020: Hemoglobin 13.9; Platelets 313.0; TSH 2.32 08/22/2020: ALT 25 10/24/2020: BUN 38; Creatinine, Ser 1.35; Potassium 4.2; Sodium 139    Lipid Panel    Component Value Date/Time   CHOL 168 08/22/2020 1104   TRIG 189 (H) 08/22/2020 1104   HDL 50 08/22/2020 1104   CHOLHDL 3.4 08/22/2020 1104   CHOLHDL 3 07/28/2020 0840   VLDL 23.8 07/28/2020  0840   LDLCALC 86 08/22/2020 1104      Wt Readings from Last 3 Encounters:  11/21/20 144 lb (65.3 kg)  11/08/20 144 lb 12.8 oz (65.7 kg)  10/27/20 146 lb (66.2 kg)      No flowsheet data found.    ASSESSMENT AND PLAN:  1.  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  Although her blood pressure is now well controlled on 4 antihypertensive medications, I am very concerned about the relatively quick decline of renal function.  This could be due to ischemic nephropathy in the setting of antihypertensive  medications.   Due to this, I recommend proceeding with renal artery angiography and possible angioplasty/stent placement.  I discussed the procedure in details as well as risk and benefits.  I did explain to her the increased risk of contrast-induced nephropathy but will try to minimize contrast and will hydrate before and after the procedure. Hold hydrochlorothiazide and spironolactone the morning of the procedure.  2.  Hyperlipidemia: The patient is tolerating current dose of rosuvastatin 20 mg daily.    Disposition:   Proceed with renal artery angiography next week.  Follow-up with me in 1 month.  Signed,  Kathlyn Sacramento, MD  11/21/2020 9:58 AM    [facility]

## 2020-11-27 ENCOUNTER — Ambulatory Visit (INDEPENDENT_AMBULATORY_CARE_PROVIDER_SITE_OTHER): Payer: Medicare Other

## 2020-11-27 DIAGNOSIS — R55 Syncope and collapse: Secondary | ICD-10-CM

## 2020-11-27 LAB — CUP PACEART REMOTE DEVICE CHECK
Date Time Interrogation Session: 20220910014326
Implantable Pulse Generator Implant Date: 20190109

## 2020-11-28 ENCOUNTER — Telehealth: Payer: Self-pay | Admitting: *Deleted

## 2020-11-28 NOTE — Telephone Encounter (Signed)
Renal angiography scheduled at [facility] for: Wednesday November 29, 2020 10:30 AM [facility] Main Entrance [facility]) at: 6 AM -pre-procedure hydration   No solid food after midnight prior to cath, clear liquids until 5 AM day of procedure.  Medication instructions: Hold: -avapro-day before and day of procedure-GFR 40 -Spironolactone/HCTZ/KCl-AM of procedure-already taken today  Except hold medications morning medications can be taken pre-cath with sips of water including aspirin 81 mg.    Confirmed patient has responsible adult to drive home post procedure and be with patient first 24 hours after arriving home.  Patients are allowed one visitor in the waiting room during the time they are at the hospital for their procedure. Both patient and visitor must wear a mask once they enter the hospital.   Patient reports does not currently have any symptoms concerning for COVID-19 and no household members with COVID-19 like illness.   Reviewed procedure/mask/visitor instructions with patient, discussed pre-procedure hydration.

## 2020-11-29 ENCOUNTER — Other Ambulatory Visit: Payer: Self-pay

## 2020-11-29 ENCOUNTER — Ambulatory Visit (HOSPITAL_COMMUNITY)
Admission: RE | Admit: 2020-11-29 | Discharge: 2020-11-29 | Disposition: A | Discharge status: Home / Self Care | Payer: Medicare Other | Source: Ambulatory Visit | Attending: Cardiovascular Disease | Admitting: Cardiovascular Disease

## 2020-11-29 ENCOUNTER — Ambulatory Visit (HOSPITAL_COMMUNITY)
Admission: RE | Disposition: A | Discharge status: Home / Self Care | Payer: Self-pay | Source: Ambulatory Visit | Attending: Cardiovascular Disease

## 2020-11-29 DIAGNOSIS — Z79899 Other long term (current) drug therapy: Secondary | ICD-10-CM | POA: Insufficient documentation

## 2020-11-29 DIAGNOSIS — I701 Atherosclerosis of renal artery: Secondary | ICD-10-CM | POA: Diagnosis present

## 2020-11-29 DIAGNOSIS — I129 Hypertensive chronic kidney disease with stage 1 through stage 4 chronic kidney disease, or unspecified chronic kidney disease: Secondary | ICD-10-CM | POA: Insufficient documentation

## 2020-11-29 DIAGNOSIS — Z8249 Family history of ischemic heart disease and other diseases of the circulatory system: Secondary | ICD-10-CM | POA: Diagnosis not present

## 2020-11-29 DIAGNOSIS — Z880 Allergy status to penicillin: Secondary | ICD-10-CM | POA: Insufficient documentation

## 2020-11-29 DIAGNOSIS — N189 Chronic kidney disease, unspecified: Secondary | ICD-10-CM | POA: Diagnosis not present

## 2020-11-29 DIAGNOSIS — Z882 Allergy status to sulfonamides status: Secondary | ICD-10-CM | POA: Diagnosis not present

## 2020-11-29 DIAGNOSIS — E785 Hyperlipidemia, unspecified: Secondary | ICD-10-CM | POA: Diagnosis not present

## 2020-11-29 DIAGNOSIS — Z881 Allergy status to other antibiotic agents status: Secondary | ICD-10-CM | POA: Diagnosis not present

## 2020-11-29 HISTORY — PX: PERIPHERAL VASCULAR INTERVENTION: CATH118257

## 2020-11-29 HISTORY — PX: RENAL ANGIOGRAPHY: CATH118260

## 2020-11-29 LAB — POCT ACTIVATED CLOTTING TIME: Activated Clotting Time: 277 seconds

## 2020-11-29 SURGERY — RENAL ANGIOGRAPHY
Anesthesia: LOCAL | Laterality: Right

## 2020-11-29 MED ORDER — SODIUM CHLORIDE 0.9% FLUSH: 3.0000 mL | INTRAVENOUS | Status: DC | PRN

## 2020-11-29 MED ORDER — ASPIRIN 81 MG PO CHEW: 81.0000 mg | CHEWABLE_TABLET | ORAL | Status: DC

## 2020-11-29 MED ORDER — HEPARIN (PORCINE) IN NACL 1000-0.9 UT/500ML-% IV SOLN
INTRAVENOUS | Status: DC | PRN
  Administered 2020-11-29 (×2): 500 mL

## 2020-11-29 MED ORDER — SODIUM CHLORIDE 0.9 % IV SOLN: INTRAVENOUS | Status: DC

## 2020-11-29 MED ORDER — MIDAZOLAM HCL 2 MG/2ML IJ SOLN
INTRAMUSCULAR | Status: AC
  Filled 2020-11-29: qty 2

## 2020-11-29 MED ORDER — ASPIRIN EC 81 MG PO TBEC: 81.0000 mg | DELAYED_RELEASE_TABLET | Freq: Every day | ORAL | 2 refills | Status: AC

## 2020-11-29 MED ORDER — FENTANYL CITRATE (PF) 100 MCG/2ML IJ SOLN
INTRAMUSCULAR | Status: AC
  Filled 2020-11-29: qty 2

## 2020-11-29 MED ORDER — FENTANYL CITRATE (PF) 100 MCG/2ML IJ SOLN
INTRAMUSCULAR | Status: DC | PRN
  Administered 2020-11-29 (×2): 25 ug via INTRAVENOUS

## 2020-11-29 MED ORDER — LIDOCAINE HCL (PF) 1 % IJ SOLN
INTRAMUSCULAR | Status: DC | PRN
  Administered 2020-11-29: 15 mL via INTRADERMAL

## 2020-11-29 MED ORDER — HEPARIN (PORCINE) IN NACL 1000-0.9 UT/500ML-% IV SOLN
INTRAVENOUS | Status: AC
  Filled 2020-11-29: qty 1000

## 2020-11-29 MED ORDER — LIDOCAINE HCL (PF) 1 % IJ SOLN
INTRAMUSCULAR | Status: AC
  Filled 2020-11-29: qty 30

## 2020-11-29 MED ORDER — IODIXANOL 320 MG/ML IV SOLN
INTRAVENOUS | Status: DC | PRN
  Administered 2020-11-29: 60 mL via INTRA_ARTERIAL

## 2020-11-29 MED ORDER — ONDANSETRON HCL 4 MG/2ML IJ SOLN: 4.0000 mg | Freq: Four times a day (QID) | INTRAMUSCULAR | Status: DC | PRN

## 2020-11-29 MED ORDER — ACETAMINOPHEN 325 MG PO TABS: 650.0000 mg | ORAL_TABLET | ORAL | Status: DC | PRN

## 2020-11-29 MED ORDER — SODIUM CHLORIDE 0.9 % IV SOLN: 250.0000 mL | INTRAVENOUS | Status: DC | PRN

## 2020-11-29 MED ORDER — MIDAZOLAM HCL 2 MG/2ML IJ SOLN
INTRAMUSCULAR | Status: DC | PRN
  Administered 2020-11-29: 1 mg via INTRAVENOUS

## 2020-11-29 MED ORDER — CLOPIDOGREL BISULFATE 300 MG PO TABS
ORAL_TABLET | ORAL | Status: DC | PRN
  Administered 2020-11-29: 300 mg via ORAL

## 2020-11-29 MED ORDER — SODIUM CHLORIDE 0.9% FLUSH: 3.0000 mL | Freq: Two times a day (BID) | INTRAVENOUS | Status: DC

## 2020-11-29 MED ORDER — CLOPIDOGREL BISULFATE 75 MG PO TABS: 75.0000 mg | ORAL_TABLET | Freq: Every day | ORAL | 3 refills | Status: DC

## 2020-11-29 MED ORDER — CLOPIDOGREL BISULFATE 300 MG PO TABS
ORAL_TABLET | ORAL | Status: AC
  Filled 2020-11-29: qty 1

## 2020-11-29 MED ORDER — HEPARIN SODIUM (PORCINE) 1000 UNIT/ML IJ SOLN
INTRAMUSCULAR | Status: AC
  Filled 2020-11-29: qty 1

## 2020-11-29 SURGICAL SUPPLY — 20 items
BALLN VIATRAC 4X20X135 (BALLOONS) ×3
BALLN VIATRAC 7X15X135 (BALLOONS) ×3
BALLOON VIATRAC 4X20X135 (BALLOONS) ×2 IMPLANT
BALLOON VIATRAC 7X15X135 (BALLOONS) ×2 IMPLANT
CATH ANGIO 5F PIGTAIL 65CM (CATHETERS) ×3 IMPLANT
CLOSURE PERCLOSE PROSTYLE (VASCULAR PRODUCTS) ×3 IMPLANT
GUIDE CATH VISTA RDC 6F (CATHETERS) ×3 IMPLANT
KIT ENCORE 26 ADVANTAGE (KITS) ×3 IMPLANT
KIT MICROPUNCTURE NIT STIFF (SHEATH) ×3 IMPLANT
KIT PV (KITS) ×3 IMPLANT
SHEATH PINNACLE 6F 10CM (SHEATH) ×3 IMPLANT
SHEATH PROBE COVER 6X72 (BAG) ×3 IMPLANT
STENT HERCULINK RX 6.0X18X135 (Permanent Stent) ×3 IMPLANT
STOPCOCK MORSE 400PSI 3WAY (MISCELLANEOUS) ×3 IMPLANT
SYR MEDRAD MARK 7 150ML (SYRINGE) ×3 IMPLANT
TRANSDUCER W/STOPCOCK (MISCELLANEOUS) ×3 IMPLANT
TRAY PV CATH (CUSTOM PROCEDURE TRAY) ×3 IMPLANT
TUBING CIL FLEX 10 FLL-RA (TUBING) ×3 IMPLANT
WIRE HITORQ VERSACORE ST 145CM (WIRE) ×3 IMPLANT
WIRE STABILIZER XS .014X180CM (WIRE) ×3 IMPLANT

## 2020-11-29 NOTE — Progress Notes (Signed)
Pt ambulated without difficulty or bleeding.   Discharged home with her husband at 57 who will drive and stay with pt x 24 hrs.

## 2020-11-29 NOTE — Interval H&P Note (Signed)
History and Physical Interval Note:  11/29/2020 11:27 AM  Kristen Suarez  has presented today for surgery, with the diagnosis of renal artery stenosis.  The various methods of treatment have been discussed with the patient and family. After consideration of risks, benefits and other options for treatment, the patient has consented to  Procedure(s): RENAL ANGIOGRAPHY (N/A) as a surgical intervention.  The patient's history has been reviewed, patient examined, no change in status, stable for surgery.  I have reviewed the patient's chart and labs.  Questions were answered to the patient's satisfaction.     Kathlyn Sacramento

## 2020-11-30 ENCOUNTER — Other Ambulatory Visit: Payer: Self-pay | Admitting: *Deleted

## 2020-11-30 ENCOUNTER — Encounter (HOSPITAL_COMMUNITY): Payer: Self-pay | Admitting: Cardiovascular Disease

## 2020-11-30 DIAGNOSIS — I701 Atherosclerosis of renal artery: Secondary | ICD-10-CM

## 2020-11-30 MED FILL — Heparin Sodium (Porcine) Inj 1000 Unit/ML: INTRAMUSCULAR | Qty: 10 | Status: AC

## 2020-12-01 NOTE — Progress Notes (Signed)
Carelink Summary Report / Loop Recorder 

## 2020-12-06 ENCOUNTER — Ambulatory Visit (HOSPITAL_COMMUNITY)
Admission: RE | Admit: 2020-12-06 | Discharge: 2020-12-06 | Disposition: A | Discharge status: Home / Self Care | Payer: Medicare Other | Source: Ambulatory Visit | Attending: Cardiology | Admitting: Cardiology

## 2020-12-06 ENCOUNTER — Other Ambulatory Visit: Payer: Self-pay

## 2020-12-06 ENCOUNTER — Other Ambulatory Visit (HOSPITAL_COMMUNITY): Payer: Self-pay | Admitting: Cardiovascular Disease

## 2020-12-06 DIAGNOSIS — I701 Atherosclerosis of renal artery: Secondary | ICD-10-CM

## 2020-12-06 DIAGNOSIS — Z9889 Other specified postprocedural states: Secondary | ICD-10-CM

## 2020-12-10 NOTE — Assessment & Plan Note (Signed)
Your CT chest is stable overall. This is good news.

## 2020-12-10 NOTE — Assessment & Plan Note (Signed)
We will plan to continue Bevespi 2 puffs once a day. Rinse and gargle after using Keep albuterol available to use 2 puffs or 1 neb treatment up to every 4 hours if needed for shortness of breath, chest tightness, wheezing.  We will get you a new nebulizer machine.

## 2020-12-10 NOTE — Assessment & Plan Note (Signed)
Continue your irbesartan as ordered. Start pantoprazole 40mg  (Protoinix) 40mg  daily if not already on this.  Continue famotidine as you are taking  Continue zyrtec and nasonex as you are using them  Follow with Dr. Lamonte Sakai in 2 months or sooner if you have any problems.

## 2020-12-14 ENCOUNTER — Ambulatory Visit: Payer: Medicare Other | Attending: Internal Medicine

## 2020-12-14 DIAGNOSIS — Z23 Encounter for immunization: Secondary | ICD-10-CM

## 2020-12-15 NOTE — Progress Notes (Signed)
   Covid-19 Vaccination Clinic  Name:  Kristen Suarez    MRN: [id number] DOB: [birth year]  12/15/2020  [name] was observed post Covid-19 immunization for 15 minutes without incident. She was provided with Vaccine Information Sheet and instruction to access the V-Safe system.   [name] was instructed to call 911 with any severe reactions post vaccine: Difficulty breathing  Swelling of face and throat  A fast heartbeat  A bad rash all over body  Dizziness and weakness

## 2020-12-18 ENCOUNTER — Encounter (HOSPITAL_BASED_OUTPATIENT_CLINIC_OR_DEPARTMENT_OTHER): Payer: Self-pay

## 2020-12-18 ENCOUNTER — Telehealth: Payer: Self-pay

## 2020-12-18 DIAGNOSIS — E7800 Pure hypercholesterolemia, unspecified: Secondary | ICD-10-CM

## 2020-12-18 DIAGNOSIS — Z5181 Encounter for therapeutic drug level monitoring: Secondary | ICD-10-CM

## 2020-12-18 NOTE — Telephone Encounter (Signed)
Device has reached RRT.  Spoke with pt, she v/u to disconnect home monitor.  Future checks cancelled.  Carelink and [employer] updated.    Pt indicates she will wait until the next ime she is due to see Dr. Caryl Comes to discuss removal.

## 2020-12-19 ENCOUNTER — Ambulatory Visit (INDEPENDENT_AMBULATORY_CARE_PROVIDER_SITE_OTHER): Payer: Medicare Other | Admitting: Cardiovascular Disease

## 2020-12-19 ENCOUNTER — Other Ambulatory Visit: Payer: Self-pay

## 2020-12-19 ENCOUNTER — Encounter: Payer: Self-pay | Admitting: Cardiovascular Disease

## 2020-12-19 VITALS — BP 123/73 | HR 78 | Ht 63.0 in | Wt 146.6 lb

## 2020-12-19 DIAGNOSIS — E785 Hyperlipidemia, unspecified: Secondary | ICD-10-CM

## 2020-12-19 DIAGNOSIS — I701 Atherosclerosis of renal artery: Secondary | ICD-10-CM

## 2020-12-19 LAB — BASIC METABOLIC PANEL
BUN/Creatinine Ratio: 22 (ref 12–28)
BUN: 28 mg/dL — ABNORMAL HIGH (ref 8–27)
CO2: 23 mmol/L (ref 20–29)
Calcium: 9.8 mg/dL (ref 8.7–10.3)
Chloride: 100 mmol/L (ref 96–106)
Creatinine, Ser: 1.28 mg/dL — ABNORMAL HIGH (ref 0.57–1.00)
Glucose: 88 mg/dL (ref 70–99)
Potassium: 4.5 mmol/L (ref 3.5–5.2)
Sodium: 138 mmol/L (ref 134–144)
eGFR: 44 mL/min/{1.73_m2} — ABNORMAL LOW (ref >59)

## 2020-12-19 NOTE — Progress Notes (Signed)
Cardiology Office Note   Date:  12/19/2020   ID:  Kristen Suarez, DOB [birth year], MRN [id number]  PCP:  Flossie Buffy, NP  Cardiologist: Dr. Oval Linsey  No chief complaint on file.      History of Present Illness: Kristen Suarez is a 75 y.o. female who is here today for follow-up visit regarding renovascular hypertension.   She has known history of hypertension, coronary calcifications noted on previous CT scan, pulmonary sarcoidosis and asthma.  She was diagnosed with hypertension 20 years ago but had significant elevation in blood pressure over the last 6 months.      She underwent renal artery duplex in June which showed significant right renal artery stenosis with peak velocity of 291 with a bruit.  No flow was detected into the left renal artery.  Right kidney size was 10.6 cm and left kidney was 8 cm.  Her blood pressure continues to be uncontrolled in spite of 4 antihypertensive medications and she started having worsening renal function.  Thus, I proceeded with angiography on September 14 which showed chronically occluded left renal artery with severe ostial stenosis in the right renal artery.  I performed successful angioplasty and stent placement to the right renal artery.  She developed hypotension and presyncope few days after the procedure with blood pressure of 80/50.  She contacted [place] and I instructed her to stop hydrochlorothiazide and spironolactone.  She continue to monitor blood pressure at home and her readings have been excellent.  She is currently on amlodipine and irbesartan.  She is feeling significantly better and started exercising.  Past Medical History:  Diagnosis Date   Arthritis    Asthma    CAD in native artery 08/15/2020   Chickenpox    Colon polyps 2010   Heart murmur    History of fainting spells of unknown cause    Hyperlipidemia    Hypertension    Sarcoid 06/16/1976    Past Surgical History:  Procedure Laterality Date    ABDOMINAL HYSTERECTOMY  1987   BASAL CELL CARCINOMA EXCISION  06/30/2020   nose   LUNG BIOPSY  06/16/1976   PERIPHERAL VASCULAR INTERVENTION Right 11/29/2020   Procedure: PERIPHERAL VASCULAR INTERVENTION;  Surgeon: Wellington Hampshire, MD;  Location: [facility];  Service: Cardiovascular;  Laterality: Right;  Renal Artery   RENAL ANGIOGRAPHY N/A 11/29/2020   Procedure: RENAL ANGIOGRAPHY;  Surgeon: Wellington Hampshire, MD;  Location: [place] [facility];  Service: Cardiovascular;  Laterality: N/A;     Current Outpatient Medications  Medication Sig Dispense Refill   acetaminophen (TYLENOL) 500 MG tablet Take 500 mg by mouth every 6 (six) hours as needed for moderate pain.     albuterol (PROVENTIL) (2.5 MG/3ML) 0.083% nebulizer solution USE 1 VIAL VIA NEBULIZER EVERY 6 HOURS (Patient taking differently: Take 2.5 mg by nebulization See admin instructions. Inhale the contents of 1 vial in the morning, may use a second vial as needed for shortness of breath) 150 mL 5   albuterol (VENTOLIN HFA) 108 (90 Base) MCG/ACT inhaler Inhale 2 puffs into the lungs every 6 (six) hours as needed for wheezing or shortness of breath. 8 g 6   amLODipine (NORVASC) 10 MG tablet Take 1 tablet (10 mg total) by mouth daily. 90 tablet 3   aspirin EC 81 MG tablet Take 1 tablet (81 mg total) by mouth daily. Swallow whole. 150 tablet 2   Budeson-Glycopyrrol-Formoterol (BREZTRI AEROSPHERE) 160-9-4.8 MCG/ACT AERO Inhale 2 puffs into the  lungs in the morning and at bedtime. 32.1 g 2   Calcium Carb-Cholecalciferol (CALCIUM 600 + D PO) Take 1 tablet by mouth daily.     cetirizine (ZYRTEC) 10 MG tablet Take 10 mg by mouth daily as needed for allergies.     clopidogrel (PLAVIX) 75 MG tablet Take 1 tablet (75 mg total) by mouth daily. 30 tablet 3   docusate sodium (COLACE) 100 MG capsule Take 100 mg by mouth daily.      famotidine (PEPCID) 10 MG tablet Take 10 mg by mouth daily as needed for heartburn.     irbesartan (AVAPRO) 300  MG tablet Take 1 tablet (300 mg total) by mouth daily. 90 tablet 3   mometasone (NASONEX) 50 MCG/ACT nasal spray Place 2 sprays into the nose daily. 1 each 3   Multiple Vitamins-Minerals (MULTIVITAL PO) Take 1 tablet by mouth daily.      pantoprazole (PROTONIX) 40 MG tablet TAKE 1 TABLET(40 MG) BY MOUTH DAILY 30 tablet 5   PREVIDENT 5000 BOOSTER PLUS 1.1 % PSTE Place 1 application onto teeth 3 (three) times a week.     Probiotic Product (PROBIOTIC DAILY PO) Take 1 tablet by mouth daily.      sodium chloride (OCEAN) 0.65 % SOLN nasal spray Place 1 spray into both nostrils as needed for congestion.     rosuvastatin (CRESTOR) 20 MG tablet Take 1 tablet (20 mg total) by mouth daily. 90 tablet 3   No current facility-administered medications for this visit.    Allergies:   Penicillins, Sulfa antibiotics, and Keflex [cephalexin]    Social History:  The patient  reports that she has never smoked. She has never used smokeless tobacco. She reports current alcohol use. She reports that she does not use drugs.   Family History:  The patient's family history includes Alcohol abuse in her brother, brother, father, and sister; Arthritis in her mother; Cancer in her mother; Depression in her mother; Early death in her brother; Heart attack in her brother and mother; Heart disease in her brother, brother, father, maternal grandfather, paternal grandfather, and sister; Hyperlipidemia in her brother, brother, father, mother, sister, sister, and son; Hypertension in her brother, father, mother, and sister; Multiple sclerosis in her daughter; Stroke in her sister.    ROS:  Please see the history of present illness.   Otherwise, review of systems are positive for none.   All other systems are reviewed and negative.    PHYSICAL EXAM: VS:  BP 123/73   Pulse 78   Ht 5\' 3"  (1.6 m)   Wt 146 lb 9.6 oz (66.5 kg)   SpO2 96%   BMI 25.97 kg/m  , BMI Body mass index is 25.97 kg/m. GEN: Well nourished, well  developed, in no acute distress  HEENT: normal  Neck: no JVD, carotid bruits, or masses Cardiac: RRR; no murmurs, rubs, or gallops,no edema  Respiratory:  clear to auscultation bilaterally, normal work of breathing GI: soft, nontender, nondistended, + BS MS: no deformity or atrophy  Skin: warm and dry, no rash Neuro:  Strength and sensation are intact Psych: euthymic mood, full affect Vascular: Right femoral pulses normal with no hematoma.  No bruit.   EKG:  EKG is not ordered today.    Recent Labs: 02/29/2020: TSH 2.32 11/21/2020: ALT 21; BUN 31; Creatinine, Ser 1.37; Hemoglobin 13.5; Platelets 245; Potassium 5.0; Sodium 137    Lipid Panel    Component Value Date/Time   CHOL 209 (H) 11/21/2020 4827  TRIG 175 (H) 11/21/2020 0918   HDL 50 11/21/2020 0918   CHOLHDL 4.2 11/21/2020 0918   CHOLHDL 3 07/28/2020 0840   VLDL 23.8 07/28/2020 0840   LDLCALC 128 (H) 11/21/2020 0918      Wt Readings from Last 3 Encounters:  12/19/20 146 lb 9.6 oz (66.5 kg)  11/29/20 141 lb (64 kg)  11/21/20 144 lb (65.3 kg)      No flowsheet data found.    ASSESSMENT AND PLAN:  1.  Renal artery stenosis: Status post recent stenting of the right renal artery with chronically occluded left renal artery.  Significant improvement in blood pressure control since then.  Continue amlodipine and irbesartan.  I discontinued potassium chloride.  Check basic metabolic profile today.  Continue clopidogrel for 3 months and then stop.  Continue aspirin indefinitely.  Repeat renal artery duplex in 6 months.   2.  Hyperlipidemia: The patient is tolerating current dose of rosuvastatin 20 mg daily.  Most recent lipid profile showed an LDL of 128.  I suspect that she will require additional antihyperlipidemia medications.    Disposition: Follow-up in 6 months.  Signed,  Kathlyn Sacramento, MD  12/19/2020 9:26 AM    [facility]

## 2020-12-19 NOTE — Patient Instructions (Signed)
Medication Instructions:  STOP the Potassium STOP the Plavix after 3 months  *If you need a refill on your cardiac medications before your next appointment, please call your pharmacy*   Lab Work: Your provider would like for you to have the following labs today: BMET  If you have labs (blood work) drawn today and your tests are completely normal, you will receive your results only by: [place] (if you have MyChart) OR A paper copy in the mail If you have any lab test that is abnormal or we need to change your treatment, we will call you to review the results.   Testing/Procedures: None ordered   Follow-Up: At [facility], you and your health needs are our priority.  As part of our continuing mission to provide you with exceptional heart care, we have created designated Provider Care Teams.  These Care Teams include your primary Cardiologist (physician) and Advanced Practice Providers (APPs -  Physician Assistants and Nurse Practitioners) who all work together to provide you with the care you need, when you need it.  We recommend signing up for the patient portal called "MyChart".  Sign up information is provided on this After Visit Summary.  MyChart is used to connect with patients for Virtual Visits (Telemedicine).  Patients are able to view lab/test results, encounter notes, upcoming appointments, etc.  Non-urgent messages can be sent to your provider as well.   To learn more about what you can do with MyChart, go to [email or web address].    Your next appointment:   6 month(s)  The format for your next appointment:   In Person  Provider:   Kathlyn Sacramento, MD

## 2020-12-22 ENCOUNTER — Other Ambulatory Visit (HOSPITAL_BASED_OUTPATIENT_CLINIC_OR_DEPARTMENT_OTHER): Payer: Self-pay

## 2020-12-22 MED ORDER — INFLUENZA VAC A&B SA ADJ QUAD 0.5 ML IM PRSY
PREFILLED_SYRINGE | INTRAMUSCULAR | 0 refills | Status: DC
  Filled 2020-12-22: qty 0.5, 1d supply, fill #0

## 2020-12-25 MED ORDER — ROSUVASTATIN CALCIUM 40 MG PO TABS: 40.0000 mg | ORAL_TABLET | Freq: Every day | ORAL | 3 refills | Status: DC

## 2020-12-26 ENCOUNTER — Other Ambulatory Visit (HOSPITAL_BASED_OUTPATIENT_CLINIC_OR_DEPARTMENT_OTHER): Payer: Self-pay

## 2020-12-26 MED ORDER — COVID-19MRNA BIVAL VACC PFIZER 30 MCG/0.3ML IM SUSP
INTRAMUSCULAR | 0 refills | Status: DC
  Filled 2020-12-26: qty 0.3, 1d supply, fill #0

## 2020-12-29 ENCOUNTER — Ambulatory Visit
Admission: RE | Admit: 2020-12-29 | Discharge: 2020-12-29 | Disposition: A | Discharge status: Home / Self Care | Payer: Medicare Other | Source: Ambulatory Visit | Attending: Emergency Medicine | Admitting: Emergency Medicine

## 2020-12-29 DIAGNOSIS — D869 Sarcoidosis, unspecified: Secondary | ICD-10-CM

## 2021-01-03 ENCOUNTER — Encounter (HOSPITAL_BASED_OUTPATIENT_CLINIC_OR_DEPARTMENT_OTHER): Payer: Self-pay

## 2021-01-03 ENCOUNTER — Other Ambulatory Visit: Payer: Self-pay

## 2021-01-03 ENCOUNTER — Emergency Department (HOSPITAL_BASED_OUTPATIENT_CLINIC_OR_DEPARTMENT_OTHER)
Admission: EM | Admit: 2021-01-03 | Discharge: 2021-01-03 | Disposition: A | Discharge status: Home / Self Care | Payer: Medicare Other | Attending: Emergency Medicine | Admitting: Emergency Medicine

## 2021-01-03 ENCOUNTER — Encounter (HOSPITAL_BASED_OUTPATIENT_CLINIC_OR_DEPARTMENT_OTHER): Payer: Self-pay | Admitting: Emergency Medicine

## 2021-01-03 DIAGNOSIS — J449 Chronic obstructive pulmonary disease, unspecified: Secondary | ICD-10-CM | POA: Insufficient documentation

## 2021-01-03 DIAGNOSIS — I10 Essential (primary) hypertension: Secondary | ICD-10-CM | POA: Insufficient documentation

## 2021-01-03 DIAGNOSIS — R55 Syncope and collapse: Secondary | ICD-10-CM

## 2021-01-03 DIAGNOSIS — J45909 Unspecified asthma, uncomplicated: Secondary | ICD-10-CM | POA: Insufficient documentation

## 2021-01-03 DIAGNOSIS — Z85828 Personal history of other malignant neoplasm of skin: Secondary | ICD-10-CM | POA: Insufficient documentation

## 2021-01-03 DIAGNOSIS — I2510 Atherosclerotic heart disease of native coronary artery without angina pectoris: Secondary | ICD-10-CM | POA: Insufficient documentation

## 2021-01-03 DIAGNOSIS — Z79899 Other long term (current) drug therapy: Secondary | ICD-10-CM | POA: Insufficient documentation

## 2021-01-03 DIAGNOSIS — Z7982 Long term (current) use of aspirin: Secondary | ICD-10-CM | POA: Insufficient documentation

## 2021-01-03 DIAGNOSIS — Z7902 Long term (current) use of antithrombotics/antiplatelets: Secondary | ICD-10-CM | POA: Diagnosis not present

## 2021-01-03 LAB — CBC
HCT: 38.1 % (ref 36.0–46.0)
Hemoglobin: 12.8 g/dL (ref 12.0–15.0)
MCH: 31.1 pg (ref 26.0–34.0)
MCHC: 33.6 g/dL (ref 30.0–36.0)
MCV: 92.5 fL (ref 80.0–100.0)
Platelets: 227 10*3/uL (ref 150–400)
RBC: 4.12 MIL/uL (ref 3.87–5.11)
RDW: 13.4 % (ref 11.5–15.5)
WBC: 8.2 10*3/uL (ref 4.0–10.5)
nRBC: 0 % (ref 0.0–0.2)

## 2021-01-03 LAB — URINALYSIS, ROUTINE W REFLEX MICROSCOPIC
Bilirubin Urine: NEGATIVE
Glucose, UA: NEGATIVE mg/dL
Hgb urine dipstick: NEGATIVE
Ketones, ur: NEGATIVE mg/dL
Nitrite: NEGATIVE
Protein, ur: NEGATIVE mg/dL
Specific Gravity, Urine: 1.01 (ref 1.005–1.030)
pH: 7 (ref 5.0–8.0)

## 2021-01-03 LAB — BASIC METABOLIC PANEL
Anion gap: 9 (ref 5–15)
BUN: 24 mg/dL — ABNORMAL HIGH (ref 8–23)
CO2: 27 mmol/L (ref 22–32)
Calcium: 9.5 mg/dL (ref 8.9–10.3)
Chloride: 100 mmol/L (ref 98–111)
Creatinine, Ser: 1.14 mg/dL — ABNORMAL HIGH (ref 0.44–1.00)
GFR, Estimated: 50 mL/min — ABNORMAL LOW (ref >60)
Glucose, Bld: 118 mg/dL — ABNORMAL HIGH (ref 70–99)
Potassium: 3.6 mmol/L (ref 3.5–5.1)
Sodium: 136 mmol/L (ref 135–145)

## 2021-01-03 LAB — URINALYSIS, MICROSCOPIC (REFLEX): RBC / HPF: NONE SEEN RBC/hpf (ref 0–5)

## 2021-01-03 LAB — CBG MONITORING, ED: Glucose-Capillary: 101 mg/dL — ABNORMAL HIGH (ref 70–99)

## 2021-01-03 MED ORDER — NITROFURANTOIN MONOHYD MACRO 100 MG PO CAPS: 100.0000 mg | ORAL_CAPSULE | Freq: Two times a day (BID) | ORAL | 0 refills | Status: DC

## 2021-01-03 NOTE — ED Triage Notes (Signed)
Pt was upstairs in the fitness center doing a chair yoga class when she suddenly became dizzy. She states she eased her way off the chair and was told she collapsed to the floor. Pt c/o feeling tired and nauseated right now. Denies chest pain.

## 2021-01-03 NOTE — ED Notes (Signed)
Pt states that she is no longer dizzy at this time. Pt denis pain.

## 2021-01-03 NOTE — ED Notes (Signed)
RT Note: Pt. placed in room-DB015 with v/s updated, made aware of call bell.

## 2021-01-03 NOTE — ED Provider Notes (Signed)
[facility] DEPT Provider Note   CSN: [id number] Arrival date & time: 01/03/21  7829     History Chief Complaint  Patient presents with   Loss of Consciousness    Kristen Suarez is a 75 y.o. female.   Loss of Consciousness Associated symptoms: dizziness   Associated symptoms: no chest pain, no confusion, no fever, no headaches, no nausea, no palpitations, no seizures, no shortness of breath, no vomiting and no weakness   Patient presents for syncopal episode.  She was [facility] in the garbage facility doing a chair yoga class.  She had a prodrome of dizziness.  She was able to ease her way off of the chair.  She had a witnessed syncopal episode.  Following this episode, she felt fatigued and nauseous.  She denies any chest discomfort.  Last month, she underwent a stenting of right renal artery.  A few days later, she had presyncopal symptoms and hypotension.  She has been instructed to discontinue HCTZ and spironolactone.  She has been monitoring blood pressure at home which has been normal.  She is currently on amlodipine and irbesartan.  Patient reports a long history of vasovagal syncope.  She has undergone repeat evaluations for this.  She does currently see a cardiologist.  She reports that today's episode was consistent with prior episodes of syncope.  She denies any recent infectious symptoms.  She has been eating and drinking normally, although she typically does not eat prior to her exercise/yoga sessions.  She denies any significant exertion with the chair yoga that she was doing.  She states that it is very low intensity.  She reports that witnesses on the scene and reported that she was only out for a few seconds.  She denies any areas of pain or suspected injuries from this episode.     Past Medical History:  Diagnosis Date   Arthritis    Asthma    CAD in native artery 08/15/2020   Chickenpox    Colon polyps 2010   Heart murmur    History of  fainting spells of unknown cause    Hyperlipidemia    Hypertension    Sarcoid 06/16/1976    Patient Active Problem List   Diagnosis Date Noted   CAD in native artery 08/15/2020   Basal cell carcinoma 06/30/2020   History of loop recorder 12/13/2019   Allergic rhinitis 12/08/2019   Cough 11/08/2019   Post-nasal drip 11/08/2019   Multiple joint pain 04/22/2019   Bilateral leg edema 04/22/2019   Vaginal atrophy 01/05/2019   Resting tremor 09/08/2018   Sarcoidosis 05/08/2018   HTN (hypertension), benign 05/08/2018   Colon polyp 05/08/2018   Family hx of colon cancer 05/08/2018   Cystocele, midline 05/08/2018   Hyperlipidemia 05/08/2018   Vasovagal syncope 05/08/2018   Seborrheic keratosis 05/08/2018   Pseudophakia of both eyes 05/08/2018   GERD (gastroesophageal reflux disease) 05/08/2018   COPD (chronic obstructive pulmonary disease) ([place]) 05/08/2018    Past Surgical History:  Procedure Laterality Date   ABDOMINAL HYSTERECTOMY  1987   BASAL CELL CARCINOMA EXCISION  06/30/2020   nose   LUNG BIOPSY  06/16/1976   PERIPHERAL VASCULAR INTERVENTION Right 11/29/2020   Procedure: PERIPHERAL VASCULAR INTERVENTION;  Surgeon: Wellington Hampshire, MD;  Location: [facility];  Service: Cardiovascular;  Laterality: Right;  Renal Artery   RENAL ANGIOGRAPHY N/A 11/29/2020   Procedure: RENAL ANGIOGRAPHY;  Surgeon: Wellington Hampshire, MD;  Location: [facility];  Service: Cardiovascular;  Laterality: N/A;     OB History     Gravida  3   Para  3   Term  3   Preterm      AB      Living  3      SAB      IAB      Ectopic      Multiple      Live Births              Family History  Problem Relation Age of Onset   Arthritis Mother    Cancer Mother    Depression Mother    Heart attack Mother    Hyperlipidemia Mother    Hypertension Mother    Alcohol abuse Father    Heart disease Father    Hypertension Father    Hyperlipidemia Father    Heart disease  Sister    Stroke Sister    Alcohol abuse Brother    Heart disease Brother    Hypertension Brother    Hyperlipidemia Brother    Multiple sclerosis Daughter    Hyperlipidemia Son    Heart disease Maternal Grandfather    Heart disease Paternal Grandfather    Hyperlipidemia Sister    Alcohol abuse Sister    Hyperlipidemia Sister    Hypertension Sister    Alcohol abuse Brother    Early death Brother    Heart attack Brother    Heart disease Brother    Hyperlipidemia Brother     Social History   Tobacco Use   Smoking status: Never   Smokeless tobacco: Never  Vaping Use   Vaping Use: Never used  Substance Use Topics   Alcohol use: Yes    Comment: once in a while   Drug use: Never    Home Medications Prior to Admission medications   Medication Sig Start Date End Date Taking? Authorizing Provider  nitrofurantoin, macrocrystal-monohydrate, (MACROBID) 100 MG capsule Take 1 capsule (100 mg total) by mouth 2 (two) times daily. 01/03/21  Yes Godfrey Pick, MD  acetaminophen (TYLENOL) 500 MG tablet Take 500 mg by mouth every 6 (six) hours as needed for moderate pain.    [provider]  albuterol (PROVENTIL) (2.5 MG/3ML) 0.083% nebulizer solution USE 1 VIAL VIA NEBULIZER EVERY 6 HOURS Patient taking differently: Take 2.5 mg by nebulization See admin instructions. Inhale the contents of 1 vial in the morning, may use a second vial as needed for shortness of breath 03/21/20   Collene Gobble, MD  albuterol (VENTOLIN HFA) 108 (90 Base) MCG/ACT inhaler Inhale 2 puffs into the lungs every 6 (six) hours as needed for wheezing or shortness of breath. 01/03/20   Collene Gobble, MD  amLODipine (NORVASC) 10 MG tablet Take 1 tablet (10 mg total) by mouth daily. 03/30/20   Nche, [name], NP  aspirin EC 81 MG tablet Take 1 tablet (81 mg total) by mouth daily. Swallow whole. 11/29/20 11/29/21  Wellington Hampshire, MD  Budeson-Glycopyrrol-Formoterol (BREZTRI AEROSPHERE) 160-9-4.8 MCG/ACT AERO  Inhale 2 puffs into the lungs in the morning and at bedtime. 03/29/20   Collene Gobble, MD  Calcium Carb-Cholecalciferol (CALCIUM 600 + D PO) Take 1 tablet by mouth daily.    [provider]  cetirizine (ZYRTEC) 10 MG tablet Take 10 mg by mouth daily as needed for allergies.    [provider]  clopidogrel (PLAVIX) 75 MG tablet Take 1 tablet (75 mg total) by mouth daily. 11/29/20 11/29/21  Kathlyn Sacramento  A, MD  COVID-19 mRNA bivalent vaccine, Pfizer, injection Inject into the muscle. 12/14/20   Carlyle Basques, MD  docusate sodium (COLACE) 100 MG capsule Take 100 mg by mouth daily.     [provider]  famotidine (PEPCID) 10 MG tablet Take 10 mg by mouth daily as needed for heartburn.    [provider]  influenza vaccine adjuvanted (FLUAD) 0.5 ML injection Inject into the muscle. 12/22/20     irbesartan (AVAPRO) 300 MG tablet Take 1 tablet (300 mg total) by mouth daily. 02/29/20   Nche, [name], NP  mometasone (NASONEX) 50 MCG/ACT nasal spray Place 2 sprays into the nose daily. 06/07/20   Nche, [name], NP  Multiple Vitamins-Minerals (MULTIVITAL PO) Take 1 tablet by mouth daily.     [provider]  pantoprazole (PROTONIX) 40 MG tablet TAKE 1 TABLET(40 MG) BY MOUTH DAILY 10/05/20   Collene Gobble, MD  PREVIDENT 5000 BOOSTER PLUS 1.1 % PSTE Place 1 application onto teeth 3 (three) times a week. 03/15/20   [provider]  Probiotic Product (PROBIOTIC DAILY PO) Take 1 tablet by mouth daily.     [provider]  rosuvastatin (CRESTOR) 40 MG tablet Take 1 tablet (40 mg total) by mouth daily. 12/25/20 03/25/21  Skeet Latch, MD  sodium chloride (OCEAN) 0.65 % SOLN nasal spray Place 1 spray into both nostrils as needed for congestion.    [provider]    Allergies    Penicillins, Sulfa antibiotics, and Keflex [cephalexin]  Review of Systems   Review of Systems  Constitutional:  Positive for fatigue. Negative for  activity change, appetite change, chills and fever.  HENT:  Negative for ear pain and sore throat.   Eyes:  Negative for pain and visual disturbance.  Respiratory:  Negative for cough, chest tightness, shortness of breath and wheezing.   Cardiovascular:  Positive for syncope. Negative for chest pain and palpitations.  Gastrointestinal:  Negative for abdominal pain, diarrhea, nausea and vomiting.  Genitourinary:  Negative for dysuria, flank pain and hematuria.  Musculoskeletal:  Negative for arthralgias, back pain, myalgias and neck pain.  Skin:  Negative for color change and rash.  Neurological:  Positive for dizziness and syncope. Negative for seizures, facial asymmetry, speech difficulty, weakness, numbness and headaches.  Psychiatric/Behavioral:  Negative for confusion and decreased concentration.   All other systems reviewed and are negative.  Physical Exam Updated Vital Signs BP (!) 148/74   Pulse 69   Temp 98 F (36.7 C) (Oral)   Resp 20   Ht 5\' 3"  (1.6 m)   Wt 64 kg   SpO2 100%   BMI 24.98 kg/m   Physical Exam Vitals and nursing note reviewed.  Constitutional:      General: She is not in acute distress.    Appearance: Normal appearance. She is well-developed and normal weight. She is not ill-appearing, toxic-appearing or diaphoretic.  HENT:     Head: Normocephalic and atraumatic.     Right Ear: External ear normal.     Left Ear: External ear normal.     Nose: Nose normal.     Mouth/Throat:     Mouth: Mucous membranes are moist.  Eyes:     General: No visual field deficit.    Extraocular Movements: Extraocular movements intact.     Conjunctiva/sclera: Conjunctivae normal.  Cardiovascular:     Rate and Rhythm: Normal rate and regular rhythm.     Heart sounds: No murmur heard. Pulmonary:  Effort: Pulmonary effort is normal. No respiratory distress.     Breath sounds: Normal breath sounds. No wheezing or rales.  Abdominal:     Palpations: Abdomen is soft.      Tenderness: There is no abdominal tenderness. There is no right CVA tenderness or left CVA tenderness.  Musculoskeletal:        General: No swelling. Normal range of motion.     Cervical back: Normal range of motion and neck supple.     Right lower leg: No edema.     Left lower leg: No edema.  Skin:    General: Skin is warm and dry.     Coloration: Skin is not jaundiced or pale.  Neurological:     General: No focal deficit present.     Mental Status: She is alert and oriented to person, place, and time.     Cranial Nerves: Cranial nerves 2-12 are intact. No cranial nerve deficit, dysarthria or facial asymmetry.     Sensory: Sensation is intact. No sensory deficit.     Motor: Motor function is intact. No weakness, abnormal muscle tone or pronator drift.     Coordination: Coordination is intact. Finger-Nose-Finger Test normal.  Psychiatric:        Mood and Affect: Mood normal.        Behavior: Behavior normal.        Thought Content: Thought content normal.        Judgment: Judgment normal.    ED Results / Procedures / Treatments   Labs (all labs ordered are listed, but only abnormal results are displayed) Labs Reviewed  BASIC METABOLIC PANEL - Abnormal; Notable for the following components:      Result Value   Glucose, Bld 118 (*)    BUN 24 (*)    Creatinine, Ser 1.14 (*)    GFR, Estimated 50 (*)    All other components within normal limits  URINALYSIS, ROUTINE W REFLEX MICROSCOPIC - Abnormal; Notable for the following components:   Color, Urine STRAW (*)    Leukocytes,Ua LARGE (*)    All other components within normal limits  URINALYSIS, MICROSCOPIC (REFLEX) - Abnormal; Notable for the following components:   Bacteria, UA FEW (*)    All other components within normal limits  CBG MONITORING, ED - Abnormal; Notable for the following components:   Glucose-Capillary 101 (*)    All other components within normal limits  CBC    EKG EKG Interpretation  Date/Time:  Wednesday  January 03 2021 10:33:08 EDT Ventricular Rate:  76 PR Interval:  164 QRS Duration: 66 QT Interval:  404 QTC Calculation: 454 R Axis:   57 Text Interpretation: Normal sinus rhythm Normal ECG Confirmed by Godfrey Pick ([id number]) on 01/03/2021 12:03:58 PM  Radiology No results found.  Procedures Procedures   Medications Ordered in ED Medications - No data to display  ED Course  I have reviewed the triage vital signs and the nursing notes.  Pertinent labs & imaging results that were available during my care of the patient were reviewed by me and considered in my medical decision making (see chart for details).    MDM Rules/Calculators/A&P                          Patient is a 75 year old female with history of multiple episodes of vasovagal syncope, presenting for a syncopal episode.  This occurred during a chair yoga session, just prior to arrival.  She had  a prodrome of dizziness.  Witnesses state that the syncopal episode lasted for only few seconds.  After the syncopal episode, she has felt fatigued.  She states that all of the symptoms are consistent with prior episodes of syncope.  Vital signs normal upon arrival, although she states that her current blood pressure is higher than it typically is.  She currently denies any symptoms.  She has no focal neurologic deficits on exam.  Lab work was obtained.  Patient's electrolytes were normal.  EKG showed normal sinus rhythm with narrow QRS and no interval prolongation.  Patient's electrolytes are normal.  Creatinine is improved from baseline.  CBC showed no anemia and no leukocytosis.  Urinalysis shows bacteria and pyuria.  Patient was given Gatorade to drink in the ED.  Urinalysis results were discussed with the patient.  She does have history of UTIs and has had urine infections requiring hospitalization in the past.  She was agreeable for empiric treatment with antibiotics.  Macrobid was prescribed.  Return precautions given.  Patient was  discharged in stable condition.  Final Clinical Impression(s) / ED Diagnoses Final diagnoses:  Syncope and collapse    Rx / DC Orders ED Discharge Orders          Ordered    nitrofurantoin, macrocrystal-monohydrate, (MACROBID) 100 MG capsule  2 times daily        01/03/21 1225             Godfrey Pick, MD 01/04/21 [phone]

## 2021-01-06 ENCOUNTER — Encounter (HOSPITAL_BASED_OUTPATIENT_CLINIC_OR_DEPARTMENT_OTHER): Payer: Self-pay

## 2021-01-09 ENCOUNTER — Encounter (HOSPITAL_BASED_OUTPATIENT_CLINIC_OR_DEPARTMENT_OTHER): Payer: Self-pay

## 2021-01-10 DIAGNOSIS — I10 Essential (primary) hypertension: Secondary | ICD-10-CM

## 2021-01-10 MED ORDER — IRBESARTAN 150 MG PO TABS: 150.0000 mg | ORAL_TABLET | Freq: Every day | ORAL | 4 refills | Status: DC

## 2021-01-10 MED ORDER — AMLODIPINE BESYLATE 5 MG PO TABS: 5.0000 mg | ORAL_TABLET | Freq: Every day | ORAL | 4 refills | Status: DC

## 2021-01-16 ENCOUNTER — Ambulatory Visit (INDEPENDENT_AMBULATORY_CARE_PROVIDER_SITE_OTHER): Payer: Medicare Other | Admitting: Emergency Medicine

## 2021-01-16 ENCOUNTER — Encounter: Payer: Self-pay | Admitting: Emergency Medicine

## 2021-01-16 ENCOUNTER — Other Ambulatory Visit: Payer: Self-pay

## 2021-01-16 DIAGNOSIS — J42 Unspecified chronic bronchitis: Secondary | ICD-10-CM | POA: Diagnosis not present

## 2021-01-16 DIAGNOSIS — D869 Sarcoidosis, unspecified: Secondary | ICD-10-CM

## 2021-01-16 DIAGNOSIS — R053 Chronic cough: Secondary | ICD-10-CM

## 2021-01-16 DIAGNOSIS — I701 Atherosclerosis of renal artery: Secondary | ICD-10-CM | POA: Diagnosis not present

## 2021-01-16 MED ORDER — PREDNISONE 10 MG PO TABS: ORAL_TABLET | ORAL | 0 refills | Status: DC

## 2021-01-16 NOTE — Assessment & Plan Note (Signed)
Flaring cough and SOB, wheeze. Suspect this is due to allergy season and worsening PND. P[lan to treat with pred taper  Please take prednisone as directed until completely gone. Continue Breztri 2 puffs twice a day.  Rinse and gargle after using. Continue your albuterol 1 nebulizer treatment or 2 puffs up to every 4 hours if needed for shortness of breath, chest tightness, wheezing. Follow with Dr Lamonte Sakai in 6 months or sooner if you have any problems

## 2021-01-16 NOTE — Patient Instructions (Addendum)
We reviewed the CT scan of the chest today shows stable scarring. Please take prednisone as directed until completely gone. Continue Breztri 2 puffs twice a day.  Rinse and gargle after using. Continue your albuterol 1 nebulizer treatment or 2 puffs up to every 4 hours if needed for shortness of breath, chest tightness, wheezing. Continue your Nasonex nasal spray Continue Zyrtec Continue your Protonix once daily, Pepcid as needed. Follow with Dr Lamonte Sakai in 6 months or sooner if you have any problems

## 2021-01-16 NOTE — Assessment & Plan Note (Signed)
Contribution of GERD and rhinitis  Continue your Nasonex nasal spray Continue Zyrtec Continue your Protonix once daily, Pepcid as needed.

## 2021-01-16 NOTE — Progress Notes (Signed)
Subjective:    Patient ID: Kristen Suarez, female    DOB: [birth year], 75 y.o.   MRN: [id number]  HPI  ROV 07/28/20 --75 year old woman with history of sarcoidosis and associated severe obstructive lung disease, also component of restrictive lung disease.  She has chronic cough in the setting of this as well as allergic rhinitis and GERD. She is doing cardiopulmonary rehab.  Did not desaturate at our last office visit I continued her on Breztri at her last visit.  She has albuterol, uses 1 neb every morning, uses her HFA rarely Having a lot of eye and nasal irritation from allergies. Rare wheeze, minimal cough.  She had mild COVID in January. She is Vaccinated. She has occasionally had chills since then.  She is on nasonex, zyrtec, PPI + pepcid Due for repeat CT chest in October.   ROV 01/16/21 --follow-up visit 75 year old woman with a history of sarcoidosis and associated severe obstructive lung disease, superimposed restrictive lung disease.  She has chronic cough due to her obstructive disease, GERD, chronic allergic rhinitis.  Currently managed on Breztri.  She uses albuterol approximately 3x a day, more than her usual She is on Nasonex, Zyrtec.  Protonix 40 mg daily, Pepcid as needed Reports that she has had some increased dry cough over the last week, no infectious prodrome or sx. Seems to be associated with increased allergies and congestion. She has used some hydrocodone syrup, cough drops. She has had some hypotension and has had syncope x2 as her BP meds have been adjusted.   CT scan of the chest performed 12/29/2020 reviewed by me, shows unchanged mediastinal, hilar adenopathy, unchanged extensive mid lobe predominant traction bronchiectasis and architectural distortion.  There is a new small pericardial effusion.  Emphysematous change.    Review of Systems  Constitutional:  Negative for fever and unexpected weight change.  HENT:  Negative for congestion, dental problem, ear  pain, nosebleeds, postnasal drip, rhinorrhea, sinus pressure, sneezing, sore throat and trouble swallowing.   Eyes:  Negative for redness and itching.  Respiratory:  Positive for cough and shortness of breath. Negative for chest tightness and wheezing.   Cardiovascular:  Negative for palpitations and leg swelling.  Gastrointestinal:  Negative for nausea and vomiting.  Genitourinary:  Negative for dysuria.  Musculoskeletal:  Negative for joint swelling.  Skin:  Negative for rash.  Neurological:  Negative for headaches.  Hematological:  Does not bruise/bleed easily.  Psychiatric/Behavioral:  Negative for dysphoric mood. The patient is not nervous/anxious.       Objective:   Physical Exam Vitals:   01/16/21 1544  BP: (!) 144/78  Pulse: 83  Temp: 98.3 F (36.8 C)  TempSrc: Oral  SpO2: 97%  Weight: 145 lb 3.2 oz (65.9 kg)  Height: 5\' 3"  (1.6 m)   Gen: Pleasant, well-nourished, in no distress,  normal affect  ENT: No lesions,  mouth clear,  oropharynx clear, no postnasal drip  Neck: No JVD, no upper airway noise  Lungs: No use of accessory muscles, good excursion, B insp crackles, end exp wheezes.   Cardiovascular: RRR, heart sounds normal, no murmur or gallops, no peripheral edema  Musculoskeletal: No deformities, no cyanosis or clubbing  Neuro: alert, awake, non focal  Skin: Warm, no lesions or rash      Assessment & Plan:  Sarcoidosis CT chest with extensive scar, but stable, unchanged. Discussed with her today.   COPD (chronic obstructive pulmonary disease) ([facility]) Flaring cough and SOB, wheeze. Suspect this is due to allergy  season and worsening PND. P[lan to treat with pred taper  Please take prednisone as directed until completely gone. Continue Breztri 2 puffs twice a day.  Rinse and gargle after using. Continue your albuterol 1 nebulizer treatment or 2 puffs up to every 4 hours if needed for shortness of breath, chest tightness, wheezing. Follow with Dr Lamonte Sakai in  6 months or sooner if you have any problems  Cough Contribution of GERD and rhinitis  Continue your Nasonex nasal spray Continue Zyrtec Continue your Protonix once daily, Pepcid as needed.  Baltazar Apo, MD, PhD 01/16/2021, 5:11 PM [facility] [phone] or if no answer [phone]

## 2021-01-16 NOTE — Assessment & Plan Note (Signed)
CT chest with extensive scar, but stable, unchanged. Discussed with her today.

## 2021-01-21 ENCOUNTER — Encounter: Payer: Self-pay | Admitting: Nurse Practitioner

## 2021-01-21 NOTE — Progress Notes (Signed)
Office Visit    Patient Name: Kristen Suarez Date of Encounter: 01/22/2021  PCP:  Flossie Buffy, NP   [place]  Cardiologist:  Skeet Latch, MD / Dr. Fletcher Anon ([facility]) Advanced Practice Provider:  No care team member to display Electrophysiologist:  Virl Axe, MD      Chief Complaint    Kristen Suarez is a 75 y.o. female with a hx of renovascular hypertension, syncope, coronary calcification on CT, pulmonary sarcoidosis, asthma, hyperlipidemia presents today for ED visit follow up   Past Medical History    Past Medical History:  Diagnosis Date   Arthritis    Asthma    CAD in native artery 08/15/2020   Chickenpox    Colon polyps 2010   Heart murmur    History of fainting spells of unknown cause    Hyperlipidemia    Hypertension    Sarcoid 06/16/1976   Past Surgical History:  Procedure Laterality Date   ABDOMINAL HYSTERECTOMY  1987   BASAL CELL CARCINOMA EXCISION  06/30/2020   nose   LUNG BIOPSY  06/16/1976   PERIPHERAL VASCULAR INTERVENTION Right 11/29/2020   Procedure: PERIPHERAL VASCULAR INTERVENTION;  Surgeon: Wellington Hampshire, MD;  Location: [facility];  Service: Cardiovascular;  Laterality: Right;  Renal Artery   RENAL ANGIOGRAPHY N/A 11/29/2020   Procedure: RENAL ANGIOGRAPHY;  Surgeon: Wellington Hampshire, MD;  Location: [facility];  Service: Cardiovascular;  Laterality: N/A;    Allergies  Allergies  Allergen Reactions   Penicillins Itching   Sulfa Antibiotics Itching   Keflex [Cephalexin] Rash    History of Present Illness    Kristen Suarez is a 75 y.o. female with a hx of renovascular hypertension, syncope, coronary calcification on CT, pulmonary sarcoidosis, asthma, hyperlipidemia  last seen 12/19/20.  She had ILR placed in [place] January 2019 due to syncope. This is now followed by Dr. Caryl Comes. She was referred by her primary care provider to Dr. Oval Linsey for management of hypertension. Renal artery  duplex June 2022 with significant right renal artery stenosis with peak velocity 291 with bruit, no flow into left renal artery. She had angiography with Dr. 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 A few days post procedure she had hypotension and near syncope, HCTZ and Spironolactone were discontinued.   ED visit 01/03/21 with syncope while doing chair yoga at [facility]. Her BP on arrival to ED was 118/68. Dr. Fletcher Anon and Dr. Oval Linsey recommended she decrease Amlodipine to 5mg  QD and Irbesartan to 150mg  QD.   She presents today for follow up. BP at home 130s-140s/60s. No recurrent syncope. Reports no shortness of breath nor dyspnea on exertion. Reports no chest pain, pressure, or tightness. No edema, orthopnea, PND. Reports no palpitations.  Has not yet been back to exercising but plans to resume.  EKGs/Labs/Other Studies Reviewed:   The following studies were reviewed today:  EKG:  No EKG today.  Recent Labs: 02/29/2020: TSH 2.32 11/21/2020: ALT 21 01/03/2021: BUN 24; Creatinine, Ser 1.14; Hemoglobin 12.8; Platelets 227; Potassium 3.6; Sodium 136  Recent Lipid Panel    Component Value Date/Time   CHOL 209 (H) 11/21/2020 0918   TRIG 175 (H) 11/21/2020 0918   HDL 50 11/21/2020 0918   CHOLHDL 4.2 11/21/2020 0918   CHOLHDL 3 07/28/2020 0840   VLDL 23.8 07/28/2020 0840  LDLCALC 128 (H) 11/21/2020 0918     Home Medications   Current Meds  Medication Sig   acetaminophen (TYLENOL) 500 MG tablet Take 500 mg by mouth every 6 (six) hours as needed for moderate pain.   albuterol (PROVENTIL) (2.5 MG/3ML) 0.083% nebulizer solution USE 1 VIAL VIA NEBULIZER EVERY 6 HOURS (Patient taking differently: Take 2.5 mg by nebulization See admin instructions. Inhale the contents of 1 vial in the morning, may use a second vial as needed for shortness of breath)    albuterol (VENTOLIN HFA) 108 (90 Base) MCG/ACT inhaler Inhale 2 puffs into the lungs every 6 (six) hours as needed for wheezing or shortness of breath.   amLODipine (NORVASC) 5 MG tablet Take 1 tablet (5 mg total) by mouth daily.   aspirin EC 81 MG tablet Take 1 tablet (81 mg total) by mouth daily. Swallow whole.   Budeson-Glycopyrrol-Formoterol (BREZTRI AEROSPHERE) 160-9-4.8 MCG/ACT AERO Inhale 2 puffs into the lungs in the morning and at bedtime.   Calcium Carb-Cholecalciferol (CALCIUM 600 + D PO) Take 1 tablet by mouth daily.   cetirizine (ZYRTEC) 10 MG tablet Take 10 mg by mouth daily as needed for allergies.   clopidogrel (PLAVIX) 75 MG tablet Take 1 tablet (75 mg total) by mouth daily.   COVID-19 mRNA bivalent vaccine, Pfizer, injection Inject into the muscle.   docusate sodium (COLACE) 100 MG capsule Take 100 mg by mouth daily.    famotidine (PEPCID) 10 MG tablet Take 10 mg by mouth daily as needed for heartburn.   influenza vaccine adjuvanted (FLUAD) 0.5 ML injection Inject into the muscle.   irbesartan (AVAPRO) 150 MG tablet Take 1 tablet (150 mg total) by mouth daily.   mometasone (NASONEX) 50 MCG/ACT nasal spray Place 2 sprays into the nose daily.   Multiple Vitamins-Minerals (MULTIVITAL PO) Take 1 tablet by mouth daily.    pantoprazole (PROTONIX) 40 MG tablet TAKE 1 TABLET(40 MG) BY MOUTH DAILY   predniSONE (DELTASONE) 10 MG tablet Take 4 tablets X 3 days, 3 tabs X 3 days, 2 tabs x 3 days, 1 tab x 3 days   PREVIDENT 5000 BOOSTER PLUS 1.1 % PSTE Place 1 application onto teeth 3 (three) times a week.   Probiotic Product (PROBIOTIC DAILY PO) Take 1 tablet by mouth daily.    rosuvastatin (CRESTOR) 40 MG tablet Take 1 tablet (40 mg total) by mouth daily.   sodium chloride (OCEAN) 0.65 % SOLN nasal spray Place 1 spray into both nostrils as needed for congestion.     Review of Systems      All other systems reviewed and are otherwise negative except as noted above.  Physical Exam     VS:  BP (!) 152/74 (BP Location: Left Arm, Patient Position: Sitting, Cuff Size: Normal)   Pulse 76   Ht 5\' 3"  (1.6 m)   Wt 146 lb 14.4 oz (66.6 kg)   SpO2 98%   BMI 26.02 kg/m  , BMI Body mass index is 26.02 kg/m.  Wt Readings from Last 3 Encounters:  01/22/21 146 lb 14.4 oz (66.6 kg)  01/16/21 145 lb 3.2 oz (65.9 kg)  01/03/21 141 lb (64 kg)    GEN: Well nourished, well developed, in no acute distress. HEENT: normal. Neck: Supple, no JVD, carotid bruits, or masses. Cardiac: RRR, no  rubs, or gallops. No clubbing, cyanosis, edema.  Radials/PT 2+ and equal bilaterally. Gr 6-7/1 systolic murmur. Respiratory:  Respirations regular and unlabored, clear to auscultation bilaterally. GI: Soft, nontender, nondistended.  MS: No deformity or atrophy. Skin: Warm and dry, no rash. Neuro:  Strength and sensation are intact. Psych: Normal affect.  Assessment & Plan    Syncope - Vasovagal in nature. Has ILR and follows with Dr. Caryl Comes. Management of hypertension, as below. Will update echo to rule out significant valvular abnormality.   HTN - BP elevated goal of >130/80. She will continue Irbesartan 150mg  QD in the morning. Will change her Amlodipine to 5mg  QD in the evening. She will monitor BP daily and report in 1 week. If BP still not at goal, consider increase Amlodipine 10mg  QD or add HCTZ 12.5mg  QD.   Renal artery stenosis - s/p stenting to right renal artery. Follows with Dr. Fletcher Anon.   HLD - 11/21/20 total cholesterol 209, HDL 50, triglycerides 175, LDL 128. Rosuvastatin was increased to 40mg  QD. Due for lipid panel. Has upcoming labs with PCP and will request add on. If LDL not at goal, plan to add Zetia vs transition to [id number].  Coronary calcification on CT - Stable with no anginal symptoms. No indication for ischemic evaluation.  GDMT includes aspirin, rosuvastatin. Heart healthy diet and regular cardiovascular exercise encouraged.    Pulmonary sarcoidosis - Follows with pulmonology.    Disposition: Follow up in 2-3 month(s) with Dr. Oval Linsey or APP.  Signed, Loel Dubonnet, NP 01/22/2021, 9:40 AM [place]

## 2021-01-22 ENCOUNTER — Other Ambulatory Visit: Payer: Self-pay

## 2021-01-22 ENCOUNTER — Ambulatory Visit (INDEPENDENT_AMBULATORY_CARE_PROVIDER_SITE_OTHER): Payer: Medicare Other | Admitting: Family

## 2021-01-22 ENCOUNTER — Encounter (HOSPITAL_BASED_OUTPATIENT_CLINIC_OR_DEPARTMENT_OTHER): Payer: Self-pay | Admitting: Family

## 2021-01-22 VITALS — BP 152/74 | HR 76 | Ht 63.0 in | Wt 146.9 lb

## 2021-01-22 DIAGNOSIS — I701 Atherosclerosis of renal artery: Secondary | ICD-10-CM

## 2021-01-22 DIAGNOSIS — I2510 Atherosclerotic heart disease of native coronary artery without angina pectoris: Secondary | ICD-10-CM

## 2021-01-22 DIAGNOSIS — I10 Essential (primary) hypertension: Secondary | ICD-10-CM

## 2021-01-22 DIAGNOSIS — R55 Syncope and collapse: Secondary | ICD-10-CM | POA: Diagnosis not present

## 2021-01-22 MED ORDER — AMLODIPINE BESYLATE 5 MG PO TABS: 5.0000 mg | ORAL_TABLET | Freq: Every day | ORAL | 4 refills | Status: DC

## 2021-01-22 NOTE — Patient Instructions (Signed)
Medication Instructions:  Your physician has recommended you make the following change in your medication:    CONTINUE Irbesartan in the morning  CHANGE Amlodipine to the evening *take half tablet tonight *tomorrow take a whole tablet  TAKE Hydrochlorothiazide half tablet (12.5mg ) today *if your blood pressure is consistently high we will consider adding this back. Send Kristen Dubonnet, NP  a list of BP on Friday  *If you need a refill on your cardiac medications before your next appointment, please call your pharmacy*   Lab Work: None ordered today.   Testing/Procedures: Your physician has requested that you have an echocardiogram. Echocardiography is a painless test that uses sound waves to create images of your heart. It provides your doctor with information about the size and shape of your heart and how well your heart's chambers and valves are working. This procedure takes approximately one hour. There are no restrictions for this procedure.    Follow-Up: At [facility], you and your health needs are our priority.  As part of our continuing mission to provide you with exceptional heart care, we have created designated Provider Care Teams.  These Care Teams include your primary Cardiologist (physician) and Advanced Practice Providers (APPs -  Physician Assistants and Nurse Practitioners) who all work together to provide you with the care you need, when you need it.  We recommend signing up for the patient portal called "MyChart".  Sign up information is provided on this After Visit Summary.  MyChart is used to connect with patients for Virtual Visits (Telemedicine).  Patients are able to view lab/test results, encounter notes, upcoming appointments, etc.  Non-urgent messages can be sent to your provider as well.   To learn more about what you can do with MyChart, go to [email or web address].    Your next appointment:   2-3 month(s)  The format for your next appointment:   In  Person  Provider:   Skeet Latch, MD   Other Instructions  Heart Healthy Diet Recommendations: A low-salt diet is recommended. Meats should be grilled, baked, or boiled. Avoid fried foods. Focus on lean protein sources like fish or chicken with vegetables and fruits. The American Heart Association is a Microbiologist!  American Heart Association Diet and Lifeystyle Recommendations    Exercise recommendations: The American Heart Association recommends 150 minutes of moderate intensity exercise weekly. Try 30 minutes of moderate intensity exercise 4-5 times per week. This could include walking, jogging, or swimming.

## 2021-01-25 MED ORDER — HYDROCODONE BIT-HOMATROP MBR 5-1.5 MG/5ML PO SOLN: 5.0000 mL | Freq: Four times a day (QID) | ORAL | 0 refills | Status: DC | PRN

## 2021-01-25 NOTE — Telephone Encounter (Signed)
Please advise on patient mychart message  Am still taking Mucinex DM.  Still taking half dose of Hydromet (Hydrocod/Homa) syrup once or twice a day.  Small bits of mucus still coming up for about a week.  I have about 2 doses left.  Can you order more for backup, if cough continues.   Still no fever, throat is looser, Slight runny nose, cough is tiring. Thank you.

## 2021-01-25 NOTE — Telephone Encounter (Signed)
Let her know that I sent script for Hydromet to her pharmacy.

## 2021-01-29 ENCOUNTER — Other Ambulatory Visit (HOSPITAL_BASED_OUTPATIENT_CLINIC_OR_DEPARTMENT_OTHER): Payer: Self-pay | Admitting: Family

## 2021-01-29 ENCOUNTER — Encounter (HOSPITAL_BASED_OUTPATIENT_CLINIC_OR_DEPARTMENT_OTHER): Payer: Self-pay

## 2021-01-29 ENCOUNTER — Other Ambulatory Visit: Payer: Self-pay

## 2021-01-29 ENCOUNTER — Encounter: Payer: Self-pay | Admitting: Nurse Practitioner

## 2021-01-29 ENCOUNTER — Other Ambulatory Visit (HOSPITAL_BASED_OUTPATIENT_CLINIC_OR_DEPARTMENT_OTHER): Payer: Self-pay

## 2021-01-29 ENCOUNTER — Ambulatory Visit (INDEPENDENT_AMBULATORY_CARE_PROVIDER_SITE_OTHER): Payer: Medicare Other | Admitting: Nurse Practitioner

## 2021-01-29 VITALS — BP 130/82 | HR 69 | Temp 96.9°F | Ht 62.0 in | Wt 145.4 lb

## 2021-01-29 DIAGNOSIS — I150 Renovascular hypertension: Secondary | ICD-10-CM | POA: Diagnosis not present

## 2021-01-29 DIAGNOSIS — Z23 Encounter for immunization: Secondary | ICD-10-CM | POA: Diagnosis not present

## 2021-01-29 DIAGNOSIS — R55 Syncope and collapse: Secondary | ICD-10-CM

## 2021-01-29 DIAGNOSIS — E782 Mixed hyperlipidemia: Secondary | ICD-10-CM | POA: Diagnosis not present

## 2021-01-29 LAB — COMPREHENSIVE METABOLIC PANEL
ALT: 17 U/L (ref 0–35)
AST: 15 U/L (ref 0–37)
Albumin: 4.3 g/dL (ref 3.5–5.2)
Alkaline Phosphatase: 83 U/L (ref 39–117)
BUN: 24 mg/dL — ABNORMAL HIGH (ref 6–23)
CO2: 31 mEq/L (ref 19–32)
Calcium: 9.6 mg/dL (ref 8.4–10.5)
Chloride: 98 mEq/L (ref 96–112)
Creatinine, Ser: 1.26 mg/dL — ABNORMAL HIGH (ref 0.40–1.20)
GFR: 41.76 mL/min — ABNORMAL LOW (ref >60.00)
Glucose, Bld: 89 mg/dL (ref 70–99)
Potassium: 3.6 mEq/L (ref 3.5–5.1)
Sodium: 138 mEq/L (ref 135–145)
Total Bilirubin: 0.6 mg/dL (ref 0.2–1.2)
Total Protein: 6.9 g/dL (ref 6.0–8.3)

## 2021-01-29 LAB — LIPID PANEL
Cholesterol: 177 mg/dL (ref 0–200)
HDL: 69.8 mg/dL (ref >39.00)
LDL Cholesterol: 74 mg/dL (ref 0–99)
NonHDL: 107.1
Total CHOL/HDL Ratio: 3
Triglycerides: 164 mg/dL — ABNORMAL HIGH (ref 0.0–149.0)
VLDL: 32.8 mg/dL (ref 0.0–40.0)

## 2021-01-29 MED ORDER — ZOSTER VAC RECOMB ADJUVANTED 50 MCG/0.5ML IM SUSR
0.5000 mL | Freq: Once | INTRAMUSCULAR | 0 refills | Status: AC
  Filled 2021-01-29: qty 0.5, 1d supply, fill #0

## 2021-01-29 NOTE — Patient Instructions (Addendum)
Go to lab for blood draw   I did not find any notes from EMS scanned in your chart.  Continue to monitor BP daily.  Maintain appt with cardiology.  Probiotics to prevent recurrent UTI. Proanthcyanidin 38mg  or  D-Mannose powder 2g or [place] probiotics or Feminine balance probiotics or

## 2021-01-29 NOTE — Progress Notes (Signed)
Subjective:  Patient ID: Kristen Suarez, female    DOB: Jan 14, 1946  Age: 75 y.o. MRN: [id number]  CC: Follow-up (6 month f/u on HTN and cholesterol./Pt is fasting. )  HPI HTN (hypertension) Reports repeated syncope since renal artery angioplasty 11/2020. Has 3 episodes. Last One led to ED visit. Waxing and waning BP, high mid afternoon per patient. Thursday after yoga: 171/78, 134/65, 145/75 (left arm) Friday after Nustep x 50mins: 140/68 (left arm) BP at goal today with amlodipine 5mg , and irbesartan 150mg  BP Readings from Last 3 Encounters:  01/29/21 130/82  01/22/21 (!) 152/74  01/16/21 (!) 144/78   Maintain current medications and appts with cardiology. Maintain appt for echocardiogram and carotid US per cardiology. Repeat CMP  Hyperlipidemia Repeat lipid panel Current use of crestor 40mg    Wt Readings from Last 3 Encounters:  01/29/21 145 lb 6.4 oz (66 kg)  01/22/21 146 lb 14.4 oz (66.6 kg)  01/16/21 145 lb 3.2 oz (65.9 kg)    Reviewed past Medical, Social and Family history today.  Outpatient Medications Prior to Visit  Medication Sig Dispense Refill   acetaminophen (TYLENOL) 500 MG tablet Take 500 mg by mouth every 6 (six) hours as needed for moderate pain.     albuterol (PROVENTIL) (2.5 MG/3ML) 0.083% nebulizer solution USE 1 VIAL VIA NEBULIZER EVERY 6 HOURS (Patient taking differently: Take 2.5 mg by nebulization See admin instructions. Inhale the contents of 1 vial in the morning, may use a second vial as needed for shortness of breath) 150 mL 5   albuterol (VENTOLIN HFA) 108 (90 Base) MCG/ACT inhaler Inhale 2 puffs into the lungs every 6 (six) hours as needed for wheezing or shortness of breath. 8 g 6   amLODipine (NORVASC) 5 MG tablet Take 1 tablet (5 mg total) by mouth at bedtime. 30 tablet 4   aspirin EC 81 MG tablet Take 1 tablet (81 mg total) by mouth daily. Swallow whole. 150 tablet 2   Budeson-Glycopyrrol-Formoterol (BREZTRI AEROSPHERE) 160-9-4.8 MCG/ACT  AERO Inhale 2 puffs into the lungs in the morning and at bedtime. 32.1 g 2   Calcium Carb-Cholecalciferol (CALCIUM 600 + D PO) Take 1 tablet by mouth daily.     cetirizine (ZYRTEC) 10 MG tablet Take 10 mg by mouth daily as needed for allergies.     clopidogrel (PLAVIX) 75 MG tablet Take 1 tablet (75 mg total) by mouth daily. 30 tablet 3   COVID-19 mRNA bivalent vaccine, Pfizer, injection Inject into the muscle. 0.3 mL 0   docusate sodium (COLACE) 100 MG capsule Take 100 mg by mouth daily.      famotidine (PEPCID) 10 MG tablet Take 10 mg by mouth daily as needed for heartburn.     HYDROcodone bit-homatropine (HYDROMET) 5-1.5 MG/5ML syrup Take 5 mLs by mouth every 6 (six) hours as needed for cough. 120 mL 0   influenza vaccine adjuvanted (FLUAD) 0.5 ML injection Inject into the muscle. 0.5 mL 0   irbesartan (AVAPRO) 150 MG tablet Take 1 tablet (150 mg total) by mouth daily. 30 tablet 4   mometasone (NASONEX) 50 MCG/ACT nasal spray Place 2 sprays into the nose daily. 1 each 3   Multiple Vitamins-Minerals (MULTIVITAL PO) Take 1 tablet by mouth daily.      pantoprazole (PROTONIX) 40 MG tablet TAKE 1 TABLET(40 MG) BY MOUTH DAILY 30 tablet 5   PREVIDENT 5000 BOOSTER PLUS 1.1 % PSTE Place 1 application onto teeth 3 (three) times a week.     Probiotic Product (  PROBIOTIC DAILY PO) Take 1 tablet by mouth daily.      rosuvastatin (CRESTOR) 40 MG tablet Take 1 tablet (40 mg total) by mouth daily. 90 tablet 3   sodium chloride (OCEAN) 0.65 % SOLN nasal spray Place 1 spray into both nostrils as needed for congestion.     predniSONE (DELTASONE) 10 MG tablet Take 4 tablets X 3 days, 3 tabs X 3 days, 2 tabs x 3 days, 1 tab x 3 days (Patient not taking: Reported on 01/29/2021) 30 tablet 0   No facility-administered medications prior to visit.    ROS See HPI  Objective:  BP 130/82 (BP Location: Left Arm, Patient Position: Sitting, Cuff Size: Normal)   Pulse 69   Temp (!) 96.9 F (36.1 C) (Temporal)   Ht 5'  2" (1.575 m)   Wt 145 lb 6.4 oz (66 kg)   SpO2 99%   BMI 26.59 kg/m   Physical Exam Cardiovascular:     Rate and Rhythm: Normal rate and regular rhythm.     Pulses: Normal pulses.     Heart sounds: Murmur heard.    No gallop.  Pulmonary:     Effort: Pulmonary effort is normal.     Breath sounds: Normal breath sounds.  Musculoskeletal:     Right lower leg: No edema.     Left lower leg: No edema.  Neurological:     Mental Status: She is alert and oriented to person, place, and time.    Assessment & Plan:  This visit occurred during the SARS-CoV-2 public health emergency.  Safety protocols were in place, including screening questions prior to the visit, additional usage of staff PPE, and extensive cleaning of exam room while observing appropriate contact time as indicated for disinfecting solutions.   [place]na was seen today for follow-up.  Diagnoses and all orders for this visit:  Renovascular hypertension -     Comprehensive metabolic panel  Mixed hyperlipidemia -     Lipid panel -     Comprehensive metabolic panel  Need for zoster vaccination -     Zoster Vaccine Adjuvanted [facility]) injection; Inject 0.5 mLs into the muscle once for 1 dose.  Problem List Items Addressed This Visit       Cardiovascular and Mediastinum   HTN (hypertension) - Primary    Reports repeated syncope since renal artery angioplasty 11/2020. Has 3 episodes. Last One led to ED visit. Waxing and waning BP, high mid afternoon per patient. Thursday after yoga: 171/78, 134/65, 145/75 (left arm) Friday after Nustep x 37mins: 140/68 (left arm) BP at goal today with amlodipine 5mg , and irbesartan 150mg  BP Readings from Last 3 Encounters:  01/29/21 130/82  01/22/21 (!) 152/74  01/16/21 (!) 144/78   Maintain current medications and appts with cardiology. Maintain appt for echocardiogram and carotid US per cardiology. Repeat CMP      Relevant Orders   Comprehensive metabolic panel (Completed)      Other   Hyperlipidemia    Repeat lipid panel Current use of crestor 40mg       Relevant Orders   Lipid panel (Completed)   Comprehensive metabolic panel (Completed)   Other Visit Diagnoses     Need for zoster vaccination       Relevant Medications   Zoster Vaccine Adjuvanted [facility]) injection      Follow-up: Return in about 6 months (around 07/29/2021) for HTN and , hyperlipidemia (fasting).  Wilfred Lacy, NP

## 2021-01-29 NOTE — Assessment & Plan Note (Addendum)
Reports repeated syncope since renal artery angioplasty 11/2020. Has 3 episodes. Last One led to ED visit. Waxing and waning BP, high mid afternoon per patient. Thursday after yoga: 171/78, 134/65, 145/75 (left arm) Friday after Nustep x 33mins: 140/68 (left arm) BP at goal today with amlodipine 5mg , and irbesartan 150mg  BP Readings from Last 3 Encounters:  01/29/21 130/82  01/22/21 (!) 152/74  01/16/21 (!) 144/78   Maintain current medications and appts with cardiology. Maintain appt for echocardiogram and carotid US per cardiology. Repeat CMP

## 2021-01-29 NOTE — Progress Notes (Signed)
Note from PCP received that Kristen Suarez had recurrent syncopal episode. Requested carotid duplex which I have ordered. Will route note to our scheduling team to see if we can schedule for same day as echocardiogram for ease of transportation.   Loel Dubonnet, NP

## 2021-01-29 NOTE — Assessment & Plan Note (Signed)
Repeat lipid panel Current use of crestor 40mg 

## 2021-01-30 ENCOUNTER — Telehealth (HOSPITAL_BASED_OUTPATIENT_CLINIC_OR_DEPARTMENT_OTHER): Payer: Self-pay | Admitting: Family

## 2021-01-30 NOTE — Telephone Encounter (Signed)
Spoke with patient to schedule the Carotid doppler ordered by Laurann Montana, NP---Echo and Carotid scheduled for  Friday 02/16/21 beginning at 9:00 am ----patient voiced her understanding.

## 2021-01-30 NOTE — Telephone Encounter (Signed)
Left message for patient to call and discuss scheduling the Carotid doppler ordered by Laurann Montana, NP

## 2021-02-01 ENCOUNTER — Other Ambulatory Visit (HOSPITAL_BASED_OUTPATIENT_CLINIC_OR_DEPARTMENT_OTHER): Payer: Medicare Other

## 2021-02-02 ENCOUNTER — Other Ambulatory Visit (HOSPITAL_BASED_OUTPATIENT_CLINIC_OR_DEPARTMENT_OTHER): Payer: Self-pay

## 2021-02-05 ENCOUNTER — Ambulatory Visit: Payer: Medicare Other | Admitting: Nurse Practitioner

## 2021-02-05 ENCOUNTER — Encounter: Payer: Self-pay | Admitting: Nurse Practitioner

## 2021-02-05 ENCOUNTER — Other Ambulatory Visit: Payer: Self-pay

## 2021-02-05 ENCOUNTER — Encounter: Payer: Self-pay | Admitting: Cardiovascular Disease

## 2021-02-05 ENCOUNTER — Ambulatory Visit (INDEPENDENT_AMBULATORY_CARE_PROVIDER_SITE_OTHER): Payer: Medicare Other

## 2021-02-05 ENCOUNTER — Encounter (HOSPITAL_BASED_OUTPATIENT_CLINIC_OR_DEPARTMENT_OTHER): Payer: Self-pay

## 2021-02-05 VITALS — BP 142/70 | HR 93 | Temp 98.3°F | Ht 63.0 in | Wt 144.4 lb

## 2021-02-05 DIAGNOSIS — R053 Chronic cough: Secondary | ICD-10-CM

## 2021-02-05 DIAGNOSIS — J441 Chronic obstructive pulmonary disease with (acute) exacerbation: Secondary | ICD-10-CM

## 2021-02-05 DIAGNOSIS — J301 Allergic rhinitis due to pollen: Secondary | ICD-10-CM

## 2021-02-05 DIAGNOSIS — D869 Sarcoidosis, unspecified: Secondary | ICD-10-CM

## 2021-02-05 MED ORDER — DOXYCYCLINE HYCLATE 100 MG PO TABS: 100.0000 mg | ORAL_TABLET | Freq: Two times a day (BID) | ORAL | 0 refills | Status: DC

## 2021-02-05 MED ORDER — DM-GUAIFENESIN ER 30-600 MG PO TB12: 1.0000 | ORAL_TABLET | Freq: Two times a day (BID) | ORAL | Status: DC

## 2021-02-05 MED ORDER — PREDNISONE 10 MG PO TABS: ORAL_TABLET | ORAL | 0 refills | Status: DC

## 2021-02-05 NOTE — Assessment & Plan Note (Signed)
Possible flare related to viral etiology. In no acute distress. Persistent symptoms despite previous prednisone taper. CXR today to rule out underlying infectious process.   Patient Instructions  Continue Breztri 2 puffs Twice daily Continue albuterol 3 mL nebs three times a day until symptoms have improved then return to as needed for shortness of breath or wheezing Continue albuterol inhaler 2 puffs as needed for shortness of breath or wheezing Continue Nasonex 2 sprays each nostril daily  Continue Protonix 40 mg daily Continue Saline nasal spray 2-3 times a day Continue Zyrtec 10 mg daily Continue pepcid as needed for GERD symptoms  Prednisone taper pack. 4 tabs for 3 days, then 3 tabs for 3 days, 2 tabs for 3 days, then 1 tab for 3 days, then stop  Chest x ray today to rule out underlying infection. We will notify you of your results and if an antibiotic is necessary.  Flutter valve throughout the day Mucinex DM for cough and congestion twice a day over the counter Activity as tolerated.  Notify if fever or chills or worsening symptoms develop.   Follow up in one month with Dr. Lamonte Sakai, Roxan Diesel, NP, or APP. If symptoms do not improve or worsen, please contact office for sooner follow up or seek emergency care.

## 2021-02-05 NOTE — Progress Notes (Signed)
Please contact the patient and notify her that I do not see evidence of a pneumonia on her chest x ray. I am going to send in a prescription for doxycyline for sinus infection/COPD flare given her allergies to other antibiotics. She will take 100mg  Twice daily for 7 days. Increased risk for photosensitivity (not as likely given it's winter) and skin hyperpigmentation so tell her to use SPF protection if in the sun for extended periods.

## 2021-02-05 NOTE — Telephone Encounter (Signed)
RB please advise. Thanks.  

## 2021-02-05 NOTE — Progress Notes (Signed)
@Patient  ID: Kristen Suarez, female    DOB: [birth year], 75 y.o.   MRN: [id number]  Chief Complaint  Patient presents with   Acute Visit    Referring provider: Flossie Buffy, NP  HPI: 75 year old female, never smoker followed for sarcoidosis and associated severe obstructive lung disease with a component of restrictive lung disease as well. She has a chronic cough, allergic rhinitis, and GERD. She is a patient of Dr. Lamonte Sakai and last seen in clinic 01/16/2021. Past medical history significant for HTN and CAD in native artery.   TEST/EVENTS:  12/29/2020 CT Chest: unchanged mediastinal, hilar adenopathy, unchanged extensive mid lobe predominant traction bronchiectasis and architectural distoration. New small pericardial effusion. Emphysematous change.   01/16/2021: OV with Dr. Lamonte Sakai. Managed on [place]. Reported use of albuterol 3x/day. Increased cough suspected to be related to allergies and congestion reported. Discussed recent CT results with extensive scarring, but stable and unchanged results. COPD flare treated with prednisone taper. Breztri and albuterol continued. Continued Protonix, Nasonex nasal spray, zyrtec, pepcid as needed. F/u 6 months or PRN.   02/05/2021: Today - acute sick visit Patient presents today with persistent cough that started at the end of October. Her cough is productive with new yellow to green sputum. She also reports increased shortness of breath. Her highest temperature 98.7 F. She states she normally runs 96-40F. She denies wheezing, fever, chills, orthopnea, PND, hemoptysis, or leg swelling. She denies significant congestion but does have intermittent rhinorrhea that's clear. She avoids blowing her nose due to past nose bleeds r/t blood thinners. She has been using her nebulizer inhaler 2-3 times a day with relief. She continues on her Breztri inhaler 2 puffs, twice a day. She did complete a prednisone taper earlier this month, with moderate relief; however,  her symptoms returned once she completed this.    Allergies  Allergen Reactions   Penicillins Itching   Sulfa Antibiotics Itching   Keflex [Cephalexin] Rash    Immunization History  Administered Date(s) Administered   Fluad Quad(high Dose 65+) 11/19/2018, 12/22/2020   Influenza, High Dose Seasonal PF 12/11/2017   Influenza-Unspecified 11/24/2019, 11/24/2019   PFIZER Comirnaty(Gray Top)Covid-19 Tri-Sucrose Vaccine 06/27/2020   PFIZER(Purple Top)SARS-COV-2 Vaccination 05/14/2019, 06/08/2019, 12/20/2019   Pfizer Covid-19 Vaccine Bivalent Booster 33yrs & up 12/14/2020   Pneumococcal Conjugate-13 03/23/2014   Pneumococcal Polysaccharide-23 03/24/2012   Tdap 03/24/2012    Past Medical History:  Diagnosis Date   Arthritis    Asthma    CAD in native artery 08/15/2020   Chickenpox    Colon polyps 2010   Heart murmur    History of fainting spells of unknown cause    Hyperlipidemia    Hypertension    Sarcoid 06/16/1976    Tobacco History: Social History   Tobacco Use  Smoking Status Never  Smokeless Tobacco Never   Counseling given: Not Answered   Outpatient Medications Prior to Visit  Medication Sig Dispense Refill   acetaminophen (TYLENOL) 500 MG tablet Take 500 mg by mouth every 6 (six) hours as needed for moderate pain.     albuterol (PROVENTIL) (2.5 MG/3ML) 0.083% nebulizer solution USE 1 VIAL VIA NEBULIZER EVERY 6 HOURS (Patient taking differently: Take 2.5 mg by nebulization See admin instructions. Inhale the contents of 1 vial in the morning, may use a second vial as needed for shortness of breath) 150 mL 5   albuterol (VENTOLIN HFA) 108 (90 Base) MCG/ACT inhaler Inhale 2 puffs into the lungs every 6 (six) hours as needed  for wheezing or shortness of breath. 8 g 6   amLODipine (NORVASC) 5 MG tablet Take 1 tablet (5 mg total) by mouth at bedtime. 30 tablet 4   aspirin EC 81 MG tablet Take 1 tablet (81 mg total) by mouth daily. Swallow whole. 150 tablet 2    Budeson-Glycopyrrol-Formoterol (BREZTRI AEROSPHERE) 160-9-4.8 MCG/ACT AERO Inhale 2 puffs into the lungs in the morning and at bedtime. 32.1 g 2   Calcium Carb-Cholecalciferol (CALCIUM 600 + D PO) Take 1 tablet by mouth daily.     cetirizine (ZYRTEC) 10 MG tablet Take 10 mg by mouth daily as needed for allergies.     clopidogrel (PLAVIX) 75 MG tablet Take 1 tablet (75 mg total) by mouth daily. 30 tablet 3   COVID-19 mRNA bivalent vaccine, Pfizer, injection Inject into the muscle. 0.3 mL 0   docusate sodium (COLACE) 100 MG capsule Take 100 mg by mouth daily.      famotidine (PEPCID) 10 MG tablet Take 10 mg by mouth daily as needed for heartburn.     HYDROcodone bit-homatropine (HYDROMET) 5-1.5 MG/5ML syrup Take 5 mLs by mouth every 6 (six) hours as needed for cough. 120 mL 0   influenza vaccine adjuvanted (FLUAD) 0.5 ML injection Inject into the muscle. 0.5 mL 0   irbesartan (AVAPRO) 150 MG tablet Take 1 tablet (150 mg total) by mouth daily. 30 tablet 4   mometasone (NASONEX) 50 MCG/ACT nasal spray Place 2 sprays into the nose daily. 1 each 3   Multiple Vitamins-Minerals (MULTIVITAL PO) Take 1 tablet by mouth daily.      pantoprazole (PROTONIX) 40 MG tablet TAKE 1 TABLET(40 MG) BY MOUTH DAILY 30 tablet 5   PREVIDENT 5000 BOOSTER PLUS 1.1 % PSTE Place 1 application onto teeth 3 (three) times a week.     Probiotic Product (PROBIOTIC DAILY PO) Take 1 tablet by mouth daily.      rosuvastatin (CRESTOR) 40 MG tablet Take 1 tablet (40 mg total) by mouth daily. 90 tablet 3   sodium chloride (OCEAN) 0.65 % SOLN nasal spray Place 1 spray into both nostrils as needed for congestion.     No facility-administered medications prior to visit.     Review of Systems:   Constitutional: No weight loss or gain, night sweats, fevers, chills, fatigue, or lassitude. HEENT: No headaches, difficulty swallowing, tooth/dental problems, or sore throat. No sneezing, itching, ear ache, +nasal congestion(mild) with clear  rhinorrhea CV:  No chest pain, orthopnea, PND, swelling in lower extremities, anasarca, dizziness, palpitations, syncope Resp: +shortness of breath with exertion. Persistent cough with yellow sputum production. No hemoptysis. No wheezing.  No chest wall deformity GI:  No heartburn, indigestion, abdominal pain, nausea, vomiting, diarrhea, change in bowel habits, loss of appetite, bloody stools.  GU: No dysuria, change in color of urine, urgency or frequency.  No flank pain, no hematuria  Skin: No rash, lesions, ulcerations MSK:  No joint pain or swelling.  No decreased range of motion.  No back pain. Neuro: No dizziness or lightheadedness.  Psych: No depression or anxiety. Mood stable.     Physical Exam:  BP (!) 142/70 (BP Location: Right Arm, Patient Position: Sitting, Cuff Size: Normal)   Pulse 93   Temp 98.3 F (36.8 C) (Oral)   Ht 5\' 3"  (1.6 m)   Wt 144 lb 6.4 oz (65.5 kg)   SpO2 93%   BMI 25.58 kg/m   GEN: Pleasant, interactive, well-nourished; in no acute distress. HEENT:  Normocephalic and atraumatic. EACs  patent bilaterally. TM pearly gray with present light reflex bilaterally. PERRLA. Sclera white. Nasal turbinates pink, moist and patent bilaterally. No rhinorrhea present. Oropharynx erythematous and moist, without exudate or edema. No lesions, ulcerations, or postnasal drip.  NECK:  Supple w/ fair ROM. No JVD present. Normal carotid impulses w/o bruits. Thyroid symmetrical with no goiter or nodules palpated. No lymphadenopathy.   CV: RRR, no m/r/g, no peripheral edema. Pulses intact, +2 bilaterally. No cyanosis, pallor or clubbing. PULMONARY:  Unlabored, regular breathing. Coarse lung sounds bilaterally, w/o wheezes. No accessory muscle use. No dullness to percussion. GI: BS present and normoactive. Soft, non-tender to palpation. No organomegaly or masses detected. No CVA tenderness. MSK: No erythema, warmth or tenderness. Cap refil <2 sec all extrem. No deformities or joint  swelling noted.  Neuro: A/Ox3. No focal deficits noted.   Skin: Warm, no lesions or rashe Psych: Normal affect and behavior. Judgement and thought content appropriate.     Lab Results:  CBC    Component Value Date/Time   WBC 8.2 01/03/2021 1039   RBC 4.12 01/03/2021 1039   HGB 12.8 01/03/2021 1039   HGB 13.5 11/21/2020 1025   HCT 38.1 01/03/2021 1039   HCT 39.8 11/21/2020 1025   PLT 227 01/03/2021 1039   PLT 245 11/21/2020 1025   MCV 92.5 01/03/2021 1039   MCV 89 11/21/2020 1025   MCH 31.1 01/03/2021 1039   MCHC 33.6 01/03/2021 1039   RDW 13.4 01/03/2021 1039   RDW 13.0 11/21/2020 1025   LYMPHSABS 1.2 04/21/2019 1128   MONOABS 0.7 04/21/2019 1128   EOSABS 0.2 04/21/2019 1128   BASOSABS 0.0 04/21/2019 1128    BMET    Component Value Date/Time   NA 138 01/29/2021 0930   NA 138 12/19/2020 0934   K 3.6 01/29/2021 0930   CL 98 01/29/2021 0930   CO2 31 01/29/2021 0930   GLUCOSE 89 01/29/2021 0930   BUN 24 (H) 01/29/2021 0930   BUN 28 (H) 12/19/2020 0934   CREATININE 1.26 (H) 01/29/2021 0930   CALCIUM 9.6 01/29/2021 0930   GFRNONAA 50 (L) 01/03/2021 1039   GFRAA 57 (L) 05/05/2019 1129    BNP No results found for: BNP   Imaging:  No results found.    PFT Results Latest Ref Rng & Units 12/30/2018  FVC-Pre L 1.48  FVC-Predicted Pre % 50  FVC-Post L 1.49  FVC-Predicted Post % 50  Pre FEV1/FVC % % 69  Post FEV1/FCV % % 69  FEV1-Pre L 1.03  FEV1-Predicted Pre % 46  FEV1-Post L 1.03  DLCO uncorrected ml/min/mmHg 11.77  DLCO UNC% % 60  DLVA Predicted % 111  TLC L 3.67  TLC % Predicted % 71  RV % Predicted % 91    No results found for: NITRICOXIDE      Assessment & Plan:   COPD (chronic obstructive pulmonary disease) (HCC) Possible flare related to viral etiology. In no acute distress. Persistent symptoms despite previous prednisone taper. CXR today to rule out underlying infectious process.   Patient Instructions  Continue Breztri 2 puffs  Twice daily Continue albuterol 3 mL nebs three times a day until symptoms have improved then return to as needed for shortness of breath or wheezing Continue albuterol inhaler 2 puffs as needed for shortness of breath or wheezing Continue Nasonex 2 sprays each nostril daily  Continue Protonix 40 mg daily Continue Saline nasal spray 2-3 times a day Continue Zyrtec 10 mg daily Continue pepcid as needed for GERD symptoms  Prednisone taper pack. 4 tabs for 3 days, then 3 tabs for 3 days, 2 tabs for 3 days, then 1 tab for 3 days, then stop  Chest x ray today to rule out underlying infection. We will notify you of your results and if an antibiotic is necessary.  Flutter valve throughout the day Mucinex DM for cough and congestion twice a day over the counter Activity as tolerated.  Notify if fever or chills or worsening symptoms develop.   Follow up in one month with Dr. Lamonte Sakai, Roxan Diesel, NP, or APP. If symptoms do not improve or worsen, please contact office for sooner follow up or seek emergency care.    Allergic rhinitis See above plan.  Sarcoidosis See above plan.   Cough See above plan.     Clayton Bibles, NP 02/05/2021  Pt aware and understands NP's role.

## 2021-02-05 NOTE — Patient Instructions (Addendum)
Continue Breztri 2 puffs Twice daily Continue albuterol 3 mL nebs three times a day until symptoms have improved then return to as needed for shortness of breath or wheezing Continue albuterol inhaler 2 puffs as needed for shortness of breath or wheezing Continue Nasonex 2 sprays each nostril daily  Continue Protonix 40 mg daily Continue Saline nasal spray 2-3 times a day Continue Zyrtec 10 mg daily Continue pepcid as needed for GERD symptoms  Prednisone taper pack. 4 tabs for 3 days, then 3 tabs for 3 days, 2 tabs for 3 days, then 1 tab for 3 days, then stop  Chest x ray today to rule out underlying infection. We will notify you of your results and if an antibiotic is necessary.  Flutter valve throughout the day Mucinex DM for cough and congestion twice a day over the counter Activity as tolerated.  Notify if fever or chills or worsening symptoms develop.   Follow up in one month with Dr. Lamonte Sakai, Roxan Diesel, NP, or APP. If symptoms do not improve or worsen, please contact office for sooner follow up or seek emergency care.

## 2021-02-05 NOTE — Assessment & Plan Note (Signed)
See above plan. 

## 2021-02-09 ENCOUNTER — Other Ambulatory Visit (HOSPITAL_BASED_OUTPATIENT_CLINIC_OR_DEPARTMENT_OTHER): Payer: Self-pay

## 2021-02-13 ENCOUNTER — Other Ambulatory Visit: Payer: Self-pay

## 2021-02-13 ENCOUNTER — Ambulatory Visit (INDEPENDENT_AMBULATORY_CARE_PROVIDER_SITE_OTHER): Payer: Medicare Other

## 2021-02-13 VITALS — BP 134/74 | HR 82 | Temp 98.0°F | Ht 63.0 in | Wt 148.0 lb

## 2021-02-13 DIAGNOSIS — Z1231 Encounter for screening mammogram for malignant neoplasm of breast: Secondary | ICD-10-CM | POA: Diagnosis not present

## 2021-02-13 DIAGNOSIS — Z0000 Encounter for general adult medical examination without abnormal findings: Secondary | ICD-10-CM

## 2021-02-13 NOTE — Progress Notes (Signed)
Subjective:   Kristen Suarez is a 75 y.o. female who presents for Medicare Annual (Subsequent) preventive examination.  Review of Systems     Cardiac Risk Factors include: advanced age (>57men, >51 women);dyslipidemia;hypertension     Objective:    Today's Vitals   02/13/21 1334  BP: 134/74  Pulse: 82  Temp: 98 F (36.7 C)  SpO2: 98%  Weight: 148 lb (67.1 kg)  Height: 5\' 3"  (1.6 m)   Body mass index is 26.22 kg/m.  Advanced Directives 02/13/2021 01/03/2021 11/29/2020 04/19/2020 02/08/2020 01/27/2019  Does Patient Have a Medical Advance Directive? No No No No No No  Does patient want to make changes to medical advance directive? - - - - Yes (MAU/Ambulatory/Procedural Areas - Information given) -  Would patient like information on creating a medical advance directive? No - Patient declined - No - Patient declined No - Patient declined - No - Patient declined    Current Medications (verified) Outpatient Encounter Medications as of 02/13/2021  Medication Sig   acetaminophen (TYLENOL) 500 MG tablet Take 500 mg by mouth every 6 (six) hours as needed for moderate pain.   albuterol (PROVENTIL) (2.5 MG/3ML) 0.083% nebulizer solution USE 1 VIAL VIA NEBULIZER EVERY 6 HOURS (Patient taking differently: Take 2.5 mg by nebulization See admin instructions. Inhale the contents of 1 vial in the morning, may use a second vial as needed for shortness of breath)   albuterol (VENTOLIN HFA) 108 (90 Base) MCG/ACT inhaler Inhale 2 puffs into the lungs every 6 (six) hours as needed for wheezing or shortness of breath.   amLODipine (NORVASC) 5 MG tablet Take 1 tablet (5 mg total) by mouth at bedtime.   aspirin EC 81 MG tablet Take 1 tablet (81 mg total) by mouth daily. Swallow whole.   Budeson-Glycopyrrol-Formoterol (BREZTRI AEROSPHERE) 160-9-4.8 MCG/ACT AERO Inhale 2 puffs into the lungs in the morning and at bedtime.   Calcium Carb-Cholecalciferol (CALCIUM 600 + D PO) Take 1 tablet by mouth daily.    cetirizine (ZYRTEC) 10 MG tablet Take 10 mg by mouth daily as needed for allergies.   clopidogrel (PLAVIX) 75 MG tablet Take 1 tablet (75 mg total) by mouth daily.   dextromethorphan-guaiFENesin (MUCINEX DM) 30-600 MG 12hr tablet Take 1 tablet by mouth 2 (two) times daily.   docusate sodium (COLACE) 100 MG capsule Take 100 mg by mouth daily.    famotidine (PEPCID) 10 MG tablet Take 10 mg by mouth daily as needed for heartburn.   irbesartan (AVAPRO) 150 MG tablet Take 1 tablet (150 mg total) by mouth daily.   mometasone (NASONEX) 50 MCG/ACT nasal spray Place 2 sprays into the nose daily.   Multiple Vitamins-Minerals (MULTIVITAL PO) Take 1 tablet by mouth daily.    pantoprazole (PROTONIX) 40 MG tablet TAKE 1 TABLET(40 MG) BY MOUTH DAILY   predniSONE (DELTASONE) 10 MG tablet 4 tabs for 3 days, then 3 tabs for 3 days, 2 tabs for 3 days, then 1 tab for 3 days, then stop   PREVIDENT 5000 BOOSTER PLUS 1.1 % PSTE Place 1 application onto teeth 3 (three) times a week.   Probiotic Product (PROBIOTIC DAILY PO) Take 1 tablet by mouth daily.    rosuvastatin (CRESTOR) 40 MG tablet Take 1 tablet (40 mg total) by mouth daily.   sodium chloride (OCEAN) 0.65 % SOLN nasal spray Place 1 spray into both nostrils as needed for congestion.   COVID-19 mRNA bivalent vaccine, Pfizer, injection Inject into the muscle.   doxycycline (VIBRA-TABS)  100 MG tablet Take 1 tablet (100 mg total) by mouth 2 (two) times daily. (Patient not taking: Reported on 02/13/2021)   HYDROcodone bit-homatropine (HYDROMET) 5-1.5 MG/5ML syrup Take 5 mLs by mouth every 6 (six) hours as needed for cough. (Patient not taking: Reported on 02/13/2021)   influenza vaccine adjuvanted (FLUAD) 0.5 ML injection Inject into the muscle.   No facility-administered encounter medications on file as of 02/13/2021.    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   Past Surgical History:  Procedure Laterality Date   ABDOMINAL HYSTERECTOMY  1987   BASAL CELL CARCINOMA EXCISION  06/30/2020   nose   LUNG BIOPSY  06/16/1976   PERIPHERAL VASCULAR INTERVENTION Right 11/29/2020   Procedure: PERIPHERAL VASCULAR INTERVENTION;  Surgeon: Wellington Hampshire, MD;  Location: [facility];  Service: Cardiovascular;  Laterality: Right;  Renal Artery   RENAL ANGIOGRAPHY N/A 11/29/2020   Procedure: RENAL ANGIOGRAPHY;  Surgeon: Wellington Hampshire, MD;  Location: [facility];  Service: Cardiovascular;  Laterality: N/A;   Family History  Problem Relation Age of Onset   Arthritis Mother    Cancer Mother    Depression Mother    Heart attack Mother    Hyperlipidemia Mother    Hypertension Mother    Alcohol abuse Father    Heart disease Father    Hypertension Father    Hyperlipidemia Father    Heart disease Sister    Stroke Sister    Alcohol abuse Brother    Heart disease Brother    Hypertension Brother    Hyperlipidemia Brother    Multiple sclerosis Daughter    Hyperlipidemia Son    Heart disease Maternal Grandfather    Heart disease Paternal Grandfather    Hyperlipidemia Sister    Alcohol abuse Sister    Hyperlipidemia Sister    Hypertension Sister    Alcohol abuse Brother    Early death Brother    Heart attack Brother    Heart disease Brother    Hyperlipidemia Brother    Social History   Socioeconomic History   Marital status: Married    Spouse name: Not on file   Number of children: Not on file   Years of education: Not on file   Highest education level: Not on file  Occupational History   Occupation: retired  Tobacco Use   Smoking status: Never   Smokeless tobacco: Never  Vaping Use   Vaping Use: Never used   Substance and Sexual Activity   Alcohol use: Yes    Comment: once in a while   Drug use: Never   Sexual activity: Not Currently  Other Topics Concern   Not on file  Social History Narrative   Not on file   Social Determinants of Health   Financial Resource Strain: Low Risk    Difficulty of Paying Living Expenses: Not hard at all  Food Insecurity: No Food Insecurity   Worried About Charity fundraiser in the Last Year: Never true   [place] in the Last Year: Never true  Transportation Needs: No Transportation Needs   Lack of Transportation (  Medical): No   Lack of Transportation (Non-Medical): No  Physical Activity: Insufficiently Active   Days of Exercise per Week: 2 days   Minutes of Exercise per Session: 20 min  Stress: No Stress Concern Present   Feeling of Stress : Not at all  Social Connections: Moderately Isolated   Frequency of Communication with Friends and Family: Twice a week   Frequency of Social Gatherings with Friends and Family: Twice a week   Attends Religious Services: Never   Printmaker: No   Attends Music therapist: Never   Marital Status: Married    Tobacco Counseling Counseling given: Not Answered   Clinical Intake:  Pre-visit preparation completed: Yes  Pain : No/denies pain     Nutritional Risks: None Diabetes: No  How often do you need to have someone help you when you read instructions, pamphlets, or other written materials from your doctor or pharmacy?: 1 - Never What is the last grade level you completed in school?: masters  Diabetic?no   Interpreter Needed?: No  Information entered by :: [place] of Daily Living In your present state of health, do you have any difficulty performing the following activities: 02/13/2021  Hearing? N  Vision? N  Difficulty concentrating or making decisions? N  Walking or climbing stairs? N  Dressing or bathing? N  Doing errands,  shopping? N  Preparing Food and eating ? N  Using the Toilet? N  In the past six months, have you accidently leaked urine? N  Do you have problems with loss of bowel control? N  Managing your Medications? N  Managing your Finances? N  Housekeeping or managing your Housekeeping? N  Some recent data might be hidden    Patient Care Team: Nche, [name], NP as PCP - General (Internal Medicine) Deboraha Sprang, MD as PCP - Electrophysiology (Cardiology) Skeet Latch, MD as PCP - Cardiology (Cardiology) Feliz Beam, MD as Referring Physician (Internal Medicine) Collene Gobble, MD as Consulting Physician (Pulmonary Disease)  Indicate any recent Medical Services you may have received from other than [facility] providers in the past year (date may be approximate).     Assessment:   This is a routine wellness examination for Kristen Suarez.  Hearing/Vision screen Vision Screening - Comments:: Annual eye exams  Dietary issues and exercise activities discussed: Current Exercise Habits: The patient does not participate in regular exercise at present   Goals Addressed   None    Depression Screen PHQ 2/9 Scores 02/13/2021 02/13/2021 01/29/2021 08/15/2020 06/22/2020 03/31/2020 02/08/2020  PHQ - 2 Score 0 0 1 0 0 2 0  PHQ- 9 Score - - 6 - 0 5 -    Fall Risk Fall Risk  02/13/2021 01/29/2021 03/30/2020 02/29/2020 02/08/2020  Falls in the past year? 1 1 0 0 0  Number falls in past yr: 1 1 0 0 0  Injury with Fall? 0 1 0 0 0  Comment blood pressure issues - - - -  Risk for fall due to : History of fall(s) History of fall(s) - - -  Follow up Falls evaluation completed;Education provided Falls evaluation completed Falls evaluation completed - Falls prevention discussed    FALL RISK PREVENTION PERTAINING TO THE HOME:  Any stairs in or around the home? No  If so, are there any without handrails? No  Home free of loose throw rugs in walkways, pet beds, electrical cords, etc? Yes  Adequate  lighting in your home  to reduce risk of falls? Yes   ASSISTIVE DEVICES UTILIZED TO PREVENT FALLS:  Life alert? No  Use of a cane, walker or w/c? No  Grab bars in the bathroom? Yes  Shower chair or bench in shower? Yes  Elevated toilet seat or a handicapped toilet? Yes   TIMED UP AND GO:  Was the test performed? Yes .  Length of time to ambulate 10 feet: 12 sec.   Gait steady and fast without use of assistive device  Cognitive Function: Normal cognitive status assessed by direct observation by this Nurse Health Advisor. No abnormalities found.   MMSE - Mini Mental State Exam 09/08/2018  Orientation to time 5  Orientation to Place 5  Registration 3  Attention/ Calculation 5  Recall 3  Language- name 2 objects 2  Language- repeat 1  Language- follow 3 step command 3  Language- read & follow direction 1  Write a sentence 1  Copy design 1  Total score 30        Immunizations Immunization History  Administered Date(s) Administered   Fluad Quad(high Dose 65+) 11/19/2018, 12/22/2020   Influenza, High Dose Seasonal PF 12/11/2017   Influenza-Unspecified 11/24/2019, 11/24/2019   PFIZER Comirnaty(Gray Top)Covid-19 Tri-Sucrose Vaccine 06/27/2020   PFIZER(Purple Top)SARS-COV-2 Vaccination 05/14/2019, 06/08/2019, 12/20/2019   Pfizer Covid-19 Vaccine Bivalent Booster 78yrs & up 12/14/2020   Pneumococcal Conjugate-13 03/23/2014   Pneumococcal Polysaccharide-23 03/24/2012   Tdap 03/24/2012   Zoster Recombinat (Shingrix) 01/29/2021    TDAP status: Up to date  Flu Vaccine status: Up to date  Pneumococcal vaccine status: Up to date  Covid-19 vaccine status: Completed vaccines  Qualifies for Shingles Vaccine? Yes   Zostavax completed Yes   Shingrix Completed?: Yes  Screening Tests Health Maintenance  Topic Date Due   Hepatitis C Screening  Never done   Zoster Vaccines- Shingrix (2 of 2) 03/26/2021   TETANUS/TDAP  03/24/2022   COLONOSCOPY (Pts 45-31yrs Insurance coverage  will need to be confirmed)  01/21/2026   Pneumonia Vaccine 58+ Years old  Completed   INFLUENZA VACCINE  Completed   DEXA SCAN  Completed   COVID-19 Vaccine  Completed   HPV VACCINES  Aged Out    Health Maintenance  Health Maintenance Due  Topic Date Due   Hepatitis C Screening  Never done    Colorectal cancer screening: Type of screening: Colonoscopy. Completed 01/22/2016. Repeat every 10 years  Mammogram status: Ordered 02/13/2021. Pt provided with contact info and advised to call to schedule appt.   Bone Density status: Completed 03/14/2020. Results reflect: Bone density results: OSTEOPENIA. Repeat every 5 years.  Lung Cancer Screening: (Low Dose CT Chest recommended if Age 79-80 years, 30 pack-year currently smoking OR have quit w/in 15years.) does not qualify.   Lung Cancer Screening Referral: n/a  Additional Screening:  Hepatitis C Screening: does qualify;   Vision Screening: Recommended annual ophthalmology exams for early detection of glaucoma and other disorders of the eye. Is the patient up to date with their annual eye exam?  Yes  Who is the provider or what is the name of the office in which the patient attends annual eye exams? Dr.Hecker  If pt is not established with a provider, would they like to be referred to a provider to establish care? No .   Dental Screening: Recommended annual dental exams for proper oral hygiene  Community Resource Referral / Chronic Care Management: CRR required this visit?  No   CCM required this visit?  No  Plan:     I have personally reviewed and noted the following in the patient's chart:   Medical and social history Use of alcohol, tobacco or illicit drugs  Current medications and supplements including opioid prescriptions.  Functional ability and status Nutritional status Physical activity Advanced directives List of other physicians Hospitalizations, surgeries, and ER visits in previous 12  months Vitals Screenings to include cognitive, depression, and falls Referrals and appointments  In addition, I have reviewed and discussed with patient certain preventive protocols, quality metrics, and best practice recommendations. A written personalized care plan for preventive services as well as general preventive health recommendations were provided to patient.     Randel Pigg, LPN   80/22/3361   Nurse Notes: none

## 2021-02-13 NOTE — Patient Instructions (Signed)
Kristen Suarez , Thank you for taking time to come for your Medicare Wellness Visit. I appreciate your ongoing commitment to your health goals. Please review the following plan we discussed and let me know if I can assist you in the future.   Screening recommendations/referrals: Colonoscopy:01/22/2016 Mammogram: ordered 02/13/2021 Bone Density: 03/14/2020 Recommended yearly ophthalmology/optometry visit for glaucoma screening and checkup Recommended yearly dental visit for hygiene and checkup  Vaccinations: Influenza vaccine: completed  Pneumococcal vaccine: completed  Tdap vaccine: completed  Shingles vaccine: completed     Advanced directives: none   Conditions/risks identified: none   Next appointment: none    Preventive Care 75 Years and Older, Female Preventive care refers to lifestyle choices and visits with your health care provider that can promote health and wellness. What does preventive care include? A yearly physical exam. This is also called an annual well check. Dental exams once or twice a year. Routine eye exams. Ask your health care provider how often you should have your eyes checked. Personal lifestyle choices, including: Daily care of your teeth and gums. Regular physical activity. Eating a healthy diet. Avoiding tobacco and drug use. Limiting alcohol use. Practicing safe sex. Taking low-dose aspirin every day. Taking vitamin and mineral supplements as recommended by your health care provider. What happens during an annual well check? The services and screenings done by your health care provider during your annual well check will depend on your age, overall health, lifestyle risk factors, and family history of disease. Counseling  Your health care provider may ask you questions about your: Alcohol use. Tobacco use. Drug use. Emotional well-being. Home and relationship well-being. Sexual activity. Eating habits. History of falls. Memory and ability to  understand (cognition). Work and work Statistician. Reproductive health. Screening  You may have the following tests or measurements: Height, weight, and BMI. Blood pressure. Lipid and cholesterol levels. These may be checked every 5 years, or more frequently if you are over 55 years old. Skin check. Lung cancer screening. You may have this screening every year starting at age 37 if you have a 30-pack-year history of smoking and currently smoke or have quit within the past 15 years. Fecal occult blood test (FOBT) of the stool. You may have this test every year starting at age 45. Flexible sigmoidoscopy or colonoscopy. You may have a sigmoidoscopy every 5 years or a colonoscopy every 10 years starting at age 47. Hepatitis C blood test. Hepatitis B blood test. Sexually transmitted disease (STD) testing. Diabetes screening. This is done by checking your blood sugar (glucose) after you have not eaten for a while (fasting). You may have this done every 1-3 years. Bone density scan. This is done to screen for osteoporosis. You may have this done starting at age 78. Mammogram. This may be done every 1-2 years. Talk to your health care provider about how often you should have regular mammograms. Talk with your health care provider about your test results, treatment options, and if necessary, the need for more tests. Vaccines  Your health care provider may recommend certain vaccines, such as: Influenza vaccine. This is recommended every year. Tetanus, diphtheria, and acellular pertussis (Tdap, Td) vaccine. You may need a Td booster every 10 years. Zoster vaccine. You may need this after age 14. Pneumococcal 13-valent conjugate (PCV13) vaccine. One dose is recommended after age 86. Pneumococcal polysaccharide (PPSV23) vaccine. One dose is recommended after age 40. Talk to your health care provider about which screenings and vaccines you need and how often  you need them. This information is not  intended to replace advice given to you by your health care provider. Make sure you discuss any questions you have with your health care provider. Document Released: 03/31/2015 Document Revised: 11/22/2015 Document Reviewed: 01/03/2015 Elsevier Interactive Patient Education  2017 [place] Prevention in the Home Falls can cause injuries. They can happen to people of all ages. There are many things you can do to make your home safe and to help prevent falls. What can I do on the outside of my home? Regularly fix the edges of walkways and driveways and fix any cracks. Remove anything that might make you trip as you walk through a door, such as a raised step or threshold. Trim any bushes or trees on the path to your home. Use bright outdoor lighting. Clear any walking paths of anything that might make someone trip, such as rocks or tools. Regularly check to see if handrails are loose or broken. Make sure that both sides of any steps have handrails. Any raised decks and porches should have guardrails on the edges. Have any leaves, snow, or ice cleared regularly. Use sand or salt on walking paths during winter. Clean up any spills in your garage right away. This includes oil or grease spills. What can I do in the bathroom? Use night lights. Install grab bars by the toilet and in the tub and shower. Do not use towel bars as grab bars. Use non-skid mats or decals in the tub or shower. If you need to sit down in the shower, use a plastic, non-slip stool. Keep the floor dry. Clean up any water that spills on the floor as soon as it happens. Remove soap buildup in the tub or shower regularly. Attach bath mats securely with double-sided non-slip rug tape. Do not have throw rugs and other things on the floor that can make you trip. What can I do in the bedroom? Use night lights. Make sure that you have a light by your bed that is easy to reach. Do not use any sheets or blankets that are  too big for your bed. They should not hang down onto the floor. Have a firm chair that has side arms. You can use this for support while you get dressed. Do not have throw rugs and other things on the floor that can make you trip. What can I do in the kitchen? Clean up any spills right away. Avoid walking on wet floors. Keep items that you use a lot in easy-to-reach places. If you need to reach something above you, use a strong step stool that has a grab bar. Keep electrical cords out of the way. Do not use floor polish or wax that makes floors slippery. If you must use wax, use non-skid floor wax. Do not have throw rugs and other things on the floor that can make you trip. What can I do with my stairs? Do not leave any items on the stairs. Make sure that there are handrails on both sides of the stairs and use them. Fix handrails that are broken or loose. Make sure that handrails are as long as the stairways. Check any carpeting to make sure that it is firmly attached to the stairs. Fix any carpet that is loose or worn. Avoid having throw rugs at the top or bottom of the stairs. If you do have throw rugs, attach them to the floor with carpet tape. Make sure that you have a light  switch at the top of the stairs and the bottom of the stairs. If you do not have them, ask someone to add them for you. What else can I do to help prevent falls? Wear shoes that: Do not have high heels. Have rubber bottoms. Are comfortable and fit you well. Are closed at the toe. Do not wear sandals. If you use a stepladder: Make sure that it is fully opened. Do not climb a closed stepladder. Make sure that both sides of the stepladder are locked into place. Ask someone to hold it for you, if possible. Clearly mark and make sure that you can see: Any grab bars or handrails. First and last steps. Where the edge of each step is. Use tools that help you move around (mobility aids) if they are needed. These  include: Canes. Walkers. Scooters. Crutches. Turn on the lights when you go into a dark area. Replace any light bulbs as soon as they burn out. Set up your furniture so you have a clear path. Avoid moving your furniture around. If any of your floors are uneven, fix them. If there are any pets around you, be aware of where they are. Review your medicines with your doctor. Some medicines can make you feel dizzy. This can increase your chance of falling. Ask your doctor what other things that you can do to help prevent falls. This information is not intended to replace advice given to you by your health care provider. Make sure you discuss any questions you have with your health care provider. Document Released: 12/29/2008 Document Revised: 08/10/2015 Document Reviewed: 04/08/2014 Elsevier Interactive Patient Education  2017 [employer].

## 2021-02-14 ENCOUNTER — Encounter (HOSPITAL_BASED_OUTPATIENT_CLINIC_OR_DEPARTMENT_OTHER): Payer: Self-pay

## 2021-02-14 DIAGNOSIS — I10 Essential (primary) hypertension: Secondary | ICD-10-CM

## 2021-02-14 MED ORDER — AMLODIPINE BESYLATE 5 MG PO TABS: 7.5000 mg | ORAL_TABLET | Freq: Every day | ORAL | 2 refills | Status: DC

## 2021-02-14 NOTE — Telephone Encounter (Signed)
Dr. Lamonte Sakai please advise on the following My Chart message,   [facility] Pool (supporting Hawley, Trout V, NP) 57 minutes ago (1:33 PM)  I feel like symptoms have plateaued. Finished Hydrocodone on Sunday evening.  Finished Doxy on Monday morning.  Will finish Pred on Saturday.  I am taking all meds as directed.  Still need OTC Dextromethorphan, cough drops and Vicks to function.  On the weekend a brought up green specks of mucus after a hard cough; the same happened today. SOB & wheezing relieved by meds and flutter valve.   I have low energy.  Have to break up tasks at home & exercise at [facility] into components to complete.  I not been able to walk for more than 8 minutes straight.  I move as much as I can (hourly) but am concerned about inactivity.  Thanks for any insight.   Thank you

## 2021-02-14 NOTE — Addendum Note (Signed)
Addended by: Loel Dubonnet on: 02/14/2021 04:23 PM   Modules accepted: Orders

## 2021-02-15 NOTE — Telephone Encounter (Signed)
I believe she is doing all of the right things. It can take some time to recover from an acute flare up. Agree with her current meds and working on slowly / steadily increasing her activity.  If she is not improving in the next week then I would have her come in for a repeat CXR to compare with 11/21.

## 2021-02-16 ENCOUNTER — Ambulatory Visit (INDEPENDENT_AMBULATORY_CARE_PROVIDER_SITE_OTHER): Payer: Medicare Other

## 2021-02-16 ENCOUNTER — Encounter (HOSPITAL_BASED_OUTPATIENT_CLINIC_OR_DEPARTMENT_OTHER): Payer: Self-pay

## 2021-02-16 ENCOUNTER — Other Ambulatory Visit: Payer: Self-pay

## 2021-02-16 DIAGNOSIS — R55 Syncope and collapse: Secondary | ICD-10-CM | POA: Diagnosis not present

## 2021-02-16 DIAGNOSIS — I10 Essential (primary) hypertension: Secondary | ICD-10-CM

## 2021-02-16 LAB — ECHOCARDIOGRAM COMPLETE
AR max vel: 1.74 cm2
AV Area VTI: 1.88 cm2
AV Area mean vel: 1.97 cm2
AV Mean grad: 5.3 mmHg
AV Peak grad: 10.7 mmHg
Ao pk vel: 1.64 m/s
Area-P 1/2: 2.64 cm2
Calc EF: 75.8 %
S' Lateral: 2.26 cm
Single Plane A2C EF: 71.9 %
Single Plane A4C EF: 77 %

## 2021-02-16 NOTE — Progress Notes (Signed)
Pt. Results shared via mychart

## 2021-02-19 ENCOUNTER — Encounter (HOSPITAL_BASED_OUTPATIENT_CLINIC_OR_DEPARTMENT_OTHER): Payer: Self-pay

## 2021-02-19 NOTE — Progress Notes (Signed)
Seen by patient Kristen Suarez on 02/16/2021  2:23 PM

## 2021-02-19 NOTE — Progress Notes (Signed)
Seen by patient Kristen Suarez on 02/19/2021  8:58 AM

## 2021-02-19 NOTE — Progress Notes (Signed)
Results shared via mychart

## 2021-02-22 MED ORDER — AMLODIPINE BESYLATE 5 MG PO TABS: 10.0000 mg | ORAL_TABLET | Freq: Every day | ORAL | 2 refills | Status: DC

## 2021-02-22 NOTE — Telephone Encounter (Signed)
Patient was seen by Overton Mam NP 01/22/2021

## 2021-02-22 NOTE — Addendum Note (Signed)
Addended by: Loel Dubonnet on: 02/22/2021 11:02 AM   Modules accepted: Orders

## 2021-02-22 NOTE — Telephone Encounter (Signed)
Belenda Cruise, today is the first I have not coughed up any mucus.  Continuing on all meds as directed.  My body hurts from coughing and inactivity but today I have a glimmer of hope.  My nose and throat are clearer.  I will walk my driveway a bit today and will try some yoga.  This has been a difficult episode.  Any advice you can give me to recognize, treat and shorten future episodes  would be greatly appreciated. Thanks for everything!   Message routed to Dexter, NP

## 2021-02-22 NOTE — Telephone Encounter (Signed)
Patient was seen by Overton Mam NP 11/7

## 2021-03-01 ENCOUNTER — Encounter: Payer: Self-pay | Admitting: Emergency Medicine

## 2021-03-01 DIAGNOSIS — J441 Chronic obstructive pulmonary disease with (acute) exacerbation: Secondary | ICD-10-CM

## 2021-03-01 DIAGNOSIS — R509 Fever, unspecified: Secondary | ICD-10-CM

## 2021-03-02 ENCOUNTER — Telehealth: Payer: Self-pay

## 2021-03-02 ENCOUNTER — Other Ambulatory Visit (HOSPITAL_COMMUNITY): Payer: Self-pay

## 2021-03-02 MED ORDER — ALBUTEROL SULFATE (2.5 MG/3ML) 0.083% IN NEBU: 2.5000 mg | INHALATION_SOLUTION | Freq: Four times a day (QID) | RESPIRATORY_TRACT | 5 refills | Status: DC | PRN

## 2021-03-02 NOTE — Telephone Encounter (Signed)
Patient Advocate Encounter   Received notification from [facility] that prior authorization for Albuterol Sulfate nebulizer solution is required by his/her insurance [employer].  PA submitted on 03/02/21  Key#: [id number]  Status is pending    [facility] will continue to follow:  Patient Advocate Fax:  [phone]

## 2021-03-02 NOTE — Telephone Encounter (Signed)
No PA is needed for Albuterol.  It is covered through Medicare Part B.  Pharmacy is aware.

## 2021-03-07 ENCOUNTER — Ambulatory Visit (INDEPENDENT_AMBULATORY_CARE_PROVIDER_SITE_OTHER): Payer: Medicare Other | Admitting: Nurse Practitioner

## 2021-03-07 ENCOUNTER — Other Ambulatory Visit: Payer: Self-pay

## 2021-03-07 ENCOUNTER — Encounter: Payer: Self-pay | Admitting: Nurse Practitioner

## 2021-03-07 VITALS — BP 138/74 | HR 87 | Temp 98.2°F | Ht 63.0 in | Wt 143.2 lb

## 2021-03-07 DIAGNOSIS — D869 Sarcoidosis, unspecified: Secondary | ICD-10-CM

## 2021-03-07 DIAGNOSIS — J301 Allergic rhinitis due to pollen: Secondary | ICD-10-CM

## 2021-03-07 DIAGNOSIS — J449 Chronic obstructive pulmonary disease, unspecified: Secondary | ICD-10-CM

## 2021-03-07 DIAGNOSIS — R509 Fever, unspecified: Secondary | ICD-10-CM

## 2021-03-07 DIAGNOSIS — K219 Gastro-esophageal reflux disease without esophagitis: Secondary | ICD-10-CM

## 2021-03-07 DIAGNOSIS — J441 Chronic obstructive pulmonary disease with (acute) exacerbation: Secondary | ICD-10-CM | POA: Diagnosis not present

## 2021-03-07 MED ORDER — ALBUTEROL SULFATE (2.5 MG/3ML) 0.083% IN NEBU: 2.5000 mg | INHALATION_SOLUTION | Freq: Four times a day (QID) | RESPIRATORY_TRACT | 5 refills | Status: DC | PRN

## 2021-03-07 MED ORDER — PREDNISONE 10 MG PO TABS: ORAL_TABLET | ORAL | 0 refills | Status: DC

## 2021-03-07 NOTE — Assessment & Plan Note (Signed)
Stable CT and CXR with severe chronic lung changes. No other systemic symptoms. ACE level today. PFTs to evaluate worsening restriction vs obstruction. See above.

## 2021-03-07 NOTE — Assessment & Plan Note (Addendum)
Continue current regimen with Zyrtec and Nasonex. See above.

## 2021-03-07 NOTE — Progress Notes (Signed)
_0  ID: Kristen Suarez, female    DOB: 1945-10-17, 75 y.o.   MRN: [id number]  Chief Complaint  Patient presents with   Follow-up    Still has cough/ unchanged SOB    Referring provider: Flossie Buffy, NP  HPI: 75 year old female, never smoker followed for sarcoidosis and associated severe obstructive lung disease with a component of restrictive lung disease as well.  She has a chronic cough, allergic rhinitis and GERD.  She is a patient of Dr. Agustina Caroli and was last seen in clinic 02/05/2021 by [facility] NP.  Past medical history significant for hypertension and CAD in native artery.  TEST/EVENTS:  12/30/2018 PFTs: FVC 1.49 (50), FEV1 1.03 (46), ratio 69, RV 2.08 (91), TLC 3.67 (71), DLCO uncorrected 11.77 (60) 12/29/2020 CT chest: Unchanged mediastinal, hilar adenopathy, unchanged extensive mid lobe predominant traction bronchiectasis and architectural distortion.  New small pericardial effusion.  Emphysematous change. 02/05/2021 CXR 2 view: Bilateral upper lobe volume loss with superior retraction of the hila particularly on the left.  Bilateral upper lobe scarring with calcified granuloma and left upper lobe consistent with history of sarcoidosis.  Appearance of the lungs is grossly unchanged since prior exam. 02/16/2021 echocardiogram: LVEF 60 to 65%.  G1 DD.  RV systolic function and size normal.  Normal PA pressure.  No valvular dysfunction.  Trivial pericardial effusion - no concern per cardiology.  02/05/2021: OV with Purvi Ruehl NP. Recent URI with increased SOB and cough. Previous CT chest with extensive scarring but stable. CXR at this visit showed severe chronic lung changes consistent with sarcoid, but unchanged from prior exam. Prednisone taper. Flutter valve and mucinex DM. Continue maintenance regimens for allergic rhinits and GERD.  03/07/2021: Today - follow up Patient presents today for follow up after URI and COPD flare. She continues to experience persistent shortness of  breath upon exertion; unable to complete 15 minutes of exercise without rest. She also has a productive cough with clear to white sputum. She has an occasional wheeze, relieved by her albuterol neb or inhaler. She denies fever, chills, orthopnea, PND, chest pain, or lower extremity swelling. She continues on [place] Twice daily and has been using her nebulizer treatments two times a day. She occasionally uses her rescue inhaler. She continues on Zyrtec, nasocort, and protonix daily. Her recent echo did not show any evidence of right heart strain or pulmonary hypertension. She continues to be active and goes to the gym multiple times a week to walk. Overall, she feels somewhat better but is still concerned about her shortness of breath and the impact on her activity level.    Allergies  Allergen Reactions   Penicillins Itching   Sulfa Antibiotics Itching   Keflex [Cephalexin] Rash    Immunization History  Administered Date(s) Administered   Fluad Quad(high Dose 65+) 11/19/2018, 12/22/2020   Influenza, High Dose Seasonal PF 12/11/2017   Influenza-Unspecified 11/24/2019, 11/24/2019   PFIZER Comirnaty(Gray Top)Covid-19 Tri-Sucrose Vaccine 06/27/2020   PFIZER(Purple Top)SARS-COV-2 Vaccination 05/14/2019, 06/08/2019, 12/20/2019   Pfizer Covid-19 Vaccine Bivalent Booster 73yr & up 12/14/2020   Pneumococcal Conjugate-13 03/23/2014   Pneumococcal Polysaccharide-23 03/24/2012   Tdap 03/24/2012   Zoster Recombinat (Shingrix) 01/29/2021    Past Medical History:  Diagnosis Date   Arthritis    Asthma    CAD in native artery 08/15/2020   Chickenpox    Colon polyps 2010   Heart murmur    History of fainting spells of unknown cause    Hyperlipidemia    Hypertension  Sarcoid 06/16/1976    Tobacco History: Social History   Tobacco Use  Smoking Status Never  Smokeless Tobacco Never   Counseling given: Not Answered   Outpatient Medications Prior to Visit  Medication Sig Dispense Refill    acetaminophen (TYLENOL) 500 MG tablet Take 500 mg by mouth every 6 (six) hours as needed for moderate pain.     albuterol (VENTOLIN HFA) 108 (90 Base) MCG/ACT inhaler Inhale 2 puffs into the lungs every 6 (six) hours as needed for wheezing or shortness of breath. 8 g 6   amLODipine (NORVASC) 5 MG tablet Take 2 tablets (10 mg total) by mouth at bedtime. 45 tablet 2   aspirin EC 81 MG tablet Take 1 tablet (81 mg total) by mouth daily. Swallow whole. 150 tablet 2   Budeson-Glycopyrrol-Formoterol (BREZTRI AEROSPHERE) 160-9-4.8 MCG/ACT AERO Inhale 2 puffs into the lungs in the morning and at bedtime. 32.1 g 2   Calcium Carb-Cholecalciferol (CALCIUM 600 + D PO) Take 1 tablet by mouth daily.     cetirizine (ZYRTEC) 10 MG tablet Take 10 mg by mouth daily as needed for allergies.     clopidogrel (PLAVIX) 75 MG tablet Take 1 tablet (75 mg total) by mouth daily. 30 tablet 3   COVID-19 mRNA bivalent vaccine, Pfizer, injection Inject into the muscle. 0.3 mL 0   docusate sodium (COLACE) 100 MG capsule Take 100 mg by mouth daily.      doxycycline (VIBRA-TABS) 100 MG tablet Take 1 tablet (100 mg total) by mouth 2 (two) times daily. 14 tablet 0   famotidine (PEPCID) 10 MG tablet Take 10 mg by mouth daily as needed for heartburn.     influenza vaccine adjuvanted (FLUAD) 0.5 ML injection Inject into the muscle. 0.5 mL 0   irbesartan (AVAPRO) 150 MG tablet Take 1 tablet (150 mg total) by mouth daily. 30 tablet 4   mometasone (NASONEX) 50 MCG/ACT nasal spray Place 2 sprays into the nose daily. 1 each 3   Multiple Vitamins-Minerals (MULTIVITAL PO) Take 1 tablet by mouth daily.      pantoprazole (PROTONIX) 40 MG tablet TAKE 1 TABLET(40 MG) BY MOUTH DAILY 30 tablet 5   PREVIDENT 5000 BOOSTER PLUS 1.1 % PSTE Place 1 application onto teeth 3 (three) times a week.     Probiotic Product (PROBIOTIC DAILY PO) Take 1 tablet by mouth daily.      rosuvastatin (CRESTOR) 40 MG tablet Take 1 tablet (40 mg total) by mouth daily.  90 tablet 3   sodium chloride (OCEAN) 0.65 % SOLN nasal spray Place 1 spray into both nostrils as needed for congestion.     albuterol (PROVENTIL) (2.5 MG/3ML) 0.083% nebulizer solution Take 3 mLs (2.5 mg total) by nebulization every 6 (six) hours as needed for wheezing or shortness of breath. USE 1 VIAL VIA NEBULIZER EVERY 6 HOURS Strength: (2.5 MG/3ML) 0.083% 150 mL 5   predniSONE (DELTASONE) 10 MG tablet 4 tabs for 3 days, then 3 tabs for 3 days, 2 tabs for 3 days, then 1 tab for 3 days, then stop 30 tablet 0   dextromethorphan-guaiFENesin (MUCINEX DM) 30-600 MG 12hr tablet Take 1 tablet by mouth 2 (two) times daily. (Patient not taking: Reported on 03/07/2021)     HYDROcodone bit-homatropine (HYDROMET) 5-1.5 MG/5ML syrup Take 5 mLs by mouth every 6 (six) hours as needed for cough. (Patient not taking: Reported on 03/07/2021) 120 mL 0   No facility-administered medications prior to visit.     Review of Systems:  Constitutional: No weight loss or gain, night sweats, fevers, chills. +fatigue HEENT: No headaches, difficulty swallowing, tooth/dental problems, or sore throat. No sneezing, itching, ear ache, nasal congestion. +occasional nasal congestion (improves with nasal spray and Zyrtec) CV:  No chest pain, orthopnea, PND, swelling in lower extremities, anasarca, dizziness, palpitations, syncope Resp: +shortness of breath with exertion; productive cough with clear to white sputum; occasional wheeze. No excess mucus or change in color of mucus. No hemoptysis. No chest wall deformity GI:  No heartburn, indigestion, abdominal pain, nausea, vomiting, diarrhea, change in bowel habits, loss of appetite, bloody stools.  GU: No dysuria, change in color of urine, urgency or frequency.  No flank pain, no hematuria  Skin: No rash, lesions, ulcerations MSK:  No joint pain or swelling.  No decreased range of motion.  No back pain. Neuro: No dizziness or lightheadedness.  Psych: No depression or anxiety.  Mood stable.     Physical Exam:  BP 138/74 (BP Location: Right Arm, Patient Position: Sitting, Cuff Size: Normal)    Pulse 87    Temp 98.2 F (36.8 C) (Oral)    Ht _0  (1.6 m)    Wt 143 lb 3.2 oz (65 kg)    SpO2 99%    BMI 25.37 kg/m   GEN: Pleasant, interactive, well-nourished; in no acute distress. HEENT:  Normocephalic and atraumatic. EACs patent bilaterally. TM pearly gray with present light reflex bilaterally. PERRLA. Sclera white. Nasal turbinates pink, moist and patent bilaterally. No rhinorrhea present. Oropharynx pink and moist, without exudate or edema. No lesions, ulcerations, or postnasal drip.  NECK:  Supple w/ fair ROM. No JVD present. Normal carotid impulses w/o bruits. Thyroid symmetrical with no goiter or nodules palpated. No lymphadenopathy.   CV: RRR, no m/r/g, no peripheral edema. Pulses intact, +2 bilaterally. No cyanosis, pallor or clubbing. PULMONARY:  Unlabored, regular breathing. Scattered rhonchi posteriorly w/o wheezes. No accessory muscle use. No dullness to percussion. GI: BS present and normoactive. Soft, non-tender to palpation. No organomegaly or masses detected. No CVA tenderness. MSK: No erythema, warmth or tenderness. Cap refil <2 sec all extrem. No deformities or joint swelling noted.  Neuro: A/Ox3. No focal deficits noted.   Skin: Warm, no lesions or rashe Psych: Normal affect and behavior. Judgement and thought content appropriate.     Lab Results:  CBC    Component Value Date/Time   WBC 8.2 01/03/2021 1039   RBC 4.12 01/03/2021 1039   HGB 12.8 01/03/2021 1039   HGB 13.5 11/21/2020 1025   HCT 38.1 01/03/2021 1039   HCT 39.8 11/21/2020 1025   PLT 227 01/03/2021 1039   PLT 245 11/21/2020 1025   MCV 92.5 01/03/2021 1039   MCV 89 11/21/2020 1025   MCH 31.1 01/03/2021 1039   MCHC 33.6 01/03/2021 1039   RDW 13.4 01/03/2021 1039   RDW 13.0 11/21/2020 1025   LYMPHSABS 1.2 04/21/2019 1128   MONOABS 0.7 04/21/2019 1128   EOSABS 0.2 04/21/2019  1128   BASOSABS 0.0 04/21/2019 1128    BMET    Component Value Date/Time   NA 138 01/29/2021 0930   NA 138 12/19/2020 0934   K 3.6 01/29/2021 0930   CL 98 01/29/2021 0930   CO2 31 01/29/2021 0930   GLUCOSE 89 01/29/2021 0930   BUN 24 (H) 01/29/2021 0930   BUN 28 (H) 12/19/2020 0934   CREATININE 1.26 (H) 01/29/2021 0930   CALCIUM 9.6 01/29/2021 0930   GFRNONAA 50 (L) 01/03/2021 1039   GFRAA 57 (L)  05/05/2019 1129    BNP No results found for: BNP   Imaging:  DG Chest 2 View  Result Date: 02/05/2021 CLINICAL DATA:  Persistent and worsening cough, COPD exacerbation, history hypertension, GERD, sarcoidosis EXAM: CHEST - 2 VIEW COMPARISON:  04/21/2019 FINDINGS: Normal heart size, mediastinal contours, and pulmonary vascularity. BILATERAL upper lobe volume loss with superior retraction of the hila particularly on LEFT. BILATERAL upper lobe scarring with calcified granuloma in LEFT upper lobe consistent with history of sarcoidosis. Appearance of the lungs is grossly unchanged since the prior study. No definite acute infiltrate, pleural effusion, or pneumothorax. Loop recorder projects over lower LEFT chest. No acute osseous findings. Atherosclerotic calcifications aorta. IMPRESSION: Severe chronic lung changes consistent with sarcoidosis, greatest in LEFT upper lobe. No acute abnormalities. Aortic Atherosclerosis (ICD10-I70.0). Electronically Signed   By: Lavonia Dana M.D.   On: 02/05/2021 17:00   ECHOCARDIOGRAM COMPLETE  Result Date: 02/16/2021    ECHOCARDIOGRAM REPORT   Patient Name:   Kristen Suarez Date of Exam: 02/16/2021 Medical Rec #:  [id number]         Height:       63.0 in Accession #:    [id number]        Weight:       148.0 lb Date of Birth:  May 02, 1945         BSA:          1.701 m Patient Age:    52 years          BP:           134/74 mmHg Patient Gender: F                 HR:           77 bpm. Exam Location:  Outpatient Procedure: 2D Echo, Cardiac Doppler, Color Doppler and  Strain Analysis Indications:    R55 Syncope  History:        Patient has prior history of Echocardiogram examinations, most                 recent 01/01/2019. CAD, COPD and pulmonary sarcoidosis,                 Signs/Symptoms:Syncope; Risk Factors:Hypertension, Dyslipidemia                 and Non-Smoker. Patient has had 2 episodes of syncope in the                 last 3 months. She denies chest pain and leg edema. She does                 have increasing DOE these last few weeks with a chronic cough.  Sonographer:    Salvadore Dom RVT, RDCS (AE), RDMS Referring Phys: [phone] Felt  1. There is a mid LV cavitary dynamic gradient due to hyperdynamic LVF. Marland Kitchen Left ventricular ejection fraction, by estimation, is 60 to 65%. Left ventricular ejection fraction by 3D volume is 64 %. The left ventricle has normal function. The left ventricle has no regional wall motion abnormalities. Left ventricular diastolic parameters are consistent with Grade I diastolic dysfunction (impaired relaxation). The average left ventricular global longitudinal strain is -16.8 %. The global longitudinal strain is normal.  2. Right ventricular systolic function is normal. The right ventricular size is normal. There is normal pulmonary artery systolic pressure. The estimated right ventricular systolic pressure is 9.1 mmHg.  3. The pericardial effusion  is anterior to the right ventricle.  4. The mitral valve is normal in structure. No evidence of mitral valve regurgitation. No evidence of mitral stenosis.  5. The aortic valve is calcified. Aortic valve regurgitation is not visualized. Aortic valve sclerosis/calcification is present, without any evidence of aortic stenosis. Aortic valve area, by VTI measures 1.88 cm. Aortic valve mean gradient measures 5.3 mmHg. Aortic valve Vmax measures 1.64 m/s.  6. The inferior vena cava is normal in size with greater than 50% respiratory variability, suggesting right atrial pressure of  3 mmHg. Comparison(s): EF 60%, mild MAC, moderate aortic sclerosis with no evidence of stenosis, IAS lipomatous. 12/29/2020 CT Chest new small pericardial effusion noted. FINDINGS  Left Ventricle: There is a mid LV cavitary dynamic gradient due to hyperdynamic LVF. Left ventricular ejection fraction, by estimation, is 60 to 65%. Left ventricular ejection fraction by 3D volume is 64 %. The left ventricle has normal function. The left ventricle has no regional wall motion abnormalities. The average left ventricular global longitudinal strain is -16.8 %. The global longitudinal strain is normal. The left ventricular internal cavity size was normal in size. There is no left ventricular hypertrophy. Left ventricular diastolic parameters are consistent with Grade I diastolic dysfunction (impaired relaxation). Right Ventricle: The right ventricular size is normal. No increase in right ventricular wall thickness. Right ventricular systolic function is normal. There is normal pulmonary artery systolic pressure. The tricuspid regurgitant velocity is 1.23 m/s, and  with an assumed right atrial pressure of 3 mmHg, the estimated right ventricular systolic pressure is 9.1 mmHg. Left Atrium: Left atrial size was normal in size. Right Atrium: Right atrial size was normal in size. Pericardium: Trivial pericardial effusion is present. The pericardial effusion is anterior to the right ventricle. Mitral Valve: The mitral valve is normal in structure. Mild mitral annular calcification. No evidence of mitral valve regurgitation. No evidence of mitral valve stenosis. Tricuspid Valve: The tricuspid valve is normal in structure. Tricuspid valve regurgitation is not demonstrated. No evidence of tricuspid stenosis. Aortic Valve: The aortic valve is calcified. Aortic valve regurgitation is not visualized. Aortic valve sclerosis/calcification is present, without any evidence of aortic stenosis. Aortic valve mean gradient measures 5.3 mmHg.  Aortic valve peak gradient measures 10.7 mmHg. Aortic valve area, by VTI measures 1.88 cm. Pulmonic Valve: The pulmonic valve was normal in structure. Pulmonic valve regurgitation is mild. No evidence of pulmonic stenosis. Aorta: The aortic root is normal in size and structure. Venous: The inferior vena cava is normal in size with greater than 50% respiratory variability, suggesting right atrial pressure of 3 mmHg. IAS/Shunts: No atrial level shunt detected by color flow Doppler.  LEFT VENTRICLE PLAX 2D LVIDd:         3.54 cm         Diastology LVIDs:         2.26 cm         LV e' medial:    5.78 cm/s LV PW:         1.05 cm         LV E/e' medial:  13.2 LV IVS:        0.84 cm         LV e' lateral:   6.30 cm/s LVOT diam:     1.80 cm         LV E/e' lateral: 12.1 LV SV:         58 LV SV Index:   34  2D LVOT Area:     2.54 cm        Longitudinal                                Strain                                2D Strain GLS  -17.4 % LV Volumes (MOD)               (A2C): LV vol d, MOD    43.7 ml       2D Strain GLS  -14.0 % A2C:                           (A3C): LV vol d, MOD    45.3 ml       2D Strain GLS  -19.0 % A4C:                           (A4C): LV vol s, MOD    12.3 ml       2D Strain GLS  -16.8 % A2C:                           Avg: LV vol s, MOD    10.4 ml A4C:                           3D Volume EF LV SV MOD A2C:   31.4 ml       LV 3D EF:    Left LV SV MOD A4C:   45.3 ml                    ventricul LV SV MOD BP:    35.8 ml                    ar                                             ejection                                             fraction                                             by 3D                                             volume is                                             64 %.  3D Volume EF:                                3D EF:        64 %                                LV EDV:       78 ml                                LV ESV:       28  ml                                LV SV:        50 ml RIGHT VENTRICLE RV S prime:     13.60 cm/s TAPSE (M-mode): 1.9 cm LEFT ATRIUM             Index        RIGHT ATRIUM           Index LA diam:        3.70 cm 2.17 cm/m   RA Area:     13.90 cm LA Vol (A2C):   59.5 ml 34.97 ml/m  RA Volume:   37.70 ml  22.16 ml/m LA Vol (A4C):   30.6 ml 17.99 ml/m LA Biplane Vol: 43.0 ml 25.27 ml/m  AORTIC VALVE                     PULMONIC VALVE AV Area (Vmax):    1.74 cm      PV Vmax:          1.41 m/s AV Area (Vmean):   1.97 cm      PV Peak grad:     8.0 mmHg AV Area (VTI):     1.88 cm      PR End Diast Vel: 10.45 msec AV Vmax:           163.67 cm/s AV Vmean:          104.433 cm/s AV VTI:            0.307 m AV Peak Grad:      10.7 mmHg AV Mean Grad:      5.3 mmHg LVOT Vmax:         112.00 cm/s LVOT Vmean:        80.800 cm/s LVOT VTI:          0.227 m LVOT/AV VTI ratio: 0.74  AORTA Ao Root diam: 2.80 cm Ao Asc diam:  3.20 cm Ao Arch diam: 3.3 cm MITRAL VALVE                TRICUSPID VALVE MV Area (PHT): 2.64 cm     TR Peak grad:   6.1 mmHg MV Decel Time: 287 msec     TR Vmax:        123.00 cm/s MV E velocity: 76.50 cm/s MV A velocity: 108.00 cm/s  SHUNTS MV E/A ratio:  0.71         Systemic VTI:  0.23 m  Systemic Diam: 1.80 cm Fransico Him MD Electronically signed by Fransico Him MD Signature Date/Time: 02/16/2021/1:28:03 PM    Final    VAS US CAROTID  Result Date: 02/17/2021 Carotid Arterial Duplex Study Patient Name:  Kristen Suarez  Date of Exam:   02/16/2021 Medical Rec #: [id number]          Accession #:    [id number] Date of Birth: [birth year]          Patient Gender: F Patient Age:   89 years Exam Location:  [facility] Procedure:      VAS US CAROTID Referring Phys: Laurann Montana --------------------------------------------------------------------------------  Indications:       Syncope and Patient denies any cerebrovascular symptoms. Risk Factors:      Hypertension, hyperlipidemia, no  history of smoking, coronary                    artery disease. Comparison Study:  None Performing Technologist: Alecia Mackin RVT, RDCS (AE), RDMS  Examination Guidelines: A complete evaluation includes B-mode imaging, spectral Doppler, color Doppler, and power Doppler as needed of all accessible portions of each vessel. Bilateral testing is considered an integral part of a complete examination. Limited examinations for reoccurring indications may be performed as noted.  Right Carotid Findings: +----------+--------+--------+--------+-------------------+--------+             PSV cm/s EDV cm/s Stenosis Plaque Description  Comments  +----------+--------+--------+--------+-------------------+--------+  CCA Prox   88       12                                              +----------+--------+--------+--------+-------------------+--------+  CCA Distal 62       13                focal and calcific            +----------+--------+--------+--------+-------------------+--------+  ICA Prox   53       11                smooth and calcific           +----------+--------+--------+--------+-------------------+--------+  ICA Mid    53       19                                              +----------+--------+--------+--------+-------------------+--------+  ICA Distal 47       12                                              +----------+--------+--------+--------+-------------------+--------+  ECA        100      9                                               +----------+--------+--------+--------+-------------------+--------+ +----------+--------+-------+----------------+-------------------+             PSV cm/s EDV cms Describe         Arm Pressure (mmHG)  +----------+--------+-------+----------------+-------------------+  Subclavian 105  Multiphasic, WNL 134                  +----------+--------+-------+----------------+-------------------+ +---------+--------+--+--------+--+---------+  Vertebral PSV cm/s 47 EDV  cm/s 12 Antegrade  +---------+--------+--+--------+--+---------+  Left Carotid Findings: +----------+--------+--------+--------+--------------------------+--------+             PSV cm/s EDV cm/s Stenosis Plaque Description         Comments  +----------+--------+--------+--------+--------------------------+--------+  CCA Prox   102      14                                                     +----------+--------+--------+--------+--------------------------+--------+  CCA Distal 60       13                focal and calcific                   +----------+--------+--------+--------+--------------------------+--------+  ICA Prox   66       13                focal, calcific and smooth           +----------+--------+--------+--------+--------------------------+--------+  ICA Mid    67       18                                                     +----------+--------+--------+--------+--------------------------+--------+  ICA Distal 87       25                                                     +----------+--------+--------+--------+--------------------------+--------+  ECA        93       11                                                     +----------+--------+--------+--------+--------------------------+--------+ +----------+--------+--------+----------------+-------------------+             PSV cm/s EDV cm/s Describe         Arm Pressure (mmHG)  +----------+--------+--------+----------------+-------------------+  Subclavian 113               Multiphasic, WNL 134                  +----------+--------+--------+----------------+-------------------+ +---------+--------+--+--------+--+---------+  Vertebral PSV cm/s 49 EDV cm/s 11 Antegrade  +---------+--------+--+--------+--+---------+   Summary: Right Carotid: The extracranial vessels were near-normal with only minimal wall                thickening or plaque. Left Carotid: The extracranial vessels were near-normal with only minimal wall               thickening or plaque.  Vertebrals:  Bilateral vertebral arteries demonstrate antegrade flow. Subclavians: Normal flow hemodynamics were seen in bilateral subclavian              arteries. *See  table(s) above for measurements and observations.  Electronically signed by Larae Grooms MD on 02/17/2021 at 6:14:48 PM.    Final       PFT Results Latest Ref Rng & Units 12/30/2018  FVC-Pre L 1.48  FVC-Predicted Pre % 50  FVC-Post L 1.49  FVC-Predicted Post % 50  Pre FEV1/FVC % % 69  Post FEV1/FCV % % 69  FEV1-Pre L 1.03  FEV1-Predicted Pre % 46  FEV1-Post L 1.03  DLCO uncorrected ml/min/mmHg 11.77  DLCO UNC% % 60  DLVA Predicted % 111  TLC L 3.67  TLC % Predicted % 71  RV % Predicted % 91    No results found for: NITRICOXIDE      Assessment & Plan:   COPD (chronic obstructive pulmonary disease) (HCC) Continues to experience high symptom burden. Sarcoid vs COPD. Stable severe lung changes on CT chest and CXR. Schedule repeat PFTs. Prednisone taper. Discussed progression of COPD and breathlessness. Prev walking oximetry stable and without desaturations. Continue exercising regularly.   Patient Instructions  -Continue Breztri 2 puffs Twice daily, brush tongue and rinse mouth daily. Continue to use spacer -Continue albuterol 3 mL nebs every 6 hours as needed for shortness of breath or wheezing -Continue albuterol inhaler 2 puffs every 6 hours as needed for shortness of breath or wheezing -Continue Nasonex 2 sprays each nostril daily  -Continue Protonix 40 mg daily -Continue Saline nasal spray 2-3 times a day -Continue Zyrtec 10 mg daily -Continue pepcid as needed for GERD symptoms -Continue Mucinex DM for cough and congestion twice a day over the counter   -Prednisone taper pack. 4 tabs for 3 days, then 3 tabs for 3 days, 2 tabs for 3 days, then 1 tab for 3 days, then stop   Flutter valve throughout the day  Pulmonary function testing scheduled today.  Notify if worsening breathlessness, cough,  mucus production, fatigue, or wheezing occurs.  Maintain up to date vaccinations, including influenza, COVID, and pneumococcal.  Wash your hands often and avoid sick exposures.  Encouraged masking in crowds.  Avoid triggers, when possible.  Exercise, as tolerated. Notify if worsening symptoms upon exertion occur.     Follow up in with Dr. Lamonte Sakai, Roxan Diesel, NP, or APP after PFTs. If symptoms do not improve or worsen, please contact office for sooner follow up or seek emergency care.   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 No other systemic symptoms. ACE level today. PFTs to evaluate worsening restriction vs obstruction. See above.   Allergic rhinitis Continue current regimen with Zyrtec and Nasonex. See above.   GERD (gastroesophageal reflux disease) Well-controlled. Continue daily PPI. See above.      Clayton Bibles, NP 03/07/2021  Pt aware and understands NP's role.

## 2021-03-07 NOTE — Assessment & Plan Note (Addendum)
Continues to experience high symptom burden. Sarcoid vs COPD. Stable severe lung changes on CT chest and CXR. Schedule repeat PFTs. Prednisone taper. Discussed progression of COPD and breathlessness. Prev walking oximetry stable and without desaturations. Continue exercising regularly.   Patient Instructions  -Continue Breztri 2 puffs Twice daily, brush tongue and rinse mouth daily. Continue to use spacer -Continue albuterol 3 mL nebs every 6 hours as needed for shortness of breath or wheezing -Continue albuterol inhaler 2 puffs every 6 hours as needed for shortness of breath or wheezing -Continue Nasonex 2 sprays each nostril daily  -Continue Protonix 40 mg daily -Continue Saline nasal spray 2-3 times a day -Continue Zyrtec 10 mg daily -Continue pepcid as needed for GERD symptoms -Continue Mucinex DM for cough and congestion twice a day over the counter   -Prednisone taper pack. 4 tabs for 3 days, then 3 tabs for 3 days, 2 tabs for 3 days, then 1 tab for 3 days, then stop   Flutter valve throughout the day  Pulmonary function testing scheduled today.  Notify if worsening breathlessness, cough, mucus production, fatigue, or wheezing occurs.  Maintain up to date vaccinations, including influenza, COVID, and pneumococcal.  Wash your hands often and avoid sick exposures.  Encouraged masking in crowds.  Avoid triggers, when possible.  Exercise, as tolerated. Notify if worsening symptoms upon exertion occur.     Follow up in with Dr. Lamonte Sakai, Roxan Diesel, NP, or APP after PFTs. If symptoms do not improve or worsen, please contact office for sooner follow up or seek emergency care.

## 2021-03-07 NOTE — Assessment & Plan Note (Signed)
Well-controlled. Continue daily PPI. See above.

## 2021-03-07 NOTE — Patient Instructions (Addendum)
-  Continue Breztri 2 puffs Twice daily, brush tongue and rinse mouth daily. Continue to use spacer -Continue albuterol 3 mL nebs every 6 hours as needed for shortness of breath or wheezing -Continue albuterol inhaler 2 puffs every 6 hours as needed for shortness of breath or wheezing -Continue Nasonex 2 sprays each nostril daily  -Continue Protonix 40 mg daily -Continue Saline nasal spray 2-3 times a day -Continue Zyrtec 10 mg daily -Continue pepcid as needed for GERD symptoms -Continue Mucinex DM for cough and congestion twice a day over the counter   -Prednisone taper pack. 4 tabs for 3 days, then 3 tabs for 3 days, 2 tabs for 3 days, then 1 tab for 3 days, then stop   Flutter valve throughout the day  Pulmonary function testing scheduled today.  Notify if worsening breathlessness, cough, mucus production, fatigue, or wheezing occurs.  Maintain up to date vaccinations, including influenza, COVID, and pneumococcal.  Wash your hands often and avoid sick exposures.  Encouraged masking in crowds.  Avoid triggers, when possible.  Exercise, as tolerated. Notify if worsening symptoms upon exertion occur.     Follow up in with Dr. Lamonte Sakai, Roxan Diesel, NP, or APP after PFTs. If symptoms do not improve or worsen, please contact office for sooner follow up or seek emergency care.

## 2021-03-09 LAB — ANGIOTENSIN CONVERTING ENZYME: Angiotensin-Converting Enzyme: 46 U/L (ref 9–67)

## 2021-03-13 ENCOUNTER — Telehealth: Payer: Self-pay | Admitting: Nurse Practitioner

## 2021-03-13 ENCOUNTER — Encounter: Payer: Self-pay | Admitting: Emergency Medicine

## 2021-03-13 DIAGNOSIS — J441 Chronic obstructive pulmonary disease with (acute) exacerbation: Secondary | ICD-10-CM

## 2021-03-13 NOTE — Telephone Encounter (Signed)
Yes I am ok with this 

## 2021-03-13 NOTE — Telephone Encounter (Signed)
Katie's order has been deleted. New order placed under RB's name.   Nothing further needed.

## 2021-03-13 NOTE — Telephone Encounter (Signed)
RB, would you be ok with [place] ordering the ONO under your name instead?

## 2021-03-14 ENCOUNTER — Encounter: Payer: Self-pay | Admitting: Nurse Practitioner

## 2021-03-22 ENCOUNTER — Encounter: Payer: Self-pay | Admitting: Cardiovascular Disease

## 2021-03-24 ENCOUNTER — Other Ambulatory Visit: Payer: Self-pay | Admitting: Cardiovascular Disease

## 2021-03-24 ENCOUNTER — Other Ambulatory Visit: Payer: Self-pay | Admitting: Nurse Practitioner

## 2021-03-24 DIAGNOSIS — R059 Cough, unspecified: Secondary | ICD-10-CM

## 2021-03-24 DIAGNOSIS — R0982 Postnasal drip: Secondary | ICD-10-CM

## 2021-03-26 NOTE — Telephone Encounter (Signed)
Refill request

## 2021-03-28 ENCOUNTER — Other Ambulatory Visit: Payer: Self-pay | Admitting: Emergency Medicine

## 2021-03-28 NOTE — Progress Notes (Signed)
[facility] Follow-up:    Date:  03/29/2021   ID:  Kristen Suarez, DOB [birth year], MRN [id number]  PCP:  Flossie Buffy, NP  Cardiologist:  Skeet Latch, MD  Nephrologist:  Referring MD: Flossie Buffy, NP   CC: Hypertension  History of Present Illness:    Kristen Suarez is a 76 y.o. female with a hx of coronary artery disease, hypertension, hyperlipidemia, pulmonary sarcoidosis, and asthma here for follow-up. She initially established care in the [facility] 08/15/2020.  She was first diagnosed with hypertension 20 years ago.  Recently she was in pulmonary rehab and the nurses were alarmed that her BP was running so high.  Her BP increased to 200 when walking.  It remains 140-160/70-80s.  Kristen Suarez has been working with Wilfred Lacy, NP on her blood pressure control.  Most recently hydralazine was added to her regimen of amlodipine and irbesartan.  Her home blood pressure readings remained elevated though her office readings seem to have improved.  She was referred to advanced hypertension clinic for further management.  Her BP hasn't changed since adding hydralazine.  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  Prior to this she was exercising regularly. She reported orthopnea which is chronic and she attributes to her underlying lung disease.  She did have COVID in 2021 and notes that she had been more short of breath since that time.    Kristen Suarez underwent stenting of the right renal artery 11/29/2020. A few days later she developed symptoms of pre-syncope and hypotension. She presented to the ED 01/03/2021 following a syncopal episode during a chair yoga class. There was a prodrome of dizziness, and she eased herself off of the chair prior to losing conciousness. Afterwards she felt fatigue and nausea. She decreased amlodipine to 5 mg and Irbesartan to 150 mg. She followed up with Kristen Montana, NP on 01/22/2021 and  was feeling well. Given her recurrent syncope, she had an Echo 02/2021 which showed LVEF 60-65% with grade 1 diastolic dysfunction. She had a small pericardial effusion. She had carotid dopplers with minimal thickening bilaterally.    Overall, she is feeling pretty well. She notes her blood pressure is still high. A few weeks ago she began monitoring her blood pressure before and after exercise. Within 2 hours of taking Irbesartan in the AM, her blood pressure is typically in the 120s-130s with occasional 140s. During mid-day her BP averages 130s-140s, and in the evenings it averages 130s-140s and sometimes higher. She is taking 1 tablet of 10 mg Amlodipine at 6 pm. Typically she takes her readings within 1-2 hours of taking her medication. She reports nearly passing out a week following her syncopal episode 12/2020 while she was in meditation. During the second episode she was able to lie down and prevent losing consciousness. She affirms eating and drinking well prior to these episodes. Usually she does wear compression socks. During exercise she is mostly feeling okay. She is working with a Clinical research associate. Her exercise progress has been set back somewhat due to health issues including skin cancer and becoming short of breath quickly. Since her renal artery stent placement she has noticed occasional epistaxis which she attributes to taking Plavix. Two days ago she took her final Plavix. Of note, her loop recorder battery stopped functioning sometime in 12/2020. She denies any palpitations, or chest pain. No lightheadedness, headaches, PND, or lower extremity edema.  Previous antihypertensives: Unsure   Past Medical  History:  Diagnosis Date   Arthritis    Asthma    CAD in native artery 08/15/2020   Chickenpox    Colon polyps 2010   Epistaxis 03/29/2021   Heart murmur    History of fainting spells of unknown cause    Hyperlipidemia    Hypertension    Sarcoid 06/16/1976    Past Surgical History:   Procedure Laterality Date   ABDOMINAL HYSTERECTOMY  1987   BASAL CELL CARCINOMA EXCISION  06/30/2020   nose   LUNG BIOPSY  06/16/1976   PERIPHERAL VASCULAR INTERVENTION Right 11/29/2020   Procedure: PERIPHERAL VASCULAR INTERVENTION;  Surgeon: Wellington Hampshire, MD;  Location: [facility];  Service: Cardiovascular;  Laterality: Right;  Renal Artery   RENAL ANGIOGRAPHY N/A 11/29/2020   Procedure: RENAL ANGIOGRAPHY;  Surgeon: Wellington Hampshire, MD;  Location: [facility];  Service: Cardiovascular;  Laterality: N/A;    Current Medications: Current Meds  Medication Sig   acetaminophen (TYLENOL) 500 MG tablet Take 500 mg by mouth every 6 (six) hours as needed for moderate pain.   albuterol (PROVENTIL) (2.5 MG/3ML) 0.083% nebulizer solution Take 3 mLs (2.5 mg total) by nebulization every 6 (six) hours as needed for wheezing or shortness of breath. USE 1 VIAL VIA NEBULIZER EVERY 6 HOURS Strength: (2.5 MG/3ML) 0.083%   albuterol (VENTOLIN HFA) 108 (90 Base) MCG/ACT inhaler Inhale 2 puffs into the lungs every 6 (six) hours as needed for wheezing or shortness of breath.   amLODipine (NORVASC) 10 MG tablet Take 10 mg by mouth daily. AT 6 PM   aspirin EC 81 MG tablet Take 1 tablet (81 mg total) by mouth daily. Swallow whole.   Budeson-Glycopyrrol-Formoterol (BREZTRI AEROSPHERE) 160-9-4.8 MCG/ACT AERO Inhale 2 puffs into the lungs in the morning and at bedtime.   Calcium Carb-Cholecalciferol (CALCIUM 600 + D PO) Take 1 tablet by mouth daily.   cetirizine (ZYRTEC) 10 MG tablet Take 10 mg by mouth daily as needed for allergies.   COVID-19 mRNA bivalent vaccine, Pfizer, injection Inject into the muscle.   docusate sodium (COLACE) 100 MG capsule Take 100 mg by mouth daily.    famotidine (PEPCID) 10 MG tablet Take 10 mg by mouth daily as needed for heartburn.   influenza vaccine adjuvanted (FLUAD) 0.5 ML injection Inject into the muscle.   mometasone (NASONEX) 50 MCG/ACT nasal spray SHAKE LIQUID  AND USE 2 SPRAYS IN EACH NOSTRIL DAILY   Multiple Vitamins-Minerals (MULTIVITAL PO) Take 1 tablet by mouth daily.    PREVIDENT 5000 BOOSTER PLUS 1.1 % PSTE Place 1 application onto teeth 3 (three) times a week.   Probiotic Product (PROBIOTIC DAILY PO) Take 1 tablet by mouth daily.    sodium chloride (OCEAN) 0.65 % SOLN nasal spray Place 1 spray into both nostrils as needed for congestion.   [DISCONTINUED] amLODipine (NORVASC) 5 MG tablet Take 2 tablets (10 mg total) by mouth at bedtime.   [DISCONTINUED] irbesartan (AVAPRO) 150 MG tablet Take 1 tablet (150 mg total) by mouth daily.   [DISCONTINUED] pantoprazole (PROTONIX) 40 MG tablet TAKE 1 TABLET(40 MG) BY MOUTH DAILY     Allergies:   Penicillins, Sulfa antibiotics, and Keflex [cephalexin]   Social History   Socioeconomic History   Marital status: Married    Spouse name: Not on file   Number of children: Not on file   Years of education: Not on file   Highest education level: Not on file  Occupational History   Occupation: retired  Tobacco Use   Smoking status: Never   Smokeless tobacco: Never  Vaping Use   Vaping Use: Never used  Substance and Sexual Activity   Alcohol use: Yes    Comment: once in a while   Drug use: Never   Sexual activity: Not Currently  Other Topics Concern   Not on file  Social History Narrative   Not on file   Social Determinants of Health   Financial Resource Strain: Low Risk    Difficulty of Paying Living Expenses: Not hard at all  Food Insecurity: No Food Insecurity   Worried About Charity fundraiser in the Last Year: Never true   [place] in the Last Year: Never true  Transportation Needs: No Transportation Needs   Lack of Transportation (Medical): No   Lack of Transportation (Non-Medical): No  Physical Activity: Insufficiently Active   Days of Exercise per Week: 2 days   Minutes of Exercise per Session: 20 min  Stress: No Stress Concern Present   Feeling of Stress : Not at all   Social Connections: Moderately Isolated   Frequency of Communication with Friends and Family: Twice a week   Frequency of Social Gatherings with Friends and Family: Twice a week   Attends Religious Services: Never   Marine scientist or Organizations: No   Attends Music therapist: Never   Marital Status: Married     Family History: The patient's family history includes Alcohol abuse in her brother, brother, father, and sister; Arthritis in her mother; Cancer in her mother; Depression in her mother; Early death in her brother; Heart attack in her brother and mother; Heart disease in her brother, brother, father, maternal grandfather, paternal grandfather, and sister; Hyperlipidemia in her brother, brother, father, mother, sister, sister, and son; Hypertension in her brother, father, mother, and sister; Multiple sclerosis in her daughter; Stroke in her sister.  ROS:   Please see the history of present illness.    (+) Syncope, Near-syncope (+) Exertional shortness of breath (+) Fatigue (+) Epistaxis All other systems reviewed and are negative.  EKGs/Labs/Other Studies Reviewed:    Bilateral Carotid Duplex 02/16/2021: Summary:  Right Carotid: The extracranial vessels were near-normal with only minimal wall thickening or plaque.   Left Carotid: The extracranial vessels were near-normal with only minimal  wall thickening or plaque.   Vertebrals:  Bilateral vertebral arteries demonstrate antegrade flow.  Subclavians: Normal flow hemodynamics were seen in bilateral subclavian arteries.   Echo 02/16/2021:  1. There is a mid LV cavitary dynamic gradient due to hyperdynamic LVF.  Left ventricular ejection fraction, by estimation, is 60 to 65%. Left  ventricular ejection fraction by 3D volume is 64 %. The left ventricle has  normal function. The left  ventricle has no regional wall motion abnormalities. Left ventricular  diastolic parameters are consistent with Grade I  diastolic dysfunction  (impaired relaxation). The average left ventricular global longitudinal  strain is -16.8 %. The global  longitudinal strain is normal.   2. Right ventricular systolic function is normal. The right ventricular  size is normal. There is normal pulmonary artery systolic pressure. The  estimated right ventricular systolic pressure is 9.1 mmHg.   3. The pericardial effusion is anterior to the right ventricle.   4. The mitral valve is normal in structure. No evidence of mitral valve  regurgitation. No evidence of mitral stenosis.   5. The aortic valve is calcified. Aortic valve regurgitation is not  visualized. Aortic valve  sclerosis/calcification is present, without any  evidence of aortic stenosis. Aortic valve area, by VTI measures 1.88 cm.  Aortic valve mean gradient measures  5.3 mmHg. Aortic valve Vmax measures 1.64 m/s.   6. The inferior vena cava is normal in size with greater than 50%  respiratory variability, suggesting right atrial pressure of 3 mmHg.   Comparison(s): EF 60%, mild MAC, moderate aortic sclerosis with no  evidence of stenosis, IAS lipomatous.   12/29/2020 CT Chest new small pericardial effusion noted.   CT Chest 12/29/2020: COMPARISON:  12/28/2019.   FINDINGS: Cardiovascular: Atherosclerotic calcification of the aorta, aortic valve and coronary arteries. Heart size normal. Small pericardial effusion, new.   Mediastinum/Nodes: Noncalcified mediastinal lymph nodes measure up to 10 mm in the low right paratracheal station, unchanged. Calcified mediastinal and hilar lymph nodes. Hilar regions are otherwise difficult to evaluate without IV contrast. No axillary adenopathy. Esophagus is grossly unremarkable.   Lungs/Pleura: Emphysema. Extensive upper and midlung zone predominant central pulmonary retraction, traction bronchiectasis and architectural distortion, as before. Additional areas of peribronchovascular ground-glass and nodularity,  also similar. No pleural fluid. Airway is otherwise unremarkable.   Upper Abdomen: Visualized portions of the liver, gallbladder, adrenal glands and right kidney are unremarkable. Atrophic left kidney with a subcentimeter low-attenuation lesion in the upper pole, too small to characterize. Visualized portions of the spleen, pancreas, stomach and bowel are grossly unremarkable. A stent is seen in the right renal artery. Abdominal retroperitoneal lymph nodes measure up to 10 mm in the left periaortic station, unchanged. Calcified gastrohepatic ligament lymph node.   Musculoskeletal: There are scattered hemangiomas in the spine. No worrisome lytic or sclerotic lesions.   IMPRESSION: 1. Mediastinal, hilar and pulmonary parenchymal findings of sarcoid, unchanged from 12/28/2019. 2. New small pericardial effusion. 3. Aortic atherosclerosis (ICD10-I70.0). Coronary artery calcification. 4.  Emphysema (ICD10-J43.9).  Renal Angiography 11/29/2020: 1.  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 2.  Successful angioplasty and stent placement to the right renal artery.   Recommendations: Dual antiplatelet therapy for few months. Monitor blood pressure closely and adjust antihypertensive medications as needed.  EKG:   03/29/2021: EKG was not ordered. 08/15/2020: sinus rhythm rate 85 bpm  Recent Labs: 01/03/2021: Hemoglobin 12.8; Platelets 227 01/29/2021: ALT 17; BUN 24; Creatinine, Ser 1.26; Potassium 3.6; Sodium 138   Recent Lipid Panel    Component Value Date/Time   CHOL 177 01/29/2021 0930   CHOL 209 (H) 11/21/2020 0918   TRIG 164.0 (H) 01/29/2021 0930   HDL 69.80 01/29/2021 0930   HDL 50 11/21/2020 0918   CHOLHDL 3 01/29/2021 0930   VLDL 32.8 01/29/2021 0930   LDLCALC 74 01/29/2021 0930   LDLCALC 128 (H) 11/21/2020 0918    Physical Exam:    VS:  BP 130/70    Pulse 80    Ht _0  (1.6 m)    Wt 141 lb (64 kg)    SpO2 98%     BMI 24.98 kg/m  , BMI Body mass index is 24.98 kg/m. GENERAL:  Well appearing HEENT: Pupils equal round and reactive, fundi not visualized, oral mucosa unremarkable NECK:  No jugular venous distention, waveform within normal limits, carotid upstroke brisk and symmetric, no bruits LUNGS:  CTAB.  No crackles, rhonchi or wheezes.  HEART:  RRR.  PMI not displaced or sustained,S1 and S2 within normal limits, no S3, no S4, no clicks, no rubs, no murmurs ABD:  Flat, positive bowel sounds normal in frequency in  pitch, no bruits, no rebound, no guarding, no midline pulsatile mass, no hepatomegaly, no splenomegaly EXT:  2 plus pulses throughout, no edema, no cyanosis no clubbing SKIN:  No rashes no nodules NEURO:  Cranial nerves II through XII grossly intact, motor grossly intact throughout PSYCH:  Cognitively intact, oriented to person place and time   ASSESSMENT/PLAN:    HTN (hypertension) Blood pressure remains poorly controlled.  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  Her blood pressures tend to rise in the evening.  She will continue taking amlodipine and start taking irbesartan 150 mg twice daily.  Check a basic metabolic panel in a week.  She was congratulated on her regular exercise regimen.  Epistaxis She has had epistaxis repeatedly since her renal artery stenting.  She just stopped clopidogrel 2 days ago.  Hopefully this should help resolve the issue.  She will continue using the saline nasal spray.  Sarcoidosis She has pulmonary sarcoidosis.  She was given a letter for jury duty.  Given her underlying lung condition, she is at high risk for infection with COVID-19 and should not shortness of breath injury with people who do not wear a mask.   Screening for Secondary Hypertension:  Causes 08/15/2020  Drugs/Herbals Screened     - Comments no issues  Renovascular HTN Screened     - Comments check renal Dopplers  Sleep Apnea Screened     -  Comments no triggers    Relevant Labs/Studies: Basic Labs Latest Ref Rng & Units 01/29/2021 01/03/2021 12/19/2020  Sodium 135 - 145 mEq/L 138 136 138  Potassium 3.5 - 5.1 mEq/L 3.6 3.6 4.5  Creatinine 0.40 - 1.20 mg/dL 1.26(H) 1.14(H) 1.28(H)    Thyroid  Latest Ref Rng & Units 02/29/2020 04/21/2019  TSH 0.35 - 4.50 uIU/mL 2.32 1.87             Renovascular  12/06/2020  Renal Artery [place] Completed Yes      Disposition: FU with APP in 2 months. FU with Sojourner Behringer C. Oval Linsey, MD, [facility] in 6 months.   Medication Adjustments/Labs and Tests Ordered: Current medicines are reviewed at length with the patient today.  Concerns regarding medicines are outlined above.   Orders Placed This Encounter  Procedures   Basic metabolic panel   Meds ordered this encounter  Medications   irbesartan (AVAPRO) 150 MG tablet    Sig: Take 1 tablet (150 mg total) by mouth 2 (two) times daily.    Dispense:  60 tablet    Refill:  5    NEW DOSE, D/C PREVIOUS RX   I,Mathew Stumpf,acting as a scribe for Skeet Latch, MD.,have documented all relevant documentation on the behalf of Skeet Latch, MD,as directed by  Skeet Latch, MD while in the presence of Skeet Latch, MD.  I, [place] Oval Linsey, MD have reviewed all documentation for this visit.  The documentation of the exam, diagnosis, procedures, and orders on 03/29/2021 are all accurate and complete.   Signed, Skeet Latch, MD  03/29/2021 10:53 AM    [place]

## 2021-03-29 ENCOUNTER — Encounter (HOSPITAL_BASED_OUTPATIENT_CLINIC_OR_DEPARTMENT_OTHER): Payer: Self-pay | Admitting: *Deleted

## 2021-03-29 ENCOUNTER — Other Ambulatory Visit: Payer: Self-pay

## 2021-03-29 ENCOUNTER — Encounter (HOSPITAL_BASED_OUTPATIENT_CLINIC_OR_DEPARTMENT_OTHER): Payer: Self-pay | Admitting: Cardiovascular Disease

## 2021-03-29 ENCOUNTER — Ambulatory Visit (INDEPENDENT_AMBULATORY_CARE_PROVIDER_SITE_OTHER): Payer: Medicare Other | Admitting: Cardiovascular Disease

## 2021-03-29 VITALS — BP 130/70 | HR 80 | Ht 63.0 in | Wt 141.0 lb

## 2021-03-29 DIAGNOSIS — Z5181 Encounter for therapeutic drug level monitoring: Secondary | ICD-10-CM | POA: Diagnosis not present

## 2021-03-29 DIAGNOSIS — I150 Renovascular hypertension: Secondary | ICD-10-CM

## 2021-03-29 DIAGNOSIS — R040 Epistaxis: Secondary | ICD-10-CM

## 2021-03-29 DIAGNOSIS — I10 Essential (primary) hypertension: Secondary | ICD-10-CM | POA: Diagnosis not present

## 2021-03-29 HISTORY — DX: Epistaxis: R04.0

## 2021-03-29 MED ORDER — IRBESARTAN 150 MG PO TABS: 150.0000 mg | ORAL_TABLET | Freq: Two times a day (BID) | ORAL | 5 refills | Status: DC

## 2021-03-29 NOTE — Patient Instructions (Addendum)
Medication Instructions:  INCREASE YOUR START IRBESARTAN TWICE A DAY   *If you need a refill on your cardiac medications before your next appointment, please call your pharmacy*  Lab Work: BMET IN 1 WEEK   If you have labs (blood work) drawn today and your tests are completely normal, you will receive your results only by: [place] (if you have MyChart) OR A paper copy in the mail If you have any lab test that is abnormal or we need to change your treatment, we will call you to review the results.  Testing/Procedures: NONE  Follow-Up: At [facility], you and your health needs are our priority.  As part of our continuing mission to provide you with exceptional heart care, we have created designated Provider Care Teams.  These Care Teams include your primary Cardiologist (physician) and Advanced Practice Providers (APPs -  Physician Assistants and Nurse Practitioners) who all work together to provide you with the care you need, when you need it.  We recommend signing up for the patient portal called "MyChart".  Sign up information is provided on this After Visit Summary.  MyChart is used to connect with patients for Virtual Visits (Telemedicine).  Patients are able to view lab/test results, encounter notes, upcoming appointments, etc.  Non-urgent messages can be sent to your provider as well.   To learn more about what you can do with MyChart, go to [email or web address].    Your next appointment:   6 month(s)  The format for your next appointment:   In Person  Provider:   Skeet Latch, MD  Your physician recommends that you schedule a follow-up appointment in: [place] NP

## 2021-03-29 NOTE — Assessment & Plan Note (Signed)
She has pulmonary sarcoidosis.  She was given a letter for jury duty.  Given her underlying lung condition, she is at high risk for infection with COVID-19 and should not shortness of breath injury with people who do not wear a mask.

## 2021-03-29 NOTE — Assessment & Plan Note (Signed)
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  She just stopped clopidogrel 2 days ago.  Hopefully this should help resolve the issue.  She will continue using the saline nasal spray.

## 2021-03-29 NOTE — Assessment & Plan Note (Signed)
Blood pressure remains poorly controlled.  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  Her blood pressures tend to rise in the evening.  She will continue taking amlodipine and start taking irbesartan 150 mg twice daily.  Check a basic metabolic panel in a week.  She was congratulated on her regular exercise regimen.

## 2021-04-06 LAB — BASIC METABOLIC PANEL
BUN/Creatinine Ratio: 14 (ref 12–28)
BUN: 17 mg/dL (ref 8–27)
CO2: 25 mmol/L (ref 20–29)
Calcium: 9.5 mg/dL (ref 8.7–10.3)
Chloride: 100 mmol/L (ref 96–106)
Creatinine, Ser: 1.25 mg/dL — ABNORMAL HIGH (ref 0.57–1.00)
Glucose: 107 mg/dL — ABNORMAL HIGH (ref 70–99)
Potassium: 3.9 mmol/L (ref 3.5–5.2)
Sodium: 142 mmol/L (ref 134–144)
eGFR: 45 mL/min/{1.73_m2} — ABNORMAL LOW (ref >59)

## 2021-04-09 ENCOUNTER — Other Ambulatory Visit: Payer: Self-pay

## 2021-04-09 ENCOUNTER — Ambulatory Visit (INDEPENDENT_AMBULATORY_CARE_PROVIDER_SITE_OTHER): Payer: Medicare Other | Admitting: Emergency Medicine

## 2021-04-09 ENCOUNTER — Ambulatory Visit: Payer: Medicare Other | Admitting: Nurse Practitioner

## 2021-04-09 DIAGNOSIS — D869 Sarcoidosis, unspecified: Secondary | ICD-10-CM

## 2021-04-09 DIAGNOSIS — J449 Chronic obstructive pulmonary disease, unspecified: Secondary | ICD-10-CM

## 2021-04-09 LAB — PULMONARY FUNCTION TEST
DL/VA % pred: 89 %
DL/VA: 3.71 ml/min/mmHg/L
DLCO cor % pred: 52 %
DLCO cor: 9.73 ml/min/mmHg
DLCO unc % pred: 52 %
DLCO unc: 9.73 ml/min/mmHg
FEF 25-75 Post: 0.47 L/sec
FEF 25-75 Pre: 0.57 L/sec
FEF2575-%Change-Post: -18 %
FEF2575-%Pred-Post: 29 %
FEF2575-%Pred-Pre: 35 %
FEV1-%Change-Post: -4 %
FEV1-%Pred-Post: 47 %
FEV1-%Pred-Pre: 49 %
FEV1-Post: 0.96 L
FEV1-Pre: 1 L
FEV1FVC-%Change-Post: 2 %
FEV1FVC-%Pred-Pre: 88 %
FEV6-%Change-Post: -5 %
FEV6-%Pred-Post: 55 %
FEV6-%Pred-Pre: 58 %
FEV6-Post: 1.42 L
FEV6-Pre: 1.5 L
FEV6FVC-%Change-Post: 0 %
FEV6FVC-%Pred-Post: 105 %
FEV6FVC-%Pred-Pre: 104 %
FVC-%Change-Post: -6 %
FVC-%Pred-Post: 52 %
FVC-%Pred-Pre: 56 %
FVC-Post: 1.42 L
FVC-Pre: 1.51 L
Post FEV1/FVC ratio: 68 %
Post FEV6/FVC ratio: 100 %
Pre FEV1/FVC ratio: 66 %
Pre FEV6/FVC Ratio: 100 %
RV % pred: 98 %
RV: 2.21 L
TLC % pred: 73 %
TLC: 3.62 L

## 2021-04-09 NOTE — Progress Notes (Signed)
Full PFT completed today ? ?

## 2021-04-09 NOTE — Telephone Encounter (Signed)
Called patient.  She states having back pain in upper back across shoulder blades and upper spine.  Has been using acetaminophen and aspercream with varying benefit.   Explained concerns about increased risk of thromboembolic events with NSAID's and how can we balance this against chronic pain.  Suggested that she can take ibuprofen 600 mg bid x 2 days.  After that she should try ice packs alternating with heat, as well as consider Voltaren gel or capsaicin ointment.  She is scheduled to see rheumatology in March and will follow up with them.

## 2021-04-11 ENCOUNTER — Ambulatory Visit
Admission: RE | Admit: 2021-04-11 | Discharge: 2021-04-11 | Disposition: A | Discharge status: Home / Self Care | Payer: Medicare Other | Source: Ambulatory Visit | Attending: Nurse Practitioner | Admitting: Nurse Practitioner

## 2021-04-11 DIAGNOSIS — Z1231 Encounter for screening mammogram for malignant neoplasm of breast: Secondary | ICD-10-CM

## 2021-04-12 ENCOUNTER — Encounter: Payer: Self-pay | Admitting: Nurse Practitioner

## 2021-04-12 ENCOUNTER — Telehealth: Payer: Self-pay | Admitting: *Deleted

## 2021-04-12 ENCOUNTER — Other Ambulatory Visit: Payer: Self-pay

## 2021-04-12 ENCOUNTER — Ambulatory Visit (INDEPENDENT_AMBULATORY_CARE_PROVIDER_SITE_OTHER): Payer: Medicare Other | Admitting: Nurse Practitioner

## 2021-04-12 VITALS — BP 140/78 | HR 90 | Temp 98.5°F | Ht 63.0 in | Wt 145.0 lb

## 2021-04-12 DIAGNOSIS — J42 Unspecified chronic bronchitis: Secondary | ICD-10-CM | POA: Diagnosis not present

## 2021-04-12 DIAGNOSIS — R0683 Snoring: Secondary | ICD-10-CM

## 2021-04-12 DIAGNOSIS — G8929 Other chronic pain: Secondary | ICD-10-CM

## 2021-04-12 DIAGNOSIS — D869 Sarcoidosis, unspecified: Secondary | ICD-10-CM

## 2021-04-12 DIAGNOSIS — K219 Gastro-esophageal reflux disease without esophagitis: Secondary | ICD-10-CM

## 2021-04-12 DIAGNOSIS — J301 Allergic rhinitis due to pollen: Secondary | ICD-10-CM

## 2021-04-12 DIAGNOSIS — M898X1 Other specified disorders of bone, shoulder: Secondary | ICD-10-CM

## 2021-04-12 MED ORDER — BREZTRI AEROSPHERE 160-9-4.8 MCG/ACT IN AERO: 2.0000 | INHALATION_SPRAY | Freq: Two times a day (BID) | RESPIRATORY_TRACT | 2 refills | Status: DC

## 2021-04-12 NOTE — Progress Notes (Addendum)
@Patient  ID: Kristen Suarez, female    DOB: May 03, 1945, 76 y.o.   MRN: [id number]  Chief Complaint  Patient presents with   Follow-up    Referring provider: Flossie Buffy, NP  HPI: 76 year old female, never smoker followed for sarcoidosis and associated severe obstructive lung disease with a component of restrictive lung disease as well.  She has a chronic cough, allergic rhinitis and GERD.  She is a patient Dr. Agustina Caroli and was last seen in office on 03/07/2021 by [facility] NP.  Past medical history significant for hypertension and CAD in native artery, HLD.  TEST/EVENTS:  12/30/2018 PFTs: FVC 1.49 (50), FEV1 1.03 (46), ratio 69, TLC 3.67 (71), DLCO uncorrected 60 12/29/2020 CT chest: Unchanged mediastinal, hilar adenopathy, unchanged extensive mid lobe predominant traction bronchiectasis and architectural distortion.  New small pericardial effusion.  Emphysematous change 02/05/2021 CXR 2 view: Bilateral upper lobe volume loss with superior retraction of the hila particularly on the left.  Bilateral upper lobe scarring with calcified granuloma and left upper lobe consistent with history of sarcoidosis.  Appearance of the lungs is grossly unchanged. 02/16/2021 echocardiogram: LVEF 60 to 65%.  G1 DD.  Trivial pericardial effusion 03/13/2021 ONO room air: duration 4 hr 23 min with 5 seconds <88%. Total events 108; Av ODI 24 events/hr. Basal SpO2 94.6% 04/09/2021 PFTs: FVC 1.51 (56), FEV1 1 (49), ratio 68, TLC 73%, DLCO uncorrected 52%.  No BD.  02/05/2021: OV with Hymie Gorr NP.  Recent URI with increased shortness of breath and cough.  Previous CT chest with extensive scarring but stable.  CXR at this visit showed severe chronic lung changes consistent with sarcoid, but unchanged from prior exam.  Prednisone taper, flutter valve and Mucinex.  03/07/2021: OV with Al Gagen NP for follow-up.  Continues to experience persistent shortness of breath upon exertion; unable to complete 15 minutes of exercise  without rest.  Productive cough with clear to white sputum.  ACE level normal.  PFTs ordered which was relatively unchanged when compared to 2020 aside from 8% decrease in diffusion capacity.  Prednisone taper pack.  Continued Breztri.  04/12/2021: Today - follow up Patient presents today for one month follow up. She reports feeling much better and notes that her cough and shortness of breath have improved. She still has some shortness of breath with exertion but is able to complete activities of daily living without difficulties. She is also able to exercise again for longer duration. She denies wheezing, orthopnea, PND, chest pain or lower extremity swelling. She denies any skin or eye problems. She does note having some chronic upper back/scapula pain which developed over the summer after she was exercising a lot. Her pain will subside with rest and then returns with activity. She describes it as piercing. She was advised by cardiology to avoid NSAIDs so she has been using topical analgesics and tylenol. She hs discussed this with her PCP and is to follow up with rheumatology in March. She continues on [place] Twice daily and is only using her rescue inhaler about once a week.   Her ONO was completed since her last visit  which did not show any oxygen requirements; however, her average ODI was 24/hr and advised follow up sleep study. She does report that her husband says she snores sometimes and she has been having ongoing daytime fatigue symptoms for the past six months. She denies any morning headaches, drowsy driving or narcolepsy. Overall, she feels better and offers no further complaints.   Allergies  Allergen  Reactions   Penicillins Itching   Sulfa Antibiotics Itching   Keflex [Cephalexin] Rash    Immunization History  Administered Date(s) Administered   Fluad Quad(high Dose 65+) 11/19/2018, 12/22/2020   Influenza, High Dose Seasonal PF 12/11/2017   Influenza-Unspecified 11/24/2019,  11/24/2019   PFIZER Comirnaty(Gray Top)Covid-19 Tri-Sucrose Vaccine 06/27/2020   PFIZER(Purple Top)SARS-COV-2 Vaccination 05/14/2019, 06/08/2019, 12/20/2019   Pfizer Covid-19 Vaccine Bivalent Booster 66yrs & up 12/14/2020   Pneumococcal Conjugate-13 03/23/2014   Pneumococcal Polysaccharide-23 03/24/2012   Tdap 03/24/2012   Zoster Recombinat (Shingrix) 01/29/2021    Past Medical History:  Diagnosis Date   Arthritis    Asthma    CAD in native artery 08/15/2020   Chickenpox    Colon polyps 2010   Epistaxis 03/29/2021   Heart murmur    History of fainting spells of unknown cause    Hyperlipidemia    Hypertension    Sarcoid 06/16/1976    Tobacco History: Social History   Tobacco Use  Smoking Status Never  Smokeless Tobacco Never   Counseling given: Not Answered   Outpatient Medications Prior to Visit  Medication Sig Dispense Refill   acetaminophen (TYLENOL) 500 MG tablet Take 500 mg by mouth every 6 (six) hours as needed for moderate pain.     albuterol (PROVENTIL) (2.5 MG/3ML) 0.083% nebulizer solution Take 3 mLs (2.5 mg total) by nebulization every 6 (six) hours as needed for wheezing or shortness of breath. USE 1 VIAL VIA NEBULIZER EVERY 6 HOURS Strength: (2.5 MG/3ML) 0.083% 150 mL 5   albuterol (VENTOLIN HFA) 108 (90 Base) MCG/ACT inhaler Inhale 2 puffs into the lungs every 6 (six) hours as needed for wheezing or shortness of breath. 8 g 6   amLODipine (NORVASC) 10 MG tablet Take 10 mg by mouth daily. AT 6 PM     aspirin EC 81 MG tablet Take 1 tablet (81 mg total) by mouth daily. Swallow whole. 150 tablet 2   Calcium Carb-Cholecalciferol (CALCIUM 600 + D PO) Take 1 tablet by mouth daily.     cetirizine (ZYRTEC) 10 MG tablet Take 10 mg by mouth daily as needed for allergies.     COVID-19 mRNA bivalent vaccine, Pfizer, injection Inject into the muscle. 0.3 mL 0   docusate sodium (COLACE) 100 MG capsule Take 100 mg by mouth daily.      famotidine (PEPCID) 10 MG tablet Take 10  mg by mouth daily as needed for heartburn.     influenza vaccine adjuvanted (FLUAD) 0.5 ML injection Inject into the muscle. 0.5 mL 0   irbesartan (AVAPRO) 150 MG tablet Take 1 tablet (150 mg total) by mouth 2 (two) times daily. 60 tablet 5   mometasone (NASONEX) 50 MCG/ACT nasal spray SHAKE LIQUID AND USE 2 SPRAYS IN EACH NOSTRIL DAILY 17 each 0   Multiple Vitamins-Minerals (MULTIVITAL PO) Take 1 tablet by mouth daily.      pantoprazole (PROTONIX) 40 MG tablet TAKE 1 TABLET BY MOUTH EVERY DAY 30 tablet 4   PREVIDENT 5000 BOOSTER PLUS 1.1 % PSTE Place 1 application onto teeth 3 (three) times a week.     Probiotic Product (PROBIOTIC DAILY PO) Take 1 tablet by mouth daily.      sodium chloride (OCEAN) 0.65 % SOLN nasal spray Place 1 spray into both nostrils as needed for congestion.     Budeson-Glycopyrrol-Formoterol (BREZTRI AEROSPHERE) 160-9-4.8 MCG/ACT AERO Inhale 2 puffs into the lungs in the morning and at bedtime. 32.1 g 2   rosuvastatin (CRESTOR) 40 MG tablet Take  1 tablet (40 mg total) by mouth daily. 90 tablet 3   No facility-administered medications prior to visit.     Review of Systems:   Constitutional: No weight loss or gain, night sweats, fevers, chills, fatigue, or lassitude. HEENT: No headaches, difficulty swallowing, tooth/dental problems, or sore throat. No sneezing, itching, ear ache, nasal congestion, or post nasal drip CV:  No chest pain, orthopnea, PND, swelling in lower extremities, anasarca, dizziness, palpitations, syncope Resp: +shortness of breath with exertion (improved; returned to baseline). No excess mucus or change in color of mucus. No productive or non-productive. No hemoptysis. No wheezing.  No chest wall deformity GI:  No heartburn, indigestion, abdominal pain, nausea, vomiting, diarrhea, change in bowel habits, loss of appetite, bloody stools.  GU: No dysuria, change in color of urine, urgency or frequency.  No flank pain, no hematuria  Skin: No rash,  lesions, ulcerations MSK:  +upper back/scapula pain. No joint pain or swelling.  No decreased range of motion.   Neuro: No dizziness or lightheadedness.  Psych: No depression or anxiety. Mood stable.     Physical Exam:  BP 140/78 (BP Location: Left Arm, Patient Position: Sitting, Cuff Size: Normal)    Pulse 90    Temp 98.5 F (36.9 C) (Oral)    Ht 5\' 3"  (1.6 m)    Wt 145 lb (65.8 kg)    SpO2 97%    BMI 25.69 kg/m   GEN: Pleasant, interactive, well-nourished, well-appearing; in no acute distress. HEENT:  Normocephalic and atraumatic. EACs patent bilaterally. TM pearly gray with present light reflex bilaterally. PERRLA. Sclera white. Nasal turbinates pink, moist and patent bilaterally. No rhinorrhea present. Oropharynx pink and moist, without exudate or edema. No lesions, ulcerations, or postnasal drip.  NECK:  Supple w/ fair ROM. No JVD present. Normal carotid impulses w/o bruits. Thyroid symmetrical with no goiter or nodules palpated. No lymphadenopathy.   CV: RRR, no m/r/g, no peripheral edema. Pulses intact, +2 bilaterally. No cyanosis, pallor or clubbing. PULMONARY:  Unlabored, regular breathing. Clear bilaterally A&P w/o wheezes/rales/rhonchi. No accessory muscle use. No dullness to percussion. GI: BS present and normoactive. Soft, non-tender to palpation. No organomegaly or masses detected. No CVA tenderness. MSK: No erythema, warmth or tenderness. Cap refil <2 sec all extrem. No deformities or joint swelling noted. Slightly limited ROM in right shoulder when lifting above head. Neuro: A/Ox3. No focal deficits noted.   Skin: Warm, no lesions or rashe Psych: Normal affect and behavior. Judgement and thought content appropriate.     Lab Results:  CBC    Component Value Date/Time   WBC 8.2 01/03/2021 1039   RBC 4.12 01/03/2021 1039   HGB 12.8 01/03/2021 1039   HGB 13.5 11/21/2020 1025   HCT 38.1 01/03/2021 1039   HCT 39.8 11/21/2020 1025   PLT 227 01/03/2021 1039   PLT 245  11/21/2020 1025   MCV 92.5 01/03/2021 1039   MCV 89 11/21/2020 1025   MCH 31.1 01/03/2021 1039   MCHC 33.6 01/03/2021 1039   RDW 13.4 01/03/2021 1039   RDW 13.0 11/21/2020 1025   LYMPHSABS 1.2 04/21/2019 1128   MONOABS 0.7 04/21/2019 1128   EOSABS 0.2 04/21/2019 1128   BASOSABS 0.0 04/21/2019 1128    BMET    Component Value Date/Time   NA 142 04/05/2021 0953   K 3.9 04/05/2021 0953   CL 100 04/05/2021 0953   CO2 25 04/05/2021 0953   GLUCOSE 107 (H) 04/05/2021 0953   GLUCOSE 89 01/29/2021 0930  BUN 17 04/05/2021 0953   CREATININE 1.25 (H) 04/05/2021 0953   CALCIUM 9.5 04/05/2021 0953   GFRNONAA 50 (L) 01/03/2021 1039   GFRAA 57 (L) 05/05/2019 1129    BNP No results found for: BNP   Imaging:  MM 3D SCREEN BREAST BILATERAL  Result Date: 04/11/2021 CLINICAL DATA:  Screening. EXAM: DIGITAL SCREENING BILATERAL MAMMOGRAM WITH TOMOSYNTHESIS AND CAD TECHNIQUE: Bilateral screening digital craniocaudal and mediolateral oblique mammograms were obtained. Bilateral screening digital breast tomosynthesis was performed. The images were evaluated with computer-aided detection. COMPARISON:  Previous exam(s). ACR Breast Density Category b: There are scattered areas of fibroglandular density. FINDINGS: There are no findings suspicious for malignancy. IMPRESSION: No mammographic evidence of malignancy. A result letter of this screening mammogram will be mailed directly to the patient. RECOMMENDATION: Screening mammogram in one year. (Code:SM-B-01Y) BI-RADS CATEGORY  1: Negative. Electronically Signed   By: Audie Pinto M.D.   On: 04/11/2021 15:05      PFT Results Latest Ref Rng & Units 04/09/2021 12/30/2018  FVC-Pre L 1.51 1.48  FVC-Predicted Pre % 56 50  FVC-Post L 1.42 1.49  FVC-Predicted Post % 52 50  Pre FEV1/FVC % % 66 69  Post FEV1/FCV % % 68 69  FEV1-Pre L 1.00 1.03  FEV1-Predicted Pre % 49 46  FEV1-Post L 0.96 1.03  DLCO uncorrected ml/min/mmHg 9.73 11.77  DLCO UNC% % 52  60  DLCO corrected ml/min/mmHg 9.73 -  DLCO COR %Predicted % 52 -  DLVA Predicted % 89 111  TLC L 3.62 3.67  TLC % Predicted % 73 71  RV % Predicted % 98 91    No results found for: NITRICOXIDE      Assessment & Plan:   Sarcoidosis Appears stable on imaging and PFTs aside from slight decrease in DLCO which is likely related to her COPD. SOB improved and with resolution of cough. Will continue with monitoring. Annual exam with optho. Follow up with rheum in March. Low suspicion that her scapula pain is related as it seems to be an exercise induced injury given the timeline.  Patient Instructions  -Continue Breztri 2 puffs Twice daily, brush tongue and rinse mouth daily. Continue to use spacer -Continue albuterol 3 mL nebs every 6 hours as needed for shortness of breath or wheezing -Continue albuterol inhaler 2 puffs every 6 hours as needed for shortness of breath or wheezing -Continue Nasonex 2 sprays each nostril daily  -Continue Protonix 40 mg daily -Continue Saline nasal spray 2-3 times a day -Continue Zyrtec 10 mg daily -Continue pepcid as needed for GERD symptoms -Continue Mucinex DM as needed for cough and congestion twice a day over the counter   Flutter valve throughout the day   Notify if worsening breathlessness, cough, mucus production, fatigue, or wheezing occurs.  Maintain up to date vaccinations, including influenza, COVID, and pneumococcal.  Wash your hands often and avoid sick exposures.  Encouraged masking in crowds.  Avoid triggers, when possible.  Exercise, as tolerated. Notify if worsening symptoms upon exertion occur.    Referral to Sports Medicine placed today  Yearly exam with opthalmology    Follow up in three months with Dr. Lamonte Sakai. If symptoms do not improve or worsen, please contact office for sooner follow up or seek emergency care.   COPD (chronic obstructive pulmonary disease) (HCC) Improvement in symptoms. Continue Breztri Twice daily and  PRN albuterol. Decrease from 60% to 52% on DLCO when compared to 2020; otherwise no significant changes on PFTs. See above.  Allergic rhinitis Advised to monitor going into allergy season and notify of any worsening symptoms.   GERD (gastroesophageal reflux disease) Well-controlled on PPI therapy and PRN pepcid.  Chronic scapular pain Started over the summer after exercise. Intermittent and associated with activity. Seems to be an exercise induced injury. Unable to use NSAID therapy per cardiology. Using tylenol and topical analgesics. Referral to sports medicine.  Snoring Recent ONO with av. ODI 24/hr; only 5 seconds spent <88%. Wore for 4 hours and 23 min. Given slightly elevated ODI, snoring and daytime fatigue, will obtain HST to further assess.    Clayton Bibles, NP 04/13/2021  Pt aware and understands NP's role.

## 2021-04-12 NOTE — Assessment & Plan Note (Signed)
Appears stable on imaging and PFTs aside from slight decrease in DLCO which is likely related to her COPD. SOB improved and with resolution of cough. Will continue with monitoring. Annual exam with optho. Follow up with rheum in March. Low suspicion that her scapula pain is related as it seems to be an exercise induced injury given the timeline.  Patient Instructions  -Continue Breztri 2 puffs Twice daily, brush tongue and rinse mouth daily. Continue to use spacer -Continue albuterol 3 mL nebs every 6 hours as needed for shortness of breath or wheezing -Continue albuterol inhaler 2 puffs every 6 hours as needed for shortness of breath or wheezing -Continue Nasonex 2 sprays each nostril daily  -Continue Protonix 40 mg daily -Continue Saline nasal spray 2-3 times a day -Continue Zyrtec 10 mg daily -Continue pepcid as needed for GERD symptoms -Continue Mucinex DM as needed for cough and congestion twice a day over the counter   Flutter valve throughout the day   Notify if worsening breathlessness, cough, mucus production, fatigue, or wheezing occurs.  Maintain up to date vaccinations, including influenza, COVID, and pneumococcal.  Wash your hands often and avoid sick exposures.  Encouraged masking in crowds.  Avoid triggers, when possible.  Exercise, as tolerated. Notify if worsening symptoms upon exertion occur.    Referral to Sports Medicine placed today  Yearly exam with opthalmology    Follow up in three months with Dr. Lamonte Sakai. If symptoms do not improve or worsen, please contact office for sooner follow up or seek emergency care.

## 2021-04-12 NOTE — Telephone Encounter (Signed)
Called [facility] to get ONO results.  Advised they would fax the results to [phone]. Will await fax.

## 2021-04-12 NOTE — Assessment & Plan Note (Signed)
Advised to monitor going into allergy season and notify of any worsening symptoms.

## 2021-04-12 NOTE — Patient Instructions (Addendum)
-  Continue Breztri 2 puffs Twice daily, brush tongue and rinse mouth daily. Continue to use spacer -Continue albuterol 3 mL nebs every 6 hours as needed for shortness of breath or wheezing -Continue albuterol inhaler 2 puffs every 6 hours as needed for shortness of breath or wheezing -Continue Nasonex 2 sprays each nostril daily  -Continue Protonix 40 mg daily -Continue Saline nasal spray 2-3 times a day -Continue Zyrtec 10 mg daily -Continue pepcid as needed for GERD symptoms -Continue Mucinex DM as needed for cough and congestion twice a day over the counter   Flutter valve throughout the day   Notify if worsening breathlessness, cough, mucus production, fatigue, or wheezing occurs.  Maintain up to date vaccinations, including influenza, COVID, and pneumococcal.  Wash your hands often and avoid sick exposures.  Encouraged masking in crowds.  Avoid triggers, when possible.  Exercise, as tolerated. Notify if worsening symptoms upon exertion occur.    Referral to Sports Medicine placed today  Yearly exam with opthalmology    Follow up in three months with Dr. Lamonte Sakai. If symptoms do not improve or worsen, please contact office for sooner follow up or seek emergency care.

## 2021-04-12 NOTE — Assessment & Plan Note (Signed)
Started over the summer after exercise. Intermittent and associated with activity. Seems to be an exercise induced injury. Unable to use NSAID therapy per cardiology. Using tylenol and topical analgesics. Referral to sports medicine.

## 2021-04-12 NOTE — Assessment & Plan Note (Signed)
Well-controlled on PPI therapy and PRN pepcid.

## 2021-04-12 NOTE — Assessment & Plan Note (Addendum)
Improvement in symptoms. Continue Breztri Twice daily and PRN albuterol. Decrease from 60% to 52% on DLCO when compared to 2020; otherwise no significant changes on PFTs. See above.

## 2021-04-13 DIAGNOSIS — R0683 Snoring: Secondary | ICD-10-CM | POA: Insufficient documentation

## 2021-04-13 NOTE — Addendum Note (Signed)
Addended by: Clayton Bibles on: 04/13/2021 01:37 PM   Modules accepted: Orders

## 2021-04-13 NOTE — Progress Notes (Signed)
° ° °  Subjective:    CC: Scapular pain  I, [name], LAT, ATC, am serving as scribe for Dr. [name].  HPI: Pt is a 76 y/o female presenting w/ c/o R scapular pain since summer 2022 that is associated w/ exercise.  She states that the pain began in her L scapula and then moved to her R scapula.  She locates her pain to her B medial scapular borders, R>L, and feels like she has a knot in the area.  She can't take IBU w/ any regularity per cardiology.  She recently had surgery to remove skin cancer on her R forearm so has limited mobility in her R arm and R shoulder.  Radiating pain: No Aggravating factors: worsens as the day progresses Treatments tried: Tylenol; topical analgesics;  Pertinent review of Systems: No fevers or chills  Relevant historical information: Sarcoidosis   Objective:    Vitals:   04/16/21 0955  BP: (!) 142/82  Pulse: 81  SpO2: 98%   General: Well Developed, well nourished, and in no acute distress.   MSK: C-spine: Normal.  Nontender midline normal cervical motion. Thoracic back: Nontender spinal midline. Mildly tender to palpation rhomboid region on the right. Some pain with scapular motion on the right. Full arm motion not tested due to recent forearm surgery.  Lab and Radiology Results  X-ray images right posterior chest wall and thoracic spine visualized during CT scan of the chest October 2022 personally independently interpreted. No thoracic pression fracture. No soft tissue mass visualized.   Impression and Recommendations:    Assessment and Plan: 76 y.o. female with  Right thoracic back pain near the rhomboid region.  Pain thought to be more muscle spasm and dysfunction related than anything else.  Plan for trial of physical therapy.  Additionally recommend heating pad.  We discussed muscle relaxers.  Because her pain occurs during the day and not much at night I do not think muscle relaxers will be very helpful and neither does she.    Recheck in about 6 weeks.Marland Kitchen  PDMP not reviewed this encounter. Orders Placed This Encounter  Procedures   Ambulatory referral to Physical Therapy    Referral Priority:   Routine    Referral Type:   Physical Medicine    Referral Reason:   Specialty Services Required    Requested Specialty:   Physical Therapy    Number of Visits Requested:   1   No orders of the defined types were placed in this encounter.   Discussed warning signs or symptoms. Please see discharge instructions. Patient expresses understanding.   The above documentation has been reviewed and is accurate and complete [name], M.D.

## 2021-04-13 NOTE — Assessment & Plan Note (Signed)
Recent ONO with av. ODI 24/hr; only 5 seconds spent <88%. Wore for 4 hours and 23 min. Given slightly elevated ODI, snoring and daytime fatigue, will obtain HST to further assess.

## 2021-04-16 ENCOUNTER — Ambulatory Visit (INDEPENDENT_AMBULATORY_CARE_PROVIDER_SITE_OTHER): Payer: Medicare Other | Admitting: Family Medicine

## 2021-04-16 ENCOUNTER — Other Ambulatory Visit: Payer: Self-pay

## 2021-04-16 ENCOUNTER — Encounter: Payer: Self-pay | Admitting: Family Medicine

## 2021-04-16 VITALS — BP 142/82 | HR 81 | Ht 63.0 in | Wt 146.2 lb

## 2021-04-16 DIAGNOSIS — M546 Pain in thoracic spine: Secondary | ICD-10-CM

## 2021-04-16 DIAGNOSIS — G8929 Other chronic pain: Secondary | ICD-10-CM | POA: Diagnosis not present

## 2021-04-16 DIAGNOSIS — M898X1 Other specified disorders of bone, shoulder: Secondary | ICD-10-CM

## 2021-04-16 NOTE — Patient Instructions (Addendum)
Nice to meet you today.  I've referred you to Physical Therapy.  Their office will call you to schedule but please let us know if you don't hear from them in one week regarding scheduling.  Look into getting a heating pad like we discussed.  Follow-up: 6 weeks

## 2021-04-17 ENCOUNTER — Encounter: Payer: Self-pay | Admitting: Physical Therapy

## 2021-04-17 ENCOUNTER — Ambulatory Visit: Payer: Medicare Other | Attending: Family Medicine | Admitting: Physical Therapy

## 2021-04-17 DIAGNOSIS — G8929 Other chronic pain: Secondary | ICD-10-CM | POA: Diagnosis not present

## 2021-04-17 DIAGNOSIS — M6281 Muscle weakness (generalized): Secondary | ICD-10-CM | POA: Diagnosis present

## 2021-04-17 DIAGNOSIS — M62830 Muscle spasm of back: Secondary | ICD-10-CM | POA: Insufficient documentation

## 2021-04-17 DIAGNOSIS — M546 Pain in thoracic spine: Secondary | ICD-10-CM | POA: Diagnosis present

## 2021-04-17 DIAGNOSIS — M898X1 Other specified disorders of bone, shoulder: Secondary | ICD-10-CM | POA: Insufficient documentation

## 2021-04-17 NOTE — Therapy (Signed)
[place]. [place], [place], [address] Phone: [phone]   Fax:  [phone]  Physical Therapy Evaluation  Patient Details  Name: Kristen Suarez MRN: [id number] Date of Birth: [birth year] Referring Provider (PT): Georgina Snell   Encounter Date: 04/17/2021   PT End of Session - 04/17/21 1719     Visit Number 1    Date for PT Re-Evaluation 07/15/21    Authorization Type [employer]    PT Start Time 8250    PT Stop Time 5397    PT Time Calculation (min) 65 min    Activity Tolerance Patient tolerated treatment well    Behavior During Therapy [facility] for tasks assessed/performed             Past Medical History:  Diagnosis Date   Arthritis    Asthma    CAD in native artery 08/15/2020   Chickenpox    Colon polyps 2010   Epistaxis 03/29/2021   Heart murmur    History of fainting spells of unknown cause    Hyperlipidemia    Hypertension    Sarcoid 06/16/1976    Past Surgical History:  Procedure Laterality Date   ABDOMINAL HYSTERECTOMY  1987   BASAL CELL CARCINOMA EXCISION  06/30/2020   nose   LUNG BIOPSY  06/16/1976   PERIPHERAL VASCULAR INTERVENTION Right 11/29/2020   Procedure: PERIPHERAL VASCULAR INTERVENTION;  Surgeon: Wellington Hampshire, MD;  Location: [facility];  Service: Cardiovascular;  Laterality: Right;  Renal Artery   RENAL ANGIOGRAPHY N/A 11/29/2020   Procedure: RENAL ANGIOGRAPHY;  Surgeon: Wellington Hampshire, MD;  Location: [facility];  Service: Cardiovascular;  Laterality: N/A;    There were no vitals filed for this visit.    Subjective Assessment - 04/17/21 1615     Subjective Patient reports that she has had issues with the pain between the shoulder blades for a number of months, she reports having a stent in the kidney and some pulmonary issues that she feels caused her to be more bed rest and things were better but then got worse.  Describes knots and pain constant    Pertinent  History sarcoid    Patient Stated Goals have less pain    Currently in Pain? Yes    Pain Score 5     Pain Location Scapula    Pain Orientation Right;Left    Pain Descriptors / Indicators Aching    Pain Type Acute pain    Pain Radiating Towards some into the back    Pain Onset More than a month ago    Pain Frequency Constant    Aggravating Factors  standing, cooking, activity, pain up to 9-10/10    Pain Relieving Factors first thing in the morning, heat, 4.10 at best    Effect of Pain on Daily Activities limits ADL's limits exercises                [facility] PT Assessment - 04/17/21 0001       Assessment   Medical Diagnosis scapular pain    Referring Provider (PT) Georgina Snell    Onset Date/Surgical Date 03/17/21    Hand Dominance Right    Prior Therapy no      Precautions   Precautions None      Balance Screen   Has the patient fallen in the past 6 months Yes    How many times? 2    Has the patient had a decrease in activity  level because of a fear of falling?  Yes    Is the patient reluctant to leave their home because of a fear of falling?  No      Home Environment   Additional Comments single floor, does some hosuework      Prior Function   Level of Independence Independent    Vocation Retired    Leisure gym 3x/week, some classes and some machines      Posture/Postural Control   Posture Comments fwd head, rounded shoulders, kyphotic      ROM / Strength   AROM / PROM / Strength AROM;Strength      AROM   Overall AROM Comments Cervical ROM is very limited decreaesd 75% for all motions decreased 100% for extension, shoulder ROM is WFL's      Strength   Overall Strength Comments 4-/5 with some pain in the sholders      Palpation   Palpation comment very tender in the upper traps and the rhomboids                        Objective measurements completed on examination: See above findings.       [facility] Adult PT Treatment/Exercise - 04/17/21 0001        Modalities   Modalities Moist Heat;Electrical Stimulation      Moist Heat Therapy   Number Minutes Moist Heat 10 Minutes    Moist Heat Location Cervical      Electrical Stimulation   Electrical Stimulation Location rhomboids and upper traps    Electrical Stimulation Action IFC    Electrical Stimulation Parameters supine    Electrical Stimulation Goals Pain              Trigger Point Dry Needling - 04/17/21 0001     Consent Given? Yes    Education Handout Provided Yes    Muscles Treated Head and Neck Upper trapezius    Upper Trapezius Response Twitch reponse elicited                     PT Short Term Goals - 04/17/21 1734       PT SHORT TERM GOAL #1   Title independent with initial HEP    Time 3    Period Weeks    Status New               PT Long Term Goals - 04/17/21 1735       PT LONG TERM GOAL #1   Title independent with advanced HEP    Time 12    Period Weeks    Status New      PT LONG TERM GOAL #2   Title decreased pain 50%    Time 12    Period Weeks    Status New      PT LONG TERM GOAL #3   Title Understand posture and body mechanics    Time 12    Period Weeks    Status New      PT LONG TERM GOAL #4   Title Increase cervical ROM 25%    Time 12    Period Weeks    Status New      PT LONG TERM GOAL #5   Title return to the gym safely    Time 12    Period Weeks    Status New  Plan - 04/17/21 1720     Clinical Impression Statement Patient has scapular pain, she reports that she has had some health issues that have caused her to be more sedentary at times, but reports that she was having pain prior to this, She has a kyphotic and very forward head posture.  He pain is mostly in the rhomboid area, she is also very tender and tight in the upper traps, she has very limited cervical ROM, shoulder motions are WFL's, she is weak in the shoulders.  She has lung issues and has been very active with  the gym in the past, just had some health setbacks and now this significant pain, she had no pain with resisted breathing.    Stability/Clinical Decision Making Stable/Uncomplicated    Clinical Decision Making Low    Rehab Potential Good    PT Frequency 2x / week    PT Duration 12 weeks    PT Treatment/Interventions ADLs/Self Care Home Management;Electrical Stimulation;Cryotherapy;Moist Heat;Ultrasound;Neuromuscular re-education;Therapeutic exercise;Therapeutic activities;Functional mobility training;Stair training;Patient/family education;Manual techniques;Dry needling    PT Next Visit Plan Assess the treatment, and work on scapular stabilization    Consulted and Agree with Plan of Care Patient             Patient will benefit from skilled therapeutic intervention in order to improve the following deficits and impairments:  Decreased range of motion, Increased muscle spasms, Decreased activity tolerance, Pain, Postural dysfunction, Decreased strength, Impaired flexibility  Visit Diagnosis: Muscle weakness (generalized) - Plan: PT plan of care cert/re-cert  Pain in thoracic spine - Plan: PT plan of care cert/re-cert  Muscle spasm of back - Plan: PT plan of care cert/re-cert     Problem List Patient Active Problem List   Diagnosis Date Noted   Snoring 04/13/2021   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   Epistaxis 03/29/2021   CAD in native artery 08/15/2020   Basal cell carcinoma 06/30/2020   History of loop recorder 12/13/2019   Allergic rhinitis 12/08/2019   Cough 11/08/2019   Post-nasal drip 11/08/2019   Multiple joint pain 04/22/2019   Bilateral leg edema 04/22/2019   Vaginal atrophy 01/05/2019   Resting tremor 09/08/2018   Sarcoidosis 05/08/2018   HTN (hypertension) 05/08/2018   Colon polyp 05/08/2018   Family hx of colon cancer 05/08/2018   Cystocele, midline 05/08/2018   Hyperlipidemia 05/08/2018   Vasovagal syncope 05/08/2018   Seborrheic keratosis 05/08/2018    Pseudophakia of both eyes 05/08/2018   GERD (gastroesophageal reflux disease) 05/08/2018   COPD (chronic obstructive pulmonary disease) ([place]) 05/08/2018    Sumner Boast, PT 04/17/2021, 5:41 PM  [place]. [place], [place], [address] Phone: [phone]   Fax:  [phone]  Name: Kristen Suarez MRN: [id number] Date of Birth: 10-21-1945

## 2021-04-17 NOTE — Patient Instructions (Signed)

## 2021-04-19 ENCOUNTER — Ambulatory Visit: Payer: Medicare Other | Attending: Family Medicine | Admitting: Physical Therapy

## 2021-04-19 ENCOUNTER — Other Ambulatory Visit: Payer: Self-pay

## 2021-04-19 DIAGNOSIS — M62830 Muscle spasm of back: Secondary | ICD-10-CM | POA: Insufficient documentation

## 2021-04-19 DIAGNOSIS — R278 Other lack of coordination: Secondary | ICD-10-CM | POA: Insufficient documentation

## 2021-04-19 DIAGNOSIS — M6281 Muscle weakness (generalized): Secondary | ICD-10-CM | POA: Insufficient documentation

## 2021-04-19 DIAGNOSIS — M546 Pain in thoracic spine: Secondary | ICD-10-CM | POA: Insufficient documentation

## 2021-04-19 NOTE — Therapy (Signed)
[place]. [place], [place], [address] Phone: [phone]   Fax:  [phone]  Physical Therapy Treatment  Patient Details  Name: Kristen Suarez MRN: [id number] Date of Birth: 1945/10/21 Referring Provider (PT): Marikay Alar Date: 04/19/2021   PT End of Session - 04/19/21 0909     Visit Number 2    Date for PT Re-Evaluation 07/15/21    PT Start Time 4158    PT Stop Time 0925    PT Time Calculation (min) 47 min             Past Medical History:  Diagnosis Date   Arthritis    Asthma    CAD in native artery 08/15/2020   Chickenpox    Colon polyps 2010   Epistaxis 03/29/2021   Heart murmur    History of fainting spells of unknown cause    Hyperlipidemia    Hypertension    Sarcoid 06/16/1976    Past Surgical History:  Procedure Laterality Date   ABDOMINAL HYSTERECTOMY  1987   BASAL CELL CARCINOMA EXCISION  06/30/2020   nose   LUNG BIOPSY  06/16/1976   PERIPHERAL VASCULAR INTERVENTION Right 11/29/2020   Procedure: PERIPHERAL VASCULAR INTERVENTION;  Surgeon: Wellington Hampshire, MD;  Location: [facility];  Service: Cardiovascular;  Laterality: Right;  Renal Artery   RENAL ANGIOGRAPHY N/A 11/29/2020   Procedure: RENAL ANGIOGRAPHY;  Surgeon: Wellington Hampshire, MD;  Location: [facility];  Service: Cardiovascular;  Laterality: N/A;    There were no vitals filed for this visit.   Subjective Assessment - 04/19/21 0840     Subjective sore from DN but then relief. at gym yesterday walked 15 min and did limited ex d/t RT arm    Currently in Pain? Yes    Pain Score 1     Pain Location Scapula                               [facility] Adult PT Treatment/Exercise - 04/19/21 0001       Exercises   Exercises Neck;Shoulder      Neck Exercises: Machines for Strengthening   UBE (Upper Arm Bike) L 1 2 min fwd/2 min back      Neck Exercises: Theraband   Shoulder Extension  10 reps   yellow   Rows 10 reps   yellow   Shoulder External Rotation 10 reps   yellow   Horizontal ABduction 10 reps   yellow     Neck Exercises: Standing   Neck Retraction 10 reps;3 secs    Other Standing Exercises 3# shruggs and backward rolls 5 x each      Shoulder Exercises: Standing   Other Standing Exercises 2# cane ex 10 x each      Modalities   Modalities Moist Heat;Electrical Stimulation      Moist Heat Therapy   Number Minutes Moist Heat 10 Minutes    Moist Heat Location Cervical      Electrical Stimulation   Electrical Stimulation Location rhomboids and upper traps    Electrical Stimulation Action IFC    Electrical Stimulation Parameters prone    Electrical Stimulation Goals Pain      Manual Therapy   Manual Therapy Soft tissue mobilization    Manual therapy comments rhod very tender, tightness through out ,very rounded    Soft tissue mobilization cerv,trap and rhom  PT Short Term Goals - 04/17/21 1734       PT SHORT TERM GOAL #1   Title independent with initial HEP    Time 3    Period Weeks    Status New               PT Long Term Goals - 04/17/21 1735       PT LONG TERM GOAL #1   Title independent with advanced HEP    Time 12    Period Weeks    Status New      PT LONG TERM GOAL #2   Title decreased pain 50%    Time 12    Period Weeks    Status New      PT LONG TERM GOAL #3   Title Understand posture and body mechanics    Time 12    Period Weeks    Status New      PT LONG TERM GOAL #4   Title Increase cervical ROM 25%    Time 12    Period Weeks    Status New      PT LONG TERM GOAL #5   Title return to the gym safely    Time 12    Period Weeks    Status New                   Plan - 04/19/21 0910     Clinical Impression Statement pt arrives feeling better. tolerated initail progression of postrual ex well with light resisitance and ROM as tolerated with cuing. TP in trap and rhom  -tolerated STW well ( may consider DN next session) STG met    PT Treatment/Interventions ADLs/Self Care Home Management;Electrical Stimulation;Cryotherapy;Moist Heat;Ultrasound;Neuromuscular re-education;Therapeutic exercise;Therapeutic activities;Functional mobility training;Stair training;Patient/family education;Manual techniques;Dry needling    PT Next Visit Plan Assess the treatment, and work on scapular stabilization DN?             Patient will benefit from skilled therapeutic intervention in order to improve the following deficits and impairments:  Decreased range of motion, Increased muscle spasms, Decreased activity tolerance, Pain, Postural dysfunction, Decreased strength, Impaired flexibility  Visit Diagnosis: Muscle weakness (generalized)  Pain in thoracic spine  Muscle spasm of back     Problem List Patient Active Problem List   Diagnosis Date Noted   Snoring 04/13/2021   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   Epistaxis 03/29/2021   CAD in native artery 08/15/2020   Basal cell carcinoma 06/30/2020   History of loop recorder 12/13/2019   Allergic rhinitis 12/08/2019   Cough 11/08/2019   Post-nasal drip 11/08/2019   Multiple joint pain 04/22/2019   Bilateral leg edema 04/22/2019   Vaginal atrophy 01/05/2019   Resting tremor 09/08/2018   Sarcoidosis 05/08/2018   HTN (hypertension) 05/08/2018   Colon polyp 05/08/2018   Family hx of colon cancer 05/08/2018   Cystocele, midline 05/08/2018   Hyperlipidemia 05/08/2018   Vasovagal syncope 05/08/2018   Seborrheic keratosis 05/08/2018   Pseudophakia of both eyes 05/08/2018   GERD (gastroesophageal reflux disease) 05/08/2018   COPD (chronic obstructive pulmonary disease) ([place]) 05/08/2018    Zari Cly,ANGIE, PTA 04/19/2021, 9:12 AM  [place]. [place], [place], [address] Phone: [phone]   Fax:  [phone]  Name: Kristen Suarez MRN:  [id number] Date of Birth: 14-Feb-1946

## 2021-04-24 ENCOUNTER — Other Ambulatory Visit: Payer: Self-pay

## 2021-04-24 ENCOUNTER — Ambulatory Visit: Payer: Medicare Other | Admitting: Physical Therapy

## 2021-04-24 ENCOUNTER — Ambulatory Visit: Payer: Medicare Other | Admitting: Emergency Medicine

## 2021-04-24 DIAGNOSIS — M6281 Muscle weakness (generalized): Secondary | ICD-10-CM

## 2021-04-24 DIAGNOSIS — M62830 Muscle spasm of back: Secondary | ICD-10-CM

## 2021-04-24 DIAGNOSIS — M546 Pain in thoracic spine: Secondary | ICD-10-CM

## 2021-04-24 NOTE — Therapy (Signed)
[place]. [place], [place], [address] Phone: [phone]   Fax:  [phone]  Physical Therapy Treatment  Patient Details  Name: Kristen Suarez MRN: [id number] Date of Birth: 03-05-1946 Referring Provider (PT): Marikay Alar Date: 04/24/2021   PT End of Session - 04/24/21 0943     Visit Number 3    Date for PT Re-Evaluation 07/15/21    Authorization Type [employer]    PT Start Time 4315    PT Stop Time 1012    PT Time Calculation (min) 47 min             Past Medical History:  Diagnosis Date   Arthritis    Asthma    CAD in native artery 08/15/2020   Chickenpox    Colon polyps 2010   Epistaxis 03/29/2021   Heart murmur    History of fainting spells of unknown cause    Hyperlipidemia    Hypertension    Sarcoid 06/16/1976    Past Surgical History:  Procedure Laterality Date   ABDOMINAL HYSTERECTOMY  1987   BASAL CELL CARCINOMA EXCISION  06/30/2020   nose   LUNG BIOPSY  06/16/1976   PERIPHERAL VASCULAR INTERVENTION Right 11/29/2020   Procedure: PERIPHERAL VASCULAR INTERVENTION;  Surgeon: Wellington Hampshire, MD;  Location: [facility];  Service: Cardiovascular;  Laterality: Right;  Renal Artery   RENAL ANGIOGRAPHY N/A 11/29/2020   Procedure: RENAL ANGIOGRAPHY;  Surgeon: Wellington Hampshire, MD;  Location: [facility];  Service: Cardiovascular;  Laterality: N/A;    There were no vitals filed for this visit.   Subjective Assessment - 04/24/21 0924     Subjective tolerated last session well. mostly exhausted after ex. overall better. I am trying to move neck better but would like to tx more DN    Currently in Pain? Yes    Pain Score 1     Pain Location Neck                               [facility] Adult PT Treatment/Exercise - 04/24/21 0001       Neck Exercises: Machines for Strengthening   UBE (Upper Arm Bike) L 2 2 min fwd/2 min back      Neck Exercises:  Theraband   Shoulder Extension 10 reps;Red    Rows 10 reps;Red    Shoulder External Rotation 10 reps;Red    Horizontal ABduction 10 reps;Red      Neck Exercises: Standing   Neck Retraction 10 reps;3 secs    Other Standing Exercises 4# shruggs and backward rolls 5 x each      Modalities   Modalities Moist Heat;Electrical Stimulation      Moist Heat Therapy   Number Minutes Moist Heat 10 Minutes    Moist Heat Location Cervical      Electrical Stimulation   Electrical Stimulation Location rhomboids and upper traps    Electrical Stimulation Action IFC    Electrical Stimulation Parameters prone    Electrical Stimulation Goals Pain      Manual Therapy   Manual Therapy Soft tissue mobilization    Manual therapy comments rhod very tender, tightness through out ,very rounded    Soft tissue mobilization cerv,trap and rhom              Trigger Point Dry Needling - 04/24/21 0001     Consent Given? Yes  Muscles Treated Head and Neck Suboccipitals    Upper Trapezius Response Twitch reponse elicited    Suboccipitals Response Twitch response elicited                   PT Education - 04/24/21 0943     Education Details cerv retraction, shruggs and backward rolls. 4 way red tband scap stab    Person(s) Educated Patient    Methods Explanation;Demonstration;Handout    Comprehension Verbalized understanding;Returned demonstration              PT Short Term Goals - 04/17/21 1734       PT SHORT TERM GOAL #1   Title independent with initial HEP    Time 3    Period Weeks    Status New               PT Long Term Goals - 04/17/21 1735       PT LONG TERM GOAL #1   Title independent with advanced HEP    Time 12    Period Weeks    Status New      PT LONG TERM GOAL #2   Title decreased pain 50%    Time 12    Period Weeks    Status New      PT LONG TERM GOAL #3   Title Understand posture and body mechanics    Time 12    Period Weeks    Status New       PT LONG TERM GOAL #4   Title Increase cervical ROM 25%    Time 12    Period Weeks    Status New      PT LONG TERM GOAL #5   Title return to the gym safely    Time 12    Period Weeks    Status New                   Plan - 04/24/21 3810     Clinical Impression Statement pt tolerated last session well so progressed resistance and wt and issued HEP. DN again at pt request, increased cerv ROM but still tight and limited(  very fwd head and kyphotic).    PT Treatment/Interventions ADLs/Self Care Home Management;Electrical Stimulation;Cryotherapy;Moist Heat;Ultrasound;Neuromuscular re-education;Therapeutic exercise;Therapeutic activities;Functional mobility training;Stair training;Patient/family education;Manual techniques;Dry needling    PT Next Visit Plan progress postural ex             Patient will benefit from skilled therapeutic intervention in order to improve the following deficits and impairments:  Decreased range of motion, Increased muscle spasms, Decreased activity tolerance, Pain, Postural dysfunction, Decreased strength, Impaired flexibility  Visit Diagnosis: Muscle weakness (generalized)  Pain in thoracic spine  Muscle spasm of back     Problem List Patient Active Problem List   Diagnosis Date Noted   Snoring 04/13/2021   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   Epistaxis 03/29/2021   CAD in native artery 08/15/2020   Basal cell carcinoma 06/30/2020   History of loop recorder 12/13/2019   Allergic rhinitis 12/08/2019   Cough 11/08/2019   Post-nasal drip 11/08/2019   Multiple joint pain 04/22/2019   Bilateral leg edema 04/22/2019   Vaginal atrophy 01/05/2019   Resting tremor 09/08/2018   Sarcoidosis 05/08/2018   HTN (hypertension) 05/08/2018   Colon polyp 05/08/2018   Family hx of colon cancer 05/08/2018   Cystocele, midline 05/08/2018   Hyperlipidemia 05/08/2018   Vasovagal syncope 05/08/2018   Seborrheic keratosis 05/08/2018  Pseudophakia of both eyes 05/08/2018   GERD (gastroesophageal reflux disease) 05/08/2018   COPD (chronic obstructive pulmonary disease) ([place]) 05/08/2018    Timi Reeser,ANGIE, PTA 04/24/2021, 9:54 AM  [place]. [place], [place], [address] Phone: [phone]   Fax:  [phone]  Name: TAWNEY VANORMAN MRN: [id number] Date of Birth: 1946/01/21

## 2021-04-25 ENCOUNTER — Encounter: Payer: Medicare Other | Admitting: Physical Therapy

## 2021-04-26 ENCOUNTER — Other Ambulatory Visit: Payer: Self-pay

## 2021-04-26 ENCOUNTER — Ambulatory Visit: Payer: Medicare Other | Admitting: Physical Therapy

## 2021-04-26 DIAGNOSIS — M546 Pain in thoracic spine: Secondary | ICD-10-CM

## 2021-04-26 DIAGNOSIS — M6281 Muscle weakness (generalized): Secondary | ICD-10-CM

## 2021-04-26 DIAGNOSIS — M62830 Muscle spasm of back: Secondary | ICD-10-CM

## 2021-04-26 NOTE — Therapy (Signed)
[place]. [place], [place], [address] Phone: [phone]   Fax:  [phone]  Physical Therapy Treatment  Patient Details  Name: Kristen Suarez MRN: [id number] Date of Birth: Nov 09, 1945 Referring Provider (PT): Marikay Alar Date: 04/26/2021   PT End of Session - 04/26/21 0917     Visit Number 4    Date for PT Re-Evaluation 07/15/21    Authorization Type [employer]    PT Start Time 0840    PT Stop Time 0930    PT Time Calculation (min) 50 min             Past Medical History:  Diagnosis Date   Arthritis    Asthma    CAD in native artery 08/15/2020   Chickenpox    Colon polyps 2010   Epistaxis 03/29/2021   Heart murmur    History of fainting spells of unknown cause    Hyperlipidemia    Hypertension    Sarcoid 06/16/1976    Past Surgical History:  Procedure Laterality Date   ABDOMINAL HYSTERECTOMY  1987   BASAL CELL CARCINOMA EXCISION  06/30/2020   nose   LUNG BIOPSY  06/16/1976   PERIPHERAL VASCULAR INTERVENTION Right 11/29/2020   Procedure: PERIPHERAL VASCULAR INTERVENTION;  Surgeon: Wellington Hampshire, MD;  Location: [facility];  Service: Cardiovascular;  Laterality: Right;  Renal Artery   RENAL ANGIOGRAPHY N/A 11/29/2020   Procedure: RENAL ANGIOGRAPHY;  Surgeon: Wellington Hampshire, MD;  Location: [facility];  Service: Cardiovascular;  Laterality: N/A;    There were no vitals filed for this visit.   Subjective Assessment - 04/26/21 0841     Subjective feeling better, mostly fatigue in mid back. want to go back to gym ex    Currently in Pain? Yes    Pain Score 1     Pain Location Back                               [facility] Adult PT Treatment/Exercise - 04/26/21 0001       Neck Exercises: Machines for Strengthening   UBE (Upper Arm Bike) L 2 2 min fwd/2 min back    Cybex Row 15# 2 sets 10    Cybex Chest Press 5# 2 sets 10    Lat Pull 15# 2 sets 10     Other Machines for Strengthening trunk flex and ext with tband      Neck Exercises: Standing   Other Standing Exercises 2# chest press, shld flex,abd and ext 10 each    Other Standing Exercises 5# cable pulleys  shld ext and row 10 x                     PT Education - 04/26/21 0910     Education Details edcu on appropriate machines for gym at pt request    Person(s) Educated Patient    Methods Explanation;Demonstration    Comprehension Verbalized understanding;Returned demonstration              PT Short Term Goals - 04/17/21 1734       PT SHORT TERM GOAL #1   Title independent with initial HEP    Time 3    Period Weeks    Status New               PT Long Term Goals - 04/26/21  0907       PT LONG TERM GOAL #1   Title independent with advanced HEP    Baseline advanced to gym    Status Partially Met      PT LONG TERM GOAL #2   Title decreased pain 50%    Status Achieved      PT LONG TERM GOAL #3   Title Understand posture and body mechanics    Status Partially Met      PT LONG TERM GOAL #4   Title Increase cervical ROM 25%    Status On-going      PT LONG TERM GOAL #5   Title return to the gym safely    Baseline given ex to resume gym ex    Status Partially Met                   Plan - 04/26/21 0917     Clinical Impression Statement progressing with goals. at pt request restarted gym/machine ex as she wants to restart at [facility]. Pt tolerated well. Pt relief with modalities. 50% plus better overall , limited cerv ROM- kyphotic and fwd head    PT Treatment/Interventions ADLs/Self Care Home Management;Electrical Stimulation;Cryotherapy;Moist Heat;Ultrasound;Neuromuscular re-education;Therapeutic exercise;Therapeutic activities;Functional mobility training;Stair training;Patient/family education;Manual techniques;Dry needling    PT Next Visit Plan assess how gym went             Patient will benefit from skilled therapeutic  intervention in order to improve the following deficits and impairments:  Decreased range of motion, Increased muscle spasms, Decreased activity tolerance, Pain, Postural dysfunction, Decreased strength, Impaired flexibility  Visit Diagnosis: Muscle weakness (generalized)  Pain in thoracic spine  Muscle spasm of back     Problem List Patient Active Problem List   Diagnosis Date Noted   Snoring 04/13/2021   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   Epistaxis 03/29/2021   CAD in native artery 08/15/2020   Basal cell carcinoma 06/30/2020   History of loop recorder 12/13/2019   Allergic rhinitis 12/08/2019   Cough 11/08/2019   Post-nasal drip 11/08/2019   Multiple joint pain 04/22/2019   Bilateral leg edema 04/22/2019   Vaginal atrophy 01/05/2019   Resting tremor 09/08/2018   Sarcoidosis 05/08/2018   HTN (hypertension) 05/08/2018   Colon polyp 05/08/2018   Family hx of colon cancer 05/08/2018   Cystocele, midline 05/08/2018   Hyperlipidemia 05/08/2018   Vasovagal syncope 05/08/2018   Seborrheic keratosis 05/08/2018   Pseudophakia of both eyes 05/08/2018   GERD (gastroesophageal reflux disease) 05/08/2018   COPD (chronic obstructive pulmonary disease) ([place]) 05/08/2018    Yvaine Jankowiak,ANGIE, PTA 04/26/2021, 9:20 AM  [place]. [place], [place], [address] Phone: [phone]   Fax:  [phone]  Name: Kristen Suarez MRN: [id number] Date of Birth: 10/24/1945

## 2021-04-28 ENCOUNTER — Other Ambulatory Visit: Payer: Self-pay | Admitting: Cardiovascular Disease

## 2021-04-30 NOTE — Telephone Encounter (Signed)
Refill request

## 2021-05-01 ENCOUNTER — Ambulatory Visit: Payer: Medicare Other | Admitting: Physical Therapy

## 2021-05-01 ENCOUNTER — Other Ambulatory Visit: Payer: Self-pay

## 2021-05-01 DIAGNOSIS — M546 Pain in thoracic spine: Secondary | ICD-10-CM

## 2021-05-01 DIAGNOSIS — M6281 Muscle weakness (generalized): Secondary | ICD-10-CM | POA: Diagnosis not present

## 2021-05-01 DIAGNOSIS — M62830 Muscle spasm of back: Secondary | ICD-10-CM

## 2021-05-01 NOTE — Therapy (Signed)
[place]. [place], [place], [address] Phone: [phone]   Fax:  [phone]  Physical Therapy Treatment  Patient Details  Name: Kristen Suarez MRN: [id number] Date of Birth: 06-Sep-1945 Referring Provider (PT): Georgina Snell   Encounter Date: 05/01/2021   PT End of Session - 05/01/21 0916     Visit Number 5    PT Start Time 0845    PT Stop Time 0940    PT Time Calculation (min) 55 min             Past Medical History:  Diagnosis Date   Arthritis    Asthma    CAD in native artery 08/15/2020   Chickenpox    Colon polyps 2010   Epistaxis 03/29/2021   Heart murmur    History of fainting spells of unknown cause    Hyperlipidemia    Hypertension    Sarcoid 06/16/1976    Past Surgical History:  Procedure Laterality Date   ABDOMINAL HYSTERECTOMY  1987   BASAL CELL CARCINOMA EXCISION  06/30/2020   nose   LUNG BIOPSY  06/16/1976   PERIPHERAL VASCULAR INTERVENTION Right 11/29/2020   Procedure: PERIPHERAL VASCULAR INTERVENTION;  Surgeon: Wellington Hampshire, MD;  Location: [facility];  Service: Cardiovascular;  Laterality: Right;  Renal Artery   RENAL ANGIOGRAPHY N/A 11/29/2020   Procedure: RENAL ANGIOGRAPHY;  Surgeon: Wellington Hampshire, MD;  Location: [facility];  Service: Cardiovascular;  Laterality: N/A;    There were no vitals filed for this visit.   Subjective Assessment - 05/01/21 0849     Subjective slept a couple hours after last session. last 3 days ache in afternoon, trying to be aware of posture. heating pad 3 x a day. tried a couple things at gym    Currently in Pain? No/denies                               [facility] Adult PT Treatment/Exercise - 05/01/21 0001       Neck Exercises: Machines for Strengthening   UBE (Upper Arm Bike) L 2 2 min fwd/2 min back    Cybex Row 15# 2 sets 10    Cybex Chest Press 5# 2 sets 10    Lat Pull 15# 2 sets 10    Other Machines  for Strengthening trunk flex and ext with tband      Neck Exercises: Standing   Other Standing Exercises wall angels 2 sets 5   4# shruggs,backward rolls and shld ext 10 x 2 sets   Other Standing Exercises ball vs wall 5 x CW and CCW      Shoulder Exercises: Standing   Other Standing Exercises 4# bicep 2 sets 10      Modalities   Modalities Electrical Stimulation;Moist Heat      Moist Heat Therapy   Number Minutes Moist Heat 15 Minutes    Moist Heat Location Cervical      Electrical Stimulation   Electrical Stimulation Location rhom and UT    Electrical Stimulation Action IFC    Electrical Stimulation Parameters prone    Electrical Stimulation Goals Pain                       PT Short Term Goals - 04/17/21 1734       PT SHORT TERM GOAL #1   Title independent with initial HEP  Time 3    Period Weeks    Status New               PT Long Term Goals - 04/26/21 0907       PT LONG TERM GOAL #1   Title independent with advanced HEP    Baseline advanced to gym    Status Partially Met      PT LONG TERM GOAL #2   Title decreased pain 50%    Status Achieved      PT LONG TERM GOAL #3   Title Understand posture and body mechanics    Status Partially Met      PT LONG TERM GOAL #4   Title Increase cervical ROM 25%    Status On-going      PT LONG TERM GOAL #5   Title return to the gym safely    Baseline given ex to resume gym ex    Status Partially Met                   Plan - 05/01/21 0917     Clinical Impression Statement continue to work on postural strengtheing ex with cuing and education    PT Next Visit Plan assess and progress             Patient will benefit from skilled therapeutic intervention in order to improve the following deficits and impairments:     Visit Diagnosis: Muscle weakness (generalized)  Pain in thoracic spine  Muscle spasm of back     Problem List Patient Active Problem List   Diagnosis Date  Noted   Snoring 04/13/2021   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   Epistaxis 03/29/2021   CAD in native artery 08/15/2020   Basal cell carcinoma 06/30/2020   History of loop recorder 12/13/2019   Allergic rhinitis 12/08/2019   Cough 11/08/2019   Post-nasal drip 11/08/2019   Multiple joint pain 04/22/2019   Bilateral leg edema 04/22/2019   Vaginal atrophy 01/05/2019   Resting tremor 09/08/2018   Sarcoidosis 05/08/2018   HTN (hypertension) 05/08/2018   Colon polyp 05/08/2018   Family hx of colon cancer 05/08/2018   Cystocele, midline 05/08/2018   Hyperlipidemia 05/08/2018   Vasovagal syncope 05/08/2018   Seborrheic keratosis 05/08/2018   Pseudophakia of both eyes 05/08/2018   GERD (gastroesophageal reflux disease) 05/08/2018   COPD (chronic obstructive pulmonary disease) ([place]) 05/08/2018    Janet Decesare,ANGIE, PTA 05/01/2021, 9:18 AM  [place]. [place], [place], [address] Phone: [phone]   Fax:  [phone]  Name: ZAYLYNN RICKETT MRN: [id number] Date of Birth: 11-09-1945

## 2021-05-02 ENCOUNTER — Encounter: Payer: Medicare Other | Admitting: Physical Therapy

## 2021-05-03 ENCOUNTER — Encounter: Payer: Self-pay | Admitting: Physical Therapy

## 2021-05-03 ENCOUNTER — Other Ambulatory Visit: Payer: Self-pay

## 2021-05-03 ENCOUNTER — Ambulatory Visit: Payer: Medicare Other | Admitting: Physical Therapy

## 2021-05-03 DIAGNOSIS — R278 Other lack of coordination: Secondary | ICD-10-CM

## 2021-05-03 DIAGNOSIS — M6281 Muscle weakness (generalized): Secondary | ICD-10-CM

## 2021-05-03 DIAGNOSIS — M546 Pain in thoracic spine: Secondary | ICD-10-CM

## 2021-05-03 DIAGNOSIS — M62830 Muscle spasm of back: Secondary | ICD-10-CM

## 2021-05-03 NOTE — Therapy (Signed)
[place]. [place], [place], [address] Phone: [phone]   Fax:  [phone]  Physical Therapy Treatment  Patient Details  Name: Kristen Suarez MRN: [id number] Date of Birth: 26-Dec-1945 Referring Provider (PT): Marikay Alar Date: 05/03/2021   PT End of Session - 05/03/21 1611     Visit Number 6    Date for PT Re-Evaluation 07/15/21    PT Start Time 1532    PT Stop Time 1612    PT Time Calculation (min) 40 min    Activity Tolerance Patient tolerated treatment well    Behavior During Therapy [facility] for tasks assessed/performed             Past Medical History:  Diagnosis Date   Arthritis    Asthma    CAD in native artery 08/15/2020   Chickenpox    Colon polyps 2010   Epistaxis 03/29/2021   Heart murmur    History of fainting spells of unknown cause    Hyperlipidemia    Hypertension    Sarcoid 06/16/1976    Past Surgical History:  Procedure Laterality Date   ABDOMINAL HYSTERECTOMY  1987   BASAL CELL CARCINOMA EXCISION  06/30/2020   nose   LUNG BIOPSY  06/16/1976   PERIPHERAL VASCULAR INTERVENTION Right 11/29/2020   Procedure: PERIPHERAL VASCULAR INTERVENTION;  Surgeon: Wellington Hampshire, MD;  Location: [facility];  Service: Cardiovascular;  Laterality: Right;  Renal Artery   RENAL ANGIOGRAPHY N/A 11/29/2020   Procedure: RENAL ANGIOGRAPHY;  Surgeon: Wellington Hampshire, MD;  Location: [facility];  Service: Cardiovascular;  Laterality: N/A;    There were no vitals filed for this visit.   Subjective Assessment - 05/03/21 1530     Subjective Patient reports she is starting feel more like herself, but has a cramp in her L shoulder blade. Her husband has been rubbing it with Voltaren. She has returned to the gym and performed the exercises recommended in her last PT session without trouble. She feels overall she is doing better.    Pertinent History sarcoid    Currently in Pain? Yes     Pain Score 5     Pain Location Scapula    Pain Orientation Left    Pain Descriptors / Indicators Tightness;Sore;Cramping    Pain Type Acute pain    Pain Onset More than a month ago    Pain Frequency Constant    Pain Relieving Factors Voltaren, massage, stretch                               [facility] Adult PT Treatment/Exercise - 05/03/21 0001       Neck Exercises: Machines for Strengthening   UBE (Upper Arm Bike) L 2 2 min fwd/2 min back      Neck Exercises: Seated   Neck Retraction 10 reps;5 secs      Neck Exercises: Stretches   Other Neck Stretches Stretched scalenes, SCM, UT, emphasized L. 3 x 20 second holds.      Manual Therapy   Manual Therapy Soft tissue mobilization    Soft tissue mobilization Scalenes, SCM, UT, rhomboids, all on L, trigger point therapy.                     PT Education - 05/03/21 1610     Education Details Updated HEP for stretching ant cervical muscles.  Person(s) Educated Patient    Methods Explanation;Handout;Demonstration    Comprehension Verbalized understanding;Returned demonstration              PT Short Term Goals - 05/03/21 1615       PT SHORT TERM GOAL #1   Title independent with initial HEP    Baseline Program updated today.    Time 1    Period Weeks    Status On-going    Target Date 07/12/20               PT Long Term Goals - 04/26/21 0907       PT LONG TERM GOAL #1   Title independent with advanced HEP    Baseline advanced to gym    Status Partially Met      PT LONG TERM GOAL #2   Title decreased pain 50%    Status Achieved      PT LONG TERM GOAL #3   Title Understand posture and body mechanics    Status Partially Met      PT LONG TERM GOAL #4   Title Increase cervical ROM 25%    Status On-going      PT LONG TERM GOAL #5   Title return to the gym safely    Baseline given ex to resume gym ex    Status Partially Met                   Plan - 05/03/21  1612     Clinical Impression Statement Patient reports overall improvement, but she did note increased pain in L medial/superior scapula. Therapist assessed ant cervical muscles, finding tightness and trigger points in L scalenes, SCM. Performed STM on musculature, then educated her in additional stretches to address the front of her neck. Instructed to incorporate these into her overall program. Finished with heat and e-stime.    Stability/Clinical Decision Making Stable/Uncomplicated    Clinical Decision Making Low    Rehab Potential Good    PT Frequency 2x / week    PT Duration Other (comment)   10w   PT Treatment/Interventions ADLs/Self Care Home Management;Electrical Stimulation;Cryotherapy;Moist Heat;Ultrasound;Neuromuscular re-education;Therapeutic exercise;Therapeutic activities;Functional mobility training;Stair training;Patient/family education;Manual techniques;Dry needling    PT Next Visit Plan assess and progress    PT Home Exercise Plan [id number]    Consulted and Agree with Plan of Care Patient             Patient will benefit from skilled therapeutic intervention in order to improve the following deficits and impairments:  Decreased range of motion, Increased muscle spasms, Decreased activity tolerance, Pain, Postural dysfunction, Decreased strength, Impaired flexibility  Visit Diagnosis: Muscle weakness (generalized)  Pain in thoracic spine  Muscle spasm of back  Other lack of coordination     Problem List Patient Active Problem List   Diagnosis Date Noted   Snoring 04/13/2021   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   Epistaxis 03/29/2021   CAD in native artery 08/15/2020   Basal cell carcinoma 06/30/2020   History of loop recorder 12/13/2019   Allergic rhinitis 12/08/2019   Cough 11/08/2019   Post-nasal drip 11/08/2019   Multiple joint pain 04/22/2019   Bilateral leg edema 04/22/2019   Vaginal atrophy 01/05/2019   Resting tremor 09/08/2018   Sarcoidosis  05/08/2018   HTN (hypertension) 05/08/2018   Colon polyp 05/08/2018   Family hx of colon cancer 05/08/2018   Cystocele, midline 05/08/2018   Hyperlipidemia 05/08/2018   Vasovagal syncope 05/08/2018   Seborrheic  keratosis 05/08/2018   Pseudophakia of both eyes 05/08/2018   GERD (gastroesophageal reflux disease) 05/08/2018   COPD (chronic obstructive pulmonary disease) ([place]) 05/08/2018    Marcelina Morel, DPT 05/03/2021, 4:18 PM  [place]. [place], [place], [address] Phone: [phone]   Fax:  [phone]  Name: SUBRINA VECCHIARELLI MRN: [id number] Date of Birth: [birth year]

## 2021-05-03 NOTE — Patient Instructions (Signed)
Access Code: [email or web address] URL: [email or web address] Date: 05/03/2021 Prepared by: Ethel Rana  Exercises Supine Anterior Scalene Stretch - 1 x daily - 7 x weekly - 3 sets - 15 hold Seated Upper Trapezius Stretch - 1 x daily - 7 x weekly - 3 sets - 15 hold Sternocleidomastoid Stretch - 1 x daily - 7 x weekly - 3 sets - 15 hold

## 2021-05-06 ENCOUNTER — Encounter (HOSPITAL_BASED_OUTPATIENT_CLINIC_OR_DEPARTMENT_OTHER): Payer: Self-pay | Admitting: Cardiovascular Disease

## 2021-05-07 ENCOUNTER — Encounter: Payer: Medicare Other | Admitting: Physical Therapy

## 2021-05-08 ENCOUNTER — Other Ambulatory Visit: Payer: Self-pay

## 2021-05-08 ENCOUNTER — Encounter: Payer: Self-pay | Admitting: Physical Therapy

## 2021-05-08 ENCOUNTER — Ambulatory Visit: Payer: Medicare Other | Admitting: Physical Therapy

## 2021-05-08 DIAGNOSIS — R278 Other lack of coordination: Secondary | ICD-10-CM

## 2021-05-08 DIAGNOSIS — M6281 Muscle weakness (generalized): Secondary | ICD-10-CM

## 2021-05-08 DIAGNOSIS — M546 Pain in thoracic spine: Secondary | ICD-10-CM

## 2021-05-08 DIAGNOSIS — M62830 Muscle spasm of back: Secondary | ICD-10-CM

## 2021-05-08 NOTE — Therapy (Signed)
[place]. [place], [place], [address] Phone: [phone]   Fax:  [phone]  Physical Therapy Treatment  Patient Details  Name: Kristen Suarez MRN: [id number] Date of Birth: Feb 10, 1946 Referring Provider (PT): Georgina Snell   Encounter Date: 05/08/2021   PT End of Session - 05/08/21 1022     Visit Number 7    Date for PT Re-Evaluation 07/15/21    Authorization Type [employer]    PT Start Time 4270    PT Stop Time 6237    PT Time Calculation (min) 40 min    Activity Tolerance Patient tolerated treatment well    Behavior During Therapy [facility] for tasks assessed/performed             Past Medical History:  Diagnosis Date   Arthritis    Asthma    CAD in native artery 08/15/2020   Chickenpox    Colon polyps 2010   Epistaxis 03/29/2021   Heart murmur    History of fainting spells of unknown cause    Hyperlipidemia    Hypertension    Sarcoid 06/16/1976    Past Surgical History:  Procedure Laterality Date   ABDOMINAL HYSTERECTOMY  1987   BASAL CELL CARCINOMA EXCISION  06/30/2020   nose   LUNG BIOPSY  06/16/1976   PERIPHERAL VASCULAR INTERVENTION Right 11/29/2020   Procedure: PERIPHERAL VASCULAR INTERVENTION;  Surgeon: Wellington Hampshire, MD;  Location: [facility];  Service: Cardiovascular;  Laterality: Right;  Renal Artery   RENAL ANGIOGRAPHY N/A 11/29/2020   Procedure: RENAL ANGIOGRAPHY;  Surgeon: Wellington Hampshire, MD;  Location: [facility];  Service: Cardiovascular;  Laterality: N/A;    There were no vitals filed for this visit.   Subjective Assessment - 05/08/21 0936     Subjective I've noticed that my pain is the worst on weekends, I've noticed that it gets more painful where I don't come here or to [place]. Left shoulder blade continues to be painful and tight. I do the nustep or recumbent bike, usually chest press and back extension, I also cleared some more upper body exercises like lat  pulls, rows, and one more I can't remember at the moment.    Pertinent History sarcoid    Patient Stated Goals have less pain    Currently in Pain? No/denies                               [facility] Adult PT Treatment/Exercise - 05/08/21 0001       Neck Exercises: Seated   Neck Retraction 10 reps;5 secs    Cervical Rotation 10 reps;Both    Cervical Rotation Limitations with chin tuck    Lateral Flexion 10 reps;Both    Lateral Flexion Limitations with chin tuck    Shoulder Rolls 15 reps;Backwards    Other Seated Exercise thoracic extensionx10, thoracic rotation x10    Other Seated Exercise scapular retractions 1x15      Neck Exercises: Supine   Other Supine Exercise supine stretch over towel roll x2 min, progressed to UE flexion over towel roll x15 for improve thoracic mobility      Neck Exercises: Stretches   Other Neck Stretches pec stretch in doorway 2x30 seconds      Manual Therapy   Manual Therapy Soft tissue mobilization;Joint mobilization    Manual therapy comments IASTM B rhomboids    Soft tissue mobilization grade II PAs  to thoracic spine x3 rounds tolerated well                     PT Education - 05/08/21 1021     Education Details benefit of STM/joint mobs, how to manage any related sorness that may occur over next couple days    Person(s) Educated Patient    Methods Explanation    Comprehension Verbalized understanding              PT Short Term Goals - 05/03/21 1615       PT SHORT TERM GOAL #1   Title independent with initial HEP    Baseline Program updated today.    Time 1    Period Weeks    Status On-going    Target Date 07/12/20               PT Long Term Goals - 04/26/21 0907       PT LONG TERM GOAL #1   Title independent with advanced HEP    Baseline advanced to gym    Status Partially Met      PT LONG TERM GOAL #2   Title decreased pain 50%    Status Achieved      PT LONG TERM GOAL #3   Title  Understand posture and body mechanics    Status Partially Met      PT LONG TERM GOAL #4   Title Increase cervical ROM 25%    Status On-going      PT LONG TERM GOAL #5   Title return to the gym safely    Baseline given ex to resume gym ex    Status Partially Met                   Plan - 05/08/21 1022     Clinical Impression Statement [name] arrives today reporting weekends are hardest for her pain wise, otherwise she is doing OK. Spent some time focusing on joint mobs and STM to thoracic region today, introduced some new exercises for thoracic mobility as well. Otherwise continued working on functional postural training and strengthening today. Seemed to tolerate everything well, will continue to progress as tolerated.    Stability/Clinical Decision Making Stable/Uncomplicated    Clinical Decision Making Low    Rehab Potential Good    PT Frequency 2x / week    PT Duration Other (comment)    PT Treatment/Interventions ADLs/Self Care Home Management;Electrical Stimulation;Cryotherapy;Moist Heat;Ultrasound;Neuromuscular re-education;Therapeutic exercise;Therapeutic activities;Functional mobility training;Stair training;Patient/family education;Manual techniques;Dry needling    PT Next Visit Plan assess and progress- add supine thoracic extension stretches, pec stretch to HEP  if she tolerated well    PT Home Exercise Plan [email or web address]    Consulted and Agree with Plan of Care Patient             Patient will benefit from skilled therapeutic intervention in order to improve the following deficits and impairments:  Decreased range of motion, Increased muscle spasms, Decreased activity tolerance, Pain, Postural dysfunction, Decreased strength, Impaired flexibility  Visit Diagnosis: Muscle weakness (generalized)  Pain in thoracic spine  Muscle spasm of back  Other lack of coordination     Problem List Patient Active Problem List   Diagnosis Date Noted   Snoring  04/13/2021   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   Epistaxis 03/29/2021   CAD in native artery 08/15/2020   Basal cell carcinoma 06/30/2020   History of loop recorder 12/13/2019   Allergic rhinitis  12/08/2019   Cough 11/08/2019   Post-nasal drip 11/08/2019   Multiple joint pain 04/22/2019   Bilateral leg edema 04/22/2019   Vaginal atrophy 01/05/2019   Resting tremor 09/08/2018   Sarcoidosis 05/08/2018   HTN (hypertension) 05/08/2018   Colon polyp 05/08/2018   Family hx of colon cancer 05/08/2018   Cystocele, midline 05/08/2018   Hyperlipidemia 05/08/2018   Vasovagal syncope 05/08/2018   Seborrheic keratosis 05/08/2018   Pseudophakia of both eyes 05/08/2018   GERD (gastroesophageal reflux disease) 05/08/2018   COPD (chronic obstructive pulmonary disease) ([place]) 05/08/2018   Ann Lions PT, DPT, PN2   [facility] Therapist [place]. [place], [place], [address] Phone: [phone]   Fax:  [phone]  Name: Kristen Suarez MRN: [id number] Date of Birth: 1945/11/02

## 2021-05-10 ENCOUNTER — Encounter: Payer: Self-pay | Admitting: Physical Therapy

## 2021-05-10 ENCOUNTER — Ambulatory Visit: Payer: Medicare Other | Admitting: Physical Therapy

## 2021-05-10 ENCOUNTER — Other Ambulatory Visit: Payer: Self-pay

## 2021-05-10 DIAGNOSIS — M6281 Muscle weakness (generalized): Secondary | ICD-10-CM | POA: Diagnosis not present

## 2021-05-10 DIAGNOSIS — R278 Other lack of coordination: Secondary | ICD-10-CM

## 2021-05-10 DIAGNOSIS — M62830 Muscle spasm of back: Secondary | ICD-10-CM

## 2021-05-10 DIAGNOSIS — M546 Pain in thoracic spine: Secondary | ICD-10-CM

## 2021-05-10 NOTE — Therapy (Signed)
[place] [place], [place], [address] Phone: [phone]   Fax:  [phone]  Physical Therapy Treatment  Patient Details  Name: Kristen Suarez MRN: [id number] Date of Birth: 07-17-1945 Referring Provider (PT): Marikay Alar Date: 05/10/2021   PT End of Session - 05/10/21 0928     Visit Number 8    Date for PT Re-Evaluation 07/15/21    Authorization Type [employer]    PT Start Time 0847    PT Stop Time 0929    PT Time Calculation (min) 42 min    Activity Tolerance Patient tolerated treatment well    Behavior During Therapy [facility] for tasks assessed/performed             Past Medical History:  Diagnosis Date   Arthritis    Asthma    CAD in native artery 08/15/2020   Chickenpox    Colon polyps 2010   Epistaxis 03/29/2021   Heart murmur    History of fainting spells of unknown cause    Hyperlipidemia    Hypertension    Sarcoid 06/16/1976    Past Surgical History:  Procedure Laterality Date   ABDOMINAL HYSTERECTOMY  1987   BASAL CELL CARCINOMA EXCISION  06/30/2020   nose   LUNG BIOPSY  06/16/1976   PERIPHERAL VASCULAR INTERVENTION Right 11/29/2020   Procedure: PERIPHERAL VASCULAR INTERVENTION;  Surgeon: Wellington Hampshire, MD;  Location: [facility];  Service: Cardiovascular;  Laterality: Right;  Renal Artery   RENAL ANGIOGRAPHY N/A 11/29/2020   Procedure: RENAL ANGIOGRAPHY;  Surgeon: Wellington Hampshire, MD;  Location: [facility];  Service: Cardiovascular;  Laterality: N/A;    There were no vitals filed for this visit.   Subjective Assessment - 05/10/21 0849     Subjective I had a LOT of soreness the day after we tried the joint mobs, but the next day was fine. Yesterday a little bit of a low backache started its still here. Neck is a little sore, L shoulder blade is also a bit sore. Spasms in the back are better since you worked on them.    Pertinent History sarcoid    Patient  Stated Goals have less pain    Currently in Pain? Yes    Pain Score 1     Pain Location Other (Comment)   L shoulder blade, low back   Pain Orientation Other (Comment)   low back and L scapula   Pain Descriptors / Indicators Aching;Sore    Pain Type Acute pain                               [facility] Adult PT Treatment/Exercise - 05/10/21 0001       Neck Exercises: Machines for Strengthening   UBE (Upper Arm Bike) L2.5 3 min forward/3 min back    Cybex Row 1x5 20#    Lat Pull 1x5 20#    Other Machines for Strengthening shoulder extension 1x5 10#      Neck Exercises: Standing   Other Standing Exercises wall angels with chin tuck and scap retraction 10 x10 second holds;      Neck Exercises: Seated   Neck Retraction 10 reps;5 secs    Shoulder Rolls Backwards;20 reps      Neck Exercises: Stretches   Other Neck Stretches single arm (less aggressive) pec stretch at door way 2x30 seconds B  Manual Therapy   Manual Therapy Joint mobilization;Soft tissue mobilization    Manual therapy comments IASTM B rhomboids    Soft tissue mobilization grade I-II PAs to thoracic spine x4 rounds                     PT Education - 05/10/21 0928     Education Details transient soreness after first couple rounds of joint mobs is expected, continue to monitor and management strategies    Person(s) Educated Patient    Methods Explanation    Comprehension Verbalized understanding              PT Short Term Goals - 05/03/21 1615       PT SHORT TERM GOAL #1   Title independent with initial HEP    Baseline Program updated today.    Time 1    Period Weeks    Status On-going    Target Date 07/12/20               PT Long Term Goals - 04/26/21 0907       PT LONG TERM GOAL #1   Title independent with advanced HEP    Baseline advanced to gym    Status Partially Met      PT LONG TERM GOAL #2   Title decreased pain 50%    Status Achieved      PT  LONG TERM GOAL #3   Title Understand posture and body mechanics    Status Partially Met      PT LONG TERM GOAL #4   Title Increase cervical ROM 25%    Status On-going      PT LONG TERM GOAL #5   Title return to the gym safely    Baseline given ex to resume gym ex    Status Partially Met                   Plan - 05/10/21 0929     Clinical Impression Statement Ms. Pfiester arrives today doing well, she did have a lot of temporary discomfort and aching in her thoracic spine after we introduced joint mobs- educated again that this was expected, she reports she did feel better after this soreness gradually resolved. We tried another round of joint mobs again today, noted somewhat of an improvement in joint play but upper thoracic spine is still quite stiff. Thoracic paraspinals were much more pliable and less tight today, however. Otherwise we focused on general postural strengthening and postural activities; seemed to tolerate well but she does have some ongoing pec tightness that seems a bit irritable especially at distal attachment points. Will continue efforts.    Stability/Clinical Decision Making Stable/Uncomplicated    Clinical Decision Making Low    Rehab Potential Good    PT Frequency 2x / week    PT Duration Other (comment)    PT Treatment/Interventions ADLs/Self Care Home Management;Electrical Stimulation;Cryotherapy;Moist Heat;Ultrasound;Neuromuscular re-education;Therapeutic exercise;Therapeutic activities;Functional mobility training;Stair training;Patient/family education;Manual techniques;Dry needling    PT Next Visit Plan assess and progress- add supine thoracic extension stretches, pec stretch to HEP  if she tolerated well    PT Home Exercise Plan [id number]    Consulted and Agree with Plan of Care Patient             Patient will benefit from skilled therapeutic intervention in order to improve the following deficits and impairments:  Decreased range of motion,  Increased muscle spasms, Decreased activity tolerance, Pain, Postural  dysfunction, Decreased strength, Impaired flexibility  Visit Diagnosis: Muscle weakness (generalized)  Pain in thoracic spine  Muscle spasm of back  Other lack of coordination     Problem List Patient Active Problem List   Diagnosis Date Noted   Snoring 04/13/2021   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   Epistaxis 03/29/2021   CAD in native artery 08/15/2020   Basal cell carcinoma 06/30/2020   History of loop recorder 12/13/2019   Allergic rhinitis 12/08/2019   Cough 11/08/2019   Post-nasal drip 11/08/2019   Multiple joint pain 04/22/2019   Bilateral leg edema 04/22/2019   Vaginal atrophy 01/05/2019   Resting tremor 09/08/2018   Sarcoidosis 05/08/2018   HTN (hypertension) 05/08/2018   Colon polyp 05/08/2018   Family hx of colon cancer 05/08/2018   Cystocele, midline 05/08/2018   Hyperlipidemia 05/08/2018   Vasovagal syncope 05/08/2018   Seborrheic keratosis 05/08/2018   Pseudophakia of both eyes 05/08/2018   GERD (gastroesophageal reflux disease) 05/08/2018   COPD (chronic obstructive pulmonary disease) ([place]) 05/08/2018   Ann Lions PT, DPT, PN2   Supplemental Physical Therapist [place]. [place], [place], [address] Phone: [phone]   Fax:  [phone]  Name: Kristen Suarez MRN: [id number] Date of Birth: 03/17/1946

## 2021-05-13 ENCOUNTER — Encounter: Payer: Self-pay | Admitting: Family Medicine

## 2021-05-14 ENCOUNTER — Other Ambulatory Visit: Payer: Self-pay

## 2021-05-14 ENCOUNTER — Ambulatory Visit: Payer: Self-pay

## 2021-05-14 ENCOUNTER — Ambulatory Visit (INDEPENDENT_AMBULATORY_CARE_PROVIDER_SITE_OTHER): Payer: Medicare Other | Admitting: Family Medicine

## 2021-05-14 ENCOUNTER — Ambulatory Visit (INDEPENDENT_AMBULATORY_CARE_PROVIDER_SITE_OTHER): Payer: Medicare Other

## 2021-05-14 VITALS — BP 142/86 | HR 79 | Ht 63.0 in | Wt 146.8 lb

## 2021-05-14 DIAGNOSIS — G8929 Other chronic pain: Secondary | ICD-10-CM

## 2021-05-14 DIAGNOSIS — M25561 Pain in right knee: Secondary | ICD-10-CM

## 2021-05-14 NOTE — Progress Notes (Signed)
I, [name], LAT, ATC acting as a scribe for [name], MD.  Kristen Suarez is a 76 y.o. female who presents to [facility] today for R knee pain. Pt was previously seen by Dr. Georgina Snell on 04/16/21 for T-spine pain and was referred to PT, completing 8 visits. Pt sent a MyChart message on 05/13/21 reporting increased pain in her R knee. Pt notes increased R knee pain over the weekend. Pt locates pain to medial aspect of the R knee  She has been seeing PT for her back which she notes is feeling a lot better.  She has 2 more physical therapy sessions scheduled.  R knee swelling: no Mechanical symptoms: no Aggravates: transitioning to stand, walking Treatments tried: rest, IBU, ointments, heat   Pertinent review of systems: No fevers or chills  Relevant historical information: COPD.  CAD.  Hypertension.  Sarcoidosis   Exam:  BP (!) 142/86    Pulse 79    Ht 5\' 3"  (1.6 m)    Wt 146 lb 12.8 oz (66.6 kg)    SpO2 97%    BMI 26.00 kg/m  General: Well Developed, well nourished, and in no acute distress.   MSK: Right knee decreased quad bulk right compared to left.  Otherwise normal. Normal motion. Tender palpation medial joint line. Stable ligamentous exam. Intact strength. Mildly positive medial McMurray's test.    Lab and Radiology Results  Procedure: Real-time Ultrasound Guided Injection of right knee superior lateral patellar space Device: Philips Affiniti 50G Images permanently stored and available for review in PACS Ultrasound evaluation prior to injection reveals degenerative medial joint line. Verbal informed consent obtained.  Discussed risks and benefits of procedure. Warned about infection bleeding damage to structures skin hypopigmentation and fat atrophy among others. Patient expresses understanding and agreement Time-out conducted.   Noted no overlying erythema, induration, or other signs of local infection.   Skin prepped in a  sterile fashion.   Local anesthesia: Topical Ethyl chloride.   With sterile technique and under real time ultrasound guidance: 40 mg of Kenalog and 2 mL of Marcaine injected into knee joint. Fluid seen entering the joint capsule.   Completed without difficulty   Pain immediately resolved suggesting accurate placement of the medication.   Advised to call if fevers/chills, erythema, induration, drainage, or persistent bleeding.   Images permanently stored and available for review in the ultrasound unit.  Impression: Technically successful ultrasound guided injection.    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  Mild patellofemoral DJD.  No acute fractures. Await formal radiology review    Assessment and Plan: 76 y.o. female with right knee pain thought to be due to DJD or perhaps degenerative medial meniscus tear.  Plan for steroid injection and work on quad strength with physical therapy.  Recommend also Voltaren gel.  Patient is scheduled to see me on the 14th mostly for her back.  We can delay that appointment for about a month and check back then.  Her back is feeling better.   PDMP not reviewed this encounter. Orders Placed This Encounter  Procedures   [place] LIMITED JOINT SPACE STRUCTURES LOW RIGHT(NO LINKED CHARGES)    Standing Status:   Future    Number of Occurrences:   1    Standing Expiration Date:   11/11/2021    Order Specific Question:   Reason for Exam (SYMPTOM  OR DIAGNOSIS REQUIRED)  Answer:   right knee pain    Order Specific Question:   Preferred imaging location?    Answer:   [facility]   DG Knee AP/LAT W/[place] Right    Standing Status:   Future    Standing Expiration Date:   05/14/2022    Order Specific Question:   Reason for Exam (SYMPTOM  OR DIAGNOSIS REQUIRED)    Answer:   right knee pain    Order Specific Question:   Preferred imaging location?     Answer:   [facility]   Ambulatory referral to Physical Therapy    Referral Priority:   Routine    Referral Type:   Physical Medicine    Referral Reason:   Specialty Services Required    Requested Specialty:   Physical Therapy    Number of Visits Requested:   1   No orders of the defined types were placed in this encounter.    Discussed warning signs or symptoms. Please see discharge instructions. Patient expresses understanding.   The above documentation has been reviewed and is accurate and complete [name], M.D.

## 2021-05-14 NOTE — Patient Instructions (Addendum)
Thank you for coming in today.   Please use Voltaren gel (Generic Diclofenac Gel) up to 4x daily for pain as needed.  This is available over-the-counter as both the name brand Voltaren gel and the generic diclofenac gel.   You received a steroid injection in your right knee today. Seek immediate medical attention if the joint becomes red, extremely painful, or is oozing fluid.   Please get an Xray today before you leave   Adding treating your knee to the physical therapy order  Reschedule the existing visit with me for 1 month from now

## 2021-05-15 ENCOUNTER — Ambulatory Visit: Payer: Medicare Other | Admitting: Physical Therapy

## 2021-05-15 ENCOUNTER — Encounter: Payer: Self-pay | Admitting: Nurse Practitioner

## 2021-05-15 ENCOUNTER — Encounter: Payer: Medicare Other | Admitting: Physical Therapy

## 2021-05-15 DIAGNOSIS — M6281 Muscle weakness (generalized): Secondary | ICD-10-CM

## 2021-05-15 DIAGNOSIS — M546 Pain in thoracic spine: Secondary | ICD-10-CM

## 2021-05-15 NOTE — Therapy (Signed)
[place]. [place], [place], [address] Phone: [phone]   Fax:  [phone]  Physical Therapy Treatment  Patient Details  Name: Kristen Suarez MRN: [id number] Date of Birth: Jan 27, 1946 Referring Provider (PT): Georgina Snell   Encounter Date: 05/15/2021   PT End of Session - 05/15/21 0850     Visit Number 9    Date for PT Re-Evaluation 07/15/21    Authorization Type [employer]    PT Start Time 0845    PT Stop Time 0925    PT Time Calculation (min) 40 min             Past Medical History:  Diagnosis Date   Arthritis    Asthma    CAD in native artery 08/15/2020   Chickenpox    Colon polyps 2010   Epistaxis 03/29/2021   Heart murmur    History of fainting spells of unknown cause    Hyperlipidemia    Hypertension    Sarcoid 06/16/1976    Past Surgical History:  Procedure Laterality Date   ABDOMINAL HYSTERECTOMY  1987   BASAL CELL CARCINOMA EXCISION  06/30/2020   nose   LUNG BIOPSY  06/16/1976   PERIPHERAL VASCULAR INTERVENTION Right 11/29/2020   Procedure: PERIPHERAL VASCULAR INTERVENTION;  Surgeon: Wellington Hampshire, MD;  Location: [facility];  Service: Cardiovascular;  Laterality: Right;  Renal Artery   RENAL ANGIOGRAPHY N/A 11/29/2020   Procedure: RENAL ANGIOGRAPHY;  Surgeon: Wellington Hampshire, MD;  Location: [facility];  Service: Cardiovascular;  Laterality: N/A;    There were no vitals filed for this visit.   Subjective Assessment - 05/15/21 0848     Subjective doing better, overall 75%. strength is better and incorporating in ex at home and gym.now eval for knee.    Currently in Pain? No/denies                               [facility] Adult PT Treatment/Exercise - 05/15/21 0001       Neck Exercises: Machines for Strengthening   UBE (Upper Arm Bike) L 3 3 min fwd/ 3 min backward      Neck Exercises: Supine   Neck Retraction 10 reps;3 secs    Upper Extremity D1  Flexion;10 reps   wt ball     Shoulder Exercises: Supine   Horizontal ABduction Strengthening;Both;10 reps   ON towle roll on spine to open up   Horizontal ABduction Weight (lbs) 2    Flexion Strengthening;Both;10 reps   on towel roll on spine   Shoulder Flexion Weight (lbs) 2    ABduction Strengthening;Both;10 reps   on towel roll   Other Supine Exercises chest press with serratus on towel roll 2#      Shoulder Exercises: Standing   Other Standing Exercises yellow tband 3 way scap stab on wall 10 x each    Other Standing Exercises 1# 4 pt rhy stab 10 x each                       PT Short Term Goals - 05/03/21 1615       PT SHORT TERM GOAL #1   Title independent with initial HEP    Baseline Program updated today.    Time 1    Period Weeks    Status On-going    Target Date 07/12/20  PT Long Term Goals - 04/26/21 0907       PT LONG TERM GOAL #1   Title independent with advanced HEP    Baseline advanced to gym    Status Partially Met      PT LONG TERM GOAL #2   Title decreased pain 50%    Status Achieved      PT LONG TERM GOAL #3   Title Understand posture and body mechanics    Status Partially Met      PT LONG TERM GOAL #4   Title Increase cervical ROM 25%    Status On-going      PT LONG TERM GOAL #5   Title return to the gym safely    Baseline given ex to resume gym ex    Status Partially Met                   Plan - 05/15/21 0851     Clinical Impression Statement pt arrived verb 75 better overall . saw MD yesterday for knee pain and now script for knee. will need to assess cerv goals and liittaions and decide if we D/C and focus on knee or do both    PT Treatment/Interventions ADLs/Self Care Home Management;Electrical Stimulation;Cryotherapy;Moist Heat;Ultrasound;Neuromuscular re-education;Therapeutic exercise;Therapeutic activities;Functional mobility training;Stair training;Patient/family education;Manual  techniques;Dry needling    PT Next Visit Plan asess GOALS- do we continue or D/C with original diagnosis? AS we now have KNEE EVAL             Patient will benefit from skilled therapeutic intervention in order to improve the following deficits and impairments:  Decreased range of motion, Increased muscle spasms, Decreased activity tolerance, Pain, Postural dysfunction, Decreased strength, Impaired flexibility  Visit Diagnosis: Muscle weakness (generalized)  Pain in thoracic spine     Problem List Patient Active Problem List   Diagnosis Date Noted   Snoring 04/13/2021   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   Epistaxis 03/29/2021   CAD in native artery 08/15/2020   Basal cell carcinoma 06/30/2020   History of loop recorder 12/13/2019   Allergic rhinitis 12/08/2019   Cough 11/08/2019   Post-nasal drip 11/08/2019   Multiple joint pain 04/22/2019   Bilateral leg edema 04/22/2019   Vaginal atrophy 01/05/2019   Resting tremor 09/08/2018   Sarcoidosis 05/08/2018   HTN (hypertension) 05/08/2018   Colon polyp 05/08/2018   Family hx of colon cancer 05/08/2018   Cystocele, midline 05/08/2018   Hyperlipidemia 05/08/2018   Vasovagal syncope 05/08/2018   Seborrheic keratosis 05/08/2018   Pseudophakia of both eyes 05/08/2018   GERD (gastroesophageal reflux disease) 05/08/2018   COPD (chronic obstructive pulmonary disease) ([place]) 05/08/2018    Nason Conradt,ANGIE, PTA 05/15/2021, 9:23 AM  [place]. [place], [place], [address] Phone: [phone]   Fax:  [phone]  Name: Kristen Suarez MRN: [id number] Date of Birth: 1945-04-17

## 2021-05-16 NOTE — Progress Notes (Signed)
X-ray shows some mild knee arthritis.

## 2021-05-17 ENCOUNTER — Other Ambulatory Visit: Payer: Self-pay

## 2021-05-17 ENCOUNTER — Encounter: Payer: Self-pay | Admitting: Physical Therapy

## 2021-05-17 ENCOUNTER — Ambulatory Visit: Payer: Medicare Other | Attending: Family Medicine | Admitting: Physical Therapy

## 2021-05-17 DIAGNOSIS — M62830 Muscle spasm of back: Secondary | ICD-10-CM

## 2021-05-17 DIAGNOSIS — M546 Pain in thoracic spine: Secondary | ICD-10-CM

## 2021-05-17 DIAGNOSIS — M25561 Pain in right knee: Secondary | ICD-10-CM

## 2021-05-17 DIAGNOSIS — R29898 Other symptoms and signs involving the musculoskeletal system: Secondary | ICD-10-CM

## 2021-05-17 DIAGNOSIS — G8929 Other chronic pain: Secondary | ICD-10-CM | POA: Insufficient documentation

## 2021-05-17 DIAGNOSIS — M6281 Muscle weakness (generalized): Secondary | ICD-10-CM

## 2021-05-17 DIAGNOSIS — R278 Other lack of coordination: Secondary | ICD-10-CM

## 2021-05-17 NOTE — Therapy (Signed)
[facility] [address] [place], [place], [address] Phone: [phone]   Fax:  [phone]  Physical Therapy Evaluation (and 10th visit progress note)  Patient Details  Name: Kristen Suarez MRN: [id number] Date of Birth: Aug 28, 1945 Referring Provider (PT): Denyse Amass   Encounter Date: 05/17/2021  Progress Note Reporting Period 04/17/21 to 05/17/21  See note below for Objective Data and Assessment of Progress/Goals.       PT End of Session - 05/17/21 1119     Visit Number 10    Number of Visits 22    Date for PT Re-Evaluation 06/28/21    Authorization Type [employer]    Progress Note Due on Visit 20    PT Start Time 0848    PT Stop Time 0928    PT Time Calculation (min) 40 min    Activity Tolerance Patient tolerated treatment well    Behavior During Therapy [facility] for tasks assessed/performed             Past Medical History:  Diagnosis Date   Arthritis    Asthma    CAD in native artery 08/15/2020   Chickenpox    Colon polyps 2010   Epistaxis 03/29/2021   Heart murmur    History of fainting spells of unknown cause    Hyperlipidemia    Hypertension    Sarcoid 06/16/1976    Past Surgical History:  Procedure Laterality Date   ABDOMINAL HYSTERECTOMY  1987   BASAL CELL CARCINOMA EXCISION  06/30/2020   nose   LUNG BIOPSY  06/16/1976   PERIPHERAL VASCULAR INTERVENTION Right 11/29/2020   Procedure: PERIPHERAL VASCULAR INTERVENTION;  Surgeon: Iran Ouch, MD;  [facility];  Service: Cardiovascular;  Laterality: Right;  Renal Artery   RENAL ANGIOGRAPHY N/A 11/29/2020   Procedure: RENAL ANGIOGRAPHY;  Surgeon: Iran Ouch, MD;  Location: [facility];  Service: Cardiovascular;  Laterality: N/A;    There were no vitals filed for this visit.    Subjective Assessment - 05/17/21 0852     Subjective I am ok with moving on from my scapular pain to my knee as it is very bothersome. My knee  started bothering me in the fall but on Friday it swole up and hurt to the point I had to use a cane around the house. It was buckling on me. I didn't do anything out of the norm on Friday I can think of that would have started this. No ankle or hip issues I am aware of.    Pertinent History sarcoid    Patient Stated Goals get knee stronger so it doesn't hurt    Currently in Pain? No/denies   but can be 8-9/10 at worst, since cortisone shot down to 3-4/10 at worst   Pain Location Knee    Pain Orientation Right                [facility] PT Assessment - 05/17/21 0001       Assessment   Medical Diagnosis scapular pain and R knee pain    Referring Provider (PT) Denyse Amass    Onset Date/Surgical Date 06/26/21   last fall, got bad last weekend   Next MD Visit Denyse Amass April 11th    Prior Therapy PT here for scapular pain      Precautions   Precautions None      Restrictions   Weight Bearing Restrictions No      Balance Screen  Has the patient fallen in the past 6 months Yes    How many times? 3    Has the patient had a decrease in activity level because of a fear of falling?  No    Is the patient reluctant to leave their home because of a fear of falling?  No      Home [employer]ist information centre manager residence    Additional Comments single floor, does some hosuework      Prior Function   Level of Independence Independent    Vocation Retired    Leisure gym 3x/week, some classes and some machines      Observation/Other Assessments   Observations ligament testing appears negative; disco test negative but had trouble coordinating sequencing R LE; patella mobility looks OK      Strength   Strength Assessment Site Hip;Knee;Ankle    Right/Left Hip Right;Left    Right Hip Flexion 4-/5    Right Hip Extension 4-/5    Right Hip ABduction 4/5    Left Hip Flexion 4-/5    Left Hip Extension 4-/5    Left Hip ABduction 4/5    Right/Left Knee Right;Left    Right Knee Flexion 3/5     Right Knee Extension 4+/5    Left Knee Flexion 3/5    Left Knee Extension 4+/5    Right/Left Ankle Right;Left    Right Ankle Dorsiflexion 5/5    Right Ankle Plantar Flexion 2/5    Right Ankle Inversion 5/5    Right Ankle Eversion 5/5    Left Ankle Dorsiflexion 5/5    Left Ankle Plantar Flexion 2/5    Left Ankle Inversion 5/5    Left Ankle Eversion 5/5      Flexibility   Soft Tissue Assessment /Muscle Length yes    Hamstrings moderate limitation B, gastrocs moderately tight B    Quadriceps severe limitation B, moderate hip flexor tightness B; some crepitus noted R knee with testing    Piriformis mild limitation B      Palpation   Palpation comment some spasms noted in R quad, tenderness and maybe a pocket of edema slightly distal and R of patella                        Objective measurements completed on examination: See above findings.       [facility] Adult PT Treatment/Exercise - 05/17/21 0001       Exercises   Exercises Knee/Hip      Knee/Hip Exercises: Stretches   Active Hamstring Stretch Both;1 rep;30 seconds      Knee/Hip Exercises: Aerobic   Recumbent Bike seat 7 resistance 3 x6 minutes      Knee/Hip Exercises: Seated   Long Arc Quad Both;1 set;5 reps    Long Arc Quad Limitations red TB    Hamstring Curl Both;1 set;5 reps    Hamstring Limitations red TB      Knee/Hip Exercises: Supine   Bridges Both;1 set;5 reps                     PT Education - 05/17/21 1119     Education Details exam findings, POC, HEP; better to focus on one body part at a time for better results, will put scapular pain on hold to focus on knee after giving her updated HEP last session    Person(s) Educated Patient    Methods Explanation    Comprehension Verbalized understanding  PT Short Term Goals - 05/17/21 1123       PT SHORT TERM GOAL #1   Title independent with initial HEP    Time 1    Period Weeks    Status On-going                PT Long Term Goals - 05/17/21 1124       PT LONG TERM GOAL #1   Title independent with advanced HEP    Time 12    Period Weeks    Status Partially Met    Target Date 07/12/20      PT LONG TERM GOAL #2   Title decreased pain 50%    Time 12    Period Weeks    Status Achieved      PT LONG TERM GOAL #3   Title Understand posture and body mechanics    Time 12    Period Weeks    Status Partially Met      PT LONG TERM GOAL #4   Title Increase cervical ROM 25%    Time 12    Period Weeks    Status On-going      PT LONG TERM GOAL #5   Title return to the gym safely    Time 12    Period Weeks      Additional Long Term Goals   Additional Long Term Goals Yes      PT LONG TERM GOAL #6   Title MMT in LEs to improve by 1 grade in all weak groups    Time 8    Period Weeks    Status New    Target Date 07/12/21      PT LONG TERM GOAL #7   Title Will report no more instances of R knee buckling and no more falls due to R knee pain    Time 8    Period Weeks    Target Date 07/12/21      PT LONG TERM GOAL #8   Title Muscle flexibility to have improved by at least 50% in all tight groups    Time 8    Period Weeks    Status New    Target Date 07/12/21      PT LONG TERM GOAL  #9   TITLE Pain in R knee to be no more than 1/10 so that she can return to functional exercise safely    Time 8    Period Weeks    Status New                    Plan - 05/17/21 1120     Clinical Impression Statement Ms. Debell arrives today doing OK, her right knee has been causing her quite a bit of discomfort since Friday. She would like [place] to give her more HEP exercises for scapular pain but she is OK putting scapular issues on hold to focus on the knee after that since knee is more problematic. Exam reveals definite mm atrophy in R LE, as well as general muscle imbalance with some groups being quite weak and others being quite tight. Ligament and meniscus testing appear negative  today. Will plan on putting scapular pain on hold for a bit so we can really focus on her knee moving forward. I do think we can definitely help her out with her knee pain as it appears primarily musculoskeletal in nature.    Stability/Clinical Decision Making Stable/Uncomplicated    Clinical  Decision Making Low    Rehab Potential Good    PT Frequency 2x / week    PT Duration 8 weeks    PT Treatment/Interventions ADLs/Self Care Home Management;Electrical Stimulation;Cryotherapy;Moist Heat;Ultrasound;Neuromuscular re-education;Therapeutic exercise;Therapeutic activities;Functional mobility training;Stair training;Patient/family education;Manual techniques;Dry needling;Iontophoresis 4mg /ml Dexamethasone;Gait training;Balance training    PT Next Visit Plan needs HEP for scapular pain then we will put it on hold and focus on her knee    PT Home Exercise Plan [id number]    Consulted and Agree with Plan of Care Patient             Patient will benefit from skilled therapeutic intervention in order to improve the following deficits and impairments:  Decreased range of motion, Increased muscle spasms, Decreased activity tolerance, Pain, Postural dysfunction, Decreased strength, Impaired flexibility, Difficulty walking, Decreased mobility  Visit Diagnosis: Muscle weakness (generalized)  Muscle spasm of back  Other lack of coordination  Pain in thoracic spine  Acute pain of right knee  Other symptoms and signs involving the musculoskeletal system     Problem List Patient Active Problem List   Diagnosis Date Noted   Snoring 04/13/2021   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   Epistaxis 03/29/2021   CAD in native artery 08/15/2020   Basal cell carcinoma 06/30/2020   History of loop recorder 12/13/2019   Allergic rhinitis 12/08/2019   Cough 11/08/2019   Post-nasal drip 11/08/2019   Multiple joint pain 04/22/2019   Bilateral leg edema 04/22/2019   Vaginal atrophy 01/05/2019   Resting  tremor 09/08/2018   Sarcoidosis 05/08/2018   HTN (hypertension) 05/08/2018   Colon polyp 05/08/2018   Family hx of colon cancer 05/08/2018   Cystocele, midline 05/08/2018   Hyperlipidemia 05/08/2018   Vasovagal syncope 05/08/2018   Seborrheic keratosis 05/08/2018   Pseudophakia of both eyes 05/08/2018   GERD (gastroesophageal reflux disease) 05/08/2018   COPD (chronic obstructive pulmonary disease) (HCC) 05/08/2018   Lerry Liner PT, DPT, PN2   Supplemental Physical Therapist        [facility] [address]. [place], [place], [address] Phone: [phone]   Fax:  [phone]  Name: Kristen Suarez MRN: [id number] Date of Birth: 1945-08-17

## 2021-05-21 ENCOUNTER — Other Ambulatory Visit: Payer: Self-pay | Admitting: Cardiovascular Disease

## 2021-05-21 ENCOUNTER — Encounter: Payer: Medicare Other | Admitting: Physical Therapy

## 2021-05-21 DIAGNOSIS — I10 Essential (primary) hypertension: Secondary | ICD-10-CM

## 2021-05-21 NOTE — Telephone Encounter (Signed)
Refill request

## 2021-05-21 NOTE — Telephone Encounter (Signed)
Rx(s) sent to pharmacy electronically.  

## 2021-05-22 ENCOUNTER — Ambulatory Visit: Payer: Medicare Other | Admitting: Physical Therapy

## 2021-05-22 ENCOUNTER — Other Ambulatory Visit: Payer: Self-pay

## 2021-05-22 DIAGNOSIS — M6281 Muscle weakness (generalized): Secondary | ICD-10-CM | POA: Diagnosis not present

## 2021-05-22 DIAGNOSIS — M25561 Pain in right knee: Secondary | ICD-10-CM

## 2021-05-22 NOTE — Therapy (Signed)
[facility] [address] ?[place], [place], [address] ?Phone: [phone]   Fax:  [phone] ? ?Physical Therapy Treatment ? ?Patient Details  ?Name: Kristen Suarez ?MRN: [id number] ?Date of Birth: 30-Oct-1945 ?Referring Provider (PT): Georgina Snell ? ? ?Encounter Date: 05/22/2021 ? ? PT End of Session - 05/22/21 6237   ? ? Visit Number 11   ? Number of Visits 22   ? Date for PT Re-Evaluation 06/28/21   ? Authorization Type [employer]   ? PT Start Time [phone]   ? PT Stop Time 0930   ? PT Time Calculation (min) 46 min   ? ?  ?  ? ?  ? ? ?Past Medical History:  ?Diagnosis Date  ? Arthritis   ? Asthma   ? CAD in native artery 08/15/2020  ? Chickenpox   ? Colon polyps 2010  ? Epistaxis 03/29/2021  ? Heart murmur   ? History of fainting spells of unknown cause   ? Hyperlipidemia   ? Hypertension   ? Sarcoid 06/16/1976  ? ? ?Past Surgical History:  ?Procedure Laterality Date  ? ABDOMINAL HYSTERECTOMY  1987  ? BASAL CELL CARCINOMA EXCISION  06/30/2020  ? nose  ? LUNG BIOPSY  06/16/1976  ? PERIPHERAL VASCULAR INTERVENTION Right 11/29/2020  ? Procedure: PERIPHERAL VASCULAR INTERVENTION;  Surgeon: Wellington Hampshire, MD;  Location: [facility];  Service: Cardiovascular;  Laterality: Right;  Renal Artery  ? RENAL ANGIOGRAPHY N/A 11/29/2020  ? Procedure: RENAL ANGIOGRAPHY;  Surgeon: Wellington Hampshire, MD;  Location: [facility];  Service: Cardiovascular;  Laterality: N/A;  ? ? ?There were no vitals filed for this visit. ? ? Subjective Assessment - 05/22/21 0846   ? ? Subjective doing knee ex, no real changes , but no buckling.shld just burns   ? Currently in Pain? Yes   ? Pain Score 2    ? Pain Location Knee   ? Pain Orientation Right   ? ?  ?  ? ?  ? ? ? ? ? ? ? ? ? ? ? ? ? ? ? ? ? ? ? ? [facility] Adult PT Treatment/Exercise - 05/22/21 0001   ? ?  ? Neck Exercises: Machines for Strengthening  ? Nustep L 4  19mn   ?  ? Knee/Hip Exercises: Machines for Strengthening  ? Cybex Knee  Extension 5# 2 sets 10   ? Cybex Knee Flexion 15# 2 sets 10   ? Cybex Leg Press 20# 2 sets 10   ?  ? Knee/Hip Exercises: Standing  ? Heel Raises Both;15 reps   black bar plus toe raises  ? Lateral Step Up Both;10 reps;Hand Hold: 1;Step Height: 4"   opp leg abd  ? Forward Step Up Both;10 reps;Hand Hold: 2;Step Height: 4"   opp leg hip flex  ? Walking with Sports Cord 20# 3 x 4 way CGA   ?  ? Knee/Hip Exercises: Seated  ? Ball Squeeze 15x   ? Clamshell with TMarga Hoots  ? Marching Strengthening;Both;2 sets;10 reps   green tband  ? Sit to Sand 5 reps;without UE support   lightheaded and SOB so stopped  ? ?  ?  ? ?  ? ? ? ? ? ? ? ? ? ? PT Education - 05/22/21 0915   ? ? Education Details ball squeeze and clams, step ups with rail at home   ? Person(s) Educated Patient   ? Methods Explanation;Demonstration   ?  Comprehension Verbalized understanding;Returned demonstration   ? ?  ?  ? ?  ? ? ? PT Short Term Goals - 05/17/21 1123   ? ?  ? PT SHORT TERM GOAL #1  ? Title independent with initial HEP   ? Time 1   ? Period Weeks   ? Status On-going   ? ?  ?  ? ?  ? ? ? ? PT Long Term Goals - 05/17/21 1124   ? ?  ? PT LONG TERM GOAL #1  ? Title independent with advanced HEP   ? Time 12   ? Period Weeks   ? Status Partially Met   ? Target Date 07/12/20   ?  ? PT LONG TERM GOAL #2  ? Title decreased pain 50%   ? Time 12   ? Period Weeks   ? Status Achieved   ?  ? PT LONG TERM GOAL #3  ? Title Understand posture and body mechanics   ? Time 12   ? Period Weeks   ? Status Partially Met   ?  ? PT LONG TERM GOAL #4  ? Title Increase cervical ROM 25%   ? Time 12   ? Period Weeks   ? Status On-going   ?  ? PT LONG TERM GOAL #5  ? Title return to the gym safely   ? Time 12   ? Period Weeks   ?  ? Additional Long Term Goals  ? Additional Long Term Goals Yes   ?  ? PT LONG TERM GOAL #6  ? Title MMT in LEs to improve by 1 grade in all weak groups   ? Time 8   ? Period Weeks   ? Status New   ? Target Date 07/12/21   ?  ? PT LONG TERM  GOAL #7  ? Title Will report no more instances of R knee buckling and no more falls due to R knee pain   ? Time 8   ? Period Weeks   ? Target Date 07/12/21   ?  ? PT LONG TERM GOAL #8  ? Title Muscle flexibility to have improved by at least 50% in all tight groups   ? Time 8   ? Period Weeks   ? Status New   ? Target Date 07/12/21   ?  ? PT LONG TERM GOAL  #9  ? TITLE Pain in R knee to be no more than 1/10 so that she can return to functional exercise safely   ? Time 8   ? Period Weeks   ? Status New   ? ?  ?  ? ?  ? ? ? ? ? ? ? ? Plan - 05/22/21 0921   ? ? Clinical Impression Statement focus session on LE strengthening with cuing - hip ,knee and ankle with instruction for HEP ex at home and gu=ym. adverse issues with STS so stopped   ? PT Next Visit Plan pt has HEP for scap and gym ex , monitor scap and progress knee and LE strength   ? ?  ?  ? ?  ? ? ?Patient will benefit from skilled therapeutic intervention in order to improve the following deficits and impairments:    ? ?Visit Diagnosis: ?Muscle weakness (generalized) ? ?Acute pain of right knee ? ? ? ? ?Problem List ?Patient Active Problem List  ? Diagnosis Date Noted  ? Snoring 04/13/2021  ? 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  ? Epistaxis 03/29/2021  ?  CAD in native artery 08/15/2020  ? Basal cell carcinoma 06/30/2020  ? History of loop recorder 12/13/2019  ? Allergic rhinitis 12/08/2019  ? Cough 11/08/2019  ? Post-nasal drip 11/08/2019  ? Multiple joint pain 04/22/2019  ? Bilateral leg edema 04/22/2019  ? Vaginal atrophy 01/05/2019  ? Resting tremor 09/08/2018  ? Sarcoidosis 05/08/2018  ? HTN (hypertension) 05/08/2018  ? Colon polyp 05/08/2018  ? Family hx of colon cancer 05/08/2018  ? Cystocele, midline 05/08/2018  ? Hyperlipidemia 05/08/2018  ? Vasovagal syncope 05/08/2018  ? Seborrheic keratosis 05/08/2018  ? Pseudophakia of both eyes 05/08/2018  ? GERD (gastroesophageal reflux disease) 05/08/2018  ? COPD (chronic obstructive pulmonary disease) ([place])  05/08/2018  ? ? ?Kristen Suarez,Kristen Suarez, PTA ?05/22/2021, 9:24 AM ? ?[facility] ?[place] [address] ?[place], [place], [address] ?Phone: [phone]   Fax:  [phone] ? ?Name: Taylore Hinde Lowery ?MRN: [id number] ?Date of Birth: 11/12/1945 ? ? ? ?

## 2021-05-24 ENCOUNTER — Other Ambulatory Visit: Payer: Self-pay

## 2021-05-24 ENCOUNTER — Ambulatory Visit: Payer: Medicare Other | Admitting: Physical Therapy

## 2021-05-24 ENCOUNTER — Ambulatory Visit: Payer: Medicare Other | Admitting: Rheumatology

## 2021-05-24 ENCOUNTER — Other Ambulatory Visit: Payer: Self-pay | Admitting: Nurse Practitioner

## 2021-05-24 DIAGNOSIS — M6281 Muscle weakness (generalized): Secondary | ICD-10-CM | POA: Diagnosis not present

## 2021-05-24 DIAGNOSIS — R0982 Postnasal drip: Secondary | ICD-10-CM

## 2021-05-24 DIAGNOSIS — M25561 Pain in right knee: Secondary | ICD-10-CM

## 2021-05-24 DIAGNOSIS — R059 Cough, unspecified: Secondary | ICD-10-CM

## 2021-05-24 DIAGNOSIS — R278 Other lack of coordination: Secondary | ICD-10-CM

## 2021-05-24 NOTE — Therapy (Signed)
[facility] ?[place] ?[address] ?[place], [place], [address] ?Phone: [phone]   Fax:  [phone] ? ?Physical Therapy Treatment ? ?Patient Details  ?Name: Kristen Suarez ?MRN: [id number] ?Date of Birth: 01-02-1946 ?Referring Provider (PT): Georgina Snell ? ? ?Encounter Date: 05/24/2021 ? ? PT End of Session - 05/24/21 0932   ? ? Visit Number 12   ? Number of Visits 22   ? Date for PT Re-Evaluation 06/28/21   ? Authorization Type [employer]   ? PT Start Time 0840   ? PT Stop Time 0930   ? PT Time Calculation (min) 50 min   ? ?  ?  ? ?  ? ? ?Past Medical History:  ?Diagnosis Date  ? Arthritis   ? Asthma   ? CAD in native artery 08/15/2020  ? Chickenpox   ? Colon polyps 2010  ? Epistaxis 03/29/2021  ? Heart murmur   ? History of fainting spells of unknown cause   ? Hyperlipidemia   ? Hypertension   ? Sarcoid 06/16/1976  ? ? ?Past Surgical History:  ?Procedure Laterality Date  ? ABDOMINAL HYSTERECTOMY  1987  ? BASAL CELL CARCINOMA EXCISION  06/30/2020  ? nose  ? LUNG BIOPSY  06/16/1976  ? PERIPHERAL VASCULAR INTERVENTION Right 11/29/2020  ? Procedure: PERIPHERAL VASCULAR INTERVENTION;  Surgeon: Wellington Hampshire, MD;  Location: [facility];  Service: Cardiovascular;  Laterality: Right;  Renal Artery  ? RENAL ANGIOGRAPHY N/A 11/29/2020  ? Procedure: RENAL ANGIOGRAPHY;  Surgeon: Wellington Hampshire, MD;  Location: [facility];  Service: Cardiovascular;  Laterality: N/A;  ? ? ?There were no vitals filed for this visit. ? ? Subjective Assessment - 05/24/21 0845   ? ? Subjective tired but okay after last session. shot helped knee 3/10. back 3/10 -" I have my aches and pains today"   ? Currently in Pain? Yes   ? Pain Score 3    ? ?  ?  ? ?  ? ? ? ? ? ? ? ? ? ? ? ? ? ? ? ? ? ? ? ? [facility] Adult PT Treatment/Exercise - 05/24/21 0001   ? ?  ? High Level Balance  ? High Level Balance Activities Side stepping;Tandem walking   foam mat  ?  ? Neck Exercises: Machines for Strengthening  ? UBE  (Upper Arm Bike) L 3 2 min fwd/2 min back   ? Nustep L 4  27mn   LE only  ?  ? Knee/Hip Exercises: Machines for Strengthening  ? Cybex Knee Extension 5# 2 sets 10   ? Cybex Knee Flexion 15# 2 sets 10   ? Cybex Leg Press 20# 2 sets 10   calf raises 20# 2 sets 10  ?  ? Knee/Hip Exercises: Standing  ? Walking with Sports Cord 20# 4 x laterally over roll- CGA   ?  ? Knee/Hip Exercises: Seated  ? Ball Squeeze 15x   ? Clamshell with TMarga Hoots  ? Marching Strengthening;Both;20 reps   green tband  ? Sit to Sand 10 reps;without UE support   red tbnad holding legs in ABD  ? ?  ?  ? ?  ? ? ? ? ? ? ? ? ? ? ? ? PT Short Term Goals - 05/17/21 1123   ? ?  ? PT SHORT TERM GOAL #1  ? Title independent with initial HEP   ? Time 1   ? Period Weeks   ?  Status On-going   ? ?  ?  ? ?  ? ? ? ? PT Long Term Goals - 05/24/21 0931   ? ?  ? PT LONG TERM GOAL #1  ? Title independent with advanced HEP   ? Baseline issued additional gym ex to try for LE   ? Status Partially Met   ?  ? PT LONG TERM GOAL #2  ? Title decreased pain 50%   ? Baseline not for knee   ? Status Partially Met   ?  ? PT LONG TERM GOAL #6  ? Title MMT in LEs to improve by 1 grade in all weak groups   ? Status On-going   ?  ? PT LONG TERM GOAL #7  ? Title Will report no more instances of R knee buckling and no more falls due to R knee pain   ? Status Partially Met   ? ?  ?  ? ?  ? ? ? ? ? ? ? ? Plan - 05/24/21 0932   ? ? Clinical Impression Statement progressed LE strengthening and given written instructions for gym machine interventions. progressed ex and added in some balance with tx   ? PT Next Visit Plan progress strength   ? ?  ?  ? ?  ? ? ?Patient will benefit from skilled therapeutic intervention in order to improve the following deficits and impairments:  Decreased range of motion, Increased muscle spasms, Decreased activity tolerance, Pain, Postural dysfunction, Decreased strength, Impaired flexibility, Difficulty walking, Decreased mobility ? ?Visit  Diagnosis: ?Muscle weakness (generalized) ? ?Acute pain of right knee ? ?Other lack of coordination ? ? ? ? ?Problem List ?Patient Active Problem List  ? Diagnosis Date Noted  ? Snoring 04/13/2021  ? 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  ? Epistaxis 03/29/2021  ? CAD in native artery 08/15/2020  ? Basal cell carcinoma 06/30/2020  ? History of loop recorder 12/13/2019  ? Allergic rhinitis 12/08/2019  ? Cough 11/08/2019  ? Post-nasal drip 11/08/2019  ? Multiple joint pain 04/22/2019  ? Bilateral leg edema 04/22/2019  ? Vaginal atrophy 01/05/2019  ? Resting tremor 09/08/2018  ? Sarcoidosis 05/08/2018  ? HTN (hypertension) 05/08/2018  ? Colon polyp 05/08/2018  ? Family hx of colon cancer 05/08/2018  ? Cystocele, midline 05/08/2018  ? Hyperlipidemia 05/08/2018  ? Vasovagal syncope 05/08/2018  ? Seborrheic keratosis 05/08/2018  ? Pseudophakia of both eyes 05/08/2018  ? GERD (gastroesophageal reflux disease) 05/08/2018  ? COPD (chronic obstructive pulmonary disease) ([place]) 05/08/2018  ? ? ?Ruchy Wildrick,ANGIE, PTA ?05/24/2021, 9:34 AM ? ? ?[facility] ?[address] ?[place], [place], [address] ?Phone: [phone]   Fax:  [phone] ? ?Name: Kristen Suarez ?MRN: [id number] ?Date of Birth: 09/14/1945 ? ? ? ?

## 2021-05-28 ENCOUNTER — Encounter: Payer: Medicare Other | Admitting: Physical Therapy

## 2021-05-29 ENCOUNTER — Ambulatory Visit: Payer: Medicare Other | Admitting: Physical Therapy

## 2021-05-29 ENCOUNTER — Other Ambulatory Visit: Payer: Self-pay

## 2021-05-29 ENCOUNTER — Ambulatory Visit: Payer: Medicare Other | Admitting: Family Medicine

## 2021-05-29 DIAGNOSIS — M6281 Muscle weakness (generalized): Secondary | ICD-10-CM

## 2021-05-29 NOTE — Therapy (Signed)
[facility] ?[place], [place], [address] ?Phone: [phone]   Fax:  [phone] ? ?Physical Therapy Treatment ? ?Patient Details  ?Name: Kristen Suarez ?MRN: [id number] ?Date of Birth: [birth year] ?Referring Provider (PT): Kristen Suarez ? ? ?Encounter Date: 05/29/2021 ? ? PT End of Session - 05/29/21 0924   ? ? Visit Number 13   ? Date for PT Re-Evaluation 06/28/21   ? PT Start Time 0840   ? PT Stop Time 0925   ? PT Time Calculation (min) 45 min   ? ?  ?  ? ?  ? ? ?Past Medical History:  ?Diagnosis Date  ? Arthritis   ? Asthma   ? CAD in native artery 08/15/2020  ? Chickenpox   ? Colon polyps 2010  ? Epistaxis 03/29/2021  ? Heart murmur   ? History of fainting spells of unknown cause   ? Hyperlipidemia   ? Hypertension   ? Sarcoid 06/16/1976  ? ? ?Past Surgical History:  ?Procedure Laterality Date  ? ABDOMINAL HYSTERECTOMY  1987  ? BASAL CELL CARCINOMA EXCISION  06/30/2020  ? nose  ? LUNG BIOPSY  06/16/1976  ? PERIPHERAL VASCULAR INTERVENTION Right 11/29/2020  ? Procedure: PERIPHERAL VASCULAR INTERVENTION;  Surgeon: Wellington Hampshire, MD;  Location: [facility];  Service: Cardiovascular;  Laterality: Right;  Renal Artery  ? RENAL ANGIOGRAPHY N/A 11/29/2020  ? Procedure: RENAL ANGIOGRAPHY;  Surgeon: Wellington Hampshire, MD;  Location: [facility];  Service: Cardiovascular;  Laterality: N/A;  ? ? ?There were no vitals filed for this visit. ? ? Subjective Assessment - 05/29/21 0843   ? ? Subjective hurt like heck Sat,limping Sunday and normal yesterday and today. made appt with trainer at gym for leg machines   ? Currently in Pain? No/denies   ? ?  ?  ? ?  ? ? ? ? ? ? ? ? ? ? ? ? ? ? ? ? ? ? ? ? [facility] Adult PT Treatment/Exercise - 05/29/21 0001   ? ?  ? Neck Exercises: Machines for Strengthening  ? Nustep L 4  82mn   ?  ? Neck Exercises: Standing  ? Other Standing Exercises 5# cable pulley squat with shld ext 10 x, lunge with row 5 x each leg   ?  ?  Knee/Hip Exercises: Machines for Strengthening  ? Cybex Knee Extension 5# 2 sets 10   ? Cybex Knee Flexion 15# 2 sets 10   ? Cybex Leg Press 20# 2 sets 10   calf raises 20# 2 set s10  ?  ? Knee/Hip Exercises: Standing  ? Lateral Step Up Both;1 set;10 reps   2# 6 inch  ? Forward Step Up Both;2 sets;10 reps   2# 6 inch  ? Walking with Sports Cord 20# 4 x laterally over roll- CGA   fwd 4 x with 4 inch step up- 2 each leg  ?  ? Knee/Hip Exercises: Seated  ? Ball Squeeze 15x   ? Clamshell with TMarga Hoots  ? Marching Strengthening;Both;20 reps   green tband  ?  ? Knee/Hip Exercises: Supine  ? Straight Leg Raises Strengthening;Both;2 sets;5 reps   2#  ? ?  ?  ? ?  ? ? ? ? ? ? ? ? ? ? ? ? PT Short Term Goals - 05/17/21 1123   ? ?  ? PT SHORT TERM GOAL #1  ? Title independent with initial HEP   ?  Time 1   ? Period Weeks   ? Status On-going   ? ?  ?  ? ?  ? ? ? ? PT Long Term Goals - 05/24/21 0931   ? ?  ? PT LONG TERM GOAL #1  ? Title independent with advanced HEP   ? Baseline issued additional gym ex to try for LE   ? Status Partially Met   ?  ? PT LONG TERM GOAL #2  ? Title decreased pain 50%   ? Baseline not for knee   ? Status Partially Met   ?  ? PT LONG TERM GOAL #6  ? Title MMT in LEs to improve by 1 grade in all weak groups   ? Status On-going   ?  ? PT LONG TERM GOAL #7  ? Title Will report no more instances of R knee buckling and no more falls due to R knee pain   ? Status Partially Met   ? ?  ?  ? ?  ? ? ? ? ? ? ? ? Plan - 05/29/21 0925   ? ? Clinical Impression Statement pt arrived today doing well but had some achey days over weekend- unsure why. a bit more SOB today. pt has appt Thursday at gym with trainer to review program and incorporate in LE machines. added func ex today with balance incorporation with cuing and SBA   ? PT Treatment/Interventions ADLs/Self Care Home Management;Electrical Stimulation;Cryotherapy;Moist Heat;Ultrasound;Neuromuscular re-education;Therapeutic exercise;Therapeutic  activities;Functional mobility training;Stair training;Patient/family education;Manual techniques;Dry needling;Iontophoresis 80m/ml Dexamethasone;Gait training;Balance training   ? PT Next Visit Plan assses  and progress func strength. check goals   ? ?  ?  ? ?  ? ? ?Patient will benefit from skilled therapeutic intervention in order to improve the following deficits and impairments:  Decreased range of motion, Increased muscle spasms, Decreased activity tolerance, Pain, Postural dysfunction, Decreased strength, Impaired flexibility, Difficulty walking, Decreased mobility ? ?Visit Diagnosis: ?Muscle weakness (generalized) ? ? ? ? ?Problem List ?Patient Active Problem List  ? Diagnosis Date Noted  ? Snoring 04/13/2021  ? 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  ? Epistaxis 03/29/2021  ? CAD in native artery 08/15/2020  ? Basal cell carcinoma 06/30/2020  ? History of loop recorder 12/13/2019  ? Allergic rhinitis 12/08/2019  ? Cough 11/08/2019  ? Post-nasal drip 11/08/2019  ? Multiple joint pain 04/22/2019  ? Bilateral leg edema 04/22/2019  ? Vaginal atrophy 01/05/2019  ? Resting tremor 09/08/2018  ? Sarcoidosis 05/08/2018  ? HTN (hypertension) 05/08/2018  ? Colon polyp 05/08/2018  ? Family hx of colon cancer 05/08/2018  ? Cystocele, midline 05/08/2018  ? Hyperlipidemia 05/08/2018  ? Vasovagal syncope 05/08/2018  ? Seborrheic keratosis 05/08/2018  ? Pseudophakia of both eyes 05/08/2018  ? GERD (gastroesophageal reflux disease) 05/08/2018  ? COPD (chronic obstructive pulmonary disease) ([facility] 05/08/2018  ? ? ?Kristen Suarez,Kristen Suarez, PTA ?05/29/2021, 9:27 AM ? ? ?[facility] ?[place] [address]?Phone: [phone]  Fax:  [phone]? ?Name: Kristen Suarez ?MRN: [id number]?Date of Birth: [birth year]? ? ? ?

## 2021-05-31 ENCOUNTER — Other Ambulatory Visit: Payer: Self-pay

## 2021-05-31 ENCOUNTER — Encounter (HOSPITAL_BASED_OUTPATIENT_CLINIC_OR_DEPARTMENT_OTHER): Payer: Self-pay | Admitting: Family

## 2021-05-31 ENCOUNTER — Ambulatory Visit (INDEPENDENT_AMBULATORY_CARE_PROVIDER_SITE_OTHER): Payer: Medicare Other | Admitting: Family

## 2021-05-31 ENCOUNTER — Encounter: Payer: Self-pay | Admitting: Physical Therapy

## 2021-05-31 ENCOUNTER — Ambulatory Visit: Payer: Medicare Other | Admitting: Physical Therapy

## 2021-05-31 VITALS — BP 140/80 | HR 76 | Ht 63.0 in | Wt 146.3 lb

## 2021-05-31 DIAGNOSIS — E785 Hyperlipidemia, unspecified: Secondary | ICD-10-CM | POA: Diagnosis not present

## 2021-05-31 DIAGNOSIS — M6281 Muscle weakness (generalized): Secondary | ICD-10-CM | POA: Diagnosis not present

## 2021-05-31 DIAGNOSIS — I701 Atherosclerosis of renal artery: Secondary | ICD-10-CM | POA: Diagnosis not present

## 2021-05-31 DIAGNOSIS — I150 Renovascular hypertension: Secondary | ICD-10-CM | POA: Diagnosis not present

## 2021-05-31 DIAGNOSIS — I2510 Atherosclerotic heart disease of native coronary artery without angina pectoris: Secondary | ICD-10-CM | POA: Diagnosis not present

## 2021-05-31 NOTE — Therapy (Signed)
[facility] ?[address] ?[place], [place], [address] ?Phone: [phone]   Fax:  [phone] ? ?Physical Therapy Treatment ? ?Patient Details  ?Name: Kristen Suarez ?MRN: [id number] ?Date of Birth: 01/30/1946 ?Referring Provider (PT): Georgina Snell ? ? ?Encounter Date: 05/31/2021 ? ? PT End of Session - 05/31/21 1620   ? ? Visit Number 14   ? Number of Visits 22   ? Date for PT Re-Evaluation 06/28/21   ? Authorization Type [facility]   ? Progress Note Due on Visit 20   ? PT Start Time 1533   ? PT Stop Time 1612   ? PT Time Calculation (min) 39 min   ? Activity Tolerance Patient tolerated treatment well   ? Behavior During Therapy [facility] for tasks assessed/performed   ? ?  ?  ? ?  ? ? ?Past Medical History:  ?Diagnosis Date  ? Arthritis   ? Asthma   ? CAD in native artery 08/15/2020  ? Chickenpox   ? Colon polyps 2010  ? Epistaxis 03/29/2021  ? Heart murmur   ? History of fainting spells of unknown cause   ? Hyperlipidemia   ? Hypertension   ? Sarcoid 06/16/1976  ? ? ?Past Surgical History:  ?Procedure Laterality Date  ? ABDOMINAL HYSTERECTOMY  1987  ? BASAL CELL CARCINOMA EXCISION  06/30/2020  ? nose  ? LUNG BIOPSY  06/16/1976  ? PERIPHERAL VASCULAR INTERVENTION Right 11/29/2020  ? Procedure: PERIPHERAL VASCULAR INTERVENTION;  Surgeon: Wellington Hampshire, MD;  Location: [facility];  Service: Cardiovascular;  Laterality: Right;  Renal Artery  ? RENAL ANGIOGRAPHY N/A 11/29/2020  ? Procedure: RENAL ANGIOGRAPHY;  Surgeon: Wellington Hampshire, MD;  Location: [facility];  Service: Cardiovascular;  Laterality: N/A;  ? ? ?There were no vitals filed for this visit. ? ? Subjective Assessment - 05/31/21 1535   ? ? Subjective I'm doing OK, saw my personal trainer at [employer] [employer] this morning and they worked up a plan for strengthening for upper body and lower body for me today. I also saw my cardiologist they are really concerned about my BP again, considering 24/7 BP monitoring if it  stays high. I had enough pain in my R shoulder that my husband put voltaran on it, then I took a nap before coming here.   ? Pertinent History sarcoid   ? Patient Stated Goals get knee stronger so it doesn't hurt   ? Currently in Pain? No/denies   ? ?  ?  ? ?  ? ? ? ? ? ? ? ? ? ? ? ? ? ? ? ? ? ? ? ? ? ? ? ? Balance Exercises - 05/31/21 0001   ? ?  ? Balance Exercises: Standing  ? Tandem Stance Eyes open;Foam/compliant surface;2 reps;30 secs   ? Tandem Gait Forward;Retro;4 reps   ? ?  ?  ? ?  ? ? ? ? ? PT Education - 05/31/21 1619   ? ? Education Details see note for details- extensive education given about exercise routine adjustments, age related HR max, balance and core strength days, recovery days, and POC moving forward   ? Person(s) Educated Patient   ? Methods Explanation   ? Comprehension Verbalized understanding   ? ?  ?  ? ?  ? ? ? PT Short Term Goals - 05/17/21 1123   ? ?  ? PT SHORT TERM GOAL #1  ? Title independent with initial HEP   ?  Time 1   ? Period Weeks   ? Status On-going   ? ?  ?  ? ?  ? ? ? ? PT Long Term Goals - 05/24/21 0931   ? ?  ? PT LONG TERM GOAL #1  ? Title independent with advanced HEP   ? Baseline issued additional gym ex to try for LE   ? Status Partially Met   ?  ? PT LONG TERM GOAL #2  ? Title decreased pain 50%   ? Baseline not for knee   ? Status Partially Met   ?  ? PT LONG TERM GOAL #6  ? Title MMT in LEs to improve by 1 grade in all weak groups   ? Status On-going   ?  ? PT LONG TERM GOAL #7  ? Title Will report no more instances of R knee buckling and no more falls due to R knee pain   ? Status Partially Met   ? ?  ?  ? ?  ? ? ? ? ? ? ? ? Plan - 05/31/21 1620   ? ? Clinical Impression Statement Ms. Strollo arrives concerned about her BP, it has been quite high recently. BP 169/100 HR 81 on first check. She is concerned about how to progress herself after PT is over, just does not want to backslide at this point. Second BP 165/96, slightly lower but still quite high. We  discussed her workout routine, focuses on light stretching on Sundays, doing weight training at [facility] 2 days per week, yoga one day per week, she mostly takes Saturdays off to take care of the house which leaves 2 days during which she feels she needs other exercise to work on. We discussed cardio exercises including age related HR (220-age) as well as HR+20 rule; her age predicted HR max is 145, ideal HR parameters in this case would be 87 to 116 (60-80% of max HR). We also discussed and encouraged light workouts on swiss ball for improved core activation and light recovery days, also balance training with personal trainer spotting her at [place]. Third BP check 159/96. We will try 1x/week for 3 weeks moving forward, I do think she is likely close to being ready for DC especially given extensive exercise routine in place.   ? Stability/Clinical Decision Making Stable/Uncomplicated   ? Clinical Decision Making Low   ? Rehab Potential Good   ? PT Frequency Other (comment)   1-2x/week  ? PT Duration 3 weeks   ? PT Treatment/Interventions ADLs/Self Care Home Management;Electrical Stimulation;Cryotherapy;Moist Heat;Ultrasound;Neuromuscular re-education;Therapeutic exercise;Therapeutic activities;Functional mobility training;Stair training;Patient/family education;Manual techniques;Dry needling;Iontophoresis 22m/ml Dexamethasone;Gait training;Balance training   ? PT Next Visit Plan 3 sessions before likely DC- focus on balance, check BP   ? PT Home Exercise Plan [email or web address]  ? Consulted and Agree with Plan of Care Patient   ? ?  ?  ? ?  ? ? ?Patient will benefit from skilled therapeutic intervention in order to improve the following deficits and impairments:  Decreased range of motion, Increased muscle spasms, Decreased activity tolerance, Pain, Postural dysfunction, Decreased strength, Impaired flexibility, Difficulty walking, Decreased mobility ? ?Visit Diagnosis: ?Muscle weakness (generalized) ? ?Acute pain of right  knee ? ?Other lack of coordination ? ?Muscle spasm of back ? ?Pain in thoracic spine ? ?Other symptoms and signs involving the musculoskeletal system ? ? ? ? ?Problem List ?Patient Active Problem List  ? Diagnosis Date Noted  ? Snoring 04/13/2021  ? 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  ?  Epistaxis 03/29/2021  ? CAD in native artery 08/15/2020  ? Basal cell carcinoma 06/30/2020  ? History of loop recorder 12/13/2019  ? Allergic rhinitis 12/08/2019  ? Cough 11/08/2019  ? Post-nasal drip 11/08/2019  ? Multiple joint pain 04/22/2019  ? Bilateral leg edema 04/22/2019  ? Vaginal atrophy 01/05/2019  ? Resting tremor 09/08/2018  ? Sarcoidosis 05/08/2018  ? HTN (hypertension) 05/08/2018  ? Colon polyp 05/08/2018  ? Family hx of colon cancer 05/08/2018  ? Cystocele, midline 05/08/2018  ? Hyperlipidemia 05/08/2018  ? Vasovagal syncope 05/08/2018  ? Seborrheic keratosis 05/08/2018  ? Pseudophakia of both eyes 05/08/2018  ? GERD (gastroesophageal reflux disease) 05/08/2018  ? COPD (chronic obstructive pulmonary disease) ([place]) 05/08/2018  ? ?Rickard Kennerly U PT, DPT, PN2  ? ?Supplemental Physical Therapist ?[facility]  ? ? ? ? ? ?[facility] [address] ?[place], [place], [address] ?Phone: [phone]   Fax:  [phone] ? ?Name: Amarie Viles Creeden ?MRN: [id number] ?Date of Birth: March 18, 1946 ? ? ? ?

## 2021-05-31 NOTE — Patient Instructions (Signed)
Medication Instructions:  ? ?Continue your current medications.  ? ?*If you need a refill on your cardiac medications before your next appointment, please call your pharmacy* ? ?Lab Work: ?None ordered today.  ? ?If you have labs (blood work) drawn today and your tests are completely normal, you will receive your results only by: ?MyChart Message (if you have MyChart) OR ?A paper copy in the mail ?If you have any lab test that is abnormal or we need to change your treatment, we will call you to review the results. ? ? ?Testing/Procedures: ?None ordered today.  ? ?Follow-Up: ?At [facility], you and your health needs are our priority.  As part of our continuing mission to provide you with exceptional heart care, we have created designated Provider Care Teams.  These Care Teams include your primary Cardiologist (physician) and Advanced Practice Providers (APPs -  Physician Assistants and Nurse Practitioners) who all work together to provide you with the care you need, when you need it. ? ?We recommend signing up for the patient portal called "MyChart".  Sign up information is provided on this After Visit Summary.  MyChart is used to connect with patients for Virtual Visits (Telemedicine).  Patients are able to view lab/test results, encounter notes, upcoming appointments, etc.  Non-urgent messages can be sent to your provider as well.   ?To learn more about what you can do with MyChart, go to [email or web address].   ? ?Your next appointment:   ?3 month(s) ? ?The format for your next appointment:   ?In Person ? ?Provider:   ?Skeet Latch, MD  ? ? ?Other Instructions ? ?We will monitor your blood pressure at home once per day at least 2 hours after medications and keep a log. Send via MyChart in one week. If BP consistently more than 130/80, we will consider adding back Hydrochlorothiazide. If you have two blood pressure readings more than 160/90 at home that are two hours apart, you could take a dose of  Hydrochlorothiazide. Recommend checking blood pressure at rest, not after exercise. Loel Dubonnet, NP will discuss possible 24 hour blood pressure monitor with Dr. Oval Linsey. ? ?Our blood pressure cuff readings today: ?Manual 144/82 ?New BP cuff 153/80 (for future - subtract 9 to get accurate reading) ?Old BP cuff 156/71 (for future - subtract 12 to get accurate reading) ?

## 2021-05-31 NOTE — Progress Notes (Signed)
? ?Office Visit  ?  ?Patient Name: Kristen Suarez ?Date of Encounter: 05/31/2021 ? ?PCP:  Flossie Buffy, NP ?  ?[facility]  ?Cardiologist:  Skeet Latch, MD / Dr. Fletcher Anon (PAD) ?Advanced Practice Provider:  No care team member to display ?Electrophysiologist:  Virl Axe, MD  ?   ? ?Chief Complaint  ?  ?Kristen Suarez is a 76 y.o. female with a hx of renovascular hypertension, syncope, coronary calcification on CT, pulmonary sarcoidosis, asthma, hyperlipidemia presents today for hypertension follow up.  ? ?Past Medical History  ?  ?Past Medical History:  ?Diagnosis Date  ? Arthritis   ? Asthma   ? CAD in native artery 08/15/2020  ? Chickenpox   ? Colon polyps 2010  ? Epistaxis 03/29/2021  ? Heart murmur   ? History of fainting spells of unknown cause   ? Hyperlipidemia   ? Hypertension   ? Sarcoid 06/16/1976  ? ?Past Surgical History:  ?Procedure Laterality Date  ? ABDOMINAL HYSTERECTOMY  1987  ? BASAL CELL CARCINOMA EXCISION  06/30/2020  ? nose  ? LUNG BIOPSY  06/16/1976  ? PERIPHERAL VASCULAR INTERVENTION Right 11/29/2020  ? Procedure: PERIPHERAL VASCULAR INTERVENTION;  Surgeon: Wellington Hampshire, MD;  Location: [facility];  Service: Cardiovascular;  Laterality: Right;  Renal Artery  ? RENAL ANGIOGRAPHY N/A 11/29/2020  ? Procedure: RENAL ANGIOGRAPHY;  Surgeon: Wellington Hampshire, MD;  Location: [facility];  Service: Cardiovascular;  Laterality: N/A;  ? ? ?Allergies ? ?Allergies  ?Allergen Reactions  ? Penicillins Itching  ? Sulfa Antibiotics Itching  ? Keflex [Cephalexin] Rash  ? ? ?History of Present Illness  ?  ?Kristen Suarez is a 76 y.o. female with a hx of renovascular hypertension, syncope, coronary calcification on CT, pulmonary sarcoidosis, asthma, hyperlipidemia  last seen 03/29/21 ? ?She had ILR placed in [place] January 2019 due to syncope. This is now followed by Dr. Caryl Comes. She was referred by her primary care provider to Dr. Oval Linsey for management of  hypertension. Renal artery duplex June 2022 with significant right renal artery stenosis with peak velocity 291 with bruit, no flow into left renal artery. She had angiography with Dr. 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 A few days post procedure she had hypotension and near syncope, HCTZ and Spironolactone were discontinued.  ? ?ED visit 01/03/21 with syncope while doing chair yoga at [facility]. Her BP on arrival to ED was 118/68. Dr. Fletcher Anon and Dr. Oval Linsey recommended she decrease Amlodipine to 57m QD and Irbesartan to 1520mQD.  She was seen in follow-up 01/22/2021.  She was recommended to change her amlodipine to the evening.  Echocardiogram performed 02/16/2021 revealed LVEF 60 to 65%, mid LV cavitary dynamic gradient due to hyperdynamic LVEF, no RWMA, grade 1 diastolic dysfunction, small pericardial effusion, aortic valve sclerosis without stenosis.  Carotid duplex 02/2021 with bilateral vessels near normal with only minimal thickening or plaque. ? ?Seen in follow-up 03/1221 by Dr. RaTommie Raymondeeling overall well without blood pressure elevated.  Irbesartan was increased to 150 mg twice daily.  She was to continue amlodipine. ? ?She presents today for follow-up. Shares with me that she is on her 4th set of physical therapy. Has completed back and now is working on her knee. She is ery appreciative of their services. She is also participating in exercise at  [facility]. Tells me her blood pressure has been "wonky" the last couple days. Recently got a new upper arm blood pressure cuff from [employer]. Her husband has started checking it manually, he is a retired Therapist, sports. She feels overall well. Notes feels exercise is helping her energy level overall . She is taking Amlodipine in the evening at 6pm. Taking Irbesartan at 6am and 6pm. Reports no lightheadedness, dizziness. Has been  cautious to move slow.  ? ?Date BP  ?05/23/21 124/62  ?05/24/21 139/64  ?05/25/21 115/58  ?05/27/21 123/64  ?05/28/21 113/64 and 121/64  ?05/29/21 146/71  ?05/30/21 119/64  ? 191/92 (after exercise at [facility] manual)  ? 140/80 (manual)  ? 160/80 (manual)  ? ?BP checked in clinic: ?Manual 144/82 ?New BP cuff 153/80  ?Old BP cuff 156/71 ? ?EKGs/Labs/Other Studies Reviewed:  ? ?The following studies were reviewed today: ? ?Carotid duplex 02/2021 ?Summary:  ?Right Carotid: The extracranial vessels were near-normal with only minimal  ?wall thickening or plaque.  ? ?Left Carotid: The extracranial vessels were near-normal with only minimal  ?wall  thickening or plaque.  ? ?Vertebrals:  Bilateral vertebral arteries demonstrate antegrade flow.  ?Subclavians: Normal flow hemodynamics were seen in bilateral subclavian arteries.  ? ?Echo 02/2021 ?1. There is a mid LV cavitary dynamic gradient due to hyperdynamic LVF. Marland Kitchen  ?Left ventricular ejection fraction, by estimation, is 60 to 65%. Left  ?ventricular ejection fraction by 3D volume is 64 %. The left ventricle has  ?normal function. The left  ?ventricle has no regional wall motion abnormalities. Left ventricular  ?diastolic parameters are consistent with Grade I diastolic dysfunction  ?(impaired relaxation). The average left ventricular global longitudinal  ?strain is -16.8 %. The global  ?longitudinal strain is normal.  ? 2. Right ventricular systolic function is normal. The right ventricular  ?size is normal. There is normal pulmonary artery systolic pressure. The  ?estimated right ventricular systolic pressure is 9.1 mmHg.  ? 3. The pericardial effusion is anterior to the right ventricle.  ? 4. The mitral valve is normal in structure. No evidence of mitral valve  ?regurgitation. No evidence of mitral stenosis.  ? 5. The aortic valve is calcified. Aortic valve regurgitation is not  ?visualized. Aortic valve sclerosis/calcification is present, without any  ?evidence of aortic  stenosis. Aortic valve area, by VTI measures 1.88 cm?Marland Kitchen  ?Aortic valve mean gradient measures  ?5.3 mmHg. Aortic valve Vmax measures 1.64 m/s.  ? 6. The inferior vena cava is normal in size with greater than 50%  ?respiratory variability, suggesting right atrial pressure of 3 mmHg.  ? ?Comparison(s): EF 60%, mild MAC, moderate aortic sclerosis with no  ?evidence of stenosis, IAS lipomatous.  ? ?12/29/2020 CT Chest new small pericardial effusion noted.  ? ?FINDINGS  ? Left Ventricle: There is a mid LV cavitary dynamic gradient due to  ?hyperdynamic LVF. Left ventricular ejection fraction, by estimation, is 60  ?to 65%. Left ventricular ejection fraction by 3D volume is 64 %. The left  ?ventricle has normal function. The  ?left ventricle has no regional wall motion abnormalities. The average left  ?ventricular global longitudinal strain is -16.8 %. The global longitudinal  ?strain is normal. The left ventricular internal cavity size was normal in  ?size. There is no left  ?ventricular hypertrophy. Left ventricular diastolic parameters are  ?consistent with Grade I diastolic dysfunction (impaired relaxation).  ? ?Right Ventricle: The right ventricular size is normal. No increase in  ?right ventricular wall thickness. Right ventricular systolic function is  ?  normal. There is normal pulmonary artery systolic pressure. The tricuspid  ?regurgitant velocity is 1.23 m/s, and  ? with an assumed right atrial pressure of 3 mmHg, the estimated right  ?ventricular systolic pressure is 9.1 mmHg.  ? ?Left Atrium: Left atrial size was normal in size.  ? ?Right Atrium: Right atrial size was normal in size.  ? ?Pericardium: Trivial pericardial effusion is present. The pericardial  ?effusion is anterior to the right ventricle.  ? ?Mitral Valve: The mitral valve is normal in structure. Mild mitral annular  ?calcification. No evidence of mitral valve regurgitation. No evidence of  ?mitral valve stenosis.  ? ?Tricuspid Valve: The tricuspid  valve is normal in structure. Tricuspid  ?valve regurgitation is not demonstrated. No evidence of tricuspid  ?stenosis.  ? ?Aortic Valve: The aortic valve is calcified. Aortic valve regurgitation is  ?not vis

## 2021-06-01 ENCOUNTER — Encounter (HOSPITAL_BASED_OUTPATIENT_CLINIC_OR_DEPARTMENT_OTHER): Payer: Self-pay

## 2021-06-01 DIAGNOSIS — R030 Elevated blood-pressure reading, without diagnosis of hypertension: Secondary | ICD-10-CM

## 2021-06-01 DIAGNOSIS — I150 Renovascular hypertension: Secondary | ICD-10-CM

## 2021-06-04 ENCOUNTER — Encounter: Payer: Medicare Other | Admitting: Physical Therapy

## 2021-06-05 ENCOUNTER — Ambulatory Visit: Payer: Medicare Other | Admitting: Physical Therapy

## 2021-06-05 ENCOUNTER — Telehealth: Payer: Self-pay | Admitting: *Deleted

## 2021-06-05 NOTE — Telephone Encounter (Signed)
Patient scheduled, Wednesday, 06/06/21 '@11'$ :30 AM for 24 hour ambulatory blood pressure monitor. ?

## 2021-06-06 ENCOUNTER — Other Ambulatory Visit: Payer: Self-pay

## 2021-06-06 ENCOUNTER — Ambulatory Visit (INDEPENDENT_AMBULATORY_CARE_PROVIDER_SITE_OTHER): Payer: Medicare Other

## 2021-06-06 DIAGNOSIS — I150 Renovascular hypertension: Secondary | ICD-10-CM

## 2021-06-06 DIAGNOSIS — R030 Elevated blood-pressure reading, without diagnosis of hypertension: Secondary | ICD-10-CM

## 2021-06-06 NOTE — Progress Notes (Unsigned)
24 Hour ambulatory blood pressure monitor applied to patients left arm using standard adult cuff. ?

## 2021-06-07 ENCOUNTER — Encounter (HOSPITAL_BASED_OUTPATIENT_CLINIC_OR_DEPARTMENT_OTHER): Payer: Self-pay

## 2021-06-07 ENCOUNTER — Ambulatory Visit: Payer: Medicare Other | Admitting: Physical Therapy

## 2021-06-07 DIAGNOSIS — M6281 Muscle weakness (generalized): Secondary | ICD-10-CM

## 2021-06-07 DIAGNOSIS — R278 Other lack of coordination: Secondary | ICD-10-CM

## 2021-06-07 NOTE — Therapy (Signed)
[facility] ?[address] ?[place], [place], [address] ?Phone: [phone]   Fax:  [phone] ? ?Physical Therapy Treatment ? ?Patient Details  ?Name: Kristen Suarez ?MRN: [id number] ?Date of Birth: 1946-03-12 ?Referring Provider (PT): Georgina Snell ? ? ?Encounter Date: 06/07/2021 ? ? PT End of Session - 06/07/21 0919   ? ? Visit Number 15   ? Number of Visits 22   ? Date for PT Re-Evaluation 06/28/21   ? Authorization Type [facility]   ? PT Start Time [phone]   ? PT Stop Time 0925   ? PT Time Calculation (min) 43 min   ? ?  ?  ? ?  ? ? ?Past Medical History:  ?Diagnosis Date  ? Arthritis   ? Asthma   ? CAD in native artery 08/15/2020  ? Chickenpox   ? Colon polyps 2010  ? Epistaxis 03/29/2021  ? Heart murmur   ? History of fainting spells of unknown cause   ? Hyperlipidemia   ? Hypertension   ? Sarcoid 06/16/1976  ? ? ?Past Surgical History:  ?Procedure Laterality Date  ? ABDOMINAL HYSTERECTOMY  1987  ? BASAL CELL CARCINOMA EXCISION  06/30/2020  ? nose  ? LUNG BIOPSY  06/16/1976  ? PERIPHERAL VASCULAR INTERVENTION Right 11/29/2020  ? Procedure: PERIPHERAL VASCULAR INTERVENTION;  Surgeon: Wellington Hampshire, MD;  Location: [facility];  Service: Cardiovascular;  Laterality: Right;  Renal Artery  ? RENAL ANGIOGRAPHY N/A 11/29/2020  ? Procedure: RENAL ANGIOGRAPHY;  Surgeon: Wellington Hampshire, MD;  Location: [facility];  Service: Cardiovascular;  Laterality: N/A;  ? ? ?There were no vitals filed for this visit. ? ? Subjective Assessment - 06/07/21 0841   ? ? Subjective wearing BP monitor. knee hurt yesterday but better today. UB and LB ex at gym going well   ? Currently in Pain? No/denies   ? ?  ?  ? ?  ? ? ? ? ? ? ? ? ? ? ? ? ? ? ? ? ? ? ? ? [facility] Adult PT Treatment/Exercise - 06/07/21 0001   ? ?  ? Neck Exercises: Machines for Strengthening  ? Nustep L 4  11mn   ?  ? Knee/Hip Exercises: Standing  ? Hip Flexion Stengthening;Both;2 sets;20 reps;Knee bent   on BOSU  ?  Lateral Step Up Both;10 reps   laterally over foam mat  ? Forward Step Up Both;10 reps;Hand Hold: 0;Step Height: 6"   on foam mat  ? Functional Squat 10 reps   on BOSU  ? Walking with Sports Cord 20# 4 x laterally over roll- CGA   20# fwd with 6 in step up 2 each leg  ? Other Standing Knee Exercises hip 4 way 10 each with cable pulley 5#   very difficult for pt esp abd  ?  ? Knee/Hip Exercises: Seated  ? Ball Squeeze 15x   ? Clamshell with TheraBand Blue   ? Marching Strengthening;Both;20 reps   blue  ? ?  ?  ? ?  ? ? ? ? ? ? ? ? ? ? ? ? PT Short Term Goals - 05/17/21 1123   ? ?  ? PT SHORT TERM GOAL #1  ? Title independent with initial HEP   ? Time 1   ? Period Weeks   ? Status On-going   ? ?  ?  ? ?  ? ? ? ? PT Long Term Goals - 06/07/21 0917   ? ?  ?  PT LONG TERM GOAL #1  ? Title independent with advanced HEP   ? Baseline progressing at gym   ? Status Partially Met   ?  ? PT LONG TERM GOAL #2  ? Title decreased pain 50%   ? Baseline 90%   ? Status Achieved   ?  ? PT LONG TERM GOAL #3  ? Title Understand posture and body mechanics   ? Status Achieved   ?  ? PT LONG TERM GOAL #4  ? Title Increase cervical ROM 25%   ? Status Achieved   ?  ? PT LONG TERM GOAL #5  ? Title return to the gym safely   ? Baseline progressing   ? Status Partially Met   ?  ? PT LONG TERM GOAL #6  ? Title MMT in LEs to improve by 1 grade in all weak groups   ? Status On-going   ?  ? PT LONG TERM GOAL #7  ? Title Will report no more instances of R knee buckling and no more falls due to R knee pain   ? Status Achieved   ?  ? PT LONG TERM GOAL #8  ? Title Muscle flexibility to have improved by at least 50% in all tight groups   ? Status Partially Met   ?  ? PT LONG TERM GOAL  #9  ? TITLE Pain in R knee to be no more than 1/10 so that she can return to functional exercise safely   ? Status Partially Met   ? ?  ?  ? ?  ? ? ? ? ? ? ? ? Plan - 06/07/21 0920   ? ? Clinical Impression Statement progressed func strength and balance. hip weakness and  fatigue noted. minimal balance deficiets. progressing with goals and progressing at gym   ? PT Treatment/Interventions ADLs/Self Care Home Management;Electrical Stimulation;Cryotherapy;Moist Heat;Ultrasound;Neuromuscular re-education;Therapeutic exercise;Therapeutic activities;Functional mobility training;Stair training;Patient/family education;Manual techniques;Dry needling;Iontophoresis 62m/ml Dexamethasone;Gait training;Balance training   ? PT Next Visit Plan 2 sessions before likely DC- focus on balance, check BP ( being monitored today)   ? ?  ?  ? ?  ? ? ?Patient will benefit from skilled therapeutic intervention in order to improve the following deficits and impairments:  Decreased range of motion, Increased muscle spasms, Decreased activity tolerance, Pain, Postural dysfunction, Decreased strength, Impaired flexibility, Difficulty walking, Decreased mobility ? ?Visit Diagnosis: ?Muscle weakness (generalized) ? ?Other lack of coordination ? ? ? ? ?Problem List ?Patient Active Problem List  ? Diagnosis Date Noted  ? Snoring 04/13/2021  ? 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  ? Epistaxis 03/29/2021  ? CAD in native artery 08/15/2020  ? Basal cell carcinoma 06/30/2020  ? History of loop recorder 12/13/2019  ? Allergic rhinitis 12/08/2019  ? Cough 11/08/2019  ? Post-nasal drip 11/08/2019  ? Multiple joint pain 04/22/2019  ? Bilateral leg edema 04/22/2019  ? Vaginal atrophy 01/05/2019  ? Resting tremor 09/08/2018  ? Sarcoidosis 05/08/2018  ? HTN (hypertension) 05/08/2018  ? Colon polyp 05/08/2018  ? Family hx of colon cancer 05/08/2018  ? Cystocele, midline 05/08/2018  ? Hyperlipidemia 05/08/2018  ? Vasovagal syncope 05/08/2018  ? Seborrheic keratosis 05/08/2018  ? Pseudophakia of both eyes 05/08/2018  ? GERD (gastroesophageal reflux disease) 05/08/2018  ? COPD (chronic obstructive pulmonary disease) ([facility] 05/08/2018  ? ? ?Keenan Dimitrov,ANGIE, PTA ?06/07/2021, 9:21 AM ? ?[facility] ?[address] ?[place] [address]?Phone: [phone]  Fax:  [phone]? ?  Name: Kristen Suarez ?MRN: [id number] ?Date of Birth: [birth year] ? ? ? ?

## 2021-06-07 NOTE — Telephone Encounter (Signed)
BP logs as requested  ?

## 2021-06-08 ENCOUNTER — Other Ambulatory Visit: Payer: Self-pay

## 2021-06-08 ENCOUNTER — Ambulatory Visit: Payer: Medicare Other

## 2021-06-08 DIAGNOSIS — R0683 Snoring: Secondary | ICD-10-CM

## 2021-06-08 DIAGNOSIS — G4733 Obstructive sleep apnea (adult) (pediatric): Secondary | ICD-10-CM

## 2021-06-08 NOTE — Telephone Encounter (Signed)
Follow up.

## 2021-06-08 NOTE — Telephone Encounter (Signed)
Error

## 2021-06-11 ENCOUNTER — Encounter: Payer: Medicare Other | Admitting: Physical Therapy

## 2021-06-12 ENCOUNTER — Ambulatory Visit: Payer: Medicare Other | Admitting: Physical Therapy

## 2021-06-13 ENCOUNTER — Telehealth: Payer: Self-pay | Admitting: Pulmonary Disease

## 2021-06-13 DIAGNOSIS — G4733 Obstructive sleep apnea (adult) (pediatric): Secondary | ICD-10-CM | POA: Diagnosis not present

## 2021-06-13 NOTE — Telephone Encounter (Signed)
Call patient ? ?Sleep study result ? ?Date of study: ?06/08/2021 ? ?Impression: ?Mild obstructive sleep apnea ?Mild oxygen desaturations ? ?Recommendation: ? ?Options of treatment for mild obstructive sleep apnea will include ? ?1.  CPAP therapy if there is significant daytime sleepiness or other comorbidities including history of CVA or cardiac disease ? ?-If CPAP is chosen as an option of treatment auto titrating CPAP with a pressure setting of 5-15 will be appropriate ? ? ?2.  Watchful waiting with emphasis on weight loss measures, sleep position modification to optimize lateral sleep, elevating the head of the bed by about 30 degrees may also help. ? ? ?3.  An oral device may be fashioned for the treatment of mild sleep disordered breathing, will involve referral to dentist. ? ? ?Follow-up as previously scheduled ? ? ?FYI: Marland Kitchen ?

## 2021-06-14 ENCOUNTER — Ambulatory Visit: Payer: Medicare Other | Admitting: Physical Therapy

## 2021-06-14 DIAGNOSIS — M6281 Muscle weakness (generalized): Secondary | ICD-10-CM | POA: Diagnosis not present

## 2021-06-14 NOTE — Therapy (Signed)
[facility] ?[address] ?[place], [place], [address] ?Phone: [phone]   Fax:  [phone] ? ?Physical Therapy Treatment ? ?Patient Details  ?Name: Kristen Suarez ?MRN: [id number] ?Date of Birth: 14-Feb-1946 ?Referring Provider (PT): Georgina Snell ? ? ?Encounter Date: 06/14/2021 ? ? PT End of Session - 06/14/21 1749   ? ? Visit Number 16   ? Number of Visits 22   ? Date for PT Re-Evaluation 06/28/21   ? PT Start Time 0840   ? PT Stop Time 0926   ? PT Time Calculation (min) 46 min   ? ?  ?  ? ?  ? ? ?Past Medical History:  ?Diagnosis Date  ? Arthritis   ? Asthma   ? CAD in native artery 08/15/2020  ? Chickenpox   ? Colon polyps 2010  ? Epistaxis 03/29/2021  ? Heart murmur   ? History of fainting spells of unknown cause   ? Hyperlipidemia   ? Hypertension   ? Sarcoid 06/16/1976  ? ? ?Past Surgical History:  ?Procedure Laterality Date  ? ABDOMINAL HYSTERECTOMY  1987  ? BASAL CELL CARCINOMA EXCISION  06/30/2020  ? nose  ? LUNG BIOPSY  06/16/1976  ? PERIPHERAL VASCULAR INTERVENTION Right 11/29/2020  ? Procedure: PERIPHERAL VASCULAR INTERVENTION;  Surgeon: Wellington Hampshire, MD;  Location: [facility];  Service: Cardiovascular;  Laterality: Right;  Renal Artery  ? RENAL ANGIOGRAPHY N/A 11/29/2020  ? Procedure: RENAL ANGIOGRAPHY;  Surgeon: Wellington Hampshire, MD;  Location: [facility];  Service: Cardiovascular;  Laterality: N/A;  ? ? ?There were no vitals filed for this visit. ? ? Subjective Assessment - 06/14/21 0844   ? ? Subjective will get results of BP next week. arrived with folder of daily ex flow sheet fro review in prep of D/C. knee almost gave out on me yesterday. back pain come sand goes as high as 9 at times   ? Currently in Pain? Yes   ? Pain Score 3    ? Pain Location Back   ? ?  ?  ? ?  ? ? ? ? ? ? ? ? ? ? ? ? ? ? ? ? ? ? ? ? [facility] Adult PT Treatment/Exercise - 06/14/21 0001   ? ?  ? Neck Exercises: Machines for Strengthening  ? UBE (Upper Arm Bike) L  3 2 min fwd/2 min back   ? Nustep L 4  36mn   ?  ? Neck Exercises: Standing  ? Other Standing Exercises red tband scap stab 3 way 10 each   ? Other Standing Exercises wt ball chest press into OH ext 10x   ?  ? Knee/Hip Exercises: Machines for Strengthening  ? Cybex Leg Press 30# 2 sets 10   calf raises 30# 2 sets 10  ?  ? Knee/Hip Exercises: Standing  ? Walking with Sports Cord 20# 5 x laterally over roll- CGA   20# with 6 inch step up 3 x RT leg, 3 x left leg  ? Other Standing Knee Exercises marching fwd and back and lateral stepping with3# ankle wts on foam mat- CGA   HHA 3# alt hip flex,ext and abd 20 x  ?  ? Shoulder Exercises: Pulleys  ? Other Pulley Exercises cable pulley row and shld ext 2 sets 10   ? ?  ?  ? ?  ? ? ? ? ? ? ? ? ? ? PT Education - 06/14/21 0847   ? ?  Education Details reviewed HEPs and flow sheets for daily ex   ? Person(s) Educated Patient   ? Methods Explanation;Demonstration   ? Comprehension Verbalized understanding   ? ?  ?  ? ?  ? ? ? PT Short Term Goals - 05/17/21 1123   ? ?  ? PT SHORT TERM GOAL #1  ? Title independent with initial HEP   ? Time 1   ? Period Weeks   ? Status On-going   ? ?  ?  ? ?  ? ? ? ? PT Long Term Goals - 06/14/21 0915   ? ?  ? PT LONG TERM GOAL #1  ? Title independent with advanced HEP   ? Baseline progressing at gym   ? Status Partially Met   ?  ? PT LONG TERM GOAL #2  ? Title decreased pain 50%   ? Status Achieved   ?  ? PT LONG TERM GOAL #3  ? Title Understand posture and body mechanics   ? Status Achieved   ?  ? PT LONG TERM GOAL #4  ? Title Increase cervical ROM 25%   ? Status Achieved   ?  ? PT LONG TERM GOAL #5  ? Title return to the gym safely   ? Status Achieved   ?  ? PT LONG TERM GOAL #6  ? Title MMT in LEs to improve by 1 grade in all weak groups   ? Status Achieved   ?  ? PT LONG TERM GOAL #7  ? Title Will report no more instances of R knee buckling and no more falls due to R knee pain   ? Status Partially Met   ?  ? PT LONG TERM GOAL #8  ? Title  Muscle flexibility to have improved by at least 50% in all tight groups   ? Status Achieved   ?  ? PT LONG TERM GOAL  #9  ? TITLE Pain in R knee to be no more than 1/10 so that she can return to functional exercise safely   ? Status Partially Met   ? ?  ?  ? ?  ? ? ? ? ? ? ? ? Plan - 06/14/21 0918   ? ? Clinical Impression Statement progressed func, strength and balance/coordination. assistance as needed and cuing. overall progressing with goals. reviewed gym plan and daily ex HEPS -very comprehensive and good variety. pt still with upper back pain that comes and goes as well as inconsistant knee buckling   ? PT Treatment/Interventions ADLs/Self Care Home Management;Electrical Stimulation;Cryotherapy;Moist Heat;Ultrasound;Neuromuscular re-education;Therapeutic exercise;Therapeutic activities;Functional mobility training;Stair training;Patient/family education;Manual techniques;Dry needling;Iontophoresis 66m/ml Dexamethasone;Gait training;Balance training   ? PT Next Visit Plan plan to D/C next visit unles nay changes   ? ?  ?  ? ?  ? ? ?Patient will benefit from skilled therapeutic intervention in order to improve the following deficits and impairments:  Decreased range of motion, Increased muscle spasms, Decreased activity tolerance, Pain, Postural dysfunction, Decreased strength, Impaired flexibility, Difficulty walking, Decreased mobility ? ?Visit Diagnosis: ?Muscle weakness (generalized) ? ? ? ? ?Problem List ?Patient Active Problem List  ? Diagnosis Date Noted  ? Snoring 04/13/2021  ? 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  ? Epistaxis 03/29/2021  ? CAD in native artery 08/15/2020  ? Basal cell carcinoma 06/30/2020  ? History of loop recorder 12/13/2019  ? Allergic rhinitis 12/08/2019  ? Cough 11/08/2019  ? Post-nasal drip 11/08/2019  ? Multiple joint pain 04/22/2019  ? Bilateral leg edema 04/22/2019  ?  Vaginal atrophy 01/05/2019  ? Resting tremor 09/08/2018  ? Sarcoidosis 05/08/2018  ? HTN (hypertension) 05/08/2018   ? Colon polyp 05/08/2018  ? Family hx of colon cancer 05/08/2018  ? Cystocele, midline 05/08/2018  ? Hyperlipidemia 05/08/2018  ? Vasovagal syncope 05/08/2018  ? Seborrheic keratosis 05/08/2018  ? Pseudophakia of both eyes 05/08/2018  ? GERD (gastroesophageal reflux disease) 05/08/2018  ? COPD (chronic obstructive pulmonary disease) ([facility]) 05/08/2018  ? ? ?Kristen Suarez,Kristen Suarez, Kristen Suarez ?06/14/2021, 9:23 AM ? ?[facility] ?[address] ?[place], [place], [address] ?Phone: [phone]   Fax:  [phone] ? ?Name: Kristen Suarez ?MRN: [id number] ?Date of Birth: Jul 12, 1945 ? ? ? ?

## 2021-06-14 NOTE — Telephone Encounter (Signed)
Patient was notified of her HST results earlier today.  ?

## 2021-06-14 NOTE — Telephone Encounter (Signed)
I called the patient and gave her the recommendations from the provider and she is going to discuss with her spouse and send a message once she makes a decision. Nothing further needed.  ?

## 2021-06-19 ENCOUNTER — Ambulatory Visit: Payer: Medicare Other | Admitting: Physical Therapy

## 2021-06-19 ENCOUNTER — Ambulatory Visit (HOSPITAL_COMMUNITY)
Admission: RE | Admit: 2021-06-19 | Discharge: 2021-06-19 | Disposition: A | Discharge status: Home / Self Care | Payer: Medicare Other | Source: Ambulatory Visit | Attending: Cardiovascular Disease | Admitting: Cardiovascular Disease

## 2021-06-19 DIAGNOSIS — Z48812 Encounter for surgical aftercare following surgery on the circulatory system: Secondary | ICD-10-CM

## 2021-06-19 DIAGNOSIS — Z9889 Other specified postprocedural states: Secondary | ICD-10-CM | POA: Insufficient documentation

## 2021-06-21 ENCOUNTER — Ambulatory Visit: Payer: Medicare Other | Attending: Family Medicine | Admitting: Physical Therapy

## 2021-06-21 DIAGNOSIS — M6281 Muscle weakness (generalized): Secondary | ICD-10-CM

## 2021-06-21 DIAGNOSIS — M62830 Muscle spasm of back: Secondary | ICD-10-CM

## 2021-06-21 NOTE — Therapy (Signed)
[facility] ?[place] [address] ?[place], [place], [address] ?Phone: [phone]   Fax:  [phone] ? ?Physical Therapy Treatment ? ?Patient Details  ?Name: Kristen Suarez ?MRN: [id number] ?Date of Birth: [birth year] ?Referring Provider (PT): Georgina Snell ? ? ?Encounter Date: 06/21/2021 ? ? PT End of Session - 06/21/21 0924   ? ? Visit Number 17   ? Number of Visits 22   ? Date for PT Re-Evaluation 06/28/21   ? PT Start Time 0840   ? PT Stop Time [phone]   ? PT Time Calculation (min) 43 min   ? ?  ?  ? ?  ? ? ?Past Medical History:  ?Diagnosis Date  ? Arthritis   ? Asthma   ? CAD in native artery 08/15/2020  ? Chickenpox   ? Colon polyps 2010  ? Epistaxis 03/29/2021  ? Heart murmur   ? History of fainting spells of unknown cause   ? Hyperlipidemia   ? Hypertension   ? Sarcoid 06/16/1976  ? ? ?Past Surgical History:  ?Procedure Laterality Date  ? ABDOMINAL HYSTERECTOMY  1987  ? BASAL CELL CARCINOMA EXCISION  06/30/2020  ? nose  ? LUNG BIOPSY  06/16/1976  ? PERIPHERAL VASCULAR INTERVENTION Right 11/29/2020  ? Procedure: PERIPHERAL VASCULAR INTERVENTION;  Surgeon: Wellington Hampshire, MD;  Location: [facility];  Service: Cardiovascular;  Laterality: Right;  Renal Artery  ? RENAL ANGIOGRAPHY N/A 11/29/2020  ? Procedure: RENAL ANGIOGRAPHY;  Surgeon: Wellington Hampshire, MD;  Location: [facility];  Service: Cardiovascular;  Laterality: N/A;  ? ? ?There were no vitals filed for this visit. ? ? Subjective Assessment - 06/21/21 0839   ? ? Subjective still no BP results. aches and pains but doing better- ups and downs.   ? Currently in Pain? Yes   ? Pain Score 3    ? Pain Location Neck   ? ?  ?  ? ?  ? ? ? ? ? ? ? ? ? ? ? ? ? ? ? ? ? ? ? ? [facility] Adult PT Treatment/Exercise - 06/21/21 0001   ? ?  ? Neck Exercises: Machines for Strengthening  ? Nustep L 4  26min   ?  ? Neck Exercises: Theraband  ? Other Theraband Exercises squat with bicep curl green tband   ? Other Theraband Exercises  AR press and rotation with cable pulleys 10 each   ?  ? Knee/Hip Exercises: Standing  ? Walking with Sports Cord 20# with 6 inch step up fwd 3 x each leg and then laterally 3 x each side   ? Other Standing Knee Exercises farmers carry 8# 2 laps each hand   3# march with chest press 15 feet 2 x then same with [facility] press  ? Other Standing Knee Exercises pulley squat row 10 # 10 x, 2nd set squat shld ext 10 x   ? ?  ?  ? ?  ? ? ? ? ? ? ? ? ? ? ? ? PT Short Term Goals - 05/17/21 1123   ? ?  ? PT SHORT TERM GOAL #1  ? Title independent with initial HEP   ? Time 1   ? Period Weeks   ? Status On-going   ? ?  ?  ? ?  ? ? ? ? PT Long Term Goals - 06/21/21 0849   ? ?  ? PT LONG TERM GOAL #1  ? Title independent with advanced  HEP   ? Status Achieved   ?  ? PT LONG TERM GOAL #7  ? Title Will report no more instances of R knee buckling and no more falls due to R knee pain   ? Status Achieved   ?  ? PT LONG TERM GOAL #8  ? Title Muscle flexibility to have improved by at least 50% in all tight groups   ? Status Achieved   ?  ? PT LONG TERM GOAL  #9  ? TITLE Pain in R knee to be no more than 1/10 so that she can return to functional exercise safely   ? Status Achieved   ? ?  ?  ? ?  ? ? ? ? ? ? ? ? Plan - 06/21/21 0924   ? ? Clinical Impression Statement all goals met. good HEP and gym program est   ? PT Treatment/Interventions ADLs/Self Care Home Management;Electrical Stimulation;Cryotherapy;Moist Heat;Ultrasound;Neuromuscular re-education;Therapeutic exercise;Therapeutic activities;Functional mobility training;Stair training;Patient/family education;Manual techniques;Dry needling;Iontophoresis 97m/ml Dexamethasone;Gait training;Balance training   ? PT Next Visit Plan D/C   ? ?  ?  ? ?  ? ? ?Patient will benefit from skilled therapeutic intervention in order to improve the following deficits and impairments:  Decreased range of motion, Increased muscle spasms, Decreased activity tolerance, Pain, Postural dysfunction, Decreased  strength, Impaired flexibility, Difficulty walking, Decreased mobility ? ?Visit Diagnosis: ?Muscle weakness (generalized) ? ?Muscle spasm of back ? ? ? ? ?Problem List ?Patient Active Problem List  ? Diagnosis Date Noted  ? Snoring 04/13/2021  ? 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  ? Epistaxis 03/29/2021  ? CAD in native artery 08/15/2020  ? Basal cell carcinoma 06/30/2020  ? History of loop recorder 12/13/2019  ? Allergic rhinitis 12/08/2019  ? Cough 11/08/2019  ? Post-nasal drip 11/08/2019  ? Multiple joint pain 04/22/2019  ? Bilateral leg edema 04/22/2019  ? Vaginal atrophy 01/05/2019  ? Resting tremor 09/08/2018  ? Sarcoidosis 05/08/2018  ? HTN (hypertension) 05/08/2018  ? Colon polyp 05/08/2018  ? Family hx of colon cancer 05/08/2018  ? Cystocele, midline 05/08/2018  ? Hyperlipidemia 05/08/2018  ? Vasovagal syncope 05/08/2018  ? Seborrheic keratosis 05/08/2018  ? Pseudophakia of both eyes 05/08/2018  ? GERD (gastroesophageal reflux disease) 05/08/2018  ? COPD (chronic obstructive pulmonary disease) ([facility] 05/08/2018  ? ?PHYSICAL THERAPY DISCHARGE SUMMARY ? ? ?Patient agrees to discharge. Patient goals were met. Patient is being discharged due to meeting the stated rehab goals. ? ?Kristen Suarez,Kristen Suarez, PTA ?06/21/2021, 9:25 AM ? ?[place] ?[address] ?[place] [address]?Phone: [phone]  Fax:  [phone]? ?Name: FAmbyr QadriApollo ?MRN: [id number]?Date of Birth: [birth year]? ? ? ?

## 2021-06-25 ENCOUNTER — Other Ambulatory Visit: Payer: Self-pay | Admitting: *Deleted

## 2021-06-25 ENCOUNTER — Encounter: Payer: Self-pay | Admitting: *Deleted

## 2021-06-25 DIAGNOSIS — I701 Atherosclerosis of renal artery: Secondary | ICD-10-CM

## 2021-06-26 ENCOUNTER — Ambulatory Visit (INDEPENDENT_AMBULATORY_CARE_PROVIDER_SITE_OTHER): Payer: Medicare Other | Admitting: Family Medicine

## 2021-06-26 ENCOUNTER — Encounter: Payer: Self-pay | Admitting: Family Medicine

## 2021-06-26 ENCOUNTER — Ambulatory Visit (INDEPENDENT_AMBULATORY_CARE_PROVIDER_SITE_OTHER): Payer: Medicare Other

## 2021-06-26 VITALS — BP 130/74 | HR 74 | Ht 63.0 in | Wt 146.2 lb

## 2021-06-26 DIAGNOSIS — M546 Pain in thoracic spine: Secondary | ICD-10-CM

## 2021-06-26 DIAGNOSIS — I701 Atherosclerosis of renal artery: Secondary | ICD-10-CM | POA: Diagnosis not present

## 2021-06-26 DIAGNOSIS — G8929 Other chronic pain: Secondary | ICD-10-CM | POA: Diagnosis not present

## 2021-06-26 NOTE — Progress Notes (Signed)
? ?  I, [name], LAT, ATC, am serving as scribe for Dr. [name]. ? ?Kristen Suarez is a 76 y.o. female who presents to [facility] [facility] at [facility] today for f/u R knee and thoracic back pain. Pt was last seen by Dr. Georgina Snell on 05/14/21 and was given a R knee steroid injection and advised to use Voltaren gel and work on quad strengthening w/ PT (in additional to them working on her back), completing 17 total visits. Today, pt reports her R knee is feeling better but her mid-back is not.  She has finished PT and has been d/c.  She has been doing some home exercises but states that she does not have very good handouts for her HEP.  She states that if she doesn't use heat and Voltaren gel daily, then she con't to have pain.  She wants to discuss options for her mid-back moving forward. ? ?Her husband is having a lumbar spine laminectomy next week.  She would like to prioritize his health and recovery first before she proceeds with more advanced evaluation and treatment with her thoracic spine pain. ? ?Dx imaging: 05/14/21 R knee XR ? ?Pertinent review of systems: No fevers or chills ? ?Relevant historical information: COPD and hypertension.  Sarcoidosis. ? ? ?Exam:  ?BP 130/74 (BP Location: Right Arm, Patient Position: Sitting, Cuff Size: Normal)   Pulse 74   Ht '5\' 3"'$  (1.6 m)   Wt 146 lb 3.2 oz (66.3 kg)   SpO2 96%   BMI 25.90 kg/m?  ?General: Well Developed, well nourished, and in no acute distress.  ? ?MSK: T-spine: Kyphosis.  Nontender midline.  Tender palpation left thoracic paraspinal musculature. ? ? ? ?Lab and Radiology Results ? ?X-ray images T-spine obtained today personally and independently interpreted. ?X-rays compared to CT scan chest October 2022. ?No acute compression fractures are visible per my read ?Await formal radiology review ? ? ? ? ?Assessment and Plan: ?76 y.o. female with chronic thoracic pain thought to be due to some degenerative changes as well as perispinal and  rhomboid musculature.  She is already done a fair amount of physical therapy and has plateaued.  Plan to continue heating pad and home exercise program.  Next step should be MRI to further evaluate source of thoracic pain and to potentially plan for injection.  She will let me know when she is ready to proceed to MRI. ? ? ?PDMP not reviewed this encounter. ?Orders Placed This Encounter  ?Procedures  ? DG Thoracic Spine W/Swimmers  ?  Standing Status:   Future  ?  Number of Occurrences:   1  ?  Standing Expiration Date:   06/27/2022  ?  Order Specific Question:   Reason for Exam (SYMPTOM  OR DIAGNOSIS REQUIRED)  ?  Answer:   thoracic back pain  ?  Order Specific Question:   Preferred imaging location?  ?  Answer:   [facility]  ? ?No orders of the defined types were placed in this encounter. ? ? ? ?Discussed warning signs or symptoms. Please see discharge instructions. Patient expresses understanding. ? ? ?The above documentation has been reviewed and is accurate and complete [name], M.D. ? ? ?

## 2021-06-26 NOTE — Patient Instructions (Addendum)
Good to see you today. ? ?Please get an Xray today before you leave  ? ?Let me know if you want a MRI in the future. ? ?Follow-up: as needed ?

## 2021-06-27 NOTE — Progress Notes (Signed)
Thoracic spine x-ray shows some arthritis changes and some scoliosis but no fractures are visible.

## 2021-06-30 ENCOUNTER — Other Ambulatory Visit: Payer: Self-pay | Admitting: Nurse Practitioner

## 2021-07-02 ENCOUNTER — Telehealth (HOSPITAL_BASED_OUTPATIENT_CLINIC_OR_DEPARTMENT_OTHER): Payer: Self-pay

## 2021-07-02 MED ORDER — HYDROCHLOROTHIAZIDE 12.5 MG PO CAPS: 12.5000 mg | ORAL_CAPSULE | Freq: Every day | ORAL | 3 refills | Status: DC

## 2021-07-02 NOTE — Telephone Encounter (Addendum)
Results called to patient who verbalizes understanding!  ? ? ? ? ?----- Message from Loel Dubonnet, NP sent at 07/02/2021  7:36 AM EDT ----- ?24-hour blood pressure monitor with average BP 147/73. Recommend addition of HCTZ 12.'5mg'$  QD and MyChart message in 2 weeks to reassess BP. ?

## 2021-07-03 NOTE — Telephone Encounter (Signed)
Chart supports rx refill ?Last ov: 01/29/21 ?Last refill: 03/30/21 ?FOV: 07/31/21 ?

## 2021-07-16 ENCOUNTER — Encounter (HOSPITAL_BASED_OUTPATIENT_CLINIC_OR_DEPARTMENT_OTHER): Payer: Self-pay

## 2021-07-16 NOTE — Telephone Encounter (Signed)
BP log as requested 

## 2021-07-17 ENCOUNTER — Encounter: Payer: Self-pay | Admitting: Emergency Medicine

## 2021-07-17 ENCOUNTER — Ambulatory Visit (INDEPENDENT_AMBULATORY_CARE_PROVIDER_SITE_OTHER): Payer: Medicare Other | Admitting: Emergency Medicine

## 2021-07-17 DIAGNOSIS — D869 Sarcoidosis, unspecified: Secondary | ICD-10-CM

## 2021-07-17 DIAGNOSIS — I701 Atherosclerosis of renal artery: Secondary | ICD-10-CM

## 2021-07-17 DIAGNOSIS — J42 Unspecified chronic bronchitis: Secondary | ICD-10-CM | POA: Diagnosis not present

## 2021-07-17 DIAGNOSIS — R053 Chronic cough: Secondary | ICD-10-CM

## 2021-07-17 DIAGNOSIS — R0683 Snoring: Secondary | ICD-10-CM | POA: Diagnosis not present

## 2021-07-17 NOTE — Assessment & Plan Note (Signed)
Treating both her GERD and her rhinitis, obstructive lung disease ? ?Continue your Nasonex, nasal saline spray, Zyrtec as you have been using them ?Continue Protonix and Pepcid as you have been using ?

## 2021-07-17 NOTE — Assessment & Plan Note (Signed)
Please continue your Breztri 2 puffs twice a day ?Keep your albuterol available to use 2 puffs if needed for shortness of breath, chest tightness, wheezing. ?Agree with getting back to your usual exercise routine through [facility]. ?Follow with Dr Lamonte Sakai in 6 months or sooner if you have any problems ? ?

## 2021-07-17 NOTE — Patient Instructions (Addendum)
We reviewed sleep study today.  There is very mild sleep apnea present.  We will hold off on starting CPAP for now.  Could consider in the future depending on how symptoms evolve. ?Please continue your Breztri 2 puffs twice a day ?Keep your albuterol available to use 2 puffs if needed for shortness of breath, chest tightness, wheezing. ?We will discuss the timing of any repeat chest x-ray or CT chest to follow stable sarcoidosis at your next office visit ?Continue your Nasonex, nasal saline spray, Zyrtec as you have been using them ?Continue Protonix and Pepcid as you have been using ?Agree with getting back to your usual exercise routine through [place]. ?Follow with Dr Lamonte Sakai in 6 months or sooner if you have any problems ? ? ?

## 2021-07-17 NOTE — Progress Notes (Signed)
? ?Subjective:  ? ? Patient ID: Kristen Suarez, female    DOB: 03-25-1945, 76 y.o.   MRN: [id number] ? ?HPI ? ?ROV 01/16/21 --follow-up visit 76 year old woman with a history of sarcoidosis and associated severe obstructive lung disease, superimposed restrictive lung disease.  She has chronic cough due to her obstructive disease, GERD, chronic allergic rhinitis.  Currently managed on Breztri.  She uses albuterol approximately 3x a day, more than her usual ?She is on Nasonex, Zyrtec.  Protonix 40 mg daily, Pepcid as needed ?Reports that she has had some increased dry cough over the last week, no infectious prodrome or sx. Seems to be associated with increased allergies and congestion. She has used some hydrocodone syrup, cough drops. She has had some hypotension and has had syncope x2 as her BP meds have been adjusted.  ? ?CT scan of the chest performed 12/29/2020 reviewed by me, shows unchanged mediastinal, hilar adenopathy, unchanged extensive mid lobe predominant traction bronchiectasis and architectural distortion.  There is a new small pericardial effusion.  Emphysematous change. ? ? ?ROV 07/17/21 --76 year old woman whom I have followed for sarcoidosis with associated severe obstructive lung disease, restrictive lung disease.  She also has chronic cough in the setting of this as well as chronic allergic rhinitis and GERD.  She has had repeat PFT as below, most recent chest CT 12/2020.  She also underwent a home sleep study 06/08/2021 that showed mild OSA.  Currently managed on breztri albuterol as needed, Nasonex, Protonix,, Pepcid if needed, saline nasal spray, Zyrtec ?Treated for acute flares in November in setting of URI ?Husband hears some snoring, has heard some breathing pauses. She notes that she is up frequently to use rest room. Is concerned about whether she would be able to incorporate CPAP into her nights. She complains of low energy w exertion.  ? ?Pulmonary function testing 04/09/2021 reviewed by me  shows very severe obstruction without a bronchodilator response, FEV1 49% predicted.  Restricted lung volumes, decreased diffusion capacity that does correct for alveolar volume. ? ?Home sleep test 06/08/2021 reviewed, shows mild OSA and mild desaturations.  AHI 5.1/h, lowest saturation 83%. ? ? ? ?Review of Systems  ?Constitutional:  Negative for fever and unexpected weight change.  ?HENT:  Negative for congestion, dental problem, ear pain, nosebleeds, postnasal drip, rhinorrhea, sinus pressure, sneezing, sore throat and trouble swallowing.   ?Eyes:  Negative for redness and itching.  ?Respiratory:  Positive for cough and shortness of breath. Negative for chest tightness and wheezing.   ?Cardiovascular:  Negative for palpitations and leg swelling.  ?Gastrointestinal:  Negative for nausea and vomiting.  ?Genitourinary:  Negative for dysuria.  ?Musculoskeletal:  Negative for joint swelling.  ?Skin:  Negative for rash.  ?Neurological:  Negative for headaches.  ?Hematological:  Does not bruise/bleed easily.  ?Psychiatric/Behavioral:  Negative for dysphoric mood. The patient is not nervous/anxious.   ? ?   ?Objective:  ? Physical Exam ?Vitals:  ? 07/17/21 0905  ?BP: 138/76  ?Pulse: 76  ?Temp: 98 ?F (36.7 ?C)  ?TempSrc: Oral  ?SpO2: 98%  ?Weight: 145 lb 6.4 oz (66 kg)  ?Height: '5\' 3"'$  (1.6 m)  ? ?Gen: Pleasant, well-nourished, in no distress,  normal affect ? ?ENT: No lesions,  mouth clear,  oropharynx clear, no postnasal drip ? ?Neck: No JVD, no upper airway noise ? ?Lungs: No use of accessory muscles, good excursion, B insp crackles, end exp wheezes.  ? ?Cardiovascular: RRR, heart sounds normal, no murmur or gallops, no peripheral edema ? ?  Musculoskeletal: No deformities, no cyanosis or clubbing ? ?Neuro: alert, awake, non focal ? ?Skin: Warm, no lesions or rash ? ? ?   ?Assessment & Plan:  ?Snoring ?She does have decreased energy during the day.  Does not nap.  Unclear whether mild OSA is related to the symptoms versus  impact of her underlying lung disease, deconditioning.  For now we will hold off on CPAP although would consider going forward depending on how things evolve. ? ?We reviewed sleep study today.  There is very mild sleep apnea present.  We will hold off on starting CPAP for now.  Could consider in the future depending on how symptoms evolve. ? ? ?COPD (chronic obstructive pulmonary disease) ([place]) ?Please continue your Breztri 2 puffs twice a day ?Keep your albuterol available to use 2 puffs if needed for shortness of breath, chest tightness, wheezing. ?Agree with getting back to your usual exercise routine through [place]. ?Follow with Dr Lamonte Sakai in 6 months or sooner if you have any problems ? ? ?Sarcoidosis ?No sequela of active sarcoidosis on imaging.  Pulmonary function testing with severe obstruction, treating her COPD as above. ? ?We will discuss the timing of any repeat chest x-ray or CT chest to follow stable sarcoidosis at your next office visit ? ?Cough ?Treating both her GERD and her rhinitis, obstructive lung disease ? ?Continue your Nasonex, nasal saline spray, Zyrtec as you have been using them ?Continue Protonix and Pepcid as you have been using ? ?Baltazar Apo, MD, PhD ?07/17/2021, 1:22 PM ?[facility] ?[phone] or if no answer [phone] ? ?

## 2021-07-17 NOTE — Assessment & Plan Note (Signed)
No sequela of active sarcoidosis on imaging.  Pulmonary function testing with severe obstruction, treating her COPD as above. ? ?We will discuss the timing of any repeat chest x-ray or CT chest to follow stable sarcoidosis at your next office visit ?

## 2021-07-17 NOTE — Assessment & Plan Note (Signed)
She does have decreased energy during the day.  Does not nap.  Unclear whether mild OSA is related to the symptoms versus impact of her underlying lung disease, deconditioning.  For now we will hold off on CPAP although would consider going forward depending on how things evolve. ? ?We reviewed sleep study today.  There is very mild sleep apnea present.  We will hold off on starting CPAP for now.  Could consider in the future depending on how symptoms evolve. ? ?

## 2021-07-19 NOTE — Progress Notes (Signed)
Office Visit Note  Patient: Kristen Suarez             Date of Birth: 07/23/1945           MRN: [id number]             PCP: Flossie Buffy, NP Referring: Flossie Buffy, NP Visit Date: 08/02/2021 Occupation: '@GUAROCC'$ @  Subjective:  No chief complaint on file.   History of Present Illness: Kristen Suarez is a 76 y.o. female with history of sarcoidosis, osteoarthritis and degenerative disc disease.  She states in January 2023 she started having thoracic pain.  At the time she was referred to Dr. Georgina Snell.  Dr. Georgina Snell evaluated her and referred her to physical therapy.  She had some relief from physical therapy but she still have thoracic pain.  She is scheduled to have MRI of the thoracic region.  She was also experiencing pain and discomfort in her knee joint.  She had a cortisone injection in her right knee in February by Dr. Georgina Snell.  The knee joint pain has resolved.  She was recently evaluated by Dr. Lamonte Sakai.  Who did not find any active sarcoidosis.  She has severe COPD.  Activities of Daily Living:  Patient reports morning stiffness for 10 minutes.   Patient Denies nocturnal pain.  Difficulty dressing/grooming: Denies Difficulty climbing stairs: Reports due to SOB Difficulty getting out of chair: Denies Difficulty using hands for taps, buttons, cutlery, and/or writing: Denies  Review of Systems  Constitutional:  Positive for fatigue.  HENT:  Negative for mouth sores and mouth dryness.   Eyes:  Negative for dryness.  Respiratory:  Positive for shortness of breath.   Cardiovascular:  Positive for hypertension. Negative for palpitations.  Gastrointestinal:  Negative for constipation and diarrhea.  Endocrine: Negative for increased urination.  Genitourinary:  Positive for nocturia.  Musculoskeletal:  Positive for morning stiffness. Negative for joint pain, gait problem, joint pain, myalgias and myalgias.  Skin:  Negative for color change, rash and sensitivity to sunlight.   Allergic/Immunologic: Negative for susceptible to infections.  Neurological:  Negative for headaches and memory loss.  Hematological:  Negative for swollen glands.  Psychiatric/Behavioral:  Positive for sleep disturbance. Negative for depressed mood. The patient is nervous/anxious.    PMFS History:  Patient Active Problem List   Diagnosis Date Noted   Snoring 04/13/2021   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   Epistaxis 03/29/2021   CAD in native artery 08/15/2020   Basal cell carcinoma 06/30/2020   History of loop recorder 12/13/2019   Allergic rhinitis 12/08/2019   Cough 11/08/2019   Post-nasal drip 11/08/2019   Multiple joint pain 04/22/2019   Bilateral leg edema 04/22/2019   Vaginal atrophy 01/05/2019   Resting tremor 09/08/2018   Sarcoidosis 05/08/2018   HTN (hypertension) 05/08/2018   Colon polyp 05/08/2018   Family hx of colon cancer 05/08/2018   Cystocele, midline 05/08/2018   Hyperlipidemia 05/08/2018   Vasovagal syncope 05/08/2018   Seborrheic keratosis 05/08/2018   Pseudophakia of both eyes 05/08/2018   GERD (gastroesophageal reflux disease) 05/08/2018   COPD (chronic obstructive pulmonary disease) ([place]) 05/08/2018    Past Medical History:  Diagnosis Date   Arthritis    Asthma    CAD in native artery 08/15/2020   Chickenpox    Colon polyps 2010   Epistaxis 03/29/2021   Heart murmur    History of fainting spells of unknown cause    Hyperlipidemia    Hypertension    Sarcoid 06/16/1976  Family History  Problem Relation Age of Onset   Arthritis Mother    Cancer Mother    Depression Mother    Heart attack Mother    Hyperlipidemia Mother    Hypertension Mother    Alcohol abuse Father    Heart disease Father    Hypertension Father    Hyperlipidemia Father    Heart disease Sister    Stroke Sister    Alcohol abuse Brother    Heart disease Brother    Hypertension Brother    Hyperlipidemia Brother    Multiple sclerosis Daughter    Hyperlipidemia Son     Heart disease Maternal Grandfather    Heart disease Paternal Grandfather    Hyperlipidemia Sister    Alcohol abuse Sister    Hyperlipidemia Sister    Hypertension Sister    Alcohol abuse Brother    Early death Brother    Heart attack Brother    Heart disease Brother    Hyperlipidemia Brother    Past Surgical History:  Procedure Laterality Date   ABDOMINAL HYSTERECTOMY  1987   BASAL CELL CARCINOMA EXCISION  06/30/2020   nose   LUNG BIOPSY  06/16/1976   PERIPHERAL VASCULAR INTERVENTION Right 11/29/2020   Procedure: PERIPHERAL VASCULAR INTERVENTION;  Surgeon: Wellington Hampshire, MD;  Location: [facility];  Service: Cardiovascular;  Laterality: Right;  Renal Artery   RENAL ANGIOGRAPHY N/A 11/29/2020   Procedure: RENAL ANGIOGRAPHY;  Surgeon: Wellington Hampshire, MD;  Location: [facility];  Service: Cardiovascular;  Laterality: N/A;   Social History   Social History Narrative   Not on file   Immunization History  Administered Date(s) Administered   Fluad Quad(high Dose 65+) 11/19/2018, 12/22/2020   Influenza, High Dose Seasonal PF 01/05/2016, 12/12/2016, 12/11/2017   Influenza,inj,Quad PF,6+ Mos 12/11/2017   Influenza-Unspecified 11/24/2019, 11/24/2019   PFIZER Comirnaty(Gray Top)Covid-19 Tri-Sucrose Vaccine 06/27/2020   PFIZER(Purple Top)SARS-COV-2 Vaccination 05/14/2019, 06/08/2019, 12/20/2019   Pfizer Covid-19 Vaccine Bivalent Booster 64yr & up 12/14/2020   Pneumococcal Conjugate-13 03/23/2014   Pneumococcal Polysaccharide-23 03/24/2012, 01/22/2016   Tdap 03/24/2012   Zoster Recombinat (Shingrix) 01/29/2021     Objective: Vital Signs: BP (!) 145/81 (BP Location: Left Arm, Patient Position: Sitting, Cuff Size: Small)   Pulse 84   Resp 12   Ht 5' 2.75" (1.594 m)   Wt 144 lb 3.2 oz (65.4 kg)   BMI 25.75 kg/m    Physical Exam Vitals and nursing note reviewed.  Constitutional:      Appearance: She is well-developed.  HENT:     Head: Normocephalic and  atraumatic.  Eyes:     Conjunctiva/sclera: Conjunctivae normal.  Cardiovascular:     Rate and Rhythm: Normal rate and regular rhythm.     Heart sounds: Normal heart sounds.  Pulmonary:     Effort: Pulmonary effort is normal.     Breath sounds: Normal breath sounds.  Abdominal:     General: Bowel sounds are normal.     Palpations: Abdomen is soft.  Musculoskeletal:     Cervical back: Normal range of motion.  Lymphadenopathy:     Cervical: No cervical adenopathy.  Skin:    General: Skin is warm and dry.     Capillary Refill: Capillary refill takes less than 2 seconds.  Neurological:     Mental Status: She is alert and oriented to person, place, and time.  Psychiatric:        Behavior: Behavior normal.     Musculoskeletal Exam: C-spine was  in good range of motion.  Shoulder joints, elbow joints, wrist joints, and MCPs were in good range of motion.  She had bilateral PIP and DIP thickening and incomplete fist formation due to severe osteoarthritis.  Hip joints and knee joints in good range of motion.  She had no tenderness over ankles or MTPs.  CDAI Exam: CDAI Score: -- Patient Global: --; Provider Global: -- Swollen: --; Tender: -- Joint Exam 08/02/2021   No joint exam has been documented for this visit   There is currently no information documented on the homunculus. Go to the Rheumatology activity and complete the homunculus joint exam.  Investigation: No additional findings.  Imaging: No results found.  Recent Labs: Lab Results  Component Value Date   WBC 8.2 01/03/2021   HGB 12.8 01/03/2021   PLT 227 01/03/2021   NA 139 07/31/2021   K 3.7 07/31/2021   CL 99 07/31/2021   CO2 31 07/31/2021   GLUCOSE 104 (H) 07/31/2021   BUN 29 (H) 07/31/2021   CREATININE 1.16 07/31/2021   BILITOT 0.6 01/29/2021   ALKPHOS 83 01/29/2021   AST 15 01/29/2021   ALT 17 01/29/2021   PROT 6.9 01/29/2021   ALBUMIN 4.7 07/31/2021   CALCIUM 9.7 07/31/2021   GFRAA 57 (L) 05/05/2019      Speciality Comments: No specialty comments available.  Procedures:  No procedures performed Allergies: Keflex [cephalexin], Penicillins, and Sulfa antibiotics   Assessment / Plan:     Visit Diagnoses: Sarcoidosis - patient was diagnosed with sarcoidosis at age 66 and was treated with prednisone for 15 years.She states she has been followed by Dr. Lamonte Sakai.  February 05, 2021 chest x-ray showed bilateral upper lobe volume loss with superior retraction of the hila on the left side.  Bilateral upper lobe scarring with calcified granuloma in the left upper lobe consistent with the history of sarcoidosis.  No active disease was noted.  Aortic atherosclerosis was noted.  Reported by Dr. Thornton Papas.  I reviewed Dr. Agustina Caroli note from Jul 17, 2021.  He noted findings consistent with severe COPD.  Primary osteoarthritis of both hands-she has severe PIP and DIP thickening and limited range of motion of some of the PIP and DIP joints which limits fist formation.  She is very active and does gardening.  Primary osteoarthritis of both feet-she has chronic discomfort in her feet.  Proper fitting shoes were discussed.  Pain in thoracic spine -she has been experiencing increased thoracic pain since January 2023.she was evaluated by Dr. Georgina Snell.  She had x-ray of her thoracic spine which showed mild thoracic spondylosis and scoliosis on June 27, 2021 by Dr. Owens Shark.  Patient tried physical therapy but is still has ongoing pain.  She is scheduled to have MRI of her thoracic spine on May 21.  Postural kyphosis of thoracic region-increased pain  Osteopenia of multiple sites - The BMD measured at Femur Neck Right is 0.725 g/cm2 with a T-score of -2.2.  On March 14, 2020.  Use of calcium rich diet and vitamin D was discussed.  She is currently not taking any medications for osteopenia.   Muscle spasm she complains of muscle spasms at night.  Use of magnesium malate was discussed.  Side effects from use of magnesium were  discussed.    Other medical problems are listed as follows:  HTN (hypertension), benign  History of hyperlipidemia - On rosuvastatin 40 mg p.o. daily  History of gastroesophageal reflux (GERD)  Hx of colonic polyps  Chronic bronchitis, unspecified  chronic bronchitis type [facility])  Renal artery stenosis ([place]) - s/p stent placement 11/2021.  Seborrheic keratosis  Resting tremor  Pseudophakia of both eyes  Family history of multiple sclerosis  Orders: No orders of the defined types were placed in this encounter.  No orders of the defined types were placed in this encounter.   Face-to-face time spent with patient was 30 minutes. Greater than 50% of time was spent in counseling and coordination of care.  Follow-Up Instructions: Return in about 1 year (around 08/03/2022) for Sarcoidosis, Osteoarthritis.   Bo Merino, MD  Note - This record has been created using Editor, commissioning.  Chart creation errors have been sought, but may not always  have been located. Such creation errors do not reflect on  the standard of medical care.

## 2021-07-23 ENCOUNTER — Other Ambulatory Visit: Payer: Self-pay | Admitting: Nurse Practitioner

## 2021-07-23 DIAGNOSIS — R0982 Postnasal drip: Secondary | ICD-10-CM

## 2021-07-23 DIAGNOSIS — R059 Cough, unspecified: Secondary | ICD-10-CM

## 2021-07-24 ENCOUNTER — Ambulatory Visit (INDEPENDENT_AMBULATORY_CARE_PROVIDER_SITE_OTHER): Payer: Medicare Other | Admitting: Cardiovascular Disease

## 2021-07-24 VITALS — BP 132/70 | HR 71 | Ht 63.0 in | Wt 143.2 lb

## 2021-07-24 DIAGNOSIS — E785 Hyperlipidemia, unspecified: Secondary | ICD-10-CM | POA: Diagnosis not present

## 2021-07-24 DIAGNOSIS — I701 Atherosclerosis of renal artery: Secondary | ICD-10-CM | POA: Diagnosis not present

## 2021-07-24 NOTE — Progress Notes (Signed)
Cardiology Office Note   Date:  07/24/2021   ID:  CARLINE KINZER, DOB [birth year], MRN [id number]  PCP:  Anne Ng, NP  Cardiologist: Dr. Duke Salvia  Chief Complaint  Patient presents with   Follow-up             History of Present Illness: Kristen Suarez is a 76 y.o. female who is here today for follow-up visit regarding renovascular hypertension.   She has known history of hypertension, coronary calcifications noted on previous CT scan, pulmonary sarcoidosis and asthma.  She was diagnosed with hypertension 20 years ago but had significant elevation in blood pressure over the last 6 months.      She underwent renal artery duplex in June, 2022 which showed significant right renal artery stenosis with peak velocity of 291 with a bruit.  No flow was detected into the left renal artery.  Right kidney size was 10.6 cm and left kidney was 8 cm.  Her blood pressure continues to be uncontrolled in spite of 4 antihypertensive medications and she started having worsening renal function.  Thus, I proceeded with angiography in September which showed chronically occluded left renal artery with severe ostial stenosis in the right renal artery.  I performed successful angioplasty and stent placement to the right renal artery.  She has been doing very well overall with no chest pain or shortness of breath.  Blood pressure is now well controlled on 3 medications including amlodipine, irbesartan and small dose hydrochlorothiazide 12.5 mg once daily which was resumed in March.  Renal function remained stable with most recent creatinine of 1.25.  Past Medical History:  Diagnosis Date   Arthritis    Asthma    CAD in native artery 08/15/2020   Chickenpox    Colon polyps 2010   Epistaxis 03/29/2021   Heart murmur    History of fainting spells of unknown cause    Hyperlipidemia    Hypertension    Sarcoid 06/16/1976    Past Surgical History:  Procedure Laterality Date   ABDOMINAL  HYSTERECTOMY  1987   BASAL CELL CARCINOMA EXCISION  06/30/2020   nose   LUNG BIOPSY  06/16/1976   PERIPHERAL VASCULAR INTERVENTION Right 11/29/2020   Procedure: PERIPHERAL VASCULAR INTERVENTION;  Surgeon: Iran Ouch, MD;  Location: [facility];  Service: Cardiovascular;  Laterality: Right;  Renal Artery   RENAL ANGIOGRAPHY N/A 11/29/2020   Procedure: RENAL ANGIOGRAPHY;  Surgeon: Iran Ouch, MD;  Location: [facility];  Service: Cardiovascular;  Laterality: N/A;     Current Outpatient Medications  Medication Sig Dispense Refill   acetaminophen (TYLENOL) 500 MG tablet Take 500 mg by mouth every 6 (six) hours as needed for moderate pain.     albuterol (PROVENTIL) (2.5 MG/3ML) 0.083% nebulizer solution Take 3 mLs (2.5 mg total) by nebulization every 6 (six) hours as needed for wheezing or shortness of breath. USE 1 VIAL VIA NEBULIZER EVERY 6 HOURS Strength: (2.5 MG/3ML) 0.083% 150 mL 5   albuterol (VENTOLIN HFA) 108 (90 Base) MCG/ACT inhaler Inhale 2 puffs into the lungs every 6 (six) hours as needed for wheezing or shortness of breath. 8 g 6   amLODipine (NORVASC) 10 MG tablet TAKE 1 TABLET BY MOUTH EVERY DAY 90 tablet 2   aspirin EC 81 MG tablet Take 1 tablet (81 mg total) by mouth daily. Swallow whole. 150 tablet 2   Budeson-Glycopyrrol-Formoterol (BREZTRI AEROSPHERE) 160-9-4.8 MCG/ACT AERO Inhale 2 puffs into the lungs in  the morning and at bedtime. 32.1 g 2   Calcium Carb-Cholecalciferol (CALCIUM 600 + D PO) Take 1 tablet by mouth daily.     cetirizine (ZYRTEC) 10 MG tablet Take 10 mg by mouth daily as needed for allergies.     COVID-19 mRNA bivalent vaccine, Pfizer, injection Inject into the muscle. 0.3 mL 0   docusate sodium (COLACE) 100 MG capsule Take 100 mg by mouth daily.      famotidine (PEPCID) 10 MG tablet Take 10 mg by mouth daily as needed for heartburn.     hydrochlorothiazide (MICROZIDE) 12.5 MG capsule Take 1 capsule (12.5 mg total) by mouth daily. 90  capsule 3   influenza vaccine adjuvanted (FLUAD) 0.5 ML injection Inject into the muscle. 0.5 mL 0   irbesartan (AVAPRO) 150 MG tablet Take 150 mg by mouth in the morning and at bedtime.     mometasone (NASONEX) 50 MCG/ACT nasal spray SHAKE LIQUID AND USE 2 SPRAYS IN EACH NOSTRIL DAILY 17 each 0   Multiple Vitamins-Minerals (MULTIVITAL PO) Take 1 tablet by mouth daily.      pantoprazole (PROTONIX) 40 MG tablet TAKE 1 TABLET BY MOUTH EVERY DAY 30 tablet 4   PREVIDENT 5000 BOOSTER PLUS 1.1 % PSTE Place 1 application onto teeth 3 (three) times a week.     Probiotic Product (PROBIOTIC DAILY PO) Take 1 tablet by mouth daily.      rosuvastatin (CRESTOR) 40 MG tablet Take 1 tablet (40 mg total) by mouth daily. 90 tablet 3   sodium chloride (OCEAN) 0.65 % SOLN nasal spray Place 1 spray into both nostrils as needed for congestion.     No current facility-administered medications for this visit.    Allergies:   Keflex [cephalexin], Penicillins, and Sulfa antibiotics    Social History:  The patient  reports that she has never smoked. She has never used smokeless tobacco. She reports current alcohol use. She reports that she does not use drugs.   Family History:  The patient's family history includes Alcohol abuse in her brother, brother, father, and sister; Arthritis in her mother; Cancer in her mother; Depression in her mother; Early death in her brother; Heart attack in her brother and mother; Heart disease in her brother, brother, father, maternal grandfather, paternal grandfather, and sister; Hyperlipidemia in her brother, brother, father, mother, sister, sister, and son; Hypertension in her brother, father, mother, and sister; Multiple sclerosis in her daughter; Stroke in her sister.    ROS:  Please see the history of present illness.   Otherwise, review of systems are positive for none.   All other systems are reviewed and negative.    PHYSICAL EXAM: VS:  BP 132/70   Pulse 71   Ht 5\' 3"  (1.6  m)   Wt 143 lb 3.2 oz (65 kg)   SpO2 98%   BMI 25.37 kg/m  , BMI Body mass index is 25.37 kg/m. GEN: Well nourished, well developed, in no acute distress  HEENT: normal  Neck: no JVD, carotid bruits, or masses Cardiac: RRR; no murmurs, rubs, or gallops,no edema  Respiratory:  clear to auscultation bilaterally, normal work of breathing GI: soft, nontender, nondistended, + BS MS: no deformity or atrophy  Skin: warm and dry, no rash Neuro:  Strength and sensation are intact Psych: euthymic mood, full affect    EKG:  EKG is ordered today. EKG showed normal sinus rhythm with no significant ST or T wave changes.   Recent Labs: 01/03/2021: Hemoglobin 12.8; Platelets 227  01/29/2021: ALT 17 04/05/2021: BUN 17; Creatinine, Ser 1.25; Potassium 3.9; Sodium 142    Lipid Panel    Component Value Date/Time   CHOL 177 01/29/2021 0930   CHOL 209 (H) 11/21/2020 0918   TRIG 164.0 (H) 01/29/2021 0930   HDL 69.80 01/29/2021 0930   HDL 50 11/21/2020 0918   CHOLHDL 3 01/29/2021 0930   VLDL 32.8 01/29/2021 0930   LDLCALC 74 01/29/2021 0930   LDLCALC 128 (H) 11/21/2020 0918      Wt Readings from Last 3 Encounters:  07/24/21 143 lb 3.2 oz (65 kg)  07/17/21 145 lb 6.4 oz (66 kg)  06/26/21 146 lb 3.2 oz (66.3 kg)          View : No data to display.            ASSESSMENT AND PLAN:  1.  Renal artery stenosis: Status post  stenting of the right renal artery with chronically occluded left renal artery.  Blood pressure is well controlled on current medications and renal function has stabilized.  I reviewed and discussed the results of most recent renal artery duplex done in April with the patient which showed normal velocities in the right renal artery stent.  I requested a repeat study to be done in April of next year.   2.  Hyperlipidemia: Continue treatment with rosuvastatin given the presence of atherosclerosis.  She is currently on rosuvastatin 40 mg once daily with good  tolerance.  I reviewed most recent lipid profile done in November which showed an LDL of 74 which is close to target.    Disposition: Follow-up in 12 months.  Signed,  Lorine Bears, MD  07/24/2021 10:28 AM    [facility]

## 2021-07-24 NOTE — Patient Instructions (Signed)
Medication Instructions:  ?No changes ?*If you need a refill on your cardiac medications before your next appointment, please call your pharmacy* ? ? ?Lab Work: ?None ordered ?If you have labs (blood work) drawn today and your tests are completely normal, you will receive your results only by: ?MyChart Message (if you have MyChart) OR ?A paper copy in the mail ?If you have any lab test that is abnormal or we need to change your treatment, we will call you to review the results. ? ? ?Testing/Procedures: ?Your physician has requested that you have a renal artery duplex in April 2024. During this test, an ultrasound is used to evaluate blood flow to the kidneys. Take your medications as you usually do. This will take place at [address] [place], [address]. ? ?No food after 11PM the night before.  Water is OK. (Don't drink liquids if you have been instructed not to for ANOTHER test). ?Avoid foods that produce bowel gas, for 24 hours prior to exam (see below). ?No breakfast, no chewing gum, no smoking or carbonated beverages. ?Patient may take morning medications with water. ?Come in for test at least 15 minutes early to register. ? ? ? ?Follow-Up: ?At [facility], you and your health needs are our priority.  As part of our continuing mission to provide you with exceptional heart care, we have created designated Provider Care Teams.  These Care Teams include your primary Cardiologist (physician) and Advanced Practice Providers (APPs -  Physician Assistants and Nurse Practitioners) who all work together to provide you with the care you need, when you need it. ? ?We recommend signing up for the patient portal called "MyChart".  Sign up information is provided on this After Visit Summary.  MyChart is used to connect with patients for Virtual Visits (Telemedicine).  Patients are able to view lab/test results, encounter notes, upcoming appointments, etc.  Non-urgent messages can be sent to your provider as well.   ?To  learn more about what you can do with MyChart, go to [email or web address].   ? ?Your next appointment:   ?12 month(s) ? ?The format for your next appointment:   ?In Person ? ?Provider:   ?Dr. Fletcher Anon ? ?Important Information About Sugar ? ? ? ? ? ? ?

## 2021-07-31 ENCOUNTER — Encounter: Payer: Self-pay | Admitting: Internal Medicine

## 2021-07-31 ENCOUNTER — Ambulatory Visit (INDEPENDENT_AMBULATORY_CARE_PROVIDER_SITE_OTHER): Payer: Medicare Other | Admitting: Nurse Practitioner

## 2021-07-31 ENCOUNTER — Encounter: Payer: Self-pay | Admitting: Family Medicine

## 2021-07-31 ENCOUNTER — Encounter: Payer: Self-pay | Admitting: Nurse Practitioner

## 2021-07-31 VITALS — BP 132/82 | HR 72 | Temp 97.0°F | Wt 143.6 lb

## 2021-07-31 DIAGNOSIS — E782 Mixed hyperlipidemia: Secondary | ICD-10-CM | POA: Diagnosis not present

## 2021-07-31 DIAGNOSIS — R55 Syncope and collapse: Secondary | ICD-10-CM | POA: Diagnosis not present

## 2021-07-31 DIAGNOSIS — G8929 Other chronic pain: Secondary | ICD-10-CM

## 2021-07-31 DIAGNOSIS — I150 Renovascular hypertension: Secondary | ICD-10-CM

## 2021-07-31 DIAGNOSIS — I10 Essential (primary) hypertension: Secondary | ICD-10-CM

## 2021-07-31 LAB — RENAL FUNCTION PANEL
Albumin: 4.7 g/dL (ref 3.5–5.2)
BUN: 29 mg/dL — ABNORMAL HIGH (ref 6–23)
CO2: 31 mEq/L (ref 19–32)
Calcium: 9.7 mg/dL (ref 8.4–10.5)
Chloride: 99 mEq/L (ref 96–112)
Creatinine, Ser: 1.16 mg/dL (ref 0.40–1.20)
GFR: 45.96 mL/min — ABNORMAL LOW (ref >60.00)
Glucose, Bld: 104 mg/dL — ABNORMAL HIGH (ref 70–99)
Phosphorus: 3.4 mg/dL (ref 2.3–4.6)
Potassium: 3.7 mEq/L (ref 3.5–5.1)
Sodium: 139 mEq/L (ref 135–145)

## 2021-07-31 LAB — LIPID PANEL
Cholesterol: 171 mg/dL (ref 0–200)
HDL: 61.1 mg/dL (ref >39.00)
LDL Cholesterol: 90 mg/dL (ref 0–99)
NonHDL: 109.42
Total CHOL/HDL Ratio: 3
Triglycerides: 99 mg/dL (ref 0.0–149.0)
VLDL: 19.8 mg/dL (ref 0.0–40.0)

## 2021-07-31 NOTE — Assessment & Plan Note (Signed)
States loop recorder has not functioned in last 1year. ?She plans to schedule an appt with EP cardiologist. ?Reports 1syncopal episode in September 2022 (after shower walking to bedroom, predromal symptom-nausea, LOC for <1mn, unwitnessed). This occurred 2weeks after renal artery stent placement. She was evaluated by Dr. MRogue Jury(interventional cardiologist) ?Last echo 02/2021: hyperdynamic, normal EF, grade 1 diastolic dysfunction, aortic valve sclerosis without stenosis, mild anterior pericardial effusion. ? ?Advised to schedule appt with EP cardiology ?Avoid bending with head below waist. ?Change positions slowly. ?

## 2021-07-31 NOTE — Assessment & Plan Note (Deleted)
States loop recorder has not functioned in last 1year. ?She plans to schedule an appt with EP cardiologist. ?Reports 1syncopal episode in September 2022 (after shower walking to bedroom, predromal symptom-nausea, LOC for <72mn, unwitnessed). This occurred 2weeks after renal artery stent placement. She was evaluated by Dr. MRogue Jury(interventional cardiologist) ?Last echo 02/2021: hyperdynamic, normal EF, grade 1 diastolic dysfunction, aortic valve sclerosis without stenosis, mild anterior pericardial effusion. ? ?Advised to schedule appt with EP cardiology ?Avoid bending with head below waist. ?Change positions slowly. ?

## 2021-07-31 NOTE — Assessment & Plan Note (Addendum)
BP at goal with amlodipine, HCTZ, and irbesartan  ?Under the care of Dr. Oval Linsey and Dr. Earlie Server. ?BP Readings from Last 3 Encounters:  ?07/31/21 132/82  ?07/24/21 132/70  ?07/17/21 138/76  ? ?Repeat BMP: stable ?Maintain current med doses ?Maintain appt with cardiology ?

## 2021-07-31 NOTE — Assessment & Plan Note (Addendum)
Repeat lipid panel: Stable renal function and mild elevation in LDL. LDL goal is 70 or less. Will send copy to Dr. Oval Linsey to advise on medication changes (add zetia and maintain crestor dose at '40mg'$ ?) ?

## 2021-07-31 NOTE — Progress Notes (Signed)
? ?             Established Patient Visit ? ?Patient: Kristen Suarez   DOB: [birth year]   76 y.o. Female  MRN: [id number] ?Visit Date: 07/31/2021 ? ?Subjective:  ?  ?Chief Complaint  ?Patient presents with  ? Follow-up  ?  HTN and Hyperlipidemia. Patient is fasting. Patient will update her second shingles vaccine on mychart when she gets home.   ? ?HPI ? ?HTN (hypertension) ?BP at goal with amlodipine, HCTZ, and irbesartan  ?Under the care of Dr. Oval Linsey and Dr. Earlie Server. ?BP Readings from Last 3 Encounters:  ?07/31/21 132/82  ?07/24/21 132/70  ?07/17/21 138/76  ? ?Repeat BMP: stable ?Maintain current med doses ?Maintain appt with cardiology ? ?Hyperlipidemia ?Repeat lipid panel: Stable renal function and mild elevation in LDL. LDL goal is 70 or less. Will send copy to Dr. Oval Linsey to advise on medication changes (add zetia and maintain crestor dose at 7m?) ? ?Vasovagal syncope ?States loop recorder has not functioned in last 1year. ?She plans to schedule an appt with EP cardiologist. ?Reports 1syncopal episode in September 2022 (after shower walking to bedroom, predromal symptom-nausea, LOC for <129m, unwitnessed). This occurred 2weeks after renal artery stent placement. She was evaluated by Dr. MuRogue Juryinterventional cardiologist) ?Last echo 02/2021: hyperdynamic, normal EF, grade 1 diastolic dysfunction, aortic valve sclerosis without stenosis, mild anterior pericardial effusion. ? ?Advised to schedule appt with EP cardiology ?Avoid bending with head below waist. ?Change positions slowly. ? ?Reviewed medical, surgical, and social history today ? ?Medications: ?Outpatient Medications Prior to Visit  ?Medication Sig  ? acetaminophen (TYLENOL) 500 MG tablet Take 500 mg by mouth every 6 (six) hours as needed for moderate pain.  ? albuterol (PROVENTIL) (2.5 MG/3ML) 0.083% nebulizer solution Take 3 mLs (2.5 mg total) by nebulization every 6 (six) hours as needed for wheezing or shortness of breath. USE 1 VIAL VIA  NEBULIZER EVERY 6 HOURS Strength: (2.5 MG/3ML) 0.083%  ? albuterol (VENTOLIN HFA) 108 (90 Base) MCG/ACT inhaler Inhale 2 puffs into the lungs every 6 (six) hours as needed for wheezing or shortness of breath.  ? amLODipine (NORVASC) 10 MG tablet TAKE 1 TABLET BY MOUTH EVERY DAY  ? aspirin EC 81 MG tablet Take 1 tablet (81 mg total) by mouth daily. Swallow whole.  ? Budeson-Glycopyrrol-Formoterol (BREZTRI AEROSPHERE) 160-9-4.8 MCG/ACT AERO Inhale 2 puffs into the lungs in the morning and at bedtime.  ? Calcium Carb-Cholecalciferol (CALCIUM 600 + D PO) Take 1 tablet by mouth daily.  ? cetirizine (ZYRTEC) 10 MG tablet Take 10 mg by mouth daily as needed for allergies.  ? COVID-19 mRNA bivalent vaccine, Pfizer, injection Inject into the muscle.  ? docusate sodium (COLACE) 100 MG capsule Take 100 mg by mouth daily.   ? famotidine (PEPCID) 10 MG tablet Take 10 mg by mouth daily as needed for heartburn.  ? hydrochlorothiazide (MICROZIDE) 12.5 MG capsule Take 1 capsule (12.5 mg total) by mouth daily.  ? influenza vaccine adjuvanted (FLUAD) 0.5 ML injection Inject into the muscle.  ? irbesartan (AVAPRO) 150 MG tablet Take 150 mg by mouth in the morning and at bedtime.  ? mometasone (NASONEX) 50 MCG/ACT nasal spray SHAKE LIQUID AND USE 2 SPRAYS IN EACH NOSTRIL DAILY  ? Multiple Vitamins-Minerals (MULTIVITAL PO) Take 1 tablet by mouth daily.   ? pantoprazole (PROTONIX) 40 MG tablet TAKE 1 TABLET BY MOUTH EVERY DAY  ? PREVIDENT 5000 BOOSTER PLUS 1.1 % PSTE Place 1 application onto teeth 3 (three) times  a week.  ? Probiotic Product (PROBIOTIC DAILY PO) Take 1 tablet by mouth daily.   ? sodium chloride (OCEAN) 0.65 % SOLN nasal spray Place 1 spray into both nostrils as needed for congestion.  ? rosuvastatin (CRESTOR) 40 MG tablet Take 1 tablet (40 mg total) by mouth daily.  ? ?No facility-administered medications prior to visit.  ? ?Reviewed past medical and social history.  ? ?ROS per HPI above ? ?Last CBC ?Lab Results   ?Component Value Date  ? WBC 8.2 01/03/2021  ? HGB 12.8 01/03/2021  ? HCT 38.1 01/03/2021  ? MCV 92.5 01/03/2021  ? MCH 31.1 01/03/2021  ? RDW 13.4 01/03/2021  ? PLT 227 01/03/2021  ? ?Last metabolic panel ?Lab Results  ?Component Value Date  ? GLUCOSE 104 (H) 07/31/2021  ? NA 139 07/31/2021  ? K 3.7 07/31/2021  ? CL 99 07/31/2021  ? CO2 31 07/31/2021  ? BUN 29 (H) 07/31/2021  ? CREATININE 1.16 07/31/2021  ? EGFR 45 (L) 04/05/2021  ? CALCIUM 9.7 07/31/2021  ? PHOS 3.4 07/31/2021  ? PROT 6.9 01/29/2021  ? ALBUMIN 4.7 07/31/2021  ? LABGLOB 2.2 11/21/2020  ? AGRATIO 2.2 11/21/2020  ? BILITOT 0.6 01/29/2021  ? ALKPHOS 83 01/29/2021  ? AST 15 01/29/2021  ? ALT 17 01/29/2021  ? ANIONGAP 9 01/03/2021  ? ?Last thyroid functions ?Lab Results  ?Component Value Date  ? TSH 2.32 02/29/2020  ? ?  ?   ?Objective:  ?BP 132/82 (BP Location: Left Arm, Patient Position: Sitting, Cuff Size: Large)   Pulse 72   Temp (!) 97 ?F (36.1 ?C) (Temporal)   Wt 143 lb 9.6 oz (65.1 kg)   SpO2 98%   BMI 25.44 kg/m?  ? ?  ? ?Physical Exam ?Cardiovascular:  ?   Rate and Rhythm: Normal rate and regular rhythm.  ?   Pulses: Normal pulses.  ?   Heart sounds: Murmur heard.  ?Pulmonary:  ?   Effort: Pulmonary effort is normal.  ?   Breath sounds: Normal breath sounds.  ?Musculoskeletal:  ?   Right lower leg: No edema.  ?   Left lower leg: No edema.  ?Neurological:  ?   Mental Status: She is alert and oriented to person, place, and time.  ?Psychiatric:     ?   Mood and Affect: Mood normal.     ?   Behavior: Behavior normal.     ?   Thought Content: Thought content normal.  ?  ?Results for orders placed or performed in visit on 07/31/21  ?Renal Function Panel  ?Result Value Ref Range  ? Sodium 139 135 - 145 mEq/L  ? Potassium 3.7 3.5 - 5.1 mEq/L  ? Chloride 99 96 - 112 mEq/L  ? CO2 31 19 - 32 mEq/L  ? Albumin 4.7 3.5 - 5.2 g/dL  ? BUN 29 (H) 6 - 23 mg/dL  ? Creatinine, Ser 1.16 0.40 - 1.20 mg/dL  ? Glucose, Bld 104 (H) 70 - 99 mg/dL  ? Phosphorus 3.4  2.3 - 4.6 mg/dL  ? GFR 45.96 (L) >60.00 mL/min  ? Calcium 9.7 8.4 - 10.5 mg/dL  ?Lipid panel  ?Result Value Ref Range  ? Cholesterol 171 0 - 200 mg/dL  ? Triglycerides 99.0 0.0 - 149.0 mg/dL  ? HDL 61.10 >39.00 mg/dL  ? VLDL 19.8 0.0 - 40.0 mg/dL  ? LDL Cholesterol 90 0 - 99 mg/dL  ? Total CHOL/HDL Ratio 3   ? NonHDL 109.42   ? ?   ?  Assessment & Plan:  ?  ?Problem List Items Addressed This Visit   ? ?  ? Cardiovascular and Mediastinum  ? HTN (hypertension) - Primary  ?  BP at goal with amlodipine, HCTZ, and irbesartan  ?Under the care of Dr. Oval Linsey and Dr. Earlie Server. ?BP Readings from Last 3 Encounters:  ?07/31/21 132/82  ?07/24/21 132/70  ?07/17/21 138/76  ? ?Repeat BMP: stable ?Maintain current med doses ?Maintain appt with cardiology ?  ?  ? Relevant Orders  ? Renal Function Panel (Completed)  ?  ? Other  ? Hyperlipidemia  ?  Repeat lipid panel: Stable renal function and mild elevation in LDL. LDL goal is 70 or less. Will send copy to Dr. Oval Linsey to advise on medication changes (add zetia and maintain crestor dose at 23m?) ? ?  ?  ? Relevant Orders  ? Lipid panel (Completed)  ? Vasovagal syncope  ?  States loop recorder has not functioned in last 1year. ?She plans to schedule an appt with EP cardiologist. ?Reports 1syncopal episode in September 2022 (after shower walking to bedroom, predromal symptom-nausea, LOC for <126m, unwitnessed). This occurred 2weeks after renal artery stent placement. She was evaluated by Dr. MuRogue Juryinterventional cardiologist) ?Last echo 02/2021: hyperdynamic, normal EF, grade 1 diastolic dysfunction, aortic valve sclerosis without stenosis, mild anterior pericardial effusion. ? ?Advised to schedule appt with EP cardiology ?Avoid bending with head below waist. ?Change positions slowly. ? ?  ?  ? ?Return in about 1 year (around 08/01/2022), or AWV with wellness coach in November, for HTN and , hyperlipidemia (fasting). ? ?  ? ?ChWilfred LacyNP ? ? ?

## 2021-07-31 NOTE — Patient Instructions (Signed)
Go to lab ? ?Contact Dr. Jens Som about loop recorder. ? ?Maintain current medications ?

## 2021-08-02 ENCOUNTER — Encounter: Payer: Self-pay | Admitting: Rheumatology

## 2021-08-02 ENCOUNTER — Ambulatory Visit (INDEPENDENT_AMBULATORY_CARE_PROVIDER_SITE_OTHER): Payer: Medicare Other | Admitting: Rheumatology

## 2021-08-02 VITALS — BP 145/81 | HR 84 | Resp 12 | Ht 62.75 in | Wt 144.2 lb

## 2021-08-02 DIAGNOSIS — I701 Atherosclerosis of renal artery: Secondary | ICD-10-CM | POA: Diagnosis not present

## 2021-08-02 DIAGNOSIS — M19041 Primary osteoarthritis, right hand: Secondary | ICD-10-CM | POA: Diagnosis not present

## 2021-08-02 DIAGNOSIS — M8589 Other specified disorders of bone density and structure, multiple sites: Secondary | ICD-10-CM

## 2021-08-02 DIAGNOSIS — M62838 Other muscle spasm: Secondary | ICD-10-CM

## 2021-08-02 DIAGNOSIS — M19071 Primary osteoarthritis, right ankle and foot: Secondary | ICD-10-CM | POA: Diagnosis not present

## 2021-08-02 DIAGNOSIS — Z8719 Personal history of other diseases of the digestive system: Secondary | ICD-10-CM

## 2021-08-02 DIAGNOSIS — Z961 Presence of intraocular lens: Secondary | ICD-10-CM

## 2021-08-02 DIAGNOSIS — M19042 Primary osteoarthritis, left hand: Secondary | ICD-10-CM

## 2021-08-02 DIAGNOSIS — I10 Essential (primary) hypertension: Secondary | ICD-10-CM

## 2021-08-02 DIAGNOSIS — Z820 Family history of epilepsy and other diseases of the nervous system: Secondary | ICD-10-CM

## 2021-08-02 DIAGNOSIS — G252 Other specified forms of tremor: Secondary | ICD-10-CM

## 2021-08-02 DIAGNOSIS — M546 Pain in thoracic spine: Secondary | ICD-10-CM

## 2021-08-02 DIAGNOSIS — L821 Other seborrheic keratosis: Secondary | ICD-10-CM

## 2021-08-02 DIAGNOSIS — M19072 Primary osteoarthritis, left ankle and foot: Secondary | ICD-10-CM

## 2021-08-02 DIAGNOSIS — J42 Unspecified chronic bronchitis: Secondary | ICD-10-CM

## 2021-08-02 DIAGNOSIS — Z86010 Personal history of colonic polyps: Secondary | ICD-10-CM

## 2021-08-02 DIAGNOSIS — M4004 Postural kyphosis, thoracic region: Secondary | ICD-10-CM

## 2021-08-02 DIAGNOSIS — Z8639 Personal history of other endocrine, nutritional and metabolic disease: Secondary | ICD-10-CM

## 2021-08-02 DIAGNOSIS — D869 Sarcoidosis, unspecified: Secondary | ICD-10-CM | POA: Diagnosis not present

## 2021-08-03 ENCOUNTER — Other Ambulatory Visit: Payer: Self-pay | Admitting: Cardiovascular Disease

## 2021-08-03 NOTE — Telephone Encounter (Signed)
Refill request

## 2021-08-05 ENCOUNTER — Ambulatory Visit (INDEPENDENT_AMBULATORY_CARE_PROVIDER_SITE_OTHER): Payer: Medicare Other

## 2021-08-05 DIAGNOSIS — G8929 Other chronic pain: Secondary | ICD-10-CM | POA: Diagnosis not present

## 2021-08-05 DIAGNOSIS — M546 Pain in thoracic spine: Secondary | ICD-10-CM | POA: Diagnosis not present

## 2021-08-07 NOTE — Progress Notes (Signed)
Thoracic spine MRI does not show fracture or pinched nerve.  Recommend return to clinic to talk about the results of the MRI in full detail and talk about next steps.

## 2021-08-08 NOTE — Progress Notes (Unsigned)
I, [name], LAT, ATC, am serving as scribe for Dr. [name].  Kristen Suarez is a 76 y.o. female who presents to [place] at [facility] today for f/u of t-spine pain due to some degenerative changes as well as perispinal and rhomboid musculature.  She was last seen by Dr. Georgina Snell on 06/26/21 and noted con't mid-back pain. She has completed 17 PT visits. Today, pt reports she had been feeling better when she was doing PT and feels she has worsened since stopping. 4/19 pt suffered fall landing on her R side and straining her neck. Pt locates pain to the L thoracic region and bilat neck. No numbness/tingling noted.  Diagnostic testing: T-spine MRI- 08/05/21; T-spine XR- 06/26/21  Pertinent review of systems: no fever or chills  Relevant historical information: COPD   Exam:  BP 130/82   Pulse 74   Ht 5' 2.75" (1.594 m)   Wt 144 lb (65.3 kg)   SpO2 98%   BMI 25.71 kg/m  General: Well Developed, well nourished, and in no acute distress.   MSK: C-spine: Nontender midline. Palpation bilateral cervical paraspinal musculature. Decreased cervical motion. Upper extremity strength is intact throughout. Reflexes are intact.  Left thoracic area. Tender palpation trapezius especially at insertion onto the scapular border.    Lab and Radiology Results  X-ray images C-spine obtained today personally and independently interpreted Diffuse degenerative changes without acute fractures.  Spondylosis most prominent at C6-7.  No acute fractures. Await formal radiology review    EXAM: MRI THORACIC SPINE WITHOUT CONTRAST   TECHNIQUE: Multiplanar, multisequence MR imaging of the thoracic spine was performed. No intravenous contrast was administered.   COMPARISON:  June 26, 2021 radiographs.   FINDINGS: Alignment: Mildly exaggerated thoracic kyphosis. No substantial sagittal subluxation.   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 These lesions at characteristic T1 hyperintensity with thickened trabecula. No suspicious bone lesions. No specific evidence of acute fracture or discitis/osteomyelitis.   Cord:  Normal cord signal and morphology.   Paraspinal and other soft tissues: Partially imaged areas of traction bronchiectasis and architectural distortion bilaterally, better characterized on prior CT chest.   Disc levels: No significant disc protrusion, foraminal stenosis, or canal stenosis.   IMPRESSION: 1. No evidence of acute abnormality. 2. No significant stenosis. 3. 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 4. Partially imaged areas of traction bronchiectasis and architectural distortion bilaterally, better characterized on prior CT chest.     Electronically Signed   By: Margaretha Sheffield M.D.   On: 08/06/2021 11:26   I, [name], personally (independently) visualized and performed the interpretation of the images attached in this note.     Assessment and Plan: 76 y.o. female with chronic thoracic back pain left side.  Patient has failed to improve despite good trial of conservative management.  Thoracic spine MRI did not show thoracic radiculopathy or source of pain.  At this point we talked about potential next steps. Trigger point injection is a possibility but somewhat not appealing given her COPD and risk of pneumothorax. She has developed muscle relaxers which is reasonable.  We will try a series of muscle relaxers to see if he can find one that she can tolerate.  We will start with tizanidine moving to methocarbamol the cyclobenzaprine and the baclofen and then Skelaxin.  She also has new bilateral neck pain after a fall.  This is thought to be muscle dysfunction.  Watchful waiting and x-ray  today.  Recheck in a month.   PDMP not reviewed this encounter. Orders Placed This Encounter  Procedures   DG Cervical Spine 2 or 3 views     Standing Status:   Future    Number of Occurrences:   1    Standing Expiration Date:   08/10/2022    Order Specific Question:   Reason for Exam (SYMPTOM  OR DIAGNOSIS REQUIRED)    Answer:   neck pain    Order Specific Question:   Preferred imaging location?    Answer:   [facility]   Meds ordered this encounter  Medications   tiZANidine (ZANAFLEX) 2 MG tablet    Sig: Take 0.5-1 tablets (1-2 mg total) by mouth every 8 (eight) hours as needed for muscle spasms.    Dispense:  60 tablet    Refill:  1     Discussed warning signs or symptoms. Please see discharge instructions. Patient expresses understanding.   The above documentation has been reviewed and is accurate and complete [name], M.D.

## 2021-08-09 ENCOUNTER — Ambulatory Visit (INDEPENDENT_AMBULATORY_CARE_PROVIDER_SITE_OTHER): Payer: Medicare Other | Admitting: Family Medicine

## 2021-08-09 ENCOUNTER — Ambulatory Visit (INDEPENDENT_AMBULATORY_CARE_PROVIDER_SITE_OTHER): Payer: Medicare Other

## 2021-08-09 VITALS — BP 130/82 | HR 74 | Ht 62.75 in | Wt 144.0 lb

## 2021-08-09 DIAGNOSIS — M542 Cervicalgia: Secondary | ICD-10-CM

## 2021-08-09 DIAGNOSIS — M546 Pain in thoracic spine: Secondary | ICD-10-CM | POA: Diagnosis not present

## 2021-08-09 DIAGNOSIS — G8929 Other chronic pain: Secondary | ICD-10-CM

## 2021-08-09 DIAGNOSIS — I701 Atherosclerosis of renal artery: Secondary | ICD-10-CM

## 2021-08-09 MED ORDER — TIZANIDINE HCL 2 MG PO TABS: 1.0000 mg | ORAL_TABLET | Freq: Three times a day (TID) | ORAL | 1 refills | Status: DC | PRN

## 2021-08-09 NOTE — Patient Instructions (Addendum)
Thank you for coming in today.   We will try several muscle relaxers and if not better return for trigger point injection.   Please get an Xray today before you leave   Check back in 1 month

## 2021-08-10 ENCOUNTER — Encounter: Payer: Self-pay | Admitting: Nurse Practitioner

## 2021-08-14 NOTE — Progress Notes (Signed)
Cervical spine x-ray shows arthritis.  If not improved will look at this more closely with an MRI.

## 2021-08-25 ENCOUNTER — Other Ambulatory Visit: Payer: Self-pay | Admitting: Emergency Medicine

## 2021-08-25 ENCOUNTER — Other Ambulatory Visit: Payer: Self-pay | Admitting: Cardiovascular Disease

## 2021-08-25 ENCOUNTER — Other Ambulatory Visit: Payer: Self-pay | Admitting: Nurse Practitioner

## 2021-08-25 DIAGNOSIS — R059 Cough, unspecified: Secondary | ICD-10-CM

## 2021-08-25 DIAGNOSIS — R0982 Postnasal drip: Secondary | ICD-10-CM

## 2021-08-27 NOTE — Telephone Encounter (Signed)
Refill request

## 2021-08-27 NOTE — Telephone Encounter (Signed)
Chart Supports Rx Last OV: 07/2021 Next OV: not scheduled

## 2021-08-29 NOTE — Telephone Encounter (Signed)
Patient seen 05/2021 by Overton Mam NP

## 2021-09-13 ENCOUNTER — Ambulatory Visit (INDEPENDENT_AMBULATORY_CARE_PROVIDER_SITE_OTHER): Payer: Medicare Other | Admitting: Family Medicine

## 2021-09-13 ENCOUNTER — Encounter: Payer: Self-pay | Admitting: Family Medicine

## 2021-09-13 VITALS — BP 120/68 | HR 85 | Ht 62.75 in | Wt 143.8 lb

## 2021-09-13 DIAGNOSIS — I701 Atherosclerosis of renal artery: Secondary | ICD-10-CM

## 2021-09-13 DIAGNOSIS — G8929 Other chronic pain: Secondary | ICD-10-CM | POA: Diagnosis not present

## 2021-09-13 DIAGNOSIS — M546 Pain in thoracic spine: Secondary | ICD-10-CM | POA: Diagnosis not present

## 2021-09-13 MED ORDER — METHOCARBAMOL 500 MG PO TABS: 500.0000 mg | ORAL_TABLET | Freq: Three times a day (TID) | ORAL | 1 refills | Status: DC | PRN

## 2021-09-13 NOTE — Progress Notes (Signed)
   I, Kristen Suarez, LAT, ATC, am serving as scribe for Dr. [name].  Analyse Kristen Suarez is a 76 y.o. female who presents to [place] at [facility] today for  f/u of t-spine pain due to some degenerative changes as well as perispinal and rhomboid musculature dysfunction and neck pain.  She was last seen by Dr. Georgina Suarez on 08/09/21 and noted recent increased pain after suffering a fall on 07/04/21.  She was prescribed Tizanidine.  Today, pt reports some slight improvement but con't to note nagging pain in her L scapula, neck and R shoulder.  She is no longer taking the Tizanidine due to have issues w/ dizziness.  Diagnostic testing: C-spine XR- 08/09/21; T-spine MRI- 08/05/21; T-spine XR- 06/26/21  Pertinent review of systems: No fevers or chills  Relevant historical information: COPD, sarcoidosis.   Exam:  BP 120/68 (BP Location: Right Arm, Patient Position: Sitting, Cuff Size: Normal)   Pulse 85   Ht 5' 2.75" (1.594 m)   Wt 143 lb 12.8 oz (65.2 kg)   SpO2 94%   BMI 25.68 kg/m  General: Well Developed, well nourished, and in no acute distress.   MSK: T-spine: Nontender midline.  Tender palpation inferior medial border of the scapula.     Assessment and Plan: 76 y.o. female with chronic thoracic back pain.  Pain is located around the inferior medial border of the scapula.  This has been extremely challenging to resolve.  She is done a great job already with conservative management including physical therapy and home exercise program.  She was unable to tolerate tizanidine.  We will try methocarbamol and ultimately consider Skelaxin as muscle relaxers.  If she cannot tolerate these medications or they are not effective we will proceed to trigger point injection.  We have been attempting to avoid trigger point injection given the risk of pneumothorax but if nothing else works that would be reasonable. Recheck in 2 months.  PDMP not reviewed this encounter. No orders of the  defined types were placed in this encounter.  Meds ordered this encounter  Medications   methocarbamol (ROBAXIN) 500 MG tablet    Sig: Take 1 tablet (500 mg total) by mouth every 8 (eight) hours as needed for muscle spasms.    Dispense:  90 tablet    Refill:  1     Discussed warning signs or symptoms. Please see discharge instructions. Patient expresses understanding.   The above documentation has been reviewed and is accurate and complete [name], M.D.

## 2021-09-13 NOTE — Patient Instructions (Addendum)
Good to see you today.  Try methocarbamol.   If not better we can try a different muscle relaxer and if that does not work we can do a trigger point injection.   Follow-up: 2 months

## 2021-09-24 NOTE — Progress Notes (Signed)
Advanced Hypertension Clinic Follow-up:    Date:  09/25/2021   ID:  Kristen Suarez, DOB [birth year], MRN [id number]  PCP:  Flossie Buffy, NP  Cardiologist:  Skeet Latch, MD  Nephrologist:  Referring MD: Flossie Buffy, NP   CC: Hypertension  History of Present Illness:    Kristen Suarez is a 76 y.o. female with a hx of coronary artery disease, hypertension, hyperlipidemia, renal artery stenosis s/p stenting, pulmonary sarcoidosis, and asthma here for follow-up. She was initially seen in the [facility]pert[facility] Clinic 08/15/2020.  She was first diagnosed with hypertension 20 years ago.  She was in pulmonary rehab and the nurses were alarmed that her BP was running so high.  Her BP increased to 200 when walking.  It remained 140-160/70-80s.  [name] had been working with Wilfred Lacy, NP on her blood pressure control.  Most recently hydralazine was added to her regimen of amlodipine and irbesartan.  Her home blood pressure readings remained elevated though her office readings seem to have improved.  She was referred to advanced hypertension clinic for further management.  Her BP hasn't changed since adding hydralazine.  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  Prior to this she was exercising regularly. She reported orthopnea which is chronic and she attributes to her underlying lung disease.  She did have COVID in 2021 and notes that she had been more short of breath since that time.    [name] underwent stenting of the right renal artery 11/29/2020. A few days later she developed symptoms of pre-syncope and hypotension. She presented to the ED 01/03/2021 following a syncopal episode during a chair yoga class. There was a prodrome of dizziness, and she eased herself off of the chair prior to losing conciousness. Afterwards she felt fatigue and nausea. She decreased amlodipine to 5 mg and Irbesartan to 150 mg. She followed up with Laurann Montana,  NP on 01/22/2021 and was feeling well. Given her recurrent syncope, she had an Echo 02/2021 which showed LVEF 60-65% with grade 1 diastolic dysfunction. She had a small pericardial effusion. She had carotid dopplers with minimal thickening bilaterally.    At her last appointment she noted average blood pressures of 120-130's in the AM, 130-140's during mid-day, and 130-140's+ in the evenings. She also reported limitations of formal exercise due to health issues and becoming short winded quickly. She complained of occasional epistaxis since her renal artery stent placement which she attributed to Plavix. She had taken her final dose of Plavix 2 days prior. She was continued on amlodipine and her irbesartan was increased to 150 mg twice daily. She followed up with Dr. Fletcher Anon 07/2021 and was doing well.  Today, she presents a BP log with average readings of 120-130's/50-60's. However she reports an anomaly on 07/21/18 with systolic BP in the 80'E. That morning she was feeling nauseous and had taken 1/2 dose of methocarbamol. She states she was feeling "a little spacey" at the time. For exercise, she goes to the [employer] 3 days a week. She uses the machines and participates in yoga or balance/flex classes. Occasionally she does have right knee weakness, but wears support hose while exercising/walking to help with this. Lately she has been very limited by her back pain, and is unable to enjoy her gardening. Her rheumatologist has recommended magnesium supplements due to muscle cramps of her hands and legs. However she has been hesitant to start this. She denies any palpitations,  chest pain, shortness of breath, or peripheral edema. No headaches, syncope, orthopnea, or PND.  Previous antihypertensives: Unsure   Past Medical History:  Diagnosis Date   Aortic valve sclerosis 09/25/2021   Arthritis    Asthma    CAD in native artery 08/15/2020   Chickenpox    Colon polyps 2010   Epistaxis 03/29/2021   Heart  murmur    History of fainting spells of unknown cause    Hyperlipidemia    Hypertension    Renal artery stenosis ([place]) 09/25/2021   Sarcoid 06/16/1976    Past Surgical History:  Procedure Laterality Date   ABDOMINAL HYSTERECTOMY  1987   BASAL CELL CARCINOMA EXCISION  06/30/2020   nose   LUNG BIOPSY  06/16/1976   PERIPHERAL VASCULAR INTERVENTION Right 11/29/2020   Procedure: PERIPHERAL VASCULAR INTERVENTION;  Surgeon: Wellington Hampshire, MD;  Location: [place] [facility];  Service: Cardiovascular;  Laterality: Right;  Renal Artery   RENAL ANGIOGRAPHY N/A 11/29/2020   Procedure: RENAL ANGIOGRAPHY;  Surgeon: Wellington Hampshire, MD;  Location: [place] [facility];  Service: Cardiovascular;  Laterality: N/A;    Current Medications: Current Meds  Medication Sig   acetaminophen (TYLENOL) 500 MG tablet Take 500 mg by mouth every 6 (six) hours as needed for moderate pain.   albuterol (PROVENTIL) (2.5 MG/3ML) 0.083% nebulizer solution Take 3 mLs (2.5 mg total) by nebulization every 6 (six) hours as needed for wheezing or shortness of breath. USE 1 VIAL VIA NEBULIZER EVERY 6 HOURS Strength: (2.5 MG/3ML) 0.083%   albuterol (VENTOLIN HFA) 108 (90 Base) MCG/ACT inhaler Inhale 2 puffs into the lungs every 6 (six) hours as needed for wheezing or shortness of breath.   amLODIPine-Valsartan-HCTZ (EXFORGE HCT) 10-160-12.5 MG TABS Take 1 tablet by mouth daily.   aspirin EC 81 MG tablet Take 1 tablet (81 mg total) by mouth daily. Swallow whole.   Budeson-Glycopyrrol-Formoterol (BREZTRI AEROSPHERE) 160-9-4.8 MCG/ACT AERO Inhale 2 puffs into the lungs in the morning and at bedtime.   Calcium Carb-Cholecalciferol (CALCIUM 600 + D PO) Take 1 tablet by mouth daily.   cetirizine (ZYRTEC) 10 MG tablet Take 10 mg by mouth daily as needed for allergies.   COVID-19 mRNA bivalent vaccine, Pfizer, injection Inject into the muscle.   docusate sodium (COLACE) 100 MG capsule Take 100 mg by mouth daily.    famotidine (PEPCID)  10 MG tablet Take 10 mg by mouth daily as needed for heartburn.   influenza vaccine adjuvanted (FLUAD) 0.5 ML injection Inject into the muscle.   mometasone (NASONEX) 50 MCG/ACT nasal spray SHAKE LIQUID AND USE 2 SPRAYS IN EACH NOSTRIL DAILY   Multiple Vitamins-Minerals (MULTIVITAL PO) Take 1 tablet by mouth daily.    pantoprazole (PROTONIX) 40 MG tablet TAKE 1 TABLET BY MOUTH EVERY DAY   Probiotic Product (PROBIOTIC DAILY PO) Take 1 tablet by mouth daily.    rosuvastatin (CRESTOR) 40 MG tablet Take 1 tablet (40 mg total) by mouth daily.   sodium chloride (OCEAN) 0.65 % SOLN nasal spray Place 1 spray into both nostrils as needed for congestion.   [DISCONTINUED] amLODipine (NORVASC) 10 MG tablet TAKE 1 TABLET BY MOUTH EVERY DAY   [DISCONTINUED] hydrochlorothiazide (MICROZIDE) 12.5 MG capsule Take 1 capsule (12.5 mg total) by mouth daily.   [DISCONTINUED] irbesartan (AVAPRO) 150 MG tablet Take 150 mg by mouth in the morning and at bedtime.     Allergies:   Keflex [cephalexin], Penicillins, and Sulfa antibiotics   Social History   Socioeconomic History  Marital status: Married    Spouse name: Not on file   Number of children: Not on file   Years of education: Not on file   Highest education level: Not on file  Occupational History   Occupation: retired  Tobacco Use   Smoking status: Never    Passive exposure: Past   Smokeless tobacco: Never  Vaping Use   Vaping Use: Never used  Substance and Sexual Activity   Alcohol use: Yes    Comment: once in a while   Drug use: Never   Sexual activity: Not Currently  Other Topics Concern   Not on file  Social History Narrative   Not on file   Social Determinants of Health   Financial Resource Strain: [place]  (02/13/2021)   Overall Financial Resource Strain (CARDIA)    Difficulty of Paying Living Expenses: Not hard at all  Food Insecurity: No Food Insecurity (08/15/2020)   Hunger Vital Sign    Worried About Running Out of Food in the  Last Year: Never true    [place] in the Last Year: Never true  Transportation Needs: No Transportation Needs (02/13/2021)   PRAPARE - Hydrologist (Medical): No    Lack of Transportation (Non-Medical): No  Physical Activity: Insufficiently Active (02/13/2021)   Exercise Vital Sign    Days of Exercise per Week: 2 days    Minutes of Exercise per Session: 20 min  Stress: No Stress Concern Present (02/13/2021)   [place]    Feeling of Stress : Not at all  Social Connections: Moderately Isolated (02/13/2021)   Social Connection and Isolation Panel [NHANES]    Frequency of Communication with Friends and Family: Twice a week    Frequency of Social Gatherings with Friends and Family: Twice a week    Attends Religious Services: Never    Marine scientist or Organizations: No    Attends Music therapist: Never    Marital Status: Married     Family History: The patient's family history includes Alcohol abuse in her brother, brother, father, and sister; Arthritis in her mother; Cancer in her mother; Depression in her mother; Early death in her brother; Heart attack in her brother and mother; Heart disease in her brother, brother, father, maternal grandfather, paternal grandfather, and sister; Hyperlipidemia in her brother, brother, father, mother, sister, sister, and son; Hypertension in her brother, father, mother, and sister; Multiple sclerosis in her daughter; Stroke in her sister.  ROS:   Please see the history of present illness.    (+) Chronic back pain (+) Muscle cramping of bilateral hands and LE  (+) Right knee weakness All other systems reviewed and are negative.  EKGs/Labs/Other Studies Reviewed:    Renal Artery Doppler  06/19/2021: Summary:  Largest Aortic Diameter: 2.0 cm     Renal:     Right: No evidence of right renal artery stenosis, s/p renal artery          stenting. RRV flow present. Normal size right kidney. Normal         right Resisitive Index. Normal cortical thickness of right         kidney.  Left:  Known left renal artery occlusion.  Mesenteric:  Normal Celiac artery and Superior Mesenteric artery findings.  Patent IVC.   Blood pressure monitor 06/06/2021: 24 Hour Blood Pressure Monitoring   Overall Average BP:  147/73 Overall Average Heart  Rate: 78 Awake Average BP: 149/79, Heart Rate 79 Asleep Average BP: 143/70, Heart Rate 74   87% SBP >140 mmHg  Bilateral Carotid Duplex 02/16/2021: Summary:  Right Carotid: The extracranial vessels were near-normal with only minimal wall thickening or plaque.   Left Carotid: The extracranial vessels were near-normal with only minimal  wall thickening or plaque.   Vertebrals:  Bilateral vertebral arteries demonstrate antegrade flow.  Subclavians: Normal flow hemodynamics were seen in bilateral subclavian arteries.   Echo 02/16/2021:  1. There is a mid LV cavitary dynamic gradient due to hyperdynamic LVF.  Left ventricular ejection fraction, by estimation, is 60 to 65%. Left  ventricular ejection fraction by 3D volume is 64 %. The left ventricle has  normal function. The left  ventricle has no regional wall motion abnormalities. Left ventricular  diastolic parameters are consistent with Grade I diastolic dysfunction  (impaired relaxation). The average left ventricular global longitudinal  strain is -16.8 %. The global  longitudinal strain is normal.   2. Right ventricular systolic function is normal. The right ventricular  size is normal. There is normal pulmonary artery systolic pressure. The  estimated right ventricular systolic pressure is 9.1 mmHg.   3. The pericardial effusion is anterior to the right ventricle.   4. The mitral valve is normal in structure. No evidence of mitral valve  regurgitation. No evidence of mitral stenosis.   5. The aortic valve is calcified. Aortic  valve regurgitation is not  visualized. Aortic valve sclerosis/calcification is present, without any  evidence of aortic stenosis. Aortic valve area, by VTI measures 1.88 cm.  Aortic valve mean gradient measures  5.3 mmHg. Aortic valve Vmax measures 1.64 m/s.   6. The inferior vena cava is normal in size with greater than 50%  respiratory variability, suggesting right atrial pressure of 3 mmHg.   Comparison(s): EF 60%, mild MAC, moderate aortic sclerosis with no  evidence of stenosis, IAS lipomatous.   12/29/2020 CT Chest new small pericardial effusion noted.   CT Chest 12/29/2020: COMPARISON:  12/28/2019.   FINDINGS: Cardiovascular: Atherosclerotic calcification of the aorta, aortic valve and coronary arteries. Heart size normal. Small pericardial effusion, new.   Mediastinum/Nodes: Noncalcified mediastinal lymph nodes measure up to 10 mm in the low right paratracheal station, unchanged. Calcified mediastinal and hilar lymph nodes. Hilar regions are otherwise difficult to evaluate without IV contrast. No axillary adenopathy. Esophagus is grossly unremarkable.   Lungs/Pleura: Emphysema. Extensive upper and midlung zone predominant central pulmonary retraction, traction bronchiectasis and architectural distortion, as before. Additional areas of peribronchovascular ground-glass and nodularity, also similar. No pleural fluid. Airway is otherwise unremarkable.   Upper Abdomen: Visualized portions of the liver, gallbladder, adrenal glands and right kidney are unremarkable. Atrophic left kidney with a subcentimeter low-attenuation lesion in the upper pole, too small to characterize. Visualized portions of the spleen, pancreas, stomach and bowel are grossly unremarkable. A stent is seen in the right renal artery. Abdominal retroperitoneal lymph nodes measure up to 10 mm in the left periaortic station, unchanged. Calcified gastrohepatic ligament lymph node.   Musculoskeletal: There  are scattered hemangiomas in the spine. No worrisome lytic or sclerotic lesions.   IMPRESSION: 1. Mediastinal, hilar and pulmonary parenchymal findings of sarcoid, unchanged from 12/28/2019. 2. New small pericardial effusion. 3. Aortic atherosclerosis (ICD10-I70.0). Coronary artery calcification. 4.  Emphysema (ICD10-J43.9).  Renal Angiography 11/29/2020: 1.  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 2.  Successful angioplasty and stent placement to the  right renal artery.   Recommendations: Dual antiplatelet therapy for few months. Monitor blood pressure closely and adjust antihypertensive medications as needed.  EKG:  EKG is personally reviewed. 09/25/2021:  EKG was not ordered. 03/29/2021: EKG was not ordered. 08/15/2020: sinus rhythm rate 85 bpm  Recent Labs: 01/03/2021: Hemoglobin 12.8; Platelets 227 01/29/2021: ALT 17 07/31/2021: BUN 29; Creatinine, Ser 1.16; Potassium 3.7; Sodium 139   Recent Lipid Panel    Component Value Date/Time   CHOL 171 07/31/2021 0905   CHOL 209 (H) 11/21/2020 0918   TRIG 99.0 07/31/2021 0905   HDL 61.10 07/31/2021 0905   HDL 50 11/21/2020 0918   CHOLHDL 3 07/31/2021 0905   VLDL 19.8 07/31/2021 0905   LDLCALC 90 07/31/2021 0905   LDLCALC 128 (H) 11/21/2020 0918    Physical Exam:    VS:  BP 132/78 (BP Location: Right Arm, Patient Position: Sitting, Cuff Size: Normal)   Pulse 72   Ht 5' 2.75" (1.594 m)   Wt 144 lb (65.3 kg)   SpO2 99%   BMI 25.71 kg/m  , BMI Body mass index is 25.71 kg/m. GENERAL:  Well appearing HEENT: Pupils equal round and reactive, fundi not visualized, oral mucosa unremarkable NECK:  No jugular venous distention, waveform within normal limits, carotid upstroke brisk and symmetric, no bruits LUNGS:  CTAB.  No crackles, rhonchi or wheezes.  HEART:  RRR.  PMI not displaced or sustained,S1 and S2 within normal limits, no S3, no S4, no clicks, no rubs, II/VI systolic  murmur at the LUSB. ABD:  Flat, positive bowel sounds normal in frequency in pitch, no bruits, no rebound, no guarding, no midline pulsatile mass, no hepatomegaly, no splenomegaly EXT:  2 plus pulses throughout, no edema, no cyanosis no clubbing SKIN:  No rashes no nodules NEURO:  Cranial nerves II through XII grossly intact, motor grossly intact throughout PSYCH:  Cognitively intact, oriented to person place and time   ASSESSMENT/PLAN:    HTN (hypertension) Blood pressures are very well-controlled on her current regimen.  We will transition her to amlodipine/valsartan/HCTZ (10/160/12.5) to limit pill burden.  Stop amlodipine, HCTZ, and irbesartan.  She will continue to track her blood pressure and let me know if there are any concerns.  CAD in native artery Very minimal plaque on chest CT 12/2020.  She is on appropriate prior area prevention.  Continue aspirin and rosuvastatin.  Hyperlipidemia Continue rosuvastatin.   Renal artery stenosis (HCC) S/p R renal artery stenting.  Chronically occluded L renal artery.  She follows with Dr. Fletcher Anon and has annual imaging.  BP well-controlled on three agents as above.   Aortic valve sclerosis II/VI murmur on exam.  Mean gradient was 5 mmhg on echo 02/2021.  No clinical significance at this time.    Screening for Secondary Hypertension:     08/15/2020   10:31 AM  Causes  Drugs/Herbals Screened     - Comments no issues  Renovascular HTN Screened     - Comments check renal Dopplers  Sleep Apnea Screened     - Comments no triggers    Relevant Labs/Studies:    Latest Ref Rng & Units 07/31/2021    9:05 AM 04/05/2021    9:53 AM 01/29/2021    9:30 AM  Basic Labs  Sodium 135 - 145 mEq/L 139  142  138   Potassium 3.5 - 5.1 mEq/L 3.7  3.9  3.6   Creatinine 0.40 - 1.20 mg/dL 1.16  1.25  1.26  Latest Ref Rng & Units 02/29/2020    9:12 AM 04/21/2019   11:28 AM  Thyroid   TSH 0.35 - 4.50 uIU/mL 2.32  1.87                  06/25/2021    3:09 PM  Renovascular   Renal Artery [place] Completed Yes      Disposition: FU with Sophea Rackham C. Oval Linsey, MD, [facility] in 1 year.   Medication Adjustments/Labs and Tests Ordered: Current medicines are reviewed at length with the patient today.  Concerns regarding medicines are outlined above.   No orders of the defined types were placed in this encounter.  Meds ordered this encounter  Medications   amLODIPine-Valsartan-HCTZ (EXFORGE HCT) 10-160-12.5 MG TABS    Sig: Take 1 tablet by mouth daily.    Dispense:  90 tablet    Refill:  3    D/C AMLODIPINE, IRBESARTAN, AND HCTZ TO PUT ON FILE SHE WILL CALL WHEN NEEDS FILLED   I,Mathew Stumpf,acting as a scribe for Skeet Latch, MD.,have documented all relevant documentation on the behalf of Skeet Latch, MD,as directed by  Skeet Latch, MD while in the presence of Skeet Latch, MD.  I, [place] Oval Linsey, MD have reviewed all documentation for this visit.  The documentation of the exam, diagnosis, procedures, and orders on 09/25/2021 are all accurate and complete.   Signed, Skeet Latch, MD  09/25/2021 9:12 AM    [place]

## 2021-09-25 ENCOUNTER — Encounter (HOSPITAL_BASED_OUTPATIENT_CLINIC_OR_DEPARTMENT_OTHER): Payer: Self-pay | Admitting: Cardiovascular Disease

## 2021-09-25 ENCOUNTER — Ambulatory Visit (INDEPENDENT_AMBULATORY_CARE_PROVIDER_SITE_OTHER): Payer: Medicare Other | Admitting: Cardiovascular Disease

## 2021-09-25 DIAGNOSIS — I701 Atherosclerosis of renal artery: Secondary | ICD-10-CM | POA: Diagnosis not present

## 2021-09-25 DIAGNOSIS — I358 Other nonrheumatic aortic valve disorders: Secondary | ICD-10-CM | POA: Insufficient documentation

## 2021-09-25 HISTORY — DX: Other nonrheumatic aortic valve disorders: I35.8

## 2021-09-25 HISTORY — DX: Atherosclerosis of renal artery: I70.1

## 2021-09-25 MED ORDER — AMLODIPINE-VALSARTAN-HCTZ 10-160-12.5 MG PO TABS: 1.0000 | ORAL_TABLET | Freq: Every day | ORAL | 3 refills | Status: DC

## 2021-09-25 NOTE — Assessment & Plan Note (Addendum)
II/VI murmur on exam.  Mean gradient was 5 mmhg on echo 02/2021.  No clinical significance at this time.

## 2021-09-25 NOTE — Assessment & Plan Note (Signed)
Blood pressures are very well-controlled on her current regimen.  We will transition her to amlodipine/valsartan/HCTZ (10/160/12.5) to limit pill burden.  Stop amlodipine, HCTZ, and irbesartan.  She will continue to track her blood pressure and let me know if there are any concerns.

## 2021-09-25 NOTE — Patient Instructions (Signed)
Medication Instructions:   WHEN YOU FINISH YOUR IRBESARTAN, HYDROCHLOROTHIAZIDE AND AMLODIPINE START AMLODIPINE-VALSARTAN-HCTZ 10-160-12.5 MG DAILY   *If you need a refill on your cardiac medications before your next appointment, please call your pharmacy*  Lab Work: NONE  Testing/Procedures: NONE  Follow-Up: At [facility], you and your health needs are our priority.  As part of our continuing mission to provide you with exceptional heart care, we have created designated Provider Care Teams.  These Care Teams include your primary Cardiologist (physician) and Advanced Practice Providers (APPs -  Physician Assistants and Nurse Practitioners) who all work together to provide you with the care you need, when you need it.  We recommend signing up for the patient portal called "MyChart".  Sign up information is provided on this After Visit Summary.  MyChart is used to connect with patients for Virtual Visits (Telemedicine).  Patients are able to view lab/test results, encounter notes, upcoming appointments, etc.  Non-urgent messages can be sent to your provider as well.   To learn more about what you can do with MyChart, go to [email or web address].    Your next appointment:   12 month(s)  The format for your next appointment:   In Person  Provider:   Skeet Latch, MD or Laurann Montana, NP   Other Instructions CONTINUE TO MONITOR BLOOD PRESSURE AT HOME, CALL THE OFFICE IF STARTS RUNNING HIGH

## 2021-09-25 NOTE — Assessment & Plan Note (Signed)
Very minimal plaque on chest CT 12/2020.  She is on appropriate prior area prevention.  Continue aspirin and rosuvastatin.

## 2021-09-25 NOTE — Assessment & Plan Note (Signed)
S/p R renal artery stenting.  Chronically occluded L renal artery.  She follows with Dr. Fletcher Anon and has annual imaging.  BP well-controlled on three agents as above.

## 2021-09-25 NOTE — Assessment & Plan Note (Signed)
Continue rosuvastatin.  

## 2021-10-16 ENCOUNTER — Encounter: Payer: Self-pay | Admitting: Family Medicine

## 2021-10-17 MED ORDER — METAXALONE 400 MG PO TABS
400.0000 mg | ORAL_TABLET | Freq: Three times a day (TID) | ORAL | 1 refills | Status: DC
Start: 2021-10-17 — End: 2021-10-29

## 2021-10-29 ENCOUNTER — Encounter: Payer: Self-pay | Admitting: Internal Medicine

## 2021-10-29 ENCOUNTER — Ambulatory Visit (INDEPENDENT_AMBULATORY_CARE_PROVIDER_SITE_OTHER): Payer: Medicare Other | Admitting: Internal Medicine

## 2021-10-29 VITALS — BP 132/78 | HR 80 | Ht 62.75 in | Wt 143.4 lb

## 2021-10-29 DIAGNOSIS — Z9889 Other specified postprocedural states: Secondary | ICD-10-CM | POA: Diagnosis not present

## 2021-10-29 DIAGNOSIS — R0609 Other forms of dyspnea: Secondary | ICD-10-CM

## 2021-10-29 DIAGNOSIS — I701 Atherosclerosis of renal artery: Secondary | ICD-10-CM

## 2021-10-29 DIAGNOSIS — I2584 Coronary atherosclerosis due to calcified coronary lesion: Secondary | ICD-10-CM | POA: Diagnosis not present

## 2021-10-29 DIAGNOSIS — I2510 Atherosclerotic heart disease of native coronary artery without angina pectoris: Secondary | ICD-10-CM

## 2021-10-29 DIAGNOSIS — R55 Syncope and collapse: Secondary | ICD-10-CM

## 2021-10-29 MED ORDER — METOPROLOL TARTRATE 100 MG PO TABS
100.0000 mg | ORAL_TABLET | Freq: Once | ORAL | 0 refills | Status: DC
Start: 2021-10-29 — End: 2022-01-13

## 2021-10-29 MED ORDER — EMPAGLIFLOZIN 10 MG PO TABS
10.0000 mg | ORAL_TABLET | Freq: Every day | ORAL | 3 refills | Status: DC
Start: 2021-10-29 — End: 2022-07-30

## 2021-10-29 NOTE — Progress Notes (Signed)
Patient Care Team: Nche, [name], NP as PCP - General (Internal Medicine) Deboraha Sprang, MD as PCP - Electrophysiology (Cardiology) Skeet Latch, MD as PCP - Cardiology (Cardiology) Feliz Beam, MD as Referring Physician (Internal Medicine) Collene Gobble, MD as Consulting Physician (Pulmonary Disease)   HPI  Kristen Suarez is a 76 y.o. 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  Also with bendopnea.  Some lightheadedness with standing.  Some peripheral edema.  Denies chest pain.  No known known coronary disease..   She has been found to have renal artery stenosis and is being considered for stenting.  3 unilateral kidney  DATE TEST EF   10/22 CT  Coronary artery calcifications  12/22 Echo  60-65 Hyperdynamic LVEF                   Records and Results Reviewed   Past Medical History:  Diagnosis Date   Aortic valve sclerosis 09/25/2021   Arthritis    Asthma    CAD in native artery 08/15/2020   Chickenpox    Colon polyps 2010   Epistaxis 03/29/2021   Heart murmur    History of fainting spells of unknown cause    Hyperlipidemia    Hypertension    Renal artery stenosis ([place]) 09/25/2021   Sarcoid 06/16/1976    Past Surgical History:  Procedure Laterality Date   ABDOMINAL HYSTERECTOMY  1987   BASAL CELL CARCINOMA EXCISION  06/30/2020   nose   LUNG BIOPSY  06/16/1976   PERIPHERAL VASCULAR INTERVENTION Right 11/29/2020   Procedure: PERIPHERAL VASCULAR INTERVENTION;  Surgeon: Wellington Hampshire, MD;  Location: [facility];  Service: Cardiovascular;  Laterality: Right;  Renal Artery   RENAL ANGIOGRAPHY N/A 11/29/2020   Procedure: RENAL ANGIOGRAPHY;  Surgeon: Wellington Hampshire, MD;  Location: [facility];  Service: Cardiovascular;  Laterality: N/A;    Current Meds   Medication Sig   acetaminophen (TYLENOL) 500 MG tablet Take 500 mg by mouth every 6 (six) hours as needed for moderate pain.   albuterol (PROVENTIL) (2.5 MG/3ML) 0.083% nebulizer solution Take 3 mLs (2.5 mg total) by nebulization every 6 (six) hours as needed for wheezing or shortness of breath. USE 1 VIAL VIA NEBULIZER EVERY 6 HOURS Strength: (2.5 MG/3ML) 0.083%   albuterol (VENTOLIN HFA) 108 (90 Base) MCG/ACT inhaler Inhale 2 puffs into the lungs every 6 (six) hours as needed for wheezing or shortness of breath.   amLODIPine-Valsartan-HCTZ (EXFORGE HCT) 10-160-12.5 MG TABS Take 1 tablet by mouth daily.   aspirin EC 81 MG tablet Take 1 tablet (81 mg total) by mouth daily. Swallow whole.   Budeson-Glycopyrrol-Formoterol (BREZTRI AEROSPHERE) 160-9-4.8 MCG/ACT AERO Inhale 2 puffs into the lungs in the morning and at bedtime.   Calcium Carb-Cholecalciferol (CALCIUM 600 + D PO) Take 1 tablet by mouth daily.   cetirizine (ZYRTEC) 10 MG tablet Take 10 mg by mouth daily as needed for allergies.   COVID-19 mRNA bivalent vaccine, Pfizer, injection Inject into the muscle.   docusate sodium (COLACE) 100 MG capsule Take 100 mg by mouth daily.    famotidine (PEPCID) 10 MG tablet Take 10 mg by mouth daily as needed for heartburn.   influenza vaccine adjuvanted (FLUAD) 0.5 ML injection Inject into the muscle.   mometasone (NASONEX) 50  MCG/ACT nasal spray SHAKE LIQUID AND USE 2 SPRAYS IN EACH NOSTRIL DAILY   Multiple Vitamins-Minerals (MULTIVITAL PO) Take 1 tablet by mouth daily.    pantoprazole (PROTONIX) 40 MG tablet TAKE 1 TABLET BY MOUTH EVERY DAY   Probiotic Product (PROBIOTIC DAILY PO) Take 1 tablet by mouth daily.    sodium chloride (OCEAN) 0.65 % SOLN nasal spray Place 1 spray into both nostrils as needed for congestion.    Allergies  Allergen Reactions   Keflex [Cephalexin] Rash   Penicillins Itching   Sulfa Antibiotics Itching      Review of Systems negative except from HPI and  PMH  Physical Exam BP 132/78   Pulse 80   Ht 5' 2.75" (1.594 m)   Wt 143 lb 6.4 oz (65 kg)   SpO2 97%   BMI 25.60 kg/m  Well developed and well nourished in no acute distress HENT normal Neck supple with JVP 7-8 Clear Regular rate and rhythm, no  gallop No  murmur Abd-soft with active BS No Clubbing cyanosis trace edema Skin-warm and dry A & Oriented  Grossly normal sensory and motor function  ECG sinus at 80 Abrol 16/07/37 Nonspecific ST changes Assessment and  Plan  Syncope-recurrent probably neurally mediated   Implantable loop recorder   Hypertension   Sarcoid-pulmonary by biopsy presumed quiescient   Chest Pain atypical   Dyspnea on exertion/Asthma  HFpEF  No interval syncope.  Blood pressure is much improved and we will continue her on her complex medical regimen with amlodipine valsartan hydrochlorothiazide.  I wonder if she would benefit from a little bit more hydrochlorothiazide.  Her dyspnea on exertion in the absence of a pulmonary explanation is likely HFpEF particular based on the most recent echo.  I have reached out to Dr. Oval Linsey about an SGLT2 as she is the patient's primary.  Moreover, would undertake CTA given the dyspnea on exertion and a known coronary atherosclerosis from her Chest CT scan.  She is in agreement and will begin [name] 10  Maybe pulm sarcoid and will reach out to Dr. Lamonte Sakai

## 2021-10-29 NOTE — Patient Instructions (Signed)

## 2021-11-05 ENCOUNTER — Telehealth: Payer: Self-pay | Admitting: Obstetrics and Gynecology

## 2021-11-05 NOTE — Telephone Encounter (Signed)
Call A Nurse called to leave a message, state patient said she has been having Vaginal Pain.

## 2021-11-06 ENCOUNTER — Encounter: Payer: Self-pay | Admitting: Internal Medicine

## 2021-11-09 ENCOUNTER — Other Ambulatory Visit: Payer: Self-pay | Admitting: Nurse Practitioner

## 2021-11-09 DIAGNOSIS — R059 Cough, unspecified: Secondary | ICD-10-CM

## 2021-11-09 DIAGNOSIS — R0982 Postnasal drip: Secondary | ICD-10-CM

## 2021-11-09 NOTE — Telephone Encounter (Signed)
Chart supports Rx Last OV: 07/2021 Next OV: not scheduled   

## 2021-11-10 ENCOUNTER — Encounter (HOSPITAL_BASED_OUTPATIENT_CLINIC_OR_DEPARTMENT_OTHER): Payer: Self-pay | Admitting: Cardiovascular Disease

## 2021-11-12 MED ORDER — AMLODIPINE-VALSARTAN-HCTZ 10-160-12.5 MG PO TABS: 1.0000 | ORAL_TABLET | Freq: Every day | ORAL | 3 refills | Status: DC

## 2021-11-13 ENCOUNTER — Ambulatory Visit (INDEPENDENT_AMBULATORY_CARE_PROVIDER_SITE_OTHER): Payer: Medicare Other | Admitting: Family Medicine

## 2021-11-13 VITALS — BP 122/72 | HR 70 | Ht 62.75 in | Wt 144.4 lb

## 2021-11-13 DIAGNOSIS — I701 Atherosclerosis of renal artery: Secondary | ICD-10-CM | POA: Diagnosis not present

## 2021-11-13 DIAGNOSIS — G8929 Other chronic pain: Secondary | ICD-10-CM | POA: Diagnosis not present

## 2021-11-13 DIAGNOSIS — M546 Pain in thoracic spine: Secondary | ICD-10-CM | POA: Diagnosis not present

## 2021-11-13 MED ORDER — BACLOFEN 10 MG PO TABS: 10.0000 mg | ORAL_TABLET | Freq: Three times a day (TID) | ORAL | 3 refills | Status: DC | PRN

## 2021-11-13 NOTE — Progress Notes (Signed)
   I, [name], LAT, ATC acting as a scribe for [name], MD.  Kristen Suarez is a 76 y.o. female who presents to [place] at [facility] today for f/u chronic thoracic back pain. Pt was last seen by Dr. Georgina Snell on 09/13/21 and was switched to methocarbamol. Pt sent a MyChart message on 10/16/21 reports methocarbamol too sedating and she was prescribed Skelaxin and was advised she could take Meclizine for the dizziness. Today, pt reports thoracic back feels about the same. Pt reports some relief initially from the Skelaxin, but not long lasting. Additionally the Skelaxin caused nausea and GI upset and dizziness.  Dx imaging: 08/09/21 C-spine XR  08/05/21 T-spine MRI  06/26/21 T-spine XR  Pertinent review of systems: No fevers or chills  Relevant historical information: COPD   Exam:  BP 122/72   Pulse 70   Ht 5' 2.75" (1.594 m)   Wt 144 lb 6.4 oz (65.5 kg)   SpO2 97%   BMI 25.78 kg/m  General: Well Developed, well nourished, and in no acute distress.   MSK: T-spine: Tender cord of muscle left thoracic back inferior rhomboid region.       Assessment and Plan: 76 y.o. female with chronic thoracic back pain.  Unfortunately this is very resistant to typical conservative management strategies including trial of physical therapy and multiple different muscle relaxers.  The obvious neck step would be trigger point injection or dry needling.  We have been very reluctant to consider this due to the concern for pneumothorax especially with her COPD.  We will try 1 last muscle relaxer (baclofen) and if not better will return to clinic for trial of a trigger point injection into the tender band of tissue at the left thoracic inferior rhomboid region which I think is the source of her pain. Talked about the risk of trigger point injection on the thoracic spine especially pneumothorax.  PDMP not reviewed this encounter. No orders of the defined types were placed in this  encounter.  Meds ordered this encounter  Medications   baclofen (LIORESAL) 10 MG tablet    Sig: Take 1 tablet (10 mg total) by mouth 3 (three) times daily as needed for muscle spasms.    Dispense:  90 tablet    Refill:  3     Discussed warning signs or symptoms. Please see discharge instructions. Patient expresses understanding.   The above documentation has been reviewed and is accurate and complete [name], M.D.

## 2021-11-13 NOTE — Patient Instructions (Addendum)
Thank you for coming in today.   Try baclofen muscle relaxer.   If not better we can try a trigger point injection.   The Pain Management Workbook: Powerful CBT and Mindfulness Skills to Take Control of Pain and Reclaim Your Life by Charlaine Dalton MS PhD

## 2021-11-15 ENCOUNTER — Emergency Department (HOSPITAL_BASED_OUTPATIENT_CLINIC_OR_DEPARTMENT_OTHER): Payer: Medicare Other

## 2021-11-15 ENCOUNTER — Encounter: Payer: Self-pay | Admitting: Family Medicine

## 2021-11-15 ENCOUNTER — Emergency Department (HOSPITAL_COMMUNITY): Payer: Medicare Other

## 2021-11-15 ENCOUNTER — Encounter (HOSPITAL_BASED_OUTPATIENT_CLINIC_OR_DEPARTMENT_OTHER): Payer: Self-pay

## 2021-11-15 ENCOUNTER — Emergency Department (HOSPITAL_BASED_OUTPATIENT_CLINIC_OR_DEPARTMENT_OTHER)
Admission: EM | Admit: 2021-11-15 | Discharge: 2021-11-15 | Disposition: A | Discharge status: Home / Self Care | Payer: Medicare Other | Attending: Emergency Medicine | Admitting: Emergency Medicine

## 2021-11-15 DIAGNOSIS — R42 Dizziness and giddiness: Secondary | ICD-10-CM | POA: Diagnosis not present

## 2021-11-15 DIAGNOSIS — Z7982 Long term (current) use of aspirin: Secondary | ICD-10-CM | POA: Insufficient documentation

## 2021-11-15 DIAGNOSIS — T481X5A Adverse effect of skeletal muscle relaxants [neuromuscular blocking agents], initial encounter: Secondary | ICD-10-CM | POA: Insufficient documentation

## 2021-11-15 DIAGNOSIS — R531 Weakness: Secondary | ICD-10-CM | POA: Insufficient documentation

## 2021-11-15 DIAGNOSIS — Z79899 Other long term (current) drug therapy: Secondary | ICD-10-CM | POA: Diagnosis not present

## 2021-11-15 DIAGNOSIS — T50905A Adverse effect of unspecified drugs, medicaments and biological substances, initial encounter: Secondary | ICD-10-CM

## 2021-11-15 LAB — DIFFERENTIAL
Abs Immature Granulocytes: 0.02 10*3/uL (ref 0.00–0.07)
Basophils Absolute: 0.1 10*3/uL (ref 0.0–0.1)
Basophils Relative: 1 %
Eosinophils Absolute: 0.3 10*3/uL (ref 0.0–0.5)
Eosinophils Relative: 6 %
Immature Granulocytes: 0 %
Lymphocytes Relative: 24 %
Lymphs Abs: 1.4 10*3/uL (ref 0.7–4.0)
Monocytes Absolute: 0.5 10*3/uL (ref 0.1–1.0)
Monocytes Relative: 8 %
Neutro Abs: 3.6 10*3/uL (ref 1.7–7.7)
Neutrophils Relative %: 61 %

## 2021-11-15 LAB — COMPREHENSIVE METABOLIC PANEL
ALT: 20 U/L (ref 0–44)
AST: 24 U/L (ref 15–41)
Albumin: 4.2 g/dL (ref 3.5–5.0)
Alkaline Phosphatase: 76 U/L (ref 38–126)
Anion gap: 8 (ref 5–15)
BUN: 22 mg/dL (ref 8–23)
CO2: 28 mmol/L (ref 22–32)
Calcium: 9.1 mg/dL (ref 8.9–10.3)
Chloride: 99 mmol/L (ref 98–111)
Creatinine, Ser: 1.24 mg/dL — ABNORMAL HIGH (ref 0.44–1.00)
GFR, Estimated: 45 mL/min — ABNORMAL LOW (ref >60)
Glucose, Bld: 100 mg/dL — ABNORMAL HIGH (ref 70–99)
Potassium: 3.4 mmol/L — ABNORMAL LOW (ref 3.5–5.1)
Sodium: 135 mmol/L (ref 135–145)
Total Bilirubin: 0.6 mg/dL (ref 0.3–1.2)
Total Protein: 7.4 g/dL (ref 6.5–8.1)

## 2021-11-15 LAB — CBC
HCT: 42.5 % (ref 36.0–46.0)
Hemoglobin: 14.3 g/dL (ref 12.0–15.0)
MCH: 30.7 pg (ref 26.0–34.0)
MCHC: 33.6 g/dL (ref 30.0–36.0)
MCV: 91.2 fL (ref 80.0–100.0)
Platelets: 277 10*3/uL (ref 150–400)
RBC: 4.66 MIL/uL (ref 3.87–5.11)
RDW: 12.8 % (ref 11.5–15.5)
WBC: 5.9 10*3/uL (ref 4.0–10.5)
nRBC: 0 % (ref 0.0–0.2)

## 2021-11-15 LAB — CBG MONITORING, ED: Glucose-Capillary: 98 mg/dL (ref 70–99)

## 2021-11-15 LAB — PROTIME-INR
INR: 1 (ref 0.8–1.2)
Prothrombin Time: 12.7 seconds (ref 11.4–15.2)

## 2021-11-15 LAB — APTT: aPTT: 25 seconds (ref 24–36)

## 2021-11-15 LAB — ETHANOL: Alcohol, Ethyl (B): <10 mg/dL (ref <10)

## 2021-11-15 MED ORDER — LORAZEPAM 2 MG/ML IJ SOLN
0.5000 mg | Freq: Once | INTRAMUSCULAR | Status: AC | PRN
  Administered 2021-11-15: 0.5 mg via INTRAVENOUS
  Filled 2021-11-15: qty 1

## 2021-11-15 MED ORDER — LORAZEPAM 2 MG/ML IJ SOLN
INTRAMUSCULAR | Status: AC
  Filled 2021-11-15: qty 1

## 2021-11-15 NOTE — ED Provider Notes (Signed)
5:56 PM Care of the patient assumed at signout.  On signout the patient was awaiting MRI result.  These results have been reviewed, discussed with patient at bedside, with her husband as well.  Reviewed her labs which are essentially unremarkable, patient has no focal neurologic deficits, is comfortable with outpatient follow-up, will speak with her physician who recently prescribed her new baclofen regimen, for adjustments as needed.   Carmin Muskrat, MD 11/15/21 [phone]

## 2021-11-15 NOTE — ED Provider Notes (Signed)
[facility] EMERGENCY DEPARTMENT Provider Note   CSN: [id number] Arrival date & time: 11/15/21  4098     History  Chief Complaint  Patient presents with   Weakness    [name] is a 76 y.o. female.  HPI Patient has had left scapular thoracic back pain since January.  She reports there is no specific diagnosis but she has been treated by sports medicine with muscle relaxers.  She reports nothing was working but she was started on baclofen yesterday.  She took 3 doses of baclofen yesterday and reports that at bedtime, she did feel generally weak and not so well.  She went to bed about 9 PM.  Patient reports she then awoke at 7 AM.  She reports she did not get out of bed for about an hour.  She reports that she just felt more generally weak and dizzy than she had been.  She reports she did get out of bed to go to the bathroom and felt like she needed to use the walls to help support herself.  She reports that she did go down the hall and take her morning medications.  She reports she feels unusually groggy and dizzy.  She also reports that she perceived a difficulty holding things with the right hand.  She reports that she tried to hold a book while she was in bed and it felt heavy and she had difficulty holding onto it.  Patient is having difficulty specifying at what point right arm symptoms started.  She does note that she perceived some tingling in the arm but then had not tried to use the arm while she was lying in bed until about an hour later.  She reports when she walked to the bathroom she did not specifically notice either leg being weak or dragging but generally felt so weak all over that she needed support.    Home Medications Prior to Admission medications   Medication Sig Start Date End Date Taking? Authorizing Provider  acetaminophen (TYLENOL) 500 MG tablet Take 500 mg by mouth every 6 (six) hours as needed for moderate pain.    [provider]   albuterol (PROVENTIL) (2.5 MG/3ML) 0.083% nebulizer solution Take 3 mLs (2.5 mg total) by nebulization every 6 (six) hours as needed for wheezing or shortness of breath. USE 1 VIAL VIA NEBULIZER EVERY 6 HOURS Strength: (2.5 MG/3ML) 0.083% 03/07/21   Cobb, Karie Schwalbe, NP  albuterol (VENTOLIN HFA) 108 (90 Base) MCG/ACT inhaler Inhale 2 puffs into the lungs every 6 (six) hours as needed for wheezing or shortness of breath. 01/03/20   Byrum, Rose Fillers, MD  amLODIPine-Valsartan-HCTZ (EXFORGE HCT) 10-160-12.5 MG TABS Take 1 tablet by mouth daily. 11/12/21   Skeet Latch, MD  aspirin EC 81 MG tablet Take 1 tablet (81 mg total) by mouth daily. Swallow whole. 11/29/20 11/29/21  Wellington Hampshire, MD  baclofen (LIORESAL) 10 MG tablet Take 1 tablet (10 mg total) by mouth 3 (three) times daily as needed for muscle spasms. 11/13/21   Gregor Hams, MD  Budeson-Glycopyrrol-Formoterol (BREZTRI AEROSPHERE) 160-9-4.8 MCG/ACT AERO Inhale 2 puffs into the lungs in the morning and at bedtime. 04/12/21   Cobb, Karie Schwalbe, NP  Calcium Carb-Cholecalciferol (CALCIUM 600 + D PO) Take 1 tablet by mouth daily.    [provider]  cetirizine (ZYRTEC) 10 MG tablet Take 10 mg by mouth daily as needed for allergies.    [provider]  COVID-19 mRNA bivalent vaccine, Pfizer,  injection Inject into the muscle. 12/14/20   Carlyle Basques, MD  docusate sodium (COLACE) 100 MG capsule Take 100 mg by mouth daily.     [provider]  empagliflozin ([name]) 10 MG TABS tablet Take 1 tablet (10 mg total) by mouth daily before breakfast. 10/29/21   Deboraha Sprang, MD  famotidine (PEPCID) 10 MG tablet Take 10 mg by mouth daily as needed for heartburn.    [provider]  influenza vaccine adjuvanted (FLUAD) 0.5 ML injection Inject into the muscle. 12/22/20     metoprolol tartrate (LOPRESSOR) 100 MG tablet Take 1 tablet (100 mg total) by mouth once for 1 dose. Take 1-2 hours prior to CT scan. 10/29/21  10/29/21  Deboraha Sprang, MD  mometasone (NASONEX) 50 MCG/ACT nasal spray SHAKE LIQUID AND USE 2 SPRAYS IN EACH NOSTRIL DAILY 11/09/21   Nche, [name], NP  Multiple Vitamins-Minerals (MULTIVITAL PO) Take 1 tablet by mouth daily.     [provider]  pantoprazole (PROTONIX) 40 MG tablet TAKE 1 TABLET BY MOUTH EVERY DAY 08/27/21   Collene Gobble, MD  Probiotic Product (PROBIOTIC DAILY PO) Take 1 tablet by mouth daily.     [provider]  rosuvastatin (CRESTOR) 40 MG tablet Take 1 tablet (40 mg total) by mouth daily. 12/25/20 09/25/21  Skeet Latch, MD  sodium chloride (OCEAN) 0.65 % SOLN nasal spray Place 1 spray into both nostrils as needed for congestion.    [provider]      Allergies    Keflex [cephalexin], Penicillins, and Sulfa antibiotics    Review of Systems   Review of Systems 10 Systems reviewed negative except as per HPI Physical Exam Updated Vital Signs BP (!) 144/70   Pulse 62   Temp 98 F (36.7 C) (Oral)   Resp 18   SpO2 98%  Physical Exam Constitutional:      Comments: Patient is alert and nontoxic.  GCS 15.  Well-nourished well-developed good condition for age.  HENT:     Head: Normocephalic and atraumatic.     Nose: Nose normal.     Mouth/Throat:     Mouth: Mucous membranes are moist.     Pharynx: Oropharynx is clear.  Eyes:     Extraocular Movements: Extraocular movements intact.     Conjunctiva/sclera: Conjunctivae normal.     Pupils: Pupils are equal, round, and reactive to light.  Cardiovascular:     Rate and Rhythm: Normal rate and regular rhythm.  Pulmonary:     Effort: Pulmonary effort is normal.     Breath sounds: Normal breath sounds.  Abdominal:     General: There is no distension.     Palpations: Abdomen is soft.     Tenderness: There is no abdominal tenderness. There is no guarding.  Musculoskeletal:        General: No swelling or tenderness. Normal range of motion.     Cervical back: Neck supple.      Right lower leg: No edema.     Left lower leg: No edema.  Skin:    General: Skin is warm and dry.  Neurological:     Comments: Patient is alert.  She is not exhibiting confusion.  She appears mildly distressed or anxious.  At times exhaling against pursed lips with sigh.  She is following commands appropriately.  Cranial nerves II through XII intact.  Extraocular motions are conjugate.  Grip strength symmetric 5\5.  Flexion extension upper extremities symmetric against resistance.  Patient  is not exhibiting a pronator drift.  She accurately identifies peripheral vision cues with no evidence of field cut.  When doing finger-nose, patient will sometimes inaccurately touch the side of her face rather than his but accurately touches my finger.  She does require prompting to perform the finger-nose exam.  She seems slightly more accurate on the left as compared to the right.     ED Results / Procedures / Treatments   Labs (all labs ordered are listed, but only abnormal results are displayed) Labs Reviewed  COMPREHENSIVE METABOLIC PANEL - Abnormal; Notable for the following components:      Result Value   Potassium 3.4 (*)    Glucose, Bld 100 (*)    Creatinine, Ser 1.24 (*)    GFR, Estimated 45 (*)    All other components within normal limits  PROTIME-INR  APTT  CBC  DIFFERENTIAL  ETHANOL  CBG MONITORING, ED  CBG MONITORING, ED    EKG EKG Interpretation  Date/Time:  Thursday November 15 2021 09:58:46 EDT Ventricular Rate:  66 PR Interval:  186 QRS Duration: 93 QT Interval:  410 QTC Calculation: 430 R Axis:   76 Text Interpretation: Sinus rhythm no sig change from previous Confirmed by Charlesetta Shanks [phone]) on 11/15/2021 1:13:45 PM  Radiology CT HEAD WO CONTRAST  Result Date: 11/15/2021 CLINICAL DATA:  Neuro deficit with stroke suspected EXAM: CT HEAD WITHOUT CONTRAST TECHNIQUE: Contiguous axial images were obtained from the base of the skull through the vertex without intravenous  contrast. RADIATION DOSE REDUCTION: This exam was performed according to the departmental dose-optimization program which includes automated exposure control, adjustment of the mA and/or kV according to patient size and/or use of iterative reconstruction technique. COMPARISON:  None Available. FINDINGS: Brain: No evidence of acute infarction, hemorrhage, hydrocephalus, extra-axial collection or mass lesion/mass effect. Vascular: No hyperdense vessel or unexpected calcification. 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 Sinuses/Orbits: Bilateral cataract resection IMPRESSION: Negative head CT. No explanation for symptoms. Electronically Signed   By: Jorje Guild M.D.   On: 11/15/2021 10:55    Procedures Procedures    Medications Ordered in ED Medications  LORazepam (ATIVAN) injection 0.5 mg (has no administration in time range)    ED Course/ Medical Decision Making/ A&P                           Medical Decision Making Amount and/or Complexity of Data Reviewed Labs: ordered. Radiology: ordered.  Risk Prescription drug management.   Patient presents with dizziness that has a vertiginous quality.  She also had right arm symptoms that seem to be waxing and waning.  No distinct time of onset for arm symptoms.  Patient reported awakening at 7 AM and then noticing the arm symptoms at 8 AM however, she reports that she was in bed for that hour and did not use the arm but perceives may be some tingling symptoms.  For my exam, there was no significant difference left to right but nursing had observed pronator drift on their exam.  At this time I determined to proceed with stroke work-up but patient does not meet criteria for code stroke.  Also, dizziness and general weakness symptoms may be attributable to baclofen.  Patient just started baclofen yesterday and took 3 doses yesterday and 1 dose this morning.  Differential diagnosis also includes metabolic derangement\infectious  etiology.  CT scan does not show acute findings.  Lab work is without acute abnormality.  Vital signs are stable.  EKG is unchanged from previous.  Consult: Reviewed with Dr. Quinn Axe neurology.  Plan will be for MRI to rule out CVA.  At that time, if MRI negative patient does not need neurology consultation.  Patient has been alert.  She does not show confusion or overt somnolence.  She has been experiencing generalized weakness and some level of incoordination.  Seems to be more of a gradual onset since yesterday.  Baclofen may be a significant contributor to the patient's symptoms.  If diagnostic work-up is otherwise completely negative, I have high suspicion for baclofen as etiology of the patient's general weakness and vertiginous quality dizziness.  At that time patient may be ambulated and tested for mental status and neurologic function.  If all are at baseline, patient may be appropriate for discharge with discontinuing the baclofen.  To be determined based on reassessment of ED provider at [facility].        Final Clinical Impression(s) / ED Diagnoses Final diagnoses:  Dizziness  Right arm weakness  Adverse effect of drug, initial encounter    Rx / DC Orders ED Discharge Orders     None         Charlesetta Shanks, MD 11/15/21 1321

## 2021-11-15 NOTE — ED Notes (Signed)
Pt arrives to [facility] via [facility] transfer. Went to bed at 9pm lat night, felt sleepier than usual, woke up this AM at 0700 with R arm weakness and dizziness. CT negative. Symptoms resolved. NIH 0. Pt arrives a/ox4, appears in NAD.

## 2021-11-15 NOTE — ED Notes (Signed)
States during swallow screen it felt like more work to swallow and was slower than normal. Did not stop drinking however was swallowing slowly, no choking noted.

## 2021-11-15 NOTE — ED Notes (Signed)
Pt returned from MRI °

## 2021-11-15 NOTE — ED Notes (Signed)
[facility] called for transport to [facility] per EDP order

## 2021-11-15 NOTE — ED Triage Notes (Addendum)
States felt sleepy last night. Woke up at 0700 with a feeling of unsteady gait and dizziness and tingling to right arm. At 0800 started feeling weakness to right side when she had difficulty holding a book. "feels groggy". Slow speech, no slurring of words.   Went to bed at 2100 last night.   Right arm and leg weakness, ataxia to right arm. Dr Johnney Killian notified and at bedside

## 2021-11-15 NOTE — ED Notes (Signed)
[facility] on unit to transfer pt to [facility]

## 2021-11-15 NOTE — Discharge Instructions (Signed)
As discussed, your evaluation today has been largely reassuring.  But, it is important that you monitor your condition carefully, and do not hesitate to return to the ED if you develop new, or concerning changes in your condition.  Otherwise, please follow-up with your physician for appropriate ongoing care.  Please discuss your medication regimen.

## 2021-11-15 NOTE — ED Notes (Signed)
Pt transported to MRI 

## 2021-11-15 NOTE — ED Notes (Signed)
Discharge instructions reviewed with patient. Patient denies any questions or concerns. Pt wheeled out to lobby with this RN.

## 2021-11-16 ENCOUNTER — Telehealth: Payer: Self-pay | Admitting: Cardiovascular Disease

## 2021-11-16 ENCOUNTER — Telehealth (HOSPITAL_COMMUNITY): Payer: Self-pay | Admitting: Emergency Medicine

## 2021-11-16 ENCOUNTER — Telehealth: Payer: Self-pay | Admitting: Family Medicine

## 2021-11-16 ENCOUNTER — Inpatient Hospital Stay: Payer: Medicare Other | Admitting: Nurse Practitioner

## 2021-11-16 NOTE — Telephone Encounter (Signed)
Spoke with patient and she wanted Dr Blenda Mounts advised on going back on Baclofen since she had reaction Advised patient she needed to reach out to PCP or prescribing physician since this is not a cardiac medication  Patient verbalized understanding

## 2021-11-16 NOTE — Telephone Encounter (Signed)
Pt c/o medication issue:  1. Name of Medication: baclofen (LIORESAL) 10 MG tablet  2. How are you currently taking this medication (dosage and times per day)? Take 1 tablet (10 mg total) by mouth 3 (three) times daily as needed for muscle spasms.  3. Are you having a reaction (difficulty breathing--STAT)?   4. What is your medication issue? Patient would like to speak to provider prior to her restarting this medication.

## 2021-11-16 NOTE — Telephone Encounter (Signed)
I spoke with Kristen Suarez about her episode.  Plan to hold off on Baclofen for now. It certainly could have been the cause of her episode.  She notes the baclofen has made her pain go away completely so she is very eager to give it a try again in the future.  I have advised her to least hold off until her symptoms are completely resolved before she even attempts to take baclofen again.  If she were to try it again I would recommend 5 mg once a day just at bedtime only if she hurts.  This is a significant reduction in her previous dose.

## 2021-11-16 NOTE — Telephone Encounter (Signed)
Reaching out to patient to offer assistance regarding upcoming cardiac imaging study; pt verbalizes understanding of appt date/time, parking situation and where to check in, pre-test NPO status and medications ordered, and verified current allergies; name and call back number provided for further questions should they arise Kristen Bond RN Navigator Cardiac Imaging [facility] [phone] office [phone] cell  Arrival 1130 w/c entrance R arm preffered '100mg'$  metorpolol  Holding BP meds, allergy meds

## 2021-11-16 NOTE — Telephone Encounter (Signed)
November 16, 2021 Skeet Latch, MD to [facility]      11/16/21  3:35 PM Agree

## 2021-11-20 ENCOUNTER — Other Ambulatory Visit: Payer: Self-pay | Admitting: Cardiology

## 2021-11-20 ENCOUNTER — Ambulatory Visit (HOSPITAL_COMMUNITY)
Admission: RE | Admit: 2021-11-20 | Discharge: 2021-11-20 | Disposition: A | Discharge status: Home / Self Care | Payer: Medicare Other | Source: Ambulatory Visit | Attending: Internal Medicine | Admitting: Internal Medicine

## 2021-11-20 DIAGNOSIS — R931 Abnormal findings on diagnostic imaging of heart and coronary circulation: Secondary | ICD-10-CM

## 2021-11-20 DIAGNOSIS — I2510 Atherosclerotic heart disease of native coronary artery without angina pectoris: Secondary | ICD-10-CM | POA: Insufficient documentation

## 2021-11-20 DIAGNOSIS — I2584 Coronary atherosclerosis due to calcified coronary lesion: Secondary | ICD-10-CM | POA: Diagnosis present

## 2021-11-20 DIAGNOSIS — R0609 Other forms of dyspnea: Secondary | ICD-10-CM | POA: Diagnosis present

## 2021-11-20 MED ORDER — NITROGLYCERIN 0.4 MG SL SUBL
0.8000 mg | SUBLINGUAL_TABLET | Freq: Once | SUBLINGUAL | Status: AC
  Administered 2021-11-20: 0.8 mg via SUBLINGUAL

## 2021-11-20 MED ORDER — IOHEXOL 350 MG/ML SOLN
100.0000 mL | Freq: Once | INTRAVENOUS | Status: AC | PRN
  Administered 2021-11-20: 100 mL via INTRAVENOUS

## 2021-11-20 MED ORDER — NITROGLYCERIN 0.4 MG SL SUBL
SUBLINGUAL_TABLET | SUBLINGUAL | Status: AC
  Filled 2021-11-20: qty 2

## 2021-11-21 ENCOUNTER — Ambulatory Visit (HOSPITAL_COMMUNITY)
Admission: RE | Admit: 2021-11-21 | Discharge: 2021-11-21 | Disposition: A | Discharge status: Home / Self Care | Payer: Medicare Other | Source: Ambulatory Visit | Attending: Cardiology | Admitting: Cardiology

## 2021-11-21 ENCOUNTER — Ambulatory Visit (HOSPITAL_BASED_OUTPATIENT_CLINIC_OR_DEPARTMENT_OTHER)
Admission: RE | Admit: 2021-11-21 | Discharge: 2021-11-21 | Disposition: A | Discharge status: Home / Self Care | Payer: Medicare Other | Source: Ambulatory Visit | Attending: Cardiology | Admitting: Cardiology

## 2021-11-21 DIAGNOSIS — R931 Abnormal findings on diagnostic imaging of heart and coronary circulation: Secondary | ICD-10-CM | POA: Diagnosis not present

## 2021-11-23 ENCOUNTER — Encounter: Payer: Self-pay | Admitting: Nurse Practitioner

## 2021-11-30 ENCOUNTER — Other Ambulatory Visit (HOSPITAL_BASED_OUTPATIENT_CLINIC_OR_DEPARTMENT_OTHER): Payer: Self-pay

## 2021-11-30 MED ORDER — AREXVY 120 MCG/0.5ML IM SUSR
INTRAMUSCULAR | 0 refills | Status: DC
  Filled 2021-11-30: qty 0.5, 1d supply, fill #0

## 2021-11-30 MED ORDER — INFLUENZA VAC A&B SA ADJ QUAD 0.5 ML IM PRSY
PREFILLED_SYRINGE | INTRAMUSCULAR | 0 refills | Status: DC
  Filled 2021-11-30: qty 0.5, 1d supply, fill #0

## 2021-12-16 DIAGNOSIS — Q208 Other congenital malformations of cardiac chambers and connections: Secondary | ICD-10-CM

## 2021-12-16 DIAGNOSIS — Z951 Presence of aortocoronary bypass graft: Secondary | ICD-10-CM

## 2021-12-16 HISTORY — DX: Presence of aortocoronary bypass graft: Z95.1

## 2021-12-16 HISTORY — DX: Other congenital malformations of cardiac chambers and connections: Q20.8

## 2021-12-17 ENCOUNTER — Other Ambulatory Visit: Payer: Self-pay | Admitting: Nurse Practitioner

## 2021-12-17 DIAGNOSIS — R059 Cough, unspecified: Secondary | ICD-10-CM

## 2021-12-17 DIAGNOSIS — R0982 Postnasal drip: Secondary | ICD-10-CM

## 2021-12-18 NOTE — Telephone Encounter (Signed)
Chart supports Rx Last OV: 07/2021 Next OV: not scheduled   

## 2021-12-24 ENCOUNTER — Ambulatory Visit: Payer: 59 | Admitting: Obstetrics and Gynecology

## 2021-12-25 ENCOUNTER — Other Ambulatory Visit: Payer: Self-pay | Admitting: Emergency Medicine

## 2021-12-28 ENCOUNTER — Telehealth: Payer: Self-pay

## 2021-12-28 NOTE — Telephone Encounter (Signed)
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

## 2021-12-28 NOTE — Telephone Encounter (Signed)
Deboraha Sprang, MD  12/28/2021 11:08 AM EDT Back to Top    Please Inform Patient that -CTA   is abnormal and will require further eval I tried tocall as previously noted, and she called back to the work station at the hospital and I spoke with her>> abnormal CTA for two arteries and offered her option of medical therapy and stents, the first she declined as "sensitive to medicine" and I would worry with hx of VVS of using nitrates, so she has opted for the latter She will need appt with APP for preop  thx    Thanks

## 2021-12-31 ENCOUNTER — Encounter (HOSPITAL_BASED_OUTPATIENT_CLINIC_OR_DEPARTMENT_OTHER): Payer: Self-pay

## 2021-12-31 ENCOUNTER — Other Ambulatory Visit: Payer: Self-pay

## 2021-12-31 ENCOUNTER — Encounter (HOSPITAL_COMMUNITY): Payer: Self-pay

## 2021-12-31 ENCOUNTER — Inpatient Hospital Stay (HOSPITAL_BASED_OUTPATIENT_CLINIC_OR_DEPARTMENT_OTHER)
Admission: EM | Admit: 2021-12-31 | Discharge: 2022-01-13 | DRG: 234 | Disposition: A | Discharge status: Home Health | Payer: Medicare Other | Attending: Cardiothoracic Surgery | Admitting: Cardiothoracic Surgery

## 2021-12-31 ENCOUNTER — Emergency Department (HOSPITAL_BASED_OUTPATIENT_CLINIC_OR_DEPARTMENT_OTHER): Payer: Medicare Other

## 2021-12-31 DIAGNOSIS — D860 Sarcoidosis of lung: Secondary | ICD-10-CM | POA: Diagnosis present

## 2021-12-31 DIAGNOSIS — I701 Atherosclerosis of renal artery: Secondary | ICD-10-CM | POA: Diagnosis present

## 2021-12-31 DIAGNOSIS — Z7984 Long term (current) use of oral hypoglycemic drugs: Secondary | ICD-10-CM

## 2021-12-31 DIAGNOSIS — Q2112 Patent foramen ovale: Secondary | ICD-10-CM

## 2021-12-31 DIAGNOSIS — Z7982 Long term (current) use of aspirin: Secondary | ICD-10-CM

## 2021-12-31 DIAGNOSIS — I739 Peripheral vascular disease, unspecified: Secondary | ICD-10-CM | POA: Diagnosis present

## 2021-12-31 DIAGNOSIS — E860 Dehydration: Secondary | ICD-10-CM | POA: Diagnosis present

## 2021-12-31 DIAGNOSIS — E8770 Fluid overload, unspecified: Secondary | ICD-10-CM | POA: Diagnosis not present

## 2021-12-31 DIAGNOSIS — R931 Abnormal findings on diagnostic imaging of heart and coronary circulation: Secondary | ICD-10-CM

## 2021-12-31 DIAGNOSIS — M1990 Unspecified osteoarthritis, unspecified site: Secondary | ICD-10-CM | POA: Diagnosis present

## 2021-12-31 DIAGNOSIS — D62 Acute posthemorrhagic anemia: Secondary | ICD-10-CM | POA: Diagnosis not present

## 2021-12-31 DIAGNOSIS — I358 Other nonrheumatic aortic valve disorders: Secondary | ICD-10-CM | POA: Diagnosis present

## 2021-12-31 DIAGNOSIS — Z811 Family history of alcohol abuse and dependence: Secondary | ICD-10-CM

## 2021-12-31 DIAGNOSIS — K219 Gastro-esophageal reflux disease without esophagitis: Secondary | ICD-10-CM | POA: Diagnosis present

## 2021-12-31 DIAGNOSIS — Z820 Family history of epilepsy and other diseases of the nervous system: Secondary | ICD-10-CM

## 2021-12-31 DIAGNOSIS — J4489 Other specified chronic obstructive pulmonary disease: Secondary | ICD-10-CM | POA: Diagnosis present

## 2021-12-31 DIAGNOSIS — Z79899 Other long term (current) drug therapy: Secondary | ICD-10-CM

## 2021-12-31 DIAGNOSIS — Z8249 Family history of ischemic heart disease and other diseases of the circulatory system: Secondary | ICD-10-CM

## 2021-12-31 DIAGNOSIS — K5900 Constipation, unspecified: Secondary | ICD-10-CM | POA: Diagnosis not present

## 2021-12-31 DIAGNOSIS — Z809 Family history of malignant neoplasm, unspecified: Secondary | ICD-10-CM

## 2021-12-31 DIAGNOSIS — N1832 Chronic kidney disease, stage 3b: Secondary | ICD-10-CM | POA: Diagnosis present

## 2021-12-31 DIAGNOSIS — U099 Post covid-19 condition, unspecified: Secondary | ICD-10-CM | POA: Diagnosis present

## 2021-12-31 DIAGNOSIS — R55 Syncope and collapse: Secondary | ICD-10-CM | POA: Diagnosis not present

## 2021-12-31 DIAGNOSIS — Z818 Family history of other mental and behavioral disorders: Secondary | ICD-10-CM

## 2021-12-31 DIAGNOSIS — Z823 Family history of stroke: Secondary | ICD-10-CM

## 2021-12-31 DIAGNOSIS — G9339 Other post infection and related fatigue syndromes: Secondary | ICD-10-CM | POA: Diagnosis present

## 2021-12-31 DIAGNOSIS — I2510 Atherosclerotic heart disease of native coronary artery without angina pectoris: Secondary | ICD-10-CM

## 2021-12-31 DIAGNOSIS — Z882 Allergy status to sulfonamides status: Secondary | ICD-10-CM

## 2021-12-31 DIAGNOSIS — I25119 Atherosclerotic heart disease of native coronary artery with unspecified angina pectoris: Principal | ICD-10-CM | POA: Diagnosis present

## 2021-12-31 DIAGNOSIS — N179 Acute kidney failure, unspecified: Secondary | ICD-10-CM | POA: Diagnosis not present

## 2021-12-31 DIAGNOSIS — Z83438 Family history of other disorder of lipoprotein metabolism and other lipidemia: Secondary | ICD-10-CM

## 2021-12-31 DIAGNOSIS — Z8261 Family history of arthritis: Secondary | ICD-10-CM

## 2021-12-31 DIAGNOSIS — Z880 Allergy status to penicillin: Secondary | ICD-10-CM

## 2021-12-31 DIAGNOSIS — I129 Hypertensive chronic kidney disease with stage 1 through stage 4 chronic kidney disease, or unspecified chronic kidney disease: Secondary | ICD-10-CM | POA: Diagnosis present

## 2021-12-31 DIAGNOSIS — E876 Hypokalemia: Secondary | ICD-10-CM | POA: Diagnosis present

## 2021-12-31 DIAGNOSIS — Z881 Allergy status to other antibiotic agents status: Secondary | ICD-10-CM

## 2021-12-31 DIAGNOSIS — I2584 Coronary atherosclerosis due to calcified coronary lesion: Secondary | ICD-10-CM | POA: Diagnosis present

## 2021-12-31 DIAGNOSIS — Z7951 Long term (current) use of inhaled steroids: Secondary | ICD-10-CM

## 2021-12-31 DIAGNOSIS — D869 Sarcoidosis, unspecified: Secondary | ICD-10-CM | POA: Diagnosis present

## 2021-12-31 DIAGNOSIS — Z951 Presence of aortocoronary bypass graft: Secondary | ICD-10-CM

## 2021-12-31 DIAGNOSIS — E785 Hyperlipidemia, unspecified: Secondary | ICD-10-CM | POA: Diagnosis present

## 2021-12-31 DIAGNOSIS — Z85828 Personal history of other malignant neoplasm of skin: Secondary | ICD-10-CM

## 2021-12-31 DIAGNOSIS — I480 Paroxysmal atrial fibrillation: Secondary | ICD-10-CM | POA: Diagnosis not present

## 2021-12-31 DIAGNOSIS — I10 Essential (primary) hypertension: Secondary | ICD-10-CM | POA: Diagnosis present

## 2021-12-31 LAB — CBC WITH DIFFERENTIAL/PLATELET
Abs Immature Granulocytes: 0.03 10*3/uL (ref 0.00–0.07)
Basophils Absolute: 0.1 10*3/uL (ref 0.0–0.1)
Basophils Relative: 1 %
Eosinophils Absolute: 0.4 10*3/uL (ref 0.0–0.5)
Eosinophils Relative: 4 %
HCT: 43.7 % (ref 36.0–46.0)
Hemoglobin: 14.7 g/dL (ref 12.0–15.0)
Immature Granulocytes: 0 %
Lymphocytes Relative: 27 %
Lymphs Abs: 2.9 10*3/uL (ref 0.7–4.0)
MCH: 30.1 pg (ref 26.0–34.0)
MCHC: 33.6 g/dL (ref 30.0–36.0)
MCV: 89.5 fL (ref 80.0–100.0)
Monocytes Absolute: 0.9 10*3/uL (ref 0.1–1.0)
Monocytes Relative: 8 %
Neutro Abs: 6.5 10*3/uL (ref 1.7–7.7)
Neutrophils Relative %: 60 %
Platelets: 321 10*3/uL (ref 150–400)
RBC: 4.88 MIL/uL (ref 3.87–5.11)
RDW: 13.2 % (ref 11.5–15.5)
WBC: 10.9 10*3/uL — ABNORMAL HIGH (ref 4.0–10.5)
nRBC: 0 % (ref 0.0–0.2)

## 2021-12-31 LAB — COMPREHENSIVE METABOLIC PANEL
ALT: 16 U/L (ref 0–44)
AST: 21 U/L (ref 15–41)
Albumin: 4.6 g/dL (ref 3.5–5.0)
Alkaline Phosphatase: 79 U/L (ref 38–126)
Anion gap: 15 (ref 5–15)
BUN: 21 mg/dL (ref 8–23)
CO2: 26 mmol/L (ref 22–32)
Calcium: 10.1 mg/dL (ref 8.9–10.3)
Chloride: 97 mmol/L — ABNORMAL LOW (ref 98–111)
Creatinine, Ser: 1.44 mg/dL — ABNORMAL HIGH (ref 0.44–1.00)
GFR, Estimated: 38 mL/min — ABNORMAL LOW (ref >60)
Glucose, Bld: 140 mg/dL — ABNORMAL HIGH (ref 70–99)
Potassium: 3.2 mmol/L — ABNORMAL LOW (ref 3.5–5.1)
Sodium: 138 mmol/L (ref 135–145)
Total Bilirubin: 0.5 mg/dL (ref 0.3–1.2)
Total Protein: 7.6 g/dL (ref 6.5–8.1)

## 2021-12-31 LAB — URINALYSIS, ROUTINE W REFLEX MICROSCOPIC
Bilirubin Urine: NEGATIVE
Glucose, UA: >1000 mg/dL — AB
Hgb urine dipstick: NEGATIVE
Ketones, ur: NEGATIVE mg/dL
Nitrite: NEGATIVE
Protein, ur: NEGATIVE mg/dL
Specific Gravity, Urine: 1.013 (ref 1.005–1.030)
pH: 7 (ref 5.0–8.0)

## 2021-12-31 LAB — LACTIC ACID, PLASMA
Lactic Acid, Venous: 2.2 mmol/L (ref 0.5–1.9)
Lactic Acid, Venous: 0.8 mmol/L (ref 0.5–1.9)

## 2021-12-31 LAB — TROPONIN I (HIGH SENSITIVITY)
Troponin I (High Sensitivity): 4 ng/L (ref <18)
Troponin I (High Sensitivity): 3 ng/L (ref <18)

## 2021-12-31 LAB — CBG MONITORING, ED: Glucose-Capillary: 142 mg/dL — ABNORMAL HIGH (ref 70–99)

## 2021-12-31 MED ORDER — SODIUM CHLORIDE 0.9 % IV BOLUS
500.0000 mL | Freq: Once | INTRAVENOUS | Status: AC
  Administered 2021-12-31: 500 mL via INTRAVENOUS

## 2021-12-31 NOTE — ED Notes (Signed)
Pt aware of the need for urine. Unable to currently. 

## 2021-12-31 NOTE — ED Provider Notes (Signed)
[facility] Emergency Department Provider Note MRN:  [id number]  Arrival date & time: 12/31/21     Chief Complaint   Near Syncope and Emesis   History of Present Illness   Kristen Suarez is a 76 y.o. year-old female presents to the ED with history of vasovagal syncope, CAD, sarcoidosis, HTN, and renal artery stenosis chief complaint of syncope.  Occurred this morning around 10:25 AM.  Patient was sitting in a chair waiting for her yoga class.  She started to feel warm and lightheaded and for this reason moved from the chair to the floor.  Had a syncopal event.  Witnesses stated that she did hit her head.  Patient does not remember hitting her head and is unsure how long she was unconscious.  Denies chest pain, headache or abdominal pain.  Husband states that she has had a long history of syncope.  Also stated that she had a recent cardiac CT scan which showed evidence of coronary stenosis.  Per husband, she is planned to have cardiac catheter further evaluation in the near future.  History provided by patient and husband.   Review of Systems  Pertinent positive and negative review of systems noted in HPI.    Physical Exam   Vitals:   12/31/21 1730 12/31/21 1800  BP: 139/71 135/76  Pulse: 75 75  Resp: 13 15  Temp:    SpO2: 95% 95%    CONSTITUTIONAL:  ill-appearing, lethargic NEURO:  Alert and oriented x 3, CN 3-12 grossly intact.  Unable to complete finger-to-nose.  Had trouble successfully landing her finger on her nose multiple times.  3 out of 5 strength in all extremities.  Sensation intact in all extremities. Did not assess gait. EYES:  eyes equal and reactive.  Pinpoint bilaterally ENT/NECK:  Supple, no stridor  CARDIO: Regular rhythm, bradycardic appears well-perfused.  Cap refill 2 to 3 seconds. PULM:  No respiratory distress,  GI/GU:  non-distended, nontender, soft MSK/SPINE:  No gross deformities, no edema, moves all extremities  SKIN: Pale,  atraumatic   *Additional and/or pertinent findings included in MDM below  Diagnostic and Interventional Summary    EKG Interpretation  Date/Time:  Monday December 31 2021 11:05:08 EDT Ventricular Rate:  61 PR Interval:  187 QRS Duration: 93 QT Interval:  439 QTC Calculation: 443 R Axis:   69 Text Interpretation: Sinus rhythm Nonspecific T abnrm, anterolateral leads Minimal ST elevation, inferior leads when compared to prior, similar appearance. No STEMI Confirmed by Antony Blackbird [phone]) on 12/31/2021 1:24:46 PM       Labs Reviewed  CBC WITH DIFFERENTIAL/PLATELET - Abnormal; Notable for the following components:      Result Value   WBC 10.9 (*)    All other components within normal limits  COMPREHENSIVE METABOLIC PANEL - Abnormal; Notable for the following components:   Potassium 3.2 (*)    Chloride 97 (*)    Glucose, Bld 140 (*)    Creatinine, Ser 1.44 (*)    GFR, Estimated 38 (*)    All other components within normal limits  URINALYSIS, ROUTINE W REFLEX MICROSCOPIC - Abnormal; Notable for the following components:   APPearance HAZY (*)    Glucose, UA >1,000 (*)    Leukocytes,Ua LARGE (*)    Non Squamous Epithelial 0-5 (*)    All other components within normal limits  LACTIC ACID, PLASMA - Abnormal; Notable for the following components:   Lactic Acid, Venous 2.2 (*)    All other components within  normal limits  CBG MONITORING, ED - Abnormal; Notable for the following components:   Glucose-Capillary 142 (*)    All other components within normal limits  LACTIC ACID, PLASMA  CBG MONITORING, ED  TROPONIN I (HIGH SENSITIVITY)  TROPONIN I (HIGH SENSITIVITY)    DG Chest Port 1 View  Final Result    CT Head Wo Contrast  Final Result      Medications  sodium chloride 0.9 % bolus 500 mL (0 mLs Intravenous Stopped 12/31/21 1616)     Procedures  /  Critical Care Procedures  ED Course and Medical Decision Making  I have reviewed the triage vital signs, the nursing  notes, and pertinent available records from the EMR.  Social Determinants Affecting Complexity of Care: Patient .   ED Course:  Presented for syncopal event this morning. On arrival she was lethargic. chart review revealed that she has significant CAD history with two-vessel coronary stenosis noted on CT a month ago. She did not endorse chest pain and shortness of breath.  Patient received normal saline bolus.  Symptoms seem to improve.  Patient was more talkative stating she was feeling much better.  Also lactate was initially 2.2 had improved to 8.8 after bolus.  Nevertheless given her cardiac history and recent discovery of coronary stenosis consulted cardiology. Cardiology advised to admit for cardiac cath given that she had a syncopal episode today and that she has active two-vessel coronary stenosis. Admitted to hospital service.  Also considered head bleed or skull fracture given fall and positive LOC. Fortunately, Head CT was unremarkable.  Medical Decision Making Amount and/or Complexity of Data Reviewed External Data Reviewed: labs, radiology, ECG and notes.    Details: Cardiac CT 1 month ago revealed two-vessel coronary stenosis. Labs: ordered. Radiology: ordered and independent interpretation performed.    Details: CXR: Redemonstrated bilateral upper lobe scarring and volume loss with superior retraction of the left hila, consistent with the patient's history of sarcoidosis. No new focal pulmonary opacity.   CT head: negative   ECG/medicine tests: ordered and independent interpretation performed.    Details: Non-ischemic  Risk Decision regarding hospitalization.     Consultants: I discussed the case with Dr. Kate Sable, who advised to admit for likely cardiac cath. Hospital team accepted for admission.   Treatment and Plan: Patient's exam and diagnostic results are concerning for coronary artery stenosis.  Feel that patient will need admission to the hospital for  further treatment and evaluation.    Final Clinical Impressions(s) / ED Diagnoses     ICD-10-CM   1. Syncope, unspecified syncope type  R55       ED Discharge Orders     None         Discharge Instructions Discussed with and Provided to Patient:   Discharge Instructions   None       Harriet Pho, PA-C 12/31/21 1828    Tegeler, Gwenyth Allegra, MD 01/01/22 [phone]

## 2021-12-31 NOTE — ED Triage Notes (Addendum)
Was in chair yoga upstairs Moved herself to floor and "passed out" Vomited lg amount of emesis Alert, diaphoretic and lethargic  Pt moved immediately to rm 2

## 2021-12-31 NOTE — ED Notes (Signed)
Generally weak. Bilaterally weaker than normal. No current N/V/D or pain.

## 2021-12-31 NOTE — Plan of Care (Signed)
This is a 76 year old lady that past medical history of syncope, CAD, sarcoidosis, hypertension, renal artery stenosis who presented to ED with the complaint of passing out.  Reportedly, the event happened at 10:25 AM Patient was sitting in the chair waiting for her to go Back and she suddenly passed out.  According to witnesses, she hit her head but patient does not recall.  Upon arrival to the ED, patient was found to be clinically dehydrated, blood pressure was on the low normal side.  CT head was unremarkable.  Chest x-ray unremarkable.  Patient was given IV fluids.  EKG unremarkable.  In brief patient had cardiac CT done last month and was found to have coronary stenosis so ED physician discussed the case with cardiology who recommended admission under hospitalist service and that they plan to do cardiac catheter during this hospitalization and thus hospitalist service were called for admission.  Patient has been accepted to [facility].  Cardiology will need to be notified for formal consultation.  ED physician to remain responsible for all patient care until patient arrives at [facility].  Please notify patient placement once patient arrives to the medical floor so appropriate hospitalist can be paged for admission.

## 2022-01-01 ENCOUNTER — Encounter (HOSPITAL_COMMUNITY): Payer: Self-pay | Admitting: Internal Medicine

## 2022-01-01 ENCOUNTER — Observation Stay (HOSPITAL_BASED_OUTPATIENT_CLINIC_OR_DEPARTMENT_OTHER): Payer: Medicare Other

## 2022-01-01 DIAGNOSIS — E876 Hypokalemia: Secondary | ICD-10-CM | POA: Diagnosis not present

## 2022-01-01 DIAGNOSIS — G9339 Other post infection and related fatigue syndromes: Secondary | ICD-10-CM | POA: Diagnosis not present

## 2022-01-01 DIAGNOSIS — I701 Atherosclerosis of renal artery: Secondary | ICD-10-CM

## 2022-01-01 DIAGNOSIS — E785 Hyperlipidemia, unspecified: Secondary | ICD-10-CM | POA: Diagnosis not present

## 2022-01-01 DIAGNOSIS — I358 Other nonrheumatic aortic valve disorders: Secondary | ICD-10-CM | POA: Diagnosis not present

## 2022-01-01 DIAGNOSIS — N1832 Chronic kidney disease, stage 3b: Secondary | ICD-10-CM | POA: Diagnosis not present

## 2022-01-01 DIAGNOSIS — I739 Peripheral vascular disease, unspecified: Secondary | ICD-10-CM | POA: Diagnosis not present

## 2022-01-01 DIAGNOSIS — J4489 Other specified chronic obstructive pulmonary disease: Secondary | ICD-10-CM | POA: Diagnosis not present

## 2022-01-01 DIAGNOSIS — E860 Dehydration: Secondary | ICD-10-CM | POA: Diagnosis not present

## 2022-01-01 DIAGNOSIS — M1990 Unspecified osteoarthritis, unspecified site: Secondary | ICD-10-CM | POA: Diagnosis not present

## 2022-01-01 DIAGNOSIS — I129 Hypertensive chronic kidney disease with stage 1 through stage 4 chronic kidney disease, or unspecified chronic kidney disease: Secondary | ICD-10-CM | POA: Diagnosis not present

## 2022-01-01 DIAGNOSIS — I2584 Coronary atherosclerosis due to calcified coronary lesion: Secondary | ICD-10-CM | POA: Diagnosis not present

## 2022-01-01 DIAGNOSIS — Z8249 Family history of ischemic heart disease and other diseases of the circulatory system: Secondary | ICD-10-CM | POA: Diagnosis not present

## 2022-01-01 DIAGNOSIS — Z85828 Personal history of other malignant neoplasm of skin: Secondary | ICD-10-CM | POA: Diagnosis not present

## 2022-01-01 DIAGNOSIS — Q2112 Patent foramen ovale: Secondary | ICD-10-CM | POA: Diagnosis not present

## 2022-01-01 DIAGNOSIS — R55 Syncope and collapse: Secondary | ICD-10-CM

## 2022-01-01 DIAGNOSIS — I480 Paroxysmal atrial fibrillation: Secondary | ICD-10-CM | POA: Diagnosis not present

## 2022-01-01 DIAGNOSIS — U099 Post covid-19 condition, unspecified: Secondary | ICD-10-CM | POA: Diagnosis not present

## 2022-01-01 DIAGNOSIS — D860 Sarcoidosis of lung: Secondary | ICD-10-CM | POA: Diagnosis not present

## 2022-01-01 DIAGNOSIS — I10 Essential (primary) hypertension: Secondary | ICD-10-CM | POA: Diagnosis not present

## 2022-01-01 DIAGNOSIS — K219 Gastro-esophageal reflux disease without esophagitis: Secondary | ICD-10-CM | POA: Diagnosis not present

## 2022-01-01 DIAGNOSIS — D62 Acute posthemorrhagic anemia: Secondary | ICD-10-CM | POA: Diagnosis not present

## 2022-01-01 DIAGNOSIS — I25119 Atherosclerotic heart disease of native coronary artery with unspecified angina pectoris: Secondary | ICD-10-CM | POA: Diagnosis not present

## 2022-01-01 DIAGNOSIS — Z79899 Other long term (current) drug therapy: Secondary | ICD-10-CM | POA: Diagnosis not present

## 2022-01-01 DIAGNOSIS — E782 Mixed hyperlipidemia: Secondary | ICD-10-CM | POA: Diagnosis not present

## 2022-01-01 DIAGNOSIS — E8770 Fluid overload, unspecified: Secondary | ICD-10-CM | POA: Diagnosis not present

## 2022-01-01 DIAGNOSIS — D869 Sarcoidosis, unspecified: Secondary | ICD-10-CM

## 2022-01-01 DIAGNOSIS — N179 Acute kidney failure, unspecified: Secondary | ICD-10-CM | POA: Diagnosis not present

## 2022-01-01 DIAGNOSIS — K5900 Constipation, unspecified: Secondary | ICD-10-CM | POA: Diagnosis not present

## 2022-01-01 LAB — ECHOCARDIOGRAM COMPLETE
Area-P 1/2: 2.27 cm2
Height: 62 in
S' Lateral: 2.1 cm
Weight: 2207.99 oz

## 2022-01-01 MED ORDER — SODIUM CHLORIDE 0.9% FLUSH: 3.0000 mL | Freq: Two times a day (BID) | INTRAVENOUS | Status: DC

## 2022-01-01 MED ORDER — ONDANSETRON HCL 4 MG/2ML IJ SOLN: 4.0000 mg | Freq: Four times a day (QID) | INTRAMUSCULAR | Status: DC | PRN

## 2022-01-01 MED ORDER — ENOXAPARIN SODIUM 30 MG/0.3ML IJ SOSY
30.0000 mg | PREFILLED_SYRINGE | INTRAMUSCULAR | Status: DC
  Administered 2022-01-01: 30 mg via SUBCUTANEOUS
  Filled 2022-01-01: qty 0.3

## 2022-01-01 MED ORDER — MOMETASONE FURO-FORMOTEROL FUM 200-5 MCG/ACT IN AERO
2.0000 | INHALATION_SPRAY | Freq: Two times a day (BID) | RESPIRATORY_TRACT | Status: DC
  Administered 2022-01-02 – 2022-01-08 (×11): 2 via RESPIRATORY_TRACT
  Filled 2022-01-01: qty 8.8

## 2022-01-01 MED ORDER — ONDANSETRON HCL 4 MG PO TABS: 4.0000 mg | ORAL_TABLET | Freq: Four times a day (QID) | ORAL | Status: DC | PRN

## 2022-01-01 MED ORDER — ACETAMINOPHEN 650 MG RE SUPP: 650.0000 mg | Freq: Four times a day (QID) | RECTAL | Status: DC | PRN

## 2022-01-01 MED ORDER — SODIUM CHLORIDE 0.9 % WEIGHT BASED INFUSION: 3.0000 mL/kg/h | INTRAVENOUS | Status: DC

## 2022-01-01 MED ORDER — SODIUM CHLORIDE 0.9% FLUSH: 3.0000 mL | INTRAVENOUS | Status: DC | PRN

## 2022-01-01 MED ORDER — ORAL CARE MOUTH RINSE: 15.0000 mL | OROMUCOSAL | Status: DC | PRN

## 2022-01-01 MED ORDER — SODIUM CHLORIDE 0.9 % IV SOLN: INTRAVENOUS | Status: DC

## 2022-01-01 MED ORDER — ACETAMINOPHEN 325 MG PO TABS: 650.0000 mg | ORAL_TABLET | Freq: Four times a day (QID) | ORAL | Status: DC | PRN

## 2022-01-01 MED ORDER — AMLODIPINE BESYLATE 10 MG PO TABS
10.0000 mg | ORAL_TABLET | Freq: Every day | ORAL | Status: DC
  Administered 2022-01-01 – 2022-01-03 (×3): 10 mg via ORAL
  Filled 2022-01-01 (×3): qty 1

## 2022-01-01 MED ORDER — HYDRALAZINE HCL 20 MG/ML IJ SOLN: 5.0000 mg | INTRAMUSCULAR | Status: DC | PRN

## 2022-01-01 MED ORDER — UMECLIDINIUM BROMIDE 62.5 MCG/ACT IN AEPB
1.0000 | INHALATION_SPRAY | Freq: Every day | RESPIRATORY_TRACT | Status: DC
  Filled 2022-01-01 (×2): qty 7

## 2022-01-01 MED ORDER — SODIUM CHLORIDE 0.9 % IV SOLN: 250.0000 mL | INTRAVENOUS | Status: DC | PRN

## 2022-01-01 MED ORDER — SODIUM CHLORIDE 0.9 % WEIGHT BASED INFUSION
1.0000 mL/kg/h | INTRAVENOUS | Status: DC
  Administered 2022-01-02: 1 mL/kg/h via INTRAVENOUS

## 2022-01-01 MED ORDER — PANTOPRAZOLE SODIUM 40 MG PO TBEC
40.0000 mg | DELAYED_RELEASE_TABLET | Freq: Every day | ORAL | Status: DC
  Administered 2022-01-01 – 2022-01-03 (×3): 40 mg via ORAL
  Filled 2022-01-01 (×3): qty 1

## 2022-01-01 MED ORDER — ROSUVASTATIN CALCIUM 20 MG PO TABS
40.0000 mg | ORAL_TABLET | Freq: Every day | ORAL | Status: DC
  Administered 2022-01-01 – 2022-01-13 (×12): 40 mg via ORAL
  Filled 2022-01-01 (×12): qty 2

## 2022-01-01 MED ORDER — FLUTICASONE PROPIONATE 50 MCG/ACT NA SUSP
2.0000 | Freq: Every day | NASAL | Status: DC
  Administered 2022-01-03 – 2022-01-13 (×10): 2 via NASAL
  Filled 2022-01-01: qty 16

## 2022-01-01 MED ORDER — BUDESON-GLYCOPYRROL-FORMOTEROL 160-9-4.8 MCG/ACT IN AERO: 2.0000 | INHALATION_SPRAY | Freq: Two times a day (BID) | RESPIRATORY_TRACT | Status: DC

## 2022-01-01 MED ORDER — SODIUM CHLORIDE 0.9 % IV BOLUS
500.0000 mL | Freq: Once | INTRAVENOUS | Status: AC
  Administered 2022-01-01: 500 mL via INTRAVENOUS

## 2022-01-01 MED ORDER — ASPIRIN 81 MG PO TBEC
81.0000 mg | DELAYED_RELEASE_TABLET | Freq: Every day | ORAL | Status: DC
  Administered 2022-01-01 – 2022-01-03 (×3): 81 mg via ORAL
  Filled 2022-01-01 (×3): qty 1

## 2022-01-01 MED ORDER — ALBUTEROL SULFATE (2.5 MG/3ML) 0.083% IN NEBU
2.5000 mg | INHALATION_SOLUTION | RESPIRATORY_TRACT | Status: DC | PRN
  Administered 2022-01-11: 2.5 mg via RESPIRATORY_TRACT
  Filled 2022-01-01 (×2): qty 3

## 2022-01-01 MED ORDER — LACTATED RINGERS IV SOLN: INTRAVENOUS | Status: DC

## 2022-01-01 MED ORDER — ALBUTEROL SULFATE (2.5 MG/3ML) 0.083% IN NEBU
2.5000 mg | INHALATION_SOLUTION | Freq: Every day | RESPIRATORY_TRACT | Status: DC
  Administered 2022-01-01: 2.5 mg via RESPIRATORY_TRACT
  Filled 2022-01-01: qty 3

## 2022-01-01 NOTE — Plan of Care (Signed)
  Problem: Education: Goal: Knowledge of General Education information will improve Description: Including pain rating scale, medication(s)/side effects and non-pharmacologic comfort measures Outcome: Progressing   Problem: Health Behavior/Discharge Planning: Goal: Ability to manage health-related needs will improve Outcome: Progressing   Problem: Clinical Measurements: Goal: Ability to maintain clinical measurements within normal limits will improve Outcome: Progressing Goal: Will remain free from infection Outcome: Progressing Goal: Diagnostic test results will improve Outcome: Progressing Goal: Respiratory complications will improve Outcome: Progressing Goal: Cardiovascular complication will be avoided Outcome: Progressing   Problem: Activity: Goal: Risk for activity intolerance will decrease Outcome: Progressing   Problem: Nutrition: Goal: Adequate nutrition will be maintained Outcome: Progressing   Problem: Coping: Goal: Level of anxiety will decrease Outcome: Progressing   Problem: Elimination: Goal: Will not experience complications related to bowel motility Outcome: Progressing Goal: Will not experience complications related to urinary retention Outcome: Progressing   Problem: Pain Managment: Goal: General experience of comfort will improve Outcome: Progressing   Problem: Safety: Goal: Ability to remain free from injury will improve Outcome: Progressing   Problem: Skin Integrity: Goal: Risk for impaired skin integrity will decrease Outcome: Progressing   Problem: Education: Goal: Knowledge of condition and prescribed therapy will improve Outcome: Progressing   Problem: Cardiac: Goal: Will achieve and/or maintain adequate cardiac output Outcome: Progressing   Problem: Physical Regulation: Goal: Complications related to the disease process, condition or treatment will be avoided or minimized Outcome: Progressing   Problem: Education: Goal:  Understanding of CV disease, CV risk reduction, and recovery process will improve Outcome: Progressing Goal: Individualized Educational Video(s) Outcome: Progressing   Problem: Activity: Goal: Ability to return to baseline activity level will improve Outcome: Progressing   Problem: Cardiovascular: Goal: Ability to achieve and maintain adequate cardiovascular perfusion will improve Outcome: Progressing Goal: Vascular access site(s) Level 0-1 will be maintained Outcome: Progressing   Problem: Health Behavior/Discharge Planning: Goal: Ability to safely manage health-related needs after discharge will improve Outcome: Progressing

## 2022-01-01 NOTE — H&P (View-Only) (Signed)
Cardiology Consultation   Patient ID: Kristen Suarez MRN: [id number]; DOB: 21-Apr-1945  Admit date: 12/31/2021 Date of Consult: 01/01/2022  PCP:  Flossie Buffy, NP   [place] [facility] Cardiologist:  Skeet Latch, MD  Electrophysiologist:  Virl Axe, MD    Patient Profile:   Kristen Suarez is a 76 y.o. female with a hx of HTN, sarcoidosis, renal artery stenosis, coronary calcification on CT who is being seen 01/01/2022 for the evaluation of syncope at the request of Dr. Lorin Mercy.  History of Present Illness:   Kristen Suarez is a 76 year old female with above medical history who is followed by Dr. Oval Linsey, Dr. Caryl Comes.  Patient had an implantable loop recorder placed while living in [place] in 03/2017 for evaluation of syncope.  Since moving to [place], this has been followed by Dr. Caryl Comes.  Patient was referred to Dr. Oval Linsey in 2022 for management of complex hypertension.  Renal artery duplex in 08/2020 showed significant right renal artery stenosis with peak velocity 291 with bruit, no flow into left renal artery.  Patient had angiography with Dr. Cicero Duck in 11/2020 that showed a chronically occluded left renal artery with severe ostial stenosis in the right artery.  Underwent successful angioplasty and stent placement to right renal artery.  A few days postprocedure, patient had hypotension and near syncope.  Her blood pressure medications were adjusted.  Patient had an ED visit in 01/03/2021 after she had a syncopal episode while doing chair yoga.  Her BP on arrival to the ED was 118/68.  Again, her blood pressure medications were adjusted.  Follow-up echocardiogram on 02/16/2021 showed LVEF 60-65%, mid LV cavitary dynamic gradient due to hyperdynamic LVEF, no regional wall motion abnormalities, grade 1 diastolic dysfunction, small pericardial effusion, aortic valve sclerosis without stenosis.  Carotid duplex in 02/2021 showed bilateral vessels were near normal  with only minimal thickening or plaque.  In 10/2021, patient complained of dyspnea on exertion.  Is possible that it could be related to patient's known pulmonary sarcoid, however Dr. Caryl Comes recommended coronary CTA to evaluate for CAD.  Coronary CT on 11/20/2021 showed stable CT manifestations of sarcoidosis and chest.  Also showed severe CAD.  Lesions in mLAD and mRCA were concerning for 70-99% stenosis.  Study was sent for FFR, showed hemodynamically significant stenoses in mid LAD and mid RCA.  Patient presented to the ED on 10/16 after she had a syncopal episode. Labs in ED showed Na 138, K 3.2, creatinine 1.44, WBC 10.9, hemoglobin 14.7, platelets 321. hsTn 4>>3.  EKG showed normal sinus rhythm, HR 61 BPM, questionable early repolarization.  Chest x-ray redemonstrated bilateral upper lobe scarring and volume loss with superior retraction of the left hila, consistent with history of sarcoidosis.  Patient reports that she has long history of syncope.  Reports that up until 2018, she had been having syncopal episodes about every 18 months for over 20 years.  In 2018, she had a loop recorder implanted.  Loop recorder lasted for 3 years before the battery died.  In those 3 years, patient did not have any syncopal episodes.  Patient most recently had an episode of syncope in 11/2020.  Yesterday, she was sitting in a chair doing chair yoga.  She began to feel very hot.  She took off her jacket, but continued to feel very hot, sweaty.  She felt like she was going to faint so she went to sit down on the ground.  She does not remember sitting on  the ground, woke up a few minutes later feeling "woozy".  She denies having any chest pain, palpitations, shortness of breath during the episode.  Patient denies recent chest pain on exertion, however does have issues with shortness of breath on exertion.  She does attribute her shortness of breath on exertion to her sarcoidosis.  Patient has a long history of difficult to  treat hypertension.  She also has a significant family history of coronary artery disease (father had MI in 58s, 2 brothers with MIs in 88s, 1 brother with MI in 74s, mother with heart problems in her 37s.)  She denies history of tobacco use  Past Medical History:  Diagnosis Date   Aortic valve sclerosis 09/25/2021   Arthritis    Asthma    CAD in native artery 08/15/2020   Chickenpox    Colon polyps 2010   Epistaxis 03/29/2021   Heart murmur    History of fainting spells of unknown cause    Hyperlipidemia    Hypertension    Renal artery stenosis ([place]) 09/25/2021   Sarcoid 06/16/1976    Past Surgical History:  Procedure Laterality Date   ABDOMINAL HYSTERECTOMY  1987   BASAL CELL CARCINOMA EXCISION  06/30/2020   nose   LUNG BIOPSY  06/16/1976   PERIPHERAL VASCULAR INTERVENTION Right 11/29/2020   Procedure: PERIPHERAL VASCULAR INTERVENTION;  Surgeon: Wellington Hampshire, MD;  Location: [facility];  Service: Cardiovascular;  Laterality: Right;  Renal Artery   RENAL ANGIOGRAPHY N/A 11/29/2020   Procedure: RENAL ANGIOGRAPHY;  Surgeon: Wellington Hampshire, MD;  Location: [facility];  Service: Cardiovascular;  Laterality: N/A;     Home Medications:  Prior to Admission medications   Medication Sig Start Date End Date Taking? Authorizing Provider  albuterol (PROVENTIL) (2.5 MG/3ML) 0.083% nebulizer solution Take 3 mLs (2.5 mg total) by nebulization every 6 (six) hours as needed for wheezing or shortness of breath. USE 1 VIAL VIA NEBULIZER EVERY 6 HOURS Strength: (2.5 MG/3ML) 0.083% 03/07/21  Yes Cobb, Karie Schwalbe, NP  albuterol (VENTOLIN HFA) 108 (90 Base) MCG/ACT inhaler Inhale 2 puffs into the lungs every 6 (six) hours as needed for wheezing or shortness of breath. 01/03/20  Yes Byrum, Rose Fillers, MD  amLODIPine-Valsartan-HCTZ (EXFORGE HCT) 10-160-12.5 MG TABS Take 1 tablet by mouth daily. 11/12/21  Yes Skeet Latch, MD  aspirin EC 81 MG tablet Take 81 mg by mouth daily. Swallow  whole.   Yes [provider]  baclofen (LIORESAL) 10 MG tablet Take 1 tablet (10 mg total) by mouth 3 (three) times daily as needed for muscle spasms. 11/13/21  Yes Gregor Hams, MD  Budeson-Glycopyrrol-Formoterol (BREZTRI AEROSPHERE) 160-9-4.8 MCG/ACT AERO Inhale 2 puffs into the lungs in the morning and at bedtime. 04/12/21  Yes Cobb, Karie Schwalbe, NP  Calcium Carb-Cholecalciferol (CALCIUM 600 + D PO) Take 1 tablet by mouth daily.   Yes [provider]  cetirizine (ZYRTEC) 10 MG tablet Take 10 mg by mouth daily as needed for allergies.   Yes [provider]  empagliflozin ([name]) 10 MG TABS tablet Take 1 tablet (10 mg total) by mouth daily before breakfast. 10/29/21  Yes Deboraha Sprang, MD  famotidine (PEPCID) 10 MG tablet Take 10 mg by mouth daily as needed for heartburn.   Yes [provider]  mometasone (NASONEX) 50 MCG/ACT nasal spray SHAKE LIQUID AND USE 2 SPRAYS IN EACH NOSTRIL DAILY 12/18/21  Yes Nche, [name], NP  Multiple Vitamins-Minerals (MULTIVITAL PO) Take 1  tablet by mouth daily.    Yes [provider]  pantoprazole (PROTONIX) 40 MG tablet TAKE 1 TABLET BY MOUTH EVERY DAY 12/25/21  Yes Byrum, Rose Fillers, MD  Probiotic Product (PROBIOTIC DAILY PO) Take 1 tablet by mouth daily.    Yes [provider]  rosuvastatin (CRESTOR) 40 MG tablet Take 1 tablet (40 mg total) by mouth daily. 12/25/20 01/01/22 Yes Skeet Latch, MD  sodium chloride (OCEAN) 0.65 % SOLN nasal spray Place 1 spray into both nostrils as needed for congestion.   Yes [provider]  acetaminophen (TYLENOL) 500 MG tablet Take 500 mg by mouth every 6 (six) hours as needed for moderate pain.    [provider]  docusate sodium (COLACE) 100 MG capsule Take 100 mg by mouth daily.     [provider]  metoprolol tartrate (LOPRESSOR) 100 MG tablet Take 1 tablet (100 mg total) by mouth once for 1 dose. Take 1-2 hours prior to CT scan. 10/29/21  10/29/21  Deboraha Sprang, MD    Inpatient Medications: Scheduled Meds:  aspirin EC  81 mg Oral Daily   Budeson-Glycopyrrol-Formoterol  2 puff Inhalation BID   enoxaparin (LOVENOX) injection  40 mg Subcutaneous Q24H   [START ON 01/02/2022] fluticasone  2 spray Each Nare Daily   [START ON 01/02/2022] pantoprazole  40 mg Oral Daily   rosuvastatin  40 mg Oral Daily   sodium chloride flush  3 mL Intravenous Q12H   Continuous Infusions:  lactated ringers     PRN Meds: acetaminophen **OR** acetaminophen, albuterol, hydrALAZINE, ondansetron **OR** ondansetron (ZOFRAN) IV, mouth rinse  Allergies:    Allergies  Allergen Reactions   Keflex [Cephalexin] Rash   Penicillins Itching   Sulfa Antibiotics Itching    Social History:   Social History   Socioeconomic History   Marital status: Married    Spouse name: Not on file   Number of children: Not on file   Years of education: Not on file   Highest education level: Not on file  Occupational History   Occupation: retired  Tobacco Use   Smoking status: Never    Passive exposure: Past   Smokeless tobacco: Never  Vaping Use   Vaping Use: Never used  Substance and Sexual Activity   Alcohol use: Yes    Comment: once in a while   Drug use: Never   Sexual activity: Not Currently  Other Topics Concern   Not on file  Social History Narrative   Not on file   Social Determinants of Health   Financial Resource Strain: Low Risk  (02/13/2021)   Overall Financial Resource Strain (CARDIA)    Difficulty of Paying Living Expenses: Not hard at all  Food Insecurity: No Food Insecurity (01/01/2022)   Hunger Vital Sign    Worried About Running Out of Food in the Last Year: Never true    [place] in the Last Year: Never true  Transportation Needs: No Transportation Needs (01/01/2022)   PRAPARE - Hydrologist (Medical): No    Lack of Transportation (Non-Medical): No  Physical Activity: Insufficiently  Active (02/13/2021)   Exercise Vital Sign    Days of Exercise per Week: 2 days    Minutes of Exercise per Session: 20 min  Stress: No Stress Concern Present (02/13/2021)   [place]    Feeling of Stress : Not at all  Social Connections: Moderately Isolated (02/13/2021)  Social Licensed conveyancer [NHANES]    Frequency of Communication with Friends and Family: Twice a week    Frequency of Social Gatherings with Friends and Family: Twice a week    Attends Religious Services: Never    Marine scientist or Organizations: No    Attends Archivist Meetings: Never    Marital Status: Married  Human resources officer Violence: Not At Risk (01/01/2022)   Humiliation, Afraid, Rape, and Kick questionnaire    Fear of Current or Ex-Partner: No    Emotionally Abused: No    Physically Abused: No    Sexually Abused: No    Family History:    Family History  Problem Relation Age of Onset   Arthritis Mother    Cancer Mother    Depression Mother    Heart attack Mother    Hyperlipidemia Mother    Hypertension Mother    Alcohol abuse Father    Heart disease Father    Hypertension Father    Hyperlipidemia Father    Heart disease Sister    Stroke Sister    Alcohol abuse Brother    Heart disease Brother    Hypertension Brother    Hyperlipidemia Brother    Multiple sclerosis Daughter    Hyperlipidemia Son    Heart disease Maternal Grandfather    Heart disease Paternal Grandfather    Hyperlipidemia Sister    Alcohol abuse Sister    Hyperlipidemia Sister    Hypertension Sister    Alcohol abuse Brother    Early death Brother    Heart attack Brother    Heart disease Brother    Hyperlipidemia Brother      ROS:  Please see the history of present illness.   All other ROS reviewed and negative.     Physical Exam/Data:   Vitals:   01/01/22 0815 01/01/22 0900 01/01/22 1129 01/01/22 1259  BP: (!) 162/71  (!) 146/67 (!) 143/69 (!) 145/73  Pulse: 76 86 80 80  Resp: (!) '24 17 15 19  '$ Temp:   98.7 F (37.1 C) 98.9 F (37.2 C)  TempSrc:   Oral Oral  SpO2: 99% 97% 97% 97%  Weight:    62.6 kg  Height:    '5\' 2"'$  (1.575 m)    Intake/Output Summary (Last 24 hours) at 01/01/2022 1453 Last data filed at 01/01/2022 1130 Gross per 24 hour  Intake 1965.16 ml  Output 1625 ml  Net 340.16 ml      01/01/2022   12:59 PM 12/31/2021   11:08 AM 11/13/2021   10:18 AM  Last 3 Weights  Weight (lbs) 138 lb 138 lb 144 lb 6.4 oz  Weight (kg) 62.596 kg 62.596 kg 65.499 kg     Body mass index is 25.24 kg/m.  General:  Well nourished, well developed, in no acute distress. Laying flat in the bed  HEENT: normal Neck: no JVD Vascular: No carotid bruits; Radial pulses 2+ bilaterally Cardiac:  normal S1, S2; RRR; Grade 3/6 systolic murmur throughout pericardium  Lungs:  expiratory wheezes and fine crackles heard throughout  Abd: soft, nontender, no hepatomegaly  Ext: no edema Musculoskeletal:  No deformities, BUE and BLE strength normal and equal Skin: warm and dry  Neuro:  CNs 2-12 intact, no focal abnormalities noted Psych:  Normal affect   EKG:  The EKG was personally reviewed and demonstrates:  normal sinus rhythm, HR 61 BPM, questionable early repolarization Telemetry:  Telemetry was personally reviewed and demonstrates: Normal sinus rhythm  Relevant CV Studies:   Laboratory Data:  High Sensitivity Troponin:   Recent Labs  Lab 12/31/21 1106 12/31/21 1415  TROPONINIHS 4 3     Chemistry Recent Labs  Lab 12/31/21 1106  NA 138  K 3.2*  CL 97*  CO2 26  GLUCOSE 140*  BUN 21  CREATININE 1.44*  CALCIUM 10.1  GFRNONAA 38*  ANIONGAP 15    Recent Labs  Lab 12/31/21 1106  PROT 7.6  ALBUMIN 4.6  AST 21  ALT 16  ALKPHOS 79  BILITOT 0.5   Lipids No results for input(s): "CHOL", "TRIG", "HDL", "LABVLDL", "LDLCALC", "CHOLHDL" in the last 168 hours.  Hematology Recent Labs  Lab  12/31/21 1106  WBC 10.9*  RBC 4.88  HGB 14.7  HCT 43.7  MCV 89.5  MCH 30.1  MCHC 33.6  RDW 13.2  PLT 321   Thyroid No results for input(s): "TSH", "FREET4" in the last 168 hours.  BNPNo results for input(s): "BNP", "PROBNP" in the last 168 hours.  DDimer No results for input(s): "DDIMER" in the last 168 hours.   Radiology/Studies:  DG Chest Port 1 View  Result Date: 12/31/2021 CLINICAL DATA:  Syncope, history of sarcoidosis EXAM: PORTABLE CHEST 1 VIEW COMPARISON:  02/05/2021 FINDINGS: Normal cardiac and mediastinal contours. Aortic atherosclerosis. Redemonstrated bilateral upper lobe scarring and volume loss with superior retraction of the left hila. No new focal pulmonary opacity. No pleural effusion or pneumothorax. No acute osseous abnormality. IMPRESSION: Redemonstrated bilateral upper lobe scarring and volume loss with superior retraction of the left hila, consistent with the patient's history of sarcoidosis. No new focal pulmonary opacity. Electronically Signed   By: Merilyn Baba M.D.   On: 12/31/2021 11:43   CT Head Wo Contrast  Result Date: 12/31/2021 CLINICAL DATA:  Syncope EXAM: CT HEAD WITHOUT CONTRAST TECHNIQUE: Contiguous axial images were obtained from the base of the skull through the vertex without intravenous contrast. RADIATION DOSE REDUCTION: This exam was performed according to the departmental dose-optimization program which includes automated exposure control, adjustment of the mA and/or kV according to patient size and/or use of iterative reconstruction technique. COMPARISON:  CT head dated 11/16/2018 FINDINGS: Brain: No evidence of acute infarction, hemorrhage, hydrocephalus, extra-axial collection or mass lesion/mass effect. Vascular: No hyperdense vessel or unexpected calcification. Skull: Similar appearance of right parietal hemangioma. No acute fracture or dislocation. Sinuses/Orbits: Bilateral pseudophakia. Right maxillary mucous retention cyst. The sinuses and  mastoid air cells are otherwise clear. Other: None. IMPRESSION: 1. No acute intracranial or large territorial infarction. 2. No acute fracture or dislocation. Electronically Signed   By: Darrin Nipper M.D.   On: 12/31/2021 11:42     Assessment and Plan:   CAD  -Patient had a coronary CT scan on 9/5 that showed hemodynamically significant stenoses in the mid LAD and mid RCA.  Recommended cardiac catheterization for further evaluation -She has an outpatient appointment on 10/30 to arrange cardiac cath.  However, given patient's complaints of recurrent syncope, dyspnea on exertion, abnormal Coronary CT, we should arrange cardiac catheterization this admission - Note that patient only has one functional kidney due to a chronically occluded left renal artery. Creatinine 1.44 on presentation--baseline appears to be around 1.2. Hold nephrotoxic medications for now to preserve renal function prior to cath. Patient has also been given IV fluids, we will recheck BMP in the morning    Syncope  -Patient has had a longstanding history of recurrent syncope.  However, she believes that this has been getting worse over time. -  Patient previous had a loop recorder, did not have any episodes of syncope during the 3 years that the battery was good.  Battery has since died -She saw Dr. Caryl Comes in 11/04/2021.  He thought that syncope could be neurally mediated in the setting of the known pulmonary sarcoidosis.  However, he recommended coronary CT scan that was abnormal as above  -Most recent echocardiogram was completed 02/2021.  Showed EF 60-65%, no regional wall motion abnormalities, grade 1 diastolic dysfunction, normal RV systolic function -Carotid duplex scans on 02/2021 showed that bilateral carotid arteries were near normal with only minimal thickening/plaque - Update echocardiogram  -Plan for cardiac catheterization this admission -Consider monitor at discharge  Hypertension - BP was low normal in the ED. However, BP  now increased to SBP 140s-160s  - Resume home amlodipine 10 mg daily   Renal artery stenosis  - Patient had stenting of the right renal artery in 11/2020. Also has a chronically occluded left renal artery  - Followed as an outpatient   Sarcoidosis -Continue home regimen of albuterol, Breztri   Risk Assessment/Risk Scores:                For questions or updates, please contact [place] Please consult [email or web address] for contact info under    Signed, Margie Billet, PA-C  01/01/2022 2:53 PM

## 2022-01-01 NOTE — ED Notes (Signed)
Report given to the Floor RN. 

## 2022-01-01 NOTE — Progress Notes (Signed)
Echocardiogram 2D Echocardiogram has been performed.  Oneal Deputy Donyell Ding RDCS 01/01/2022, 5:01 PM

## 2022-01-01 NOTE — ED Notes (Signed)
Pt was helped to [facility] to void.  Ice water has been given and pt was given chicken broth

## 2022-01-01 NOTE — ED Notes (Signed)
Report given to [facility]. 

## 2022-01-01 NOTE — H&P (Signed)
History and Physical    Patient: Kristen Suarez:[id number] DOB: [birth year] DOA: 12/31/2021 DOS: the patient was seen and examined on 01/01/2022 PCP: Flossie Buffy, NP  Patient coming from: Home - lives with husband; NOK: Husband,     [phone]   Chief Complaint: Syncope  HPI: Kristen Suarez is a 76 y.o. female with medical history significant of CAD, HTN, sarcoidosis, and renal artery stenosis presenting with syncope. Yesterday AM she was in chair yoga (not a new class).  She felt warm and took off her jacket and did exercises. She had a wave of feeling hot, became diaphoretic. She had a similar episode in the past and decided to sit on the floor.  She passed out, remembers someone saying to put her jacket under her head.  She was out for at least a few seconds.  She was very woozy afterwards.  It took 3 people to help her into a wheelchair.  She was more slow to recover than in the past.  She had to work very hard to get speech to work and was slow with it.  No CP or SOB.  Last Friday, Dr. Caryl Comes called and said they would schedule a heart cath regarding her prior abnormal CTA.  She has had a lot of extra fatigue since having COVID in 03/2020.  She has had recurrent syncope for over 20 years.   The episodes seem to last a little longer every time and are harder to recover from.      ER Course:  [facility] transfer, per Dr. Doristine Bosworth:  Patient was sitting in the chair about 1025AM on 10/16 and she suddenly passed out.  According to witnesses, she hit her head but patient does not recall.  Upon arrival to the ED, patient was found to be clinically dehydrated, blood pressure was on the low normal side.  CT head was unremarkable.  Chest x-ray unremarkable.  Patient was given IV fluids.  EKG unremarkable.  In brief patient had cardiac CT done last month and was found to have coronary stenosis so ED physician discussed the case with cardiology who recommended admission under  hospitalist service and that they plan to do cardiac catheter during this hospitalization.     Review of Systems: As mentioned in the history of present illness. All other systems reviewed and are negative. Past Medical History:  Diagnosis Date   Aortic valve sclerosis 09/25/2021   Arthritis    Asthma    CAD in native artery 08/15/2020   Chickenpox    Colon polyps 2010   Epistaxis 03/29/2021   Heart murmur    History of fainting spells of unknown cause    Hyperlipidemia    Hypertension    Renal artery stenosis ([place]) 09/25/2021   Sarcoid 06/16/1976   Past Surgical History:  Procedure Laterality Date   ABDOMINAL HYSTERECTOMY  1987   BASAL CELL CARCINOMA EXCISION  06/30/2020   nose   LUNG BIOPSY  06/16/1976   PERIPHERAL VASCULAR INTERVENTION Right 11/29/2020   Procedure: PERIPHERAL VASCULAR INTERVENTION;  Surgeon: Wellington Hampshire, MD;  Location: [place] [facility];  Service: Cardiovascular;  Laterality: Right;  Renal Artery   RENAL ANGIOGRAPHY N/A 11/29/2020   Procedure: RENAL ANGIOGRAPHY;  Surgeon: Wellington Hampshire, MD;  Location: [place] [facility];  Service: Cardiovascular;  Laterality: N/A;   Social History:  reports that she has never smoked. She has been exposed to tobacco smoke. She has never used smokeless tobacco. She reports  current alcohol use. She reports that she does not use drugs.  Allergies  Allergen Reactions   Keflex [Cephalexin] Rash   Penicillins Itching   Sulfa Antibiotics Itching    Family History  Problem Relation Age of Onset   Arthritis Mother    Cancer Mother    Depression Mother    Heart attack Mother    Hyperlipidemia Mother    Hypertension Mother    Alcohol abuse Father    Heart disease Father    Hypertension Father    Hyperlipidemia Father    Heart disease Sister    Stroke Sister    Alcohol abuse Brother    Heart disease Brother    Hypertension Brother    Hyperlipidemia Brother    Multiple sclerosis Daughter    Hyperlipidemia Son     Heart disease Maternal Grandfather    Heart disease Paternal Grandfather    Hyperlipidemia Sister    Alcohol abuse Sister    Hyperlipidemia Sister    Hypertension Sister    Alcohol abuse Brother    Early death Brother    Heart attack Brother    Heart disease Brother    Hyperlipidemia Brother     Prior to Admission medications   Medication Sig Start Date End Date Taking? Authorizing Provider  albuterol (PROVENTIL) (2.5 MG/3ML) 0.083% nebulizer solution Take 3 mLs (2.5 mg total) by nebulization every 6 (six) hours as needed for wheezing or shortness of breath. USE 1 VIAL VIA NEBULIZER EVERY 6 HOURS Strength: (2.5 MG/3ML) 0.083% 03/07/21  Yes Cobb, Karie Schwalbe, NP  albuterol (VENTOLIN HFA) 108 (90 Base) MCG/ACT inhaler Inhale 2 puffs into the lungs every 6 (six) hours as needed for wheezing or shortness of breath. 01/03/20  Yes Byrum, Rose Fillers, MD  amLODIPine-Valsartan-HCTZ (EXFORGE HCT) 10-160-12.5 MG TABS Take 1 tablet by mouth daily. 11/12/21  Yes Skeet Latch, MD  aspirin EC 81 MG tablet Take 81 mg by mouth daily. Swallow whole.   Yes [provider]  baclofen (LIORESAL) 10 MG tablet Take 1 tablet (10 mg total) by mouth 3 (three) times daily as needed for muscle spasms. 11/13/21  Yes Gregor Hams, MD  Budeson-Glycopyrrol-Formoterol (BREZTRI AEROSPHERE) 160-9-4.8 MCG/ACT AERO Inhale 2 puffs into the lungs in the morning and at bedtime. 04/12/21  Yes Cobb, Karie Schwalbe, NP  Calcium Carb-Cholecalciferol (CALCIUM 600 + D PO) Take 1 tablet by mouth daily.   Yes [provider]  cetirizine (ZYRTEC) 10 MG tablet Take 10 mg by mouth daily as needed for allergies.   Yes [provider]  empagliflozin ([name]) 10 MG TABS tablet Take 1 tablet (10 mg total) by mouth daily before breakfast. 10/29/21  Yes Deboraha Sprang, MD  famotidine (PEPCID) 10 MG tablet Take 10 mg by mouth daily as needed for heartburn.   Yes [provider]  mometasone (NASONEX) 50 MCG/ACT  nasal spray SHAKE LIQUID AND USE 2 SPRAYS IN EACH NOSTRIL DAILY 12/18/21  Yes Nche, [name], NP  Multiple Vitamins-Minerals (MULTIVITAL PO) Take 1 tablet by mouth daily.    Yes [provider]  pantoprazole (PROTONIX) 40 MG tablet TAKE 1 TABLET BY MOUTH EVERY DAY 12/25/21  Yes Byrum, Rose Fillers, MD  Probiotic Product (PROBIOTIC DAILY PO) Take 1 tablet by mouth daily.    Yes [provider]  rosuvastatin (CRESTOR) 40 MG tablet Take 1 tablet (40 mg total) by mouth daily. 12/25/20 01/01/22 Yes Skeet Latch, MD  sodium chloride (OCEAN) 0.65 % SOLN nasal spray Place 1  spray into both nostrils as needed for congestion.   Yes [provider]  acetaminophen (TYLENOL) 500 MG tablet Take 500 mg by mouth every 6 (six) hours as needed for moderate pain.    [provider]  COVID-19 mRNA bivalent vaccine, Pfizer, injection Inject into the muscle. 12/14/20   Carlyle Basques, MD  docusate sodium (COLACE) 100 MG capsule Take 100 mg by mouth daily.     [provider]  influenza vaccine adjuvanted (FLUAD) 0.5 ML injection Inject into the muscle. 12/22/20     influenza vaccine adjuvanted (FLUAD) 0.5 ML injection Inject into the muscle. 11/30/21     metoprolol tartrate (LOPRESSOR) 100 MG tablet Take 1 tablet (100 mg total) by mouth once for 1 dose. Take 1-2 hours prior to CT scan. 10/29/21 10/29/21  Deboraha Sprang, MD  RSV vaccine recomb adjuvanted (AREXVY) 120 MCG/0.5ML injection Inject into the muscle. 11/30/21       Physical Exam: Vitals:   01/01/22 0815 01/01/22 0900 01/01/22 1129 01/01/22 1259  BP: (!) 162/71 (!) 146/67 (!) 143/69 (!) 145/73  Pulse: 76 86 80 80  Resp: (!) '24 17 15 19  '$ Temp:   98.7 F (37.1 C) 98.9 F (37.2 C)  TempSrc:   Oral Oral  SpO2: 99% 97% 97% 97%  Weight:    62.6 kg  Height:    '5\' 2"'$  (1.575 m)   General:  Appears calm and comfortable and is in NAD Eyes:  EOMI, normal lids, iris ENT:  grossly normal hearing, lips & tongue, mmm;  appropriate dentition Neck:  no LAD, masses or thyromegaly Cardiovascular:  RRR, no m/r/g. No LE edema.  Respiratory:   CTA bilaterally with no wheezes/rales/rhonchi.  Normal respiratory effort. Abdomen:  soft, NT, ND Skin:  no rash or induration seen on limited exam Musculoskeletal:  grossly normal tone BUE/BLE, good ROM, no bony abnormality Psychiatric:  grossly normal mood and affect, speech fluent and appropriate, AOx3 Neurologic:  CN 2-12 grossly intact, moves all extremities in coordinated fashion   Radiological Exams on Admission: Independently reviewed - see discussion in A/P where applicable  DG Chest Port 1 View  Result Date: 12/31/2021 CLINICAL DATA:  Syncope, history of sarcoidosis EXAM: PORTABLE CHEST 1 VIEW COMPARISON:  02/05/2021 FINDINGS: Normal cardiac and mediastinal contours. Aortic atherosclerosis. Redemonstrated bilateral upper lobe scarring and volume loss with superior retraction of the left hila. No new focal pulmonary opacity. No pleural effusion or pneumothorax. No acute osseous abnormality. IMPRESSION: Redemonstrated bilateral upper lobe scarring and volume loss with superior retraction of the left hila, consistent with the patient's history of sarcoidosis. No new focal pulmonary opacity. Electronically Signed   By: Merilyn Baba M.D.   On: 12/31/2021 11:43   CT Head Wo Contrast  Result Date: 12/31/2021 CLINICAL DATA:  Syncope EXAM: CT HEAD WITHOUT CONTRAST TECHNIQUE: Contiguous axial images were obtained from the base of the skull through the vertex without intravenous contrast. RADIATION DOSE REDUCTION: This exam was performed according to the departmental dose-optimization program which includes automated exposure control, adjustment of the mA and/or kV according to patient size and/or use of iterative reconstruction technique. COMPARISON:  CT head dated 11/16/2018 FINDINGS: Brain: No evidence of acute infarction, hemorrhage, hydrocephalus, extra-axial collection  or mass lesion/mass effect. Vascular: No hyperdense vessel or unexpected calcification. Skull: Similar appearance of right parietal hemangioma. No acute fracture or dislocation. Sinuses/Orbits: Bilateral pseudophakia. Right maxillary mucous retention cyst. The sinuses and mastoid air cells are otherwise clear. Other: None. IMPRESSION: 1. No acute  intracranial or large territorial infarction. 2. No acute fracture or dislocation. Electronically Signed   By: Darrin Nipper M.D.   On: 12/31/2021 11:42     Coronary CTA on 9/5:  1. Severe CAD, CADRADS = 4. CT FFR will be performed and reported separately. Lesions in the mLAD and mRCA concerning for 70-99% stenosis.   2. Coronary calcium score of 1527. This was 96th percentile for age and sex matched control.   3. Normal coronary origin with right dominance.   4. Significant aortic atherosclerosis.   5. Calcified nodule seen in left breast.   INTERPRETATION:   CAD-RADS 4: Severe stenosis. (70-99% or > 50% left main). Cardiac catheterization or CT FFR is recommended. Consider symptom-guided anti-ischemic pharmacotherapy as well as risk factor modification per guideline directed care. Invasive coronary angiography recommended with revascularization per published guideline statements.    Also with: 1. Stable CT manifestations of sarcoidosis in the chest. 2.  Aortic Atherosclerosis (ICD10-I70.0).   EKG: Independently reviewed.  NSR with rate 61; nonspecific ST changes with no evidence of acute ischemia   Labs on Admission: I have personally reviewed the available labs and imaging studies at the time of the admission.  Pertinent labs:    K+ 3.2 Glucose 140 BN 21/Creatinine 1.44/GFR 38; 29/1.16/46 on 07/31/21 HS troponin 4, 3 Lactate 2.2, 0.8 WBC 10.9 UA: >1000 glucose, large LE   Assessment and Plan: Principal Problem:   Syncope Active Problems:   Sarcoidosis   HTN (hypertension)   Hyperlipidemia   Renal artery stenosis (HCC)    Chronic kidney disease, stage 3b (HCC)    Syncope -Patient with long-standing recurrent syncope, possibly getting worse over time -She previously had a loop recorder but did not have an episode during the 3 years the battery was good and has had 2 since the battery died -She saw Dr. Caryl Comes for this and he thought it was neurally mediated in the context of her sarcoidosis -However, she also had DOE and has a strong FH of CAD and so a coronary CTA was ordered and very positive (see below) -Her history is not overly suggestive of cardiogenic syncope but recurrent syncope in the setting of a positive CTA is concerning and needs further evaluation with cardiac catheterization -Will observed overnight on telemetry in the hospital pending cardiology evaluation -Neuro checks  -PT/OT eval and treat  CAD -Coronary CTA showed severe stenosis with lesions in the mLAD and mRCA concerning for 70-99% stenosis -She was planned for outpatient cardiac catheterization but this will not be performed during this hospitalization -Cardiology consult is pending  Sarcoidosis -Continue Breztri, Albuterol  HTN -Hold amlodipine/HCTZ for now -[employer] for now -Will cover with prn IV hydralazine if needed  HLD -Continue rosuvastatin  RAS -s/p stenting of the R renal artery with chronically occluded L renal artery -She is followed by Dr. Fletcher Anon and is due for repeat imaging in 06/2022  Stage 3b CKD -Appears slightly worse than prior, but this may be related to progression of CKD and/or RAS -For now, will follow -Attempt to avoid nephrotoxic medications -Recheck BMP in AM    Advance Care Planning:   Code Status: Full Code   Consults: Cardiology  DVT Prophylaxis: Lovenox  Family Communication: Husband (retired Therapist, sports) was present throughout evaluation  Severity of Illness: The appropriate patient status for this patient is OBSERVATION. Observation status is judged to be reasonable and necessary in  order to provide the required intensity of service to ensure the patient's safety. The patient's  presenting symptoms, physical exam findings, and initial radiographic and laboratory data in the context of their medical condition is felt to place them at decreased risk for further clinical deterioration. Furthermore, it is anticipated that the patient will be medically stable for discharge from the hospital within 2 midnights of admission.   Author: Karmen Bongo, MD 01/01/2022 2:45 PM  For on call review [email or web address].

## 2022-01-01 NOTE — Consult Note (Signed)
Cardiology Consultation   Patient ID: Kristen Suarez MRN: [id number]; DOB: 1945/06/09  Admit date: 12/31/2021 Date of Consult: 01/01/2022  PCP:  Flossie Buffy, NP   [facility] Prov[facility] Cardiologist:  Skeet Latch, MD  Electrophysiologist:  Virl Axe, MD    Patient Profile:   Kristen Suarez is a 76 y.o. female with a hx of HTN, sarcoidosis, renal artery stenosis, coronary calcification on CT who is being seen 01/01/2022 for the evaluation of syncope at the request of Dr. Lorin Mercy.  History of Present Illness:   Kristen Suarez is a 76 year old female with above medical history who is followed by Dr. Oval Linsey, Dr. Caryl Comes.  Patient had an implantable loop recorder placed while living in [place] in 03/2017 for evaluation of syncope.  Since moving to [place], this has been followed by Dr. Caryl Comes.  Patient was referred to Dr. Oval Linsey in 2022 for management of complex hypertension.  Renal artery duplex in 08/2020 showed significant right renal artery stenosis with peak velocity 291 with bruit, no flow into left renal artery.  Patient had angiography with Dr. Cicero Duck in 11/2020 that showed a chronically occluded left renal artery with severe ostial stenosis in the right artery.  Underwent successful angioplasty and stent placement to right renal artery.  A few days postprocedure, patient had hypotension and near syncope.  Her blood pressure medications were adjusted.  Patient had an ED visit in 01/03/2021 after she had a syncopal episode while doing chair yoga.  Her BP on arrival to the ED was 118/68.  Again, her blood pressure medications were adjusted.  Follow-up echocardiogram on 02/16/2021 showed LVEF 60-65%, mid LV cavitary dynamic gradient due to hyperdynamic LVEF, no regional wall motion abnormalities, grade 1 diastolic dysfunction, small pericardial effusion, aortic valve sclerosis without stenosis.  Carotid duplex in 02/2021 showed bilateral vessels were near normal  with only minimal thickening or plaque.  In 10/2021, patient complained of dyspnea on exertion.  Is possible that it could be related to patient's known pulmonary sarcoid, however Dr. Caryl Comes recommended coronary CTA to evaluate for CAD.  Coronary CT on 11/20/2021 showed stable CT manifestations of sarcoidosis and chest.  Also showed severe CAD.  Lesions in mLAD and mRCA were concerning for 70-99% stenosis.  Study was sent for FFR, showed hemodynamically significant stenoses in mid LAD and mid RCA.  Patient presented to the ED on 10/16 after she had a syncopal episode. Labs in ED showed Na 138, K 3.2, creatinine 1.44, WBC 10.9, hemoglobin 14.7, platelets 321. hsTn 4>>3.  EKG showed normal sinus rhythm, HR 61 BPM, questionable early repolarization.  Chest x-ray redemonstrated bilateral upper lobe scarring and volume loss with superior retraction of the left hila, consistent with history of sarcoidosis.  Patient reports that she has long history of syncope.  Reports that up until 2018, she had been having syncopal episodes about every 18 months for over 20 years.  In 2018, she had a loop recorder implanted.  Loop recorder lasted for 3 years before the battery died.  In those 3 years, patient did not have any syncopal episodes.  Patient most recently had an episode of syncope in 11/2020.  Yesterday, she was sitting in a chair doing chair yoga.  She began to feel very hot.  She took off her jacket, but continued to feel very hot, sweaty.  She felt like she was going to faint so she went to sit down on the ground.  She does not remember sitting on  the ground, woke up a few minutes later feeling "woozy".  She denies having any chest pain, palpitations, shortness of breath during the episode.  Patient denies recent chest pain on exertion, however does have issues with shortness of breath on exertion.  She does attribute her shortness of breath on exertion to her sarcoidosis.  Patient has a long history of difficult to  treat hypertension.  She also has a significant family history of coronary artery disease (father had MI in 108s, 2 brothers with MIs in 12s, 1 brother with MI in 65s, mother with heart problems in her 54s.)  She denies history of tobacco use  Past Medical History:  Diagnosis Date   Aortic valve sclerosis 09/25/2021   Arthritis    Asthma    CAD in native artery 08/15/2020   Chickenpox    Colon polyps 2010   Epistaxis 03/29/2021   Heart murmur    History of fainting spells of unknown cause    Hyperlipidemia    Hypertension    Renal artery stenosis ([place]) 09/25/2021   Sarcoid 06/16/1976    Past Surgical History:  Procedure Laterality Date   ABDOMINAL HYSTERECTOMY  1987   BASAL CELL CARCINOMA EXCISION  06/30/2020   nose   LUNG BIOPSY  06/16/1976   PERIPHERAL VASCULAR INTERVENTION Right 11/29/2020   Procedure: PERIPHERAL VASCULAR INTERVENTION;  Surgeon: Wellington Hampshire, MD;  Location: [facility];  Service: Cardiovascular;  Laterality: Right;  Renal Artery   RENAL ANGIOGRAPHY N/A 11/29/2020   Procedure: RENAL ANGIOGRAPHY;  Surgeon: Wellington Hampshire, MD;  Location: [facility];  Service: Cardiovascular;  Laterality: N/A;     Home Medications:  Prior to Admission medications   Medication Sig Start Date End Date Taking? Authorizing Provider  albuterol (PROVENTIL) (2.5 MG/3ML) 0.083% nebulizer solution Take 3 mLs (2.5 mg total) by nebulization every 6 (six) hours as needed for wheezing or shortness of breath. USE 1 VIAL VIA NEBULIZER EVERY 6 HOURS Strength: (2.5 MG/3ML) 0.083% 03/07/21  Yes Cobb, Karie Schwalbe, NP  albuterol (VENTOLIN HFA) 108 (90 Base) MCG/ACT inhaler Inhale 2 puffs into the lungs every 6 (six) hours as needed for wheezing or shortness of breath. 01/03/20  Yes Byrum, Rose Fillers, MD  amLODIPine-Valsartan-HCTZ (EXFORGE HCT) 10-160-12.5 MG TABS Take 1 tablet by mouth daily. 11/12/21  Yes Skeet Latch, MD  aspirin EC 81 MG tablet Take 81 mg by mouth daily. Swallow  whole.   Yes [provider]  baclofen (LIORESAL) 10 MG tablet Take 1 tablet (10 mg total) by mouth 3 (three) times daily as needed for muscle spasms. 11/13/21  Yes Gregor Hams, MD  Budeson-Glycopyrrol-Formoterol (BREZTRI AEROSPHERE) 160-9-4.8 MCG/ACT AERO Inhale 2 puffs into the lungs in the morning and at bedtime. 04/12/21  Yes Cobb, Karie Schwalbe, NP  Calcium Carb-Cholecalciferol (CALCIUM 600 + D PO) Take 1 tablet by mouth daily.   Yes [provider]  cetirizine (ZYRTEC) 10 MG tablet Take 10 mg by mouth daily as needed for allergies.   Yes [provider]  empagliflozin ([name]) 10 MG TABS tablet Take 1 tablet (10 mg total) by mouth daily before breakfast. 10/29/21  Yes Deboraha Sprang, MD  famotidine (PEPCID) 10 MG tablet Take 10 mg by mouth daily as needed for heartburn.   Yes [provider]  mometasone (NASONEX) 50 MCG/ACT nasal spray SHAKE LIQUID AND USE 2 SPRAYS IN EACH NOSTRIL DAILY 12/18/21  Yes Nche, [name], NP  Multiple Vitamins-Minerals (MULTIVITAL PO) Take 1  tablet by mouth daily.    Yes [provider]  pantoprazole (PROTONIX) 40 MG tablet TAKE 1 TABLET BY MOUTH EVERY DAY 12/25/21  Yes Byrum, Rose Fillers, MD  Probiotic Product (PROBIOTIC DAILY PO) Take 1 tablet by mouth daily.    Yes [provider]  rosuvastatin (CRESTOR) 40 MG tablet Take 1 tablet (40 mg total) by mouth daily. 12/25/20 01/01/22 Yes Skeet Latch, MD  sodium chloride (OCEAN) 0.65 % SOLN nasal spray Place 1 spray into both nostrils as needed for congestion.   Yes [provider]  acetaminophen (TYLENOL) 500 MG tablet Take 500 mg by mouth every 6 (six) hours as needed for moderate pain.    [provider]  docusate sodium (COLACE) 100 MG capsule Take 100 mg by mouth daily.     [provider]  metoprolol tartrate (LOPRESSOR) 100 MG tablet Take 1 tablet (100 mg total) by mouth once for 1 dose. Take 1-2 hours prior to CT scan. 10/29/21  10/29/21  Deboraha Sprang, MD    Inpatient Medications: Scheduled Meds:  aspirin EC  81 mg Oral Daily   Budeson-Glycopyrrol-Formoterol  2 puff Inhalation BID   enoxaparin (LOVENOX) injection  40 mg Subcutaneous Q24H   [START ON 01/02/2022] fluticasone  2 spray Each Nare Daily   [START ON 01/02/2022] pantoprazole  40 mg Oral Daily   rosuvastatin  40 mg Oral Daily   sodium chloride flush  3 mL Intravenous Q12H   Continuous Infusions:  lactated ringers     PRN Meds: acetaminophen **OR** acetaminophen, albuterol, hydrALAZINE, ondansetron **OR** ondansetron (ZOFRAN) IV, mouth rinse  Allergies:    Allergies  Allergen Reactions   Keflex [Cephalexin] Rash   Penicillins Itching   Sulfa Antibiotics Itching    Social History:   Social History   Socioeconomic History   Marital status: Married    Spouse name: Not on file   Number of children: Not on file   Years of education: Not on file   Highest education level: Not on file  Occupational History   Occupation: retired  Tobacco Use   Smoking status: Never    Passive exposure: Past   Smokeless tobacco: Never  Vaping Use   Vaping Use: Never used  Substance and Sexual Activity   Alcohol use: Yes    Comment: once in a while   Drug use: Never   Sexual activity: Not Currently  Other Topics Concern   Not on file  Social History Narrative   Not on file   Social Determinants of Health   Financial Resource Strain: Low Risk  (02/13/2021)   Overall Financial Resource Strain (CARDIA)    Difficulty of Paying Living Expenses: Not hard at all  Food Insecurity: No Food Insecurity (01/01/2022)   Hunger Vital Sign    Worried About Running Out of Food in the Last Year: Never true    [place] in the Last Year: Never true  Transportation Needs: No Transportation Needs (01/01/2022)   PRAPARE - Hydrologist (Medical): No    Lack of Transportation (Non-Medical): No  Physical Activity: Insufficiently  Active (02/13/2021)   Exercise Vital Sign    Days of Exercise per Week: 2 days    Minutes of Exercise per Session: 20 min  Stress: No Stress Concern Present (02/13/2021)   [place]    Feeling of Stress : Not at all  Social Connections: Moderately Isolated (02/13/2021)  Social Licensed conveyancer [NHANES]    Frequency of Communication with Friends and Family: Twice a week    Frequency of Social Gatherings with Friends and Family: Twice a week    Attends Religious Services: Never    Marine scientist or Organizations: No    Attends Archivist Meetings: Never    Marital Status: Married  Human resources officer Violence: Not At Risk (01/01/2022)   Humiliation, Afraid, Rape, and Kick questionnaire    Fear of Current or Ex-Partner: No    Emotionally Abused: No    Physically Abused: No    Sexually Abused: No    Family History:    Family History  Problem Relation Age of Onset   Arthritis Mother    Cancer Mother    Depression Mother    Heart attack Mother    Hyperlipidemia Mother    Hypertension Mother    Alcohol abuse Father    Heart disease Father    Hypertension Father    Hyperlipidemia Father    Heart disease Sister    Stroke Sister    Alcohol abuse Brother    Heart disease Brother    Hypertension Brother    Hyperlipidemia Brother    Multiple sclerosis Daughter    Hyperlipidemia Son    Heart disease Maternal Grandfather    Heart disease Paternal Grandfather    Hyperlipidemia Sister    Alcohol abuse Sister    Hyperlipidemia Sister    Hypertension Sister    Alcohol abuse Brother    Early death Brother    Heart attack Brother    Heart disease Brother    Hyperlipidemia Brother      ROS:  Please see the history of present illness.   All other ROS reviewed and negative.     Physical Exam/Data:   Vitals:   01/01/22 0815 01/01/22 0900 01/01/22 1129 01/01/22 1259  BP: (!) 162/71  (!) 146/67 (!) 143/69 (!) 145/73  Pulse: 76 86 80 80  Resp: (!) '24 17 15 19  '$ Temp:   98.7 F (37.1 C) 98.9 F (37.2 C)  TempSrc:   Oral Oral  SpO2: 99% 97% 97% 97%  Weight:    62.6 kg  Height:    '5\' 2"'$  (1.575 m)    Intake/Output Summary (Last 24 hours) at 01/01/2022 1453 Last data filed at 01/01/2022 1130 Gross per 24 hour  Intake 1965.16 ml  Output 1625 ml  Net 340.16 ml      01/01/2022   12:59 PM 12/31/2021   11:08 AM 11/13/2021   10:18 AM  Last 3 Weights  Weight (lbs) 138 lb 138 lb 144 lb 6.4 oz  Weight (kg) 62.596 kg 62.596 kg 65.499 kg     Body mass index is 25.24 kg/m.  General:  Well nourished, well developed, in no acute distress. Laying flat in the bed  HEENT: normal Neck: no JVD Vascular: No carotid bruits; Radial pulses 2+ bilaterally Cardiac:  normal S1, S2; RRR; Grade 3/6 systolic murmur throughout pericardium  Lungs:  expiratory wheezes and fine crackles heard throughout  Abd: soft, nontender, no hepatomegaly  Ext: no edema Musculoskeletal:  No deformities, BUE and BLE strength normal and equal Skin: warm and dry  Neuro:  CNs 2-12 intact, no focal abnormalities noted Psych:  Normal affect   EKG:  The EKG was personally reviewed and demonstrates:  normal sinus rhythm, HR 61 BPM, questionable early repolarization Telemetry:  Telemetry was personally reviewed and demonstrates: Normal sinus rhythm  Relevant CV Studies:   Laboratory Data:  High Sensitivity Troponin:   Recent Labs  Lab 12/31/21 1106 12/31/21 1415  TROPONINIHS 4 3     Chemistry Recent Labs  Lab 12/31/21 1106  NA 138  K 3.2*  CL 97*  CO2 26  GLUCOSE 140*  BUN 21  CREATININE 1.44*  CALCIUM 10.1  GFRNONAA 38*  ANIONGAP 15    Recent Labs  Lab 12/31/21 1106  PROT 7.6  ALBUMIN 4.6  AST 21  ALT 16  ALKPHOS 79  BILITOT 0.5   Lipids No results for input(s): "CHOL", "TRIG", "HDL", "LABVLDL", "LDLCALC", "CHOLHDL" in the last 168 hours.  Hematology Recent Labs  Lab  12/31/21 1106  WBC 10.9*  RBC 4.88  HGB 14.7  HCT 43.7  MCV 89.5  MCH 30.1  MCHC 33.6  RDW 13.2  PLT 321   Thyroid No results for input(s): "TSH", "FREET4" in the last 168 hours.  BNPNo results for input(s): "BNP", "PROBNP" in the last 168 hours.  DDimer No results for input(s): "DDIMER" in the last 168 hours.   Radiology/Studies:  DG Chest Port 1 View  Result Date: 12/31/2021 CLINICAL DATA:  Syncope, history of sarcoidosis EXAM: PORTABLE CHEST 1 VIEW COMPARISON:  02/05/2021 FINDINGS: Normal cardiac and mediastinal contours. Aortic atherosclerosis. Redemonstrated bilateral upper lobe scarring and volume loss with superior retraction of the left hila. No new focal pulmonary opacity. No pleural effusion or pneumothorax. No acute osseous abnormality. IMPRESSION: Redemonstrated bilateral upper lobe scarring and volume loss with superior retraction of the left hila, consistent with the patient's history of sarcoidosis. No new focal pulmonary opacity. Electronically Signed   By: Merilyn Baba M.D.   On: 12/31/2021 11:43   CT Head Wo Contrast  Result Date: 12/31/2021 CLINICAL DATA:  Syncope EXAM: CT HEAD WITHOUT CONTRAST TECHNIQUE: Contiguous axial images were obtained from the base of the skull through the vertex without intravenous contrast. RADIATION DOSE REDUCTION: This exam was performed according to the departmental dose-optimization program which includes automated exposure control, adjustment of the mA and/or kV according to patient size and/or use of iterative reconstruction technique. COMPARISON:  CT head dated 11/16/2018 FINDINGS: Brain: No evidence of acute infarction, hemorrhage, hydrocephalus, extra-axial collection or mass lesion/mass effect. Vascular: No hyperdense vessel or unexpected calcification. Skull: Similar appearance of right parietal hemangioma. No acute fracture or dislocation. Sinuses/Orbits: Bilateral pseudophakia. Right maxillary mucous retention cyst. The sinuses and  mastoid air cells are otherwise clear. Other: None. IMPRESSION: 1. No acute intracranial or large territorial infarction. 2. No acute fracture or dislocation. Electronically Signed   By: Darrin Nipper M.D.   On: 12/31/2021 11:42     Assessment and Plan:   CAD  -Patient had a coronary CT scan on 9/5 that showed hemodynamically significant stenoses in the mid LAD and mid RCA.  Recommended cardiac catheterization for further evaluation -She has an outpatient appointment on 10/30 to arrange cardiac cath.  However, given patient's complaints of recurrent syncope, dyspnea on exertion, abnormal Coronary CT, we should arrange cardiac catheterization this admission - Note that patient only has one functional kidney due to a chronically occluded left renal artery. Creatinine 1.44 on presentation--baseline appears to be around 1.2. Hold nephrotoxic medications for now to preserve renal function prior to cath. Patient has also been given IV fluids, we will recheck BMP in the morning    Syncope  -Patient has had a longstanding history of recurrent syncope.  However, she believes that this has been getting worse over time. -  Patient previous had a loop recorder, did not have any episodes of syncope during the 3 years that the battery was good.  Battery has since died -She saw Dr. Caryl Comes in 11/24/21.  He thought that syncope could be neurally mediated in the setting of the known pulmonary sarcoidosis.  However, he recommended coronary CT scan that was abnormal as above  -Most recent echocardiogram was completed 02/2021.  Showed EF 60-65%, no regional wall motion abnormalities, grade 1 diastolic dysfunction, normal RV systolic function -Carotid duplex scans on 02/2021 showed that bilateral carotid arteries were near normal with only minimal thickening/plaque - Update echocardiogram  -Plan for cardiac catheterization this admission -Consider monitor at discharge  Hypertension - BP was low normal in the ED. However, BP  now increased to SBP 140s-160s  - Resume home amlodipine 10 mg daily   Renal artery stenosis  - Patient had stenting of the right renal artery in 11/2020. Also has a chronically occluded left renal artery  - Followed as an outpatient   Sarcoidosis -Continue home regimen of albuterol, Breztri   Risk Assessment/Risk Scores:                For questions or updates, please contact [place] Please consult [email or web address] for contact info under    Signed, Margie Billet, PA-C  01/01/2022 2:53 PM

## 2022-01-01 NOTE — ED Notes (Signed)
Patient given albuterol neb. Patient takes albuterol QD at home. Albuterol QD and albuterol Q4 PRN ordered. RT assessment completed at time of treatment. Patient awaiting transfer to [facility]. Care ongoing.

## 2022-01-01 NOTE — Telephone Encounter (Signed)
Pt is scheduled to see Kristen Suarez 01/14/2022 to discuss cath.  Pt is currently inpatient at [facility] ([place]).

## 2022-01-01 NOTE — ED Notes (Signed)
Reviewed pt home medication with pt and EDP notified as admitting has not ordered home meds.  Also pt is asking about IV fluids.  Await orders

## 2022-01-02 ENCOUNTER — Encounter (HOSPITAL_COMMUNITY): Payer: Self-pay | Admitting: Cardiovascular Disease

## 2022-01-02 ENCOUNTER — Encounter (HOSPITAL_COMMUNITY)
Admission: EM | Disposition: A | Discharge status: Home Health | Payer: Self-pay | Source: Home / Self Care | Attending: Cardiothoracic Surgery

## 2022-01-02 DIAGNOSIS — I2510 Atherosclerotic heart disease of native coronary artery without angina pectoris: Secondary | ICD-10-CM

## 2022-01-02 DIAGNOSIS — I701 Atherosclerosis of renal artery: Secondary | ICD-10-CM | POA: Diagnosis not present

## 2022-01-02 DIAGNOSIS — I2584 Coronary atherosclerosis due to calcified coronary lesion: Secondary | ICD-10-CM

## 2022-01-02 DIAGNOSIS — N1832 Chronic kidney disease, stage 3b: Secondary | ICD-10-CM | POA: Diagnosis not present

## 2022-01-02 DIAGNOSIS — R931 Abnormal findings on diagnostic imaging of heart and coronary circulation: Secondary | ICD-10-CM

## 2022-01-02 DIAGNOSIS — R55 Syncope and collapse: Secondary | ICD-10-CM | POA: Diagnosis not present

## 2022-01-02 DIAGNOSIS — N189 Chronic kidney disease, unspecified: Secondary | ICD-10-CM

## 2022-01-02 HISTORY — PX: LEFT HEART CATH AND CORONARY ANGIOGRAPHY: CATH118249

## 2022-01-02 LAB — CBC
HCT: 38.7 % (ref 36.0–46.0)
Hemoglobin: 13.1 g/dL (ref 12.0–15.0)
MCH: 30.7 pg (ref 26.0–34.0)
MCHC: 33.9 g/dL (ref 30.0–36.0)
MCV: 90.6 fL (ref 80.0–100.0)
Platelets: 218 10*3/uL (ref 150–400)
RBC: 4.27 MIL/uL (ref 3.87–5.11)
RDW: 13.2 % (ref 11.5–15.5)
WBC: 6.3 10*3/uL (ref 4.0–10.5)
nRBC: 0 % (ref 0.0–0.2)

## 2022-01-02 LAB — BASIC METABOLIC PANEL
Anion gap: 12 (ref 5–15)
BUN: 8 mg/dL (ref 8–23)
CO2: 24 mmol/L (ref 22–32)
Calcium: 9.8 mg/dL (ref 8.9–10.3)
Chloride: 104 mmol/L (ref 98–111)
Creatinine, Ser: 0.97 mg/dL (ref 0.44–1.00)
GFR, Estimated: >60 mL/min (ref >60)
Glucose, Bld: 96 mg/dL (ref 70–99)
Potassium: 3.4 mmol/L — ABNORMAL LOW (ref 3.5–5.1)
Sodium: 140 mmol/L (ref 135–145)

## 2022-01-02 SURGERY — LEFT HEART CATH AND CORONARY ANGIOGRAPHY
Anesthesia: LOCAL

## 2022-01-02 MED ORDER — SODIUM CHLORIDE 0.9% FLUSH: 3.0000 mL | INTRAVENOUS | Status: DC | PRN

## 2022-01-02 MED ORDER — HYDRALAZINE HCL 20 MG/ML IJ SOLN: 10.0000 mg | INTRAMUSCULAR | Status: DC | PRN

## 2022-01-02 MED ORDER — HEPARIN (PORCINE) IN NACL 1000-0.9 UT/500ML-% IV SOLN
INTRAVENOUS | Status: DC | PRN
  Administered 2022-01-02 (×2): 500 mL

## 2022-01-02 MED ORDER — SODIUM CHLORIDE 0.9 % IV SOLN: INTRAVENOUS | Status: DC

## 2022-01-02 MED ORDER — FENTANYL CITRATE (PF) 100 MCG/2ML IJ SOLN
INTRAMUSCULAR | Status: DC | PRN
  Administered 2022-01-02: 25 ug via INTRAVENOUS

## 2022-01-02 MED ORDER — MIDAZOLAM HCL 2 MG/2ML IJ SOLN
INTRAMUSCULAR | Status: AC
  Filled 2022-01-02: qty 2

## 2022-01-02 MED ORDER — ASPIRIN 81 MG PO CHEW: 81.0000 mg | CHEWABLE_TABLET | Freq: Every day | ORAL | Status: DC

## 2022-01-02 MED ORDER — HEPARIN SODIUM (PORCINE) 1000 UNIT/ML IJ SOLN
INTRAMUSCULAR | Status: DC | PRN
  Administered 2022-01-02: 3100 [IU] via INTRAVENOUS

## 2022-01-02 MED ORDER — LIDOCAINE HCL (PF) 1 % IJ SOLN
INTRAMUSCULAR | Status: AC
  Filled 2022-01-02: qty 30

## 2022-01-02 MED ORDER — ONDANSETRON HCL 4 MG/2ML IJ SOLN: 4.0000 mg | Freq: Four times a day (QID) | INTRAMUSCULAR | Status: DC | PRN

## 2022-01-02 MED ORDER — ENOXAPARIN SODIUM 40 MG/0.4ML IJ SOSY
40.0000 mg | PREFILLED_SYRINGE | INTRAMUSCULAR | Status: DC
  Administered 2022-01-03: 40 mg via SUBCUTANEOUS
  Filled 2022-01-02: qty 0.4

## 2022-01-02 MED ORDER — POTASSIUM CHLORIDE 10 MEQ/100ML IV SOLN
10.0000 meq | INTRAVENOUS | Status: AC
  Administered 2022-01-02 (×3): 10 meq via INTRAVENOUS
  Filled 2022-01-02 (×3): qty 100

## 2022-01-02 MED ORDER — HEPARIN (PORCINE) IN NACL 1000-0.9 UT/500ML-% IV SOLN
INTRAVENOUS | Status: AC
  Filled 2022-01-02: qty 1000

## 2022-01-02 MED ORDER — IOHEXOL 350 MG/ML SOLN
INTRAVENOUS | Status: DC | PRN
  Administered 2022-01-02: 55 mL

## 2022-01-02 MED ORDER — SODIUM CHLORIDE 0.9 % IV SOLN: 250.0000 mL | INTRAVENOUS | Status: DC | PRN

## 2022-01-02 MED ORDER — FENTANYL CITRATE (PF) 100 MCG/2ML IJ SOLN
INTRAMUSCULAR | Status: AC
  Filled 2022-01-02: qty 2

## 2022-01-02 MED ORDER — LABETALOL HCL 5 MG/ML IV SOLN: 10.0000 mg | INTRAVENOUS | Status: DC | PRN

## 2022-01-02 MED ORDER — HEPARIN SODIUM (PORCINE) 1000 UNIT/ML IJ SOLN
INTRAMUSCULAR | Status: AC
  Filled 2022-01-02: qty 10

## 2022-01-02 MED ORDER — SODIUM CHLORIDE 0.9% FLUSH
3.0000 mL | Freq: Two times a day (BID) | INTRAVENOUS | Status: DC
  Administered 2022-01-02 – 2022-01-03 (×3): 3 mL via INTRAVENOUS

## 2022-01-02 MED ORDER — ACETAMINOPHEN 325 MG PO TABS: 650.0000 mg | ORAL_TABLET | ORAL | Status: DC | PRN

## 2022-01-02 MED ORDER — VERAPAMIL HCL 2.5 MG/ML IV SOLN
INTRAVENOUS | Status: DC | PRN
  Administered 2022-01-02: 10 mL via INTRA_ARTERIAL

## 2022-01-02 MED ORDER — VERAPAMIL HCL 2.5 MG/ML IV SOLN
INTRAVENOUS | Status: AC
  Filled 2022-01-02: qty 2

## 2022-01-02 MED ORDER — LIDOCAINE HCL (PF) 1 % IJ SOLN
INTRAMUSCULAR | Status: DC | PRN
  Administered 2022-01-02: 2 mL via INTRADERMAL

## 2022-01-02 MED ORDER — MIDAZOLAM HCL 2 MG/2ML IJ SOLN
INTRAMUSCULAR | Status: DC | PRN
  Administered 2022-01-02: 1 mg via INTRAVENOUS

## 2022-01-02 SURGICAL SUPPLY — 9 items
BAND ZEPHYR COMPRESS 30 LONG (HEMOSTASIS) IMPLANT
CATH OPTITORQUE TIG 4.0 5F (CATHETERS) IMPLANT
GLIDESHEATH SLEND SS 6F .021 (SHEATH) IMPLANT
GUIDEWIRE INQWIRE 1.5J.035X260 (WIRE) IMPLANT
INQWIRE 1.5J .035X260CM (WIRE) ×1
KIT HEART LEFT (KITS) ×1 IMPLANT
PACK CARDIAC CATHETERIZATION (CUSTOM PROCEDURE TRAY) ×1 IMPLANT
TRANSDUCER W/STOPCOCK (MISCELLANEOUS) ×1 IMPLANT
TUBING CIL FLEX 10 FLL-RA (TUBING) ×1 IMPLANT

## 2022-01-02 NOTE — Progress Notes (Addendum)
Rounding Note    Patient Name: Kristen Suarez Date of Encounter: 01/02/2022  [place] Cardiologist: Skeet Latch, MD   Subjective   Patient feels tired this AM, got OK sleep last night. She is scheduled for cath today, all questions about the procedure were answered. Denies chest pain, palpitations, sob, syncope/near syncope.   Inpatient Medications    Scheduled Meds:  amLODipine  10 mg Oral Daily   aspirin EC  81 mg Oral Daily   enoxaparin (LOVENOX) injection  30 mg Subcutaneous Q24H   fluticasone  2 spray Each Nare Daily   mometasone-formoterol  2 puff Inhalation BID   pantoprazole  40 mg Oral Daily   rosuvastatin  40 mg Oral Daily   sodium chloride flush  3 mL Intravenous Q12H   sodium chloride flush  3 mL Intravenous Q12H   umeclidinium bromide  1 puff Inhalation Daily   Continuous Infusions:  sodium chloride     sodium chloride 1 mL/kg/hr (01/02/22 0700)   lactated ringers 62.6 mL/hr at 01/02/22 0700   potassium chloride 10 mEq (01/02/22 0817)   PRN Meds: sodium chloride, acetaminophen **OR** acetaminophen, albuterol, hydrALAZINE, ondansetron **OR** ondansetron (ZOFRAN) IV, mouth rinse, sodium chloride flush   Vital Signs    Vitals:   01/02/22 0306 01/02/22 0600 01/02/22 0712 01/02/22 0800  BP: (!) 145/75   (!) 144/74  Pulse: 74   79  Resp: 16   16  Temp: 98.4 F (36.9 C)   98.2 F (36.8 C)  TempSrc: Oral   Oral  SpO2: 92%   97%  Weight:  62.9 kg 62.5 kg   Height:        Intake/Output Summary (Last 24 hours) at 01/02/2022 0907 Last data filed at 01/02/2022 0759 Gross per 24 hour  Intake 2993.1 ml  Output 1625 ml  Net 1368.1 ml      01/02/2022    7:12 AM 01/02/2022    6:00 AM 01/01/2022   12:59 PM  Last 3 Weights  Weight (lbs) 137 lb 12.6 oz 138 lb 10.7 oz 138 lb  Weight (kg) 62.5 kg 62.9 kg 62.596 kg      Telemetry     Normal Sinus rhythm- Personally Reviewed  ECG    No new tracings since 10/16 - Personally  Reviewed  Physical Exam   GEN: No acute distress. Laying in the bed with head elevated    Neck: No JVD Cardiac: RRR, grade 2/6 systolic ejection murmur. Radial pulses 2+ bilaterally  Respiratory: Occasional expiratory wheezes that cleared with cough. Otherwise clear to ausculation bilaterally  GI: Soft, nontender, non-distended  MS: No edema; No deformity. Neuro:  Nonfocal  Psych: Normal affect   Labs    High Sensitivity Troponin:   Recent Labs  Lab 12/31/21 1106 12/31/21 1415  TROPONINIHS 4 3     Chemistry Recent Labs  Lab 12/31/21 1106 01/02/22 0338  NA 138 140  K 3.2* 3.4*  CL 97* 104  CO2 26 24  GLUCOSE 140* 96  BUN 21 8  CREATININE 1.44* 0.97  CALCIUM 10.1 9.8  PROT 7.6  --   ALBUMIN 4.6  --   AST 21  --   ALT 16  --   ALKPHOS 79  --   BILITOT 0.5  --   GFRNONAA 38* >60  ANIONGAP 15 12    Lipids No results for input(s): "CHOL", "TRIG", "HDL", "LABVLDL", "LDLCALC", "CHOLHDL" in the last 168 hours.  Hematology Recent Labs  Lab 12/31/21 1106  01/02/22 0338  WBC 10.9* 6.3  RBC 4.88 4.27  HGB 14.7 13.1  HCT 43.7 38.7  MCV 89.5 90.6  MCH 30.1 30.7  MCHC 33.6 33.9  RDW 13.2 13.2  PLT 321 218   Thyroid No results for input(s): "TSH", "FREET4" in the last 168 hours.  BNPNo results for input(s): "BNP", "PROBNP" in the last 168 hours.  DDimer No results for input(s): "DDIMER" in the last 168 hours.   Radiology    ECHOCARDIOGRAM COMPLETE  Result Date: 01/01/2022    ECHOCARDIOGRAM REPORT   Patient Name:   Kristen Suarez Date of Exam: 01/01/2022 Medical Rec #:  [id number]         Height:       62.0 in Accession #:    [id number]        Weight:       138.0 lb Date of Birth:  1945-06-11         BSA:          1.633 m Patient Age:    76 years          BP:           153/97 mmHg Patient Gender: F                 HR:           82 bpm. Exam Location:  Inpatient Procedure: 2D Echo, Color Doppler and Cardiac Doppler Indications:    Stroke i63.9  History:         Patient has prior history of Echocardiogram examinations, most                 recent 02/16/2021. CAD, COPD; Risk Factors:Hypertension and                 Dyslipidemia.  Sonographer:    Raquel Sarna Senior RDCS Referring Phys: [id number] [place]  1. Left ventricular ejection fraction, by estimation, is >75%. The left ventricle has hyperdynamic function. The left ventricle has no regional wall motion abnormalities. Left ventricular diastolic parameters are consistent with Grade I diastolic dysfunction (impaired relaxation).  2. Right ventricular systolic function is mildly reduced. The right ventricular size is normal.  3. The mitral valve is abnormal. Trivial mitral valve regurgitation.  4. The aortic valve is tricuspid. Aortic valve regurgitation is not visualized.  5. The inferior vena cava is normal in size with greater than 50% respiratory variability, suggesting right atrial pressure of 3 mmHg.  6. Cannot exclude a small PFO. Comparison(s): Changes from prior study are noted. 02/16/2021: LVEF 60-65%. FINDINGS  Left Ventricle: Left ventricular ejection fraction, by estimation, is >75%. The left ventricle has hyperdynamic function. The left ventricle has no regional wall motion abnormalities. The left ventricular internal cavity size was normal in size. There is no left ventricular hypertrophy. Left ventricular diastolic parameters are consistent with Grade I diastolic dysfunction (impaired relaxation). Indeterminate filling pressures. Right Ventricle: The right ventricular size is normal. No increase in right ventricular wall thickness. Right ventricular systolic function is mildly reduced. Left Atrium: Left atrial size was normal in size. Right Atrium: Right atrial size was normal in size. Pericardium: There is no evidence of pericardial effusion. Mitral Valve: The mitral valve is abnormal. There is mild calcification of the mitral valve leaflet(s). Trivial mitral valve regurgitation. Tricuspid  Valve: The tricuspid valve is grossly normal. Tricuspid valve regurgitation is trivial. Aortic Valve: The aortic valve is tricuspid. Aortic valve regurgitation is not visualized. Pulmonic Valve:  The pulmonic valve was normal in structure. Pulmonic valve regurgitation is not visualized. Aorta: The aortic root and ascending aorta are structurally normal, with no evidence of dilitation. Venous: The inferior vena cava is normal in size with greater than 50% respiratory variability, suggesting right atrial pressure of 3 mmHg. IAS/Shunts: Cannot exclude a small PFO.  LEFT VENTRICLE PLAX 2D LVIDd:         4.45 cm   Diastology LVIDs:         2.10 cm   LV e' medial:    6.42 cm/s LV PW:         0.75 cm   LV E/e' medial:  10.2 LV IVS:        0.70 cm   LV e' lateral:   6.20 cm/s LVOT diam:     2.00 cm   LV E/e' lateral: 10.6 LV SV:         85 LV SV Index:   52 LVOT Area:     3.14 cm  RIGHT VENTRICLE RV S prime:     8.16 cm/s TAPSE (M-mode): 1.8 cm LEFT ATRIUM             Index        RIGHT ATRIUM           Index LA diam:        3.00 cm 1.84 cm/m   RA Area:     10.30 cm LA Vol (A2C):   28.4 ml 17.39 ml/m  RA Volume:   19.10 ml  11.70 ml/m LA Vol (A4C):   32.0 ml 19.60 ml/m LA Biplane Vol: 32.5 ml 19.90 ml/m  AORTIC VALVE LVOT Vmax:   132.00 cm/s LVOT Vmean:  97.800 cm/s LVOT VTI:    0.272 m  AORTA Ao Root diam: 2.50 cm Ao Asc diam:  3.50 cm MITRAL VALVE MV Area (PHT): 2.27 cm    SHUNTS MV Decel Time: 334 msec    Systemic VTI:  0.27 m MV E velocity: 65.70 cm/s  Systemic Diam: 2.00 cm MV A velocity: 85.40 cm/s MV E/A ratio:  0.77 Lyman Bishop MD Electronically signed by Lyman Bishop MD Signature Date/Time: 01/01/2022/7:16:14 PM    Final    DG Chest Port 1 View  Result Date: 12/31/2021 CLINICAL DATA:  Syncope, history of sarcoidosis EXAM: PORTABLE CHEST 1 VIEW COMPARISON:  02/05/2021 FINDINGS: Normal cardiac and mediastinal contours. Aortic atherosclerosis. Redemonstrated bilateral upper lobe scarring and volume loss  with superior retraction of the left hila. No new focal pulmonary opacity. No pleural effusion or pneumothorax. No acute osseous abnormality. IMPRESSION: Redemonstrated bilateral upper lobe scarring and volume loss with superior retraction of the left hila, consistent with the patient's history of sarcoidosis. No new focal pulmonary opacity. Electronically Signed   By: Merilyn Baba M.D.   On: 12/31/2021 11:43   CT Head Wo Contrast  Result Date: 12/31/2021 CLINICAL DATA:  Syncope EXAM: CT HEAD WITHOUT CONTRAST TECHNIQUE: Contiguous axial images were obtained from the base of the skull through the vertex without intravenous contrast. RADIATION DOSE REDUCTION: This exam was performed according to the departmental dose-optimization program which includes automated exposure control, adjustment of the mA and/or kV according to patient size and/or use of iterative reconstruction technique. COMPARISON:  CT head dated 11/16/2018 FINDINGS: Brain: No evidence of acute infarction, hemorrhage, hydrocephalus, extra-axial collection or mass lesion/mass effect. Vascular: No hyperdense vessel or unexpected calcification. Skull: Similar appearance of right parietal hemangioma. No acute fracture or dislocation. Sinuses/Orbits: Bilateral pseudophakia. Right  maxillary mucous retention cyst. The sinuses and mastoid air cells are otherwise clear. Other: None. IMPRESSION: 1. No acute intracranial or large territorial infarction. 2. No acute fracture or dislocation. Electronically Signed   By: Darrin Nipper M.D.   On: 12/31/2021 11:42    Cardiac Studies   Echocardiogram 01/01/22 1. Left ventricular ejection fraction, by estimation, is >75%. The left  ventricle has hyperdynamic function. The left ventricle has no regional  wall motion abnormalities. Left ventricular diastolic parameters are  consistent with Grade I diastolic  dysfunction (impaired relaxation).   2. Right ventricular systolic function is mildly reduced. The right   ventricular size is normal.   3. The mitral valve is abnormal. Trivial mitral valve regurgitation.   4. The aortic valve is tricuspid. Aortic valve regurgitation is not  visualized.   5. The inferior vena cava is normal in size with greater than 50%  respiratory variability, suggesting right atrial pressure of 3 mmHg.   6. Cannot exclude a small PFO.   Comparison(s): Changes from prior study are noted. 02/16/2021: LVEF 60-65%.   Patient Profile     76 y.o. female with a hx of HTN, sarcoidosis, renal artery stenosis, coronary calcification on CT who is being seen for the evaluation of syncope, abnormal coronary CT at the request of Dr. Lorin Mercy  Assessment & Plan    CAD  -Patient had a coronary CT scan on 9/5 that showed hemodynamically significant stenoses in the mid LAD and mid RCA.  Recommended cardiac catheterization for further evaluation -Patient is scheduled to undergo cardiac catheterization later today. 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 Renal function has improved with IV fluids. Will need a BMP tomorrow AM to check renal function after receiving contrast dye - Continue asa, crestor   Syncope  -Patient has had a longstanding history of recurrent syncope.  However, she believes that this has been getting worse over time. -Patient previous had a loop recorder, did not have any episodes of syncope during the 3 years that the battery was good.  Battery has since died -She saw Dr. Caryl Comes in 11/14/2021.  He thought that syncope could be neurally mediated in the setting of the known pulmonary sarcoidosis.  However, he recommended coronary CT scan that was abnormal as above  - Echocardiogram this admission showed EF >75% with hyperdynamic function, no regional wall motion abnormalities, grade I diastolic dysfunction, mildly reduced RV systolic function. Patient  does have a systolic murmur on exam, suspect this is due to hyperdynamic LV. No evidence of outflow tract obstruction or HOCM.  -Carotid duplex scans on 02/2021 showed that bilateral carotid arteries were near normal with only minimal thickening/plaque -Plan for cardiac catheterization today  -Consider monitor at discharge   Hypertension - BP was low normal in the ED. However, BP now increased to SBP 140s-160s  - Continue home amlodipine 10 mg daily  - Resume home valsartan after cath. Start BB after cath    Renal artery stenosis  - Patient had stenting of the right renal artery in 11/2020. Also has a chronically occluded left renal artery  - Followed as an outpatient    Sarcoidosis -Continue home regimen of albuterol, Breztri    For questions or updates, please contact [place] Please consult [email or web address] for contact info under        Signed, Margie Billet, PA-C  01/02/2022, 9:07 AM

## 2022-01-02 NOTE — Interval H&P Note (Signed)
Cath Lab Visit (complete for each Cath Lab visit)  Clinical Evaluation Leading to the Procedure:   ACS: No.  Non-ACS:    Anginal Classification: CCS I  Anti-ischemic medical therapy: Maximal Therapy (2 or more classes of medications)  Non-Invasive Test Results: Intermediate-risk stress test findings: cardiac mortality 1-3%/year  Prior CABG: No previous CABG      History and Physical Interval Note:  01/02/2022 9:59 AM  Kristen Suarez  has presented today for surgery, with the diagnosis of abnormal cardiac CT.  The various methods of treatment have been discussed with the patient and family. After consideration of risks, benefits and other options for treatment, the patient has consented to  Procedure(s): LEFT HEART CATH AND CORONARY ANGIOGRAPHY (N/A) as a surgical intervention.  The patient's history has been reviewed, patient examined, no change in status, stable for surgery.  I have reviewed the patient's chart and labs.  Questions were answered to the patient's satisfaction.     Shelva Majestic

## 2022-01-02 NOTE — Progress Notes (Addendum)
PROGRESS NOTE   Kristen Suarez  IRJ:[id number]    DOB: 1945-12-24    DOA: 12/31/2021  PCP: Flossie Buffy, NP   I have briefly reviewed patients previous medical records in [facility].  Chief Complaint  Patient presents with   Near Syncope   Emesis    Brief Narrative:  77 year old female with medical history significant for CAD, HTN, HLD, sarcoidosis, longstanding history of recurrent syncope (approximately 20 years), presented with recurrent syncope.  Recent outpatient coronary CTA was abnormal, cardiology consulted and plan for cardiac cath on 10/18 at 10 AM.   Assessment & Plan:  Principal Problem:   Syncope Active Problems:   Sarcoidosis   HTN (hypertension)   Hyperlipidemia   Renal artery stenosis (HCC)   Chronic kidney disease, stage 3b (HCC)   Recurrent syncope: Patient reports longstanding history of syncope, approximately 20+ years, possibly getting worse over time.  She previously had a loop recorder but did not have any episode during the 3 years the battery was good and has had 2 episodes since the battery died.  She has seen Dr. Virl Axe, EP cardiology for this and he felt that this was neurally mediated in the context of her sarcoidosis.  Due to history of DOE, strong family history of CAD, underwent outpatient coronary CTA which was abnormal.  She was supposed to have cardiac cath end of this month but since she presented with recurrent syncope, cardiology consulted and plan cardiac cath on 10/18 at 10 AM to rule out cardiogenic etiology.  Interestingly despite having recurrent syncope, patient reports that she continues to drive and no one thus far has advised her regarding driving restrictions.  Counseled her regarding importance of no driving for 6 months and she verbalized understanding.  TTE: LVEF >75% with hyperdynamic function.  Grade 1 diastolic dysfunction.  CAD -Coronary CTA showed severe stenosis with lesions in the mLAD and mRCA concerning  for 70-99% stenosis -She was planned for outpatient cardiac catheterization but this will now be performed during this hospitalization on 10/18 at 10 AM and -Cardiology consult follow-up appreciated.  Addendum: Cardiac cath results: Severely calcified coronary arteries with multivessel CAD.  Cardiology has consulted thoracic surgery for CABG.  Await thoracic surgeons input.  If plan for CABG this admission, can be changed to inpatient.   Sarcoidosis -Continue Breztri, Albuterol.  Chest x-ray with scarring abnormalities.   HTN -Hold amlodipine/HCTZ for now -Hold [name] for now -Will cover with prn IV hydralazine if needed - Reasonably controlled.  Resume valsartan, beta-blockers and amlodipine HCTZ post-cath.  Hypokalemia Replaced IV.   HLD -Continue rosuvastatin   RAS -s/p stenting of the R renal artery with chronically occluded L renal artery -She is followed by Dr. Fletcher Anon and is due for repeat imaging in 06/2022   Acute kidney injury complicating stage 3b CKD -Appears slightly worse than prior, but this may be related to progression of CKD and/or RAS -For now, will follow -Attempt to avoid nephrotoxic medications -Presented with creatinine of 1.44 which has normalized to 0.97.     Body mass index is 25.2 kg/m.   DVT prophylaxis: enoxaparin (LOVENOX) injection 30 mg Start: 01/01/22 1800     Code Status: Full Code:  Family Communication: None at bedside Disposition:  Status is: Observation The patient remains OBS appropriate and will d/c before 2 midnights.     Consultants:   Cardiology  Procedures:     Antimicrobials:      Subjective:  Seen this morning prior  to procedure.  History as noted above.  No further syncope since hospitalization.  Has been up and going to the bathroom almost every 2 hours per her report.  No chest pain, palpitations, dizziness or lightheadedness.  Objective:   Vitals:   01/02/22 0306 01/02/22 0600 01/02/22 0712 01/02/22 0800   BP: (!) 145/75   (!) 144/74  Pulse: 74   79  Resp: 16   16  Temp: 98.4 F (36.9 C)   98.2 F (36.8 C)  TempSrc: Oral   Oral  SpO2: 92%   97%  Weight:  62.9 kg 62.5 kg   Height:        General exam: Elderly female, moderately built and frail, lying comfortably propped up in bed without distress. Respiratory system: Clear to auscultation. Respiratory effort normal. Cardiovascular system: S1 & S2 heard, RRR. No JVD, murmurs, rubs, gallops or clicks. No pedal edema. Gastrointestinal system: Abdomen is nondistended, soft and nontender. No organomegaly or masses felt. Normal bowel sounds heard. Central nervous system: Alert and oriented. No focal neurological deficits. Extremities: Symmetric 5 x 5 power. Skin: No rashes, lesions or ulcers Psychiatry: Judgement and insight appear normal. Mood & affect appropriate.     Data Reviewed:   I have personally reviewed following labs and imaging studies   CBC: Recent Labs  Lab 12/31/21 1106 01/02/22 0338  WBC 10.9* 6.3  NEUTROABS 6.5  --   HGB 14.7 13.1  HCT 43.7 38.7  MCV 89.5 90.6  PLT 321 568    Basic Metabolic Panel: Recent Labs  Lab 12/31/21 1106 01/02/22 0338  NA 138 140  K 3.2* 3.4*  CL 97* 104  CO2 26 24  GLUCOSE 140* 96  BUN 21 8  CREATININE 1.44* 0.97  CALCIUM 10.1 9.8    Liver Function Tests: Recent Labs  Lab 12/31/21 1106  AST 21  ALT 16  ALKPHOS 79  BILITOT 0.5  PROT 7.6  ALBUMIN 4.6    CBG: Recent Labs  Lab 12/31/21 1118  GLUCAP 142*    Microbiology Studies:  No results found for this or any previous visit (from the past 240 hour(s)).  Radiology Studies:  ECHOCARDIOGRAM COMPLETE  Result Date: 01/01/2022    ECHOCARDIOGRAM REPORT   Patient Name:   Kristen Suarez Date of Exam: 01/01/2022 Medical Rec #:  [id number]         Height:       62.0 in Accession #:    [id number]        Weight:       138.0 lb Date of Birth:  [birth year]         BSA:          1.633 m Patient Age:    64 years           BP:           153/97 mmHg Patient Gender: F                 HR:           82 bpm. Exam Location:  Inpatient Procedure: 2D Echo, Color Doppler and Cardiac Doppler Indications:    Stroke i63.9  History:        Patient has prior history of Echocardiogram examinations, most                 recent 02/16/2021. CAD, COPD; Risk Factors:Hypertension and  Dyslipidemia.  Sonographer:    Raquel Sarna Senior RDCS Referring Phys: [id number] [place]  1. Left ventricular ejection fraction, by estimation, is >75%. The left ventricle has hyperdynamic function. The left ventricle has no regional wall motion abnormalities. Left ventricular diastolic parameters are consistent with Grade I diastolic dysfunction (impaired relaxation).  2. Right ventricular systolic function is mildly reduced. The right ventricular size is normal.  3. The mitral valve is abnormal. Trivial mitral valve regurgitation.  4. The aortic valve is tricuspid. Aortic valve regurgitation is not visualized.  5. The inferior vena cava is normal in size with greater than 50% respiratory variability, suggesting right atrial pressure of 3 mmHg.  6. Cannot exclude a small PFO. Comparison(s): Changes from prior study are noted. 02/16/2021: LVEF 60-65%. FINDINGS  Left Ventricle: Left ventricular ejection fraction, by estimation, is >75%. The left ventricle has hyperdynamic function. The left ventricle has no regional wall motion abnormalities. The left ventricular internal cavity size was normal in size. There is no left ventricular hypertrophy. Left ventricular diastolic parameters are consistent with Grade I diastolic dysfunction (impaired relaxation). Indeterminate filling pressures. Right Ventricle: The right ventricular size is normal. No increase in right ventricular wall thickness. Right ventricular systolic function is mildly reduced. Left Atrium: Left atrial size was normal in size. Right Atrium: Right atrial size was normal in size.  Pericardium: There is no evidence of pericardial effusion. Mitral Valve: The mitral valve is abnormal. There is mild calcification of the mitral valve leaflet(s). Trivial mitral valve regurgitation. Tricuspid Valve: The tricuspid valve is grossly normal. Tricuspid valve regurgitation is trivial. Aortic Valve: The aortic valve is tricuspid. Aortic valve regurgitation is not visualized. Pulmonic Valve: The pulmonic valve was normal in structure. Pulmonic valve regurgitation is not visualized. Aorta: The aortic root and ascending aorta are structurally normal, with no evidence of dilitation. Venous: The inferior vena cava is normal in size with greater than 50% respiratory variability, suggesting right atrial pressure of 3 mmHg. IAS/Shunts: Cannot exclude a small PFO.  LEFT VENTRICLE PLAX 2D LVIDd:         4.45 cm   Diastology LVIDs:         2.10 cm   LV e' medial:    6.42 cm/s LV PW:         0.75 cm   LV E/e' medial:  10.2 LV IVS:        0.70 cm   LV e' lateral:   6.20 cm/s LVOT diam:     2.00 cm   LV E/e' lateral: 10.6 LV SV:         85 LV SV Index:   52 LVOT Area:     3.14 cm  RIGHT VENTRICLE RV S prime:     8.16 cm/s TAPSE (M-mode): 1.8 cm LEFT ATRIUM             Index        RIGHT ATRIUM           Index LA diam:        3.00 cm 1.84 cm/m   RA Area:     10.30 cm LA Vol (A2C):   28.4 ml 17.39 ml/m  RA Volume:   19.10 ml  11.70 ml/m LA Vol (A4C):   32.0 ml 19.60 ml/m LA Biplane Vol: 32.5 ml 19.90 ml/m  AORTIC VALVE LVOT Vmax:   132.00 cm/s LVOT Vmean:  97.800 cm/s LVOT VTI:    0.272 m  AORTA Ao Root diam: 2.50 cm  Ao Asc diam:  3.50 cm MITRAL VALVE MV Area (PHT): 2.27 cm    SHUNTS MV Decel Time: 334 msec    Systemic VTI:  0.27 m MV E velocity: 65.70 cm/s  Systemic Diam: 2.00 cm MV A velocity: 85.40 cm/s MV E/A ratio:  0.77 Lyman Bishop MD Electronically signed by Lyman Bishop MD Signature Date/Time: 01/01/2022/7:16:14 PM    Final    DG Chest Port 1 View  Result Date: 12/31/2021 CLINICAL DATA:  Syncope,  history of sarcoidosis EXAM: PORTABLE CHEST 1 VIEW COMPARISON:  02/05/2021 FINDINGS: Normal cardiac and mediastinal contours. Aortic atherosclerosis. Redemonstrated bilateral upper lobe scarring and volume loss with superior retraction of the left hila. No new focal pulmonary opacity. No pleural effusion or pneumothorax. No acute osseous abnormality. IMPRESSION: Redemonstrated bilateral upper lobe scarring and volume loss with superior retraction of the left hila, consistent with the patient's history of sarcoidosis. No new focal pulmonary opacity. Electronically Signed   By: Merilyn Baba M.D.   On: 12/31/2021 11:43   CT Head Wo Contrast  Result Date: 12/31/2021 CLINICAL DATA:  Syncope EXAM: CT HEAD WITHOUT CONTRAST TECHNIQUE: Contiguous axial images were obtained from the base of the skull through the vertex without intravenous contrast. RADIATION DOSE REDUCTION: This exam was performed according to the departmental dose-optimization program which includes automated exposure control, adjustment of the mA and/or kV according to patient size and/or use of iterative reconstruction technique. COMPARISON:  CT head dated 11/16/2018 FINDINGS: Brain: No evidence of acute infarction, hemorrhage, hydrocephalus, extra-axial collection or mass lesion/mass effect. Vascular: No hyperdense vessel or unexpected calcification. Skull: Similar appearance of right parietal hemangioma. No acute fracture or dislocation. Sinuses/Orbits: Bilateral pseudophakia. Right maxillary mucous retention cyst. The sinuses and mastoid air cells are otherwise clear. Other: None. IMPRESSION: 1. No acute intracranial or large territorial infarction. 2. No acute fracture or dislocation. Electronically Signed   By: Darrin Nipper M.D.   On: 12/31/2021 11:42    Scheduled Meds:    amLODipine  10 mg Oral Daily   aspirin EC  81 mg Oral Daily   enoxaparin (LOVENOX) injection  30 mg Subcutaneous Q24H   fluticasone  2 spray Each Nare Daily    mometasone-formoterol  2 puff Inhalation BID   pantoprazole  40 mg Oral Daily   rosuvastatin  40 mg Oral Daily   sodium chloride flush  3 mL Intravenous Q12H   sodium chloride flush  3 mL Intravenous Q12H   umeclidinium bromide  1 puff Inhalation Daily    Continuous Infusions:    sodium chloride     sodium chloride 1 mL/kg/hr (01/02/22 0700)   lactated ringers 62.6 mL/hr at 01/02/22 0700   potassium chloride 10 mEq (01/02/22 0949)     LOS: 0 days     Vernell Leep, MD,  FACP, [facility] [place], [facility], [facility], [place]     To contact the attending provider between 7A-7P or the covering provider during after hours 7P-7A, please log into the web site [email or web address] and access using universal [place] password for that web site. If you do not have the password, please call the hospital operator.  01/02/2022, 9:51 AM

## 2022-01-02 NOTE — Consult Note (Addendum)
[address]       ,[place] [address]             [phone]        Kristen Suarez [place] Medical Record #[id number] Date of Birth: November 05, 1945  Referring: No ref. provider found Primary Care: Nche, [name], NP Primary Cardiologist:Tiffany Oval Linsey, MD  Chief Complaint:    Chief Complaint  Patient presents with   Near Syncope   Emesis    History of Present Illness: We are asked to see this 76 year old female in cardiothoracic surgical consultation for consideration of coronary artery surgical revascularization.  The patient presented to the emergency department yesterday following an episode of syncope.  He reports that during her yoga class she had a wave of feeling hot and became diaphoretic.  He has had a similar episode in the past so he decided to sit on the floor and then proceeded to pass out.  Reportedly she was out for at least a few seconds and upon waking she was very dizzy.  She denied chest pain or shortness of breath.  Reports of recurrent syncope over the past 20 years but notes that the episodes are lasting longer and are more difficult to recover from.  She has had a loop recorder placed in the past.  In the emergency room a CT of the head was obtained and it was unremarkable.  Her chest x-ray was unremarkable.  She was felt to have signs of clinical dehydration and was given IV fluids.  Her EKG was unremarkable.  Is noted the patient did have a cardiac CT scan recently and was found to have coronary stenosis so cardiology consultation was obtained and recommendations for admission under the hospitalist with plans for further cardiology evaluation.  It is noted the CTA showed the markedly elevated calcium score of 1527.  this was to include cardiac catheterization.  This was performed today and she is found to have severe three-vessel coronary artery disease.  Ejection fraction is noted to be 60 to 65% with mid cavitary gradient due to  hyperdynamic LV function and grade 1 diastolic dysfunction.  Please see the full report below.  Echocardiogram is also been obtained.  This shows LVEF EF by estimation to be greater than 75%.  Ventricular function is mildly reduced.  The aortic valve is tricuspid but poorly visualized.  There is also noted to be trivial mitral valve regurgitation.  A small PFO could not be excluded.  Please see the full report below.  Additional cardiac risk factors include hyperlipidemia and hypertension.  She is also noted to have a sarcoid.  She is not on steroids.  He has a history of renal artery restenosis with a creatinine is noted to be in the normal range on 01/02/2022 but she does have a history of chronic kidney disease stage IIIb.     Current Activity/ Functional Status: Patient is independent with mobility/ambulation, transfers, ADL's, IADL's.   Zubrod Score: At the time of surgery this patient's most appropriate activity status/level should be described as: '[x]'$     0    Normal activity, no symptoms '[]'$     1    Restricted in physical strenuous activity but ambulatory, able to do out light work '[]'$     2    Ambulatory and capable of self care, unable to do work activities, up and about  more than 50%  Of the time                            '[]'$     3    Only limited self care, in bed greater than 50% of waking hours '[]'$     4    Completely disabled, no self care, confined to bed or chair '[]'$     5    Moribund  Past Medical History:  Diagnosis Date   Aortic valve sclerosis 09/25/2021   Arthritis    Asthma    CAD in native artery 08/15/2020   Chickenpox    Colon polyps 2010   Epistaxis 03/29/2021   Heart murmur    History of fainting spells of unknown cause    Hyperlipidemia    Hypertension    Renal artery stenosis ([place]) 09/25/2021   Sarcoid 06/16/1976    Past Surgical History:  Procedure Laterality Date   ABDOMINAL HYSTERECTOMY  1987   BASAL CELL CARCINOMA EXCISION  06/30/2020   nose    LEFT HEART CATH AND CORONARY ANGIOGRAPHY N/A 01/02/2022   Procedure: LEFT HEART CATH AND CORONARY ANGIOGRAPHY;  Surgeon: Troy Sine, MD;  Location: [facility];  Service: Cardiovascular;  Laterality: N/A;   LUNG BIOPSY  06/16/1976   PERIPHERAL VASCULAR INTERVENTION Right 11/29/2020   Procedure: PERIPHERAL VASCULAR INTERVENTION;  Surgeon: Wellington Hampshire, MD;  Location: [facility];  Service: Cardiovascular;  Laterality: Right;  Renal Artery   RENAL ANGIOGRAPHY N/A 11/29/2020   Procedure: RENAL ANGIOGRAPHY;  Surgeon: Wellington Hampshire, MD;  Location: [facility];  Service: Cardiovascular;  Laterality: N/A;  Cataract surgery OU 2018  Social History   Tobacco Use  Smoking Status Never   Passive exposure: Past  Smokeless Tobacco Never    Social History   Substance and Sexual Activity  Alcohol Use Yes   Comment: once in a while     Allergies  Allergen Reactions   Keflex [Cephalexin] Rash   Penicillins Itching   Sulfa Antibiotics Itching    Current Facility-Administered Medications  Medication Dose Route Frequency Provider Last Rate Last Admin   0.9 %  sodium chloride infusion   Intravenous Continuous Troy Sine, MD 75 mL/hr at 01/02/22 1123 New Bag at 01/02/22 1123   0.9 %  sodium chloride infusion  250 mL Intravenous PRN Troy Sine, MD       acetaminophen (TYLENOL) tablet 650 mg  650 mg Oral Q6H PRN Karmen Bongo, MD       Or   acetaminophen (TYLENOL) suppository 650 mg  650 mg Rectal Q6H PRN Karmen Bongo, MD       acetaminophen (TYLENOL) tablet 650 mg  650 mg Oral Q4H PRN Troy Sine, MD       albuterol (PROVENTIL) (2.5 MG/3ML) 0.083% nebulizer solution 2.5 mg  2.5 mg Nebulization Q4H PRN Karmen Bongo, MD       amLODipine (NORVASC) tablet 10 mg  10 mg Oral Daily Margie Billet, PA-C   10 mg at 01/02/22 0944   aspirin EC tablet 81 mg  81 mg Oral Daily Karmen Bongo, MD   81 mg at 01/02/22 0944   [START ON 01/03/2022]  enoxaparin (LOVENOX) injection 40 mg  40 mg Subcutaneous Q24H Troy Sine, MD       fluticasone (FLONASE) 50 MCG/ACT nasal spray 2 spray  2 spray Each Nare Daily Karmen Bongo, MD  hydrALAZINE (APRESOLINE) injection 10 mg  10 mg Intravenous Q20 Min PRN Troy Sine, MD       hydrALAZINE (APRESOLINE) injection 5 mg  5 mg Intravenous Q4H PRN Karmen Bongo, MD       labetalol (NORMODYNE) injection 10 mg  10 mg Intravenous Q10 min PRN Troy Sine, MD       lactated ringers infusion   Intravenous Continuous Karmen Bongo, MD 62.6 mL/hr at 01/02/22 0700 Infusion Verify at 01/02/22 0700   mometasone-formoterol (DULERA) 200-5 MCG/ACT inhaler 2 puff  2 puff Inhalation BID Donnamae Jude, RPH   2 puff at 01/02/22 0711   ondansetron (ZOFRAN) tablet 4 mg  4 mg Oral Q6H PRN Karmen Bongo, MD       Or   ondansetron [facility]) injection 4 mg  4 mg Intravenous Q6H PRN Karmen Bongo, MD       ondansetron [facility]) injection 4 mg  4 mg Intravenous Q6H PRN Troy Sine, MD       Oral care mouth rinse  15 mL Mouth Rinse PRN Karmen Bongo, MD       pantoprazole (PROTONIX) EC tablet 40 mg  40 mg Oral Daily Karmen Bongo, MD   40 mg at 01/02/22 0944   rosuvastatin (CRESTOR) tablet 40 mg  40 mg Oral Daily Karmen Bongo, MD   40 mg at 01/02/22 0944   sodium chloride flush (NS) 0.9 % injection 3 mL  3 mL Intravenous Q12H Karmen Bongo, MD       sodium chloride flush (NS) 0.9 % injection 3 mL  3 mL Intravenous Q12H Vikki Ports R, PA-C       sodium chloride flush (NS) 0.9 % injection 3 mL  3 mL Intravenous Q12H Troy Sine, MD       sodium chloride flush (NS) 0.9 % injection 3 mL  3 mL Intravenous PRN Troy Sine, MD       umeclidinium bromide (INCRUSE ELLIPTA) 62.5 MCG/ACT 1 puff  1 puff Inhalation Daily Benetta Spar D, [facility]        Medications Prior to Admission  Medication Sig Dispense Refill Last Dose   albuterol (PROVENTIL) (2.5 MG/3ML) 0.083% nebulizer solution Take 3 mLs  (2.5 mg total) by nebulization every 6 (six) hours as needed for wheezing or shortness of breath. USE 1 VIAL VIA NEBULIZER EVERY 6 HOURS Strength: (2.5 MG/3ML) 0.083% 150 mL 5 12/31/2021   albuterol (VENTOLIN HFA) 108 (90 Base) MCG/ACT inhaler Inhale 2 puffs into the lungs every 6 (six) hours as needed for wheezing or shortness of breath. 8 g 6 12/31/2021   amLODIPine-Valsartan-HCTZ (EXFORGE HCT) 10-160-12.5 MG TABS Take 1 tablet by mouth daily. 90 tablet 3 12/31/2021   aspirin EC 81 MG tablet Take 81 mg by mouth daily. Swallow whole.   12/31/2021   baclofen (LIORESAL) 10 MG tablet Take 1 tablet (10 mg total) by mouth 3 (three) times daily as needed for muscle spasms. 90 tablet 3 12/31/2021   Budeson-Glycopyrrol-Formoterol (BREZTRI AEROSPHERE) 160-9-4.8 MCG/ACT AERO Inhale 2 puffs into the lungs in the morning and at bedtime. 32.1 g 2 12/31/2021   Calcium Carb-Cholecalciferol (CALCIUM 600 + D PO) Take 1 tablet by mouth daily.   12/31/2021   cetirizine (ZYRTEC) 10 MG tablet Take 10 mg by mouth daily as needed for allergies.   12/31/2021   empagliflozin ([name]) 10 MG TABS tablet Take 1 tablet (10 mg total) by mouth daily before breakfast. 90 tablet 3 12/31/2021   famotidine (PEPCID)  10 MG tablet Take 10 mg by mouth daily as needed for heartburn.   Past Month   mometasone (NASONEX) 50 MCG/ACT nasal spray SHAKE LIQUID AND USE 2 SPRAYS IN EACH NOSTRIL DAILY 17 each 0 12/30/2021   Multiple Vitamins-Minerals (MULTIVITAL PO) Take 1 tablet by mouth daily.    12/31/2021   pantoprazole (PROTONIX) 40 MG tablet TAKE 1 TABLET BY MOUTH EVERY DAY 30 tablet 3 12/31/2021   Probiotic Product (PROBIOTIC DAILY PO) Take 1 tablet by mouth daily.    12/31/2021   rosuvastatin (CRESTOR) 40 MG tablet Take 1 tablet (40 mg total) by mouth daily. 90 tablet 3 12/31/2021   sodium chloride (OCEAN) 0.65 % SOLN nasal spray Place 1 spray into both nostrils as needed for congestion.   Past Week   acetaminophen (TYLENOL) 500 MG  tablet Take 500 mg by mouth every 6 (six) hours as needed for moderate pain.   12/30/2021   docusate sodium (COLACE) 100 MG capsule Take 100 mg by mouth daily.    12/30/2021   metoprolol tartrate (LOPRESSOR) 100 MG tablet Take 1 tablet (100 mg total) by mouth once for 1 dose. Take 1-2 hours prior to CT scan. 1 tablet 0     Family History  Problem Relation Age of Onset   Arthritis Mother    Cancer Mother    Depression Mother    Heart attack Mother    Hyperlipidemia Mother    Hypertension Mother    Alcohol abuse Father    Heart disease Father    Hypertension Father    Hyperlipidemia Father    Heart disease Sister    Stroke Sister    Alcohol abuse Brother    Heart disease Brother    Hypertension Brother    Hyperlipidemia Brother    Multiple sclerosis Daughter    Hyperlipidemia Son    Heart disease Maternal Grandfather    Heart disease Paternal Grandfather    Hyperlipidemia Sister    Alcohol abuse Sister    Hyperlipidemia Sister    Hypertension Sister    Alcohol abuse Brother    Early death Brother    Heart attack Brother    Heart disease Brother    Hyperlipidemia Brother      Review of Systems:   Review of Systems  Constitutional:  Positive for diaphoresis, malaise/fatigue and weight loss. Negative for chills and fever.  HENT:  Negative for congestion, ear discharge, ear pain, hearing loss, nosebleeds, sinus pain, sore throat and tinnitus.   Eyes:  Negative for blurred vision, double vision, photophobia, pain, discharge and redness.  Respiratory:  Positive for cough and wheezing. Negative for hemoptysis, sputum production, shortness of breath and stridor.   Cardiovascular:  Negative for chest pain, palpitations, orthopnea, claudication, leg swelling and PND.  Gastrointestinal:  Positive for heartburn, nausea and vomiting. Negative for abdominal pain, blood in stool, constipation, diarrhea and melena.  Genitourinary:  Negative for hematuria.       Hx  of bladder prolapse   Musculoskeletal:  Positive for back pain, falls and myalgias. Negative for joint pain and neck pain.  Skin: Negative.   Neurological:  Positive for dizziness and loss of consciousness. Negative for tingling, tremors, sensory change, speech change, focal weakness, seizures, weakness and headaches.  Endo/Heme/Allergies:  Positive for environmental allergies. Negative for polydipsia. Bruises/bleeds easily.  Psychiatric/Behavioral:  The patient is nervous/anxious and has insomnia.       Physical Exam: BP 112/74   Pulse 82   Temp 98.2 F (36.8 C) (  Oral)   Resp 16   Ht '5\' 2"'$  (1.575 m)   Wt 62.5 kg   SpO2 98%   BMI 25.20 kg/m    General appearance: alert, cooperative, and no distress Head: Normocephalic, without obvious abnormality, atraumatic Neck: no adenopathy, no JVD, supple, symmetrical, trachea midline, thyroid not enlarged, symmetric, no tenderness/mass/nodules, and carotid bruit versus transmitted murmur Lymph nodes: Cervical, supraclavicular, and axillary nodes normal. Resp: Scattered coarse rhonchi Back: symmetric, no curvature. ROM normal. No CVA tenderness. Cardio: regular rate and rhythm and 2/6 systolic aortic murmur GI: soft, non-tender; bowel sounds normal; no masses,  no organomegaly Extremities: extremities normal, atraumatic, no cyanosis or edema Neurologic: Grossly normal  Diagnostic Studies & Laboratory data:     Recent Radiology Findings:   CARDIAC CATHETERIZATION  Result Date: 01/02/2022   Prox Cx to Mid Cx lesion is 70% stenosed.   1st Mrg lesion is 85% stenosed.   2nd Mrg lesion is 80% stenosed.   Mid LM to Dist LM lesion is 20% stenosed.   Ost LAD to Mid LAD lesion is 60% stenosed.   1st Diag lesion is 90% stenosed.   Mid LAD lesion is 80% stenosed.   Prox RCA-1 lesion is 80% stenosed.   Prox RCA-2 lesion is 95% stenosed.   Mid RCA lesion is 70% stenosed.   Mid RCA to Dist RCA lesion is 80% stenosed.   3rd RPL lesion is 80% stenosed. Severely calcified  coronary arteries with multivessel CAD with calcification extending into the distal left main, diffuse 90% first diagonal stenosis, 60 to 80% proximal to mid LAD stenoses; left circumflex stenoses of 85% in a moderate-sized first marginal,70% in the circumflex before the OM 2 with 80% OM 2 stenosis; and severely calcified diffuse disease in the proximal extending into the mid distal RCA with 80% focal PLA stenosis. LVEDP 22 mmHg. RECOMMENDATION: Surgical consultation for CABG revascularization surgery.   ECHOCARDIOGRAM COMPLETE  Result Date: 01/01/2022    ECHOCARDIOGRAM REPORT   Patient Name:   KENITHA GLENDINNING Date of Exam: 01/01/2022 Medical Rec #:  [id number]         Height:       62.0 in Accession #:    [id number]        Weight:       138.0 lb Date of Birth:  [birth year]         BSA:          1.633 m Patient Age:    49 years          BP:           153/97 mmHg Patient Gender: F                 HR:           82 bpm. Exam Location:  Inpatient Procedure: 2D Echo, Color Doppler and Cardiac Doppler Indications:    Stroke i63.9  History:        Patient has prior history of Echocardiogram examinations, most                 recent 02/16/2021. CAD, COPD; Risk Factors:Hypertension and                 Dyslipidemia.  Sonographer:    Raquel Sarna Senior RDCS Referring Phys: [id number] [place]  1. Left ventricular ejection fraction, by estimation, is >75%. The left ventricle has hyperdynamic function. The left ventricle has no regional wall motion abnormalities. Left ventricular diastolic parameters  are consistent with Grade I diastolic dysfunction (impaired relaxation).  2. Right ventricular systolic function is mildly reduced. The right ventricular size is normal.  3. The mitral valve is abnormal. Trivial mitral valve regurgitation.  4. The aortic valve is tricuspid. Aortic valve regurgitation is not visualized.  5. The inferior vena cava is normal in size with greater than 50% respiratory variability,  suggesting right atrial pressure of 3 mmHg.  6. Cannot exclude a small PFO. Comparison(s): Changes from prior study are noted. 02/16/2021: LVEF 60-65%. FINDINGS  Left Ventricle: Left ventricular ejection fraction, by estimation, is >75%. The left ventricle has hyperdynamic function. The left ventricle has no regional wall motion abnormalities. The left ventricular internal cavity size was normal in size. There is no left ventricular hypertrophy. Left ventricular diastolic parameters are consistent with Grade I diastolic dysfunction (impaired relaxation). Indeterminate filling pressures. Right Ventricle: The right ventricular size is normal. No increase in right ventricular wall thickness. Right ventricular systolic function is mildly reduced. Left Atrium: Left atrial size was normal in size. Right Atrium: Right atrial size was normal in size. Pericardium: There is no evidence of pericardial effusion. Mitral Valve: The mitral valve is abnormal. There is mild calcification of the mitral valve leaflet(s). Trivial mitral valve regurgitation. Tricuspid Valve: The tricuspid valve is grossly normal. Tricuspid valve regurgitation is trivial. Aortic Valve: The aortic valve is tricuspid. Aortic valve regurgitation is not visualized. Pulmonic Valve: The pulmonic valve was normal in structure. Pulmonic valve regurgitation is not visualized. Aorta: The aortic root and ascending aorta are structurally normal, with no evidence of dilitation. Venous: The inferior vena cava is normal in size with greater than 50% respiratory variability, suggesting right atrial pressure of 3 mmHg. IAS/Shunts: Cannot exclude a small PFO.  LEFT VENTRICLE PLAX 2D LVIDd:         4.45 cm   Diastology LVIDs:         2.10 cm   LV e' medial:    6.42 cm/s LV PW:         0.75 cm   LV E/e' medial:  10.2 LV IVS:        0.70 cm   LV e' lateral:   6.20 cm/s LVOT diam:     2.00 cm   LV E/e' lateral: 10.6 LV SV:         85 LV SV Index:   52 LVOT Area:     3.14 cm   RIGHT VENTRICLE RV S prime:     8.16 cm/s TAPSE (M-mode): 1.8 cm LEFT ATRIUM             Index        RIGHT ATRIUM           Index LA diam:        3.00 cm 1.84 cm/m   RA Area:     10.30 cm LA Vol (A2C):   28.4 ml 17.39 ml/m  RA Volume:   19.10 ml  11.70 ml/m LA Vol (A4C):   32.0 ml 19.60 ml/m LA Biplane Vol: 32.5 ml 19.90 ml/m  AORTIC VALVE LVOT Vmax:   132.00 cm/s LVOT Vmean:  97.800 cm/s LVOT VTI:    0.272 m  AORTA Ao Root diam: 2.50 cm Ao Asc diam:  3.50 cm MITRAL VALVE MV Area (PHT): 2.27 cm    SHUNTS MV Decel Time: 334 msec    Systemic VTI:  0.27 m MV E velocity: 65.70 cm/s  Systemic Diam: 2.00 cm MV A velocity: 85.40  cm/s MV E/A ratio:  0.77 Lyman Bishop MD Electronically signed by Lyman Bishop MD Signature Date/Time: 01/01/2022/7:16:14 PM    Final      I have independently reviewed the above radiologic studies and discussed with the patient   Recent Lab Findings: Lab Results  Component Value Date   WBC 6.3 01/02/2022   HGB 13.1 01/02/2022   HCT 38.7 01/02/2022   PLT 218 01/02/2022   GLUCOSE 96 01/02/2022   CHOL 171 07/31/2021   TRIG 99.0 07/31/2021   HDL 61.10 07/31/2021   LDLCALC 90 07/31/2021   ALT 16 12/31/2021   AST 21 12/31/2021   NA 140 01/02/2022   K 3.4 (L) 01/02/2022   CL 104 01/02/2022   CREATININE 0.97 01/02/2022   BUN 8 01/02/2022   CO2 24 01/02/2022   TSH 2.32 02/29/2020   INR 1.0 11/15/2021      Assessment / Plan: Severe three-vessel coronary artery disease. History of syncopal episodes x20 years.  Typically approximately 18 months apart.  Most recent leading to presentation to the emergency room.  More severe than previously with associated short loss of consciousness.  History of loop recorder in the past with no specific findings. Aortic valve sclerosis-moderate murmur, echocardiogram did not visualize the valve well.  Patient has normal left ventricular ejection fraction Arthritis Asthma/COPD Hyperlipidemia Hypertension History of renal artery  stenosis-status post right stent, history of stage IIIb chronic kidney disease History of bilateral  cataract surgery History of sarcoid Environmental allergies History of colon polyps History of cystocele GERD Basal cell skin cancer   The patient and her studies will be evaluated by the surgeon and recommendations will be made in terms of surgical options.       I  spent 40 minutes counseling the patient face to face.   John Giovanni, PA-C  01/02/2022 1:06 PM   Seen and discussed.   9F with symptomatic 3v CAD. Normal EF. No sig valvular disease.  + renal and pulmonary cormorbidities. Hx of unclear, possibly vasovagal recurrent syncope  Plan for CABG x 3 on Friday.  Possible LAA clip as well.   Risks/benefits/alternatives discussed at length including 2% mortality, 10% morbidity (any organ, including renal issues, pulmonary or and fall due to syncope), and 85%+ standard recovery.  Of note, husband is retired Therapist, sports and several children in healthcare as well.   Justice Rocher MD CV Surgery

## 2022-01-03 ENCOUNTER — Inpatient Hospital Stay (HOSPITAL_COMMUNITY): Payer: Medicare Other

## 2022-01-03 ENCOUNTER — Encounter (HOSPITAL_COMMUNITY): Payer: Self-pay | Admitting: Internal Medicine

## 2022-01-03 DIAGNOSIS — U099 Post covid-19 condition, unspecified: Secondary | ICD-10-CM | POA: Diagnosis present

## 2022-01-03 DIAGNOSIS — E782 Mixed hyperlipidemia: Secondary | ICD-10-CM | POA: Diagnosis not present

## 2022-01-03 DIAGNOSIS — Q2112 Patent foramen ovale: Secondary | ICD-10-CM | POA: Diagnosis not present

## 2022-01-03 DIAGNOSIS — R55 Syncope and collapse: Secondary | ICD-10-CM | POA: Diagnosis present

## 2022-01-03 DIAGNOSIS — I10 Essential (primary) hypertension: Secondary | ICD-10-CM | POA: Diagnosis not present

## 2022-01-03 DIAGNOSIS — N179 Acute kidney failure, unspecified: Secondary | ICD-10-CM | POA: Diagnosis not present

## 2022-01-03 DIAGNOSIS — J4489 Other specified chronic obstructive pulmonary disease: Secondary | ICD-10-CM | POA: Diagnosis present

## 2022-01-03 DIAGNOSIS — E785 Hyperlipidemia, unspecified: Secondary | ICD-10-CM | POA: Diagnosis not present

## 2022-01-03 DIAGNOSIS — M1990 Unspecified osteoarthritis, unspecified site: Secondary | ICD-10-CM | POA: Diagnosis present

## 2022-01-03 DIAGNOSIS — K219 Gastro-esophageal reflux disease without esophagitis: Secondary | ICD-10-CM | POA: Diagnosis present

## 2022-01-03 DIAGNOSIS — I480 Paroxysmal atrial fibrillation: Secondary | ICD-10-CM | POA: Diagnosis not present

## 2022-01-03 DIAGNOSIS — E860 Dehydration: Secondary | ICD-10-CM | POA: Diagnosis not present

## 2022-01-03 DIAGNOSIS — E8770 Fluid overload, unspecified: Secondary | ICD-10-CM | POA: Diagnosis not present

## 2022-01-03 DIAGNOSIS — G9339 Other post infection and related fatigue syndromes: Secondary | ICD-10-CM | POA: Diagnosis present

## 2022-01-03 DIAGNOSIS — I739 Peripheral vascular disease, unspecified: Secondary | ICD-10-CM | POA: Diagnosis not present

## 2022-01-03 DIAGNOSIS — I358 Other nonrheumatic aortic valve disorders: Secondary | ICD-10-CM | POA: Diagnosis not present

## 2022-01-03 DIAGNOSIS — I25119 Atherosclerotic heart disease of native coronary artery with unspecified angina pectoris: Secondary | ICD-10-CM | POA: Diagnosis present

## 2022-01-03 DIAGNOSIS — E876 Hypokalemia: Secondary | ICD-10-CM | POA: Diagnosis present

## 2022-01-03 DIAGNOSIS — D62 Acute posthemorrhagic anemia: Secondary | ICD-10-CM | POA: Diagnosis not present

## 2022-01-03 DIAGNOSIS — N289 Disorder of kidney and ureter, unspecified: Secondary | ICD-10-CM | POA: Diagnosis not present

## 2022-01-03 DIAGNOSIS — N1832 Chronic kidney disease, stage 3b: Secondary | ICD-10-CM | POA: Diagnosis present

## 2022-01-03 DIAGNOSIS — Z85828 Personal history of other malignant neoplasm of skin: Secondary | ICD-10-CM | POA: Diagnosis not present

## 2022-01-03 DIAGNOSIS — K5900 Constipation, unspecified: Secondary | ICD-10-CM | POA: Diagnosis not present

## 2022-01-03 DIAGNOSIS — I2584 Coronary atherosclerosis due to calcified coronary lesion: Secondary | ICD-10-CM | POA: Diagnosis present

## 2022-01-03 DIAGNOSIS — Z01810 Encounter for preprocedural cardiovascular examination: Secondary | ICD-10-CM | POA: Diagnosis not present

## 2022-01-03 DIAGNOSIS — I129 Hypertensive chronic kidney disease with stage 1 through stage 4 chronic kidney disease, or unspecified chronic kidney disease: Secondary | ICD-10-CM | POA: Diagnosis not present

## 2022-01-03 DIAGNOSIS — I2510 Atherosclerotic heart disease of native coronary artery without angina pectoris: Secondary | ICD-10-CM | POA: Diagnosis not present

## 2022-01-03 DIAGNOSIS — J449 Chronic obstructive pulmonary disease, unspecified: Secondary | ICD-10-CM | POA: Diagnosis not present

## 2022-01-03 DIAGNOSIS — Z8249 Family history of ischemic heart disease and other diseases of the circulatory system: Secondary | ICD-10-CM | POA: Diagnosis not present

## 2022-01-03 DIAGNOSIS — D860 Sarcoidosis of lung: Secondary | ICD-10-CM | POA: Diagnosis not present

## 2022-01-03 DIAGNOSIS — Z79899 Other long term (current) drug therapy: Secondary | ICD-10-CM | POA: Diagnosis not present

## 2022-01-03 DIAGNOSIS — I4891 Unspecified atrial fibrillation: Secondary | ICD-10-CM | POA: Diagnosis not present

## 2022-01-03 LAB — CBC
HCT: 39 % (ref 36.0–46.0)
Hemoglobin: 12.9 g/dL (ref 12.0–15.0)
MCH: 30 pg (ref 26.0–34.0)
MCHC: 33.1 g/dL (ref 30.0–36.0)
MCV: 90.7 fL (ref 80.0–100.0)
Platelets: 214 10*3/uL (ref 150–400)
RBC: 4.3 MIL/uL (ref 3.87–5.11)
RDW: 13.1 % (ref 11.5–15.5)
WBC: 6.8 10*3/uL (ref 4.0–10.5)
nRBC: 0 % (ref 0.0–0.2)

## 2022-01-03 LAB — BASIC METABOLIC PANEL
Anion gap: 9 (ref 5–15)
BUN: 10 mg/dL (ref 8–23)
CO2: 24 mmol/L (ref 22–32)
Calcium: 9.2 mg/dL (ref 8.9–10.3)
Chloride: 105 mmol/L (ref 98–111)
Creatinine, Ser: 1.07 mg/dL — ABNORMAL HIGH (ref 0.44–1.00)
GFR, Estimated: 54 mL/min — ABNORMAL LOW (ref >60)
Glucose, Bld: 105 mg/dL — ABNORMAL HIGH (ref 70–99)
Potassium: 3.3 mmol/L — ABNORMAL LOW (ref 3.5–5.1)
Sodium: 138 mmol/L (ref 135–145)

## 2022-01-03 LAB — LIPID PANEL
Cholesterol: 141 mg/dL (ref 0–200)
HDL: 49 mg/dL (ref >40)
LDL Cholesterol: 76 mg/dL (ref 0–99)
Total CHOL/HDL Ratio: 2.9 RATIO
Triglycerides: 82 mg/dL (ref <150)
VLDL: 16 mg/dL (ref 0–40)

## 2022-01-03 LAB — ABO/RH: ABO/RH(D): O POS

## 2022-01-03 LAB — MAGNESIUM: Magnesium: 1.9 mg/dL (ref 1.7–2.4)

## 2022-01-03 MED ORDER — TRANEXAMIC ACID (OHS) PUMP PRIME SOLUTION
2.0000 mg/kg | INTRAVENOUS | Status: DC
  Filled 2022-01-03: qty 1.25

## 2022-01-03 MED ORDER — CHLORHEXIDINE GLUCONATE 4 % EX LIQD
60.0000 mL | Freq: Once | CUTANEOUS | Status: AC
  Administered 2022-01-04: 4 via TOPICAL
  Filled 2022-01-03: qty 60

## 2022-01-03 MED ORDER — TRANEXAMIC ACID 1000 MG/10ML IV SOLN
1.5000 mg/kg/h | INTRAVENOUS | Status: AC
  Administered 2022-01-04: 1.5 mg/kg/h via INTRAVENOUS
  Filled 2022-01-03: qty 25

## 2022-01-03 MED ORDER — EPINEPHRINE HCL 5 MG/250ML IV SOLN IN NS
0.0000 ug/min | INTRAVENOUS | Status: DC
  Filled 2022-01-03: qty 250

## 2022-01-03 MED ORDER — ALUM & MAG HYDROXIDE-SIMETH 200-200-20 MG/5ML PO SUSP
15.0000 mL | Freq: Once | ORAL | Status: AC | PRN
  Administered 2022-01-03: 15 mL via ORAL
  Filled 2022-01-03: qty 30

## 2022-01-03 MED ORDER — POTASSIUM CHLORIDE CRYS ER 20 MEQ PO TBCR
30.0000 meq | EXTENDED_RELEASE_TABLET | ORAL | Status: AC
  Administered 2022-01-03 (×2): 30 meq via ORAL
  Filled 2022-01-03 (×2): qty 1

## 2022-01-03 MED ORDER — HYDRALAZINE HCL 50 MG PO TABS: 50.0000 mg | ORAL_TABLET | Freq: Three times a day (TID) | ORAL | Status: DC

## 2022-01-03 MED ORDER — CARVEDILOL 12.5 MG PO TABS: 12.5000 mg | ORAL_TABLET | Freq: Two times a day (BID) | ORAL | Status: DC

## 2022-01-03 MED ORDER — VANCOMYCIN HCL 1250 MG/250ML IV SOLN
1250.0000 mg | INTRAVENOUS | Status: AC
  Administered 2022-01-04: 1250 mg via INTRAVENOUS
  Filled 2022-01-03: qty 250

## 2022-01-03 MED ORDER — PLASMA-LYTE A IV SOLN
INTRAVENOUS | Status: DC
  Filled 2022-01-03: qty 2.5

## 2022-01-03 MED ORDER — INSULIN REGULAR(HUMAN) IN NACL 100-0.9 UT/100ML-% IV SOLN
INTRAVENOUS | Status: AC
  Administered 2022-01-04: .7 [IU]/h via INTRAVENOUS
  Filled 2022-01-03: qty 100

## 2022-01-03 MED ORDER — BISACODYL 5 MG PO TBEC
5.0000 mg | DELAYED_RELEASE_TABLET | Freq: Once | ORAL | Status: AC
  Administered 2022-01-03: 5 mg via ORAL
  Filled 2022-01-03: qty 1

## 2022-01-03 MED ORDER — MANNITOL 20 % IV SOLN
INTRAVENOUS | Status: DC
  Filled 2022-01-03 (×2): qty 13

## 2022-01-03 MED ORDER — CHLORHEXIDINE GLUCONATE 0.12 % MT SOLN
15.0000 mL | Freq: Once | OROMUCOSAL | Status: AC
  Administered 2022-01-04: 15 mL via OROMUCOSAL
  Filled 2022-01-03: qty 15

## 2022-01-03 MED ORDER — MILRINONE LACTATE IN DEXTROSE 20-5 MG/100ML-% IV SOLN
0.3000 ug/kg/min | INTRAVENOUS | Status: DC
  Filled 2022-01-03: qty 100

## 2022-01-03 MED ORDER — METOPROLOL TARTRATE 12.5 MG HALF TABLET
12.5000 mg | ORAL_TABLET | Freq: Once | ORAL | Status: AC
  Administered 2022-01-04: 12.5 mg via ORAL
  Filled 2022-01-03: qty 1

## 2022-01-03 MED ORDER — LEVOFLOXACIN IN D5W 500 MG/100ML IV SOLN
500.0000 mg | INTRAVENOUS | Status: AC
  Administered 2022-01-04: 500 mg via INTRAVENOUS
  Filled 2022-01-03: qty 100

## 2022-01-03 MED ORDER — TRANEXAMIC ACID (OHS) BOLUS VIA INFUSION
15.0000 mg/kg | INTRAVENOUS | Status: AC
  Administered 2022-01-04: 937.5 mg via INTRAVENOUS
  Filled 2022-01-03: qty 938

## 2022-01-03 MED ORDER — TEMAZEPAM 15 MG PO CAPS
15.0000 mg | ORAL_CAPSULE | Freq: Once | ORAL | Status: AC | PRN
  Administered 2022-01-03: 15 mg via ORAL
  Filled 2022-01-03: qty 1

## 2022-01-03 MED ORDER — CHLORHEXIDINE GLUCONATE 4 % EX LIQD
60.0000 mL | Freq: Once | CUTANEOUS | Status: AC
  Administered 2022-01-03: 4 via TOPICAL
  Filled 2022-01-03: qty 60

## 2022-01-03 MED ORDER — POTASSIUM CHLORIDE 2 MEQ/ML IV SOLN
80.0000 meq | INTRAVENOUS | Status: DC
  Filled 2022-01-03: qty 40

## 2022-01-03 MED ORDER — HEPARIN 30,000 UNITS/1000 ML (OHS) CELLSAVER SOLUTION
Status: DC
  Filled 2022-01-03: qty 1000

## 2022-01-03 MED ORDER — CARVEDILOL 12.5 MG PO TABS
12.5000 mg | ORAL_TABLET | Freq: Two times a day (BID) | ORAL | Status: DC
  Administered 2022-01-03: 12.5 mg via ORAL
  Filled 2022-01-03: qty 1

## 2022-01-03 MED ORDER — PHENYLEPHRINE HCL-NACL 20-0.9 MG/250ML-% IV SOLN
30.0000 ug/min | INTRAVENOUS | Status: AC
  Administered 2022-01-04: 30 ug/min via INTRAVENOUS
  Filled 2022-01-03: qty 250

## 2022-01-03 MED ORDER — NOREPINEPHRINE 4 MG/250ML-% IV SOLN
0.0000 ug/min | INTRAVENOUS | Status: DC
  Filled 2022-01-03: qty 250

## 2022-01-03 MED ORDER — DEXMEDETOMIDINE HCL IN NACL 400 MCG/100ML IV SOLN
0.1000 ug/kg/h | INTRAVENOUS | Status: AC
  Administered 2022-01-04: .5 ug/kg/h via INTRAVENOUS
  Filled 2022-01-03: qty 100

## 2022-01-03 NOTE — Plan of Care (Signed)
  Problem: Education: Goal: Knowledge of General Education information will improve Description: Including pain rating scale, medication(s)/side effects and non-pharmacologic comfort measures Outcome: Progressing   Problem: Health Behavior/Discharge Planning: Goal: Ability to manage health-related needs will improve Outcome: Progressing   Problem: Clinical Measurements: Goal: Ability to maintain clinical measurements within normal limits will improve Outcome: Progressing Goal: Will remain free from infection Outcome: Progressing Goal: Diagnostic test results will improve Outcome: Progressing Goal: Respiratory complications will improve Outcome: Progressing Goal: Cardiovascular complication will be avoided Outcome: Progressing   Problem: Activity: Goal: Risk for activity intolerance will decrease Outcome: Progressing   Problem: Nutrition: Goal: Adequate nutrition will be maintained Outcome: Progressing   Problem: Coping: Goal: Level of anxiety will decrease Outcome: Progressing   Problem: Elimination: Goal: Will not experience complications related to bowel motility Outcome: Progressing Goal: Will not experience complications related to urinary retention Outcome: Progressing   Problem: Pain Managment: Goal: General experience of comfort will improve Outcome: Progressing   Problem: Safety: Goal: Ability to remain free from injury will improve Outcome: Progressing   Problem: Skin Integrity: Goal: Risk for impaired skin integrity will decrease Outcome: Progressing   Problem: Education: Goal: Knowledge of condition and prescribed therapy will improve Outcome: Progressing   Problem: Cardiac: Goal: Will achieve and/or maintain adequate cardiac output Outcome: Progressing   Problem: Physical Regulation: Goal: Complications related to the disease process, condition or treatment will be avoided or minimized Outcome: Progressing   Problem: Education: Goal:  Understanding of CV disease, CV risk reduction, and recovery process will improve Outcome: Progressing Goal: Individualized Educational Video(s) Outcome: Progressing   Problem: Activity: Goal: Ability to return to baseline activity level will improve Outcome: Progressing   Problem: Cardiovascular: Goal: Ability to achieve and maintain adequate cardiovascular perfusion will improve Outcome: Progressing Goal: Vascular access site(s) Level 0-1 will be maintained Outcome: Progressing   Problem: Health Behavior/Discharge Planning: Goal: Ability to safely manage health-related needs after discharge will improve Outcome: Progressing

## 2022-01-03 NOTE — Progress Notes (Signed)
Rounding Note    Patient Name: Kristen Suarez Date of Encounter: 01/03/2022  [place] Cardiologist: Skeet Latch, MD   Subjective     Inpatient Medications    Scheduled Meds:  amLODipine  10 mg Oral Daily   aspirin EC  81 mg Oral Daily   enoxaparin (LOVENOX) injection  40 mg Subcutaneous Q24H   [START ON 01/04/2022] epinephrine  0-10 mcg/min Intravenous To OR   fluticasone  2 spray Each Nare Daily   [START ON 01/04/2022] heparin sodium (porcine) 2,500 Units, papaverine 30 mg in electrolyte-A (PLASMALYTE-A PH 7.4) 500 mL irrigation   Irrigation To OR   [START ON 01/04/2022] insulin   Intravenous To OR   [START ON 01/04/2022] [place] Blood Cardioplegia vial (lidocaine/magnesium/mannitol 0.26g-4g-6.4g)   Intracoronary To OR   mometasone-formoterol  2 puff Inhalation BID   pantoprazole  40 mg Oral Daily   [START ON 01/04/2022] phenylephrine  30-200 mcg/min Intravenous To OR   potassium chloride  30 mEq Oral Q4H   [START ON 01/04/2022] potassium chloride  80 mEq Other To OR   rosuvastatin  40 mg Oral Daily   sodium chloride flush  3 mL Intravenous Q12H   [START ON 01/04/2022] tranexamic acid  15 mg/kg Intravenous To OR   [START ON 01/04/2022] tranexamic acid  2 mg/kg Intracatheter To OR   umeclidinium bromide  1 puff Inhalation Daily   Continuous Infusions:  [START ON 01/04/2022] dexmedetomidine     [START ON 01/04/2022] heparin 30,000 units/NS 1000 mL solution for CELLSAVER     [START ON 01/04/2022] levofloxacin (LEVAQUIN) IV     [START ON 01/04/2022] milrinone     [START ON 01/04/2022] norepinephrine     [START ON 01/04/2022] tranexamic acid (CYKLOKAPRON) 2,500 mg in sodium chloride 0.9 % 250 mL (10 mg/mL) infusion     [START ON 01/04/2022] vancomycin     PRN Meds: acetaminophen **OR** acetaminophen, acetaminophen, albuterol, hydrALAZINE, ondansetron **OR** ondansetron (ZOFRAN) IV, ondansetron (ZOFRAN) IV, mouth rinse, sodium chloride flush    Vital Signs    Vitals:   01/02/22 2349 01/03/22 0305 01/03/22 0721 01/03/22 0811  BP: (!) 163/94 (!) 163/76 (!) 165/72   Pulse: 79 70 73   Resp: '16 16 16   '$ Temp: 98 F (36.7 C) 98 F (36.7 C) 99 F (37.2 C)   TempSrc: Oral Oral Oral   SpO2: 91% 97% 96% 97%  Weight:      Height:        Intake/Output Summary (Last 24 hours) at 01/03/2022 0957 Last data filed at 01/03/2022 0800 Gross per 24 hour  Intake 883.81 ml  Output --  Net 883.81 ml      01/02/2022    7:12 AM 01/02/2022    6:00 AM 01/01/2022   12:59 PM  Last 3 Weights  Weight (lbs) 137 lb 12.6 oz 138 lb 10.7 oz 138 lb  Weight (kg) 62.5 kg 62.9 kg 62.596 kg      Telemetry    Sinus rhythm.   - Personally Reviewed  ECG    N/a - Personally Reviewed  Physical Exam   VS:  BP (!) 165/72 (BP Location: Left Arm)   Pulse 73   Temp 99 F (37.2 C) (Oral)   Resp 16   Ht '5\' 2"'$  (1.575 m)   Wt 62.5 kg   SpO2 97%   BMI 25.20 kg/m  , BMI Body mass index is 25.2 kg/m. GENERAL:  Well appearing HEENT: Pupils equal round and reactive, fundi  not visualized, oral mucosa unremarkable NECK:  No jugular venous distention, waveform within normal limits, carotid upstroke brisk and symmetric, no bruits, no thyromegaly LUNGS:  Clear to auscultation bilaterally HEART:  RRR.  PMI not displaced or sustained,S1 and S2 within normal limits, no S3, no S4, no clicks, no rubs, no murmurs ABD:  Flat, positive bowel sounds normal in frequency in pitch, no bruits, no rebound, no guarding, no midline pulsatile mass, no hepatomegaly, no splenomegaly EXT:  2 plus pulses throughout, no edema, no cyanosis no clubbing SKIN:  No rashes no nodules NEURO:  Cranial nerves II through XII grossly intact, motor grossly intact throughout PSYCH:  Cognitively intact, oriented to person place and time   Labs    High Sensitivity Troponin:   Recent Labs  Lab 12/31/21 1106 12/31/21 1415  TROPONINIHS 4 3     Chemistry Recent Labs  Lab  12/31/21 1106 01/02/22 0338 01/03/22 0320  NA 138 140 138  K 3.2* 3.4* 3.3*  CL 97* 104 105  CO2 '26 24 24  '$ GLUCOSE 140* 96 105*  BUN '21 8 10  '$ CREATININE 1.44* 0.97 1.07*  CALCIUM 10.1 9.8 9.2  MG  --   --  1.9  PROT 7.6  --   --   ALBUMIN 4.6  --   --   AST 21  --   --   ALT 16  --   --   ALKPHOS 79  --   --   BILITOT 0.5  --   --   GFRNONAA 38* >60 54*  ANIONGAP '15 12 9    '$ Lipids No results for input(s): "CHOL", "TRIG", "HDL", "LABVLDL", "LDLCALC", "CHOLHDL" in the last 168 hours.  Hematology Recent Labs  Lab 12/31/21 1106 01/02/22 0338 01/03/22 0320  WBC 10.9* 6.3 6.8  RBC 4.88 4.27 4.30  HGB 14.7 13.1 12.9  HCT 43.7 38.7 39.0  MCV 89.5 90.6 90.7  MCH 30.1 30.7 30.0  MCHC 33.6 33.9 33.1  RDW 13.2 13.2 13.1  PLT 321 218 214   Thyroid No results for input(s): "TSH", "FREET4" in the last 168 hours.  BNPNo results for input(s): "BNP", "PROBNP" in the last 168 hours.  DDimer No results for input(s): "DDIMER" in the last 168 hours.   Radiology    VAS US DOPPLER PRE CABG  Result Date: 01/03/2022 PREOPERATIVE VASCULAR EVALUATION Patient Name:  Kristen Suarez  Date of Exam:   01/03/2022 Medical Rec #: [id number]          Accession #:    [id number] Date of Birth: Jun 10, 1945          Patient Gender: F Patient Age:   76 years Exam Location:  [facility] Procedure:      VAS US DOPPLER PRE CABG Referring Phys: DANIEL ENTER --------------------------------------------------------------------------------  Indications:      Pre-CABG. Risk Factors:     Hypertension, hyperlipidemia, coronary artery disease, PAD. Other Factors:    History of right renal artery stenosis s/p renal stent. Limitations:      Patient had right radial cath 01/03/22 and has not been 24                   hours since stent Comparison Study: Prior normal carotid duplex 02/16/21 Performing Technologist: Sharion Dove RVS  Examination Guidelines: A complete evaluation includes B-mode imaging, spectral  Doppler, color Doppler, and power Doppler as needed of all accessible portions of each vessel. Bilateral testing is considered an integral part of a complete  examination. Limited examinations for reoccurring indications may be performed as noted.  Right Carotid Findings: +----------+--------+--------+--------+--------+------------------+           PSV cm/sEDV cm/sStenosisDescribeComments           +----------+--------+--------+--------+--------+------------------+ CCA Prox  66      15                                         +----------+--------+--------+--------+--------+------------------+ CCA Distal71      17                      intimal thickening +----------+--------+--------+--------+--------+------------------+ ICA Prox  66      12              calcific                   +----------+--------+--------+--------+--------+------------------+ ICA Mid   71      20                                         +----------+--------+--------+--------+--------+------------------+ ICA Distal77      23                                         +----------+--------+--------+--------+--------+------------------+ ECA       96      10                                         +----------+--------+--------+--------+--------+------------------+ +----------+--------+-------+--------+------------+           PSV cm/sEDV cmsDescribeArm Pressure +----------+--------+-------+--------+------------+ [id number]                                 +----------+--------+-------+--------+------------+ +---------+--------+--+--------+--+ VertebralPSV cm/s53EDV cm/s17 +---------+--------+--+--------+--+ Left Carotid Findings: +----------+--------+--------+--------+--------+------------------+           PSV cm/sEDV cm/sStenosisDescribeComments           +----------+--------+--------+--------+--------+------------------+ CCA Prox  83      17                                          +----------+--------+--------+--------+--------+------------------+ CCA Distal71      15                      intimal thickening +----------+--------+--------+--------+--------+------------------+ ICA Prox  63      17              calcific                   +----------+--------+--------+--------+--------+------------------+ ICA Mid   62      15                                         +----------+--------+--------+--------+--------+------------------+ ICA Distal58      17                                         +----------+--------+--------+--------+--------+------------------+  ECA       92      8                                          +----------+--------+--------+--------+--------+------------------+  +----------+--------+--------+--------+------------+ SubclavianPSV cm/sEDV cm/sDescribeArm Pressure +----------+--------+--------+--------+------------+           125                                  +----------+--------+--------+--------+------------+ +---------+--------+--+--------+-+ VertebralPSV cm/s35EDV cm/s8 +---------+--------+--+--------+-+  ABI Findings: +--------+------------------+-----+-----------+------------------------+ Right   Rt Pressure (mmHg)IndexWaveform   Comment                  +--------+------------------+-----+-----------+------------------------+ Brachial                       triphasic  post radial cath <24 hrs +--------+------------------+-----+-----------+------------------------+ PTA                            multiphasic                         +--------+------------------+-----+-----------+------------------------+ PERO                           multiphasic                         +--------+------------------+-----+-----------+------------------------+ +--------+------------------+-----+-----------+-------+ Left    Lt Pressure (mmHg)IndexWaveform   Comment  +--------+------------------+-----+-----------+-------+ [id number]                    triphasic          +--------+------------------+-----+-----------+-------+ PERO                           multiphasic        +--------+------------------+-----+-----------+-------+ DP                             multiphasic        +--------+------------------+-----+-----------+-------+  Right Doppler Findings: +-----------+--------+-----+---------+------------------------+ Site       PressureIndexDoppler  Comments                 +-----------+--------+-----+---------+------------------------+ Brachial                triphasicpost radial cath <24 hrs +-----------+--------+-----+---------+------------------------+ Radial                  triphasic                         +-----------+--------+-----+---------+------------------------+ Ulnar                   triphasic                         +-----------+--------+-----+---------+------------------------+ Palmar Arch                      post radial cath <24 hrs +-----------+--------+-----+---------+------------------------+  Left Doppler Findings: +--------+--------+-----+---------+--------+ Site    PressureIndexDoppler  Comments +--------+--------+-----+---------+--------+ [id number]          triphasic         +--------+--------+-----+---------+--------+  Radial               triphasic         +--------+--------+-----+---------+--------+ Ulnar                triphasic         +--------+--------+-----+---------+--------+   Summary: Right Carotid: The extracranial vessels were near-normal with only minimal wall                thickening or plaque. Left Carotid: The extracranial vessels were near-normal with only minimal wall               thickening or plaque. Vertebrals:  Bilateral vertebral arteries demonstrate antegrade flow. Subclavians: Normal flow hemodynamics were seen in bilateral subclavian               arteries. Left Upper Extremity: Doppler waveforms remain within normal limits with left radial compression. Doppler waveforms decrease <50% with left ulnar compression.    Preliminary    CARDIAC CATHETERIZATION  Result Date: 01/02/2022   Prox Cx to Mid Cx lesion is 70% stenosed.   1st Mrg lesion is 85% stenosed.   2nd Mrg lesion is 80% stenosed.   Mid LM to Dist LM lesion is 20% stenosed.   Ost LAD to Mid LAD lesion is 60% stenosed.   1st Diag lesion is 90% stenosed.   Mid LAD lesion is 80% stenosed.   Prox RCA-1 lesion is 80% stenosed.   Prox RCA-2 lesion is 95% stenosed.   Mid RCA lesion is 70% stenosed.   Mid RCA to Dist RCA lesion is 80% stenosed.   3rd RPL lesion is 80% stenosed. Severely calcified coronary arteries with multivessel CAD with calcification extending into the distal left main, diffuse 90% first diagonal stenosis, 60 to 80% proximal to mid LAD stenoses; left circumflex stenoses of 85% in a moderate-sized first marginal,70% in the circumflex before the OM 2 with 80% OM 2 stenosis; and severely calcified diffuse disease in the proximal extending into the mid distal RCA with 80% focal PLA stenosis. LVEDP 22 mmHg. RECOMMENDATION: Surgical consultation for CABG revascularization surgery.   ECHOCARDIOGRAM COMPLETE  Result Date: 01/01/2022    ECHOCARDIOGRAM REPORT   Patient Name:   Kristen Suarez Date of Exam: 01/01/2022 Medical Rec #:  [id number]         Height:       62.0 in Accession #:    [id number]        Weight:       138.0 lb Date of Birth:  02-Jan-1946         BSA:          1.633 m Patient Age:    29 years          BP:           153/97 mmHg Patient Gender: F                 HR:           82 bpm. Exam Location:  Inpatient Procedure: 2D Echo, Color Doppler and Cardiac Doppler Indications:    Stroke i63.9  History:        Patient has prior history of Echocardiogram examinations, most                 recent 02/16/2021. CAD, COPD; Risk Factors:Hypertension and                 Dyslipidemia.   Sonographer:  [place] Referring Phys: [id number] [place]  1. Left ventricular ejection fraction, by estimation, is >75%. The left ventricle has hyperdynamic function. The left ventricle has no regional wall motion abnormalities. Left ventricular diastolic parameters are consistent with Grade I diastolic dysfunction (impaired relaxation).  2. Right ventricular systolic function is mildly reduced. The right ventricular size is normal.  3. The mitral valve is abnormal. Trivial mitral valve regurgitation.  4. The aortic valve is tricuspid. Aortic valve regurgitation is not visualized.  5. The inferior vena cava is normal in size with greater than 50% respiratory variability, suggesting right atrial pressure of 3 mmHg.  6. Cannot exclude a small PFO. Comparison(s): Changes from prior study are noted. 02/16/2021: LVEF 60-65%. FINDINGS  Left Ventricle: Left ventricular ejection fraction, by estimation, is >75%. The left ventricle has hyperdynamic function. The left ventricle has no regional wall motion abnormalities. The left ventricular internal cavity size was normal in size. There is no left ventricular hypertrophy. Left ventricular diastolic parameters are consistent with Grade I diastolic dysfunction (impaired relaxation). Indeterminate filling pressures. Right Ventricle: The right ventricular size is normal. No increase in right ventricular wall thickness. Right ventricular systolic function is mildly reduced. Left Atrium: Left atrial size was normal in size. Right Atrium: Right atrial size was normal in size. Pericardium: There is no evidence of pericardial effusion. Mitral Valve: The mitral valve is abnormal. There is mild calcification of the mitral valve leaflet(s). Trivial mitral valve regurgitation. Tricuspid Valve: The tricuspid valve is grossly normal. Tricuspid valve regurgitation is trivial. Aortic Valve: The aortic valve is tricuspid. Aortic valve regurgitation is not  visualized. Pulmonic Valve: The pulmonic valve was normal in structure. Pulmonic valve regurgitation is not visualized. Aorta: The aortic root and ascending aorta are structurally normal, with no evidence of dilitation. Venous: The inferior vena cava is normal in size with greater than 50% respiratory variability, suggesting right atrial pressure of 3 mmHg. IAS/Shunts: Cannot exclude a small PFO.  LEFT VENTRICLE PLAX 2D LVIDd:         4.45 cm   Diastology LVIDs:         2.10 cm   LV e' medial:    6.42 cm/s LV PW:         0.75 cm   LV E/e' medial:  10.2 LV IVS:        0.70 cm   LV e' lateral:   6.20 cm/s LVOT diam:     2.00 cm   LV E/e' lateral: 10.6 LV SV:         85 LV SV Index:   52 LVOT Area:     3.14 cm  RIGHT VENTRICLE RV S prime:     8.16 cm/s TAPSE (M-mode): 1.8 cm LEFT ATRIUM             Index        RIGHT ATRIUM           Index LA diam:        3.00 cm 1.84 cm/m   RA Area:     10.30 cm LA Vol (A2C):   28.4 ml 17.39 ml/m  RA Volume:   19.10 ml  11.70 ml/m LA Vol (A4C):   32.0 ml 19.60 ml/m LA Biplane Vol: 32.5 ml 19.90 ml/m  AORTIC VALVE LVOT Vmax:   132.00 cm/s LVOT Vmean:  97.800 cm/s LVOT VTI:    0.272 m  AORTA Ao Root diam: 2.50 cm Ao Asc diam:  3.50 cm  MITRAL VALVE MV Area (PHT): 2.27 cm    SHUNTS MV Decel Time: 334 msec    Systemic VTI:  0.27 m MV E velocity: 65.70 cm/s  Systemic Diam: 2.00 cm MV A velocity: 85.40 cm/s MV E/A ratio:  0.77 Lyman Bishop MD Electronically signed by Lyman Bishop MD Signature Date/Time: 01/01/2022/7:16:14 PM    Final     Cardiac Studies   LHC 01/02/22:   Prox Cx to Mid Cx lesion is 70% stenosed.   1st Mrg lesion is 85% stenosed.   2nd Mrg lesion is 80% stenosed.   Mid LM to Dist LM lesion is 20% stenosed.   Ost LAD to Mid LAD lesion is 60% stenosed.   1st Diag lesion is 90% stenosed.   Mid LAD lesion is 80% stenosed.   Prox RCA-1 lesion is 80% stenosed.   Prox RCA-2 lesion is 95% stenosed.   Mid RCA lesion is 70% stenosed.   Mid RCA to Dist RCA  lesion is 80% stenosed.   3rd RPL lesion is 80% stenosed.   Severely calcified coronary arteries with multivessel CAD with calcification extending into the distal left main, diffuse 90% first diagonal stenosis, 60 to 80% proximal to mid LAD stenoses; left circumflex stenoses of 85% in a moderate-sized first marginal,70% in the circumflex before the OM 2 with 80% OM 2 stenosis; and severely calcified diffuse disease in the proximal extending into the mid distal RCA with 80% focal PLA stenosis.   LVEDP 22 mmHg.  Diagnostic Dominance: Right    Echo 01/01/22:   1. Left ventricular ejection fraction, by estimation, is >75%. The left  ventricle has hyperdynamic function. The left ventricle has no regional  wall motion abnormalities. Left ventricular diastolic parameters are  consistent with Grade I diastolic  dysfunction (impaired relaxation).   2. Right ventricular systolic function is mildly reduced. The right  ventricular size is normal.   3. The mitral valve is abnormal. Trivial mitral valve regurgitation.   4. The aortic valve is tricuspid. Aortic valve regurgitation is not  visualized.   5. The inferior vena cava is normal in size with greater than 50%  respiratory variability, suggesting right atrial pressure of 3 mmHg.   6. Cannot exclude a small PFO.   Patient Profile     Ms. Lister is a 58F with hypertension, renal artery stenosis s/p stenting, sarcoidosis, and CAD admitted with syncope.  She is in the midst of an ongoing work-up for recurrent syncope and exertional dyspnea.  Assessment & Plan    # Angina:  # 3v CAD:  # Hyperlipidemia: Patient severe three-vessel disease at cath yesterday.  She has been evaluated by CT surgery and will go for CABG tomorrow.  Normal systolic function and no significant valvular abnormalities on echo this admission.  Check fasting lipids.  # Hypertension:  Blood pressure poorly controlled.  Continue amlodipine.  Her home valsartan and HCTZ  are currently on hold in the setting of AKI.  We will add carvedilol 12.'5mg'$  bid.  # Solitary kidney: # AKI:  Patient has a chronically occluded left renal artery.  She had AKI on admission that improved yesterday.  She did receive contrast yesterday and creatinine is slightly higher today.  Plan to resume valsartan and HCTZ postoperatively. Carvedilol for blood pressure control as above for now.  # Recurrent syncope:  Chronic issue.  ILR not functioning.  She came to the hospital with a syncopal event that occurred during chair yoga.  No arrhythmias on telemetry since  admission.  Planning for CABG as above.    For questions or updates, please contact [place] Please consult [email or web address] for contact info under        Signed, Skeet Latch, MD  01/03/2022, 9:57 AM

## 2022-01-03 NOTE — Progress Notes (Signed)
CARDIAC REHAB PHASE I   Pt received in bed with husband in room, agreeable to education. Pt educated on [facility] booklet, hospital stay, sternal precautions, discharge planning, and IS use. Pt demonstrated 567m on IS, continued use encouraged. Pt feeling somewhat anxious about surgery tomorrow, all questions from pt and family answered, pt left in bed with call bell in reach.  1[place] MS 01/03/2022 12:18 PM

## 2022-01-03 NOTE — Progress Notes (Signed)
PROGRESS NOTE   Kristen Suarez  XTK:[id number]    DOB: September 27, 1945    DOA: 12/31/2021  PCP: Flossie Buffy, NP   I have briefly reviewed patients previous medical records in [facility].  Chief Complaint  Patient presents with   Near Syncope   Emesis    Brief Narrative:  76 year old female with medical history significant for CAD, HTN, HLD, sarcoidosis, longstanding history of recurrent syncope (approximately 20 years), presented with recurrent syncope.  Recent outpatient coronary CTA was abnormal, cardiology consulted and plan for cardiac cath on 10/18 at 10 AM.   Assessment & Plan:  Principal Problem:   Syncope Active Problems:   Sarcoidosis   HTN (hypertension)   Hyperlipidemia   Coronary artery disease due to calcified coronary lesion   Renal artery stenosis (HCC)   Chronic kidney disease, stage 3b (HCC)   Elevated coronary artery calcium score   Recurrent syncope: Patient reports longstanding history of syncope, approximately 20+ years, possibly getting worse over time.  She previously had a loop recorder but did not have any episode during the 3 years the battery was good and has had 2 episodes since the battery died.  She has seen Dr. Virl Axe, EP cardiology for this and he felt that this was neurally mediated in the context of her sarcoidosis.  Due to history of DOE, strong family history of CAD, underwent outpatient coronary CTA which was abnormal.  She was supposed to have cardiac cath end of this month but since she presented with recurrent syncope, cardiology consulted and plan cardiac cath on 10/18 at 10 AM to rule out cardiogenic etiology.  Interestingly despite having recurrent syncope, patient reports that she continues to drive and no one thus far has advised her regarding driving restrictions.  Counseled her regarding importance of no driving for 6 months and she verbalized understanding.  TTE: LVEF >75% with hyperdynamic function.  Grade 1 diastolic  dysfunction.  Severe three-vessel CAD. -Coronary CTA showed severe stenosis with lesions in the mLAD and mRCA concerning for 70-99% stenosis -She was planned for outpatient cardiac catheterization but this was performed in the hospital on 10/18. - Cardiac cath results: Severely calcified coronary arteries with multivessel CAD.  Cardiology has consulted thoracic surgery for CABG.   -Cardiothoracic surgery was consulted and planning CABG on 10/20.  Sarcoidosis -Continue Breztri, Albuterol.  Chest x-ray with scarring abnormalities.   HTN -Hold amlodipine/HCTZ for now in the setting of AKI.  Cardiology have added carvedilol 12.5 Mg twice daily. -Will cover with prn IV hydralazine if needed  Hypokalemia Replace and follow.  Magnesium 1.9.   HLD -Continue rosuvastatin   RAS/solitary functioning kidney. -s/p stenting of the R renal artery with chronically occluded L renal artery -She is followed by Dr. Fletcher Anon and is due for repeat imaging in 06/2022   Acute kidney injury complicating stage 3b CKD -Appears slightly worse than prior, but this may be related to progression of CKD and/or RAS -For now, will follow -Attempt to avoid nephrotoxic medications -Presented with creatinine of 1.44 which has normalized to 0.97 >1.07.     Body mass index is 25.2 kg/m.   DVT prophylaxis: enoxaparin (LOVENOX) injection 40 mg Start: 01/03/22 0800 SCD's Start: 01/02/22 1122     Code Status: Full Code:  Family Communication: None at bedside Disposition:  Status is: Inpatient     Consultants:   Cardiology Cardiothoracic surgery  Procedures:   Cardiac cath 10/18  Antimicrobials:      Subjective:  No complaints reported.  No chest pain or palpitations or dyspnea.  Aware of plan for CABG tomorrow.  Objective:   Vitals:   01/03/22 0305 01/03/22 0721 01/03/22 0811 01/03/22 1112  BP: (!) 163/76 (!) 165/72  (!) 148/67  Pulse: 70 73  73  Resp: '16 16  18  '$ Temp: 98 F (36.7 C) 99 F (37.2  C)  98.7 F (37.1 C)  TempSrc: Oral Oral  Oral  SpO2: 97% 96% 97% 100%  Weight:      Height:        General exam: Elderly female, moderately built and frail, lying comfortably propped up in bed without distress. Respiratory system: Clear to auscultation.  No increased work of breathing. Cardiovascular system: S1 & S2 heard, RRR. No JVD, murmurs, rubs, gallops or clicks. No pedal edema.  Telemetry personally reviewed: Sinus rhythm. Gastrointestinal system: Abdomen is nondistended, soft and nontender. No organomegaly or masses felt. Normal bowel sounds heard. Central nervous system: Alert and oriented. No focal neurological deficits. Extremities: Symmetric 5 x 5 power. Skin: No rashes, lesions or ulcers Psychiatry: Judgement and insight appear normal. Mood & affect appropriate.     Data Reviewed:   I have personally reviewed following labs and imaging studies   CBC: Recent Labs  Lab 12/31/21 1106 01/02/22 0338 01/03/22 0320  WBC 10.9* 6.3 6.8  NEUTROABS 6.5  --   --   HGB 14.7 13.1 12.9  HCT 43.7 38.7 39.0  MCV 89.5 90.6 90.7  PLT 321 218 892    Basic Metabolic Panel: Recent Labs  Lab 12/31/21 1106 01/02/22 0338 01/03/22 0320  NA 138 140 138  K 3.2* 3.4* 3.3*  CL 97* 104 105  CO2 '26 24 24  '$ GLUCOSE 140* 96 105*  BUN '21 8 10  '$ CREATININE 1.44* 0.97 1.07*  CALCIUM 10.1 9.8 9.2  MG  --   --  1.9    Liver Function Tests: Recent Labs  Lab 12/31/21 1106  AST 21  ALT 16  ALKPHOS 79  BILITOT 0.5  PROT 7.6  ALBUMIN 4.6    CBG: Recent Labs  Lab 12/31/21 1118  GLUCAP 142*    Microbiology Studies:  No results found for this or any previous visit (from the past 240 hour(s)).  Radiology Studies:  VAS US DOPPLER PRE CABG  Result Date: 01/03/2022 PREOPERATIVE VASCULAR EVALUATION Patient Name:  Kristen Suarez  Date of Exam:   01/03/2022 Medical Rec #: [id number]          Accession #:    [id number] Date of Birth: 1946-02-15          Patient Gender: F  Patient Age:   4 years Exam Location:  [facility] Procedure:      VAS US DOPPLER PRE CABG Referring Phys: DANIEL ENTER --------------------------------------------------------------------------------  Indications:      Pre-CABG. Risk Factors:     Hypertension, hyperlipidemia, coronary artery disease, PAD. Other Factors:    History of right renal artery stenosis s/p renal stent. Limitations:      Patient had right radial cath 01/03/22 and has not been 24                   hours since stent Comparison Study: Prior normal carotid duplex 02/16/21 Performing Technologist: Sharion Dove RVS  Examination Guidelines: A complete evaluation includes B-mode imaging, spectral Doppler, color Doppler, and power Doppler as needed of all accessible portions of each vessel. Bilateral testing is considered an integral part of a complete  examination. Limited examinations for reoccurring indications may be performed as noted.  Right Carotid Findings: +----------+--------+--------+--------+--------+------------------+           PSV cm/sEDV cm/sStenosisDescribeComments           +----------+--------+--------+--------+--------+------------------+ CCA Prox  66      15                                         +----------+--------+--------+--------+--------+------------------+ CCA Distal71      17                      intimal thickening +----------+--------+--------+--------+--------+------------------+ ICA Prox  66      12              calcific                   +----------+--------+--------+--------+--------+------------------+ ICA Mid   71      20                                         +----------+--------+--------+--------+--------+------------------+ ICA Distal77      23                                         +----------+--------+--------+--------+--------+------------------+ ECA       96      10                                          +----------+--------+--------+--------+--------+------------------+ +----------+--------+-------+--------+------------+           PSV cm/sEDV cmsDescribeArm Pressure +----------+--------+-------+--------+------------+ [id number]                                 +----------+--------+-------+--------+------------+ +---------+--------+--+--------+--+ VertebralPSV cm/s53EDV cm/s17 +---------+--------+--+--------+--+ Left Carotid Findings: +----------+--------+--------+--------+--------+------------------+           PSV cm/sEDV cm/sStenosisDescribeComments           +----------+--------+--------+--------+--------+------------------+ CCA Prox  83      17                                         +----------+--------+--------+--------+--------+------------------+ CCA Distal71      15                      intimal thickening +----------+--------+--------+--------+--------+------------------+ ICA Prox  63      17              calcific                   +----------+--------+--------+--------+--------+------------------+ ICA Mid   62      15                                         +----------+--------+--------+--------+--------+------------------+ ICA Distal58      17                                         +----------+--------+--------+--------+--------+------------------+  ECA       92      8                                          +----------+--------+--------+--------+--------+------------------+ +----------+--------+--------+--------+------------+ SubclavianPSV cm/sEDV cm/sDescribeArm Pressure +----------+--------+--------+--------+------------+           125                                  +----------+--------+--------+--------+------------+ +---------+--------+--+--------+-+ VertebralPSV cm/s35EDV cm/s8 +---------+--------+--+--------+-+  ABI Findings: +--------+------------------+-----+-----------+------------------------+ Right   Rt  Pressure (mmHg)IndexWaveform   Comment                  +--------+------------------+-----+-----------+------------------------+ Brachial                       triphasic  post radial cath <24 hrs +--------+------------------+-----+-----------+------------------------+ PTA                            multiphasic                         +--------+------------------+-----+-----------+------------------------+ PERO                           multiphasic                         +--------+------------------+-----+-----------+------------------------+ +--------+------------------+-----+-----------+-------+ Left    Lt Pressure (mmHg)IndexWaveform   Comment +--------+------------------+-----+-----------+-------+ [id number]                    triphasic          +--------+------------------+-----+-----------+-------+ PERO                           multiphasic        +--------+------------------+-----+-----------+-------+ DP                             multiphasic        +--------+------------------+-----+-----------+-------+  Right Doppler Findings: +-----------+--------+-----+---------+------------------------+ Site       PressureIndexDoppler  Comments                 +-----------+--------+-----+---------+------------------------+ Brachial                triphasicpost radial cath <24 hrs +-----------+--------+-----+---------+------------------------+ Radial                  triphasic                         +-----------+--------+-----+---------+------------------------+ Ulnar                   triphasic                         +-----------+--------+-----+---------+------------------------+ Palmar Arch                      post radial cath <24 hrs +-----------+--------+-----+---------+------------------------+  Left Doppler Findings: +--------+--------+-----+---------+--------+ Site    PressureIndexDoppler  Comments  +--------+--------+-----+---------+--------+ [id number]          triphasic         +--------+--------+-----+---------+--------+  Radial               triphasic         +--------+--------+-----+---------+--------+ Ulnar                triphasic         +--------+--------+-----+---------+--------+   Summary: Right Carotid: The extracranial vessels were near-normal with only minimal wall                thickening or plaque. Left Carotid: The extracranial vessels were near-normal with only minimal wall               thickening or plaque. Vertebrals:  Bilateral vertebral arteries demonstrate antegrade flow. Subclavians: Normal flow hemodynamics were seen in bilateral subclavian              arteries. Left Upper Extremity: Doppler waveforms remain within normal limits with left radial compression. Doppler waveforms decrease <50% with left ulnar compression.  Electronically signed by Orlie Pollen on 01/03/2022 at 12:26:07 PM.    Final    CARDIAC CATHETERIZATION  Result Date: 01/02/2022   Prox Cx to Mid Cx lesion is 70% stenosed.   1st Mrg lesion is 85% stenosed.   2nd Mrg lesion is 80% stenosed.   Mid LM to Dist LM lesion is 20% stenosed.   Ost LAD to Mid LAD lesion is 60% stenosed.   1st Diag lesion is 90% stenosed.   Mid LAD lesion is 80% stenosed.   Prox RCA-1 lesion is 80% stenosed.   Prox RCA-2 lesion is 95% stenosed.   Mid RCA lesion is 70% stenosed.   Mid RCA to Dist RCA lesion is 80% stenosed.   3rd RPL lesion is 80% stenosed. Severely calcified coronary arteries with multivessel CAD with calcification extending into the distal left main, diffuse 90% first diagonal stenosis, 60 to 80% proximal to mid LAD stenoses; left circumflex stenoses of 85% in a moderate-sized first marginal,70% in the circumflex before the OM 2 with 80% OM 2 stenosis; and severely calcified diffuse disease in the proximal extending into the mid distal RCA with 80% focal PLA stenosis. LVEDP 22 mmHg. RECOMMENDATION:  Surgical consultation for CABG revascularization surgery.   ECHOCARDIOGRAM COMPLETE  Result Date: 01/01/2022    ECHOCARDIOGRAM REPORT   Patient Name:   THERSEA MANFREDONIA Date of Exam: 01/01/2022 Medical Rec #:  [id number]         Height:       62.0 in Accession #:    [id number]        Weight:       138.0 lb Date of Birth:  [birth year]         BSA:          1.633 m Patient Age:    89 years          BP:           153/97 mmHg Patient Gender: F                 HR:           82 bpm. Exam Location:  Inpatient Procedure: 2D Echo, Color Doppler and Cardiac Doppler Indications:    Stroke i63.9  History:        Patient has prior history of Echocardiogram examinations, most                 recent 02/16/2021. CAD, COPD; Risk Factors:Hypertension and  Dyslipidemia.  Sonographer:    Raquel Sarna Senior RDCS Referring Phys: [id number]  1. Left ventricular ejection fraction, by estimation, is >75%. The left ventricle has hyperdynamic function. The left ventricle has no regional wall motion abnormalities. Left ventricular diastolic parameters are consistent with Grade I diastolic dysfunction (impaired relaxation).  2. Right ventricular systolic function is mildly reduced. The right ventricular size is normal.  3. The mitral valve is abnormal. Trivial mitral valve regurgitation.  4. The aortic valve is tricuspid. Aortic valve regurgitation is not visualized.  5. The inferior vena cava is normal in size with greater than 50% respiratory variability, suggesting right atrial pressure of 3 mmHg.  6. Cannot exclude a small PFO. Comparison(s): Changes from prior study are noted. 02/16/2021: LVEF 60-65%. FINDINGS  Left Ventricle: Left ventricular ejection fraction, by estimation, is >75%. The left ventricle has hyperdynamic function. The left ventricle has no regional wall motion abnormalities. The left ventricular internal cavity size was normal in size. There is no left ventricular hypertrophy. Left  ventricular diastolic parameters are consistent with Grade I diastolic dysfunction (impaired relaxation). Indeterminate filling pressures. Right Ventricle: The right ventricular size is normal. No increase in right ventricular wall thickness. Right ventricular systolic function is mildly reduced. Left Atrium: Left atrial size was normal in size. Right Atrium: Right atrial size was normal in size. Pericardium: There is no evidence of pericardial effusion. Mitral Valve: The mitral valve is abnormal. There is mild calcification of the mitral valve leaflet(s). Trivial mitral valve regurgitation. Tricuspid Valve: The tricuspid valve is grossly normal. Tricuspid valve regurgitation is trivial. Aortic Valve: The aortic valve is tricuspid. Aortic valve regurgitation is not visualized. Pulmonic Valve: The pulmonic valve was normal in structure. Pulmonic valve regurgitation is not visualized. Aorta: The aortic root and ascending aorta are structurally normal, with no evidence of dilitation. Venous: The inferior vena cava is normal in size with greater than 50% respiratory variability, suggesting right atrial pressure of 3 mmHg. IAS/Shunts: Cannot exclude a small PFO.  LEFT VENTRICLE PLAX 2D LVIDd:         4.45 cm   Diastology LVIDs:         2.10 cm   LV e' medial:    6.42 cm/s LV PW:         0.75 cm   LV E/e' medial:  10.2 LV IVS:        0.70 cm   LV e' lateral:   6.20 cm/s LVOT diam:     2.00 cm   LV E/e' lateral: 10.6 LV SV:         85 LV SV Index:   52 LVOT Area:     3.14 cm  RIGHT VENTRICLE RV S prime:     8.16 cm/s TAPSE (M-mode): 1.8 cm LEFT ATRIUM             Index        RIGHT ATRIUM           Index LA diam:        3.00 cm 1.84 cm/m   RA Area:     10.30 cm LA Vol (A2C):   28.4 ml 17.39 ml/m  RA Volume:   19.10 ml  11.70 ml/m LA Vol (A4C):   32.0 ml 19.60 ml/m LA Biplane Vol: 32.5 ml 19.90 ml/m  AORTIC VALVE LVOT Vmax:   132.00 cm/s LVOT Vmean:  97.800 cm/s LVOT VTI:    0.272 m  AORTA Ao Root diam: 2.50 cm  Ao  Asc diam:  3.50 cm MITRAL VALVE MV Area (PHT): 2.27 cm    SHUNTS MV Decel Time: 334 msec    Systemic VTI:  0.27 m MV E velocity: 65.70 cm/s  Systemic Diam: 2.00 cm MV A velocity: 85.40 cm/s MV E/A ratio:  0.77 Lyman Bishop MD Electronically signed by Lyman Bishop MD Signature Date/Time: 01/01/2022/7:16:14 PM    Final     Scheduled Meds:    amLODipine  10 mg Oral Daily   aspirin EC  81 mg Oral Daily   carvedilol  12.5 mg Oral BID WC   enoxaparin (LOVENOX) injection  40 mg Subcutaneous Q24H   [START ON 01/04/2022] epinephrine  0-10 mcg/min Intravenous To OR   fluticasone  2 spray Each Nare Daily   [START ON 01/04/2022] heparin sodium (porcine) 2,500 Units, papaverine 30 mg in electrolyte-A (PLASMALYTE-A PH 7.4) 500 mL irrigation   Irrigation To OR   [START ON 01/04/2022] insulin   Intravenous To OR   [START ON 01/04/2022] [place] Blood Cardioplegia vial (lidocaine/magnesium/mannitol 0.26g-4g-6.4g)   Intracoronary To OR   mometasone-formoterol  2 puff Inhalation BID   pantoprazole  40 mg Oral Daily   [START ON 01/04/2022] phenylephrine  30-200 mcg/min Intravenous To OR   [START ON 01/04/2022] potassium chloride  80 mEq Other To OR   rosuvastatin  40 mg Oral Daily   sodium chloride flush  3 mL Intravenous Q12H   [START ON 01/04/2022] tranexamic acid  15 mg/kg Intravenous To OR   [START ON 01/04/2022] tranexamic acid  2 mg/kg Intracatheter To OR   umeclidinium bromide  1 puff Inhalation Daily    Continuous Infusions:    [START ON 01/04/2022] dexmedetomidine     [START ON 01/04/2022] heparin 30,000 units/NS 1000 mL solution for CELLSAVER     [START ON 01/04/2022] levofloxacin (LEVAQUIN) IV     [START ON 01/04/2022] milrinone     [START ON 01/04/2022] norepinephrine     [START ON 01/04/2022] tranexamic acid (CYKLOKAPRON) 2,500 mg in sodium chloride 0.9 % 250 mL (10 mg/mL) infusion     [START ON 01/04/2022] vancomycin       LOS: 0 days     Vernell Leep, MD,  FACP, FHM, SFHM,  [facility], [place]     To contact the attending provider between 7A-7P or the covering provider during after hours 7P-7A, please log into the web site [email or web address] and access using universal [place] password for that web site. If you do not have the password, please call the hospital operator.  01/03/2022, 2:31 PM

## 2022-01-03 NOTE — Progress Notes (Signed)
VASCULAR LAB    Pre CABG Dopplers have been performed.  See CV proc for preliminary results.   Leeyah Heather, RVT 01/03/2022, 9:20 AM

## 2022-01-03 NOTE — TOC Initial Note (Signed)
Transition of Care [facility]) - Initial/Assessment Note    Patient Details  Name: Kristen Suarez MRN: [id number] Date of Birth: [birth year]  [facility] CM/SW Contact:    Pollie Friar, RN Phone Number: 01/03/2022, 8:14 AM  Clinical Narrative:                 Pt is from home with her spouse. She states he is with her most of the time.  Spouse does the transportation.  Pt manages her own medications and denies any issues.  Awaiting CVTS to see about surgery.  TOC following.  Expected Discharge Plan: Home/Self Care Barriers to Discharge: Continued Medical Work up   Patient Goals and CMS Choice        Expected Discharge Plan and Services Expected Discharge Plan: Home/Self Care       Living arrangements for the past 2 months: Single Family Home                                      Prior Living Arrangements/Services Living arrangements for the past 2 months: Single Family Home Lives with:: Spouse Patient language and need for interpreter reviewed:: Yes Do you feel safe going back to the place where you live?: Yes        Care giver support system in place?: Yes (comment) Current home services: DME (cane) Criminal Activity/Legal Involvement Pertinent to Current Situation/Hospitalization: No - Comment as needed  Activities of Daily Living Home Assistive Devices/Equipment: None ADL Screening (condition at time of admission) Patient's cognitive ability adequate to safely complete daily activities?: Yes Is the patient deaf or have difficulty hearing?: No Does the patient have difficulty seeing, even when wearing glasses/contacts?: No Does the patient have difficulty concentrating, remembering, or making decisions?: No Patient able to express need for assistance with ADLs?: Yes Does the patient have difficulty dressing or bathing?: No Independently performs ADLs?: Yes (appropriate for developmental age) Does the patient have difficulty walking or  climbing stairs?: No Weakness of Legs: Both Weakness of Arms/Hands: None  Permission Sought/Granted                  Emotional Assessment Appearance:: Appears stated age Attitude/Demeanor/Rapport: Engaged Affect (typically observed): Accepting Orientation: : Oriented to Self, Oriented to Place, Oriented to  Time, Oriented to Situation   Psych Involvement: No (comment)  Admission diagnosis:  Syncope [R55] Syncope, unspecified syncope type [R55] Patient Active Problem List   Diagnosis Date Noted   Elevated coronary artery calcium score    Chronic kidney disease, stage 3b ([place]) 01/01/2022   Syncope 12/31/2021   Renal artery stenosis (HCC) 09/25/2021   Aortic valve sclerosis 09/25/2021   Snoring 04/13/2021   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   Epistaxis 03/29/2021   Coronary artery disease due to calcified coronary lesion 08/15/2020   Basal cell carcinoma 06/30/2020   History of loop recorder 12/13/2019   Allergic rhinitis 12/08/2019   Cough 11/08/2019   Post-nasal drip 11/08/2019   Multiple joint pain 04/22/2019   Bilateral leg edema 04/22/2019   Vaginal atrophy 01/05/2019   Resting tremor 09/08/2018   Sarcoidosis 05/08/2018   HTN (hypertension) 05/08/2018   Colon polyp 05/08/2018   Family hx of colon cancer 05/08/2018   Cystocele, midline 05/08/2018   Hyperlipidemia 05/08/2018   Vasovagal syncope 05/08/2018   Seborrheic keratosis 05/08/2018   Pseudophakia of both eyes 05/08/2018   GERD (gastroesophageal reflux disease)  05/08/2018   COPD (chronic obstructive pulmonary disease) ([place]) 05/08/2018   PCP:  Flossie Buffy, NP Pharmacy:   [id number] [id number]: [phone]: [id number]: [phone]: [phone]    Social Determinants of Health (SDOH)  Interventions    Readmission Risk Interventions     No data to display

## 2022-01-04 ENCOUNTER — Inpatient Hospital Stay (HOSPITAL_COMMUNITY): Payer: Medicare Other

## 2022-01-04 ENCOUNTER — Inpatient Hospital Stay (HOSPITAL_COMMUNITY)
Admission: EM | Disposition: A | Discharge status: Home Health | Payer: Self-pay | Source: Home / Self Care | Attending: Cardiothoracic Surgery

## 2022-01-04 ENCOUNTER — Inpatient Hospital Stay (HOSPITAL_COMMUNITY): Payer: Medicare Other | Admitting: Certified Registered Nurse Anesthetist

## 2022-01-04 ENCOUNTER — Other Ambulatory Visit: Payer: Self-pay

## 2022-01-04 DIAGNOSIS — I10 Essential (primary) hypertension: Secondary | ICD-10-CM

## 2022-01-04 DIAGNOSIS — J449 Chronic obstructive pulmonary disease, unspecified: Secondary | ICD-10-CM

## 2022-01-04 DIAGNOSIS — N289 Disorder of kidney and ureter, unspecified: Secondary | ICD-10-CM | POA: Diagnosis not present

## 2022-01-04 DIAGNOSIS — Z7722 Contact with and (suspected) exposure to environmental tobacco smoke (acute) (chronic): Secondary | ICD-10-CM

## 2022-01-04 DIAGNOSIS — I4891 Unspecified atrial fibrillation: Secondary | ICD-10-CM

## 2022-01-04 DIAGNOSIS — I2510 Atherosclerotic heart disease of native coronary artery without angina pectoris: Secondary | ICD-10-CM | POA: Diagnosis not present

## 2022-01-04 DIAGNOSIS — Z951 Presence of aortocoronary bypass graft: Secondary | ICD-10-CM

## 2022-01-04 HISTORY — PX: CLIPPING OF ATRIAL APPENDAGE: SHX5773

## 2022-01-04 HISTORY — PX: CORONARY ARTERY BYPASS GRAFT: SHX141

## 2022-01-04 HISTORY — PX: TEE WITHOUT CARDIOVERSION: SHX5443

## 2022-01-04 LAB — CBC
HCT: 37.8 % (ref 36.0–46.0)
HCT: 34.7 % — ABNORMAL LOW (ref 36.0–46.0)
HCT: 32.4 % — ABNORMAL LOW (ref 36.0–46.0)
Hemoglobin: 12.6 g/dL (ref 12.0–15.0)
Hemoglobin: 11.6 g/dL — ABNORMAL LOW (ref 12.0–15.0)
Hemoglobin: 10.9 g/dL — ABNORMAL LOW (ref 12.0–15.0)
MCH: 30.4 pg (ref 26.0–34.0)
MCH: 30.3 pg (ref 26.0–34.0)
MCH: 30.7 pg (ref 26.0–34.0)
MCHC: 33.3 g/dL (ref 30.0–36.0)
MCHC: 33.4 g/dL (ref 30.0–36.0)
MCHC: 33.6 g/dL (ref 30.0–36.0)
MCV: 91.1 fL (ref 80.0–100.0)
MCV: 90.6 fL (ref 80.0–100.0)
MCV: 91.3 fL (ref 80.0–100.0)
Platelets: 231 10*3/uL (ref 150–400)
Platelets: 134 10*3/uL — ABNORMAL LOW (ref 150–400)
Platelets: 173 10*3/uL (ref 150–400)
RBC: 4.15 MIL/uL (ref 3.87–5.11)
RBC: 3.83 MIL/uL — ABNORMAL LOW (ref 3.87–5.11)
RBC: 3.55 MIL/uL — ABNORMAL LOW (ref 3.87–5.11)
RDW: 13 % (ref 11.5–15.5)
RDW: 13.3 % (ref 11.5–15.5)
RDW: 13.6 % (ref 11.5–15.5)
WBC: 7 10*3/uL (ref 4.0–10.5)
WBC: 17 10*3/uL — ABNORMAL HIGH (ref 4.0–10.5)
WBC: 22.1 10*3/uL — ABNORMAL HIGH (ref 4.0–10.5)
nRBC: 0 % (ref 0.0–0.2)
nRBC: 0 % (ref 0.0–0.2)
nRBC: 0 % (ref 0.0–0.2)

## 2022-01-04 LAB — POCT I-STAT, CHEM 8
BUN: 14 mg/dL (ref 8–23)
BUN: 12 mg/dL (ref 8–23)
BUN: 13 mg/dL (ref 8–23)
BUN: 14 mg/dL (ref 8–23)
BUN: 12 mg/dL (ref 8–23)
BUN: 11 mg/dL (ref 8–23)
Calcium, Ion: 1.23 mmol/L (ref 1.15–1.40)
Calcium, Ion: 0.94 mmol/L — ABNORMAL LOW (ref 1.15–1.40)
Calcium, Ion: 1.23 mmol/L (ref 1.15–1.40)
Calcium, Ion: 0.96 mmol/L — ABNORMAL LOW (ref 1.15–1.40)
Calcium, Ion: 0.89 mmol/L — CL (ref 1.15–1.40)
Calcium, Ion: 2.2 mmol/L (ref 1.15–1.40)
Chloride: 104 mmol/L (ref 98–111)
Chloride: 103 mmol/L (ref 98–111)
Chloride: 104 mmol/L (ref 98–111)
Chloride: 103 mmol/L (ref 98–111)
Chloride: 102 mmol/L (ref 98–111)
Chloride: 110 mmol/L (ref 98–111)
Creatinine, Ser: 0.9 mg/dL (ref 0.44–1.00)
Creatinine, Ser: 0.6 mg/dL (ref 0.44–1.00)
Creatinine, Ser: 0.8 mg/dL (ref 0.44–1.00)
Creatinine, Ser: 0.7 mg/dL (ref 0.44–1.00)
Creatinine, Ser: 0.6 mg/dL (ref 0.44–1.00)
Creatinine, Ser: 0.6 mg/dL (ref 0.44–1.00)
Glucose, Bld: 105 mg/dL — ABNORMAL HIGH (ref 70–99)
Glucose, Bld: 103 mg/dL — ABNORMAL HIGH (ref 70–99)
Glucose, Bld: 86 mg/dL (ref 70–99)
Glucose, Bld: 118 mg/dL — ABNORMAL HIGH (ref 70–99)
Glucose, Bld: 117 mg/dL — ABNORMAL HIGH (ref 70–99)
Glucose, Bld: 114 mg/dL — ABNORMAL HIGH (ref 70–99)
HCT: 37 % (ref 36.0–46.0)
HCT: 22 % — ABNORMAL LOW (ref 36.0–46.0)
HCT: 30 % — ABNORMAL LOW (ref 36.0–46.0)
HCT: 25 % — ABNORMAL LOW (ref 36.0–46.0)
HCT: 26 % — ABNORMAL LOW (ref 36.0–46.0)
HCT: 27 % — ABNORMAL LOW (ref 36.0–46.0)
Hemoglobin: 12.6 g/dL (ref 12.0–15.0)
Hemoglobin: 10.2 g/dL — ABNORMAL LOW (ref 12.0–15.0)
Hemoglobin: 7.5 g/dL — ABNORMAL LOW (ref 12.0–15.0)
Hemoglobin: 8.5 g/dL — ABNORMAL LOW (ref 12.0–15.0)
Hemoglobin: 8.8 g/dL — ABNORMAL LOW (ref 12.0–15.0)
Hemoglobin: 9.2 g/dL — ABNORMAL LOW (ref 12.0–15.0)
Potassium: 3.9 mmol/L (ref 3.5–5.1)
Potassium: 3.8 mmol/L (ref 3.5–5.1)
Potassium: 4.6 mmol/L (ref 3.5–5.1)
Potassium: 4.8 mmol/L (ref 3.5–5.1)
Potassium: 5 mmol/L (ref 3.5–5.1)
Potassium: 4.4 mmol/L (ref 3.5–5.1)
Sodium: 140 mmol/L (ref 135–145)
Sodium: 138 mmol/L (ref 135–145)
Sodium: 139 mmol/L (ref 135–145)
Sodium: 140 mmol/L (ref 135–145)
Sodium: 139 mmol/L (ref 135–145)
Sodium: 138 mmol/L (ref 135–145)
TCO2: 25 mmol/L (ref 22–32)
TCO2: 26 mmol/L (ref 22–32)
TCO2: 27 mmol/L (ref 22–32)
TCO2: 24 mmol/L (ref 22–32)
TCO2: 25 mmol/L (ref 22–32)
TCO2: 23 mmol/L (ref 22–32)

## 2022-01-04 LAB — POCT I-STAT 7, (LYTES, BLD GAS, ICA,H+H)
Acid-Base Excess: 1 mmol/L (ref 0.0–2.0)
Acid-Base Excess: 3 mmol/L — ABNORMAL HIGH (ref 0.0–2.0)
Acid-Base Excess: 2 mmol/L (ref 0.0–2.0)
Acid-Base Excess: 3 mmol/L — ABNORMAL HIGH (ref 0.0–2.0)
Acid-Base Excess: 0 mmol/L (ref 0.0–2.0)
Acid-Base Excess: 1 mmol/L (ref 0.0–2.0)
Acid-base deficit: 1 mmol/L (ref 0.0–2.0)
Acid-base deficit: 4 mmol/L — ABNORMAL HIGH (ref 0.0–2.0)
Bicarbonate: 26.2 mmol/L (ref 20.0–28.0)
Bicarbonate: 26.3 mmol/L (ref 20.0–28.0)
Bicarbonate: 25 mmol/L (ref 20.0–28.0)
Bicarbonate: 25.1 mmol/L (ref 20.0–28.0)
Bicarbonate: 24.6 mmol/L (ref 20.0–28.0)
Bicarbonate: 24.8 mmol/L (ref 20.0–28.0)
Bicarbonate: 23.3 mmol/L (ref 20.0–28.0)
Bicarbonate: 22.1 mmol/L (ref 20.0–28.0)
Calcium, Ion: 1.26 mmol/L (ref 1.15–1.40)
Calcium, Ion: 0.95 mmol/L — ABNORMAL LOW (ref 1.15–1.40)
Calcium, Ion: 0.91 mmol/L — ABNORMAL LOW (ref 1.15–1.40)
Calcium, Ion: 0.94 mmol/L — ABNORMAL LOW (ref 1.15–1.40)
Calcium, Ion: 0.86 mmol/L — CL (ref 1.15–1.40)
Calcium, Ion: 0.92 mmol/L — ABNORMAL LOW (ref 1.15–1.40)
Calcium, Ion: 2.21 mmol/L (ref 1.15–1.40)
Calcium, Ion: 1.15 mmol/L (ref 1.15–1.40)
HCT: 35 % — ABNORMAL LOW (ref 36.0–46.0)
HCT: 22 % — ABNORMAL LOW (ref 36.0–46.0)
HCT: 21 % — ABNORMAL LOW (ref 36.0–46.0)
HCT: 23 % — ABNORMAL LOW (ref 36.0–46.0)
HCT: 21 % — ABNORMAL LOW (ref 36.0–46.0)
HCT: 25 % — ABNORMAL LOW (ref 36.0–46.0)
HCT: 23 % — ABNORMAL LOW (ref 36.0–46.0)
HCT: 33 % — ABNORMAL LOW (ref 36.0–46.0)
Hemoglobin: 11.9 g/dL — ABNORMAL LOW (ref 12.0–15.0)
Hemoglobin: 7.5 g/dL — ABNORMAL LOW (ref 12.0–15.0)
Hemoglobin: 7.1 g/dL — ABNORMAL LOW (ref 12.0–15.0)
Hemoglobin: 7.8 g/dL — ABNORMAL LOW (ref 12.0–15.0)
Hemoglobin: 7.1 g/dL — ABNORMAL LOW (ref 12.0–15.0)
Hemoglobin: 8.5 g/dL — ABNORMAL LOW (ref 12.0–15.0)
Hemoglobin: 7.8 g/dL — ABNORMAL LOW (ref 12.0–15.0)
Hemoglobin: 11.2 g/dL — ABNORMAL LOW (ref 12.0–15.0)
O2 Saturation: 100 %
O2 Saturation: 100 %
O2 Saturation: 100 %
O2 Saturation: 100 %
O2 Saturation: 100 %
O2 Saturation: 100 %
O2 Saturation: 100 %
O2 Saturation: 99 %
Patient temperature: 35.9
Potassium: 3.8 mmol/L (ref 3.5–5.1)
Potassium: 4.1 mmol/L (ref 3.5–5.1)
Potassium: 4.7 mmol/L (ref 3.5–5.1)
Potassium: 4.7 mmol/L (ref 3.5–5.1)
Potassium: 4.7 mmol/L (ref 3.5–5.1)
Potassium: 4.7 mmol/L (ref 3.5–5.1)
Potassium: 4.3 mmol/L (ref 3.5–5.1)
Potassium: 3.8 mmol/L (ref 3.5–5.1)
Sodium: 139 mmol/L (ref 135–145)
Sodium: 139 mmol/L (ref 135–145)
Sodium: 139 mmol/L (ref 135–145)
Sodium: 139 mmol/L (ref 135–145)
Sodium: 138 mmol/L (ref 135–145)
Sodium: 140 mmol/L (ref 135–145)
Sodium: 139 mmol/L (ref 135–145)
Sodium: 141 mmol/L (ref 135–145)
TCO2: 28 mmol/L (ref 22–32)
TCO2: 27 mmol/L (ref 22–32)
TCO2: 26 mmol/L (ref 22–32)
TCO2: 26 mmol/L (ref 22–32)
TCO2: 26 mmol/L (ref 22–32)
TCO2: 26 mmol/L (ref 22–32)
TCO2: 24 mmol/L (ref 22–32)
TCO2: 23 mmol/L (ref 22–32)
pCO2 arterial: 42.7 mmHg (ref 32–48)
pCO2 arterial: 33.4 mmHg (ref 32–48)
pCO2 arterial: 28 mmHg — ABNORMAL LOW (ref 32–48)
pCO2 arterial: 31.1 mmHg — ABNORMAL LOW (ref 32–48)
pCO2 arterial: 35.1 mmHg (ref 32–48)
pCO2 arterial: 37 mmHg (ref 32–48)
pCO2 arterial: 34.9 mmHg (ref 32–48)
pCO2 arterial: 42.2 mmHg (ref 32–48)
pH, Arterial: 7.396 (ref 7.35–7.45)
pH, Arterial: 7.504 — ABNORMAL HIGH (ref 7.35–7.45)
pH, Arterial: 7.515 — ABNORMAL HIGH (ref 7.35–7.45)
pH, Arterial: 7.558 — ABNORMAL HIGH (ref 7.35–7.45)
pH, Arterial: 7.43 (ref 7.35–7.45)
pH, Arterial: 7.457 — ABNORMAL HIGH (ref 7.35–7.45)
pH, Arterial: 7.432 (ref 7.35–7.45)
pH, Arterial: 7.32 — ABNORMAL LOW (ref 7.35–7.45)
pO2, Arterial: 449 mmHg — ABNORMAL HIGH (ref 83–108)
pO2, Arterial: 411 mmHg — ABNORMAL HIGH (ref 83–108)
pO2, Arterial: 429 mmHg — ABNORMAL HIGH (ref 83–108)
pO2, Arterial: 455 mmHg — ABNORMAL HIGH (ref 83–108)
pO2, Arterial: 361 mmHg — ABNORMAL HIGH (ref 83–108)
pO2, Arterial: 384 mmHg — ABNORMAL HIGH (ref 83–108)
pO2, Arterial: 274 mmHg — ABNORMAL HIGH (ref 83–108)
pO2, Arterial: 168 mmHg — ABNORMAL HIGH (ref 83–108)

## 2022-01-04 LAB — COOXEMETRY PANEL
Carboxyhemoglobin: 2.2 % — ABNORMAL HIGH (ref 0.5–1.5)
Methemoglobin: 0.9 % (ref 0.0–1.5)
O2 Saturation: 56.6 %
Total hemoglobin: 11.5 g/dL — ABNORMAL LOW (ref 12.0–16.0)

## 2022-01-04 LAB — BASIC METABOLIC PANEL
Anion gap: 8 (ref 5–15)
Anion gap: 4 — ABNORMAL LOW (ref 5–15)
BUN: 15 mg/dL (ref 8–23)
BUN: 12 mg/dL (ref 8–23)
CO2: 25 mmol/L (ref 22–32)
CO2: 22 mmol/L (ref 22–32)
Calcium: 9.1 mg/dL (ref 8.9–10.3)
Calcium: 7.3 mg/dL — ABNORMAL LOW (ref 8.9–10.3)
Chloride: 105 mmol/L (ref 98–111)
Chloride: 114 mmol/L — ABNORMAL HIGH (ref 98–111)
Creatinine, Ser: 1.09 mg/dL — ABNORMAL HIGH (ref 0.44–1.00)
Creatinine, Ser: 0.94 mg/dL (ref 0.44–1.00)
GFR, Estimated: 53 mL/min — ABNORMAL LOW (ref >60)
GFR, Estimated: >60 mL/min (ref >60)
Glucose, Bld: 108 mg/dL — ABNORMAL HIGH (ref 70–99)
Glucose, Bld: 133 mg/dL — ABNORMAL HIGH (ref 70–99)
Potassium: 3.8 mmol/L (ref 3.5–5.1)
Potassium: 5.4 mmol/L — ABNORMAL HIGH (ref 3.5–5.1)
Sodium: 138 mmol/L (ref 135–145)
Sodium: 140 mmol/L (ref 135–145)

## 2022-01-04 LAB — PROTIME-INR
INR: 1.6 — ABNORMAL HIGH (ref 0.8–1.2)
Prothrombin Time: 18.8 seconds — ABNORMAL HIGH (ref 11.4–15.2)

## 2022-01-04 LAB — SURGICAL PCR SCREEN
MRSA, PCR: NEGATIVE
Staphylococcus aureus: POSITIVE — AB

## 2022-01-04 LAB — HEMOGLOBIN AND HEMATOCRIT, BLOOD
HCT: 24.6 % — ABNORMAL LOW (ref 36.0–46.0)
Hemoglobin: 8.5 g/dL — ABNORMAL LOW (ref 12.0–15.0)

## 2022-01-04 LAB — GLUCOSE, CAPILLARY
Glucose-Capillary: 147 mg/dL — ABNORMAL HIGH (ref 70–99)
Glucose-Capillary: 174 mg/dL — ABNORMAL HIGH (ref 70–99)
Glucose-Capillary: 143 mg/dL — ABNORMAL HIGH (ref 70–99)
Glucose-Capillary: 121 mg/dL — ABNORMAL HIGH (ref 70–99)
Glucose-Capillary: 131 mg/dL — ABNORMAL HIGH (ref 70–99)
Glucose-Capillary: 146 mg/dL — ABNORMAL HIGH (ref 70–99)

## 2022-01-04 LAB — MAGNESIUM: Magnesium: 3.4 mg/dL — ABNORMAL HIGH (ref 1.7–2.4)

## 2022-01-04 LAB — APTT: aPTT: 35 seconds (ref 24–36)

## 2022-01-04 LAB — LIPOPROTEIN A (LPA): Lipoprotein (a): 285 nmol/L — ABNORMAL HIGH (ref <75.0)

## 2022-01-04 LAB — PLATELET COUNT: Platelets: 132 10*3/uL — ABNORMAL LOW (ref 150–400)

## 2022-01-04 LAB — PREPARE RBC (CROSSMATCH)

## 2022-01-04 SURGERY — CORONARY ARTERY BYPASS GRAFTING (CABG)
Anesthesia: General | Site: Chest

## 2022-01-04 MED ORDER — SODIUM CHLORIDE 0.9 % IV SOLN: INTRAVENOUS | Status: DC

## 2022-01-04 MED ORDER — FENTANYL CITRATE (PF) 250 MCG/5ML IJ SOLN
INTRAMUSCULAR | Status: AC
  Filled 2022-01-04: qty 5

## 2022-01-04 MED ORDER — METOPROLOL TARTRATE 25 MG/10 ML ORAL SUSPENSION: 12.5000 mg | Freq: Two times a day (BID) | ORAL | Status: DC

## 2022-01-04 MED ORDER — SODIUM CHLORIDE (PF) 0.9 % IJ SOLN
INTRAMUSCULAR | Status: AC
  Filled 2022-01-04: qty 10

## 2022-01-04 MED ORDER — 0.9 % SODIUM CHLORIDE (POUR BTL) OPTIME
TOPICAL | Status: DC | PRN
  Administered 2022-01-04: 5000 mL

## 2022-01-04 MED ORDER — BUPIVACAINE LIPOSOME 1.3 % IJ SUSP
INTRAMUSCULAR | Status: DC | PRN
  Administered 2022-01-04: 50 mL

## 2022-01-04 MED ORDER — LACTATED RINGERS IV SOLN: INTRAVENOUS | Status: DC

## 2022-01-04 MED ORDER — ACETAMINOPHEN 160 MG/5ML PO SOLN: 650.0000 mg | Freq: Once | ORAL | Status: DC

## 2022-01-04 MED ORDER — LACTATED RINGERS IV SOLN: 500.0000 mL | Freq: Once | INTRAVENOUS | Status: DC | PRN

## 2022-01-04 MED ORDER — CHLORHEXIDINE GLUCONATE CLOTH 2 % EX PADS
6.0000 | MEDICATED_PAD | Freq: Every day | CUTANEOUS | Status: DC
  Administered 2022-01-05 – 2022-01-08 (×4): 6 via TOPICAL

## 2022-01-04 MED ORDER — HEMOSTATIC AGENTS (NO CHARGE) OPTIME
TOPICAL | Status: DC | PRN
  Administered 2022-01-04: 1 via TOPICAL

## 2022-01-04 MED ORDER — VANCOMYCIN HCL IN DEXTROSE 1-5 GM/200ML-% IV SOLN
1000.0000 mg | Freq: Once | INTRAVENOUS | Status: AC
  Administered 2022-01-04: 1000 mg via INTRAVENOUS
  Filled 2022-01-04: qty 200

## 2022-01-04 MED ORDER — PHENYLEPHRINE 80 MCG/ML (10ML) SYRINGE FOR IV PUSH (FOR BLOOD PRESSURE SUPPORT)
PREFILLED_SYRINGE | INTRAVENOUS | Status: DC | PRN
  Administered 2022-01-04 (×2): 40 ug via INTRAVENOUS
  Administered 2022-01-04 (×3): 80 ug via INTRAVENOUS
  Administered 2022-01-04: 40 ug via INTRAVENOUS

## 2022-01-04 MED ORDER — ACETAMINOPHEN 160 MG/5ML PO SOLN: 1000.0000 mg | Freq: Four times a day (QID) | ORAL | Status: DC

## 2022-01-04 MED ORDER — ONDANSETRON HCL 4 MG/2ML IJ SOLN
4.0000 mg | Freq: Four times a day (QID) | INTRAMUSCULAR | Status: DC | PRN
  Administered 2022-01-04 – 2022-01-07 (×6): 4 mg via INTRAVENOUS
  Filled 2022-01-04 (×6): qty 2

## 2022-01-04 MED ORDER — SODIUM CHLORIDE 0.9 % IV SOLN: INTRAVENOUS | Status: DC | PRN

## 2022-01-04 MED ORDER — PROTAMINE SULFATE 10 MG/ML IV SOLN
INTRAVENOUS | Status: AC
  Filled 2022-01-04: qty 25

## 2022-01-04 MED ORDER — LACTATED RINGERS IV SOLN: INTRAVENOUS | Status: DC | PRN

## 2022-01-04 MED ORDER — MORPHINE SULFATE (PF) 2 MG/ML IV SOLN
1.0000 mg | INTRAVENOUS | Status: DC | PRN
  Administered 2022-01-05: 1 mg via INTRAVENOUS
  Administered 2022-01-05: 2 mg via INTRAVENOUS
  Administered 2022-01-05: 1 mg via INTRAVENOUS
  Administered 2022-01-05: 2 mg via INTRAVENOUS
  Filled 2022-01-04 (×2): qty 2

## 2022-01-04 MED ORDER — METOPROLOL TARTRATE 5 MG/5ML IV SOLN
2.5000 mg | INTRAVENOUS | Status: DC | PRN
  Administered 2022-01-07: 2.5 mg via INTRAVENOUS
  Filled 2022-01-04: qty 5

## 2022-01-04 MED ORDER — OXYCODONE HCL 5 MG PO TABS
5.0000 mg | ORAL_TABLET | ORAL | Status: DC | PRN
  Administered 2022-01-05 – 2022-01-08 (×3): 5 mg via ORAL
  Filled 2022-01-04 (×3): qty 1

## 2022-01-04 MED ORDER — PROPOFOL 10 MG/ML IV BOLUS
INTRAVENOUS | Status: AC
  Filled 2022-01-04: qty 20

## 2022-01-04 MED ORDER — PROTAMINE SULFATE 10 MG/ML IV SOLN
INTRAVENOUS | Status: DC | PRN
  Administered 2022-01-04: 230 mg via INTRAVENOUS

## 2022-01-04 MED ORDER — ROCURONIUM BROMIDE 10 MG/ML (PF) SYRINGE
PREFILLED_SYRINGE | INTRAVENOUS | Status: AC
  Filled 2022-01-04: qty 10

## 2022-01-04 MED ORDER — BISACODYL 5 MG PO TBEC
10.0000 mg | DELAYED_RELEASE_TABLET | Freq: Every day | ORAL | Status: DC
  Administered 2022-01-05 – 2022-01-07 (×3): 10 mg via ORAL
  Filled 2022-01-04 (×3): qty 2

## 2022-01-04 MED ORDER — DOCUSATE SODIUM 100 MG PO CAPS
200.0000 mg | ORAL_CAPSULE | Freq: Every day | ORAL | Status: DC
  Administered 2022-01-05 – 2022-01-07 (×3): 200 mg via ORAL
  Filled 2022-01-04 (×3): qty 2

## 2022-01-04 MED ORDER — METOPROLOL TARTRATE 12.5 MG HALF TABLET
12.5000 mg | ORAL_TABLET | Freq: Two times a day (BID) | ORAL | Status: DC
  Administered 2022-01-05 – 2022-01-07 (×6): 12.5 mg via ORAL
  Filled 2022-01-04 (×6): qty 1

## 2022-01-04 MED ORDER — HEMOSTATIC AGENTS (NO CHARGE) OPTIME
TOPICAL | Status: DC | PRN
  Administered 2022-01-04: 3 via TOPICAL

## 2022-01-04 MED ORDER — PLASMA-LYTE A IV SOLN
INTRAVENOUS | Status: DC | PRN
  Administered 2022-01-04: 500 mL

## 2022-01-04 MED ORDER — NITROGLYCERIN IN D5W 200-5 MCG/ML-% IV SOLN: 0.0000 ug/min | INTRAVENOUS | Status: DC

## 2022-01-04 MED ORDER — ALBUMIN HUMAN 5 % IV SOLN: INTRAVENOUS | Status: DC | PRN

## 2022-01-04 MED ORDER — ASPIRIN 81 MG PO CHEW: 324.0000 mg | CHEWABLE_TABLET | Freq: Every day | ORAL | Status: DC

## 2022-01-04 MED ORDER — POTASSIUM CHLORIDE 10 MEQ/50ML IV SOLN
10.0000 meq | INTRAVENOUS | Status: AC
  Administered 2022-01-04 (×3): 10 meq via INTRAVENOUS

## 2022-01-04 MED ORDER — CHLORHEXIDINE GLUCONATE 0.12 % MT SOLN
OROMUCOSAL | Status: AC
  Administered 2022-01-04: 15 mL via OROMUCOSAL
  Filled 2022-01-04: qty 15

## 2022-01-04 MED ORDER — FENTANYL CITRATE (PF) 250 MCG/5ML IJ SOLN
INTRAMUSCULAR | Status: DC | PRN
  Administered 2022-01-04: 100 ug via INTRAVENOUS
  Administered 2022-01-04: 50 ug via INTRAVENOUS
  Administered 2022-01-04 (×3): 100 ug via INTRAVENOUS
  Administered 2022-01-04 (×2): 50 ug via INTRAVENOUS
  Administered 2022-01-04 (×2): 100 ug via INTRAVENOUS
  Administered 2022-01-04: 150 ug via INTRAVENOUS
  Administered 2022-01-04: 100 ug via INTRAVENOUS
  Administered 2022-01-04 (×2): 50 ug via INTRAVENOUS

## 2022-01-04 MED ORDER — PHENYLEPHRINE HCL (PRESSORS) 10 MG/ML IV SOLN
INTRAVENOUS | Status: AC
  Filled 2022-01-04: qty 1

## 2022-01-04 MED ORDER — CHLORHEXIDINE GLUCONATE 0.12 % MT SOLN: 15.0000 mL | Freq: Once | OROMUCOSAL | Status: AC

## 2022-01-04 MED ORDER — CALCIUM CHLORIDE 10 % IV SOLN
INTRAVENOUS | Status: DC | PRN
  Administered 2022-01-04: 150 mg via INTRAVENOUS
  Administered 2022-01-04 (×2): 100 mg via INTRAVENOUS
  Administered 2022-01-04: 150 mg via INTRAVENOUS
  Administered 2022-01-04: 100 mg via INTRAVENOUS

## 2022-01-04 MED ORDER — MIDAZOLAM HCL (PF) 5 MG/ML IJ SOLN
INTRAMUSCULAR | Status: DC | PRN
  Administered 2022-01-04 (×4): 1 mg via INTRAVENOUS

## 2022-01-04 MED ORDER — ACETAMINOPHEN 500 MG PO TABS
1000.0000 mg | ORAL_TABLET | Freq: Four times a day (QID) | ORAL | Status: DC
  Administered 2022-01-05 – 2022-01-08 (×13): 1000 mg via ORAL
  Filled 2022-01-04 (×13): qty 2

## 2022-01-04 MED ORDER — EPINEPHRINE HCL 5 MG/250ML IV SOLN IN NS: 5.0000 ug/min | INTRAVENOUS | Status: DC

## 2022-01-04 MED ORDER — LEVOFLOXACIN IN D5W 750 MG/150ML IV SOLN
750.0000 mg | INTRAVENOUS | Status: AC
  Administered 2022-01-05: 750 mg via INTRAVENOUS
  Filled 2022-01-04: qty 150

## 2022-01-04 MED ORDER — ORAL CARE MOUTH RINSE: 15.0000 mL | Freq: Once | OROMUCOSAL | Status: AC

## 2022-01-04 MED ORDER — LIDOCAINE 2% (20 MG/ML) 5 ML SYRINGE
INTRAMUSCULAR | Status: AC
  Filled 2022-01-04: qty 5

## 2022-01-04 MED ORDER — LIDOCAINE 2% (20 MG/ML) 5 ML SYRINGE
INTRAMUSCULAR | Status: DC | PRN
  Administered 2022-01-04: 60 mg via INTRAVENOUS

## 2022-01-04 MED ORDER — PANTOPRAZOLE SODIUM 40 MG PO TBEC
40.0000 mg | DELAYED_RELEASE_TABLET | Freq: Every day | ORAL | Status: DC
  Administered 2022-01-06 – 2022-01-07 (×2): 40 mg via ORAL
  Filled 2022-01-04 (×2): qty 1

## 2022-01-04 MED ORDER — MAGNESIUM SULFATE 4 GM/100ML IV SOLN
4.0000 g | Freq: Once | INTRAVENOUS | Status: AC
  Administered 2022-01-04: 4 g via INTRAVENOUS
  Filled 2022-01-04: qty 100

## 2022-01-04 MED ORDER — ROCURONIUM BROMIDE 10 MG/ML (PF) SYRINGE
PREFILLED_SYRINGE | INTRAVENOUS | Status: DC | PRN
  Administered 2022-01-04: 30 mg via INTRAVENOUS
  Administered 2022-01-04 (×2): 20 mg via INTRAVENOUS
  Administered 2022-01-04: 40 mg via INTRAVENOUS
  Administered 2022-01-04: 80 mg via INTRAVENOUS

## 2022-01-04 MED ORDER — LIDOCAINE HCL (CARDIAC) PF 100 MG/5ML IV SOSY: PREFILLED_SYRINGE | INTRAVENOUS | Status: DC | PRN

## 2022-01-04 MED ORDER — CHLORHEXIDINE GLUCONATE 0.12 % MT SOLN
15.0000 mL | OROMUCOSAL | Status: AC
  Administered 2022-01-04: 15 mL via OROMUCOSAL
  Filled 2022-01-04: qty 15

## 2022-01-04 MED ORDER — FAMOTIDINE IN NACL 20-0.9 MG/50ML-% IV SOLN
20.0000 mg | Freq: Two times a day (BID) | INTRAVENOUS | Status: AC
  Administered 2022-01-04: 20 mg via INTRAVENOUS
  Filled 2022-01-04: qty 50

## 2022-01-04 MED ORDER — PHENYLEPHRINE 80 MCG/ML (10ML) SYRINGE FOR IV PUSH (FOR BLOOD PRESSURE SUPPORT)
PREFILLED_SYRINGE | INTRAVENOUS | Status: AC
  Filled 2022-01-04: qty 10

## 2022-01-04 MED ORDER — DEXMEDETOMIDINE HCL IN NACL 400 MCG/100ML IV SOLN
0.0000 ug/kg/h | INTRAVENOUS | Status: DC
  Filled 2022-01-04: qty 100

## 2022-01-04 MED ORDER — INSULIN REGULAR(HUMAN) IN NACL 100-0.9 UT/100ML-% IV SOLN: INTRAVENOUS | Status: DC

## 2022-01-04 MED ORDER — MIDAZOLAM HCL (PF) 10 MG/2ML IJ SOLN
INTRAMUSCULAR | Status: AC
  Filled 2022-01-04: qty 2

## 2022-01-04 MED ORDER — ALBUMIN HUMAN 5 % IV SOLN
250.0000 mL | INTRAVENOUS | Status: AC | PRN
  Administered 2022-01-04 (×4): 12.5 g via INTRAVENOUS
  Filled 2022-01-04 (×2): qty 250

## 2022-01-04 MED ORDER — SODIUM CHLORIDE 0.9% FLUSH
3.0000 mL | Freq: Two times a day (BID) | INTRAVENOUS | Status: DC
  Administered 2022-01-05 – 2022-01-07 (×5): 3 mL via INTRAVENOUS

## 2022-01-04 MED ORDER — BUPIVACAINE LIPOSOME 1.3 % IJ SUSP
INTRAMUSCULAR | Status: AC
  Filled 2022-01-04: qty 20

## 2022-01-04 MED ORDER — TRAMADOL HCL 50 MG PO TABS
50.0000 mg | ORAL_TABLET | ORAL | Status: DC | PRN
  Administered 2022-01-05 – 2022-01-07 (×5): 100 mg via ORAL
  Administered 2022-01-08: 50 mg via ORAL
  Filled 2022-01-04 (×4): qty 2
  Filled 2022-01-04: qty 1
  Filled 2022-01-04: qty 2

## 2022-01-04 MED ORDER — MUPIROCIN 2 % EX OINT
1.0000 | TOPICAL_OINTMENT | Freq: Two times a day (BID) | CUTANEOUS | Status: DC
  Administered 2022-01-04 – 2022-01-07 (×7): 1 via NASAL
  Filled 2022-01-04 (×2): qty 22

## 2022-01-04 MED ORDER — DEXTROSE 50 % IV SOLN: 0.0000 mL | INTRAVENOUS | Status: DC | PRN

## 2022-01-04 MED ORDER — PROPOFOL 10 MG/ML IV BOLUS
INTRAVENOUS | Status: DC | PRN
  Administered 2022-01-04: 70 mg via INTRAVENOUS

## 2022-01-04 MED ORDER — HEPARIN SODIUM (PORCINE) 1000 UNIT/ML IJ SOLN
INTRAMUSCULAR | Status: AC
  Filled 2022-01-04: qty 1

## 2022-01-04 MED ORDER — NOREPINEPHRINE 4 MG/250ML-% IV SOLN: 0.0000 ug/min | INTRAVENOUS | Status: DC

## 2022-01-04 MED ORDER — SODIUM CHLORIDE 0.45 % IV SOLN: INTRAVENOUS | Status: DC | PRN

## 2022-01-04 MED ORDER — SODIUM CHLORIDE 0.9 % IV SOLN
250.0000 mL | INTRAVENOUS | Status: DC
  Administered 2022-01-06: 250 mL via INTRAVENOUS

## 2022-01-04 MED ORDER — BUPIVACAINE HCL (PF) 0.5 % IJ SOLN
INTRAMUSCULAR | Status: AC
  Filled 2022-01-04: qty 30

## 2022-01-04 MED ORDER — BISACODYL 10 MG RE SUPP: 10.0000 mg | Freq: Every day | RECTAL | Status: DC

## 2022-01-04 MED ORDER — HEPARIN SODIUM (PORCINE) 1000 UNIT/ML IJ SOLN
INTRAMUSCULAR | Status: DC | PRN
  Administered 2022-01-04: 5000 [IU] via INTRAVENOUS
  Administered 2022-01-04: 18000 [IU] via INTRAVENOUS

## 2022-01-04 MED ORDER — PHENYLEPHRINE HCL-NACL 20-0.9 MG/250ML-% IV SOLN: 0.0000 ug/min | INTRAVENOUS | Status: DC

## 2022-01-04 MED ORDER — ASPIRIN 325 MG PO TBEC
325.0000 mg | DELAYED_RELEASE_TABLET | Freq: Every day | ORAL | Status: DC
  Administered 2022-01-05 – 2022-01-07 (×3): 325 mg via ORAL
  Filled 2022-01-04 (×3): qty 1

## 2022-01-04 MED ORDER — ACETAMINOPHEN 650 MG RE SUPP: 650.0000 mg | Freq: Once | RECTAL | Status: DC

## 2022-01-04 MED ORDER — MIDAZOLAM HCL 2 MG/2ML IJ SOLN
2.0000 mg | INTRAMUSCULAR | Status: DC | PRN
  Filled 2022-01-04: qty 2

## 2022-01-04 MED ORDER — SODIUM CHLORIDE 0.9% FLUSH: 3.0000 mL | INTRAVENOUS | Status: DC | PRN

## 2022-01-04 SURGICAL SUPPLY — 114 items
APPLIER CLIP 11 MED OPEN (CLIP) ×2
APPLIER CLIP 9.375 SM OPEN (CLIP) ×6
ATRICLIP EXCLUSION VLAA SYSTEM (Miscellaneous) ×2 IMPLANT
BAG DECANTER FOR FLEXI CONT (MISCELLANEOUS) ×2 IMPLANT
BLADE CLIPPER SURG (BLADE) ×2 IMPLANT
BLADE STERNUM SYSTEM 6 (BLADE) ×2 IMPLANT
BLOWER MISTER CAL-MED (MISCELLANEOUS) IMPLANT
BNDG ELASTIC 4X5.8 VLCR STR LF (GAUZE/BANDAGES/DRESSINGS) ×2 IMPLANT
BNDG ELASTIC 6X10 VLCR STRL LF (GAUZE/BANDAGES/DRESSINGS) IMPLANT
BNDG ELASTIC 6X5.8 VLCR STR LF (GAUZE/BANDAGES/DRESSINGS) ×2 IMPLANT
BNDG GAUZE DERMACEA FLUFF 4 (GAUZE/BANDAGES/DRESSINGS) ×2 IMPLANT
CANISTER SUCT 3000ML PPV (MISCELLANEOUS) ×2 IMPLANT
CANNULA EZ GLIDE AORTIC 21FR (CANNULA) IMPLANT
CANNULA MC2 2 STG 29/37 NON-V (CANNULA) IMPLANT
CANNULA MC2 TWO STAGE (CANNULA) ×2
CANNULA SUMP PERICARDIAL (CANNULA) IMPLANT
CATH ROBINSON RED A/P 18FR (CATHETERS) ×4 IMPLANT
CLIP APPLIE 11 MED OPEN (CLIP) IMPLANT
CLIP APPLIE 9.375 SM OPEN (CLIP) IMPLANT
CLIP RETRACTION 3.0MM CORONARY (MISCELLANEOUS) IMPLANT
CLIP VESOCCLUDE MED 24/CT (CLIP) IMPLANT
CLIP VESOCCLUDE SM WIDE 24/CT (CLIP) IMPLANT
CONTAINER PROTECT SURGISLUSH (MISCELLANEOUS) ×4 IMPLANT
DEFOGGER ANTIFOG KIT (MISCELLANEOUS) IMPLANT
DERMABOND ADVANCED .7 DNX12 (GAUZE/BANDAGES/DRESSINGS) IMPLANT
DRAPE CARDIOVASCULAR INCISE (DRAPES) ×2
DRAPE SRG 135X102X78XABS (DRAPES) ×2 IMPLANT
DRAPE WARM FLUID 44X44 (DRAPES) ×2 IMPLANT
DRSG AQUACEL AG ADV 3.5X14 (GAUZE/BANDAGES/DRESSINGS) IMPLANT
DRSG COVADERM 4X14 (GAUZE/BANDAGES/DRESSINGS) ×2 IMPLANT
ELECT CAUTERY BLADE 6.4 (BLADE) ×2 IMPLANT
ELECT REM PT RETURN 9FT ADLT (ELECTROSURGICAL) ×4
ELECTRODE REM PT RTRN 9FT ADLT (ELECTROSURGICAL) ×4 IMPLANT
FELT TEFLON 1X6 (MISCELLANEOUS) ×4 IMPLANT
GAUZE 4X4 16PLY ~~LOC~~+RFID DBL (SPONGE) ×2 IMPLANT
GAUZE SPONGE 4X4 12PLY STRL (GAUZE/BANDAGES/DRESSINGS) ×4 IMPLANT
GAUZE SPONGE 4X4 12PLY STRL LF (GAUZE/BANDAGES/DRESSINGS) IMPLANT
GLOVE BIO SURGEON STRL SZ 6 (GLOVE) IMPLANT
GLOVE BIO SURGEON STRL SZ 6.5 (GLOVE) IMPLANT
GLOVE BIO SURGEON STRL SZ7 (GLOVE) IMPLANT
GLOVE BIO SURGEON STRL SZ7.5 (GLOVE) IMPLANT
GLOVE BIOGEL PI IND STRL 6 (GLOVE) IMPLANT
GLOVE BIOGEL PI IND STRL 6.5 (GLOVE) IMPLANT
GLOVE BIOGEL PI IND STRL 7.0 (GLOVE) IMPLANT
GLOVE ORTHO TXT STRL SZ7.5 (GLOVE) IMPLANT
GLOVE SURG MICRO LTX SZ7 (GLOVE) ×4 IMPLANT
GOWN STRL REUS W/ TWL LRG LVL3 (GOWN DISPOSABLE) ×8 IMPLANT
GOWN STRL REUS W/ TWL XL LVL3 (GOWN DISPOSABLE) ×2 IMPLANT
GOWN STRL REUS W/TWL LRG LVL3 (GOWN DISPOSABLE) ×8
GOWN STRL REUS W/TWL XL LVL3 (GOWN DISPOSABLE) ×2
HEMOSTAT NU-KNIT SURGICAL 3X4 (HEMOSTASIS) ×4 IMPLANT
HEMOSTAT POWDER SURGIFOAM 1G (HEMOSTASIS) ×6 IMPLANT
HEMOSTAT SURGICEL 2X14 (HEMOSTASIS) ×2 IMPLANT
INSERT FOGARTY 61MM (MISCELLANEOUS) IMPLANT
INSERT FOGARTY XLG (MISCELLANEOUS) IMPLANT
KIT BASIN OR (CUSTOM PROCEDURE TRAY) ×2 IMPLANT
KIT CATH CPB BARTLE (MISCELLANEOUS) ×2 IMPLANT
KIT SUCTION CATH 14FR (SUCTIONS) ×2 IMPLANT
KIT TURNOVER KIT B (KITS) ×2 IMPLANT
KIT VASOVIEW HEMOPRO 2 VH 4000 (KITS) ×2 IMPLANT
LEAD PACING MYOCARDI (MISCELLANEOUS) IMPLANT
LINE VENT (MISCELLANEOUS) IMPLANT
NS IRRIG 1000ML POUR BTL (IV SOLUTION) ×10 IMPLANT
OFFPUMP STABILIZER SUV (MISCELLANEOUS) ×2 IMPLANT
PACK E OPEN HEART (SUTURE) ×2 IMPLANT
PACK OPEN HEART (CUSTOM PROCEDURE TRAY) ×2 IMPLANT
PAD ARMBOARD 7.5X6 YLW CONV (MISCELLANEOUS) ×4 IMPLANT
PAD ELECT DEFIB RADIOL ZOLL (MISCELLANEOUS) ×2 IMPLANT
PENCIL BUTTON HOLSTER BLD 10FT (ELECTRODE) ×2 IMPLANT
POSITIONER HEAD DONUT 9IN (MISCELLANEOUS) ×2 IMPLANT
PROBE INTRAVASCULAR 45 (VASCULAR PRODUCTS) IMPLANT
PUNCH AORTIC ROTATE 4.0MM (MISCELLANEOUS) IMPLANT
PUNCH AORTIC ROTATE 4.5MM 8IN (MISCELLANEOUS) ×2 IMPLANT
PUNCH AORTIC ROTATE 5MM 8IN (MISCELLANEOUS) IMPLANT
SET CARDIOPLEGIA MPS 5001102 (MISCELLANEOUS) IMPLANT
SPONGE INTESTINAL PEANUT (DISPOSABLE) IMPLANT
SPONGE T-LAP 18X18 ~~LOC~~+RFID (SPONGE) IMPLANT
SUT BONE WAX W31G (SUTURE) ×4 IMPLANT
SUT MNCRL AB 4-0 PS2 18 (SUTURE) IMPLANT
SUT PROLENE 3 0 SH DA (SUTURE) IMPLANT
SUT PROLENE 3 0 SH1 36 (SUTURE) ×2 IMPLANT
SUT PROLENE 4 0 RB 1 (SUTURE) ×20
SUT PROLENE 4 0 SH DA (SUTURE) IMPLANT
SUT PROLENE 4-0 RB1 .5 CRCL 36 (SUTURE) IMPLANT
SUT PROLENE 5 0 C 1 36 (SUTURE) IMPLANT
SUT PROLENE 6 0 C 1 30 (SUTURE) IMPLANT
SUT PROLENE 7 0 BV 1 (SUTURE) IMPLANT
SUT PROLENE 7 0 BV1 MDA (SUTURE) ×2 IMPLANT
SUT PROLENE 8 0 BV175 6 (SUTURE) IMPLANT
SUT SILK  1 MH (SUTURE)
SUT SILK 1 MH (SUTURE) IMPLANT
SUT SILK 2 0 SH (SUTURE) IMPLANT
SUT SILK 2 0 SH CR/8 (SUTURE) IMPLANT
SUT STEEL STERNAL CCS#1 18IN (SUTURE) IMPLANT
SUT STEEL SZ 6 DBL 3X14 BALL (SUTURE) IMPLANT
SUT VIC AB 1 CTX 36 (SUTURE) ×4
SUT VIC AB 1 CTX36XBRD ANBCTR (SUTURE) ×4 IMPLANT
SUT VIC AB 2-0 CT1 27 (SUTURE) ×2
SUT VIC AB 2-0 CT1 TAPERPNT 27 (SUTURE) IMPLANT
SUT VIC AB 2-0 CTX 27 (SUTURE) IMPLANT
SUT VIC AB 3-0 SH 27 (SUTURE)
SUT VIC AB 3-0 SH 27X BRD (SUTURE) IMPLANT
SUT VIC AB 3-0 X1 27 (SUTURE) IMPLANT
SUT VICRYL 4-0 PS2 18IN ABS (SUTURE) IMPLANT
SYSTEM EXCLUSION ATRICLIP VLAA (Miscellaneous) IMPLANT
SYSTEM SAHARA CHEST DRAIN ATS (WOUND CARE) ×2 IMPLANT
TAPE CLOTH SURG 4X10 WHT LF (GAUZE/BANDAGES/DRESSINGS) IMPLANT
TAPE PAPER 2X10 WHT MICROPORE (GAUZE/BANDAGES/DRESSINGS) IMPLANT
TOWEL GREEN STERILE (TOWEL DISPOSABLE) ×2 IMPLANT
TOWEL GREEN STERILE FF (TOWEL DISPOSABLE) ×2 IMPLANT
TRAY FOLEY SLVR 16FR TEMP STAT (SET/KITS/TRAYS/PACK) ×2 IMPLANT
TUBING LAP HI FLOW INSUFFLATIO (TUBING) ×2 IMPLANT
UNDERPAD 30X36 HEAVY ABSORB (UNDERPADS AND DIAPERS) ×2 IMPLANT
WATER STERILE IRR 1000ML POUR (IV SOLUTION) ×4 IMPLANT

## 2022-01-04 NOTE — Anesthesia Procedure Notes (Signed)
Central Venous Catheter Insertion Performed by: Oleta Mouse, MD, anesthesiologist Start/End10/20/2023 7:18 AM, 01/04/2022 7:28 AM Patient location: Pre-op. Preanesthetic checklist: patient identified, IV checked, risks and benefits discussed, surgical consent, monitors and equipment checked, pre-op evaluation, timeout performed and anesthesia consent Hand hygiene performed  and maximum sterile barriers used  PA cath was placed.[place]wan type:thermodilution Procedure performed without using ultrasound guided technique. Attempts: 1 Patient tolerated the procedure well with no immediate complications.

## 2022-01-04 NOTE — H&P (Signed)
[address]       [place] [address]             [phone]                                        Amaree M Yousuf Tedrow Medical Record #[id number] Date of Birth: [birth year]   Referring: No ref. provider found Primary Care: Nche, [name], NP Primary Cardiologist:Tiffany Oval Linsey, MD   Chief Complaint:       Chief Complaint  Patient presents with   Near Syncope   Emesis      History of Present Illness: We are asked to see this 76 year old female in cardiothoracic surgical consultation for consideration of coronary artery surgical revascularization.  The patient presented to the emergency department yesterday following an episode of syncope.  He reports that during her yoga class she had a wave of feeling hot and became diaphoretic.  He has had a similar episode in the past so he decided to sit on the floor and then proceeded to pass out.  Reportedly she was out for at least a few seconds and upon waking she was very dizzy.  She denied chest pain or shortness of breath.  Reports of recurrent syncope over the past 20 years but notes that the episodes are lasting longer and are more difficult to recover from.  She has had a loop recorder placed in the past.  In the emergency room a CT of the head was obtained and it was unremarkable.  Her chest x-ray was unremarkable.  She was felt to have signs of clinical dehydration and was given IV fluids.  Her EKG was unremarkable.  Is noted the patient did have a cardiac CT scan recently and was found to have coronary stenosis so cardiology consultation was obtained and recommendations for admission under the hospitalist with plans for further cardiology evaluation.  It is noted the CTA showed the markedly elevated calcium score of 1527.  this was to include cardiac catheterization.  This was performed today and she is found to have severe three-vessel coronary artery disease.  Ejection fraction is noted to be 60 to 65%  with mid cavitary gradient due to hyperdynamic LV function and grade 1 diastolic dysfunction.  Please see the full report below.  Echocardiogram is also been obtained.  This shows LVEF EF by estimation to be greater than 75%.  Ventricular function is mildly reduced.  The aortic valve is tricuspid but poorly visualized.  There is also noted to be trivial mitral valve regurgitation.  A small PFO could not be excluded.  Please see the full report below.  Additional cardiac risk factors include hyperlipidemia and hypertension.  She is also noted to have a sarcoid.  She is not on steroids.  He has a history of renal artery restenosis with a creatinine is noted to be in the normal range on 01/02/2022 but she does have a history of chronic kidney disease stage IIIb.        Current Activity/ Functional Status: Patient is independent with mobility/ambulation, transfers, ADL's, IADL's.   Zubrod Score: At the time of surgery this patient's most appropriate activity status/level should be described as: '[x]'$     0    Normal activity, no symptoms '[]'$     1    Restricted in physical strenuous activity but ambulatory, able to  do out light work '[]'$     2    Ambulatory and capable of self care, unable to do work activities, up and about                 more than 50%  Of the time                            '[]'$     3    Only limited self care, in bed greater than 50% of waking hours '[]'$     4    Completely disabled, no self care, confined to bed or chair '[]'$     5    Moribund       Past Medical History:  Diagnosis Date   Aortic valve sclerosis 09/25/2021   Arthritis     Asthma     CAD in native artery 08/15/2020   Chickenpox     Colon polyps 2010   Epistaxis 03/29/2021   Heart murmur     History of fainting spells of unknown cause     Hyperlipidemia     Hypertension     Renal artery stenosis ([place]) 09/25/2021   Sarcoid 06/16/1976           Past Surgical History:  Procedure Laterality Date   ABDOMINAL HYSTERECTOMY    1987   BASAL CELL CARCINOMA EXCISION   06/30/2020    nose   LEFT HEART CATH AND CORONARY ANGIOGRAPHY N/A 01/02/2022    Procedure: LEFT HEART CATH AND CORONARY ANGIOGRAPHY;  Surgeon: Troy Sine, MD;  Location: [facility];  Service: Cardiovascular;  Laterality: N/A;   LUNG BIOPSY   06/16/1976   PERIPHERAL VASCULAR INTERVENTION Right 11/29/2020    Procedure: PERIPHERAL VASCULAR INTERVENTION;  Surgeon: Wellington Hampshire, MD;  Location: [facility];  Service: Cardiovascular;  Laterality: Right;  Renal Artery   RENAL ANGIOGRAPHY N/A 11/29/2020    Procedure: RENAL ANGIOGRAPHY;  Surgeon: Wellington Hampshire, MD;  Location: [facility];  Service: Cardiovascular;  Laterality: N/A;  Cataract surgery OU 2018   Social History        Tobacco Use  Smoking Status Never   Passive exposure: Past  Smokeless Tobacco Never    Social History        Substance and Sexual Activity  Alcohol Use Yes    Comment: once in a while            Allergies  Allergen Reactions   Keflex [Cephalexin] Rash   Penicillins Itching   Sulfa Antibiotics Itching               Current Facility-Administered Medications  Medication Dose Route Frequency Provider Last Rate Last Admin   0.9 %  sodium chloride infusion   Intravenous Continuous Troy Sine, MD 75 mL/hr at 01/02/22 1123 New Bag at 01/02/22 1123   0.9 %  sodium chloride infusion  250 mL Intravenous PRN Troy Sine, MD       acetaminophen (TYLENOL) tablet 650 mg  650 mg Oral Q6H PRN Karmen Bongo, MD        Or   acetaminophen (TYLENOL) suppository 650 mg  650 mg Rectal Q6H PRN Karmen Bongo, MD       acetaminophen (TYLENOL) tablet 650 mg  650 mg Oral Q4H PRN Troy Sine, MD       albuterol (PROVENTIL) (2.5 MG/3ML) 0.083% nebulizer solution 2.5 mg  2.5 mg Nebulization  Q4H PRN Karmen Bongo, MD       amLODipine (NORVASC) tablet 10 mg  10 mg Oral Daily Margie Billet, [place]   10 mg at 01/02/22 0944   aspirin EC tablet  81 mg  81 mg Oral Daily Karmen Bongo, MD   81 mg at 01/02/22 0944   [START ON 01/03/2022] enoxaparin (LOVENOX) injection 40 mg  40 mg Subcutaneous Q24H Troy Sine, MD       fluticasone Asencion Islam) 50 MCG/ACT nasal spray 2 spray  2 spray Each Nare Daily Karmen Bongo, MD       hydrALAZINE (APRESOLINE) injection 10 mg  10 mg Intravenous Q20 Min PRN Troy Sine, MD       hydrALAZINE (APRESOLINE) injection 5 mg  5 mg Intravenous Q4H PRN Karmen Bongo, MD       labetalol (NORMODYNE) injection 10 mg  10 mg Intravenous Q10 min PRN Troy Sine, MD       lactated ringers infusion   Intravenous Continuous Karmen Bongo, MD 62.6 mL/hr at 01/02/22 0700 Infusion Verify at 01/02/22 0700   mometasone-formoterol (DULERA) 200-5 MCG/ACT inhaler 2 puff  2 puff Inhalation BID Donnamae Jude, RPH   2 puff at 01/02/22 0711   ondansetron (ZOFRAN) tablet 4 mg  4 mg Oral Q6H PRN Karmen Bongo, MD        Or   ondansetron [facility]) injection 4 mg  4 mg Intravenous Q6H PRN Karmen Bongo, MD       ondansetron [facility]) injection 4 mg  4 mg Intravenous Q6H PRN Troy Sine, MD       Oral care mouth rinse  15 mL Mouth Rinse PRN Karmen Bongo, MD       pantoprazole (PROTONIX) EC tablet 40 mg  40 mg Oral Daily Karmen Bongo, MD   40 mg at 01/02/22 0944   rosuvastatin (CRESTOR) tablet 40 mg  40 mg Oral Daily Karmen Bongo, MD   40 mg at 01/02/22 0944   sodium chloride flush (NS) 0.9 % injection 3 mL  3 mL Intravenous Q12H Karmen Bongo, MD       sodium chloride flush (NS) 0.9 % injection 3 mL  3 mL Intravenous Q12H Vikki Ports R, PA-C       sodium chloride flush (NS) 0.9 % injection 3 mL  3 mL Intravenous Q12H Troy Sine, MD       sodium chloride flush (NS) 0.9 % injection 3 mL  3 mL Intravenous PRN Troy Sine, MD       umeclidinium bromide (INCRUSE ELLIPTA) 62.5 MCG/ACT 1 puff  1 puff Inhalation Daily Benetta Spar D, [facility]                 Medications Prior to Admission  Medication  Sig Dispense Refill Last Dose   albuterol (PROVENTIL) (2.5 MG/3ML) 0.083% nebulizer solution Take 3 mLs (2.5 mg total) by nebulization every 6 (six) hours as needed for wheezing or shortness of breath. USE 1 VIAL VIA NEBULIZER EVERY 6 HOURS Strength: (2.5 MG/3ML) 0.083% 150 mL 5 12/31/2021   albuterol (VENTOLIN HFA) 108 (90 Base) MCG/ACT inhaler Inhale 2 puffs into the lungs every 6 (six) hours as needed for wheezing or shortness of breath. 8 g 6 12/31/2021   amLODIPine-Valsartan-HCTZ (EXFORGE HCT) 10-160-12.5 MG TABS Take 1 tablet by mouth daily. 90 tablet 3 12/31/2021   aspirin EC 81 MG tablet Take 81 mg by mouth daily. Swallow whole.     12/31/2021  baclofen (LIORESAL) 10 MG tablet Take 1 tablet (10 mg total) by mouth 3 (three) times daily as needed for muscle spasms. 90 tablet 3 12/31/2021   Budeson-Glycopyrrol-Formoterol (BREZTRI AEROSPHERE) 160-9-4.8 MCG/ACT AERO Inhale 2 puffs into the lungs in the morning and at bedtime. 32.1 g 2 12/31/2021   Calcium Carb-Cholecalciferol (CALCIUM 600 + D PO) Take 1 tablet by mouth daily.     12/31/2021   cetirizine (ZYRTEC) 10 MG tablet Take 10 mg by mouth daily as needed for allergies.     12/31/2021   empagliflozin ([name]) 10 MG TABS tablet Take 1 tablet (10 mg total) by mouth daily before breakfast. 90 tablet 3 12/31/2021   famotidine (PEPCID) 10 MG tablet Take 10 mg by mouth daily as needed for heartburn.     Past Month   mometasone (NASONEX) 50 MCG/ACT nasal spray SHAKE LIQUID AND USE 2 SPRAYS IN EACH NOSTRIL DAILY 17 each 0 12/30/2021   Multiple Vitamins-Minerals (MULTIVITAL PO) Take 1 tablet by mouth daily.      12/31/2021   pantoprazole (PROTONIX) 40 MG tablet TAKE 1 TABLET BY MOUTH EVERY DAY 30 tablet 3 12/31/2021   Probiotic Product (PROBIOTIC DAILY PO) Take 1 tablet by mouth daily.      12/31/2021   rosuvastatin (CRESTOR) 40 MG tablet Take 1 tablet (40 mg total) by mouth daily. 90 tablet 3 12/31/2021   sodium chloride (OCEAN) 0.65 % SOLN nasal  spray Place 1 spray into both nostrils as needed for congestion.     Past Week   acetaminophen (TYLENOL) 500 MG tablet Take 500 mg by mouth every 6 (six) hours as needed for moderate pain.     12/30/2021   docusate sodium (COLACE) 100 MG capsule Take 100 mg by mouth daily.      12/30/2021   metoprolol tartrate (LOPRESSOR) 100 MG tablet Take 1 tablet (100 mg total) by mouth once for 1 dose. Take 1-2 hours prior to CT scan. 1 tablet 0             Family History  Problem Relation Age of Onset   Arthritis Mother     Cancer Mother     Depression Mother     Heart attack Mother     Hyperlipidemia Mother     Hypertension Mother     Alcohol abuse Father     Heart disease Father     Hypertension Father     Hyperlipidemia Father     Heart disease Sister     Stroke Sister     Alcohol abuse Brother     Heart disease Brother     Hypertension Brother     Hyperlipidemia Brother     Multiple sclerosis Daughter     Hyperlipidemia Son     Heart disease Maternal Grandfather     Heart disease Paternal Grandfather     Hyperlipidemia Sister     Alcohol abuse Sister     Hyperlipidemia Sister     Hypertension Sister     Alcohol abuse Brother     Early death Brother     Heart attack Brother     Heart disease Brother     Hyperlipidemia Brother          Review of Systems:    Review of Systems  Constitutional:  Positive for diaphoresis, malaise/fatigue and weight loss. Negative for chills and fever.  HENT:  Negative for congestion, ear discharge, ear pain, hearing loss, nosebleeds, sinus pain, sore throat and tinnitus.  Eyes:  Negative for blurred vision, double vision, photophobia, pain, discharge and redness.  Respiratory:  Positive for cough and wheezing. Negative for hemoptysis, sputum production, shortness of breath and stridor.   Cardiovascular:  Negative for chest pain, palpitations, orthopnea, claudication, leg swelling and PND.  Gastrointestinal:  Positive for heartburn, nausea and  vomiting. Negative for abdominal pain, blood in stool, constipation, diarrhea and melena.  Genitourinary:  Negative for hematuria.       Hx  of bladder prolapse  Musculoskeletal:  Positive for back pain, falls and myalgias. Negative for joint pain and neck pain.  Skin: Negative.   Neurological:  Positive for dizziness and loss of consciousness. Negative for tingling, tremors, sensory change, speech change, focal weakness, seizures, weakness and headaches.  Endo/Heme/Allergies:  Positive for environmental allergies. Negative for polydipsia. Bruises/bleeds easily.  Psychiatric/Behavioral:  The patient is nervous/anxious and has insomnia.                  Physical Exam: BP 112/74   Pulse 82   Temp 98.2 F (36.8 C) (Oral)   Resp 16   Ht '5\' 2"'$  (1.575 m)   Wt 62.5 kg   SpO2 98%   BMI 25.20 kg/m      General appearance: alert, cooperative, and no distress Head: Normocephalic, without obvious abnormality, atraumatic Neck: no adenopathy, no JVD, supple, symmetrical, trachea midline, thyroid not enlarged, symmetric, no tenderness/mass/nodules, and carotid bruit versus transmitted murmur Lymph nodes: Cervical, supraclavicular, and axillary nodes normal. Resp: Scattered coarse rhonchi Back: symmetric, no curvature. ROM normal. No CVA tenderness. Cardio: regular rate and rhythm and 2/6 systolic aortic murmur GI: soft, non-tender; bowel sounds normal; no masses,  no organomegaly Extremities: extremities normal, atraumatic, no cyanosis or edema Neurologic: Grossly normal   Diagnostic Studies & Laboratory data:     Recent Radiology Findings:    Imaging Results (Last 48 hours)  CARDIAC CATHETERIZATION   Result Date: 01/02/2022   Prox Cx to Mid Cx lesion is 70% stenosed.   1st Mrg lesion is 85% stenosed.   2nd Mrg lesion is 80% stenosed.   Mid LM to Dist LM lesion is 20% stenosed.   Ost LAD to Mid LAD lesion is 60% stenosed.   1st Diag lesion is 90% stenosed.   Mid LAD lesion is 80% stenosed.    Prox RCA-1 lesion is 80% stenosed.   Prox RCA-2 lesion is 95% stenosed.   Mid RCA lesion is 70% stenosed.   Mid RCA to Dist RCA lesion is 80% stenosed.   3rd RPL lesion is 80% stenosed. Severely calcified coronary arteries with multivessel CAD with calcification extending into the distal left main, diffuse 90% first diagonal stenosis, 60 to 80% proximal to mid LAD stenoses; left circumflex stenoses of 85% in a moderate-sized first marginal,70% in the circumflex before the OM 2 with 80% OM 2 stenosis; and severely calcified diffuse disease in the proximal extending into the mid distal RCA with 80% focal PLA stenosis. LVEDP 22 mmHg. RECOMMENDATION: Surgical consultation for CABG revascularization surgery.    ECHOCARDIOGRAM COMPLETE   Result Date: 01/01/2022    ECHOCARDIOGRAM REPORT   Patient Name:   PARUL PORCELLI Date of Exam: 01/01/2022 Medical Rec #:  [id number]         Height:       62.0 in Accession #:    [id number]        Weight:       138.0 lb Date of Birth:  11-09-1945  BSA:          1.633 m Patient Age:    76 years          BP:           153/97 mmHg Patient Gender: F                 HR:           82 bpm. Exam Location:  Inpatient Procedure: 2D Echo, Color Doppler and Cardiac Doppler Indications:    Stroke i63.9  History:        Patient has prior history of Echocardiogram examinations, most                 recent 02/16/2021. CAD, COPD; Risk Factors:Hypertension and                 Dyslipidemia.  Sonographer:    Raquel Sarna Senior RDCS Referring Phys: [id number] [place]  1. Left ventricular ejection fraction, by estimation, is >75%. The left ventricle has hyperdynamic function. The left ventricle has no regional wall motion abnormalities. Left ventricular diastolic parameters are consistent with Grade I diastolic dysfunction (impaired relaxation).  2. Right ventricular systolic function is mildly reduced. The right ventricular size is normal.  3. The mitral valve is abnormal.  Trivial mitral valve regurgitation.  4. The aortic valve is tricuspid. Aortic valve regurgitation is not visualized.  5. The inferior vena cava is normal in size with greater than 50% respiratory variability, suggesting right atrial pressure of 3 mmHg.  6. Cannot exclude a small PFO. Comparison(s): Changes from prior study are noted. 02/16/2021: LVEF 60-65%. FINDINGS  Left Ventricle: Left ventricular ejection fraction, by estimation, is >75%. The left ventricle has hyperdynamic function. The left ventricle has no regional wall motion abnormalities. The left ventricular internal cavity size was normal in size. There is no left ventricular hypertrophy. Left ventricular diastolic parameters are consistent with Grade I diastolic dysfunction (impaired relaxation). Indeterminate filling pressures. Right Ventricle: The right ventricular size is normal. No increase in right ventricular wall thickness. Right ventricular systolic function is mildly reduced. Left Atrium: Left atrial size was normal in size. Right Atrium: Right atrial size was normal in size. Pericardium: There is no evidence of pericardial effusion. Mitral Valve: The mitral valve is abnormal. There is mild calcification of the mitral valve leaflet(s). Trivial mitral valve regurgitation. Tricuspid Valve: The tricuspid valve is grossly normal. Tricuspid valve regurgitation is trivial. Aortic Valve: The aortic valve is tricuspid. Aortic valve regurgitation is not visualized. Pulmonic Valve: The pulmonic valve was normal in structure. Pulmonic valve regurgitation is not visualized. Aorta: The aortic root and ascending aorta are structurally normal, with no evidence of dilitation. Venous: The inferior vena cava is normal in size with greater than 50% respiratory variability, suggesting right atrial pressure of 3 mmHg. IAS/Shunts: Cannot exclude a small PFO.  LEFT VENTRICLE PLAX 2D LVIDd:         4.45 cm   Diastology LVIDs:         2.10 cm   LV e' medial:    6.42  cm/s LV PW:         0.75 cm   LV E/e' medial:  10.2 LV IVS:        0.70 cm   LV e' lateral:   6.20 cm/s LVOT diam:     2.00 cm   LV E/e' lateral: 10.6 LV SV:         85 LV SV Index:  52 LVOT Area:     3.14 cm  RIGHT VENTRICLE RV S prime:     8.16 cm/s TAPSE (M-mode): 1.8 cm LEFT ATRIUM             Index        RIGHT ATRIUM           Index LA diam:        3.00 cm 1.84 cm/m   RA Area:     10.30 cm LA Vol (A2C):   28.4 ml 17.39 ml/m  RA Volume:   19.10 ml  11.70 ml/m LA Vol (A4C):   32.0 ml 19.60 ml/m LA Biplane Vol: 32.5 ml 19.90 ml/m  AORTIC VALVE LVOT Vmax:   132.00 cm/s LVOT Vmean:  97.800 cm/s LVOT VTI:    0.272 m  AORTA Ao Root diam: 2.50 cm Ao Asc diam:  3.50 cm MITRAL VALVE MV Area (PHT): 2.27 cm    SHUNTS MV Decel Time: 334 msec    Systemic VTI:  0.27 m MV E velocity: 65.70 cm/s  Systemic Diam: 2.00 cm MV A velocity: 85.40 cm/s MV E/A ratio:  0.77 Lyman Bishop MD Electronically signed by Lyman Bishop MD Signature Date/Time: 01/01/2022/7:16:14 PM    Final        I have independently reviewed the above radiologic studies and discussed with the patient    Recent Lab Findings: Recent Labs       Lab Results  Component Value Date    WBC 6.3 01/02/2022    HGB 13.1 01/02/2022    HCT 38.7 01/02/2022    PLT 218 01/02/2022    GLUCOSE 96 01/02/2022    CHOL 171 07/31/2021    TRIG 99.0 07/31/2021    HDL 61.10 07/31/2021    LDLCALC 90 07/31/2021    ALT 16 12/31/2021    AST 21 12/31/2021    NA 140 01/02/2022    K 3.4 (L) 01/02/2022    CL 104 01/02/2022    CREATININE 0.97 01/02/2022    BUN 8 01/02/2022    CO2 24 01/02/2022    TSH 2.32 02/29/2020    INR 1.0 11/15/2021            Assessment / Plan: Severe three-vessel coronary artery disease. History of syncopal episodes x20 years.  Typically approximately 18 months apart.  Most recent leading to presentation to the emergency room.  More severe than previously with associated short loss of consciousness.  History of loop recorder in  the past with no specific findings. Aortic valve sclerosis-moderate murmur, echocardiogram did not visualize the valve well.  Patient has normal left ventricular ejection fraction Arthritis Asthma/COPD Hyperlipidemia Hypertension History of renal artery stenosis-status post right stent, history of stage IIIb chronic kidney disease History of bilateral  cataract surgery History of sarcoid Environmental allergies History of colon polyps History of cystocele GERD Basal cell skin cancer     The patient and her studies will be evaluated by the surgeon and recommendations will be made in terms of surgical options.             I  spent 40 minutes counseling the patient face to face.     John Giovanni, PA-C  01/02/2022 1:06 PM     Seen and discussed.    29F with symptomatic 3v CAD. Normal EF. No sig valvular disease.  + renal and pulmonary cormorbidities. Hx of unclear, possibly vasovagal recurrent syncope   Plan for CABG x 3 on Friday.  Possible LAA  clip as well.    Risks/benefits/alternatives discussed at length including 2% mortality, 10% morbidity (any organ, including renal issues, pulmonary or and fall due to syncope), and 85%+ standard recovery.  Of note, husband is retired Therapist, sports and several children in healthcare as well.

## 2022-01-04 NOTE — Progress Notes (Signed)
Dr. Marcelle Smiling paged re: Nasal Swab + for Staph but Negative for MRSA. Order set for MRSA ordered.

## 2022-01-04 NOTE — Progress Notes (Signed)
Talked with Dr. Marcelle Smiling and gave him the MRSA swab results and order set ordered for the nasal ointment. Clarified if pt needs to do a urine specimen this am since she had one on the 16th when she was admitted. He stated No the one on the 16th is ok.

## 2022-01-04 NOTE — Brief Op Note (Signed)
12/31/2021 - 01/04/2022  7:11 AM  PATIENT:  Kristen Suarez  76 y.o. female  PRE-OPERATIVE DIAGNOSIS:  Coronary Artery Disease  POST-OPERATIVE DIAGNOSIS:  Coronary Artery Disease  PROCEDURE:  Procedure(s): CORONARY ARTERY BYPASS GRAFTING (CABG) x  USING ENDOSCOPIC GREATER SAPHENOUS VEIN HARVEST (N/A) TRANSESOPHAGEAL ECHOCARDIOGRAM (TEE) (N/A) CLIPPING OF ATRIAL APPENDAGE USING SIZE 40 ATRICURE ATRIAL CLIP (N/A) Vein harvest time: 48mn Vein prep time: 173m  SURGEON:  Surgeon(s) and Role:    * Enter, DaPierre BaliMD - Primary  PHYSICIAN ASSISTANT: Sumaiya Arruda PA-C  ASSISTANTS: RNFA   ANESTHESIA:   general  EBL: [id number] ADMINISTERED:2 UNITS PRBC'S  DRAINS:  LEFT PLEURAL, MEDIASTINAL CHEST DRAINS    LOCAL MEDICATIONS USED:  BUPIVICAINE  and OTHER EXPAREL  SPECIMEN:  No Specimen  DISPOSITION OF SPECIMEN:  N/A  COUNTS:  YES  TOURNIQUET:  * No tourniquets in log *  DICTATION: .Dragon Dictation  PLAN OF CARE: Admit to inpatient   PATIENT DISPOSITION:  ICU - intubated and hemodynamically stable.   Delay start of Pharmacological VTE agent (>24hrs) due to surgical blood loss or risk of bleeding: yes  COMPLICATIONS: NO KNOWN

## 2022-01-04 NOTE — Anesthesia Procedure Notes (Signed)
Arterial Line Insertion Start/End10/20/2023 7:00 AM Performed by: Janace Litten, CRNA  Patient location: Pre-op. Preanesthetic checklist: patient identified, surgical consent, monitors and equipment checked and pre-op evaluation Lidocaine 1% used for infiltration Left, radial was placed Catheter size: 20 G Hand hygiene performed  and maximum sterile barriers used   Procedure performed without using ultrasound guided technique. Following insertion, dressing applied and Biopatch. Post procedure assessment: normal  Patient tolerated the procedure well with no immediate complications.

## 2022-01-04 NOTE — Progress Notes (Signed)
Left per stretcher for OR. Left in stable condition. Report given to OR Nurse Mora Appl. Inhalers and pt OHS book sent with pt and Merleen Nicely made aware of this and that Consent wasn't signed because she had questions. Husband went with pt down to surgery.  Tele taken off pt and put at the desk.

## 2022-01-04 NOTE — Progress Notes (Signed)
Explained to pt her MRSA Nasal Swab came back neg. For MRSA but + for Staph so the nurses will put ointment in her nose (both sides) BID and that I notified the Cardiologist and let him know. Pt given wipes, mouthwash, and Lopressor po to do her preop bath and meds.

## 2022-01-04 NOTE — Procedures (Signed)
Extubation Procedure Note  Patient Details:   Name: Kristen Suarez DOB: Jun 06, 1945 MRN: [id number]   Airway Documentation:    Vent end date: 01/04/22 Vent end time: 2331   Evaluation  O2 sats: stable throughout Complications: No apparent complications Patient did tolerate procedure well. Bilateral Breath Sounds: Clear, Diminished   Yes  Patient with positive cuff leak prior to extubation, currently on 4L [place].   NIF -25  VC 0.9 L  Jamiaya Bina R Emma Schupp 01/04/2022, 11:40 PM

## 2022-01-04 NOTE — Anesthesia Postprocedure Evaluation (Signed)
Anesthesia Post Note  Patient: Kristen Suarez  Procedure(s) Performed: CORONARY ARTERY BYPASS GRAFTING (CABG) x  USING ENDOSCOPIC GREATER SAPHENOUS VEIN HARVEST (Chest) TRANSESOPHAGEAL ECHOCARDIOGRAM (TEE) CLIPPING OF ATRIAL APPENDAGE USING SIZE 40 ATRICURE ATRIAL CLIP (Chest)     Patient location during evaluation: SICU Anesthesia Type: General Level of consciousness: patient remains intubated per anesthesia plan Pain management: pain level controlled Vital Signs Assessment: post-procedure vital signs reviewed and stable Respiratory status: patient remains intubated per anesthesia plan Cardiovascular status: stable Postop Assessment: no apparent nausea or vomiting Anesthetic complications: yes   No notable events documented.  Last Vitals:  Vitals:   01/04/22 0418 01/04/22 0642  BP: (!) 162/73 (!) 128/100  Pulse: 70 69  Resp: 16 16  Temp: 36.7 C 36.7 C  SpO2: 98%     Last Pain:  Vitals:   01/04/22 0642  TempSrc: Oral  PainSc:                  Liya Strollo

## 2022-01-04 NOTE — Transfer of Care (Signed)
Immediate Anesthesia Transfer of Care Note  Patient: Kristen Suarez  Procedure(s) Performed: CORONARY ARTERY BYPASS GRAFTING (CABG) x  USING ENDOSCOPIC GREATER SAPHENOUS VEIN HARVEST (Chest) TRANSESOPHAGEAL ECHOCARDIOGRAM (TEE) CLIPPING OF ATRIAL APPENDAGE USING SIZE 46 ATRICURE ATRIAL CLIP (Chest)  Patient Location: ICU  Anesthesia Type:General  Level of Consciousness: Patient remains intubated per anesthesia plan  Airway & Oxygen Therapy: Patient remains intubated per anesthesia plan and Patient placed on Ventilator (see vital sign flow sheet for setting)  Post-op Assessment: Report given to RN and Post -op Vital signs reviewed and stable  Post vital signs: Reviewed and stable  Last Vitals:  Vitals Value Taken Time  BP    Temp    Pulse    Resp    SpO2      Last Pain:  Vitals:   01/04/22 0642  TempSrc: Oral  PainSc:       Patients Stated Pain Goal: 0 (62/26/33 3545)  Complications: No notable events documented.

## 2022-01-04 NOTE — Interval H&P Note (Signed)
History and Physical Interval Note:  01/04/2022 8:14 AM  Kristen Suarez  has presented today for surgery, with the diagnosis of CAD.  The various methods of treatment have been discussed with the patient and family. After consideration of risks, benefits and other options for treatment, the patient has consented to  Procedure(s): CORONARY ARTERY BYPASS GRAFTING (CABG) (N/A) TRANSESOPHAGEAL ECHOCARDIOGRAM (TEE) (N/A) as a surgical intervention.  The patient's history has been reviewed, patient examined, no change in status, stable for surgery.  I have reviewed the patient's chart and labs.  Questions were answered to the patient's satisfaction.     Pierre Bali Rael Tilly

## 2022-01-04 NOTE — Anesthesia Procedure Notes (Addendum)
Central Venous Catheter Insertion Performed by: Oleta Mouse, MD, anesthesiologist Start/End10/20/2023 7:18 AM, 01/04/2022 7:28 AM Patient location: Pre-op. Preanesthetic checklist: patient identified, IV checked, risks and benefits discussed, surgical consent, monitors and equipment checked, pre-op evaluation, timeout performed and anesthesia consent Position: Trendelenburg Lidocaine 1% used for infiltration and patient sedated Hand hygiene performed , maximum sterile barriers used  and Seldinger technique used Catheter size: 8.5 Fr Total catheter length 10. Central line was placed.Double lumen Procedure performed using ultrasound guided technique. Ultrasound Notes:anatomy identified, needle tip was noted to be adjacent to the nerve/plexus identified, no ultrasound evidence of intravascular and/or intraneural injection and image(s) printed for medical record Attempts: 1 Following insertion, dressing applied, line sutured and Biopatch. Post procedure assessment: blood return through all ports and free fluid flow  Patient tolerated the procedure well with no immediate complications.

## 2022-01-04 NOTE — Anesthesia Preprocedure Evaluation (Signed)
Anesthesia Evaluation  Patient identified by MRN, date of birth, ID band Patient awake    Reviewed: Allergy & Precautions, NPO status , Patient's Chart, lab work & pertinent test results  History of Anesthesia Complications Negative for: history of anesthetic complications  Airway Mallampati: III  TM Distance: >3 FB Neck ROM: Full    Dental  (+) Teeth Intact, Dental Advisory Given   Pulmonary neg shortness of breath, asthma , neg sleep apnea, COPD,  COPD inhaler, neg recent URI,    breath sounds clear to auscultation       Cardiovascular hypertension, Pt. on medications + CAD and + Peripheral Vascular Disease   Rhythm:Regular  1. Left ventricular ejection fraction, by estimation, is >75%. The left  ventricle has hyperdynamic function. The left ventricle has no regional  wall motion abnormalities. Left ventricular diastolic parameters are  consistent with Grade I diastolic  dysfunction (impaired relaxation).  2. Right ventricular systolic function is mildly reduced. The right  ventricular size is normal.  3. The mitral valve is abnormal. Trivial mitral valve regurgitation.  4. The aortic valve is tricuspid. Aortic valve regurgitation is not  visualized.  5. The inferior vena cava is normal in size with greater than 50%  respiratory variability, suggesting right atrial pressure of 3 mmHg.  6. Cannot exclude a small PFO.   Marland Kitchen  Prox Cx to Mid Cx lesion is 70% stenosed. .  1st Mrg lesion is 85% stenosed. .  2nd Mrg lesion is 80% stenosed. .  Mid LM to Dist LM lesion is 20% stenosed. Colon Flattery LAD to Mid LAD lesion is 60% stenosed. .  1st Diag lesion is 90% stenosed. .  Mid LAD lesion is 80% stenosed. .  Prox RCA-1 lesion is 80% stenosed. .  Prox RCA-2 lesion is 95% stenosed. .  Mid RCA lesion is 70% stenosed. .  Mid RCA to Dist RCA lesion is 80% stenosed. .  3rd RPL lesion is 80% stenosed.  Severely calcified coronary  arteries with multivessel CAD with calcification extending into the distal left main, diffuse 90% first diagonal stenosis, 60 to 80% proximal to mid LAD stenoses; left circumflex stenoses of 85% in a moderate-sized first marginal,70% in the circumflex before the OM 2 with 80% OM 2 stenosis; and severely calcified diffuse disease in the proximal extending into the mid distal RCA with 80% focal PLA stenosis.  LVEDP 22 mmHg.    Neuro/Psych negative neurological ROS  negative psych ROS   GI/Hepatic GERD  Medicated,  Endo/Other  negative endocrine ROSNo results found for: "HGBA1C"   Renal/GU Renal InsufficiencyRenal diseaseLab Results      Component                Value               Date                      CREATININE               1.09 (H)            01/04/2022           Lab Results      Component                Value               Date  K                        3.8                 01/04/2022                Musculoskeletal  (+) Arthritis ,   Abdominal   Peds  Hematology Lab Results      Component                Value               Date                      WBC                      7.0                 01/04/2022                HGB                      12.6                01/04/2022                HCT                      37.8                01/04/2022                MCV                      91.1                01/04/2022                PLT                      231                 01/04/2022              Anesthesia Other Findings   Reproductive/Obstetrics                             Anesthesia Physical Anesthesia Plan  ASA: 4  Anesthesia Plan: General   Post-op Pain Management:    Induction: Intravenous  PONV Risk Score and Plan: 3 and Ondansetron and Treatment may vary due to age or medical condition  Airway Management Planned: Oral ETT  Additional Equipment: Arterial line, CVP, PA Cath, TEE and Ultrasound Guidance  Line Placement  Intra-op Plan:   Post-operative Plan: Post-operative intubation/ventilation  Informed Consent: I have reviewed the patients History and Physical, chart, labs and discussed the procedure including the risks, benefits and alternatives for the proposed anesthesia with the patient or authorized representative who has indicated his/her understanding and acceptance.     Dental advisory given  Plan Discussed with: CRNA  Anesthesia Plan Comments:         Anesthesia Quick Evaluation

## 2022-01-04 NOTE — Progress Notes (Signed)
[facility] signoff note  No charge note  The care of this patient has been transferred from the [facility] service to the following service:  Service: Cardiothoracic surgery Attending: Dr. Justice Rocher.  Date & time: 01/04/2022, 12:40 PM  I discussed with a cardiothoracic surgery team member today.  As per usual, post CABG, patient's full care will be taken over by the cardiothoracic surgery team.  By the time I arrived at 7 AM today, patient was already in the OR and I did not get to see her today.   Please re consult the [facility] team for any further assistance.  Thank you   [employer]. MD FACP, [place], [facility], [place], [place]     To contact a [place] provider, please log into the web site [email or web address] and access using universal [place] password for that web site. If you do not have the password, please call the hospital operator. Page "[place]" listed on top of the page. Provide a number where you can be directly reached.

## 2022-01-04 NOTE — Plan of Care (Signed)
Discussed importance of doing IS, CHG baths, + Staph in nares and tx, and Pre OHS Lopressor given at 0500 with bath. Pt voiced understanding of all areas taught tonight. Pt steady on her feet and has been doing her own CHG baths. Pharmacy stated earlier all her surgery meds were sent to surgery already. Consent remains unsigned because pt had a couple of questions to ask the doctor first.

## 2022-01-04 NOTE — Anesthesia Procedure Notes (Signed)
Procedure Name: Intubation Date/Time: 01/04/2022 8:45 AM  Performed by: Janace Litten, CRNAPre-anesthesia Checklist: Patient identified, Emergency Drugs available, Suction available and Patient being monitored Patient Re-evaluated:Patient Re-evaluated prior to induction Oxygen Delivery Method: Circle System Utilized Preoxygenation: Pre-oxygenation with 100% oxygen Induction Type: IV induction Ventilation: Mask ventilation without difficulty Laryngoscope Size: Mac and 3 Grade View: Grade II Tube type: Oral Tube size: 7.5 mm Number of attempts: 1 Airway Equipment and Method: Stylet Placement Confirmation: ETT inserted through vocal cords under direct vision, positive ETCO2 and breath sounds checked- equal and bilateral Secured at: 21 cm Tube secured with: Tape Dental Injury: Teeth and Oropharynx as per pre-operative assessment

## 2022-01-05 ENCOUNTER — Inpatient Hospital Stay (HOSPITAL_COMMUNITY): Payer: Medicare Other

## 2022-01-05 DIAGNOSIS — Z951 Presence of aortocoronary bypass graft: Secondary | ICD-10-CM

## 2022-01-05 LAB — POCT I-STAT 7, (LYTES, BLD GAS, ICA,H+H)
Acid-base deficit: 4 mmol/L — ABNORMAL HIGH (ref 0.0–2.0)
Acid-base deficit: 5 mmol/L — ABNORMAL HIGH (ref 0.0–2.0)
Bicarbonate: 20.3 mmol/L (ref 20.0–28.0)
Bicarbonate: 22 mmol/L (ref 20.0–28.0)
Calcium, Ion: 1.14 mmol/L — ABNORMAL LOW (ref 1.15–1.40)
Calcium, Ion: 1.16 mmol/L (ref 1.15–1.40)
HCT: 27 % — ABNORMAL LOW (ref 36.0–46.0)
HCT: 28 % — ABNORMAL LOW (ref 36.0–46.0)
Hemoglobin: 9.2 g/dL — ABNORMAL LOW (ref 12.0–15.0)
Hemoglobin: 9.5 g/dL — ABNORMAL LOW (ref 12.0–15.0)
O2 Saturation: 97 %
O2 Saturation: 98 %
Patient temperature: 37.3
Patient temperature: 37.4
Potassium: 4.4 mmol/L (ref 3.5–5.1)
Potassium: 4.7 mmol/L (ref 3.5–5.1)
Sodium: 139 mmol/L (ref 135–145)
Sodium: 141 mmol/L (ref 135–145)
TCO2: 21 mmol/L — ABNORMAL LOW (ref 22–32)
TCO2: 23 mmol/L (ref 22–32)
pCO2 arterial: 39.3 mmHg (ref 32–48)
pCO2 arterial: 43.4 mmHg (ref 32–48)
pH, Arterial: 7.316 — ABNORMAL LOW (ref 7.35–7.45)
pH, Arterial: 7.323 — ABNORMAL LOW (ref 7.35–7.45)
pO2, Arterial: 106 mmHg (ref 83–108)
pO2, Arterial: 97 mmHg (ref 83–108)

## 2022-01-05 LAB — CBC
HCT: 28.2 % — ABNORMAL LOW (ref 36.0–46.0)
HCT: 28.4 % — ABNORMAL LOW (ref 36.0–46.0)
Hemoglobin: 9.3 g/dL — ABNORMAL LOW (ref 12.0–15.0)
Hemoglobin: 9 g/dL — ABNORMAL LOW (ref 12.0–15.0)
MCH: 30.1 pg (ref 26.0–34.0)
MCH: 29.8 pg (ref 26.0–34.0)
MCHC: 33 g/dL (ref 30.0–36.0)
MCHC: 31.7 g/dL (ref 30.0–36.0)
MCV: 91.3 fL (ref 80.0–100.0)
MCV: 94 fL (ref 80.0–100.0)
Platelets: 135 10*3/uL — ABNORMAL LOW (ref 150–400)
Platelets: 117 10*3/uL — ABNORMAL LOW (ref 150–400)
RBC: 3.09 MIL/uL — ABNORMAL LOW (ref 3.87–5.11)
RBC: 3.02 MIL/uL — ABNORMAL LOW (ref 3.87–5.11)
RDW: 14 % (ref 11.5–15.5)
RDW: 14 % (ref 11.5–15.5)
WBC: 12.9 10*3/uL — ABNORMAL HIGH (ref 4.0–10.5)
WBC: 10.3 10*3/uL (ref 4.0–10.5)
nRBC: 0 % (ref 0.0–0.2)
nRBC: 0 % (ref 0.0–0.2)

## 2022-01-05 LAB — GLUCOSE, CAPILLARY
Glucose-Capillary: 117 mg/dL — ABNORMAL HIGH (ref 70–99)
Glucose-Capillary: 107 mg/dL — ABNORMAL HIGH (ref 70–99)
Glucose-Capillary: 112 mg/dL — ABNORMAL HIGH (ref 70–99)
Glucose-Capillary: 53 mg/dL — ABNORMAL LOW (ref 70–99)
Glucose-Capillary: 120 mg/dL — ABNORMAL HIGH (ref 70–99)
Glucose-Capillary: 121 mg/dL — ABNORMAL HIGH (ref 70–99)
Glucose-Capillary: 111 mg/dL — ABNORMAL HIGH (ref 70–99)
Glucose-Capillary: 131 mg/dL — ABNORMAL HIGH (ref 70–99)
Glucose-Capillary: 117 mg/dL — ABNORMAL HIGH (ref 70–99)
Glucose-Capillary: 147 mg/dL — ABNORMAL HIGH (ref 70–99)

## 2022-01-05 LAB — BASIC METABOLIC PANEL
Anion gap: 9 (ref 5–15)
Anion gap: 8 (ref 5–15)
BUN: 12 mg/dL (ref 8–23)
BUN: 15 mg/dL (ref 8–23)
CO2: 22 mmol/L (ref 22–32)
CO2: 25 mmol/L (ref 22–32)
Calcium: 7.6 mg/dL — ABNORMAL LOW (ref 8.9–10.3)
Calcium: 8.1 mg/dL — ABNORMAL LOW (ref 8.9–10.3)
Chloride: 110 mmol/L (ref 98–111)
Chloride: 108 mmol/L (ref 98–111)
Creatinine, Ser: 1 mg/dL (ref 0.44–1.00)
Creatinine, Ser: 1.17 mg/dL — ABNORMAL HIGH (ref 0.44–1.00)
GFR, Estimated: 58 mL/min — ABNORMAL LOW (ref >60)
GFR, Estimated: 48 mL/min — ABNORMAL LOW (ref >60)
Glucose, Bld: 123 mg/dL — ABNORMAL HIGH (ref 70–99)
Glucose, Bld: 142 mg/dL — ABNORMAL HIGH (ref 70–99)
Potassium: 4.4 mmol/L (ref 3.5–5.1)
Potassium: 3.8 mmol/L (ref 3.5–5.1)
Sodium: 141 mmol/L (ref 135–145)
Sodium: 141 mmol/L (ref 135–145)

## 2022-01-05 LAB — COOXEMETRY PANEL
Carboxyhemoglobin: 1.4 % (ref 0.5–1.5)
Carboxyhemoglobin: 2 % — ABNORMAL HIGH (ref 0.5–1.5)
Methemoglobin: <0.7 % (ref 0.0–1.5)
Methemoglobin: <0.7 % (ref 0.0–1.5)
O2 Saturation: 69.7 %
O2 Saturation: 70.6 %
Total hemoglobin: 9.4 g/dL — ABNORMAL LOW (ref 12.0–16.0)
Total hemoglobin: 9.5 g/dL — ABNORMAL LOW (ref 12.0–16.0)

## 2022-01-05 LAB — MAGNESIUM
Magnesium: 2.5 mg/dL — ABNORMAL HIGH (ref 1.7–2.4)
Magnesium: 2.3 mg/dL (ref 1.7–2.4)

## 2022-01-05 MED ORDER — FUROSEMIDE 10 MG/ML IJ SOLN
20.0000 mg | Freq: Once | INTRAMUSCULAR | Status: AC
  Administered 2022-01-05: 20 mg via INTRAVENOUS
  Filled 2022-01-05: qty 2

## 2022-01-05 MED ORDER — FUROSEMIDE 10 MG/ML IJ SOLN
20.0000 mg | Freq: Two times a day (BID) | INTRAMUSCULAR | Status: DC
  Administered 2022-01-05 – 2022-01-07 (×5): 20 mg via INTRAVENOUS
  Filled 2022-01-05 (×5): qty 2

## 2022-01-05 MED ORDER — INSULIN ASPART 100 UNIT/ML IJ SOLN
0.0000 [IU] | INTRAMUSCULAR | Status: DC
  Administered 2022-01-05 – 2022-01-08 (×10): 2 [IU] via SUBCUTANEOUS

## 2022-01-05 MED ORDER — PROCHLORPERAZINE EDISYLATE 10 MG/2ML IJ SOLN
5.0000 mg | Freq: Four times a day (QID) | INTRAMUSCULAR | Status: DC | PRN
  Administered 2022-01-05 – 2022-01-08 (×4): 5 mg via INTRAVENOUS
  Filled 2022-01-05 (×7): qty 1

## 2022-01-05 NOTE — Progress Notes (Signed)
POD 1 CAB / LAA closure  O Vitals:   01/05/22 2230 01/05/22 2300  BP: (!) 103/48   Pulse: 69   Resp: 11   Temp:  98 F (36.7 C)  SpO2: 98%     CBC    Component Value Date/Time   WBC 10.3 01/05/2022 1700   RBC 3.02 (L) 01/05/2022 1700   HGB 9.0 (L) 01/05/2022 1700   HGB 13.5 11/21/2020 1025   HCT 28.4 (L) 01/05/2022 1700   HCT 39.8 11/21/2020 1025   PLT 117 (L) 01/05/2022 1700   PLT 245 11/21/2020 1025   MCV 94.0 01/05/2022 1700   MCV 89 11/21/2020 1025   MCH 29.8 01/05/2022 1700   MCHC 31.7 01/05/2022 1700   RDW 14.0 01/05/2022 1700   RDW 13.0 11/21/2020 1025   LYMPHSABS 2.9 12/31/2021 1106   MONOABS 0.9 12/31/2021 1106   EOSABS 0.4 12/31/2021 1106   BASOSABS 0.1 12/31/2021 1106      Latest Ref Rng & Units 01/05/2022    5:00 PM 01/05/2022    4:55 AM 01/05/2022   12:30 AM  CMP  Glucose 70 - 99 mg/dL 142  123    BUN 8 - 23 mg/dL 15  12    Creatinine 0.44 - 1.00 mg/dL 1.17  1.00    Sodium 135 - 145 mmol/L 141  141  141   Potassium 3.5 - 5.1 mmol/L 3.8  4.4  4.4   Chloride 98 - 111 mmol/L 108  110    CO2 22 - 32 mmol/L 25  22    Calcium 8.9 - 10.3 mg/dL 8.1  7.6       Exam NAD Not in pain after CT removal Resp nonlaboured RRR Abd soft ntnd Extr wwp   XR No sig changes  A/P 24F s/p CAB/LAA closure Co-morbidities include hx of recurrent syncope, 1 functioning kidney, 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   Doing very well overall, off all gtts. Lasix IV BID DCD chest tubes Swan out  Possible floor transfer tomorrow.

## 2022-01-06 ENCOUNTER — Inpatient Hospital Stay (HOSPITAL_COMMUNITY): Payer: Medicare Other

## 2022-01-06 LAB — CBC WITH DIFFERENTIAL/PLATELET
Abs Immature Granulocytes: 0.05 10*3/uL (ref 0.00–0.07)
Basophils Absolute: 0 10*3/uL (ref 0.0–0.1)
Basophils Relative: 0 %
Eosinophils Absolute: 0 10*3/uL (ref 0.0–0.5)
Eosinophils Relative: 0 %
HCT: 26.7 % — ABNORMAL LOW (ref 36.0–46.0)
Hemoglobin: 8.6 g/dL — ABNORMAL LOW (ref 12.0–15.0)
Immature Granulocytes: 1 %
Lymphocytes Relative: 11 %
Lymphs Abs: 1.2 10*3/uL (ref 0.7–4.0)
MCH: 30 pg (ref 26.0–34.0)
MCHC: 32.2 g/dL (ref 30.0–36.0)
MCV: 93 fL (ref 80.0–100.0)
Monocytes Absolute: 1.2 10*3/uL — ABNORMAL HIGH (ref 0.1–1.0)
Monocytes Relative: 12 %
Neutro Abs: 8.1 10*3/uL — ABNORMAL HIGH (ref 1.7–7.7)
Neutrophils Relative %: 76 %
Platelets: 111 10*3/uL — ABNORMAL LOW (ref 150–400)
RBC: 2.87 MIL/uL — ABNORMAL LOW (ref 3.87–5.11)
RDW: 14 % (ref 11.5–15.5)
WBC: 10.6 10*3/uL — ABNORMAL HIGH (ref 4.0–10.5)
nRBC: 0 % (ref 0.0–0.2)

## 2022-01-06 LAB — GLUCOSE, CAPILLARY
Glucose-Capillary: 111 mg/dL — ABNORMAL HIGH (ref 70–99)
Glucose-Capillary: 129 mg/dL — ABNORMAL HIGH (ref 70–99)
Glucose-Capillary: 154 mg/dL — ABNORMAL HIGH (ref 70–99)
Glucose-Capillary: 144 mg/dL — ABNORMAL HIGH (ref 70–99)
Glucose-Capillary: 135 mg/dL — ABNORMAL HIGH (ref 70–99)
Glucose-Capillary: 116 mg/dL — ABNORMAL HIGH (ref 70–99)

## 2022-01-06 LAB — BASIC METABOLIC PANEL
Anion gap: 6 (ref 5–15)
BUN: 17 mg/dL (ref 8–23)
CO2: 27 mmol/L (ref 22–32)
Calcium: 8.2 mg/dL — ABNORMAL LOW (ref 8.9–10.3)
Chloride: 105 mmol/L (ref 98–111)
Creatinine, Ser: 1.18 mg/dL — ABNORMAL HIGH (ref 0.44–1.00)
GFR, Estimated: 48 mL/min — ABNORMAL LOW (ref >60)
Glucose, Bld: 118 mg/dL — ABNORMAL HIGH (ref 70–99)
Potassium: 3.5 mmol/L (ref 3.5–5.1)
Sodium: 138 mmol/L (ref 135–145)

## 2022-01-06 MED ORDER — AMIODARONE LOAD VIA INFUSION
150.0000 mg | Freq: Once | INTRAVENOUS | Status: AC
  Administered 2022-01-06: 150 mg via INTRAVENOUS
  Filled 2022-01-06: qty 83.34

## 2022-01-06 MED ORDER — POTASSIUM CHLORIDE 10 MEQ/50ML IV SOLN
10.0000 meq | INTRAVENOUS | Status: AC
  Administered 2022-01-06 (×3): 10 meq via INTRAVENOUS
  Filled 2022-01-06 (×3): qty 50

## 2022-01-06 MED ORDER — AMIODARONE HCL IN DEXTROSE 360-4.14 MG/200ML-% IV SOLN
30.0000 mg/h | INTRAVENOUS | Status: DC
  Administered 2022-01-06 – 2022-01-07 (×2): 30 mg/h via INTRAVENOUS
  Filled 2022-01-06: qty 200
  Filled 2022-01-06: qty 400

## 2022-01-06 MED ORDER — AMIODARONE HCL IN DEXTROSE 360-4.14 MG/200ML-% IV SOLN
60.0000 mg/h | INTRAVENOUS | Status: AC
  Administered 2022-01-06 (×2): 60 mg/h via INTRAVENOUS
  Filled 2022-01-06 (×2): qty 200

## 2022-01-06 NOTE — Progress Notes (Signed)
POD 2 CAB  NAEO  O Today's Vitals   01/06/22 0750 01/06/22 0800 01/06/22 1000 01/06/22 1100  BP:   125/67 121/61  Pulse:  95 91 88  Resp:  20 (!) 21 19  Temp:      TempSrc:      SpO2:  97% 98% 97%  Weight:      Height:      PainSc: 2       Body mass index is 27.66 kg/m.   Intake/Output Summary (Last 24 hours) at 01/06/2022 1114 Last data filed at 01/06/2022 1100 Gross per 24 hour  Intake 3322.8 ml  Output 2195 ml  Net 1127.8 ml       Latest Ref Rng & Units 01/06/2022    3:45 AM 01/05/2022    5:00 PM 01/05/2022    4:55 AM  CBC  WBC 4.0 - 10.5 K/uL 10.6  10.3  12.9   Hemoglobin 12.0 - 15.0 g/dL 8.6  9.0  9.3   Hematocrit 36.0 - 46.0 % 26.7  28.4  28.2   Platelets 150 - 400 K/uL 111  117  135   \    Latest Ref Rng & Units 01/06/2022    3:45 AM 01/05/2022    5:00 PM 01/05/2022    4:55 AM  CMP  Glucose 70 - 99 mg/dL 118  142  123   BUN 8 - 23 mg/dL '17  15  12   '$ Creatinine 0.44 - 1.00 mg/dL 1.18  1.17  1.00   Sodium 135 - 145 mmol/L 138  141  141   Potassium 3.5 - 5.1 mmol/L 3.5  3.8  4.4   Chloride 98 - 111 mmol/L 105  108  110   CO2 22 - 32 mmol/L '27  25  22   '$ Calcium 8.9 - 10.3 mg/dL 8.2  8.1  7.6     Exam NAD  RRR Nonaboured resp WWP   XR pending  A/P  POD 2 CAB On ASA Dc central line Dc foley 11pm Ok to transfer to floor WC, hgb, Cr stable

## 2022-01-06 NOTE — Hospital Course (Signed)
History of Present Illness: At the time of CT surgical consultation   we are asked to see this 76 year old female in cardiothoracic surgical consultation for consideration of coronary artery surgical revascularization.  The patient presented to the emergency department yesterday following an episode of syncope.  He reports that during her yoga class she had a wave of feeling hot and became diaphoretic.  He has had a similar episode in the past so he decided to sit on the floor and then proceeded to pass out.  Reportedly she was out for at least a few seconds and upon waking she was very dizzy.  She denied chest pain or shortness of breath.  Reports of recurrent syncope over the past 20 years but notes that the episodes are lasting longer and are more difficult to recover from.  She has had a loop recorder placed in the past.  In the emergency room a CT of the head was obtained and it was unremarkable.  Her chest x-ray was unremarkable.  She was felt to have signs of clinical dehydration and was given IV fluids.  Her EKG was unremarkable.  Is noted the patient did have a cardiac CT scan recently and was found to have coronary stenosis so cardiology consultation was obtained and recommendations for admission under the hospitalist with plans for further cardiology evaluation.  It is noted the CTA showed the markedly elevated calcium score of 1527.  this was to include cardiac catheterization.  This was performed today and she is found to have severe three-vessel coronary artery disease.  Ejection fraction is noted to be 60 to 65% with mid cavitary gradient due to hyperdynamic LV function and grade 1 diastolic dysfunction.  Please see the full report below.  Echocardiogram is also been obtained.  This shows LVEF EF by estimation to be greater than 75%.  Ventricular function is mildly reduced.  The aortic valve is tricuspid but poorly visualized.  There is also noted to be trivial mitral valve regurgitation.  A small  PFO could not be excluded.  Please see the full report below.  Additional cardiac risk factors include hyperlipidemia and hypertension.  She is also noted to have a sarcoid.  She is not on steroids.  He has a history of renal artery restenosis with a creatinine is noted to be in the normal range on 01/02/2022 but she does have a history of chronic kidney disease stage IIIb.    Hospital course: Following diagnostic evaluation and medical stabilization, the patient and all relevant studies were reviewed by Dr. Leodis Liverpool who recommended proceeding with CABG.  On 01/04/2022 she was taken the operating room at which time she underwent coronary artery bypass grafting x4.  She tolerated the procedure well was taken the surgical intensive care unit in stable condition.  Operative hospital course:  Patient has done well.  She was weaned from the ventilator without difficulty using standard post cardiac surgical protocols.  Swan-Ganz was removed on postoperative day #1 as per chest tubes.  He is noted to have some postoperative expected volume overload and was started on a course of IV diuretics initially.  Central line was removed on postoperative day #2.  She has been started on aspirin.  He is noted to have an expected acute blood loss anemia which is stable and being monitored clinically.  Renal function is noted to show a slightly elevated creatinine and this was monitored closely and remained reasonably stable.  She had persistent dizziness with nausea and poor appetite  that was treated with a transdermal scopolamine patch.  She developed atrial fibrillation treated with IV amiodarone loading and eventual transition to oral amiodarone. She was transferred to 4E Progressive Care on post-op day 4.

## 2022-01-07 ENCOUNTER — Encounter (HOSPITAL_COMMUNITY): Payer: Self-pay | Admitting: Cardiothoracic Surgery

## 2022-01-07 LAB — TYPE AND SCREEN
ABO/RH(D): O POS
Antibody Screen: NEGATIVE
Unit division: 0
Unit division: 0
Unit division: 0
Unit division: 0

## 2022-01-07 LAB — CBC WITH DIFFERENTIAL/PLATELET
Abs Immature Granulocytes: 0.07 10*3/uL (ref 0.00–0.07)
Abs Immature Granulocytes: 0.05 10*3/uL (ref 0.00–0.07)
Basophils Absolute: 0 10*3/uL (ref 0.0–0.1)
Basophils Absolute: 0 10*3/uL (ref 0.0–0.1)
Basophils Relative: 0 %
Basophils Relative: 0 %
Eosinophils Absolute: 0.1 10*3/uL (ref 0.0–0.5)
Eosinophils Absolute: 0.2 10*3/uL (ref 0.0–0.5)
Eosinophils Relative: 1 %
Eosinophils Relative: 2 %
HCT: 24.8 % — ABNORMAL LOW (ref 36.0–46.0)
HCT: 25.3 % — ABNORMAL LOW (ref 36.0–46.0)
Hemoglobin: 8.1 g/dL — ABNORMAL LOW (ref 12.0–15.0)
Hemoglobin: 8.3 g/dL — ABNORMAL LOW (ref 12.0–15.0)
Immature Granulocytes: 1 %
Immature Granulocytes: 1 %
Lymphocytes Relative: 13 %
Lymphocytes Relative: 12 %
Lymphs Abs: 1.3 10*3/uL (ref 0.7–4.0)
Lymphs Abs: 1.3 10*3/uL (ref 0.7–4.0)
MCH: 30.7 pg (ref 26.0–34.0)
MCH: 30.5 pg (ref 26.0–34.0)
MCHC: 32.7 g/dL (ref 30.0–36.0)
MCHC: 32.8 g/dL (ref 30.0–36.0)
MCV: 93.9 fL (ref 80.0–100.0)
MCV: 93 fL (ref 80.0–100.0)
Monocytes Absolute: 1 10*3/uL (ref 0.1–1.0)
Monocytes Absolute: 0.9 10*3/uL (ref 0.1–1.0)
Monocytes Relative: 10 %
Monocytes Relative: 9 %
Neutro Abs: 7.9 10*3/uL — ABNORMAL HIGH (ref 1.7–7.7)
Neutro Abs: 7.9 10*3/uL — ABNORMAL HIGH (ref 1.7–7.7)
Neutrophils Relative %: 75 %
Neutrophils Relative %: 76 %
Platelets: 136 10*3/uL — ABNORMAL LOW (ref 150–400)
Platelets: 125 10*3/uL — ABNORMAL LOW (ref 150–400)
RBC: 2.64 MIL/uL — ABNORMAL LOW (ref 3.87–5.11)
RBC: 2.72 MIL/uL — ABNORMAL LOW (ref 3.87–5.11)
RDW: 14 % (ref 11.5–15.5)
RDW: 13.8 % (ref 11.5–15.5)
WBC: 10.5 10*3/uL (ref 4.0–10.5)
WBC: 10.3 10*3/uL (ref 4.0–10.5)
nRBC: 0 % (ref 0.0–0.2)
nRBC: 0 % (ref 0.0–0.2)

## 2022-01-07 LAB — GLUCOSE, CAPILLARY
Glucose-Capillary: 115 mg/dL — ABNORMAL HIGH (ref 70–99)
Glucose-Capillary: 117 mg/dL — ABNORMAL HIGH (ref 70–99)
Glucose-Capillary: 127 mg/dL — ABNORMAL HIGH (ref 70–99)
Glucose-Capillary: 131 mg/dL — ABNORMAL HIGH (ref 70–99)
Glucose-Capillary: 132 mg/dL — ABNORMAL HIGH (ref 70–99)
Glucose-Capillary: 430 mg/dL — ABNORMAL HIGH (ref 70–99)
Glucose-Capillary: 91 mg/dL (ref 70–99)
Glucose-Capillary: 95 mg/dL (ref 70–99)

## 2022-01-07 LAB — BASIC METABOLIC PANEL
Anion gap: 10 (ref 5–15)
Anion gap: 8 (ref 5–15)
BUN: 17 mg/dL (ref 8–23)
BUN: 16 mg/dL (ref 8–23)
CO2: 27 mmol/L (ref 22–32)
CO2: 28 mmol/L (ref 22–32)
Calcium: 8 mg/dL — ABNORMAL LOW (ref 8.9–10.3)
Calcium: 8.2 mg/dL — ABNORMAL LOW (ref 8.9–10.3)
Chloride: 100 mmol/L (ref 98–111)
Chloride: 101 mmol/L (ref 98–111)
Creatinine, Ser: 1.03 mg/dL — ABNORMAL HIGH (ref 0.44–1.00)
Creatinine, Ser: 1.01 mg/dL — ABNORMAL HIGH (ref 0.44–1.00)
GFR, Estimated: 56 mL/min — ABNORMAL LOW (ref >60)
GFR, Estimated: 58 mL/min — ABNORMAL LOW (ref >60)
Glucose, Bld: 89 mg/dL (ref 70–99)
Glucose, Bld: 113 mg/dL — ABNORMAL HIGH (ref 70–99)
Potassium: 3.2 mmol/L — ABNORMAL LOW (ref 3.5–5.1)
Potassium: 3.2 mmol/L — ABNORMAL LOW (ref 3.5–5.1)
Sodium: 137 mmol/L (ref 135–145)
Sodium: 137 mmol/L (ref 135–145)

## 2022-01-07 LAB — BPAM RBC
Blood Product Expiration Date: 202311182359
Blood Product Expiration Date: 202311182359
Blood Product Expiration Date: 202311182359
Blood Product Expiration Date: 202311192359
ISSUE DATE / TIME: 202310200937
ISSUE DATE / TIME: 202310200937
ISSUE DATE / TIME: 202310200937
ISSUE DATE / TIME: 202310200937
Unit Type and Rh: 5100
Unit Type and Rh: 5100
Unit Type and Rh: 5100
Unit Type and Rh: 5100

## 2022-01-07 LAB — MAGNESIUM
Magnesium: 2.1 mg/dL (ref 1.7–2.4)
Magnesium: 2 mg/dL (ref 1.7–2.4)

## 2022-01-07 MED ORDER — AMIODARONE HCL 200 MG PO TABS: 400.0000 mg | ORAL_TABLET | Freq: Two times a day (BID) | ORAL | Status: DC

## 2022-01-07 MED ORDER — POTASSIUM CHLORIDE 10 MEQ/50ML IV SOLN
10.0000 meq | INTRAVENOUS | Status: AC
  Administered 2022-01-07 (×3): 10 meq via INTRAVENOUS
  Filled 2022-01-07 (×3): qty 50

## 2022-01-07 MED ORDER — AMIODARONE HCL 200 MG PO TABS
200.0000 mg | ORAL_TABLET | Freq: Two times a day (BID) | ORAL | Status: DC
  Administered 2022-01-07 – 2022-01-13 (×13): 200 mg via ORAL
  Filled 2022-01-07 (×13): qty 1

## 2022-01-07 NOTE — Progress Notes (Signed)
[facility] DAILY ICU PROGRESS NOTE                   [address]            [place] [address]          [phone]   3 Days Post-Op Procedure(s) (LRB): CORONARY ARTERY BYPASS GRAFTING (CABG) x  USING ENDOSCOPIC GREATER SAPHENOUS VEIN HARVEST (N/A) TRANSESOPHAGEAL ECHOCARDIOGRAM (TEE) (N/A) CLIPPING OF ATRIAL APPENDAGE USING SIZE 40 ATRICURE ATRIAL CLIP (N/A)  Total Length of Stay:  LOS: 4 days   Subjective: Weak and dizzy yesterday but feeling better today.  Back in SR.   Objective: Vital signs in last 24 hours: Temp:  [97.6 F (36.4 C)-98.1 F (36.7 C)] 97.9 F (36.6 C) (10/23 0650) Pulse Rate:  [63-120] 75 (10/23 0700) Cardiac Rhythm: Normal sinus rhythm (10/23 0400) Resp:  [10-26] 19 (10/23 0700) BP: (72-138)/(40-103) 109/56 (10/23 0700) SpO2:  [94 %-99 %] 98 % (10/23 0700) Weight:  [70.7 kg] 70.7 kg (10/23 0500)  Filed Weights   01/05/22 0500 01/06/22 0500 01/07/22 0500  Weight: 69.8 kg 68.6 kg 70.7 kg    Weight change: 2.1 kg   Hemodynamic parameters for last 24 hours:    Intake/Output from previous day: 10/22 0701 - 10/23 0700 In: 892.9 [P.O.:240; I.V.:495.1; IV Piggyback:157.8] Out: 1565 [Urine:1525; Drains:40]  Intake/Output this shift: No intake/output data recorded.  Current Meds: Scheduled Meds:  acetaminophen  1,000 mg Oral Q6H   Or   acetaminophen (TYLENOL) oral liquid 160 mg/5 mL  1,000 mg Per Tube Q6H   acetaminophen (TYLENOL) oral liquid 160 mg/5 mL  650 mg Per Tube Once   Or   acetaminophen  650 mg Rectal Once   aspirin EC  325 mg Oral Daily   Or   aspirin  324 mg Per Tube Daily   bisacodyl  10 mg Oral Daily   Or   bisacodyl  10 mg Rectal Daily   Chlorhexidine Gluconate Cloth  6 each Topical Q0600   docusate sodium  200 mg Oral Daily   fluticasone  2 spray Each Nare Daily   furosemide  20 mg Intravenous BID   insulin aspart  0-24 Units Subcutaneous Q4H   metoprolol tartrate  12.5 mg Oral BID   Or   metoprolol  tartrate  12.5 mg Per Tube BID   mometasone-formoterol  2 puff Inhalation BID   mupirocin ointment  1 Application Nasal BID   pantoprazole  40 mg Oral Daily   rosuvastatin  40 mg Oral Daily   sodium chloride flush  3 mL Intravenous Q12H   Continuous Infusions:  sodium chloride Stopped (01/04/22 2101)   sodium chloride 250 mL (01/06/22 0605)   sodium chloride 10 mL/hr at 01/05/22 1800   amiodarone 30 mg/hr (01/07/22 0700)   dexmedetomidine (PRECEDEX) IV infusion 0.7 mcg/kg/hr (01/05/22 1800)   epinephrine Stopped (01/05/22 0754)   lactated ringers     lactated ringers     lactated ringers 180 mL/hr at 01/05/22 1800   nitroGLYCERIN     norepinephrine (LEVOPHED) Adult infusion Stopped (01/04/22 2326)   phenylephrine (NEO-SYNEPHRINE) Adult infusion 0 mcg/min (01/04/22 1515)   potassium chloride 10 mEq (01/07/22 0756)   PRN Meds:.sodium chloride, albuterol, lactated ringers, metoprolol tartrate, midazolam, morphine injection, ondansetron (ZOFRAN) IV, oxyCODONE, prochlorperazine, sodium chloride flush, traMADol  General appearance: alert, cooperative, and no distress Neurologic: intact Heart: NSR since ~6pm yesterday.  Lungs: Coarse crackles on left, clear on right.  Abdomen: soft,  NT, few bowel sounds Extremities: mild peripheral edema. The RLE EVH incision is covered with a dry dressing Wound: Dry AquaCell dressing over the sternal incision.   Lab Results: CBC: Recent Labs    01/06/22 0345 01/07/22 0532  WBC 10.6* 10.3  HGB 8.6* 8.3*  HCT 26.7* 25.3*  PLT 111* 125*   BMET:  Recent Labs    01/06/22 0345 01/07/22 0532  NA 138 137  K 3.5 3.2*  CL 105 101  CO2 27 28  GLUCOSE 118* 113*  BUN 17 16  CREATININE 1.18* 1.01*  CALCIUM 8.2* 8.2*    CMET: Lab Results  Component Value Date   WBC 10.3 01/07/2022   HGB 8.3 (L) 01/07/2022   HCT 25.3 (L) 01/07/2022   PLT 125 (L) 01/07/2022   GLUCOSE 113 (H) 01/07/2022   CHOL 141 01/03/2022   TRIG 82 01/03/2022   HDL 49  01/03/2022   LDLCALC 76 01/03/2022   ALT 16 12/31/2021   AST 21 12/31/2021   NA 137 01/07/2022   K 3.2 (L) 01/07/2022   CL 101 01/07/2022   CREATININE 1.01 (H) 01/07/2022   BUN 16 01/07/2022   CO2 28 01/07/2022   TSH 2.32 02/29/2020   INR 1.6 (H) 01/04/2022      PT/INR:  Recent Labs    01/04/22 1522  LABPROT 18.8*  INR 1.6*   Radiology: X-ray chest PA or AP  Result Date: 01/06/2022 CLINICAL DATA:  Status post cardiac surgery. EXAM: CHEST  1 VIEW COMPARISON:  Chest x-rays dated 01/05/2022 and 01/04/2022. Chest CT dated 12/29/2020 FINDINGS: Heart size and mediastinal contours are stable. Swan-Ganz catheter has been removed, Cordis remaining with tip at the level of the upper SVC. Patchy airspace opacities are again seen within the bilateral upper lobes, stable, corresponding to the distribution of severe interstitial fibrosis demonstrated on chest CT of 12/29/2020. No new lung findings are seen. No pneumothorax is seen. IMPRESSION: 1. Stable patchy airspace opacities within the bilateral upper lobes, suspicious for multifocal pneumonia or edema superimposed on chronic interstitial fibrosis. No new lung findings. 2. Swan-Ganz catheter has been removed. Electronically Signed   By: Franki Cabot M.D.   On: 01/06/2022 12:43     Assessment/Plan: S/P Procedure(s) (LRB): CORONARY ARTERY BYPASS GRAFTING (CABG) x  USING ENDOSCOPIC GREATER SAPHENOUS VEIN HARVEST (N/A) TRANSESOPHAGEAL ECHOCARDIOGRAM (TEE) (N/A) CLIPPING OF ATRIAL APPENDAGE USING SIZE 40 ATRICURE ATRIAL CLIP (N/A)  -POD3 CABG and closure of left atrial appendage. Stable VS. On ASA, low-dose metoprolol, and rosuvastatin. Progressing slowly with ambulation due to weakness and dizziness but feeling better today.    -Post-op atrial fibrillation- began around 11am yesterday and had converted to NSR by 5:45pm on IV amiodarone load. Convert to PO amio today then d/c the central line. Correcting hypokalemia.   -PULM- adequate  oxygenation on 2LncO2. CXR yesterday showing some pulmonary edema and chronic interstitial fibrotic changes. Diurese.   -GI tolerating clear liquids, passing gas. Advance diet as tolerated.   -Renal- normal function at baseline. Wt is about 8kg +. Continue diuresis.   -HEME- expected acute blood loss anemia- stable and tolerating well. Monitor.   -DVT PPX- using SCD's, ambulate.   -Disposition- convert to oral amiodarone today then d/c the central line.  Transfer to 4E Progressive Care. PT/OT consults.      Antony Odea, PA-C [phone] 01/07/2022 8:22 AM

## 2022-01-07 NOTE — Progress Notes (Signed)
      [address]       ,[place] [address]             [phone]      POD # 3 CABG   Resting comfortably  BP (!) 114/59   Pulse 83   Temp 98.4 F (36.9 C) (Oral)   Resp (!) 23   Ht '5\' 2"'$  (1.575 m)   Wt 70.7 kg   SpO2 (!) 83%   BMI 28.51 kg/m  In SR   Intake/Output Summary (Last 24 hours) at 01/07/2022 1739 Last data filed at 01/07/2022 1400 Gross per 24 hour  Intake 770.16 ml  Output 2495 ml  Net -1724.84 ml   CBG well controlled  Remo Lipps C. Roxan Hockey, MD [facility] [phone]

## 2022-01-08 LAB — CBC WITH DIFFERENTIAL/PLATELET
Abs Immature Granulocytes: 0.06 10*3/uL (ref 0.00–0.07)
Basophils Absolute: 0.1 10*3/uL (ref 0.0–0.1)
Basophils Relative: 1 %
Eosinophils Absolute: 0.2 10*3/uL (ref 0.0–0.5)
Eosinophils Relative: 2 %
HCT: 25.2 % — ABNORMAL LOW (ref 36.0–46.0)
Hemoglobin: 8.8 g/dL — ABNORMAL LOW (ref 12.0–15.0)
Immature Granulocytes: 1 %
Lymphocytes Relative: 14 %
Lymphs Abs: 1.6 10*3/uL (ref 0.7–4.0)
MCH: 31.5 pg (ref 26.0–34.0)
MCHC: 34.9 g/dL (ref 30.0–36.0)
MCV: 90.3 fL (ref 80.0–100.0)
Monocytes Absolute: 1.1 10*3/uL — ABNORMAL HIGH (ref 0.1–1.0)
Monocytes Relative: 10 %
Neutro Abs: 7.9 10*3/uL — ABNORMAL HIGH (ref 1.7–7.7)
Neutrophils Relative %: 72 %
Platelets: 190 10*3/uL (ref 150–400)
RBC: 2.79 MIL/uL — ABNORMAL LOW (ref 3.87–5.11)
RDW: 13.7 % (ref 11.5–15.5)
WBC: 10.8 10*3/uL — ABNORMAL HIGH (ref 4.0–10.5)
nRBC: 0 % (ref 0.0–0.2)

## 2022-01-08 LAB — BASIC METABOLIC PANEL
Anion gap: 12 (ref 5–15)
BUN: 15 mg/dL (ref 8–23)
CO2: 28 mmol/L (ref 22–32)
Calcium: 8.4 mg/dL — ABNORMAL LOW (ref 8.9–10.3)
Chloride: 97 mmol/L — ABNORMAL LOW (ref 98–111)
Creatinine, Ser: 0.99 mg/dL (ref 0.44–1.00)
GFR, Estimated: 59 mL/min — ABNORMAL LOW (ref >60)
Glucose, Bld: 97 mg/dL (ref 70–99)
Potassium: 3.2 mmol/L — ABNORMAL LOW (ref 3.5–5.1)
Sodium: 137 mmol/L (ref 135–145)

## 2022-01-08 LAB — CBC
HCT: 27.5 % — ABNORMAL LOW (ref 36.0–46.0)
Hemoglobin: 8.9 g/dL — ABNORMAL LOW (ref 12.0–15.0)
MCH: 29.9 pg (ref 26.0–34.0)
MCHC: 32.4 g/dL (ref 30.0–36.0)
MCV: 92.3 fL (ref 80.0–100.0)
Platelets: 194 10*3/uL (ref 150–400)
RBC: 2.98 MIL/uL — ABNORMAL LOW (ref 3.87–5.11)
RDW: 13.9 % (ref 11.5–15.5)
WBC: 9.1 10*3/uL (ref 4.0–10.5)
nRBC: 0 % (ref 0.0–0.2)

## 2022-01-08 LAB — GLUCOSE, CAPILLARY
Glucose-Capillary: 93 mg/dL (ref 70–99)
Glucose-Capillary: 124 mg/dL — ABNORMAL HIGH (ref 70–99)
Glucose-Capillary: 130 mg/dL — ABNORMAL HIGH (ref 70–99)
Glucose-Capillary: 108 mg/dL — ABNORMAL HIGH (ref 70–99)
Glucose-Capillary: 102 mg/dL — ABNORMAL HIGH (ref 70–99)

## 2022-01-08 LAB — MAGNESIUM: Magnesium: 2 mg/dL (ref 1.7–2.4)

## 2022-01-08 MED ORDER — CHLORHEXIDINE GLUCONATE CLOTH 2 % EX PADS
6.0000 | MEDICATED_PAD | Freq: Every day | CUTANEOUS | Status: DC
  Administered 2022-01-08 – 2022-01-10 (×2): 6 via TOPICAL

## 2022-01-08 MED ORDER — INSULIN ASPART 100 UNIT/ML IJ SOLN
0.0000 [IU] | Freq: Three times a day (TID) | INTRAMUSCULAR | Status: DC
  Administered 2022-01-08 – 2022-01-11 (×2): 2 [IU] via SUBCUTANEOUS

## 2022-01-08 MED ORDER — POTASSIUM CHLORIDE 10 MEQ/50ML IV SOLN: 10.0000 meq | INTRAVENOUS | Status: DC

## 2022-01-08 MED ORDER — ~~LOC~~ CARDIAC SURGERY, PATIENT & FAMILY EDUCATION: Freq: Once | Status: AC

## 2022-01-08 MED ORDER — ONDANSETRON HCL 4 MG/2ML IJ SOLN
4.0000 mg | Freq: Four times a day (QID) | INTRAMUSCULAR | Status: DC | PRN
  Administered 2022-01-08: 4 mg via INTRAVENOUS
  Filled 2022-01-08: qty 2

## 2022-01-08 MED ORDER — SCOPOLAMINE 1 MG/3DAYS TD PT72
1.0000 | MEDICATED_PATCH | TRANSDERMAL | Status: DC
  Administered 2022-01-08 – 2022-01-11 (×2): 1.5 mg via TRANSDERMAL
  Filled 2022-01-08 (×2): qty 1

## 2022-01-08 MED ORDER — ASPIRIN 325 MG PO TBEC
325.0000 mg | DELAYED_RELEASE_TABLET | Freq: Every day | ORAL | Status: DC
  Administered 2022-01-08 – 2022-01-13 (×6): 325 mg via ORAL
  Filled 2022-01-08 (×6): qty 1

## 2022-01-08 MED ORDER — SODIUM CHLORIDE 0.9% FLUSH: 3.0000 mL | INTRAVENOUS | Status: DC | PRN

## 2022-01-08 MED ORDER — METOCLOPRAMIDE HCL 5 MG/ML IJ SOLN
10.0000 mg | Freq: Four times a day (QID) | INTRAMUSCULAR | Status: AC
  Administered 2022-01-08 – 2022-01-09 (×5): 10 mg via INTRAVENOUS
  Filled 2022-01-08 (×5): qty 2

## 2022-01-08 MED ORDER — OXYCODONE HCL 5 MG PO TABS
5.0000 mg | ORAL_TABLET | ORAL | Status: DC | PRN
  Administered 2022-01-08 – 2022-01-12 (×12): 5 mg via ORAL
  Filled 2022-01-08 (×13): qty 1

## 2022-01-08 MED ORDER — ONDANSETRON HCL 4 MG PO TABS: 4.0000 mg | ORAL_TABLET | Freq: Four times a day (QID) | ORAL | Status: DC | PRN

## 2022-01-08 MED ORDER — METOPROLOL TARTRATE 12.5 MG HALF TABLET
12.5000 mg | ORAL_TABLET | Freq: Two times a day (BID) | ORAL | Status: DC
  Administered 2022-01-08 – 2022-01-12 (×10): 12.5 mg via ORAL
  Filled 2022-01-08 (×13): qty 1

## 2022-01-08 MED ORDER — POTASSIUM CHLORIDE CRYS ER 20 MEQ PO TBCR
20.0000 meq | EXTENDED_RELEASE_TABLET | ORAL | Status: AC
  Administered 2022-01-08 (×3): 20 meq via ORAL
  Filled 2022-01-08 (×3): qty 1

## 2022-01-08 MED ORDER — SODIUM CHLORIDE 0.9 % IV SOLN: 250.0000 mL | INTRAVENOUS | Status: DC | PRN

## 2022-01-08 MED ORDER — TRAMADOL HCL 50 MG PO TABS
50.0000 mg | ORAL_TABLET | ORAL | Status: DC | PRN
  Administered 2022-01-08 – 2022-01-12 (×14): 50 mg via ORAL
  Filled 2022-01-08 (×14): qty 1

## 2022-01-08 MED ORDER — SODIUM CHLORIDE 0.9% FLUSH
3.0000 mL | Freq: Two times a day (BID) | INTRAVENOUS | Status: DC
  Administered 2022-01-08 – 2022-01-13 (×9): 3 mL via INTRAVENOUS

## 2022-01-08 MED FILL — Heparin Sodium (Porcine) Inj 1000 Unit/ML: Qty: 1000 | Status: AC

## 2022-01-08 MED FILL — Lidocaine HCl Local Preservative Free (PF) Inj 2%: INTRAMUSCULAR | Qty: 14 | Status: AC

## 2022-01-08 MED FILL — Potassium Chloride Inj 2 mEq/ML: INTRAVENOUS | Qty: 40 | Status: AC

## 2022-01-08 NOTE — Progress Notes (Signed)
[facility] DAILY ICU PROGRESS NOTE                   [address]            [place] [address]          [phone]   4 Days Post-Op Procedure(s) (LRB): CORONARY ARTERY BYPASS GRAFTING (CABG) x  USING ENDOSCOPIC GREATER SAPHENOUS VEIN HARVEST (N/A) TRANSESOPHAGEAL ECHOCARDIOGRAM (TEE) (N/A) CLIPPING OF ATRIAL APPENDAGE USING SIZE 40 ATRICURE ATRIAL CLIP (N/A)  Total Length of Stay:  LOS: 5 days   Subjective: Up in the chair, walked a partial lap in the unit this morning still limited by dizziness and nausea. Denies abd pain.    Objective: Vital signs in last 24 hours: Temp:  [97.7 F (36.5 C)-98.4 F (36.9 C)] 98.1 F (36.7 C) (10/24 0319) Pulse Rate:  [68-156] 93 (10/24 0700) Cardiac Rhythm: Normal sinus rhythm (10/24 0400) Resp:  [14-31] 16 (10/24 0700) BP: (103-153)/(48-97) 153/74 (10/24 0700) SpO2:  [83 %-100 %] 93 % (10/24 0700) Weight:  [66.8 kg] 66.8 kg (10/24 0500)  Filed Weights   01/06/22 0500 01/07/22 0500 01/08/22 0500  Weight: 68.6 kg 70.7 kg 66.8 kg    Weight change: -3.9 kg     Intake/Output from previous day: 10/23 0701 - 10/24 0700 In: 528 [P.O.:240; I.V.:115.9; IV Piggyback:172.1] Out: 2315 [Urine:2300; Drains:15]  Intake/Output this shift: No intake/output data recorded.  Current Meds: Scheduled Meds:  acetaminophen  1,000 mg Oral Q6H   Or   acetaminophen (TYLENOL) oral liquid 160 mg/5 mL  1,000 mg Per Tube Q6H   acetaminophen (TYLENOL) oral liquid 160 mg/5 mL  650 mg Per Tube Once   Or   acetaminophen  650 mg Rectal Once   amiodarone  200 mg Oral BID   aspirin EC  325 mg Oral Daily   Or   aspirin  324 mg Per Tube Daily   bisacodyl  10 mg Oral Daily   Or   bisacodyl  10 mg Rectal Daily   Chlorhexidine Gluconate Cloth  6 each Topical Q0600   docusate sodium  200 mg Oral Daily   fluticasone  2 spray Each Nare Daily   furosemide  20 mg Intravenous BID   insulin aspart  0-24 Units Subcutaneous Q4H   metoprolol tartrate   12.5 mg Oral BID   Or   metoprolol tartrate  12.5 mg Per Tube BID   mometasone-formoterol  2 puff Inhalation BID   mupirocin ointment  1 Application Nasal BID   pantoprazole  40 mg Oral Daily   rosuvastatin  40 mg Oral Daily   sodium chloride flush  3 mL Intravenous Q12H   Continuous Infusions:  sodium chloride Stopped (01/04/22 2101)   sodium chloride 250 mL (01/06/22 0605)   sodium chloride 10 mL/hr at 01/07/22 0800   dexmedetomidine (PRECEDEX) IV infusion 0.7 mcg/kg/hr (01/05/22 1800)   epinephrine Stopped (01/05/22 0754)   lactated ringers     lactated ringers     lactated ringers 180 mL/hr at 01/07/22 0800   nitroGLYCERIN     norepinephrine (LEVOPHED) Adult infusion Stopped (01/04/22 2326)   phenylephrine (NEO-SYNEPHRINE) Adult infusion 0 mcg/min (01/04/22 1515)   PRN Meds:.sodium chloride, albuterol, lactated ringers, metoprolol tartrate, midazolam, morphine injection, ondansetron (ZOFRAN) IV, oxyCODONE, prochlorperazine, sodium chloride flush, traMADol  General appearance: alert, cooperative, and no distress Neurologic: intact Heart: NSR now, had and episode of atrial fibrillation with RVR  last night treated with a dose of IV  metoprolol.  Lungs: clear, shallow Abdomen: soft, NT, few bowel sounds Extremities: mild peripheral edema. The RLE EVH incision is well approximated and dry.  Wound: the sternal incision is intact and dry  Lab Results: CBC: Recent Labs    01/07/22 0532 01/08/22 0530  WBC 10.3 10.8*  HGB 8.3* 8.8*  HCT 25.3* 25.2*  PLT 125* 190    BMET:  Recent Labs    01/07/22 0532 01/08/22 0530  NA 137 137  K 3.2* 3.2*  CL 101 97*  CO2 28 28  GLUCOSE 113* 97  BUN 16 15  CREATININE 1.01* 0.99  CALCIUM 8.2* 8.4*     CMET: Lab Results  Component Value Date   WBC 10.8 (H) 01/08/2022   HGB 8.8 (L) 01/08/2022   HCT 25.2 (L) 01/08/2022   PLT 190 01/08/2022   GLUCOSE 97 01/08/2022   CHOL 141 01/03/2022   TRIG 82 01/03/2022   HDL 49 01/03/2022    LDLCALC 76 01/03/2022   ALT 16 12/31/2021   AST 21 12/31/2021   NA 137 01/08/2022   K 3.2 (L) 01/08/2022   CL 97 (L) 01/08/2022   CREATININE 0.99 01/08/2022   BUN 15 01/08/2022   CO2 28 01/08/2022   TSH 2.32 02/29/2020   INR 1.6 (H) 01/04/2022      PT/INR:  No results for input(s): "LABPROT", "INR" in the last 72 hours.  Radiology: No results found.   Assessment/Plan: S/P Procedure(s) (LRB): CORONARY ARTERY BYPASS GRAFTING (CABG) x  USING ENDOSCOPIC GREATER SAPHENOUS VEIN HARVEST (N/A) TRANSESOPHAGEAL ECHOCARDIOGRAM (TEE) (N/A) CLIPPING OF ATRIAL APPENDAGE USING SIZE 40 ATRICURE ATRIAL CLIP (N/A)  -POD4 CABG and closure of left atrial appendage. Stable VS. On ASA, low-dose metoprolol, and rosuvastatin. Progressing slowly with ambulation due to nausea and dizziness.    -Post-op atrial fibrillation- began on 10/22 converted to NSR after ~6 hours on IV amiodarone load. Switched to PO amio yesterday.  K+ 3.2, supplement.   -PULM- adequate oxygenation on 1-2LncO2. CXR 10/22 showing some pulmonary edema and chronic interstitial fibrotic changes. Diuresing.  Follow up CXR tomorrow.  -GI- passing gas, no BM yet. Poor appetite, nausea and dizziness may be related to the amiodarone. Will try scopolamine patch.   -Renal- normal function at baseline. Good response to diuresis.   -HEME- expected acute blood loss anemia- stable and tolerating well. Monitor.   -DVT PPX- using SCD's, ambulate as tolerated.   -Disposition- Transfer to 4E [facility] Care when bed available. PT/OT consults.    Antony Odea, PA-C [phone] 01/08/2022 7:44 AM

## 2022-01-08 NOTE — Discharge Instructions (Addendum)
Discharge Instructions:  1. You may shower, please wash incisions daily with soap and water and keep dry.  If you wish to cover wounds with dressing you may do so but please keep clean and change daily.  No tub baths or swimming until incisions have completely healed.  If your incisions become red or develop any drainage please call our office at [phone] 2. No Driving until cleared by Dr. Binnie Kand office and you are no longer using narcotic pain medications  3. Monitor your weight daily.. Please use the same scale and weigh at same time... If you gain 5-10 lbs in 48 hours with associated lower extremity swelling, please contact our office at [phone]  4. Fever of 101.5 for at least 24 hours with no source, please contact our office at [phone]  5. Activity- up as tolerated, please walk at least 3 times per day.  Avoid strenuous activity, no lifting, pushing, or pulling with your arms over 8-10 lbs for a minimum of 6 weeks  6. If any questions or concerns arise, please do not hesitate to contact our office at [phone]   Information on my medicine - ELIQUIS (apixaban)  Why was Eliquis prescribed for you? Eliquis was prescribed for you to reduce the risk of a blood clot forming that can cause a stroke if you have a medical condition called atrial fibrillation (a type of irregular heartbeat).  What do You need to know about Eliquis ? Take your Eliquis TWICE DAILY - one tablet in the morning and one tablet in the evening with or without food. If you have difficulty swallowing the tablet whole please discuss with your pharmacist how to take the medication safely.  Take Eliquis exactly as prescribed by your doctor and DO NOT stop taking Eliquis without talking to the doctor who prescribed the medication.  Stopping may increase your risk of developing a stroke.  Refill your prescription before you run out.  After discharge, you should have regular check-up appointments with  your healthcare provider that is prescribing your Eliquis.  In the future your dose may need to be changed if your kidney function or weight changes by a significant amount or as you get older.  What do you do if you miss a dose? If you miss a dose, take it as soon as you remember on the same day and resume taking twice daily.  Do not take more than one dose of ELIQUIS at the same time to make up a missed dose.  Important Safety Information A possible side effect of Eliquis is bleeding. You should call your healthcare provider right away if you experience any of the following: Bleeding from an injury or your nose that does not stop. Unusual colored urine (red or dark brown) or unusual colored stools (red or black). Unusual bruising for unknown reasons. A serious fall or if you hit your head (even if there is no bleeding).  Some medicines may interact with Eliquis and might increase your risk of bleeding or clotting while on Eliquis. To help avoid this, consult your healthcare provider or pharmacist prior to using any new prescription or non-prescription medications, including herbals, vitamins, non-steroidal anti-inflammatory drugs (NSAIDs) and supplements.  This website has more information on Eliquis (apixaban): http://www.eliquis.com/eliquis/home

## 2022-01-08 NOTE — Progress Notes (Signed)
Pt admitted to rm 19 from [place]. CHG wipe given. Initiated tele. VSS. Call bell within reach.   Lavenia Atlas, RN

## 2022-01-08 NOTE — Discharge Summary (Addendum)
Physician Discharge Summary  Patient ID: Kristen Suarez MRN: [id number] DOB/AGE: Nov 29, 1945 76 y.o.  Admit date: 12/31/2021 Discharge date: 01/13/2022  Admission Diagnoses: Syncope History of sarcoidosis Hypertension Dyslipidemia Coronary artery disease Renal artery stenosis CKD, stage 3b History of recurring syncope with associated falls  Discharge Diagnoses:  Syncope History of sarcoidosis Hypertension Dyslipidemia Coronary artery disease Renal artery stenosis CKD, stage 3b S/P CABG and application of left atrial appendage clip Postoperative paroxysmal atrial fibrillation Hypokalemia Hypomagnesemia History of recurring syncope with associated falls   Discharged Condition: stable   History of Present Illness: At the time of CT surgical consultation   we are asked to see this 76 year old female in cardiothoracic surgical consultation for consideration of coronary artery surgical revascularization.  The patient presented to the emergency department yesterday following an episode of syncope.  He reports that during her yoga class she had a wave of feeling hot and became diaphoretic.  He has had a similar episode in the past so he decided to sit on the floor and then proceeded to pass out.  Reportedly she was out for at least a few seconds and upon waking she was very dizzy.  She denied chest pain or shortness of breath.  Reports of recurrent syncope over the past 20 years but notes that the episodes are lasting longer and are more difficult to recover from.  She has had a loop recorder placed in the past.  In the emergency room a CT of the head was obtained and it was unremarkable.  Her chest x-ray was unremarkable.  She was felt to have signs of clinical dehydration and was given IV fluids.  Her EKG was unremarkable.  Is noted the patient did have a cardiac CT scan recently and was found to have coronary stenosis so cardiology consultation was obtained and recommendations for  admission under the hospitalist with plans for further cardiology evaluation.  It is noted the CTA showed the markedly elevated calcium score of 1527.  this was to include cardiac catheterization.  This was performed today and she is found to have severe three-vessel coronary artery disease.  Ejection fraction is noted to be 60 to 65% with mid cavitary gradient due to hyperdynamic LV function and grade 1 diastolic dysfunction.  Please see the full report below.  Echocardiogram is also been obtained.  This shows LVEF EF by estimation to be greater than 75%.  Ventricular function is mildly reduced.  The aortic valve is tricuspid but poorly visualized.  There is also noted to be trivial mitral valve regurgitation.  A small PFO could not be excluded.  Please see the full report below.  Additional cardiac risk factors include hyperlipidemia and hypertension.  She is also noted to have a sarcoid.  She is not on steroids.  He has a history of renal artery restenosis with a creatinine is noted to be in the normal range on 01/02/2022 but she does have a history of chronic kidney disease stage IIIb.    Hospital course: Following diagnostic evaluation and medical stabilization, the patient and all relevant studies were reviewed by Dr. Leodis Liverpool who recommended proceeding with CABG.  On 01/04/2022 she was taken the operating room at which time she underwent coronary artery bypass grafting x4.  She tolerated the procedure well was taken the surgical intensive care unit in stable condition.  Operative hospital course:  Patient has done well.  She was weaned from the ventilator without difficulty using standard post cardiac surgical protocols.  Swan-Ganz was  removed on postoperative day #1 as per chest tubes.  He is noted to have some postoperative expected volume overload and was started on a course of IV diuretics initially.  Central line was removed on postoperative day #2.  She has been started on aspirin.  He is noted to  have an expected acute blood loss anemia which is stable and being monitored clinically.  Renal function is noted to show a slightly elevated creatinine and this was monitored closely and remained reasonably stable.  She had persistent dizziness with nausea and poor appetite that was treated with a transdermal scopolamine patch.  She developed atrial fibrillation treated with IV amiodarone loading and eventual transition to oral amiodarone. She was transferred to 4E [facility] Care on post-op day 4.  She continued to have some paroxysmal atrial fibrillation but we elected not to start an anticoagulant due to her history of multiple syncopal episodes associated with falls.  She was given an additional bolus of IV amiodarone on 01/10/2022 for A-fib rapid ventricular response.  She converted back to stable sinus rhythm shortly thereafter.  She continued to maintain SR. She diuresed with good response.  Respiratory status improved allowing her to be weaned from the supplemental oxygen.  She had slow, gradual improvement with her mobility.  She did have constipation and was given multiple laxatives. EPW were removed on 10/28. Patient had no history of diabetes and glucose less than 147. Accu checks and SS PRN were stopped on 10/28. She required multiple laxatives and an enema and finally had a bowel movement. Stool softeners were stopped as stools loose. She was more hypertensive so Lopressor was increased to 25 mg bid and she was restarted on Amlodipine. She had complaints of hearing changes/loss first in right ear then in left. Per patient, otoscope was used. ? Etiology. Patient instructed if does not resolve or worsens, to contact PCP. H and H was stable on 10/29 at 9.4 and 29.3. WBC was elevated at 15,300. She was afebrile, no wound infection,  no pneumonia on chest  ray. UA was checked an showed trace leukocytes and rare bacteria. As discussed with surgeon, will obtain CBC as outpatient  to follow WBC. By the time  of discharge, she was ambulating in the hall with assistance utilizing a rolling walker.  Physical therapy and Occupational Therapy staff's recommended home health PT along with a rolling walker.  This was all arranged as recommended.  Consults: None  Significant Diagnostic Studies:  CLINICAL DATA:  Status post CABG.   EXAM: CHEST - 2 VIEW   COMPARISON:  Chest x-ray dated January 09, 2022.   FINDINGS: Stable cardiomediastinal silhouette status post CABG and left atrial appendage clipping. Loop recorder again noted. Unchanged chronic irregular interstitial opacities and bronchiectasis in both upper lobes. Unchanged small right pleural effusion with mild right basilar atelectasis. No pneumothorax. Postsurgical changes in the right lung again noted. No acute osseous abnormality.   IMPRESSION: 1. Unchanged small right pleural effusion with mild right basilar atelectasis. 2. Similar chronic changes related to underlying sarcoidosis.     Electronically Signed   By: Titus Dubin M.D.   On: 01/11/2022 15:33  Treatments: 01/04/22 PROCEDURE:   CORONARY ARTERY BYPASS GRAFTING (CABG) USING ENDOSCOPIC GREATER SAPHENOUS VEIN HARVEST (N/A) TRANSESOPHAGEAL ECHOCARDIOGRAM (TEE) (N/A) CLIPPING OF ATRIAL APPENDAGE USING SIZE 40 ATRICURE ATRIAL CLIP SURGEON:  Enter, Pierre Bali, MD - Primary  Discharge Exam: Blood pressure (!) 159/82, pulse 89, temperature 98.1 F (36.7 C), temperature source Oral, resp. rate Marland Kitchen)  21, height 5' 2" (1.575 m), weight 64.5 kg, SpO2 93 %. Cardiovascular: RRR Pulmonary: Clear to auscultation bilaterally Abdomen: Soft, non tender, bowel sounds present. Extremities: Trace bilateral lower extremity edema. Wounds: Clean and dry.  No erythema or signs of infection.  Disposition: Discharge disposition: 01-Home or Self Care       Discharge Instructions     Amb Referral to Cardiac Rehabilitation   Complete by: As directed    Diagnosis: CABG   CABG X ___: 3    After initial evaluation and assessments completed: Virtual Based Care may be provided alone or in conjunction with Phase 2 Cardiac Rehab based on patient barriers.: Yes   Intensive Cardiac Rehabilitation (ICR) [place] location only OR Traditional Cardiac Rehabilitation (TCR) *If criteria for ICR are not met will enroll in TCR [facility] only): Yes      Allergies as of 01/13/2022       Reactions   Keflex [cephalexin] Rash   Penicillins Itching   Sulfa Antibiotics Itching        Medication List     STOP taking these medications    amLODIPine-Valsartan-HCTZ 10-160-12.5 MG Tabs Commonly known as: Exforge HCT   baclofen 10 MG tablet Commonly known as: LIORESAL   famotidine 10 MG tablet Commonly known as: PEPCID       TAKE these medications    acetaminophen 500 MG tablet Commonly known as: TYLENOL Take 500 mg by mouth every 6 (six) hours as needed for moderate pain.   albuterol (2.5 MG/3ML) 0.083% nebulizer solution Commonly known as: PROVENTIL Take 3 mLs (2.5 mg total) by nebulization every 6 (six) hours as needed for wheezing or shortness of breath. USE 1 VIAL VIA NEBULIZER EVERY 6 HOURS Strength: (2.5 MG/3ML) 0.083% What changed: Another medication with the same name was removed. Continue taking this medication, and follow the directions you see here.   amiodarone 200 MG tablet Commonly known as: PACERONE Take 1 tablet (200 mg total) by mouth 2 (two) times daily. For 14 days then reduce the dose to 1 tablet or 218m once daily.   amLODipine 5 MG tablet Commonly known as: NORVASC Take 1 tablet (5 mg total) by mouth daily. Start taking on: January 14, 2022   aspirin EC 325 MG tablet Take 1 tablet (325 mg total) by mouth daily. What changed:  medication strength how much to take additional instructions   Breztri Aerosphere 160-9-4.8 MCG/ACT Aero Generic drug: Budeson-Glycopyrrol-Formoterol Inhale 2 puffs into the lungs in the morning and at bedtime.   CALCIUM 600 + D  PO Take 1 tablet by mouth daily.   cetirizine 10 MG tablet Commonly known as: ZYRTEC Take 10 mg by mouth daily as needed for allergies.   docusate sodium 100 MG capsule Commonly known as: COLACE Take 100 mg by mouth daily.   empagliflozin 10 MG Tabs tablet Commonly known as: [name] Take 1 tablet (10 mg total) by mouth daily before breakfast.   furosemide 40 MG tablet Commonly known as: LASIX Take 1 tablet (40 mg total) by mouth daily. For 3 days then stop. Start taking on: January 14, 2022   metoprolol tartrate 25 MG tablet Commonly known as: LOPRESSOR Take 1 tablet (25 mg total) by mouth 2 (two) times daily. What changed:  medication strength how much to take when to take this additional instructions   mometasone 50 MCG/ACT nasal spray Commonly known as: NASONEX SHAKE LIQUID AND USE 2 SPRAYS IN EACH NOSTRIL DAILY   MULTIVITAL PO Take 1 tablet by  mouth daily.   pantoprazole 40 MG tablet Commonly known as: PROTONIX TAKE 1 TABLET BY MOUTH EVERY DAY   PROBIOTIC DAILY PO Take 1 tablet by mouth daily.   rosuvastatin 40 MG tablet Commonly known as: CRESTOR TAKE 1 TABLET BY MOUTH EVERY DAY   sodium chloride 0.65 % Soln nasal spray Commonly known as: OCEAN Place 1 spray into both nostrils as needed for congestion.   traMADol 50 MG tablet Commonly known as: ULTRAM Take 1 tablet (50 mg total) by mouth every 6 (six) hours as needed for up to 5 days for moderate pain.               Durable Medical Equipment  (From admission, onward)           Start     Ordered   01/10/22 0903  For home use only DME Walker rolling  Once       Question Answer Comment  Walker: With [employer] Wheels   Patient needs a walker to treat with the following condition Imbalance   Patient needs a walker to treat with the following condition S/P CABG x 3      01/09/22 0905   01/10/22 0902  For home use only DME Bedside commode  Once       Question:  Patient needs a bedside  commode to treat with the following condition  Answer:  Imbalance   01/09/22 0905            Follow-up Information     [facility] Follow up.   Why: (Adapt)- Rolling walker and bedside commode arranged- to be delivered to room prior to discharge Contact information: [place] [address] [phone]         [facility] Follow up.   Why: Latricia Heft)- HHPT arranged- they will contact you post discharge to schedule Contact information: [place] [address] [phone]         Enter, Pierre Bali, MD. Go on 01/17/2022.   Specialties: Cardiothoracic Surgery, Cardiology Why: Your appointment is at 2 PM. Report to [facility] located at [facility] 1 hour prior to the appointment for chest x-ray Contact information: [address] [place] [address] [phone]         Loel Dubonnet, NP. Go on 01/29/2022.   Specialty: Cardiology Why: Your appointment is at 10:05 AM Contact information: [place] [address] [phone]                 The patient has been discharged on:   1.Beta Blocker:  Yes [  x ]                              No   [   ]                              If No, reason:  2.Ace Inhibitor/ARB: Yes [   ]                                     No  [  x  ]  If No, reason: soft BP  3.Statin:   Yes [ x  ]                  No  [   ]                  If No, reason:  4.Ecasa:  Yes  [ x  ]                  No   [   ]                  If No, reason:  5. ACS on Admission?  P2Y12 Inhibitor:  Yes  [   ]                                No  [ x ]    Signed: Nani Skillern, PA-C 01/13/2022, 10:45 AM

## 2022-01-08 NOTE — Evaluation (Signed)
Physical Therapy Evaluation Patient Details Name: Kristen Suarez MRN: [id number] DOB: 04/28/1945 Today's Date: 01/08/2022  History of Present Illness  76 yo female admitted to ED 10/16 after syncope during chair yoga at [facility]. 10/18 cath with severe 3 vessel CAD. 10/20 CABG. PMhx: HTN, HLD, sarcoidosis, CAD  Clinical Impression  Pt pleasant and aware of 2/4 precautions with handout provided as spouse took surgical book home and education for all precautions.  Pt normally independent and active in different classes at the gym 3x/wk. Pt does not use AD in the house but utilizes cane in the yard. Pt with decreased transfers, gait, functional mobility and decreased awareness of precautions who will benefit from acute therapy to maximize mobility, safety and independence.   HR 83-86 SPO2 88-93% on RA Pre gait 140/63 Post gait 117/63      Recommendations for follow up therapy are one component of a multi-disciplinary discharge planning process, led by the attending physician.  Recommendations may be updated based on patient status, additional functional criteria and insurance authorization.  Follow Up Recommendations Home health PT      Assistance Recommended at Discharge Intermittent Supervision/Assistance  Patient can return home with the following  A little help with walking and/or transfers;A little help with bathing/dressing/bathroom;Assistance with cooking/housework;Assist for transportation;Help with stairs or ramp for entrance    Equipment Recommendations Rolling walker (2 wheels);BSC/3in1  Recommendations for Other Services       Functional Status Assessment Patient has had a recent decline in their functional status and demonstrates the ability to make significant improvements in function in a reasonable and predictable amount of time.     Precautions / Restrictions Precautions Precautions: Sternal;Fall Precaution Booklet Issued: Yes (comment)      Mobility  Bed  Mobility Overal bed mobility: Needs Assistance Bed Mobility: Rolling, Sidelying to Sit Rolling: Min assist Sidelying to sit: Min assist, HOB elevated       General bed mobility comments: HOB 20 degrees, cues for sequence and precautions with assist of pad to fully rotate hips and physical assist to rise    Transfers Overall transfer level: Needs assistance   Transfers: Sit to/from Stand Sit to Stand: Min guard           General transfer comment: minguard with cues for hand placement to rise from bed and toilet    Ambulation/Gait Ambulation/Gait assistance: Min guard Gait Distance (Feet): 200 Feet Assistive device: Rolling walker (2 wheels) Gait Pattern/deviations: Step-through pattern, Decreased stride length   Gait velocity interpretation: <1.8 ft/sec, indicate of risk for recurrent falls   General Gait Details: slow, cautious gait with cues for proximity to RW and breathing technique. SpO2 88-93% on RA  Stairs            Wheelchair Mobility    Modified Rankin (Stroke Patients Only)       Balance Overall balance assessment: Needs assistance Sitting-balance support: No upper extremity supported, Feet supported Sitting balance-Leahy Scale: Fair Sitting balance - Comments: EOB and toilet without support   Standing balance support: Bilateral upper extremity supported, Reliant on assistive device for balance Standing balance-Leahy Scale: Fair Standing balance comment: Rw in standing, able to static stand at sink                             Pertinent Vitals/Pain Pain Assessment Pain Assessment: 0-10 Pain Score: 3  Pain Location: incisional Pain Descriptors / Indicators: Aching, Guarding Pain Intervention(s): Limited activity within  patient's tolerance, Monitored during session, Repositioned    Home Living Family/patient expects to be discharged to:: Private residence Living Arrangements: Spouse/significant other Available Help at Discharge:  Family;Available 24 hours/day Type of Home: House Home Access: Stairs to enter   [employer] of Steps: 2   Home Layout: One level Home Equipment: Cane - single point      Prior Function Prior Level of Function : Independent/Modified Independent                     Hand Dominance        Extremity/Trunk Assessment   Upper Extremity Assessment Upper Extremity Assessment: Generalized weakness    Lower Extremity Assessment Lower Extremity Assessment: Generalized weakness    Cervical / Trunk Assessment Cervical / Trunk Assessment: Normal  Communication   Communication: No difficulties  Cognition Arousal/Alertness: Awake/alert Behavior During Therapy: WFL for tasks assessed/performed Overall Cognitive Status: Within Functional Limits for tasks assessed                                          General Comments      Exercises     Assessment/Plan    PT Assessment Patient needs continued PT services  PT Problem List Decreased strength;Decreased mobility;Decreased activity tolerance;Decreased balance;Decreased knowledge of use of DME;Cardiopulmonary status limiting activity;Decreased knowledge of precautions       PT Treatment Interventions Gait training;Balance training;Stair training;Functional mobility training;Therapeutic activities;Patient/family education;DME instruction;Therapeutic exercise    PT Goals (Current goals can be found in the Care Plan section)  Acute Rehab PT Goals Patient Stated Goal: return home, exercise, read and paint PT Goal Formulation: With patient Time For Goal Achievement: 01/22/22 Potential to Achieve Goals: Good    Frequency Min 3X/week     Co-evaluation               AM-PAC PT "6 Clicks" Mobility  Outcome Measure Help needed turning from your back to your side while in a flat bed without using bedrails?: A Little Help needed moving from lying on your back to sitting on the side of a flat  bed without using bedrails?: A Little Help needed moving to and from a bed to a chair (including a wheelchair)?: A Little Help needed standing up from a chair using your arms (e.g., wheelchair or bedside chair)?: A Little Help needed to walk in hospital room?: A Little Help needed climbing 3-5 steps with a railing? : A Lot 6 Click Score: 17    End of Session Equipment Utilized During Treatment: Gait belt Activity Tolerance: Patient tolerated treatment well Patient left: in chair;with call bell/phone within reach Nurse Communication: Mobility status PT Visit Diagnosis: Other abnormalities of gait and mobility (R26.89);Difficulty in walking, not elsewhere classified (R26.2)    Time: 3734-2876 PT Time Calculation (min) (ACUTE ONLY): 31 min   Charges:   PT Evaluation $PT Eval Moderate Complexity: 1 Mod PT Treatments $Therapeutic Activity: 8-22 mins        Bayard Males, PT Acute Rehabilitation Services Office: [phone]   Sandy Salaam Jory Tanguma 01/08/2022, 1:38 PM

## 2022-01-08 NOTE — Progress Notes (Addendum)
CARDIAC REHAB PHASE I   Pt resting in chair with family at bedside. Pt feeling tired from working with PT. Overall reports tolerating walk well. Encouraged increased ambulation, oob to chair and IS use.Discussed proper sternal precautions and move in the tub. Pt verbalized understanding. Will continue to follow.    [id number] Vanessa Barbara, RN BSN 01/08/2022 2:51 PM

## 2022-01-09 ENCOUNTER — Other Ambulatory Visit (HOSPITAL_BASED_OUTPATIENT_CLINIC_OR_DEPARTMENT_OTHER): Payer: Self-pay | Admitting: Cardiovascular Disease

## 2022-01-09 ENCOUNTER — Encounter (HOSPITAL_BASED_OUTPATIENT_CLINIC_OR_DEPARTMENT_OTHER): Payer: Self-pay

## 2022-01-09 ENCOUNTER — Inpatient Hospital Stay (HOSPITAL_COMMUNITY): Payer: Medicare Other

## 2022-01-09 LAB — CBC WITH DIFFERENTIAL/PLATELET
Abs Immature Granulocytes: 0.07 10*3/uL (ref 0.00–0.07)
Basophils Absolute: 0 10*3/uL (ref 0.0–0.1)
Basophils Relative: 0 %
Eosinophils Absolute: 0.2 10*3/uL (ref 0.0–0.5)
Eosinophils Relative: 2 %
HCT: 27.4 % — ABNORMAL LOW (ref 36.0–46.0)
Hemoglobin: 8.8 g/dL — ABNORMAL LOW (ref 12.0–15.0)
Immature Granulocytes: 1 %
Lymphocytes Relative: 19 %
Lymphs Abs: 1.8 10*3/uL (ref 0.7–4.0)
MCH: 29.7 pg (ref 26.0–34.0)
MCHC: 32.1 g/dL (ref 30.0–36.0)
MCV: 92.6 fL (ref 80.0–100.0)
Monocytes Absolute: 1 10*3/uL (ref 0.1–1.0)
Monocytes Relative: 11 %
Neutro Abs: 6.2 10*3/uL (ref 1.7–7.7)
Neutrophils Relative %: 67 %
Platelets: 258 10*3/uL (ref 150–400)
RBC: 2.96 MIL/uL — ABNORMAL LOW (ref 3.87–5.11)
RDW: 13.8 % (ref 11.5–15.5)
WBC: 9.4 10*3/uL (ref 4.0–10.5)
nRBC: 0 % (ref 0.0–0.2)

## 2022-01-09 LAB — BASIC METABOLIC PANEL
Anion gap: 7 (ref 5–15)
BUN: 15 mg/dL (ref 8–23)
CO2: 32 mmol/L (ref 22–32)
Calcium: 8.6 mg/dL — ABNORMAL LOW (ref 8.9–10.3)
Chloride: 98 mmol/L (ref 98–111)
Creatinine, Ser: 1.08 mg/dL — ABNORMAL HIGH (ref 0.44–1.00)
GFR, Estimated: 53 mL/min — ABNORMAL LOW (ref >60)
Glucose, Bld: 105 mg/dL — ABNORMAL HIGH (ref 70–99)
Potassium: 3.8 mmol/L (ref 3.5–5.1)
Sodium: 137 mmol/L (ref 135–145)

## 2022-01-09 LAB — GLUCOSE, CAPILLARY
Glucose-Capillary: 96 mg/dL (ref 70–99)
Glucose-Capillary: 110 mg/dL — ABNORMAL HIGH (ref 70–99)
Glucose-Capillary: 107 mg/dL — ABNORMAL HIGH (ref 70–99)
Glucose-Capillary: 118 mg/dL — ABNORMAL HIGH (ref 70–99)

## 2022-01-09 LAB — MAGNESIUM: Magnesium: 2.2 mg/dL (ref 1.7–2.4)

## 2022-01-09 MED ORDER — MELATONIN 3 MG PO TABS
3.0000 mg | ORAL_TABLET | Freq: Every day | ORAL | Status: DC
  Administered 2022-01-09 – 2022-01-12 (×4): 3 mg via ORAL
  Filled 2022-01-09 (×4): qty 1

## 2022-01-09 NOTE — Progress Notes (Signed)
Mobility Specialist Progress Note:   01/09/22 1029  Mobility  Activity Ambulated with assistance in hallway  Level of Assistance Contact guard assist, steadying assist  Assistive Device Front wheel walker  Distance Ambulated (ft) 60 ft  Activity Response Tolerated well  $Mobility charge 1 Mobility   Pt received in bed willing to participate in mobility.No complaints of pain. Instructed to move in the tube and log rolling to get ou of bed. Left EOB with call bell in reach and all needs met.   Post Mobility:   93 HR; 166/84 BP  Kristen Suarez Air traffic controller only

## 2022-01-09 NOTE — Telephone Encounter (Signed)
No problem, if you will please just let Kristen Suarez know. Thanks!

## 2022-01-09 NOTE — Telephone Encounter (Signed)
Rx request sent to pharmacy.  

## 2022-01-09 NOTE — Progress Notes (Signed)
CARDIAC REHAB PHASE I    Pt ambulated with mobility this morning. Per pt tolerated well but feeling tired after. Pt in bed resting. Encouraged increased oob to chair, two more walks today and frequent IS use. Pt verbalized understanding. Will return later today for walk as time permits. Will continue to follow.    [id number] Vanessa Barbara, RN BSN 01/09/2022 10:59 AM

## 2022-01-09 NOTE — Progress Notes (Signed)
[facility] DAILY ICU PROGRESS NOTE                   [address]            [place] [address]          [phone]   5 Days Post-Op Procedure(s) (LRB): CORONARY ARTERY BYPASS GRAFTING (CABG) x  USING ENDOSCOPIC GREATER SAPHENOUS VEIN HARVEST (N/A) TRANSESOPHAGEAL ECHOCARDIOGRAM (TEE) (N/A) CLIPPING OF ATRIAL APPENDAGE USING SIZE 40 ATRICURE ATRIAL CLIP (N/A)  Total Length of Stay:  LOS: 6 days   Subjective: Felling much better today, dizziness and nausea resolved. Progressing slowly with mobility.  No BM yet.  Objective: Vital signs in last 24 hours: Temp:  [97.6 F (36.4 C)-99.2 F (37.3 C)] 98.3 F (36.8 C) (10/25 0815) Pulse Rate:  [77-102] 102 (10/25 0815) Cardiac Rhythm: Normal sinus rhythm (10/25 0700) Resp:  [12-23] 16 (10/25 0815) BP: (111-161)/(57-89) 144/73 (10/25 0815) SpO2:  [87 %-100 %] 99 % (10/25 0815) Weight:  [68.3 kg] 68.3 kg (10/25 0530)  Filed Weights   01/07/22 0500 01/08/22 0500 01/09/22 0530  Weight: 70.7 kg 66.8 kg 68.3 kg    Weight change: 1.466 kg     Intake/Output from previous day: 10/24 0701 - 10/25 0700 In: 470 [P.O.:460; I.V.:10] Out: 645 [Urine:600; Drains:45]  Intake/Output this shift: Total I/O In: 240 [P.O.:240] Out: 500 [Urine:500]  Current Meds: Scheduled Meds:  amiodarone  200 mg Oral BID   aspirin EC  325 mg Oral Daily   Chlorhexidine Gluconate Cloth  6 each Topical Daily   fluticasone  2 spray Each Nare Daily   insulin aspart  0-24 Units Subcutaneous TID AC & HS   metoCLOPramide (REGLAN) injection  10 mg Intravenous Q6H   metoprolol tartrate  12.5 mg Oral BID   rosuvastatin  40 mg Oral Daily   scopolamine  1 patch Transdermal Q72H   sodium chloride flush  3 mL Intravenous Q12H   Continuous Infusions:  sodium chloride     PRN Meds:.sodium chloride, albuterol, ondansetron **OR** ondansetron (ZOFRAN) IV, oxyCODONE, prochlorperazine, sodium chloride flush, traMADol  General appearance: alert,  cooperative, and no distress Neurologic: intact Heart: NSR now, had a brief episode of atrial fibrillation with RVR  last night that converted spontaneously to SR.  Lungs: clear, shallow Abdomen: soft, NT, few bowel sounds Extremities: mild peripheral edema. The RLE EVH incision is well approximated and dry.  Wound: the sternal incision is intact and dry  Lab Results: CBC: Recent Labs    01/08/22 0919 01/09/22 0350  WBC 9.1 9.4  HGB 8.9* 8.8*  HCT 27.5* 27.4*  PLT 194 258    BMET:  Recent Labs    01/08/22 0530 01/09/22 0350  NA 137 137  K 3.2* 3.8  CL 97* 98  CO2 28 32  GLUCOSE 97 105*  BUN 15 15  CREATININE 0.99 1.08*  CALCIUM 8.4* 8.6*     CMET: Lab Results  Component Value Date   WBC 9.4 01/09/2022   HGB 8.8 (L) 01/09/2022   HCT 27.4 (L) 01/09/2022   PLT 258 01/09/2022   GLUCOSE 105 (H) 01/09/2022   CHOL 141 01/03/2022   TRIG 82 01/03/2022   HDL 49 01/03/2022   LDLCALC 76 01/03/2022   ALT 16 12/31/2021   AST 21 12/31/2021   NA 137 01/09/2022   K 3.8 01/09/2022   CL 98 01/09/2022   CREATININE 1.08 (H) 01/09/2022   BUN 15 01/09/2022   CO2 32 01/09/2022  TSH 2.32 02/29/2020   INR 1.6 (H) 01/04/2022      PT/INR:  No results for input(s): "LABPROT", "INR" in the last 72 hours.  Radiology: No results found.   Assessment/Plan: S/P Procedure(s) (LRB): CORONARY ARTERY BYPASS GRAFTING (CABG) x  USING ENDOSCOPIC GREATER SAPHENOUS VEIN HARVEST (N/A) TRANSESOPHAGEAL ECHOCARDIOGRAM (TEE) (N/A) CLIPPING OF ATRIAL APPENDAGE USING SIZE 40 ATRICURE ATRIAL CLIP (N/A)  -POD5 CABG and closure of left atrial appendage. Stable VS. On ASA, low-dose metoprolol, and rosuvastatin. Progressing slowly with ambulation.   -Post-op atrial fibrillation- began on 10/22 converted to NSR after ~6 hours on IV amiodarone load. Switched to PO amio 10/23. Had another brief episode of PAF last night but stable SR since. Will continue to monitor, will need to anticoagulate if PAF  persists.    K+ 3.8, supplementing.   -PULM- adequate oxygenation on RA. CXR today still showing some pulmonary edema and chronic interstitial fibrotic changes. Wt still several Kg+. Needs more diuresis.    -GI- passing gas, no BM yet.  Nausea and dizziness improved. Miralax today.   -Renal- normal function at baseline.   -HEME- expected acute blood loss anemia- stable and tolerating well. Monitor.   -DVT PPX- using SCD's, ambulate as tolerated.   -Disposition- Plan eventual discharge to home. Has good family support.  PT recommends HHPT, BSC, and walker.    Antony Odea, PA-C [phone] 01/09/2022 8:51 AM

## 2022-01-09 NOTE — Progress Notes (Signed)
Mobility Specialist Progress Note:   01/09/22 1519  Mobility  Activity Ambulated with assistance in room;Ambulated with assistance in hallway  Level of Assistance Contact guard assist, steadying assist  Assistive Device Front wheel walker  Distance Ambulated (ft) 80 ft  Activity Response Tolerated well  $Mobility charge 1 Mobility   Pt received in bathroom asking to ambulate again. No complaints of pain. Pt had 1 LOB but was able to self correct. Left in chair with call bell in reach and all needs met.   [employer] [facility] Surveyor, mining Chat only

## 2022-01-09 NOTE — Care Management Important Message (Signed)
Important Message  Patient Details  Name: VAANI MORREN MRN: [id number] Date of Birth: 07-02-1945   Medicare Important Message Given:  Yes     Shelda Altes 01/09/2022, 7:53 AM

## 2022-01-09 NOTE — Telephone Encounter (Signed)
It is routine to follow up with cardiology a few weeks after hospital discharge post CABG. She should keep appointment as scheduled.   For future reference - if you look and see that the appointment was scheduled by 'Gay Filler' she is on our inpatient team and ensures patients have their post discharge appointments.   Thanks,  Loel Dubonnet, NP

## 2022-01-10 LAB — ECHO INTRAOPERATIVE TEE
AV Mean grad: 10 mmHg
AV Peak grad: 21.5 mmHg
Ao pk vel: 2.32 m/s
Height: 62 in
Weight: 2310.42 oz

## 2022-01-10 LAB — CBC WITH DIFFERENTIAL/PLATELET
Abs Immature Granulocytes: 0.1 10*3/uL — ABNORMAL HIGH (ref 0.00–0.07)
Basophils Absolute: 0.1 10*3/uL (ref 0.0–0.1)
Basophils Relative: 1 %
Eosinophils Absolute: 0.3 10*3/uL (ref 0.0–0.5)
Eosinophils Relative: 3 %
HCT: 25.1 % — ABNORMAL LOW (ref 36.0–46.0)
Hemoglobin: 8.2 g/dL — ABNORMAL LOW (ref 12.0–15.0)
Immature Granulocytes: 1 %
Lymphocytes Relative: 17 %
Lymphs Abs: 1.5 10*3/uL (ref 0.7–4.0)
MCH: 30.3 pg (ref 26.0–34.0)
MCHC: 32.7 g/dL (ref 30.0–36.0)
MCV: 92.6 fL (ref 80.0–100.0)
Monocytes Absolute: 1.1 10*3/uL — ABNORMAL HIGH (ref 0.1–1.0)
Monocytes Relative: 12 %
Neutro Abs: 5.8 10*3/uL (ref 1.7–7.7)
Neutrophils Relative %: 66 %
Platelets: 250 10*3/uL (ref 150–400)
RBC: 2.71 MIL/uL — ABNORMAL LOW (ref 3.87–5.11)
RDW: 13.9 % (ref 11.5–15.5)
WBC: 8.7 10*3/uL (ref 4.0–10.5)
nRBC: 0.2 % (ref 0.0–0.2)

## 2022-01-10 LAB — GLUCOSE, CAPILLARY
Glucose-Capillary: 101 mg/dL — ABNORMAL HIGH (ref 70–99)
Glucose-Capillary: 127 mg/dL — ABNORMAL HIGH (ref 70–99)
Glucose-Capillary: 109 mg/dL — ABNORMAL HIGH (ref 70–99)
Glucose-Capillary: 110 mg/dL — ABNORMAL HIGH (ref 70–99)

## 2022-01-10 LAB — BASIC METABOLIC PANEL
Anion gap: 11 (ref 5–15)
BUN: 14 mg/dL (ref 8–23)
CO2: 30 mmol/L (ref 22–32)
Calcium: 8.5 mg/dL — ABNORMAL LOW (ref 8.9–10.3)
Chloride: 96 mmol/L — ABNORMAL LOW (ref 98–111)
Creatinine, Ser: 1.05 mg/dL — ABNORMAL HIGH (ref 0.44–1.00)
GFR, Estimated: 55 mL/min — ABNORMAL LOW (ref >60)
Glucose, Bld: 111 mg/dL — ABNORMAL HIGH (ref 70–99)
Potassium: 3.8 mmol/L (ref 3.5–5.1)
Sodium: 137 mmol/L (ref 135–145)

## 2022-01-10 LAB — MAGNESIUM: Magnesium: 1.9 mg/dL (ref 1.7–2.4)

## 2022-01-10 MED ORDER — LACTULOSE 10 GM/15ML PO SOLN: 20.0000 g | Freq: Once | ORAL | Status: DC

## 2022-01-10 MED ORDER — AMIODARONE IV BOLUS ONLY 150 MG/100ML
150.0000 mg | Freq: Once | INTRAVENOUS | Status: AC
  Administered 2022-01-10: 150 mg via INTRAVENOUS
  Filled 2022-01-10: qty 100

## 2022-01-10 MED ORDER — MAGNESIUM SULFATE 2 GM/50ML IV SOLN
2.0000 g | Freq: Once | INTRAVENOUS | Status: AC
  Administered 2022-01-10: 2 g via INTRAVENOUS
  Filled 2022-01-10: qty 50

## 2022-01-10 MED ORDER — POTASSIUM CHLORIDE CRYS ER 20 MEQ PO TBCR
20.0000 meq | EXTENDED_RELEASE_TABLET | Freq: Three times a day (TID) | ORAL | Status: DC
  Administered 2022-01-10 (×2): 20 meq via ORAL
  Filled 2022-01-10 (×2): qty 1

## 2022-01-10 MED ORDER — FUROSEMIDE 40 MG PO TABS
40.0000 mg | ORAL_TABLET | Freq: Two times a day (BID) | ORAL | Status: DC
  Administered 2022-01-10 (×2): 40 mg via ORAL
  Filled 2022-01-10 (×2): qty 1

## 2022-01-10 MED ORDER — APIXABAN 5 MG PO TABS
5.0000 mg | ORAL_TABLET | Freq: Two times a day (BID) | ORAL | Status: DC
  Administered 2022-01-10: 5 mg via ORAL
  Filled 2022-01-10: qty 1

## 2022-01-10 NOTE — Progress Notes (Signed)
Physical Therapy Treatment Patient Details Name: Kristen Suarez MRN: [id number] DOB: Apr 23, 1945 Today's Date: 01/10/2022   History of Present Illness Pt is a 76 y.o. female admitted 12/31/21 after syncope during chair yoga at [facility]. Cardiac cath 10/18 showed severe 3-vessel CAD. S/p CABG 10/20. Course complicated by post-op afib. PMH includes HTN, HLD, CAD, sarcoidosis.   PT Comments    Pt progressing with mobility. Today's session focused on bed mobility and transfer training, as well as completing ADL tasks while maintaining sternal precautions; pt moving well with RW at supervision-level. HR 80s-90s with activity. Pt's husband (who is retired Therapist, sports) present and supportive. Will continue to follow acutely to address established goals.     Recommendations for follow up therapy are one component of a multi-disciplinary discharge planning process, led by the attending physician.  Recommendations may be updated based on patient status, additional functional criteria and insurance authorization.  Follow Up Recommendations  Home health PT     Assistance Recommended at Discharge Intermittent Supervision/Assistance  Patient can return home with the following A little help with walking and/or transfers;A little help with bathing/dressing/bathroom;Assistance with cooking/housework;Assist for transportation;Help with stairs or ramp for entrance   Equipment Recommendations  Rolling walker (2 wheels);BSC/3in1    Recommendations for Other Services  Mobility Specialist     Precautions / Restrictions Precautions Precautions: Sternal;Fall;Other (comment) Precaution Comments: Watch SpO2 (does not wear baseline)     Mobility  Bed Mobility Overal bed mobility: Needs Assistance Bed Mobility: Rolling, Sidelying to Sit Rolling: Supervision Sidelying to sit: Supervision       General bed mobility comments: bed flat, cues for log roll technique, supervision for safety, good ability to maintain  sternal prec    Transfers Overall transfer level: Needs assistance Equipment used: Rolling walker (2 wheels) Transfers: Sit to/from Stand Sit to Stand: Supervision           General transfer comment: cues for hand placement and sequencing with good carryover to subsequent trials, standing from EOB, BSC (over toilet) and recliner to RW with supervision    Ambulation/Gait Ambulation/Gait assistance: Supervision Gait Distance (Feet): 40 Feet Assistive device: Rolling walker (2 wheels) Gait Pattern/deviations: Step-through pattern, Decreased stride length, Trunk flexed Gait velocity: Decreased     General Gait Details: slow, guarded gait with RW and supervision for safety; further distance limited by fatigue after completing multiple ADL tasks   Stairs             Wheelchair Mobility    Modified Rankin (Stroke Patients Only)       Balance Overall balance assessment: Needs assistance Sitting-balance support: No upper extremity supported, Feet supported Sitting balance-Leahy Scale: Good     Standing balance support: Bilateral upper extremity supported, No upper extremity supported, During functional activity Standing balance-Leahy Scale: Fair Standing balance comment: can perform pericare and standing ADL tasks at sink without UE support                            Cognition Arousal/Alertness: Awake/alert Behavior During Therapy: WFL for tasks assessed/performed Overall Cognitive Status: Within Functional Limits for tasks assessed                                          Exercises      General Comments General comments (skin integrity, edema, etc.): SpO2 down to 90% on  RA at end of session, 2L O2 [place] replaced; HR 80s-90s during session. pt's husband Merchant navy officer) present and supportive      Pertinent Vitals/Pain Pain Assessment Pain Assessment: Faces Faces Pain Scale: Hurts a little bit Pain Location: sternal incision Pain  Descriptors / Indicators: Sore, Guarding Pain Intervention(s): Monitored during session    Home Living                          Prior Function            PT Goals (current goals can now be found in the care plan section) Progress towards PT goals: Progressing toward goals    Frequency    Min 3X/week      PT Plan Current plan remains appropriate    Co-evaluation              AM-PAC PT "6 Clicks" Mobility   Outcome Measure  Help needed turning from your back to your side while in a flat bed without using bedrails?: None Help needed moving from lying on your back to sitting on the side of a flat bed without using bedrails?: A Little Help needed moving to and from a bed to a chair (including a wheelchair)?: A Little Help needed standing up from a chair using your arms (e.g., wheelchair or bedside chair)?: A Little Help needed to walk in hospital room?: A Little Help needed climbing 3-5 steps with a railing? : A Little 6 Click Score: 19    End of Session Equipment Utilized During Treatment: Gait belt Activity Tolerance: Patient tolerated treatment well Patient left: in chair;with call bell/phone within reach Nurse Communication: Mobility status PT Visit Diagnosis: Other abnormalities of gait and mobility (R26.89);Difficulty in walking, not elsewhere classified (R26.2)     Time: 1219-7588 PT Time Calculation (min) (ACUTE ONLY): 34 min  Charges:  $Therapeutic Activity: 23-37 mins                     Mabeline Caras, PT, DPT Acute Rehabilitation Services  Personal: [facility] Office: [place] 01/10/2022, 1:34 PM

## 2022-01-10 NOTE — Progress Notes (Signed)
      [address]       ,[place] [address]             [phone]      Discussed anticoagulation for recurring atrial fibrillation with Dr. Tenny Craw.  Patient has a h/o multiple syncopal episodes over several years resulting in falls. For this reason, full anticoagulation is not felt to be appropriate.  She has a left atrial clip placed at the time of CABG that should minimize risk of cardio-embolic events associated with atrial arrhythmias.   Macarthur Critchley, PA-C

## 2022-01-10 NOTE — Progress Notes (Signed)
CARDIAC REHAB PHASE I     Pt resting in bed feeling tired and having incisional pain. Walked twice today and tolerated well per pt. Not ready for another walk yet. Encouraged to get in another walk today and to get oob after rest and decreased pain. Encouraged IS use. Pt IS has at bedside and states I'm using it. Will continue to follow.   [phone]  Vanessa Barbara, RN BSN 01/10/2022 2:18 PM

## 2022-01-10 NOTE — Plan of Care (Signed)
Nutrition Education Note  RD consulted for nutrition education regarding a Heart Healthy diet.   Lipid Panel     Component Value Date/Time   CHOL 141 01/03/2022 0320   CHOL 209 (H) 11/21/2020 0918   TRIG 82 01/03/2022 0320   HDL 49 01/03/2022 0320   HDL 50 11/21/2020 0918   CHOLHDL 2.9 01/03/2022 0320   VLDL 16 01/03/2022 0320   LDLCALC 76 01/03/2022 0320   LDLCALC 128 (H) 11/21/2020 0918   Pt reports that she is trying to follow a heart healthy diet at home. Pt states that she typically eats things like oatmeal, toast, yogurt, tea, coffee, chicken, fish, smoothies, apples, cheese and peanut butter.   RD provided "Heart Healthy Nutrition Therapy" handout from the Academy of Nutrition and Dietetics. Reviewed patient's dietary recall. Provided examples on ways to decrease sodium and fat intake in diet. Discouraged intake of processed foods and use of salt shaker. Encouraged fresh fruits and vegetables as well as whole grain sources of carbohydrates to maximize fiber intake. Teach back method used.  Expect good compliance.  Body mass index is 27.23 kg/m. Pt meets criteria for Overweight based on current BMI.  Current diet order is Heart Healthy/Carb Mod, patient is consuming approximately 75-100% of meals at this time. Labs and medications reviewed. No further nutrition interventions warranted at this time. RD contact information provided. If additional nutrition issues arise, please re-consult RD.  Thalia Bloodgood, RD, LDN, CNSC

## 2022-01-10 NOTE — TOC Progression Note (Signed)
Transition of Care (TOC) - Progression Note  Marvetta Gibbons RN, BSN Transitions of Care Unit 4E- RN Case Manager See Treatment Team for direct phone #    Patient Details  Name: Kristen Suarez MRN: [id number] Date of Birth: [birth year]  Transition of Care [facility]) CM/SW Contact  Dahlia Client Romeo Rabon, RN Phone Number: 01/10/2022, 3:57 PM  Clinical Narrative:    Pt s/p CABG with post op afib. Orders placed for [facility] and DME needs.  CM in to speak with pt and spouse at the bedside.  Discussed DME needs- pt confirmed she needs both RW and BSC- discussed insurance coverage for RW and possible non-coverage of BSC- pt has 3 Optician, dispensing. Choice offered for DME provider- pt and spouse- agreeable to use In house provider- [facility]. Will send referral through [employer] [facility] to process under all 3 insurance policies.   Also discussed HHPT recommendations- list provided for [facility] choice Per CMS guidelines from [email or web address] website with star ratings (copy placed in shadow chart) , Pt and spouse would like time to thing about HH. CM will f/u tomorrow to see if pt would like [facility] referral.   DME pending delivery once processed through [employer] system w/ [facility].    Expected Discharge Plan: [place] Barriers to Discharge: Continued Medical Work up  Expected Discharge Plan and Services Expected Discharge Plan: [place]   Discharge Planning Services: CM Consult Post Acute Care Choice: Durable Medical Equipment, Home Health Living arrangements for the past 2 months: Single Family Home                 DME Arranged: Bedside commode, Walker rolling DME Agency: [facility] Date DME Agency Contacted: 01/10/22 Time DME Agency Contacted: 52 Representative spoke with at DME Agency: [employer] [facility] HH Arranged: PT           Social Determinants of Health ([place]) Interventions    Readmission Risk Interventions     No data to display

## 2022-01-10 NOTE — Progress Notes (Signed)
Mobility Specialist Progress Note:   01/10/22 1155  Mobility  Activity Ambulated with assistance in hallway  Level of Assistance Minimal assist, patient does 75% or more  Assistive Device Front wheel walker  Distance Ambulated (ft) 100 ft  Activity Response Tolerated well  $Mobility charge 1 Mobility   Pt in chair willing to participate in mobility. Complaints of "Left side catching". MinA to stand then contact guard throughout ambulation. Left in bed with call bell in reach and all needs met.    [facility] Surveyor, mining Chat only

## 2022-01-10 NOTE — Progress Notes (Signed)
TCTS DAILY ICU PROGRESS NOTE                   [address]            [place] [address]          [phone]   6 Days Post-Op Procedure(s) (LRB): CORONARY ARTERY BYPASS GRAFTING (CABG) x  USING ENDOSCOPIC GREATER SAPHENOUS VEIN HARVEST (N/A) TRANSESOPHAGEAL ECHOCARDIOGRAM (TEE) (N/A) CLIPPING OF ATRIAL APPENDAGE USING SIZE 40 ATRICURE ATRIAL CLIP (N/A)  Total Length of Stay:  LOS: 7 days   Subjective: No further dizziness but says she is unable to hear in her right ear.  Walked in the hall short distances yesterday.   Went back in AF with VR 140's this morning after being in stable SR the previous 24 hours.   Objective: Vital signs in last 24 hours: Temp:  [98.3 F (36.8 C)-99.3 F (37.4 C)] 98.7 F (37.1 C) (10/26 0743) Pulse Rate:  [82-134] 134 (10/26 0743) Cardiac Rhythm: Atrial fibrillation (10/26 0734) Resp:  [16-20] 20 (10/26 0743) BP: (122-174)/(62-80) 136/75 (10/26 0743) SpO2:  [90 %-100 %] 100 % (10/26 0743) Weight:  [67.5 kg] 67.5 kg (10/26 0507)  Filed Weights   01/08/22 0500 01/09/22 0530 01/10/22 0507  Weight: 66.8 kg 68.3 kg 67.5 kg    Weight change: -0.726 kg     Intake/Output from previous day: 10/25 0701 - 10/26 0700 In: 360 [P.O.:360] Out: 2210 [Urine:2200; Drains:10]  Intake/Output this shift: No intake/output data recorded.  Current Meds: Scheduled Meds:  amiodarone  200 mg Oral BID   aspirin EC  325 mg Oral Daily   Chlorhexidine Gluconate Cloth  6 each Topical Daily   fluticasone  2 spray Each Nare Daily   insulin aspart  0-24 Units Subcutaneous TID AC & HS   melatonin  3 mg Oral QHS   metoprolol tartrate  12.5 mg Oral BID   rosuvastatin  40 mg Oral Daily   scopolamine  1 patch Transdermal Q72H   sodium chloride flush  3 mL Intravenous Q12H   Continuous Infusions:  sodium chloride     amiodarone     magnesium sulfate bolus IVPB     PRN Meds:.sodium chloride, albuterol, ondansetron **OR** ondansetron (ZOFRAN)  IV, oxyCODONE, prochlorperazine, sodium chloride flush, traMADol  General appearance: alert, cooperative, and no distress Neurologic: intact Heart: back in AF with RVR this morning Lungs: clear, shallow Abdomen: soft, NT, few bowel sounds Extremities: mild peripheral edema. The RLE EVH incision is well approximated and dry.  Wound: the sternal incision is intact and dry  Lab Results: CBC: Recent Labs    01/09/22 0350 01/10/22 0116  WBC 9.4 8.7  HGB 8.8* 8.2*  HCT 27.4* 25.1*  PLT 258 250    BMET:  Recent Labs    01/08/22 0530 01/09/22 0350  NA 137 137  K 3.2* 3.8  CL 97* 98  CO2 28 32  GLUCOSE 97 105*  BUN 15 15  CREATININE 0.99 1.08*  CALCIUM 8.4* 8.6*     CMET: Lab Results  Component Value Date   WBC 8.7 01/10/2022   HGB 8.2 (L) 01/10/2022   HCT 25.1 (L) 01/10/2022   PLT 250 01/10/2022   GLUCOSE 105 (H) 01/09/2022   CHOL 141 01/03/2022   TRIG 82 01/03/2022   HDL 49 01/03/2022   LDLCALC 76 01/03/2022   ALT 16 12/31/2021   AST 21 12/31/2021   NA 137 01/09/2022   K 3.8 01/09/2022  CL 98 01/09/2022   CREATININE 1.08 (H) 01/09/2022   BUN 15 01/09/2022   CO2 32 01/09/2022   TSH 2.32 02/29/2020   INR 1.6 (H) 01/04/2022      PT/INR:  No results for input(s): "LABPROT", "INR" in the last 72 hours.  Radiology: No results found.   Assessment/Plan: S/P Procedure(s) (LRB): CORONARY ARTERY BYPASS GRAFTING (CABG) x  USING ENDOSCOPIC GREATER SAPHENOUS VEIN HARVEST (N/A) TRANSESOPHAGEAL ECHOCARDIOGRAM (TEE) (N/A) CLIPPING OF ATRIAL APPENDAGE USING SIZE 40 ATRICURE ATRIAL CLIP (N/A)  -POD6 CABG and closure of left atrial appendage. On ASA, low-dose metoprolol, and rosuvastatin. Progressing slowly with ambulation.   -Post-op atrial fibrillation- back in A-fib after being in SR for over 24 hours. On amiodarone '200mg'$  BID. Will boost loading with another '150mg'$  IV bolus. Correct Mg++ and check K+.  Will need to anticoagulate since she has had several recurring  episodes.       -PULM- adequate oxygenation on RA. CXR today still showing some pulmonary edema and chronic interstitial fibrotic changes. Wt still several Kg+. Needs more diuresis.    -GI- tolerating PO's appetite fair,  passing gas but no BM yet. Lactulose today.  -Renal- normal function at baseline.   -HEME- expected acute blood loss anemia- stable and tolerating well. Monitor.   -DVT PPX- using SCD's, ambulate as tolerated.   -Disposition- Plan eventual discharge to home. Has good family support.  PT recommends HHPT, BSC, and walker.    Antony Odea, PA-C [phone] 01/10/2022 7:46 AM

## 2022-01-11 ENCOUNTER — Inpatient Hospital Stay (HOSPITAL_COMMUNITY): Payer: Medicare Other

## 2022-01-11 LAB — CBC WITH DIFFERENTIAL/PLATELET
Abs Immature Granulocytes: 0.15 10*3/uL — ABNORMAL HIGH (ref 0.00–0.07)
Basophils Absolute: 0.1 10*3/uL (ref 0.0–0.1)
Basophils Relative: 1 %
Eosinophils Absolute: 0.3 10*3/uL (ref 0.0–0.5)
Eosinophils Relative: 3 %
HCT: 26.6 % — ABNORMAL LOW (ref 36.0–46.0)
Hemoglobin: 8.6 g/dL — ABNORMAL LOW (ref 12.0–15.0)
Immature Granulocytes: 2 %
Lymphocytes Relative: 20 %
Lymphs Abs: 1.9 10*3/uL (ref 0.7–4.0)
MCH: 29.9 pg (ref 26.0–34.0)
MCHC: 32.3 g/dL (ref 30.0–36.0)
MCV: 92.4 fL (ref 80.0–100.0)
Monocytes Absolute: 1.1 10*3/uL — ABNORMAL HIGH (ref 0.1–1.0)
Monocytes Relative: 12 %
Neutro Abs: 5.7 10*3/uL (ref 1.7–7.7)
Neutrophils Relative %: 62 %
Platelets: 336 10*3/uL (ref 150–400)
RBC: 2.88 MIL/uL — ABNORMAL LOW (ref 3.87–5.11)
RDW: 13.9 % (ref 11.5–15.5)
WBC: 9.1 10*3/uL (ref 4.0–10.5)
nRBC: 0 % (ref 0.0–0.2)

## 2022-01-11 LAB — BASIC METABOLIC PANEL
Anion gap: 11 (ref 5–15)
BUN: 12 mg/dL (ref 8–23)
CO2: 35 mmol/L — ABNORMAL HIGH (ref 22–32)
Calcium: 8.5 mg/dL — ABNORMAL LOW (ref 8.9–10.3)
Chloride: 91 mmol/L — ABNORMAL LOW (ref 98–111)
Creatinine, Ser: 1.16 mg/dL — ABNORMAL HIGH (ref 0.44–1.00)
GFR, Estimated: 49 mL/min — ABNORMAL LOW (ref >60)
Glucose, Bld: 109 mg/dL — ABNORMAL HIGH (ref 70–99)
Potassium: 3.3 mmol/L — ABNORMAL LOW (ref 3.5–5.1)
Sodium: 137 mmol/L (ref 135–145)

## 2022-01-11 LAB — GLUCOSE, CAPILLARY
Glucose-Capillary: 111 mg/dL — ABNORMAL HIGH (ref 70–99)
Glucose-Capillary: 111 mg/dL — ABNORMAL HIGH (ref 70–99)
Glucose-Capillary: 119 mg/dL — ABNORMAL HIGH (ref 70–99)
Glucose-Capillary: 147 mg/dL — ABNORMAL HIGH (ref 70–99)

## 2022-01-11 LAB — MAGNESIUM: Magnesium: 2.2 mg/dL (ref 1.7–2.4)

## 2022-01-11 MED ORDER — POTASSIUM CHLORIDE CRYS ER 20 MEQ PO TBCR
40.0000 meq | EXTENDED_RELEASE_TABLET | Freq: Once | ORAL | Status: AC
  Administered 2022-01-11: 40 meq via ORAL
  Filled 2022-01-11: qty 2

## 2022-01-11 MED ORDER — SORBITOL 70 % SOLN
30.0000 mL | Freq: Once | Status: AC
  Administered 2022-01-11: 30 mL via ORAL
  Filled 2022-01-11: qty 30

## 2022-01-11 MED ORDER — FUROSEMIDE 40 MG PO TABS
40.0000 mg | ORAL_TABLET | Freq: Every day | ORAL | Status: DC
  Administered 2022-01-12 – 2022-01-13 (×2): 40 mg via ORAL
  Filled 2022-01-11 (×2): qty 1

## 2022-01-11 MED ORDER — POTASSIUM CHLORIDE CRYS ER 20 MEQ PO TBCR
20.0000 meq | EXTENDED_RELEASE_TABLET | ORAL | Status: AC
  Administered 2022-01-11 (×2): 20 meq via ORAL
  Filled 2022-01-11 (×2): qty 1

## 2022-01-11 NOTE — Progress Notes (Signed)
The patient ambulated in the hallway from her bedroom to the nurse station and back with a walker.  Her heart rate stayed around the 90s. When she got back in bed, her O2 sats were in the 80s and remained there.  The nurse placed her on 2 L O2 and her saturation went up to 100%.  Will continue to monitor.  Lupita Dawn, RN

## 2022-01-11 NOTE — Progress Notes (Signed)
Mobility Specialist Progress Note:   01/11/22 0943  Mobility  Activity Ambulated with assistance in hallway  Level of Assistance Contact guard assist, steadying assist  Assistive Device Front wheel walker  Distance Ambulated (ft) 100 ft  Activity Response Tolerated well  $Mobility charge 1 Mobility   Pt received in bed willing to participate in mobility. No complaints of pain. Left in chair with call bell in reach and all needs met.   [facility] Surveyor, mining Chat only

## 2022-01-11 NOTE — Progress Notes (Signed)
TCTS DAILY ICU PROGRESS NOTE                   [address]            [place] [address]          [phone]   7 Days Post-Op Procedure(s) (LRB): CORONARY ARTERY BYPASS GRAFTING (CABG) x  USING ENDOSCOPIC GREATER SAPHENOUS VEIN HARVEST (N/A) TRANSESOPHAGEAL ECHOCARDIOGRAM (TEE) (N/A) CLIPPING OF ATRIAL APPENDAGE USING SIZE 40 ATRICURE ATRIAL CLIP (N/A)  Total Length of Stay:  LOS: 8 days   Subjective: Feeling better overall. Progressing with mobility. Appetite improving.  No further atrial fibrillation overnight.   5L diuresis past 24 hours.  Still no MB despite multiple laxatives.   Objective: Vital signs in last 24 hours: Temp:  [97.5 F (36.4 C)-98.6 F (37 C)] 97.9 F (36.6 C) (10/27 0326) Pulse Rate:  [79-95] 79 (10/27 0326) Cardiac Rhythm: Normal sinus rhythm (10/26 1900) Resp:  [18-20] 18 (10/27 0326) BP: (136-148)/(69-75) 136/71 (10/27 0326) SpO2:  [97 %-100 %] 100 % (10/27 0326) Weight:  [65.7 kg] 65.7 kg (10/27 0533)  Filed Weights   01/09/22 0530 01/10/22 0507 01/11/22 0533  Weight: 68.3 kg 67.5 kg 65.7 kg    Weight change: -1.84 kg     Intake/Output from previous day: 10/26 0701 - 10/27 0700 In: 480 [P.O.:480] Out: 5120 [Urine:5100; Drains:20]  Intake/Output this shift: No intake/output data recorded.  Current Meds: Scheduled Meds:  amiodarone  200 mg Oral BID   aspirin EC  325 mg Oral Daily   Chlorhexidine Gluconate Cloth  6 each Topical Daily   fluticasone  2 spray Each Nare Daily   [START ON 01/12/2022] furosemide  40 mg Oral Daily   insulin aspart  0-24 Units Subcutaneous TID AC & HS   lactulose  20 g Oral Once   melatonin  3 mg Oral QHS   metoprolol tartrate  12.5 mg Oral BID   potassium chloride  20 mEq Oral Q4H   potassium chloride  40 mEq Oral Once   rosuvastatin  40 mg Oral Daily   scopolamine  1 patch Transdermal Q72H   sodium chloride flush  3 mL Intravenous Q12H   sorbitol  30 mL Oral Once   Continuous  Infusions:  sodium chloride     PRN Meds:.sodium chloride, albuterol, ondansetron **OR** ondansetron (ZOFRAN) IV, oxyCODONE, prochlorperazine, sodium chloride flush, traMADol  General appearance: alert, cooperative, and no distress Neurologic: intact Heart: SR past 24 hours.  Lungs: clear, shallow Abdomen: soft, NT, few bowel sounds Extremities: mild peripheral edema. The RLE EVH incision is well approximated and dry.  Wound: the sternal incision is intact and dry  Lab Results: CBC: Recent Labs    01/10/22 0116 01/11/22 0209  WBC 8.7 9.1  HGB 8.2* 8.6*  HCT 25.1* 26.6*  PLT 250 336    BMET:  Recent Labs    01/10/22 0116 01/11/22 0209  NA 137 137  K 3.8 3.3*  CL 96* 91*  CO2 30 35*  GLUCOSE 111* 109*  BUN 14 12  CREATININE 1.05* 1.16*  CALCIUM 8.5* 8.5*     CMET: Lab Results  Component Value Date   WBC 9.1 01/11/2022   HGB 8.6 (L) 01/11/2022   HCT 26.6 (L) 01/11/2022   PLT 336 01/11/2022   GLUCOSE 109 (H) 01/11/2022   CHOL 141 01/03/2022   TRIG 82 01/03/2022   HDL 49 01/03/2022   LDLCALC 76 01/03/2022   ALT 16 12/31/2021  AST 21 12/31/2021   NA 137 01/11/2022   K 3.3 (L) 01/11/2022   CL 91 (L) 01/11/2022   CREATININE 1.16 (H) 01/11/2022   BUN 12 01/11/2022   CO2 35 (H) 01/11/2022   TSH 2.32 02/29/2020   INR 1.6 (H) 01/04/2022      PT/INR:  No results for input(s): "LABPROT", "INR" in the last 72 hours.  Radiology: No results found.   Assessment/Plan: S/P Procedure(s) (LRB): CORONARY ARTERY BYPASS GRAFTING (CABG) x  USING ENDOSCOPIC GREATER SAPHENOUS VEIN HARVEST (N/A) TRANSESOPHAGEAL ECHOCARDIOGRAM (TEE) (N/A) CLIPPING OF ATRIAL APPENDAGE USING SIZE 40 ATRICURE ATRIAL CLIP (N/A)  -POD7 CABG and closure of left atrial appendage. On ASA, low-dose metoprolol, and rosuvastatin. Progressing slowly with ambulation.   -Post-op atrial fibrillation SR for past 24 hours. On amiodarone '200mg'$  BID. Avoiding anticoagulation due to h/o falls associated  with recurring syncope.      -PULM- adequate oxygenation on RA.good response to diuretic past 24 hours.   -GI- tolerating PO's appetite fair,  passing gas but no BM yet. Sorbitol today.  -Renal- normal function at baseline. Hypokalemia, will supplement.  -HEME- expected acute blood loss anemia- stable and tolerating well. Monitor.   -DVT PPX- using SCD's, ambulate as tolerated.   -Disposition- Plan eventual discharge to home tomorrow. Has good family support.  PT recommends HHPT, BSC, and walker.    Kristen Odea, PA-C [phone] 01/11/2022 8:07 AM

## 2022-01-11 NOTE — TOC Progression Note (Signed)
Transition of Care (TOC) - Progression Note  Valentina Gu, BSN Transitions of Care Unit 4E- RN Case Manager See Treatment Team for direct phone #    Patient Details  Name: Kristen Suarez MRN: [id number] Date of Birth: October 05, 1945  Transition of Care [facility]) CM/SW Contact  Dahlia Client Romeo Rabon, RN Phone Number: 01/11/2022, 12:59 PM  Clinical Narrative:    Follow up done with pt and spouse at bedside for [facility] choice- they have selected [facility] as first choice and [facility] as backup.   DME has been delivered to room by [employer].   Call made to [facility] with  [facility] for HHPT referral- referral has been accepted.    Expected Discharge Plan: [place] Barriers to Discharge: Continued Medical Work up  Expected Discharge Plan and Services Expected Discharge Plan: [place]   Discharge Planning Services: CM Consult Post Acute Care Choice: Durable Medical Equipment, Home Health Living arrangements for the past 2 months: Single Family Home                 DME Arranged: Bedside commode, Walker rolling DME Agency: [employer] Date DME Agency Contacted: 01/10/22 Time DME Agency Contacted: 82 Representative spoke with at DME Agency: [place]: PT [place]: [place] Date [place]: 01/11/22 Time [place]: 1259 Representative spoke with at [place]: [place] ([place]) Interventions    Readmission Risk Interventions     No data to display

## 2022-01-11 NOTE — Progress Notes (Signed)
CARDIAC REHAB PHASE I    Pt resting in bed. Walked x1 today and sat up in chair most of the day. Feeling tired now with some incisional pain. Pt asking RN for pain medication. Home OHS education including site care, restrictions, heart healthy diet, home needs, exercise guidelines, risk factors, IS use at home and CRP2 reviewed with pt and husband. All questions and concerns addressed. Will refer to [facility] for CRP2. Will continue to follow.   [phone]  Vanessa Barbara, RN BSN 01/11/2022 2:23 PM

## 2022-01-12 LAB — BASIC METABOLIC PANEL
Anion gap: 8 (ref 5–15)
BUN: 16 mg/dL (ref 8–23)
CO2: 34 mmol/L — ABNORMAL HIGH (ref 22–32)
Calcium: 8.6 mg/dL — ABNORMAL LOW (ref 8.9–10.3)
Chloride: 93 mmol/L — ABNORMAL LOW (ref 98–111)
Creatinine, Ser: 1.2 mg/dL — ABNORMAL HIGH (ref 0.44–1.00)
GFR, Estimated: 47 mL/min — ABNORMAL LOW (ref >60)
Glucose, Bld: 95 mg/dL (ref 70–99)
Potassium: 4.7 mmol/L (ref 3.5–5.1)
Sodium: 135 mmol/L (ref 135–145)

## 2022-01-12 LAB — CBC WITH DIFFERENTIAL/PLATELET
Abs Immature Granulocytes: 0.17 10*3/uL — ABNORMAL HIGH (ref 0.00–0.07)
Basophils Absolute: 0.1 10*3/uL (ref 0.0–0.1)
Basophils Relative: 1 %
Eosinophils Absolute: 0.3 10*3/uL (ref 0.0–0.5)
Eosinophils Relative: 3 %
HCT: 25.4 % — ABNORMAL LOW (ref 36.0–46.0)
Hemoglobin: 8.3 g/dL — ABNORMAL LOW (ref 12.0–15.0)
Immature Granulocytes: 2 %
Lymphocytes Relative: 15 %
Lymphs Abs: 1.4 10*3/uL (ref 0.7–4.0)
MCH: 30.1 pg (ref 26.0–34.0)
MCHC: 32.7 g/dL (ref 30.0–36.0)
MCV: 92 fL (ref 80.0–100.0)
Monocytes Absolute: 1 10*3/uL (ref 0.1–1.0)
Monocytes Relative: 11 %
Neutro Abs: 6.5 10*3/uL (ref 1.7–7.7)
Neutrophils Relative %: 68 %
Platelets: 343 10*3/uL (ref 150–400)
RBC: 2.76 MIL/uL — ABNORMAL LOW (ref 3.87–5.11)
RDW: 14.3 % (ref 11.5–15.5)
WBC: 9.4 10*3/uL (ref 4.0–10.5)
nRBC: 0 % (ref 0.0–0.2)

## 2022-01-12 LAB — MAGNESIUM: Magnesium: 2.1 mg/dL (ref 1.7–2.4)

## 2022-01-12 LAB — GLUCOSE, CAPILLARY
Glucose-Capillary: 96 mg/dL (ref 70–99)
Glucose-Capillary: 99 mg/dL (ref 70–99)

## 2022-01-12 MED ORDER — POLYETHYLENE GLYCOL 3350 17 G PO PACK: 17.0000 g | PACK | Freq: Every day | ORAL | Status: DC

## 2022-01-12 MED ORDER — POLYETHYLENE GLYCOL 3350 17 G PO PACK
17.0000 g | PACK | Freq: Every day | ORAL | Status: DC
  Administered 2022-01-12: 17 g via ORAL
  Filled 2022-01-12 (×2): qty 1

## 2022-01-12 MED ORDER — PHENOL 1.4 % MT LIQD
1.0000 | OROMUCOSAL | Status: DC | PRN
  Administered 2022-01-12: 1 via OROMUCOSAL
  Filled 2022-01-12: qty 177

## 2022-01-12 MED ORDER — FLEET ENEMA 7-19 GM/118ML RE ENEM
1.0000 | ENEMA | Freq: Once | RECTAL | Status: AC
  Administered 2022-01-12: 1 via RECTAL
  Filled 2022-01-12: qty 1

## 2022-01-12 NOTE — Plan of Care (Signed)
  Problem: Education: Goal: Knowledge of General Education information will improve Description: Including pain rating scale, medication(s)/side effects and non-pharmacologic comfort measures Outcome: Progressing   Problem: Health Behavior/Discharge Planning: Goal: Ability to manage health-related needs will improve Outcome: Progressing   Problem: Clinical Measurements: Goal: Ability to maintain clinical measurements within normal limits will improve Outcome: Progressing Goal: Will remain free from infection Outcome: Progressing Goal: Diagnostic test results will improve Outcome: Progressing Goal: Respiratory complications will improve Outcome: Progressing Goal: Cardiovascular complication will be avoided Outcome: Progressing   Problem: Nutrition: Goal: Adequate nutrition will be maintained Outcome: Progressing   Problem: Coping: Goal: Level of anxiety will decrease Outcome: Progressing   Problem: Skin Integrity: Goal: Risk for impaired skin integrity will decrease Outcome: Progressing   Problem: Cardiac: Goal: Will achieve and/or maintain hemodynamic stability Outcome: Progressing

## 2022-01-12 NOTE — Progress Notes (Signed)
CARDIAC REHAB PHASE I   PRE:  Rate/Rhythm: 82 NSR  BP:  Sitting: 95 RA      SaO2: 116/68  MODE:  Ambulation: 85 ft   POST:  Rate/Rhythm: 98 NSR  BP:  Sitting: 126/60      SaO2: 97 RA  Pt was ambulated with moderate assistance using gait belt and RW. During ambulation pt stumbled due to dizziness and a took a standing rest break before continuing back to room. Pt reported ear pain.   [name]  1:35 PM 01/12/2022

## 2022-01-12 NOTE — Progress Notes (Addendum)
      [name]       [name]             [phone]        8 Days Post-Op Procedure(s) (LRB): CORONARY ARTERY BYPASS GRAFTING (CABG) x  USING ENDOSCOPIC GREATER SAPHENOUS VEIN HARVEST (N/A) TRANSESOPHAGEAL ECHOCARDIOGRAM (TEE) (N/A) CLIPPING OF ATRIAL APPENDAGE USING SIZE 40 ATRICURE ATRIAL CLIP (N/A)  Subjective: Patient has had decreased hearing right ear and now this am starting to decrease on the right, sore throat as well. She has not had a bowel movement yet.  Objective: Vital signs in last 24 hours: Temp:  [97.7 F (36.5 C)-98.9 F (37.2 C)] 97.8 F (36.6 C) (10/28 0341) Pulse Rate:  [80-102] 94 (10/28 0341) Cardiac Rhythm: Normal sinus rhythm (10/27 1936) Resp:  [13-24] 17 (10/28 0341) BP: (130-165)/(67-74) 151/67 (10/28 0341) SpO2:  [92 %-100 %] 100 % (10/27 2326) FiO2 (%):  [21 %] 21 % (10/27 1610) Weight:  [65.7 kg] 65.7 kg (10/28 0341)  Pre op weight 65.5 kg Current Weight  01/12/22 65.7 kg      Intake/Output from previous day: 10/27 0701 - 10/28 0700 In: 327.3 [P.O.:240; I.V.:87.3] Out: 965 [Urine:950; Drains:15]   Physical Exam:  Cardiovascular: RRR Pulmonary: Clear to auscultation bilaterally Abdomen: Soft, non tender, bowel sounds present. Extremities: Trace bilateral lower extremity edema. Wounds: Clean and dry.  No erythema or signs of infection.  Lab Results: CBC: Recent Labs    01/11/22 0209 01/12/22 0059  WBC 9.1 9.4  HGB 8.6* 8.3*  HCT 26.6* 25.4*  PLT 336 343   BMET:  Recent Labs    01/11/22 0209 01/12/22 0059  NA 137 135  K 3.3* 4.7  CL 91* 93*  CO2 35* 34*  GLUCOSE 109* 95  BUN 12 16  CREATININE 1.16* 1.20*  CALCIUM 8.5* 8.6*    PT/INR:  Lab Results  Component Value Date   INR 1.6 (H) 01/04/2022   INR 1.0 11/15/2021   ABG:  INR: Will add last result for INR, ABG once components are confirmed Will add last 4 CBG results once components are confirmed  Assessment/Plan:  1.  CV - Previous a fib. Maintaining SR. On Amiodarone 200 mg bid and Lopressor 12.5 mg bid. Avoid anticoagulation secondary to history of syncope/falls 2.  Pulmonary - On room air. Encourage incentive spirometer. 3.  Expected post op acute blood loss anemia - H and H this am slightly decreased to 8.3 and 25.4 4. CBGs 119/147/96. Patient with no history of diabetes. Stop accu checks and SS PRN 5. Mild volume overload-Creatinine this am mildly elevated at 1.2. Will likely not require diuresis at discharge 6. Remove EPW 7. Constipation-Given Sorbitol, Lactulose. Try Miralax;may need enema 8. Regarding hearing changes/loss-PA apparently used otoscope yesterday (per patient). ? etiology 8. Patient wishes to be discharged in am as of 11:30 am, EPW removed but she had not had a bowel movement yet  Kristen M ZimmermanPA-C 8:16 AM   I have personally reviewed the above and agree

## 2022-01-12 NOTE — Progress Notes (Signed)
Patient placed on 2L O2 to maintain >92%. Patient sats prior to placing O2 88%. Patient encouraged to use IS. Will continue to monitor

## 2022-01-12 NOTE — Progress Notes (Signed)
Mobility Specialist - Progress Note   01/12/22 0915  Mobility  Activity Ambulated with assistance in room  Level of Assistance Contact guard assist, steadying assist  Assistive Device Front wheel walker  Distance Ambulated (ft) 20 ft  Activity Response Tolerated well  Mobility Referral Yes  $Mobility charge 1 Mobility   Pt was received in room and requesting help to get back to bed. Pt declined ambulation in the hall d/t fatigue from going to bathroom.Pt was returned to bed with all needs met.   Larey Seat

## 2022-01-12 NOTE — Plan of Care (Signed)
  Problem: Activity: Goal: Risk for activity intolerance will decrease Outcome: Progressing   

## 2022-01-12 NOTE — Progress Notes (Signed)
Mobility Specialist - Progress Note   01/12/22 1546  Mobility  Activity Ambulated with assistance in hallway  Level of Assistance Contact guard assist, steadying assist  Assistive Device Front wheel walker  Distance Ambulated (ft) 150 ft  RUE Weight Bearing PWB  LUE Weight Bearing PWB  Activity Response Tolerated well  Mobility Referral Yes  $Mobility charge 1 Mobility   Pt received in bed and agreeable. Pt c/o slight SOB during ambulation. Pt had slight LOB but was able to self correct. Pt was returned to bed with all needs met.  Larey Seat

## 2022-01-12 NOTE — Progress Notes (Signed)
Wires removed per order, vitals take, patient denies pain.

## 2022-01-13 LAB — CBC WITH DIFFERENTIAL/PLATELET
Abs Immature Granulocytes: 0.22 10*3/uL — ABNORMAL HIGH (ref 0.00–0.07)
Basophils Absolute: 0.1 10*3/uL (ref 0.0–0.1)
Basophils Relative: 1 %
Eosinophils Absolute: 0.3 10*3/uL (ref 0.0–0.5)
Eosinophils Relative: 2 %
HCT: 29.3 % — ABNORMAL LOW (ref 36.0–46.0)
Hemoglobin: 9.4 g/dL — ABNORMAL LOW (ref 12.0–15.0)
Immature Granulocytes: 1 %
Lymphocytes Relative: 14 %
Lymphs Abs: 2.1 10*3/uL (ref 0.7–4.0)
MCH: 29.9 pg (ref 26.0–34.0)
MCHC: 32.1 g/dL (ref 30.0–36.0)
MCV: 93.3 fL (ref 80.0–100.0)
Monocytes Absolute: 1.4 10*3/uL — ABNORMAL HIGH (ref 0.1–1.0)
Monocytes Relative: 9 %
Neutro Abs: 11.2 10*3/uL — ABNORMAL HIGH (ref 1.7–7.7)
Neutrophils Relative %: 73 %
Platelets: 523 10*3/uL — ABNORMAL HIGH (ref 150–400)
RBC: 3.14 MIL/uL — ABNORMAL LOW (ref 3.87–5.11)
RDW: 14.6 % (ref 11.5–15.5)
WBC: 15.3 10*3/uL — ABNORMAL HIGH (ref 4.0–10.5)
nRBC: 0.1 % (ref 0.0–0.2)

## 2022-01-13 LAB — URINALYSIS, COMPLETE (UACMP) WITH MICROSCOPIC
Bilirubin Urine: NEGATIVE
Glucose, UA: NEGATIVE mg/dL
Hgb urine dipstick: NEGATIVE
Ketones, ur: NEGATIVE mg/dL
Nitrite: NEGATIVE
Protein, ur: NEGATIVE mg/dL
Specific Gravity, Urine: <1.005 — ABNORMAL LOW (ref 1.005–1.030)
pH: 6.5 (ref 5.0–8.0)

## 2022-01-13 LAB — MAGNESIUM: Magnesium: 2.1 mg/dL (ref 1.7–2.4)

## 2022-01-13 MED ORDER — AMLODIPINE BESYLATE 5 MG PO TABS: 5.0000 mg | ORAL_TABLET | Freq: Every day | ORAL | 1 refills | Status: DC

## 2022-01-13 MED ORDER — IRBESARTAN 150 MG PO TABS: 75.0000 mg | ORAL_TABLET | Freq: Every day | ORAL | Status: DC

## 2022-01-13 MED ORDER — TRAMADOL HCL 50 MG PO TABS: 50.0000 mg | ORAL_TABLET | Freq: Four times a day (QID) | ORAL | 0 refills | Status: AC | PRN

## 2022-01-13 MED ORDER — AMIODARONE HCL 200 MG PO TABS: 200.0000 mg | ORAL_TABLET | Freq: Two times a day (BID) | ORAL | 1 refills | Status: DC

## 2022-01-13 MED ORDER — AMLODIPINE BESYLATE 5 MG PO TABS
5.0000 mg | ORAL_TABLET | Freq: Every day | ORAL | Status: DC
  Administered 2022-01-13: 5 mg via ORAL
  Filled 2022-01-13: qty 1

## 2022-01-13 MED ORDER — FUROSEMIDE 40 MG PO TABS: 40.0000 mg | ORAL_TABLET | Freq: Every day | ORAL | 0 refills | Status: DC

## 2022-01-13 MED ORDER — ASPIRIN 325 MG PO TBEC: 325.0000 mg | DELAYED_RELEASE_TABLET | Freq: Every day | ORAL | Status: DC

## 2022-01-13 MED ORDER — METOPROLOL TARTRATE 25 MG PO TABS
25.0000 mg | ORAL_TABLET | Freq: Two times a day (BID) | ORAL | Status: DC
  Administered 2022-01-13: 25 mg via ORAL
  Filled 2022-01-13: qty 1

## 2022-01-13 MED ORDER — METOPROLOL TARTRATE 25 MG PO TABS: 25.0000 mg | ORAL_TABLET | Freq: Two times a day (BID) | ORAL | 1 refills | Status: DC

## 2022-01-13 NOTE — Progress Notes (Addendum)
      [address]       [place] [address]             [phone]        9 Days Post-Op Procedure(s) (LRB): CORONARY ARTERY BYPASS GRAFTING (CABG) x  USING ENDOSCOPIC GREATER SAPHENOUS VEIN HARVEST (N/A) TRANSESOPHAGEAL ECHOCARDIOGRAM (TEE) (N/A) CLIPPING OF ATRIAL APPENDAGE USING SIZE 40 ATRICURE ATRIAL CLIP (N/A)  Subjective: Patient with large bowel movement then loose stool. She still feels a little weak but knows she will get stronger in time. She would like to go home.  Objective: Vital signs in last 24 hours: Temp:  [98 F (36.7 C)-98.7 F (37.1 C)] 98.1 F (36.7 C) (10/29 0700) Pulse Rate:  [83-100] 89 (10/29 0700) Cardiac Rhythm: Normal sinus rhythm (10/28 2043) Resp:  [17-23] 21 (10/29 0700) BP: (122-169)/(35-82) 159/82 (10/29 0700) SpO2:  [91 %-95 %] 93 % (10/29 0700) Weight:  [64.5 kg] 64.5 kg (10/29 0400)  Pre op weight 64.5 kg Current Weight  01/13/22 64.5 kg      Intake/Output from previous day: No intake/output data recorded.   Physical Exam:  Cardiovascular: RRR Pulmonary: Clear to auscultation bilaterally Abdomen: Soft, non tender, bowel sounds present. Extremities: Trace bilateral lower extremity edema. Wounds: Clean and dry.  No erythema or signs of infection.  Lab Results: CBC: Recent Labs    01/12/22 0059 01/13/22 0116  WBC 9.4 15.3*  HGB 8.3* 9.4*  HCT 25.4* 29.3*  PLT 343 523*    BMET:  Recent Labs    01/11/22 0209 01/12/22 0059  NA 137 135  K 3.3* 4.7  CL 91* 93*  CO2 35* 34*  GLUCOSE 109* 95  BUN 12 16  CREATININE 1.16* 1.20*  CALCIUM 8.5* 8.6*     PT/INR:  Lab Results  Component Value Date   INR 1.6 (H) 01/04/2022   INR 1.0 11/15/2021   ABG:  INR: Will add last result for INR, ABG once components are confirmed Will add last 4 CBG results once components are confirmed  Assessment/Plan:  1. CV - Previous a fib. Maintaining SR. On Amiodarone 200 mg bid and Lopressor 12.5 mg bid.  Hypertensive. Restart Amlodipine and increase Lopressor to 25 mg bid. Avoid anticoagulation secondary to history of syncope/falls 2.  Pulmonary - On room air. Encourage incentive spirometer. 3.  Expected post op acute blood loss anemia - H and H this am stable at 9.4 and 29.3 4. Mild volume overload-Last creatinine mildly elevated at 1.2. Will give a few more days of Lasix then stop. 5. Regarding hearing changes/loss-PA apparently used otoscope. ? Etiology. She will need to follow up with medical doctor if does not improve 6. Leukocytosis-WBC 15,300. No fever, no wound infection. Check UA.As discussed with Dr. Lavonna Monarch, re check CBC as outpatient 7. As discussed with surgeon will discharge  Kynadie Yaun M ZimmermanPA-C 8:40 AM

## 2022-01-13 NOTE — Plan of Care (Signed)
  Problem: Education: Goal: Knowledge of General Education information will improve Description: Including pain rating scale, medication(s)/side effects and non-pharmacologic comfort measures Outcome: Adequate for Discharge   Problem: Health Behavior/Discharge Planning: Goal: Ability to manage health-related needs will improve Outcome: Adequate for Discharge   Problem: Clinical Measurements: Goal: Ability to maintain clinical measurements within normal limits will improve Outcome: Adequate for Discharge Goal: Will remain free from infection Outcome: Adequate for Discharge Goal: Diagnostic test results will improve Outcome: Adequate for Discharge Goal: Respiratory complications will improve Outcome: Adequate for Discharge Goal: Cardiovascular complication will be avoided Outcome: Adequate for Discharge   Problem: Cardiac: Goal: Will achieve and/or maintain hemodynamic stability Outcome: Adequate for Discharge   Problem: Respiratory: Goal: Respiratory status will improve Outcome: Adequate for Discharge

## 2022-01-14 ENCOUNTER — Ambulatory Visit: Payer: Medicare Other | Admitting: Physician Assistant

## 2022-01-14 ENCOUNTER — Telehealth: Payer: Self-pay | Admitting: Nurse Practitioner

## 2022-01-14 ENCOUNTER — Telehealth: Payer: Self-pay

## 2022-01-14 NOTE — Telephone Encounter (Signed)
Transition Care Management Follow-up Telephone Call Date of discharge and from where: [facility] 01/13/2022 How have you been since you were released from the hospital? weak Any questions or concerns? No  Items Reviewed: Did the pt receive and understand the discharge instructions provided? Yes  Medications obtained and verified? Yes  Other? No  Any new allergies since your discharge? No  Dietary orders reviewed? Yes Do you have support at home? Yes   Home Care and Equipment/Supplies: Were home health services ordered? yes If so, what is the name of the agency? unknown  Has the agency set up a time to come to the patient's home? no Were any new equipment or medical supplies ordered?  No What is the name of the medical supply agency? N/a Were you able to get the supplies/equipment? no Do you have any questions related to the use of the equipment or supplies? No  Functional Questionnaire: (I = Independent and D = Dependent) ADLs: D  Bathing/Dressing- D  Meal Prep- D  Eating- I  Maintaining continence- I  Transferring/Ambulation- D  Managing Meds- D  Follow up appointments reviewed:  PCP Hospital f/u appt confirmed? No  patient's husband called this morning, front staff to call them back for appt [facility] f/u appt confirmed? Yes  Scheduled to see Quillian Quince enter on 01/17/2022 @ 2:00. Are transportation arrangements needed? No  If their condition worsens, is the pt aware to call PCP or go to the Emergency Dept.? Yes Was the patient provided with contact information for the PCP's office or ED? Yes Was to pt encouraged to call back with questions or concerns? Yes  Juanda Crumble, LPN [place] Direct Dial [phone]

## 2022-01-14 NOTE — Telephone Encounter (Signed)
Pt's husband is calling in needing his wife to get a CBC test done by tomorrow. She has just been released from the hospital. I offered a hospital f/up, he declined saying she has a cardio app this week. Please advise [name] at [phone]

## 2022-01-15 ENCOUNTER — Telehealth: Payer: Self-pay

## 2022-01-15 ENCOUNTER — Telehealth: Payer: Self-pay | Admitting: Cardiovascular Disease

## 2022-01-15 DIAGNOSIS — Z48812 Encounter for surgical aftercare following surgery on the circulatory system: Secondary | ICD-10-CM | POA: Diagnosis not present

## 2022-01-15 DIAGNOSIS — Z79899 Other long term (current) drug therapy: Secondary | ICD-10-CM

## 2022-01-15 LAB — CBC
Hematocrit: 29 % — ABNORMAL LOW (ref 34.0–46.6)
Hemoglobin: 9.5 g/dL — ABNORMAL LOW (ref 11.1–15.9)
MCH: 28.9 pg (ref 26.6–33.0)
MCHC: 32.8 g/dL (ref 31.5–35.7)
MCV: 88 fL (ref 79–97)
Platelets: 637 10*3/uL — ABNORMAL HIGH (ref 150–450)
RBC: 3.29 x10E6/uL — ABNORMAL LOW (ref 3.77–5.28)
RDW: 13.1 % (ref 11.7–15.4)
WBC: 9.1 10*3/uL (ref 3.4–10.8)

## 2022-01-15 NOTE — Telephone Encounter (Signed)
Pt spouse calling because discharge paperwork from [facility] says pt needs CBC drawn.  Pt spouse would like labs done before her appt with [facility] on 01/17/22

## 2022-01-15 NOTE — Telephone Encounter (Signed)
PER DISCHARGE ORDERS REPEAT CBC ORDERED, PATIENT NOTIFIED.

## 2022-01-15 NOTE — Telephone Encounter (Signed)
Kristen Suarez, PT with [facility] contacted the office requesting VO for physical therapy. Patient is s/p CABG with Dr. Tenny Craw 10/20. VO given for 2xwk/ 2w and 1xwk/ 1wk. Will await faxed copy for physician signature.

## 2022-01-16 ENCOUNTER — Other Ambulatory Visit: Payer: Self-pay | Admitting: Cardiothoracic Surgery

## 2022-01-16 ENCOUNTER — Other Ambulatory Visit: Payer: Self-pay | Admitting: Cardiovascular Disease

## 2022-01-16 ENCOUNTER — Other Ambulatory Visit: Payer: Self-pay | Admitting: Nurse Practitioner

## 2022-01-16 ENCOUNTER — Encounter (HOSPITAL_BASED_OUTPATIENT_CLINIC_OR_DEPARTMENT_OTHER): Payer: Self-pay | Admitting: Cardiovascular Disease

## 2022-01-16 DIAGNOSIS — Z951 Presence of aortocoronary bypass graft: Secondary | ICD-10-CM

## 2022-01-16 DIAGNOSIS — R059 Cough, unspecified: Secondary | ICD-10-CM

## 2022-01-16 DIAGNOSIS — R0982 Postnasal drip: Secondary | ICD-10-CM

## 2022-01-16 NOTE — Telephone Encounter (Signed)
Please advise 

## 2022-01-16 NOTE — Telephone Encounter (Signed)
Refill request

## 2022-01-17 ENCOUNTER — Ambulatory Visit
Admission: RE | Admit: 2022-01-17 | Discharge: 2022-01-17 | Disposition: A | Discharge status: Home / Self Care | Payer: Medicare Other | Source: Ambulatory Visit | Attending: Cardiothoracic Surgery | Admitting: Cardiothoracic Surgery

## 2022-01-17 ENCOUNTER — Encounter: Payer: Self-pay | Admitting: Cardiothoracic Surgery

## 2022-01-17 ENCOUNTER — Ambulatory Visit (INDEPENDENT_AMBULATORY_CARE_PROVIDER_SITE_OTHER): Payer: Medicare Other | Admitting: Cardiothoracic Surgery

## 2022-01-17 ENCOUNTER — Other Ambulatory Visit: Payer: Self-pay

## 2022-01-17 VITALS — BP 126/74 | HR 74 | Resp 20 | Ht 62.0 in | Wt 133.2 lb

## 2022-01-17 DIAGNOSIS — Z951 Presence of aortocoronary bypass graft: Secondary | ICD-10-CM

## 2022-01-17 MED ORDER — LEVOFLOXACIN 250 MG PO TABS: 250.0000 mg | ORAL_TABLET | Freq: Every day | ORAL | 0 refills | Status: AC

## 2022-01-18 ENCOUNTER — Other Ambulatory Visit: Payer: Self-pay | Admitting: Cardiovascular Disease

## 2022-01-19 NOTE — Progress Notes (Signed)
Doing reasonably s/p CAB / LAA clip 01/04/22  Patient doing well but feels run down.  Incision c/d/I About 2-78m erythma around top 1cm of incision and also bottom 1cm of incision. No tenderness to palp  Vitals:   01/17/22 1339  BP: 126/74  Pulse: 74  Resp: 20  SpO2: 99%   XR looks excellent  Plan:  Start 5 days levaquin (many allergies, this is what patient took periop with no issues)  See back 1 week.

## 2022-01-24 ENCOUNTER — Ambulatory Visit (HOSPITAL_COMMUNITY)
Admission: RE | Admit: 2022-01-24 | Discharge: 2022-01-24 | Disposition: A | Discharge status: Home / Self Care | Payer: Medicare Other | Source: Ambulatory Visit | Attending: Cardiothoracic Surgery | Admitting: Cardiothoracic Surgery

## 2022-01-24 ENCOUNTER — Other Ambulatory Visit: Payer: Self-pay | Admitting: Cardiothoracic Surgery

## 2022-01-24 ENCOUNTER — Ambulatory Visit (INDEPENDENT_AMBULATORY_CARE_PROVIDER_SITE_OTHER): Payer: Self-pay | Admitting: Cardiothoracic Surgery

## 2022-01-24 VITALS — BP 114/71 | HR 67 | Resp 20 | Ht 62.0 in | Wt 133.0 lb

## 2022-01-24 DIAGNOSIS — Z951 Presence of aortocoronary bypass graft: Secondary | ICD-10-CM | POA: Diagnosis not present

## 2022-01-24 DIAGNOSIS — L03818 Cellulitis of other sites: Secondary | ICD-10-CM

## 2022-01-24 MED ORDER — LEVOFLOXACIN 250 MG PO TABS: 250.0000 mg | ORAL_TABLET | Freq: Every day | ORAL | 0 refills | Status: DC

## 2022-01-24 MED FILL — Sodium Chloride IV Soln 0.9%: INTRAVENOUS | Qty: 2000 | Status: CN

## 2022-01-24 MED FILL — Heparin Sodium (Porcine) Inj 1000 Unit/ML: INTRAMUSCULAR | Qty: 10 | Status: CN

## 2022-01-24 MED FILL — Sodium Bicarbonate IV Soln 8.4%: INTRAVENOUS | Qty: 50 | Status: AC

## 2022-01-24 MED FILL — Sodium Chloride IV Soln 0.9%: INTRAVENOUS | Qty: 2000 | Status: AC

## 2022-01-24 MED FILL — Heparin Sodium (Porcine) Inj 1000 Unit/ML: INTRAMUSCULAR | Qty: 10 | Status: AC

## 2022-01-24 MED FILL — Electrolyte-R (PH 7.4) Solution: INTRAVENOUS | Qty: 5000 | Status: CN

## 2022-01-24 MED FILL — Sodium Bicarbonate IV Soln 8.4%: INTRAVENOUS | Qty: 50 | Status: CN

## 2022-01-24 MED FILL — Calcium Chloride Inj 10%: INTRAVENOUS | Qty: 10 | Status: AC

## 2022-01-24 MED FILL — Electrolyte-R (PH 7.4) Solution: INTRAVENOUS | Qty: 5000 | Status: AC

## 2022-01-24 MED FILL — Calcium Chloride Inj 10%: INTRAVENOUS | Qty: 10 | Status: CN

## 2022-01-24 NOTE — Progress Notes (Signed)
Lower extremity venous duplex has been completed.   Preliminary results in CV Proc.   Jinny Blossom Zayana Salvador 01/24/2022 2:38 PM

## 2022-01-24 NOTE — Progress Notes (Signed)
Doing reasonably s/p CAB / LAA clip 01/04/22   Patient doing better than last week. Walking more .   Incision c/d/I About 2-51m erythma around top 1cm of incision  Bottom aspect of incision all the way healed.  No tenderness to palp Right leg 3+ edema Left leg no sig edema    Today's Vitals   01/24/22 1241  BP: 114/71  Pulse: 67  Resp: 20  SpO2: 98%  Weight: 133 lb (60.3 kg)  Height: '5\' 2"'$  (1.575 m)   Body mass index is 24.33 kg/m.   Plan: Bilat LE duplex venous. Looks to be related to vein harvest but helpful to r/o DVT  Start 7 days levaquin (many allergies, this is what patient took periop with no issues). Took 5 days of this with resolution of bottom part of the incision.    See back 1 week.

## 2022-01-27 NOTE — Op Note (Addendum)
OPERATIVE NOTE: Patient Name: Kristen Suarez Date of Birth: 10/02/1945 Date of Operation: 01/04/22  OPERATION: CABG x 4 (LIMA - LAD, SVG - OM, SVG - PDA, SVG - Diag) Left atrial appendage closure (68m Atriclip) Endoscopic saphenous vein harvest  SURGEON: DPierre BaliEnter MD   ASSISTANT: WJadene PieriniPA  FINDINGS: EF 65% before surgery EF 65% after surgery   Adequate conduits and targets Best quality vein of available used for PDA, OM.  Rest of vein used for Diag   SPECIMENS None   TUBES:  2 24 Fr blakes (left pleural and mediastinal) 1 JP to Bulb (mediastinal)   PROCEDURE IN DETAIL: The patient was brought in the operating room and laid in supine position.  The patient was prepped and draped in standard fashion.  An arterial, pulmonary arterial and venous lines were placed by anesthesia along with a single-lumen endotracheal tube. Vein was harvested out of the right leg endoscopically by the physician assistant. Local analgesia given to the sternum. Sternotomy was performed and the pericardium was opened. LIMA harvested from left chest wall and 5000u heparin given. Full dose heparin was given to achieve an ACT of 480 and aortic and venous cannulas were inserted.The patient was placed on cardiopulmonary bypass and using a cross-clamp and cardiac arrest was achieved with 1.2  L of modified blood [place] cardioplegia antegrade. Topical cooling was used as well on the heart.  OM sewn distally with 7-0 prolene, then proximal with 6-0 prolene. PDA sewn distally with 7-0 prolene, then proximal to the aorta with 6-0 prolene. Diag sewn distally with 7-0 prolene, then proximal to the aorta with 6-0 prolene.  Following this, a 469mAtriclip was placed on the left atrial appendage. After vein grafts complete, LIMA sewn to LAD with 7-0 prolene.     Next, the cross-clamp was released  with the root vent on.  After de-airing the heart came back into regular sinus rhythm and the heart took over the  circulation. Bypass was weaned and cannulas were removed, then protamine was given and hemostasis was achieved. Chest tubes were placed as above along with ventricular and atrial wires.  The chest was then closed in interrupted steel wire and the presternal layers were closed in 3 layers of absorbable suture.  The patient had a stable status and was transferred to the postoperative care unit All surgical counts were correct.

## 2022-01-29 ENCOUNTER — Ambulatory Visit (INDEPENDENT_AMBULATORY_CARE_PROVIDER_SITE_OTHER): Payer: Medicare Other | Admitting: Family

## 2022-01-29 ENCOUNTER — Encounter (HOSPITAL_BASED_OUTPATIENT_CLINIC_OR_DEPARTMENT_OTHER): Payer: Self-pay | Admitting: Family

## 2022-01-29 ENCOUNTER — Telehealth (HOSPITAL_BASED_OUTPATIENT_CLINIC_OR_DEPARTMENT_OTHER): Payer: Self-pay

## 2022-01-29 VITALS — BP 146/70 | HR 66 | Ht 62.0 in | Wt 135.4 lb

## 2022-01-29 DIAGNOSIS — E785 Hyperlipidemia, unspecified: Secondary | ICD-10-CM | POA: Diagnosis not present

## 2022-01-29 DIAGNOSIS — I25118 Atherosclerotic heart disease of native coronary artery with other forms of angina pectoris: Secondary | ICD-10-CM | POA: Diagnosis not present

## 2022-01-29 DIAGNOSIS — I701 Atherosclerosis of renal artery: Secondary | ICD-10-CM

## 2022-01-29 DIAGNOSIS — Z951 Presence of aortocoronary bypass graft: Secondary | ICD-10-CM | POA: Diagnosis not present

## 2022-01-29 MED ORDER — REPATHA SURECLICK 140 MG/ML ~~LOC~~ SOAJ: 140.0000 mg | SUBCUTANEOUS | 6 refills | Status: DC

## 2022-01-29 MED ORDER — AMLODIPINE BESYLATE 5 MG PO TABS: 5.0000 mg | ORAL_TABLET | Freq: Every day | ORAL | 1 refills | Status: DC

## 2022-01-29 MED ORDER — METOPROLOL TARTRATE 25 MG PO TABS: 25.0000 mg | ORAL_TABLET | Freq: Two times a day (BID) | ORAL | 1 refills | Status: DC

## 2022-01-29 NOTE — Progress Notes (Signed)
Office Visit    Patient Name: Kristen Suarez Date of Encounter: 01/29/2022  PCP:  Flossie Buffy, NP   [place]  Cardiologist:  Skeet Latch, MD / Dr. Fletcher Anon (PAD) Advanced Practice Provider:  No care team member to display Electrophysiologist:  Virl Axe, MD      Chief Complaint    Kristen Suarez is a 76 y.o. female  presents today for follow up after CABG.  Past Medical History    Past Medical History:  Diagnosis Date   Aortic valve sclerosis 09/25/2021   Arthritis    Asthma    CAD in native artery 08/15/2020   Chickenpox    Colon polyps 2010   Epistaxis 03/29/2021   Heart murmur    History of fainting spells of unknown cause    Hyperlipidemia    Hypertension    Renal artery stenosis ([place]) 09/25/2021   Sarcoid 06/16/1976   Past Surgical History:  Procedure Laterality Date   ABDOMINAL HYSTERECTOMY  1987   BASAL CELL CARCINOMA EXCISION  06/30/2020   nose   CLIPPING OF ATRIAL APPENDAGE N/A 01/04/2022   Procedure: CLIPPING OF ATRIAL APPENDAGE USING SIZE 40 ATRICURE ATRIAL CLIP;  Surgeon: Neomia Glass, MD;  Location: [facility];  Service: Open Heart Surgery;  Laterality: N/A;   CORONARY ARTERY BYPASS GRAFT N/A 01/04/2022   Procedure: CORONARY ARTERY BYPASS GRAFTING (CABG) x  USING ENDOSCOPIC GREATER SAPHENOUS VEIN HARVEST;  Surgeon: Neomia Glass, MD;  Location: [facility];  Service: Open Heart Surgery;  Laterality: N/A;   LEFT HEART CATH AND CORONARY ANGIOGRAPHY N/A 01/02/2022   Procedure: LEFT HEART CATH AND CORONARY ANGIOGRAPHY;  Surgeon: Troy Sine, MD;  Location: [facility];  Service: Cardiovascular;  Laterality: N/A;   LUNG BIOPSY  06/16/1976   PERIPHERAL VASCULAR INTERVENTION Right 11/29/2020   Procedure: PERIPHERAL VASCULAR INTERVENTION;  Surgeon: Wellington Hampshire, MD;  Location: [facility];  Service: Cardiovascular;  Laterality: Right;  Renal Artery   RENAL ANGIOGRAPHY N/A 11/29/2020   Procedure: RENAL  ANGIOGRAPHY;  Surgeon: Wellington Hampshire, MD;  Location: [facility];  Service: Cardiovascular;  Laterality: N/A;   TEE WITHOUT CARDIOVERSION N/A 01/04/2022   Procedure: TRANSESOPHAGEAL ECHOCARDIOGRAM (TEE);  Surgeon: Neomia Glass, MD;  Location: [facility];  Service: Open Heart Surgery;  Laterality: N/A;    Allergies  Allergies  Allergen Reactions   Keflex [Cephalexin] Rash   Penicillins Itching   Sulfa Antibiotics Itching    History of Present Illness    Kristen Suarez is a 76 y.o. female with a hx of renovascular hypertension, syncope, CAD s/p CABGx4, pulmonary sarcoidosis, asthma, hyperlipidemia  last seen while hospitalized  She had ILR placed in [place] January 2019 due to syncope. This is now followed by Dr. Caryl Comes. She was referred by her primary care provider to Dr. Oval Linsey for management of hypertension. Renal artery duplex June 2022 with significant right renal artery stenosis with peak velocity 291 with bruit, no flow into left renal artery. She had angiography with Dr. 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 A few days post procedure she had hypotension and near syncope, HCTZ and Spironolactone were discontinued.   Subsequently developed hypertension and Amlodipine, Irbesartan increased. She was seen in follow-up 01/22/2021.  She was recommended to change her amlodipine to the evening.  Echocardiogram  performed 02/16/2021 revealed LVEF 60 to 65%, mid LV cavitary dynamic gradient due to hyperdynamic LVEF, no RWMA, grade 1 diastolic dysfunction, small pericardial effusion, aortic valve sclerosis without stenosis.  Carotid duplex 02/2021 with bilateral vessels near normal with only minimal thickening or plaque. 03/2021 Irbesartan increased to 188m BID.   At follow up 09/2021 she was transitioned to Amlodipine/Valsartan/HCTZ (10/160/12.5)  to limit pill burden. Seen 10/29/21 by Dr. KCaryl Comes Due to dyspnea [name] added for HFpEF and cardiac CT ordered and performed 11/20/21 showing severe CAD. Admitted 12/31/21 due to syncope. Subsequent LHC 01/02/22 with severely calcified coronary arteries with multivessel CAD recommended for CABG. 01/04/22 underwent CABG x 4 (LIMA - LAD, SVG - OM, SVG - PDA, SVG - Diag) and Left atrial appendage closure (452mAtriclip). Echo with LVEF >75%, trivial MR, small PFO not excluded. Carotid duplex with no significant stsenosis. Did have postoperative atrial fibrillation and was discharged on Amiodarone.   She saw cardiothoracic surgery 01/24/22 and due to celluitis started on 7 days of Leviquin. Bilateral LE duplex with no DVT.  She presents today for follow up with her husband who is a retired RNTherapist, sportsNotes progress is slow with still a fair amount of discomfort. Notes she has lost some hearing in her right ear which surgical team is aware of. Appetite has been increasing. Notes she  is waking up to use the restroom 3-5 times per night. Does drink a lot of fluid at home. She is doing physical therapy at home. Pain predominantly controlled with Tylenol.   EKGs/Labs/Other Studies Reviewed:   The following studies were reviewed today:  LHC 01/02/22    Prox Cx to Mid Cx lesion is 70% stenosed.   1st Mrg lesion is 85% stenosed.   2nd Mrg lesion is 80% stenosed.   Mid LM to Dist LM lesion is 20% stenosed.   Ost LAD to Mid LAD lesion is 60% stenosed.   1st Diag lesion is 90% stenosed.   Mid LAD lesion is 80% stenosed.   Prox RCA-1 lesion is 80% stenosed.   Prox RCA-2 lesion is 95% stenosed.   Mid RCA lesion is 70% stenosed.   Mid RCA to Dist RCA lesion is 80% stenosed.   3rd RPL lesion is 80% stenosed.   Severely calcified coronary arteries with multivessel CAD with calcification extending into the distal left main, diffuse 90% first diagonal stenosis, 60 to 80% proximal to mid LAD stenoses; left circumflex  stenoses of 85% in a moderate-sized first marginal,70% in the circumflex before the OM 2 with 80% OM 2 stenosis; and severely calcified diffuse disease in the proximal extending into the mid distal RCA with 80% focal PLA stenosis.   LVEDP 22 mmHg.   RECOMMENDATION: Surgical consultation for CABG revascularization surgery.       Echo 12/2021   1. Left ventricular ejection fraction, by estimation, is >75%. The left  ventricle has hyperdynamic function. The left ventricle has no regional  wall motion abnormalities. Left ventricular diastolic parameters are  consistent with Grade I diastolic  dysfunction (impaired relaxation).   2. Right ventricular systolic function is mildly reduced. The right  ventricular size is normal.   3. The mitral valve is abnormal. Trivial mitral valve regurgitation.   4. The aortic valve is tricuspid. Aortic valve regurgitation is not  visualized.   5. The inferior vena cava is normal in size with greater than 50%  respiratory variability, suggesting right atrial pressure of 3 mmHg.   6. Cannot exclude a small  PFO.   Comparison(s): Changes from prior study are noted. 02/16/2021: LVEF 60-65%.  Carotid duplex 02/2021 Summary:  Right Carotid: The extracranial vessels were near-normal with only minimal  wall thickening or plaque.   Left Carotid: The extracranial vessels were near-normal with only minimal  wall  thickening or plaque.   Vertebrals:  Bilateral vertebral arteries demonstrate antegrade flow.  Subclavians: Normal flow hemodynamics were seen in bilateral subclavian arteries.   Echo 02/2021 1. There is a mid LV cavitary dynamic gradient due to hyperdynamic LVF. Marland Kitchen  Left ventricular ejection fraction, by estimation, is 60 to 65%. Left  ventricular ejection fraction by 3D volume is 64 %. The left ventricle has  normal function. The left  ventricle has no regional wall motion abnormalities. Left ventricular  diastolic parameters are consistent  with Grade I diastolic dysfunction  (impaired relaxation). The average left ventricular global longitudinal  strain is -16.8 %. The global  longitudinal strain is normal.   2. Right ventricular systolic function is normal. The right ventricular  size is normal. There is normal pulmonary artery systolic pressure. The  estimated right ventricular systolic pressure is 9.1 mmHg.   3. The pericardial effusion is anterior to the right ventricle.   4. The mitral valve is normal in structure. No evidence of mitral valve  regurgitation. No evidence of mitral stenosis.   5. The aortic valve is calcified. Aortic valve regurgitation is not  visualized. Aortic valve sclerosis/calcification is present, without any  evidence of aortic stenosis. Aortic valve area, by VTI measures 1.88 cm.  Aortic valve mean gradient measures  5.3 mmHg. Aortic valve Vmax measures 1.64 m/s.   6. The inferior vena cava is normal in size with greater than 50%  respiratory variability, suggesting right atrial pressure of 3 mmHg.   Comparison(s): EF 60%, mild MAC, moderate aortic sclerosis with no  evidence of stenosis, IAS lipomatous.   12/29/2020 CT Chest new small pericardial effusion noted.   EKG:  EKG ordered today. EKG performed today demonstrates NSR 66 bpm with TWI to V1-3.   Recent Labs: 12/31/2021: ALT 16 01/12/2022: BUN 16; Creatinine, Ser 1.20; Potassium 4.7; Sodium 135 01/13/2022: Magnesium 2.1 01/15/2022: Hemoglobin 9.5; Platelets 637  Recent Lipid Panel    Component Value Date/Time   CHOL 141 01/03/2022 0320   CHOL 209 (H) 11/21/2020 0918   TRIG 82 01/03/2022 0320   HDL 49 01/03/2022 0320   HDL 50 11/21/2020 0918   CHOLHDL 2.9 01/03/2022 0320   VLDL 16 01/03/2022 0320   LDLCALC 76 01/03/2022 0320   LDLCALC 128 (H) 11/21/2020 0918   Home Medications   Current Meds  Medication Sig   acetaminophen (TYLENOL) 500 MG tablet Take 500 mg by mouth every 6 (six) hours as needed for moderate pain.    albuterol (PROVENTIL) (2.5 MG/3ML) 0.083% nebulizer solution Take 3 mLs (2.5 mg total) by nebulization every 6 (six) hours as needed for wheezing or shortness of breath. USE 1 VIAL VIA NEBULIZER EVERY 6 HOURS Strength: (2.5 MG/3ML) 0.083%   amiodarone (PACERONE) 200 MG tablet Take 1 tablet (200 mg total) by mouth 2 (two) times daily. For 14 days then reduce the dose to 1 tablet or 289m once daily.   amLODipine (NORVASC) 5 MG tablet Take 1 tablet (5 mg total) by mouth daily.   aspirin EC 325 MG tablet Take 1 tablet (325 mg total) by mouth daily.   BREZTRI AEROSPHERE 160-9-4.8 MCG/ACT AERO INHALE 2 PUFFS INTO THE LUNGS IN THE MORNING AND AT  BEDTIME.   Calcium Carb-Cholecalciferol (CALCIUM 600 + D PO) Take 1 tablet by mouth daily.   cetirizine (ZYRTEC) 10 MG tablet Take 10 mg by mouth daily as needed for allergies.   docusate sodium (COLACE) 100 MG capsule Take 100 mg by mouth daily.    empagliflozin ([name]) 10 MG TABS tablet Take 1 tablet (10 mg total) by mouth daily before breakfast.   levofloxacin (LEVAQUIN) 250 MG tablet Take 1 tablet (250 mg total) by mouth daily.   metoprolol tartrate (LOPRESSOR) 25 MG tablet Take 1 tablet (25 mg total) by mouth 2 (two) times daily.   mometasone (NASONEX) 50 MCG/ACT nasal spray SHAKE LIQUID AND USE 2 SPRAYS IN EACH NOSTRIL DAILY   Multiple Vitamins-Minerals (MULTIVITAL PO) Take 1 tablet by mouth daily.    pantoprazole (PROTONIX) 40 MG tablet TAKE 1 TABLET BY MOUTH EVERY DAY   Probiotic Product (PROBIOTIC DAILY PO) Take 1 tablet by mouth daily.    rosuvastatin (CRESTOR) 40 MG tablet TAKE 1 TABLET BY MOUTH EVERY DAY   sodium chloride (OCEAN) 0.65 % SOLN nasal spray Place 1 spray into both nostrils as needed for congestion.    Review of Systems      All other systems reviewed and are otherwise negative except as noted above.  Physical Exam    VS:  BP (!) 146/70 (BP Location: Left Arm, Patient Position: Sitting, Cuff Size: Normal)   Pulse 66   Ht _0   (1.575 m)   Wt 135 lb 6.4 oz (61.4 kg)   BMI 24.76 kg/m  , BMI Body mass index is 24.76 kg/m.  Wt Readings from Last 3 Encounters:  01/29/22 135 lb 6.4 oz (61.4 kg)  01/24/22 133 lb (60.3 kg)  01/17/22 133 lb 3.2 oz (60.4 kg)    GEN: Well nourished, well developed, in no acute distress. HEENT: normal. Neck: Supple, no JVD, carotid bruits, or masses. Cardiac: RRR, no  rubs, or gallops. No clubbing, cyanosis.  Radials/PT 2+ and equal bilaterally. Gr 4-6/9 systolic murmur. Nonpitting RLE edema.  Respiratory:  Respirations regular and unlabored, clear to auscultation bilaterally. GI: Soft, nontender, nondistended. MS: No deformity or atrophy. Skin: Warm and dry, no rash. Midsternal incision with well approximated edges and no evidence of infection.  Neuro:  Strength and sensation are intact. Psych: Normal affect.  Assessment & Plan    Syncope - Prior syncope vasovagal in nature. ILR has reached end of device battery life with no significant arrhythmias noted. Echo 12/2021 LVEF >75%, no RWMA, gr1DD, RVSF mildly reduced, trivial MR.  Carotid duplex 12/2021 with no significant stenosis. No indication for further workup at this.   HTN -BP mildly elevated in clinic though well controlled at home. Discussed to monitor BP at home at least 2 hours after medications and sitting for 5-10 minutes. Continue Amlodipine 31m daily. If BP elevated at, consider addition of Valsartan or increased dose Amlodipine.   Renal artery stenosis - s/p stenting to right renal artery. Follows with Dr. AFletcher Anon   HLD - 01/03/22 LDL 76. Continue rosuvastatin 40 mg daily.  Given LDL not at goal < 70, start Repatha and repeat FLP/LFT in 3 months.  CAD s/p CABGx4 12/2021 /postoperative atrial fibrillation/on amiodarone therapy-   Stable with no anginal symptoms. Pain post procedure managed with Tylenol. Has been gradually increasing activity. Encouraged to participate in cardiac rehab. Appropriate to begin cardiac rehab.  Continue GDMT Aspirin, [name], metoprolol, rosuvastatin.  Does note some nocturia with [name].  Encourage decreased evening fluids though  if persistently bothersome could trial off [name].  She did have postoperative atrial fibrillation and will continue amiodarone 200 mg daily for 2 months then discontinue.  She reports no recurrent palpitations.  Pulmonary sarcoidosis - Follows with pulmonology.   Disposition: Follow up in 3 month(s) with Dr. Oval Linsey or APP.  Signed, Loel Dubonnet, NP 01/29/2022, 10:10 AM [place]

## 2022-01-29 NOTE — Patient Instructions (Signed)
Medication Instructions:  Your physician has recommended you make the following change in your medication:   After 2 months, you may discontinue Amiodarone  START Repatha one injection every 2 weeks   *If you need a refill on your cardiac medications before your next appointment, please call your pharmacy*  Lab Work: Your physician recommends that you return for lab work in 3 months for fasting lipid panel and CMP  Please return for Lab work. You may come to the...   [facility] (3rd floor) [address] [place], [place] [address]  Open: 8am-Noon and 1pm-4:30pm  Please ring the doorbell on the small table when you exit the elevator and the Lab Tech will come get you  [place] [facility] [address] [place], [place], [place] [address] Open: 8am-1pm, then 2pm-4:30pm   [place]- Please see attached locations sheet stapled to your lab work with address and hours.    If you have labs (blood work) drawn today and your tests are completely normal, you will receive your results only by: [place] (if you have MyChart) OR A paper copy in the mail If you have any lab test that is abnormal or we need to change your treatment, we will call you to review the results.   Testing/Procedures: None ordered today.    Follow-Up: At [facility], you and your health needs are our priority.  As part of our continuing mission to provide you with exceptional heart care, we have created designated Provider Care Teams.  These Care Teams include your primary Cardiologist (physician) and Advanced Practice Providers (APPs -  Physician Assistants and Nurse Practitioners) who all work together to provide you with the care you need, when you need it.  We recommend signing up for the patient portal called "MyChart".  Sign up information is provided on this After Visit Summary.  MyChart is used to connect with patients for Virtual Visits  (Telemedicine).  Patients are able to view lab/test results, encounter notes, upcoming appointments, etc.  Non-urgent messages can be sent to your provider as well.   To learn more about what you can do with MyChart, go to [email or web address].    Your next appointment:   3 month(s)  The format for your next appointment:   In Person  Provider:   Skeet Latch, MD or Laurann Montana, NP    Other Instructions  Heart Healthy Diet Recommendations: A low-salt diet is recommended. Meats should be grilled, baked, or boiled. Avoid fried foods. Focus on lean protein sources like fish or chicken with vegetables and fruits. The American Heart Association is a Microbiologist!  American Heart Association Diet and Lifeystyle Recommendations   Exercise recommendations: The American Heart Association recommends 150 minutes of moderate intensity exercise weekly. Try 30 minutes of moderate intensity exercise 4-5 times per week. This could include walking, jogging, or swimming.   Important Information About Sugar

## 2022-01-29 NOTE — Telephone Encounter (Signed)
Called patient pharmacy, repatha does not require prior approval and they will have in stock tomorrow.   Patient notified via voicemail.

## 2022-01-31 ENCOUNTER — Ambulatory Visit (INDEPENDENT_AMBULATORY_CARE_PROVIDER_SITE_OTHER): Payer: Self-pay | Admitting: Cardiothoracic Surgery

## 2022-01-31 VITALS — BP 121/71 | HR 65 | Resp 20 | Ht 62.0 in

## 2022-01-31 DIAGNOSIS — Z951 Presence of aortocoronary bypass graft: Secondary | ICD-10-CM

## 2022-01-31 DIAGNOSIS — L03818 Cellulitis of other sites: Secondary | ICD-10-CM

## 2022-01-31 DIAGNOSIS — I2510 Atherosclerotic heart disease of native coronary artery without angina pectoris: Secondary | ICD-10-CM

## 2022-02-01 ENCOUNTER — Encounter (HOSPITAL_BASED_OUTPATIENT_CLINIC_OR_DEPARTMENT_OTHER): Payer: Self-pay

## 2022-02-01 ENCOUNTER — Encounter: Payer: Self-pay | Admitting: Nurse Practitioner

## 2022-02-01 ENCOUNTER — Encounter: Payer: Self-pay | Admitting: Cardiothoracic Surgery

## 2022-02-02 NOTE — Progress Notes (Signed)
  Doing reasonably s/p CAB / LAA clip 01/04/22   Patient doing better than last week. Walking more .       Has done 2 weeks abx - levaquin for redness around incision. No pain/discharge Skin looks fully resolved  Vitals:   01/31/22 1500  BP: 121/71  Pulse: 65  Resp: 20  SpO2: 97%    2-3 week follow-up

## 2022-02-04 ENCOUNTER — Ambulatory Visit (INDEPENDENT_AMBULATORY_CARE_PROVIDER_SITE_OTHER): Payer: Medicare Other | Admitting: Nurse Practitioner

## 2022-02-04 ENCOUNTER — Encounter: Payer: Self-pay | Admitting: Nurse Practitioner

## 2022-02-04 VITALS — BP 137/71 | HR 68 | Temp 96.8°F | Ht 62.0 in | Wt 134.8 lb

## 2022-02-04 DIAGNOSIS — H9191 Unspecified hearing loss, right ear: Secondary | ICD-10-CM | POA: Diagnosis not present

## 2022-02-04 NOTE — Progress Notes (Signed)
   Acute Office Visit  Subjective:     Patient ID: Kristen Suarez, female    DOB: 01/03/1946, 76 y.o.   MRN: [id number]  Chief Complaint  Patient presents with   Acute Visit    Pt states that she is having hearing loss after surgery x 1 month     HPI Patient is in today for hearing loss in her right ear since she had surgery on 01/04/22. She had an open heart surgery and since the day after surgery, she has noticed decreased hearing out of her right ear. She has also noticed feeling unsteady on her feet at times. She has been walking with a walker. Her hearing loss has not improved or worsened since after the surgery. She denies ear pain.   ROS See pertinent positives and negatives per HPI.     Objective:    BP 137/71 (BP Location: Left Arm, Patient Position: Sitting, Cuff Size: Normal)   Pulse 68   Temp (!) 96.8 F (36 C)   Ht '5\' 2"'$  (1.575 m)   Wt 134 lb 12.8 oz (61.1 kg)   SpO2 99%   BMI 24.66 kg/m    Physical Exam Vitals and nursing note reviewed.  Constitutional:      General: She is not in acute distress.    Appearance: Normal appearance.  HENT:     Head: Normocephalic.     Right Ear: Tympanic membrane, ear canal and external ear normal.     Left Ear: Tympanic membrane, ear canal and external ear normal.     Ears:     Comments: Failed whisper test right ear Eyes:     Conjunctiva/sclera: Conjunctivae normal.  Cardiovascular:     Rate and Rhythm: Normal rate and regular rhythm.     Pulses: Normal pulses.     Heart sounds: Normal heart sounds.  Pulmonary:     Effort: Pulmonary effort is normal.     Breath sounds: Normal breath sounds.  Musculoskeletal:     Cervical back: Normal range of motion.  Skin:    General: Skin is warm.  Neurological:     General: No focal deficit present.     Mental Status: She is alert and oriented to person, place, and time.  Psychiatric:        Mood and Affect: Mood normal.        Behavior: Behavior normal.        Thought  Content: Thought content normal.        Judgment: Judgment normal.       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  Her ear exam appears normal bilaterally.  She failed the whisper test on the right ear.  We will place a referral to ENT with sudden hearing loss.      Relevant Orders   Ambulatory referral to ENT    No orders of the defined types were placed in this encounter.   Return if symptoms worsen or fail to improve.  Charyl Dancer, NP

## 2022-02-04 NOTE — Patient Instructions (Signed)
It was great to see you!  I have placed a referral to ENT for your hearing loss.   Let's follow-up if you have any concerns  Take care,  Vance Peper, NP

## 2022-02-04 NOTE — Assessment & Plan Note (Signed)
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  Her ear exam appears normal bilaterally.  She failed the whisper test on the right ear.  We will place a referral to ENT with sudden hearing loss.

## 2022-02-14 ENCOUNTER — Other Ambulatory Visit: Payer: Self-pay

## 2022-02-14 ENCOUNTER — Ambulatory Visit (INDEPENDENT_AMBULATORY_CARE_PROVIDER_SITE_OTHER): Payer: Self-pay | Admitting: Cardiothoracic Surgery

## 2022-02-14 VITALS — BP 133/75 | HR 72 | Resp 20 | Ht 62.0 in | Wt 134.0 lb

## 2022-02-14 DIAGNOSIS — Z951 Presence of aortocoronary bypass graft: Secondary | ICD-10-CM

## 2022-02-14 DIAGNOSIS — I2510 Atherosclerotic heart disease of native coronary artery without angina pectoris: Secondary | ICD-10-CM

## 2022-02-14 DIAGNOSIS — L03818 Cellulitis of other sites: Secondary | ICD-10-CM

## 2022-02-14 NOTE — Progress Notes (Signed)
Referral placed to [facility] per Dr. Binnie Kand orders.

## 2022-02-25 ENCOUNTER — Ambulatory Visit (INDEPENDENT_AMBULATORY_CARE_PROVIDER_SITE_OTHER): Payer: Medicare Other

## 2022-02-25 VITALS — Ht 62.75 in | Wt 133.0 lb

## 2022-02-25 DIAGNOSIS — Z0000 Encounter for general adult medical examination without abnormal findings: Secondary | ICD-10-CM | POA: Diagnosis not present

## 2022-02-25 NOTE — Patient Instructions (Addendum)
[name] , Thank you for taking time to come for your Medicare Wellness Visit. I appreciate your ongoing commitment to your health goals. Please review the following plan we discussed and let me know if I can assist you in the future.   These are the goals we discussed:  Goals      Increase physical activity     Patient Stated     02/25/2022, Patient wants to recover from surgery        This is a list of the screening recommended for you and due dates:  Health Maintenance  Topic Date Due   Zoster (Shingles) Vaccine (2 of 2) 03/26/2021   COVID-19 Vaccine (7 - 2023-24 season) 02/17/2022   DTaP/Tdap/Td vaccine (2 - Td or Tdap) 03/24/2022   Medicare Annual Wellness Visit  02/26/2023   Pneumonia Vaccine  Completed   Flu Shot  Completed   DEXA scan (bone density measurement)  Completed   HPV Vaccine  Aged Out   Colon Cancer Screening  Discontinued   Hepatitis C Screening: USPSTF Recommendation to screen - Ages 42-79 yo.  Discontinued    Advanced directives: Please bring a copy of your POA (Power of Attorney) and/or Living Will to your next appointment.   Conditions/risks identified: none  Next appointment: Follow up in one year for your annual wellness visit    Preventive Care 65 Years and Older, Female Preventive care refers to lifestyle choices and visits with your health care provider that can promote health and wellness. What does preventive care include? A yearly physical exam. This is also called an annual well check. Dental exams once or twice a year. Routine eye exams. Ask your health care provider how often you should have your eyes checked. Personal lifestyle choices, including: Daily care of your teeth and gums. Regular physical activity. Eating a healthy diet. Avoiding tobacco and drug use. Limiting alcohol use. Practicing safe sex. Taking low-dose aspirin every day. Taking vitamin and mineral supplements as recommended by your health care provider. What  happens during an annual well check? The services and screenings done by your health care provider during your annual well check will depend on your age, overall health, lifestyle risk factors, and family history of disease. Counseling  Your health care provider may ask you questions about your: Alcohol use. Tobacco use. Drug use. Emotional well-being. Home and relationship well-being. Sexual activity. Eating habits. History of falls. Memory and ability to understand (cognition). Work and work Statistician. Reproductive health. Screening  You may have the following tests or measurements: Height, weight, and BMI. Blood pressure. Lipid and cholesterol levels. These may be checked every 5 years, or more frequently if you are over 18 years old. Skin check. Lung cancer screening. You may have this screening every year starting at age 76 if you have a 30-pack-year history of smoking and currently smoke or have quit within the past 15 years. Fecal occult blood test (FOBT) of the stool. You may have this test every year starting at age 76. Flexible sigmoidoscopy or colonoscopy. You may have a sigmoidoscopy every 5 years or a colonoscopy every 10 years starting at age 76. Hepatitis C blood test. Hepatitis B blood test. Sexually transmitted disease (STD) testing. Diabetes screening. This is done by checking your blood sugar (glucose) after you have not eaten for a while (fasting). You may have this done every 1-3 years. Bone density scan. This is done to screen for osteoporosis. You may have this done starting at age 76.  Mammogram. This may be done every 1-2 years. Talk to your health care provider about how often you should have regular mammograms. Talk with your health care provider about your test results, treatment options, and if necessary, the need for more tests. Vaccines  Your health care provider may recommend certain vaccines, such as: Influenza vaccine. This is recommended every  year. Tetanus, diphtheria, and acellular pertussis (Tdap, Td) vaccine. You may need a Td booster every 10 years. Zoster vaccine. You may need this after age 76. Pneumococcal 13-valent conjugate (PCV13) vaccine. One dose is recommended after age 76. Pneumococcal polysaccharide (PPSV23) vaccine. One dose is recommended after age 76. Talk to your health care provider about which screenings and vaccines you need and how often you need them. This information is not intended to replace advice given to you by your health care provider. Make sure you discuss any questions you have with your health care provider. Document Released: 03/31/2015 Document Revised: 11/22/2015 Document Reviewed: 01/03/2015 Elsevier Interactive Patient Education  2017 [employer] Prevention in the Home Falls can cause injuries. They can happen to people of all ages. There are many things you can do to make your home safe and to help prevent falls. What can I do on the outside of my home? Regularly fix the edges of walkways and driveways and fix any cracks. Remove anything that might make you trip as you walk through a door, such as a raised step or threshold. Trim any bushes or trees on the path to your home. Use bright outdoor lighting. Clear any walking paths of anything that might make someone trip, such as rocks or tools. Regularly check to see if handrails are loose or broken. Make sure that both sides of any steps have handrails. Any raised decks and porches should have guardrails on the edges. Have any leaves, snow, or ice cleared regularly. Use sand or salt on walking paths during winter. Clean up any spills in your garage right away. This includes oil or grease spills. What can I do in the bathroom? Use night lights. Install grab bars by the toilet and in the tub and shower. Do not use towel bars as grab bars. Use non-skid mats or decals in the tub or shower. If you need to sit down in the shower, use a  plastic, non-slip stool. Keep the floor dry. Clean up any water that spills on the floor as soon as it happens. Remove soap buildup in the tub or shower regularly. Attach bath mats securely with double-sided non-slip rug tape. Do not have throw rugs and other things on the floor that can make you trip. What can I do in the bedroom? Use night lights. Make sure that you have a light by your bed that is easy to reach. Do not use any sheets or blankets that are too big for your bed. They should not hang down onto the floor. Have a firm chair that has side arms. You can use this for support while you get dressed. Do not have throw rugs and other things on the floor that can make you trip. What can I do in the kitchen? Clean up any spills right away. Avoid walking on wet floors. Keep items that you use a lot in easy-to-reach places. If you need to reach something above you, use a strong step stool that has a grab bar. Keep electrical cords out of the way. Do not use floor polish or wax that makes floors slippery. If  you must use wax, use non-skid floor wax. Do not have throw rugs and other things on the floor that can make you trip. What can I do with my stairs? Do not leave any items on the stairs. Make sure that there are handrails on both sides of the stairs and use them. Fix handrails that are broken or loose. Make sure that handrails are as long as the stairways. Check any carpeting to make sure that it is firmly attached to the stairs. Fix any carpet that is loose or worn. Avoid having throw rugs at the top or bottom of the stairs. If you do have throw rugs, attach them to the floor with carpet tape. Make sure that you have a light switch at the top of the stairs and the bottom of the stairs. If you do not have them, ask someone to add them for you. What else can I do to help prevent falls? Wear shoes that: Do not have high heels. Have rubber bottoms. Are comfortable and fit you  well. Are closed at the toe. Do not wear sandals. If you use a stepladder: Make sure that it is fully opened. Do not climb a closed stepladder. Make sure that both sides of the stepladder are locked into place. Ask someone to hold it for you, if possible. Clearly mark and make sure that you can see: Any grab bars or handrails. First and last steps. Where the edge of each step is. Use tools that help you move around (mobility aids) if they are needed. These include: Canes. Walkers. Scooters. Crutches. Turn on the lights when you go into a dark area. Replace any light bulbs as soon as they burn out. Set up your furniture so you have a clear path. Avoid moving your furniture around. If any of your floors are uneven, fix them. If there are any pets around you, be aware of where they are. Review your medicines with your doctor. Some medicines can make you feel dizzy. This can increase your chance of falling. Ask your doctor what other things that you can do to help prevent falls. This information is not intended to replace advice given to you by your health care provider. Make sure you discuss any questions you have with your health care provider. Document Released: 12/29/2008 Document Revised: 08/10/2015 Document Reviewed: 04/08/2014 Elsevier Interactive Patient Education  2017 [employer].

## 2022-02-25 NOTE — Progress Notes (Signed)
I connected with Kristen Suarez today by telephone and verified that I am speaking with the correct person using two identifiers. Location patient: home Location provider: work Persons participating in the virtual visit: Kristen Suarez, Glenna Durand LPN.   I discussed the limitations, risks, security and privacy concerns of performing an evaluation and management service by telephone and the availability of in person appointments. I also discussed with the patient that there may be a patient responsible charge related to this service. The patient expressed understanding and verbally consented to this telephonic visit.    Interactive audio and video telecommunications were attempted between this provider and patient, however failed, due to patient having technical difficulties OR patient did not have access to video capability.  We continued and completed visit with audio only.     Vital signs may be patient reported or missing.  Subjective:   Kristen Suarez is a 76 y.o. female who presents for Medicare Annual (Subsequent) preventive examination.  Review of Systems     Cardiac Risk Factors include: advanced age (>85mn, >>50women);dyslipidemia;hypertension     Objective:    Today's Vitals   02/25/22 0911 02/25/22 0912  Weight: 133 lb (60.3 kg)   Height: 5' 2.75" (1.594 m)   PainSc:  3    Body mass index is 23.75 kg/m.     02/25/2022    9:18 AM 01/04/2022    6:46 AM 01/01/2022    1:00 PM 12/31/2021   11:09 AM 11/15/2021    9:53 AM 05/17/2021    8:56 AM 04/17/2021    5:19 PM  Advanced Directives  Does Patient Have a Medical Advance Directive? Yes No No No Yes No No  Type of [place]    [place] Chart? No - copy requested    No - copy requested    Would patient like information on creating a medical advance directive?   No - Patient declined No - Patient  declined  No - Patient declined No - Patient declined    Current Medications (verified) Outpatient Encounter Medications as of 02/25/2022  Medication Sig   acetaminophen (TYLENOL) 500 MG tablet Take 500 mg by mouth every 6 (six) hours as needed for moderate pain.   albuterol (PROVENTIL) (2.5 MG/3ML) 0.083% nebulizer solution Take 3 mLs (2.5 mg total) by nebulization every 6 (six) hours as needed for wheezing or shortness of breath. USE 1 VIAL VIA NEBULIZER EVERY 6 HOURS Strength: (2.5 MG/3ML) 0.083%   amiodarone (PACERONE) 200 MG tablet Take 1 tablet (200 mg total) by mouth 2 (two) times daily. For 14 days then reduce the dose to 1 tablet or '200mg'$  once daily.   amLODipine (NORVASC) 5 MG tablet Take 1 tablet (5 mg total) by mouth daily.   aspirin EC 325 MG tablet Take 1 tablet (325 mg total) by mouth daily.   BREZTRI AEROSPHERE 160-9-4.8 MCG/ACT AERO INHALE 2 PUFFS INTO THE LUNGS IN THE MORNING AND AT BEDTIME.   Calcium Carb-Cholecalciferol (CALCIUM 600 + D PO) Take 1 tablet by mouth daily.   cetirizine (ZYRTEC) 10 MG tablet Take 10 mg by mouth daily as needed for allergies.   docusate sodium (COLACE) 100 MG capsule Take 100 mg by mouth daily.    empagliflozin ([name]) 10 MG TABS tablet Take 1 tablet (10 mg total) by mouth daily before breakfast.   Evolocumab (REPATHA SURECLICK) 1443MG/ML SOAJ Inject 140 mg  into the skin every 14 (fourteen) days.   metoprolol tartrate (LOPRESSOR) 25 MG tablet Take 1 tablet (25 mg total) by mouth 2 (two) times daily.   mometasone (NASONEX) 50 MCG/ACT nasal spray SHAKE LIQUID AND USE 2 SPRAYS IN EACH NOSTRIL DAILY   Multiple Vitamins-Minerals (MULTIVITAL PO) Take 1 tablet by mouth daily.    pantoprazole (PROTONIX) 40 MG tablet TAKE 1 TABLET BY MOUTH EVERY DAY   Probiotic Product (PROBIOTIC DAILY PO) Take 1 tablet by mouth daily.    rosuvastatin (CRESTOR) 40 MG tablet TAKE 1 TABLET BY MOUTH EVERY DAY   sodium chloride (OCEAN) 0.65 % SOLN nasal spray Place 1  spray into both nostrils as needed for congestion.   levofloxacin (LEVAQUIN) 250 MG tablet Take 1 tablet (250 mg total) by mouth daily.   No facility-administered encounter medications on file as of 02/25/2022.    Allergies (verified) Keflex [cephalexin], Penicillins, and Sulfa antibiotics   History: Past Medical History:  Diagnosis Date   Aortic valve sclerosis 09/25/2021   Arthritis    Asthma    CAD in native artery 08/15/2020   Chickenpox    Colon polyps 2010   Epistaxis 03/29/2021   Heart murmur    History of fainting spells of unknown cause    Hyperlipidemia    Hypertension    Renal artery stenosis ([place]) 09/25/2021   Sarcoid 06/16/1976   Past Surgical History:  Procedure Laterality Date   ABDOMINAL HYSTERECTOMY  1987   BASAL CELL CARCINOMA EXCISION  06/30/2020   nose   CLIPPING OF ATRIAL APPENDAGE N/A 01/04/2022   Procedure: CLIPPING OF ATRIAL APPENDAGE USING SIZE 40 ATRICURE ATRIAL CLIP;  Surgeon: Neomia Glass, MD;  Location: [place];  Service: Open Heart Surgery;  Laterality: N/A;   CORONARY ARTERY BYPASS GRAFT N/A 01/04/2022   Procedure: CORONARY ARTERY BYPASS GRAFTING (CABG) x  USING ENDOSCOPIC GREATER SAPHENOUS VEIN HARVEST;  Surgeon: Neomia Glass, MD;  Location: [place];  Service: Open Heart Surgery;  Laterality: N/A;   LEFT HEART CATH AND CORONARY ANGIOGRAPHY N/A 01/02/2022   Procedure: LEFT HEART CATH AND CORONARY ANGIOGRAPHY;  Surgeon: Troy Sine, MD;  Location: [place] [facility];  Service: Cardiovascular;  Laterality: N/A;   LUNG BIOPSY  06/16/1976   PERIPHERAL VASCULAR INTERVENTION Right 11/29/2020   Procedure: PERIPHERAL VASCULAR INTERVENTION;  Surgeon: Wellington Hampshire, MD;  Location: [place] [facility];  Service: Cardiovascular;  Laterality: Right;  Renal Artery   RENAL ANGIOGRAPHY N/A 11/29/2020   Procedure: RENAL ANGIOGRAPHY;  Surgeon: Wellington Hampshire, MD;  Location: [place] [facility];  Service: Cardiovascular;  Laterality: N/A;   TEE WITHOUT  CARDIOVERSION N/A 01/04/2022   Procedure: TRANSESOPHAGEAL ECHOCARDIOGRAM (TEE);  Surgeon: Neomia Glass, MD;  Location: [place];  Service: Open Heart Surgery;  Laterality: N/A;   Family History  Problem Relation Age of Onset   Arthritis Mother    Cancer Mother    Depression Mother    Heart attack Mother    Hyperlipidemia Mother    Hypertension Mother    Alcohol abuse Father    Heart disease Father    Hypertension Father    Hyperlipidemia Father    Heart disease Sister    Stroke Sister    Alcohol abuse Brother    Heart disease Brother    Hypertension Brother    Hyperlipidemia Brother    Multiple sclerosis Daughter    Hyperlipidemia Son    Heart disease Maternal Grandfather    Heart disease Paternal  Grandfather    Hyperlipidemia Sister    Alcohol abuse Sister    Hyperlipidemia Sister    Hypertension Sister    Alcohol abuse Brother    Early death Brother    Heart attack Brother    Heart disease Brother    Hyperlipidemia Brother    Social History   Socioeconomic History   Marital status: Married    Spouse name: Not on file   Number of children: Not on file   Years of education: Not on file   Highest education level: Not on file  Occupational History   Occupation: retired  Tobacco Use   Smoking status: Never    Passive exposure: Past   Smokeless tobacco: Never  Vaping Use   Vaping Use: Never used  Substance and Sexual Activity   Alcohol use: Not Currently    Comment: once in a while   Drug use: Never   Sexual activity: Not Currently  Other Topics Concern   Not on file  Social History Narrative   Not on file   Social Determinants of Health   Financial Resource Strain: [place]  (02/25/2022)   Overall Financial Resource Strain (CARDIA)    Difficulty of Paying Living Expenses: Not hard at all  Food Insecurity: No Food Insecurity (02/25/2022)   Hunger Vital Sign    Worried About Running Out of Food in the Last Year: Never true    [place] in the Last  Year: Never true  Transportation Needs: No Transportation Needs (02/25/2022)   PRAPARE - Hydrologist (Medical): No    Lack of Transportation (Non-Medical): No  Physical Activity: Sufficiently Active (02/25/2022)   Exercise Vital Sign    Days of Exercise per Week: 7 days    Minutes of Exercise per Session: 40 min  Stress: No Stress Concern Present (02/25/2022)   [place]    Feeling of Stress : Not at all  Social Connections: Moderately Isolated (02/13/2021)   Social Connection and Isolation Panel [NHANES]    Frequency of Communication with Friends and Family: Twice a week    Frequency of Social Gatherings with Friends and Family: Twice a week    Attends Religious Services: Never    Marine scientist or Organizations: No    Attends Music therapist: Never    Marital Status: Married    Tobacco Counseling Counseling given: Not Answered   Clinical Intake:  Pre-visit preparation completed: Yes  Pain : 0-10 Pain Score: 3  Pain Type: Acute pain Pain Location: Chest Pain Orientation: Left, Medial Pain Descriptors / Indicators: Aching Pain Onset: More than a month ago Pain Frequency: Intermittent     Nutritional Status: BMI of 19-24  Normal Nutritional Risks: None Diabetes: No  How often do you need to have someone help you when you read instructions, pamphlets, or other written materials from your doctor or pharmacy?: 1 - Never  Diabetic?no  Interpreter Needed?: No  Information entered by :: NAllen LPN   Activities of Daily Living    02/25/2022    9:20 AM 02/21/2022   10:43 AM  In your present state of health, do you have any difficulty performing the following activities:  Hearing? 1 0  Comment decreased right ear   Vision? 0 0  Difficulty concentrating or making decisions? 0 0  Walking or climbing stairs? 1 1  Dressing or bathing? 1 1  Doing  errands, shopping?  1 1  Preparing Food and eating ? Y Y  Using the Toilet? N N  In the past six months, have you accidently leaked urine? Y Y  Do you have problems with loss of bowel control? N N  Managing your Medications? N N  Managing your Finances? N N  Housekeeping or managing your Housekeeping? Y N    Patient Care Team: Nche, [name], NP as PCP - General (Internal Medicine) Deboraha Sprang, MD as PCP - Electrophysiology (Cardiology) Skeet Latch, MD as PCP - Cardiology (Cardiology) Feliz Beam, MD as Referring Physician (Internal Medicine) Collene Gobble, MD as Consulting Physician (Pulmonary Disease)  Indicate any recent Medical Services you may have received from other than [facility] providers in the past year (date may be approximate).     Assessment:   This is a routine wellness examination for Wajiha.  Hearing/Vision screen Vision Screening - Comments:: Regular eye exams, [facility]  Dietary issues and exercise activities discussed: Current Exercise Habits: Home exercise routine, Type of exercise: strength training/weights;stretching;walking, Time (Minutes): 40, Frequency (Times/Week): 7, Weekly Exercise (Minutes/Week): 280   Goals Addressed             This Visit's Progress    Patient Stated       02/25/2022, Patient wants to recover from surgery       Depression Screen    02/25/2022    9:20 AM 02/13/2021    1:48 PM 02/13/2021    1:39 PM 01/29/2021    2:54 PM 08/15/2020    9:37 AM 06/22/2020    1:18 PM 03/31/2020    9:56 AM  PHQ 2/9 Scores  PHQ - 2 Score 0 0 0 1 0 0 2  PHQ- 9 Score    6  0 5    Fall Risk    02/25/2022    9:18 AM 02/21/2022   10:43 AM 02/04/2022    1:58 PM 07/31/2021    8:13 AM 02/13/2021    1:40 PM  Fall Risk   Falls in the past year? '1 1 1 1 1  '$ Comment just falls      Number falls in past yr: '1 1 1 1 1  '$ Injury with Fall? '1 1 1 1 '$ 0  Comment     blood pressure issues  Risk for fall due to : Impaired  balance/gait;Impaired mobility;Medication side effect   History of fall(s) History of fall(s)  Follow up Falls evaluation completed;Education provided;Falls prevention discussed    Falls evaluation completed;Education provided    FALL RISK PREVENTION PERTAINING TO THE HOME:  Any stairs in or around the home? Yes  If so, are there any without handrails? No  Home free of loose throw rugs in walkways, pet beds, electrical cords, etc? Yes  Adequate lighting in your home to reduce risk of falls? Yes   ASSISTIVE DEVICES UTILIZED TO PREVENT FALLS:  Life alert? No  Use of a cane, walker or w/c? Yes  Grab bars in the bathroom? Yes  Shower chair or bench in shower? Yes  Elevated toilet seat or a handicapped toilet? Yes   TIMED UP AND GO:  Was the test performed? No .      Cognitive Function:    09/08/2018    9:07 AM  MMSE - Mini Mental State Exam  Orientation to time 5  Orientation to Place 5  Registration 3  Attention/ Calculation 5  Recall 3  Language- name 2 objects 2  Language- repeat 1  Language- follow 3 step command 3  Language- read & follow direction 1  Write a sentence 1  Copy design 1  Total score 30        02/25/2022    9:21 AM  6CIT Screen  What Year? 0 points  What month? 0 points  What time? 0 points  Count back from 20 0 points  Months in reverse 0 points  Repeat phrase 0 points  Total Score 0 points    Immunizations Immunization History  Administered Date(s) Administered   Fluad Quad(high Dose 65+) 11/19/2018, 12/22/2020, 11/30/2021   Influenza, High Dose Seasonal PF 01/05/2016, 12/12/2016, 12/11/2017   Influenza,inj,Quad PF,6+ Mos 12/11/2017   Influenza-Unspecified 11/24/2019, 11/24/2019   PFIZER Comirnaty(Gray Top)Covid-19 Tri-Sucrose Vaccine 06/27/2020   PFIZER(Purple Top)SARS-COV-2 Vaccination 05/14/2019, 06/08/2019, 12/20/2019   Pfizer Covid-19 Vaccine Bivalent Booster 26yr & up 12/14/2020   Pneumococcal Conjugate-13 03/23/2014    Pneumococcal Polysaccharide-23 03/24/2012, 01/22/2016   Respiratory Syncytial Virus Vaccine,Recomb Aduvanted(Arexvy) 11/30/2021   Tdap 03/24/2012   Zoster Recombinat (Shingrix) 01/29/2021    TDAP status: Up to date  Flu Vaccine status: Up to date  Pneumococcal vaccine status: Up to date  Covid-19 vaccine status: Completed vaccines  Qualifies for Shingles Vaccine? Yes   Zostavax completed No   Shingrix Completed?: Yes  Screening Tests Health Maintenance  Topic Date Due   Zoster Vaccines- Shingrix (2 of 2) 03/26/2021   Medicare Annual Wellness (AWV)  02/13/2022   COVID-19 Vaccine (7 - 2023-24 season) 02/17/2022   DTaP/Tdap/Td (2 - Td or Tdap) 03/24/2022   Pneumonia Vaccine 76 Years old  Completed   INFLUENZA VACCINE  Completed   DEXA SCAN  Completed   HPV VACCINES  Aged Out   COLONOSCOPY (Pts [phone] coverage will need to be confirmed)  Discontinued   Hepatitis C Screening  Discontinued    Health Maintenance  Health Maintenance Due  Topic Date Due   Zoster Vaccines- Shingrix (2 of 2) 03/26/2021   Medicare Annual Wellness (AWV)  02/13/2022   COVID-19 Vaccine (7 - 2023-24 season) 02/17/2022    Colorectal cancer screening: No longer required.   Mammogram status: Completed 04/11/2021. Repeat every year  Bone Density status: Completed 03/14/2020.   Lung Cancer Screening: (Low Dose CT Chest recommended if Age 76-80ears, 30 pack-year currently smoking OR have quit w/in 15years.) does not qualify.   Lung Cancer Screening Referral: no  Additional Screening:  Hepatitis C Screening: does qualify;   Vision Screening: Recommended annual ophthalmology exams for early detection of glaucoma and other disorders of the eye. Is the patient up to date with their annual eye exam?  Yes  Who is the provider or what is the name of the office in which the patient attends annual eye exams? HeHerbert Deanerf pt is not established with a provider, would they like to be referred to a  provider to establish care? No .   Dental Screening: Recommended annual dental exams for proper oral hygiene  Community Resource Referral / Chronic Care Management: CRR required this visit?  No   CCM required this visit?  No      Plan:     I have personally reviewed and noted the following in the patient's chart:   Medical and social history Use of alcohol, tobacco or illicit drugs  Current medications and supplements including opioid prescriptions. Patient is not currently taking opioid prescriptions. Functional ability and status Nutritional status Physical activity Advanced directives List of other physicians Hospitalizations, surgeries, and ER visits in  previous 12 months Vitals Screenings to include cognitive, depression, and falls Referrals and appointments  In addition, I have reviewed and discussed with patient certain preventive protocols, quality metrics, and best practice recommendations. A written personalized care plan for preventive services as well as general preventive health recommendations were provided to patient.     Kellie Simmering, LPN   [id number]/54/[id number]   Nurse Notes: none   Due to this being a virtual visit, the after visit summary with patients personalized plan was offered to patient via mail or my-chart. Patient would like to access on my-chart

## 2022-03-04 ENCOUNTER — Other Ambulatory Visit (HOSPITAL_BASED_OUTPATIENT_CLINIC_OR_DEPARTMENT_OTHER): Payer: Self-pay

## 2022-03-05 ENCOUNTER — Encounter: Payer: Self-pay | Admitting: Cardiothoracic Surgery

## 2022-03-07 ENCOUNTER — Other Ambulatory Visit: Payer: Self-pay | Admitting: Physician Assistant

## 2022-03-13 ENCOUNTER — Other Ambulatory Visit: Payer: Self-pay | Admitting: Physician Assistant

## 2022-03-21 ENCOUNTER — Telehealth (HOSPITAL_COMMUNITY): Payer: Self-pay

## 2022-03-21 ENCOUNTER — Ambulatory Visit (INDEPENDENT_AMBULATORY_CARE_PROVIDER_SITE_OTHER): Payer: Self-pay | Admitting: Cardiothoracic Surgery

## 2022-03-21 ENCOUNTER — Encounter: Payer: Self-pay | Admitting: Cardiothoracic Surgery

## 2022-03-21 VITALS — BP 148/72 | HR 76 | Resp 20 | Ht 62.0 in | Wt 132.0 lb

## 2022-03-21 DIAGNOSIS — Z951 Presence of aortocoronary bypass graft: Secondary | ICD-10-CM

## 2022-03-21 NOTE — Progress Notes (Signed)
Doing reasonably s/p CAB / LAA clip 01/04/22   Doing more and more around the home  Feeling well  Incision fully healed c/d/I  Plan: Continue with cardiology. No further CT surgery followup needed.

## 2022-03-24 NOTE — Progress Notes (Signed)
Doing reasonably s/p CAB / LAA clip 01/04/22    Doing more and more around the home   Feeling better, not completely back to normal  Has seen ENT re hearing in R ear, which overall appears to be improving    Incision fully healed c/d/I   Plan: One more visit with me to ensure full recovery, then no further CT surgery followup needed.

## 2022-03-26 ENCOUNTER — Encounter (HOSPITAL_COMMUNITY)
Admission: RE | Admit: 2022-03-26 | Discharge: 2022-03-26 | Disposition: A | Discharge status: Home / Self Care | Payer: Medicare Other | Source: Ambulatory Visit | Attending: Cardiovascular Disease | Admitting: Cardiovascular Disease

## 2022-03-26 ENCOUNTER — Encounter (HOSPITAL_COMMUNITY): Payer: Self-pay

## 2022-03-26 VITALS — BP 138/72 | HR 78 | Ht 63.5 in | Wt 134.9 lb

## 2022-03-26 DIAGNOSIS — Z951 Presence of aortocoronary bypass graft: Secondary | ICD-10-CM

## 2022-03-26 NOTE — Progress Notes (Signed)
Cardiac Rehab Medication Review by a Nurse  Does the patient  feel that his/her medications are working for him/her?  yes  Has the patient been experiencing any side effects to the medications prescribed?  no  Does the patient measure his/her own blood pressure or blood glucose at home?  yes   Does the patient have any problems obtaining medications due to transportation or finances?   no  Understanding of regimen: excellent Understanding of indications: excellent Potential of compliance: excellent    Nurse comments: Bethannie takes her medications as prescribed and checks her blood pressure once a week.    Christa See [facility] RN 03/26/2022 9:37 AM

## 2022-03-26 NOTE — Progress Notes (Signed)
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 USE 1 VIAL VIA NEBULIZER EVERY 6 HOURS Strength: (2.5 MG/3ML) 0.083%, Disp: 150 mL, Rfl: 5   amiodarone (PACERONE) 200 MG tablet, Take 1 tablet (200 mg total) by mouth 2 (two) times daily. For 14 days then reduce the dose to 1 tablet or '200mg'$  once daily., Disp: 60 tablet, Rfl: 1   amLODipine (NORVASC) 5 MG tablet, Take 1 tablet (5 mg total) by mouth daily., Disp: 90 tablet, Rfl: 1   aspirin EC 325 MG tablet, Take 1 tablet (325 mg total) by mouth daily., Disp: , Rfl:    BREZTRI AEROSPHERE 160-9-4.8 MCG/ACT AERO, INHALE 2 PUFFS INTO THE LUNGS IN THE MORNING AND AT BEDTIME., Disp: 32.1 each, Rfl: 2   Calcium Carb-Cholecalciferol (CALCIUM 600 + D PO), Take 1 tablet by mouth daily., Disp: , Rfl:    cetirizine (ZYRTEC) 10 MG tablet, Take 10 mg by mouth daily as needed for allergies., Disp: , Rfl:    docusate sodium (COLACE) 100 MG capsule, Take 100 mg by mouth daily. , Disp: , Rfl:    empagliflozin ([name]) 10 MG TABS tablet, Take 1 tablet (10 mg total) by mouth daily before breakfast., Disp: 90  tablet, Rfl: 3   Evolocumab (REPATHA SURECLICK) 179 MG/ML SOAJ, Inject 140 mg into the skin every 14 (fourteen) days., Disp: 2 mL, Rfl: 6   metoprolol tartrate (LOPRESSOR) 25 MG tablet, Take 1 tablet (25 mg total) by mouth 2 (two) times daily., Disp: 180 tablet, Rfl: 1   mometasone (NASONEX) 50 MCG/ACT nasal spray, SHAKE LIQUID AND USE 2 SPRAYS IN EACH NOSTRIL DAILY, Disp: 17 each, Rfl: 0   Multiple Vitamins-Minerals (MULTIVITAL PO), Take 1 tablet by mouth daily. , Disp: , Rfl:    pantoprazole (PROTONIX) 40 MG tablet, TAKE 1 TABLET BY MOUTH EVERY DAY, Disp: 30 tablet, Rfl: 3   Probiotic Product (PROBIOTIC DAILY PO), Take 1 tablet by mouth daily. , Disp: , Rfl:    rosuvastatin (CRESTOR) 40 MG tablet, TAKE 1 TABLET BY MOUTH EVERY DAY, Disp: 90 tablet, Rfl: 3   sodium chloride (OCEAN) 0.65 % SOLN nasal spray, Place 1 spray into both nostrils as needed for congestion., Disp: , Rfl:   Past Medical History: Past Medical History:  Diagnosis Date   Aortic valve sclerosis 09/25/2021   Arthritis    Asthma    CAD in native artery 08/15/2020   Chickenpox    Colon polyps 2010   Epistaxis 03/29/2021   Heart murmur    History of fainting spells of unknown cause    Hyperlipidemia    Hypertension    Renal  artery stenosis ([place]) 09/25/2021   Sarcoid 06/16/1976    Tobacco Use: Social History   Tobacco Use  Smoking Status Never   Passive exposure: Past  Smokeless Tobacco Never    Labs: Review Flowsheet  More data exists      Latest Ref Rng & Units 01/29/2021 07/31/2021 01/03/2022 01/04/2022 01/05/2022  Labs for ITP Cardiac and Pulmonary Rehab  Cholestrol 0 - 200 mg/dL 177  171  141  - -  LDL (calc) 0 - 99 mg/dL 74  90  76  - -  HDL-C >40 mg/dL 69.80  61.10  49  - -  Trlycerides <150 mg/dL 164.0  99.0  82  - -  PH, Arterial 7.35 - 7.45 - - - 7.323  7.320  7.432  7.430  7.457  7.515  7.558  7.504  7.396  7.316   PCO2 arterial 32 - 48 mmHg - - - 39.3  42.2  34.9  37.0  35.1  31.1  28.0  33.4  42.7   43.4   Bicarbonate 20.0 - 28.0 mmol/L - - - 20.3  22.1  23.3  24.6  24.8  25.1  25.0  26.3  26.2  22.0   TCO2 22 - 32 mmol/L - - - '21  23  23  24  26  25  26  24  26  26  26  27  27  25  28  23   '$ Acid-base deficit 0.0 - 2.0 mmol/L - - - 5.0  4.0  1.0  4.0   O2 Saturation % - - - 98  56.6  99  100  100  100  100  100  100  100  69.7  70.6  97     Capillary Blood Glucose: Lab Results  Component Value Date   GLUCAP 99 01/12/2022   GLUCAP 96 01/12/2022   GLUCAP 147 (H) 01/11/2022   GLUCAP 119 (H) 01/11/2022   GLUCAP 111 (H) 01/11/2022     Exercise Target Goals: Exercise Program Goal: Individual exercise prescription set using results from initial 6 min walk test and THRR while considering  patient's activity barriers and safety.   Exercise Prescription Goal: Initial exercise prescription builds to 30-45 minutes a day of aerobic activity, 2-3 days per week.  Home exercise guidelines will be given to patient during program as part of exercise prescription that the participant will acknowledge.  Activity Barriers & Risk Stratification:  Activity Barriers & Cardiac Risk Stratification - 03/26/22 1026       Activity Barriers & Cardiac Risk Stratification   Activity Barriers Arthritis;History of Falls;Balance Concerns;Assistive Device;Incisional Pain;Shortness of Breath    Cardiac Risk Stratification High             6 Minute Walk:  6 Minute Walk     Row Name 03/26/22 1011         6 Minute Walk   Phase Initial     Distance 954 feet     Walk Time 6 minutes     # of Rest Breaks 0     MPH 1.81     METS 2.26     RPE 13     Perceived Dyspnea  1     VO2 Peak 7.92     Symptoms Yes (comment)     Comments Patient c/o mild SOB, RPD=1.     Resting HR 78 bpm     Resting BP 138/72     Resting Oxygen Saturation  98 %  Exercise Oxygen Saturation  during 6 min walk 93 %     Max Ex. HR 96 bpm     Max Ex. BP 158/72     2 Minute Post BP 128/82              Oxygen  Initial Assessment:   Oxygen Re-Evaluation:   Oxygen Discharge (Final Oxygen Re-Evaluation):   Initial Exercise Prescription:  Initial Exercise Prescription - 03/26/22 1100       Date of Initial Exercise RX and Referring Provider   Date 03/26/22    Referring Provider Skeet Latch, MD    Expected Discharge Date 05/24/22      Recumbant Bike   Level 1    Minutes 15    METs 1.9      NuStep   Level 1    SPM 85    Minutes 15    METs 1.9      Prescription Details   Frequency (times per week) 3    Duration Progress to 30 minutes of continuous aerobic without signs/symptoms of physical distress      Intensity   THRR 40-80% of Max Heartrate 58-115    Ratings of Perceived Exertion 11-13    Perceived Dyspnea 0-4      Progression   Progression Continue to progress workloads to maintain intensity without signs/symptoms of physical distress.      Resistance Training   Training Prescription Yes    Weight 2 lbs    Reps 10-15             Perform Capillary Blood Glucose checks as needed.  Exercise Prescription Changes:   Exercise Comments:   Exercise Goals and Review:   Exercise Goals     Row Name 03/26/22 1026             Exercise Goals   Increase Physical Activity Yes       Intervention Provide advice, education, support and counseling about physical activity/exercise needs.;Develop an individualized exercise prescription for aerobic and resistive training based on initial evaluation findings, risk stratification, comorbidities and participant's personal goals.       Expected Outcomes Short Term: Attend rehab on a regular basis to increase amount of physical activity.;Long Term: Exercising regularly at least 3-5 days a week.;Long Term: Add in home exercise to make exercise part of routine and to increase amount of physical activity.       Increase Strength and Stamina Yes       Intervention Provide advice, education, support and counseling about physical  activity/exercise needs.;Develop an individualized exercise prescription for aerobic and resistive training based on initial evaluation findings, risk stratification, comorbidities and participant's personal goals.       Expected Outcomes Short Term: Increase workloads from initial exercise prescription for resistance, speed, and METs.;Short Term: Perform resistance training exercises routinely during rehab and add in resistance training at home;Long Term: Improve cardiorespiratory fitness, muscular endurance and strength as measured by increased METs and functional capacity (6MWT)       Able to understand and use rate of perceived exertion (RPE) scale Yes       Intervention Provide education and explanation on how to use RPE scale       Expected Outcomes Short Term: Able to use RPE daily in rehab to express subjective intensity level;Long Term:  Able to use RPE to guide intensity level when exercising independently       Knowledge and understanding of Target Heart Rate Range (THRR) Yes  Intervention Provide education and explanation of THRR including how the numbers were predicted and where they are located for reference       Expected Outcomes Short Term: Able to state/look up THRR;Long Term: Able to use THRR to govern intensity when exercising independently;Short Term: Able to use daily as guideline for intensity in rehab       Able to check pulse independently Yes       Intervention Provide education and demonstration on how to check pulse in carotid and radial arteries.;Review the importance of being able to check your own pulse for safety during independent exercise       Expected Outcomes Short Term: Able to explain why pulse checking is important during independent exercise;Long Term: Able to check pulse independently and accurately       Understanding of Exercise Prescription Yes       Intervention Provide education, explanation, and written materials on patient's individual exercise  prescription       Expected Outcomes Short Term: Able to explain program exercise prescription;Long Term: Able to explain home exercise prescription to exercise independently                Exercise Goals Re-Evaluation :   Discharge Exercise Prescription (Final Exercise Prescription Changes):   Nutrition:  Target Goals: Understanding of nutrition guidelines, daily intake of sodium '1500mg'$ , cholesterol '200mg'$ , calories 30% from fat and 7% or less from saturated fats, daily to have 5 or more servings of fruits and vegetables.  Biometrics:  Pre Biometrics - 03/26/22 0922       Pre Biometrics   Waist Circumference 36 inches    Hip Circumference 38 inches    Waist to Hip Ratio 0.95 %    Triceps Skinfold 17 mm    % Body Fat 35.3 %    Grip Strength 16 kg    Flexibility 0 in    Single Leg Stand 13.93 seconds              Nutrition Therapy Plan and Nutrition Goals:   Nutrition Assessments:  Nutrition Assessments - 03/26/22 1136       Rate Your Plate Scores   Pre Score 76            MEDIFICTS Score Key: ?70 Need to make dietary changes  40-70 Heart Healthy Diet ? 40 Therapeutic Level Cholesterol Diet   Flowsheet Row INTENSIVE CARDIAC REHAB ORIENT from 03/26/2022 in [facility] for Heart, Vascular, & Lung Health  Picture Your Plate Total Score on Admission 76      Picture Your Plate Scores: <76 Unhealthy dietary pattern with much room for improvement. 41-50 Dietary pattern unlikely to meet recommendations for good health and room for improvement. 51-60 More healthful dietary pattern, with some room for improvement.  >60 Healthy dietary pattern, although there may be some specific behaviors that could be improved.    Nutrition Goals Re-Evaluation:   Nutrition Goals Re-Evaluation:   Nutrition Goals Discharge (Final Nutrition Goals Re-Evaluation):   Psychosocial: Target Goals: Acknowledge presence or absence of significant  depression and/or stress, maximize coping skills, provide positive support system. Participant is able to verbalize types and ability to use techniques and skills needed for reducing stress and depression.  Initial Review & Psychosocial Screening:  Initial Psych Review & Screening - 03/26/22 1250       Initial Review   Current issues with Current Anxiety/Panic      Family Dynamics   Good Support System? Yes  Sivan has her husband and 3 children for support   Comments Khalani said that she experienced a lot of anxiety after surgery and reports its getting better now.      Barriers   Psychosocial barriers to participate in program The patient should benefit from training in stress management and relaxation.      Screening Interventions   Interventions Encouraged to exercise    Expected Outcomes Long Term Goal: Stressors or current issues are controlled or eliminated.             Quality of Life Scores:  Quality of Life - 03/26/22 1138       Quality of Life   Select Quality of Life      Quality of Life Scores   Health/Function Pre 23.88 %    Socioeconomic Pre 28.57 %    Psych/Spiritual Pre 28.29 %    Family Pre 30 %    GLOBAL Pre 26.83 %            Scores of 19 and below usually indicate a poorer quality of life in these areas.  A difference of  2-3 points is a clinically meaningful difference.  A difference of 2-3 points in the total score of the Quality of Life Index has been associated with significant improvement in overall quality of life, self-image, physical symptoms, and general health in studies assessing change in quality of life.  PHQ-9: Review Flowsheet  More data exists      03/26/2022 02/25/2022 02/13/2021 01/29/2021 08/15/2020  Depression screen PHQ 2/9  Decreased Interest 0 0 0 0 1 0  Down, Depressed, Hopeless 0 0 0 0 0 0  PHQ - 2 Score 0 0 0 0 1 0  Altered sleeping 2 - - 2 -  Tired, decreased energy 2 - - 2 -  Change in appetite 0 - - 0 -   Feeling bad or failure about yourself  0 - - 0 -  Trouble concentrating 2 - - 0 -  Moving slowly or fidgety/restless 0 - - 1 -  Suicidal thoughts 0 - - 0 -  PHQ-9 Score 6 - - 6 -  Difficult doing work/chores Somewhat difficult - - Not difficult at all -   Interpretation of Total Score  Total Score Depression Severity:  1-4 = Minimal depression, 5-9 = Mild depression, 10-14 = Moderate depression, 15-19 = Moderately severe depression, 20-27 = Severe depression   Psychosocial Evaluation and Intervention:   Psychosocial Re-Evaluation:   Psychosocial Discharge (Final Psychosocial Re-Evaluation):   Vocational Rehabilitation: Provide vocational rehab assistance to qualifying candidates.   Vocational Rehab Evaluation & Intervention:  Vocational Rehab - 03/26/22 1254       Initial Vocational Rehab Evaluation & Intervention   Assessment shows need for Vocational Rehabilitation No   Honesty is retired and does not need vocational rehab at this time            Education: Education Goals: Education classes will be provided on a weekly basis, covering required topics. Participant will state understanding/return demonstration of topics presented.     Core Videos: Exercise    Move It!  Clinical staff conducted group or individual video education with verbal and written material and guidebook.  Patient learns the recommended Pritikin exercise program. Exercise with the goal of living a long, healthy life. Some of the health benefits of exercise include controlled diabetes, healthier blood pressure levels, improved cholesterol levels, improved heart and lung capacity, improved sleep, and better body composition.  Everyone should speak with their doctor before starting or changing an exercise routine.  Biomechanical Limitations Clinical staff conducted group or individual video education with verbal and written material and guidebook.  Patient learns how biomechanical limitations can  impact exercise and how we can mitigate and possibly overcome limitations to have an impactful and balanced exercise routine.  Body Composition Clinical staff conducted group or individual video education with verbal and written material and guidebook.  Patient learns that body composition (ratio of muscle mass to fat mass) is a key component to assessing overall fitness, rather than body weight alone. Increased fat mass, especially visceral belly fat, can put [place] at increased risk for metabolic syndrome, type 2 diabetes, heart disease, and even death. It is recommended to combine diet and exercise (cardiovascular and resistance training) to improve your body composition. Seek guidance from your physician and exercise physiologist before implementing an exercise routine.  Exercise Action Plan Clinical staff conducted group or individual video education with verbal and written material and guidebook.  Patient learns the recommended strategies to achieve and enjoy long-term exercise adherence, including variety, self-motivation, self-efficacy, and positive decision making. Benefits of exercise include fitness, good health, weight management, more energy, better sleep, less stress, and overall well-being.  Medical   Heart Disease Risk Reduction Clinical staff conducted group or individual video education with verbal and written material and guidebook.  Patient learns our heart is our most vital organ as it circulates oxygen, nutrients, white blood cells, and hormones throughout the entire body, and carries waste away. Data supports a plant-based eating plan like the Pritikin Program for its effectiveness in slowing progression of and reversing heart disease. The video provides a number of recommendations to address heart disease.   Metabolic Syndrome and Belly Fat  Clinical staff conducted group or individual video education with verbal and written material and guidebook.  Patient learns what metabolic  syndrome is, how it leads to heart disease, and how one can reverse it and keep it from coming back. You have metabolic syndrome if you have 3 of the following 5 criteria: abdominal obesity, high blood pressure, high triglycerides, low HDL cholesterol, and high blood sugar.  Hypertension and Heart Disease Clinical staff conducted group or individual video education with verbal and written material and guidebook.  Patient learns that high blood pressure, or hypertension, is very common in the [place]. Hypertension is largely due to excessive salt intake, but other important risk factors include being overweight, physical inactivity, drinking too much alcohol, smoking, and not eating enough potassium from fruits and vegetables. High blood pressure is a leading risk factor for heart attack, stroke, congestive heart failure, dementia, kidney failure, and premature death. Long-term effects of excessive salt intake include stiffening of the arteries and thickening of heart muscle and organ damage. Recommendations include ways to reduce hypertension and the risk of heart disease.  Diseases of Our Time - Focusing on Diabetes Clinical staff conducted group or individual video education with verbal and written material and guidebook.  Patient learns why the best way to stop diseases of our time is prevention, through food and other lifestyle changes. Medicine (such as prescription pills and surgeries) is often only a Band-Aid on the problem, not a long-term solution. Most common diseases of our time include obesity, type 2 diabetes, hypertension, heart disease, and cancer. The Pritikin Program is recommended and has been proven to help reduce, reverse, and/or prevent the damaging effects of metabolic syndrome.  Nutrition   Overview of  the Pritikin Eating Plan  Clinical staff conducted group or individual video education with verbal and written material and guidebook.  Patient learns about the [place] for disease risk reduction. The [place] emphasizes a wide variety of unrefined, minimally-processed carbohydrates, like fruits, vegetables, whole grains, and legumes. Go, Caution, and Stop food choices are explained. Plant-based and lean animal proteins are emphasized. Rationale provided for low sodium intake for blood pressure control, low added sugars for blood sugar stabilization, and low added fats and oils for coronary artery disease risk reduction and weight management.  Calorie Density  Clinical staff conducted group or individual video education with verbal and written material and guidebook.  Patient learns about calorie density and how it impacts the Pritikin Eating Plan. Knowing the characteristics of the food you choose will help you decide whether those foods will lead to weight gain or weight loss, and whether you want to consume more or less of them. Weight loss is usually a side effect of the Pritikin Eating Plan because of its focus on low calorie-dense foods.  Label Reading  Clinical staff conducted group or individual video education with verbal and written material and guidebook.  Patient learns about the Pritikin recommended label reading guidelines and corresponding recommendations regarding calorie density, added sugars, sodium content, and whole grains.  Dining Out - Part 1  Clinical staff conducted group or individual video education with verbal and written material and guidebook.  Patient learns that restaurant meals can be sabotaging because they can be so high in calories, fat, sodium, and/or sugar. Patient learns recommended strategies on how to positively address this and avoid unhealthy pitfalls.  Facts on Fats  Clinical staff conducted group or individual video education with verbal and written material and guidebook.  Patient learns that lifestyle modifications can be just as effective, if not more so, as many medications for lowering your  risk of heart disease. A Pritikin lifestyle can help to reduce your risk of inflammation and atherosclerosis (cholesterol build-up, or plaque, in the artery walls). Lifestyle interventions such as dietary choices and physical activity address the cause of atherosclerosis. A review of the types of fats and their impact on blood cholesterol levels, along with dietary recommendations to reduce fat intake is also included.  Nutrition Action Plan  Clinical staff conducted group or individual video education with verbal and written material and guidebook.  Patient learns how to incorporate Pritikin recommendations into their lifestyle. Recommendations include planning and keeping personal health goals in mind as an important part of their success.  Healthy Mind-Set    Healthy Minds, Bodies, Hearts  Clinical staff conducted group or individual video education with verbal and written material and guidebook.  Patient learns how to identify when they are stressed. Video will discuss the impact of that stress, as well as the many benefits of stress management. Patient will also be introduced to stress management techniques. The way we think, act, and feel has an impact on our hearts.  How Our Thoughts Can Heal Our Hearts  Clinical staff conducted group or individual video education with verbal and written material and guidebook.  Patient learns that negative thoughts can cause depression and anxiety. This can result in negative lifestyle behavior and serious health problems. Cognitive behavioral therapy is an effective method to help control our thoughts in order to change and improve our emotional outlook.  Additional Videos:  Exercise    Improving Performance  Clinical staff conducted group or individual video education  with verbal and written material and guidebook.  Patient learns to use a non-linear approach by alternating intensity levels and lengths of time spent exercising to help burn more calories  and lose more body fat. Cardiovascular exercise helps improve heart health, metabolism, hormonal balance, blood sugar control, and recovery from fatigue. Resistance training improves strength, endurance, balance, coordination, reaction time, metabolism, and muscle mass. Flexibility exercise improves circulation, posture, and balance. Seek guidance from your physician and exercise physiologist before implementing an exercise routine and learn your capabilities and proper form for all exercise.  Introduction to Yoga  Clinical staff conducted group or individual video education with verbal and written material and guidebook.  Patient learns about yoga, a discipline of the coming together of mind, breath, and body. The benefits of yoga include improved flexibility, improved range of motion, better posture and core strength, increased lung function, weight loss, and positive self-image. Yoga's heart health benefits include lowered blood pressure, healthier heart rate, decreased cholesterol and triglyceride levels, improved immune function, and reduced stress. Seek guidance from your physician and exercise physiologist before implementing an exercise routine and learn your capabilities and proper form for all exercise.  Medical   Aging: Enhancing Your Quality of Life  Clinical staff conducted group or individual video education with verbal and written material and guidebook.  Patient learns key strategies and recommendations to stay in good physical health and enhance quality of life, such as prevention strategies, having an advocate, securing a [place], and keeping a list of medications and system for tracking them. It also discusses how to avoid risk for bone loss.  Biology of Weight Control  Clinical staff conducted group or individual video education with verbal and written material and guidebook.  Patient learns that weight gain occurs because we consume more calories than  we burn (eating more, moving less). Even if your body weight is normal, you may have higher ratios of fat compared to muscle mass. Too much body fat puts you at increased risk for cardiovascular disease, heart attack, stroke, type 2 diabetes, and obesity-related cancers. In addition to exercise, following the [place] can help reduce your risk.  Decoding Lab Results  Clinical staff conducted group or individual video education with verbal and written material and guidebook.  Patient learns that lab test reflects one measurement whose values change over time and are influenced by many factors, including medication, stress, sleep, exercise, food, hydration, pre-existing medical conditions, and more. It is recommended to use the knowledge from this video to become more involved with your lab results and evaluate your numbers to speak with your doctor.   Diseases of Our Time - Overview  Clinical staff conducted group or individual video education with verbal and written material and guidebook.  Patient learns that according to the CDC, 50% to 70% of chronic diseases (such as obesity, type 2 diabetes, elevated lipids, hypertension, and heart disease) are avoidable through lifestyle improvements including healthier food choices, listening to satiety cues, and increased physical activity.  Sleep Disorders Clinical staff conducted group or individual video education with verbal and written material and guidebook.  Patient learns how good quality and duration of sleep are important to overall health and well-being. Patient also learns about sleep disorders and how they impact health along with recommendations to address them, including discussing with a physician.  Nutrition  Dining Out - Part 2 Clinical staff conducted group or individual video education with verbal and written material and  guidebook.  Patient learns how to plan ahead and communicate in order to maximize their dining experience  in a healthy and nutritious manner. Included are recommended food choices based on the type of restaurant the patient is visiting.   Fueling a Best boy conducted group or individual video education with verbal and written material and guidebook.  There is a strong connection between our food choices and our health. Diseases like obesity and type 2 diabetes are very prevalent and are in large-part due to lifestyle choices. The Pritikin Eating Plan provides plenty of food and hunger-curbing satisfaction. It is easy to follow, affordable, and helps reduce health risks.  Menu Workshop  Clinical staff conducted group or individual video education with verbal and written material and guidebook.  Patient learns that restaurant meals can sabotage health goals because they are often packed with calories, fat, sodium, and sugar. Recommendations include strategies to plan ahead and to communicate with the manager, chef, or server to help order a healthier meal.  Planning Your Eating Strategy  Clinical staff conducted group or individual video education with verbal and written material and guidebook.  Patient learns about the [place] and its benefit of reducing the risk of disease. The [place] does not focus on calories. Instead, it emphasizes high-quality, nutrient-rich foods. By knowing the characteristics of the foods, we choose, we can determine their calorie density and make informed decisions.  Targeting Your Nutrition Priorities  Clinical staff conducted group or individual video education with verbal and written material and guidebook.  Patient learns that lifestyle habits have a tremendous impact on disease risk and progression. This video provides eating and physical activity recommendations based on your personal health goals, such as reducing LDL cholesterol, losing weight, preventing or controlling type 2 diabetes, and reducing high blood  pressure.  Vitamins and Minerals  Clinical staff conducted group or individual video education with verbal and written material and guidebook.  Patient learns different ways to obtain key vitamins and minerals, including through a recommended healthy diet. It is important to discuss all supplements you take with your doctor.   Healthy Mind-Set    Smoking Cessation  Clinical staff conducted group or individual video education with verbal and written material and guidebook.  Patient learns that cigarette smoking and tobacco addiction pose a serious health risk which affects millions of people. Stopping smoking will significantly reduce the risk of heart disease, lung disease, and many forms of cancer. Recommended strategies for quitting are covered, including working with your doctor to develop a successful plan.  Culinary   Becoming a Financial trader conducted group or individual video education with verbal and written material and guidebook.  Patient learns that cooking at home can be healthy, cost-effective, quick, and puts them in control. Keys to cooking healthy recipes will include looking at your recipe, assessing your equipment needs, planning ahead, making it simple, choosing cost-effective seasonal ingredients, and limiting the use of added fats, salts, and sugars.  Cooking - Breakfast and Snacks  Clinical staff conducted group or individual video education with verbal and written material and guidebook.  Patient learns how important breakfast is to satiety and nutrition through the entire day. Recommendations include key foods to eat during breakfast to help stabilize blood sugar levels and to prevent overeating at meals later in the day. Planning ahead is also a key component.  Cooking - Human resources officer conducted group or individual video education  with verbal and written material and guidebook.  Patient learns eating strategies to improve overall  health, including an approach to cook more at home. Recommendations include thinking of animal protein as a side on your plate rather than center stage and focusing instead on lower calorie dense options like vegetables, fruits, whole grains, and plant-based proteins, such as beans. Making sauces in large quantities to freeze for later and leaving the skin on your vegetables are also recommended to maximize your experience.  Cooking - Healthy Salads and Dressing Clinical staff conducted group or individual video education with verbal and written material and guidebook.  Patient learns that vegetables, fruits, whole grains, and legumes are the foundations of the [place]. Recommendations include how to incorporate each of these in flavorful and healthy salads, and how to create homemade salad dressings. Proper handling of ingredients is also covered. Cooking - Soups and [employer] - Soups and Desserts Clinical staff conducted group or individual video education with verbal and written material and guidebook.  Patient learns that Pritikin soups and desserts make for easy, nutritious, and delicious snacks and meal components that are low in sodium, fat, sugar, and calorie density, while high in vitamins, minerals, and filling fiber. Recommendations include simple and healthy ideas for soups and desserts.   Overview     The Pritikin Solution Program Overview Clinical staff conducted group or individual video education with verbal and written material and guidebook.  Patient learns that the results of the [place] Program have been documented in more than 100 articles published in peer-reviewed journals, and the benefits include reducing risk factors for (and, in some cases, even reversing) high cholesterol, high blood pressure, type 2 diabetes, obesity, and more! An overview of the three key pillars of the Pritikin Program will be covered: eating well, doing regular exercise, and having a  healthy mind-set.  WORKSHOPS  Exercise: Exercise Basics: Building Your Action Plan Clinical staff led group instruction and group discussion with PowerPoint presentation and patient guidebook. To enhance the learning environment the use of posters, models and videos may be added. At the conclusion of this workshop, patients will comprehend the difference between physical activity and exercise, as well as the benefits of incorporating both, into their routine. Patients will understand the FITT (Frequency, Intensity, Time, and Type) principle and how to use it to build an exercise action plan. In addition, safety concerns and other considerations for exercise and cardiac rehab will be addressed by the presenter. The purpose of this lesson is to promote a comprehensive and effective weekly exercise routine in order to improve patients' overall level of fitness.   Managing Heart Disease: Your Path to a Healthier Heart Clinical staff led group instruction and group discussion with PowerPoint presentation and patient guidebook. To enhance the learning environment the use of posters, models and videos may be added.At the conclusion of this workshop, patients will understand the anatomy and physiology of the heart. Additionally, they will understand how Pritikin's three pillars impact the risk factors, the progression, and the management of heart disease.  The purpose of this lesson is to provide a high-level overview of the heart, heart disease, and how the Pritikin lifestyle positively impacts risk factors.  Exercise Biomechanics Clinical staff led group instruction and group discussion with PowerPoint presentation and patient guidebook. To enhance the learning environment the use of posters, models and videos may be added. Patients will learn how the structural parts of their bodies function and how these functions impact  their daily activities, movement, and exercise. Patients will learn how to  promote a neutral spine, learn how to manage pain, and identify ways to improve their physical movement in order to promote healthy living. The purpose of this lesson is to expose patients to common physical limitations that impact physical activity. Participants will learn practical ways to adapt and manage aches and pains, and to minimize their effect on regular exercise. Patients will learn how to maintain good posture while sitting, walking, and lifting.  Balance Training and Fall Prevention  Clinical staff led group instruction and group discussion with PowerPoint presentation and patient guidebook. To enhance the learning environment the use of posters, models and videos may be added. At the conclusion of this workshop, patients will understand the importance of their sensorimotor skills (vision, proprioception, and the vestibular system) in maintaining their ability to balance as they age. Patients will apply a variety of balancing exercises that are appropriate for their current level of function. Patients will understand the common causes for poor balance, possible solutions to these problems, and ways to modify their physical environment in order to minimize their fall risk. The purpose of this lesson is to teach patients about the importance of maintaining balance as they age and ways to minimize their risk of falling.  WORKSHOPS   Nutrition:  Fueling a Scientist, research (physical sciences) led group instruction and group discussion with PowerPoint presentation and patient guidebook. To enhance the learning environment the use of posters, models and videos may be added. Patients will review the foundational principles of the [place] and understand what constitutes a serving size in each of the food groups. Patients will also learn Pritikin-friendly foods that are better choices when away from home and review make-ahead meal and snack options. Calorie density will be reviewed and  applied to three nutrition priorities: weight maintenance, weight loss, and weight gain. The purpose of this lesson is to reinforce (in a group setting) the key concepts around what patients are recommended to eat and how to apply these guidelines when away from home by planning and selecting Pritikin-friendly options. Patients will understand how calorie density may be adjusted for different weight management goals.  Mindful Eating  Clinical staff led group instruction and group discussion with PowerPoint presentation and patient guidebook. To enhance the learning environment the use of posters, models and videos may be added. Patients will briefly review the concepts of the [place] and the importance of low-calorie dense foods. The concept of mindful eating will be introduced as well as the importance of paying attention to internal hunger signals. Triggers for non-hunger eating and techniques for dealing with triggers will be explored. The purpose of this lesson is to provide patients with the opportunity to review the basic principles of the [place], discuss the value of eating mindfully and how to measure internal cues of hunger and fullness using the Hunger Scale. Patients will also discuss reasons for non-hunger eating and learn strategies to use for controlling emotional eating.  Targeting Your Nutrition Priorities Clinical staff led group instruction and group discussion with PowerPoint presentation and patient guidebook. To enhance the learning environment the use of posters, models and videos may be added. Patients will learn how to determine their genetic susceptibility to disease by reviewing their family history. Patients will gain insight into the importance of diet as part of an overall healthy lifestyle in mitigating the impact of genetics and other environmental insults. The purpose of this  lesson is to provide patients with the opportunity to assess their personal  nutrition priorities by looking at their family history, their own health history and current risk factors. Patients will also be able to discuss ways of prioritizing and modifying the [place] for their highest risk areas  Menu  Clinical staff led group instruction and group discussion with PowerPoint presentation and patient guidebook. To enhance the learning environment the use of posters, models and videos may be added. Using menus brought in from [employer], or printed from [employer], patients will apply the [place] dining out guidelines that were presented in the [employer] video. Patients will also be able to practice these guidelines in a variety of provided scenarios. The purpose of this lesson is to provide patients with the opportunity to practice hands-on learning of the [place] with actual menus and practice scenarios.  Label Reading Clinical staff led group instruction and group discussion with PowerPoint presentation and patient guidebook. To enhance the learning environment the use of posters, models and videos may be added. Patients will review and discuss the Pritikin label reading guidelines presented in Pritikin's Label Reading Educational series video. Using fool labels brought in from local grocery stores and markets, patients will apply the label reading guidelines and determine if the packaged food meet the Pritikin guidelines. The purpose of this lesson is to provide patients with the opportunity to review, discuss, and practice hands-on learning of the Pritikin Label Reading guidelines with actual packaged food labels. [place] Workshops are designed to teach patients ways to prepare quick, simple, and affordable recipes at home. The importance of nutrition's role in chronic disease risk reduction is reflected in its emphasis in the overall Pritikin program. By learning how to prepare  essential core Pritikin Eating Plan recipes, patients will increase control over what they eat; be able to customize the flavor of foods without the use of added salt, sugar, or fat; and improve the quality of the food they consume. By learning a set of core recipes which are easily assembled, quickly prepared, and affordable, patients are more likely to prepare more healthy foods at home. These workshops focus on convenient breakfasts, simple entres, side dishes, and desserts which can be prepared with minimal effort and are consistent with nutrition recommendations for cardiovascular risk reduction. Cooking [employer] are taught by a Engineer, materials (RD) who has been trained by the [employer]. The chef or RD has a clear understanding of the importance of minimizing - if not completely eliminating - added fat, sugar, and sodium in recipes. Throughout the series of [place] Workshop sessions, patients will learn about healthy ingredients and efficient methods of cooking to build confidence in their capability to prepare    Cooking School weekly topics:  Adding Flavor- Sodium-Free  Fast and Healthy Breakfasts  Powerhouse Plant-Based Proteins  Satisfying Salads and Dressings  Simple Sides and Sauces  International Cuisine-Spotlight on the [place] Zones  Delicious Desserts  Savory Soups  Efficiency Cooking - Meals in a Snap  Tasty Appetizers and Snacks  Comforting Weekend Breakfasts  One-Pot Wonders   Fast Evening Meals  Easy [place] (Psychosocial): New Thoughts, New Behaviors Clinical staff led group instruction and group discussion with PowerPoint presentation and patient guidebook. To enhance the learning environment the use of posters, models and videos may be added. Patients will learn  and practice techniques for developing effective health and lifestyle goals. Patients will be able  to effectively apply the goal setting process learned to develop at least one new personal goal.  The purpose of this lesson is to expose patients to a new skill set of behavior modification techniques such as techniques setting SMART goals, overcoming barriers, and achieving new thoughts and new behaviors.  Managing Moods and Relationships Clinical staff led group instruction and group discussion with PowerPoint presentation and patient guidebook. To enhance the learning environment the use of posters, models and videos may be added. Patients will learn how emotional and chronic stress factors can impact their health and relationships. They will learn healthy ways to manage their moods and utilize positive coping mechanisms. In addition, ICR patients will learn ways to improve communication skills. The purpose of this lesson is to expose patients to ways of understanding how one's mood and health are intimately connected. Developing a healthy outlook can help build positive relationships and connections with others. Patients will understand the importance of utilizing effective communication skills that include actively listening and being heard. They will learn and understand the importance of the "4 Cs" and especially Connections in fostering of a Healthy Mind-Set.  Healthy Sleep for a Healthy Heart Clinical staff led group instruction and group discussion with PowerPoint presentation and patient guidebook. To enhance the learning environment the use of posters, models and videos may be added. At the conclusion of this workshop, patients will be able to demonstrate knowledge of the importance of sleep to overall health, well-being, and quality of life. They will understand the symptoms of, and treatments for, common sleep disorders. Patients will also be able to identify daytime and nighttime behaviors which impact sleep, and they will be able to apply these tools to help manage sleep-related challenges.  The purpose of this lesson is to provide patients with a general overview of sleep and outline the importance of quality sleep. Patients will learn about a few of the most common sleep disorders. Patients will also be introduced to the concept of "sleep hygiene," and discover ways to self-manage certain sleeping problems through simple daily behavior changes. Finally, the workshop will motivate patients by clarifying the links between quality sleep and their goals of heart-healthy living.   Recognizing and Reducing Stress Clinical staff led group instruction and group discussion with PowerPoint presentation and patient guidebook. To enhance the learning environment the use of posters, models and videos may be added. At the conclusion of this workshop, patients will be able to understand the types of stress reactions, differentiate between acute and chronic stress, and recognize the impact that chronic stress has on their health. They will also be able to apply different coping mechanisms, such as reframing negative self-talk. Patients will have the opportunity to practice a variety of stress management techniques, such as deep abdominal breathing, progressive muscle relaxation, and/or guided imagery.  The purpose of this lesson is to educate patients on the role of stress in their lives and to provide healthy techniques for coping with it.  Learning Barriers/Preferences:  Learning Barriers/Preferences - 03/26/22 1252       Learning Barriers/Preferences   Learning Barriers Sight;Hearing   wears glasses. hearing loss in right ear. Does not wear a heariing aide   Learning Preferences Audio;Group Instruction;Individual Instruction;Pictoral;Written Material;Verbal Instruction;Skilled Demonstration             Education Topics:  Knowledge Questionnaire Score:  Knowledge Questionnaire Score - 03/26/22 1139  Knowledge Questionnaire Score   Pre Score 20/24             Core  Components/Risk Factors/Patient Goals at Admission:  Personal Goals and Risk Factors at Admission - 03/26/22 0922       Core Components/Risk Factors/Patient Goals on Admission    Weight Management Weight Maintenance;Yes    Intervention Weight Management: Develop a combined nutrition and exercise program designed to reach desired caloric intake, while maintaining appropriate intake of nutrient and fiber, sodium and fats, and appropriate energy expenditure required for the weight goal.;Weight Management: Provide education and appropriate resources to help participant work on and attain dietary goals.    Expected Outcomes Short Term: Continue to assess and modify interventions until short term weight is achieved;Long Term: Adherence to nutrition and physical activity/exercise program aimed toward attainment of established weight goal;Weight Maintenance: Understanding of the daily nutrition guidelines, which includes 25-35% calories from fat, 7% or less cal from saturated fats, less than '200mg'$  cholesterol, less than 1.5gm of sodium, & 5 or more servings of fruits and vegetables daily;Understanding recommendations for meals to include 15-35% energy as protein, 25-35% energy from fat, 35-60% energy from carbohydrates, less than '200mg'$  of dietary cholesterol, 20-35 gm of total fiber daily    Hypertension Yes    Intervention Provide education on lifestyle modifcations including regular physical activity/exercise, weight management, moderate sodium restriction and increased consumption of fresh fruit, vegetables, and low fat dairy, alcohol moderation, and smoking cessation.;Monitor prescription use compliance.    Expected Outcomes Short Term: Continued assessment and intervention until BP is < 140/78m HG in hypertensive participants. < 130/853mHG in hypertensive participants with diabetes, heart failure or chronic kidney disease.;Long Term: Maintenance of blood pressure at goal levels.    Lipids Yes     Intervention Provide education and support for participant on nutrition & aerobic/resistive exercise along with prescribed medications to achieve LDL '70mg'$ , HDL >'40mg'$ .    Expected Outcomes Short Term: Participant states understanding of desired cholesterol values and is compliant with medications prescribed. Participant is following exercise prescription and nutrition guidelines.;Long Term: Cholesterol controlled with medications as prescribed, with individualized exercise RX and with personalized nutrition plan. Value goals: LDL < '70mg'$ , HDL > 40 mg.    Personal Goal Other Yes    Personal Goal Regain independence, not need to use cane. Be able to walk farther. Be able to perform ADLs. Get back to cooking. Have confidence to go on day excursions.    Intervention Provide individualized exercise prescription including aerobic, resistance, and stretching exercises to help increase strength and cardiorespiratory fitness, so that patient will have confidence and ability to do activities of daily living, exercise, and leisure activities.    Expected Outcomes Patient will be able to resume exercise activities, lesiure activities, and activites of daily living with confidence. Patient will be able to walk farther as measured by six-minute walk test.             Core Components/Risk Factors/Patient Goals Review:    Core Components/Risk Factors/Patient Goals at Discharge (Final Review):    ITP Comments:  ITP Comments     Row Name 03/26/22 [id number]         ITP Comments Medical Director- Dr. TrFransico HimMD, Introduction to [facility]/ Intensive Cardiac Rehab. Initial Orientation packet reviewed with the patient                Comments: Participant attended orientation for the cardiac rehabilitation program on  03/26/2022  to perform initial intake and exercise walk test. Patient introduced to the [facility] education and orientation packet was reviewed. Completed 6-minute  walk test, measurements, initial ITP, and exercise prescription. Vital signs stable. Telemetry-normal sinus rhythm, Britania reported having mild shortness of breath during the walk test otherwise asymptomatic.Harrell Gave RN BSN   Service time was from [phone] to 1130.

## 2022-03-30 ENCOUNTER — Encounter (HOSPITAL_BASED_OUTPATIENT_CLINIC_OR_DEPARTMENT_OTHER): Payer: Self-pay

## 2022-04-01 ENCOUNTER — Encounter (HOSPITAL_COMMUNITY)
Admission: RE | Admit: 2022-04-01 | Discharge: 2022-04-01 | Disposition: A | Discharge status: Home / Self Care | Payer: Medicare Other | Source: Ambulatory Visit | Attending: Cardiovascular Disease | Admitting: Cardiovascular Disease

## 2022-04-01 DIAGNOSIS — Z951 Presence of aortocoronary bypass graft: Secondary | ICD-10-CM | POA: Diagnosis not present

## 2022-04-01 NOTE — Progress Notes (Signed)
Daily Session Note  Patient Details  Name: Kristen Suarez MRN: [id number] Date of Birth: 16-Jul-1945 Referring Provider:   [place] [facility] 03/26/2022 in [facility] [place]  Referring Provider Skeet Latch, MD       Encounter Date: 04/01/2022  Check In:  Session Check In - 04/01/22 1047       Check-In   Supervising physician immediately available to respond to emergencies [facility] MD immediately available    Physician(s) Ambrose Pancoast, NP    Location [facility]    Staff Present Barnet Pall, RN, Deland Pretty, MS, ACSM-CEP, Exercise Physiologist;Bailey Pearline Cables, MS, Exercise Physiologist;David Lilyan Punt, MS, ACSM-CEP, CCRP, Exercise Physiologist;Jetta Walker BS, ACSM-CEP, Exercise Physiologist    Virtual Visit No    Medication changes reported     No    Fall or balance concerns reported    No    Tobacco Cessation No Change    Warm-up and Cool-down Performed as group-led instruction    Resistance Training Performed Yes    VAD Patient? No    PAD/SET Patient? No      Pain Assessment   Currently in Pain? No/denies    Pain Score 0-No pain    Multiple Pain Sites No             Capillary Blood Glucose: No results found for this or any previous visit (from the past 24 hour(s)).   Exercise Prescription Changes - 04/01/22 1027       Response to Exercise   Blood Pressure (Admit) 136/78    Blood Pressure (Exercise) 158/88    Blood Pressure (Exit) 128/78    Heart Rate (Admit) 81 bpm    Heart Rate (Exercise) 103 bpm    Heart Rate (Exit) 78 bpm    Oxygen Saturation (Exercise) 97 %    Oxygen Saturation (Exit) 96 %    Rating of Perceived Exertion (Exercise) 15    Perceived Dyspnea (Exercise) 3    Symptoms Patient c/o feeling lightheaded, leg fatigue, and shortness of breath.    Comments Patient only able to complete 5 minutes of the 2nd station recumbent bike due to leg  fatigue and shortness of breath.    Duration Progress to 30 minutes of  aerobic without signs/symptoms of physical distress    Intensity THRR unchanged      Progression   Progression Continue to progress workloads to maintain intensity without signs/symptoms of physical distress.    Average METs 1.7      Resistance Training   Training Prescription Yes    Weight 1 lbs    Reps 10-15    Time 10 Minutes      Interval Training   Interval Training No      Recumbant Bike   Level 1    RPM 41    Watts 12    Minutes 5    METs 1.7      NuStep   Level 1    SPM 68    Minutes 15    METs 1.7             Social History   Tobacco Use  Smoking Status Never   Passive exposure: Past  Smokeless Tobacco Never    Goals Met:  Strength training completed today  Goals Unmet:  Not Applicable  Comments: Pt started cardiac rehab today.  Pt is somewhat deconditioned. Uses a cane for stability. VSS, telemetry-Sinus Rhythm, Reported leg  fatigue, shortness of breath and reported feeling light headed 5 minutes into exercise on second station on the recumbent bike. Exercise stopped. Patient used rescue inhaler. . Blood pressure 122/70. Patient was given water. Symptoms resolved. Medication list reconciled. Pt denies barriers to medication compliance.  PSYCHOSOCIAL ASSESSMENT:  PHQ-6. Pt exhibits positive coping skills, hopeful outlook with supportive family. No psychosocial needs identified at this time, no psychosocial interventions necessary. Kristen Suarez said that she experienced a lot of anxiety immediately after surgery. Kristen Suarez reports feeling better today.   Pt enjoys reading, light exercise, crafts and cooking.   Pt oriented to exercise equipment and routine.    Understanding verbalized.Harrell Gave RN BSN    Dr. Fransico Him is Medical Director for Cardiac Rehab at [facility].

## 2022-04-03 ENCOUNTER — Encounter (HOSPITAL_COMMUNITY)
Admission: RE | Admit: 2022-04-03 | Discharge: 2022-04-03 | Disposition: A | Discharge status: Home / Self Care | Payer: Medicare Other | Source: Ambulatory Visit | Attending: Cardiovascular Disease | Admitting: Cardiovascular Disease

## 2022-04-03 DIAGNOSIS — Z951 Presence of aortocoronary bypass graft: Secondary | ICD-10-CM

## 2022-04-04 ENCOUNTER — Telehealth (HOSPITAL_BASED_OUTPATIENT_CLINIC_OR_DEPARTMENT_OTHER): Payer: Self-pay

## 2022-04-04 NOTE — Telephone Encounter (Signed)
Prior auth approved, patient notified via [employer]

## 2022-04-04 NOTE — Telephone Encounter (Signed)
Repatha prior auth,   "Your information has been submitted to [employer] [employer]. [employer] will review the request and will issue a decision, typically within 1-3 days from your submission. You can check the updated outcome later by reopening this request.  If [employer] has not responded in 1-3 days or if you have any questions about your ePA request, please contact [employer] [employer] at [phone]. If you think there may be a problem with your PA request, use our live chat feature at the bottom right."    AWAITING DETERMINATION

## 2022-04-05 ENCOUNTER — Encounter (HOSPITAL_COMMUNITY)
Admission: RE | Admit: 2022-04-05 | Discharge: 2022-04-05 | Disposition: A | Discharge status: Home / Self Care | Payer: Medicare Other | Source: Ambulatory Visit | Attending: Cardiovascular Disease | Admitting: Cardiovascular Disease

## 2022-04-05 DIAGNOSIS — Z951 Presence of aortocoronary bypass graft: Secondary | ICD-10-CM | POA: Diagnosis not present

## 2022-04-05 DIAGNOSIS — D860 Sarcoidosis of lung: Secondary | ICD-10-CM

## 2022-04-06 ENCOUNTER — Other Ambulatory Visit: Payer: Self-pay | Admitting: Nurse Practitioner

## 2022-04-06 DIAGNOSIS — R0982 Postnasal drip: Secondary | ICD-10-CM

## 2022-04-06 DIAGNOSIS — R059 Cough, unspecified: Secondary | ICD-10-CM

## 2022-04-08 ENCOUNTER — Encounter (HOSPITAL_COMMUNITY)
Admission: RE | Admit: 2022-04-08 | Discharge: 2022-04-08 | Disposition: A | Discharge status: Home / Self Care | Payer: Medicare Other | Source: Ambulatory Visit | Attending: Cardiovascular Disease | Admitting: Cardiovascular Disease

## 2022-04-08 DIAGNOSIS — Z951 Presence of aortocoronary bypass graft: Secondary | ICD-10-CM

## 2022-04-08 NOTE — Telephone Encounter (Signed)
Chart supports Rx Last OV: 01/2022 Next OV: not scheduled

## 2022-04-10 ENCOUNTER — Encounter (HOSPITAL_COMMUNITY)
Admission: RE | Admit: 2022-04-10 | Discharge: 2022-04-10 | Disposition: A | Discharge status: Home / Self Care | Payer: Medicare Other | Source: Ambulatory Visit | Attending: Cardiovascular Disease | Admitting: Cardiovascular Disease

## 2022-04-10 DIAGNOSIS — D860 Sarcoidosis of lung: Secondary | ICD-10-CM

## 2022-04-10 DIAGNOSIS — Z951 Presence of aortocoronary bypass graft: Secondary | ICD-10-CM

## 2022-04-12 ENCOUNTER — Encounter (HOSPITAL_COMMUNITY)
Admission: RE | Admit: 2022-04-12 | Discharge: 2022-04-12 | Disposition: A | Discharge status: Home / Self Care | Payer: Medicare Other | Source: Ambulatory Visit | Attending: Cardiovascular Disease | Admitting: Cardiovascular Disease

## 2022-04-12 DIAGNOSIS — Z951 Presence of aortocoronary bypass graft: Secondary | ICD-10-CM | POA: Diagnosis not present

## 2022-04-15 ENCOUNTER — Encounter (HOSPITAL_COMMUNITY)
Admission: RE | Admit: 2022-04-15 | Discharge: 2022-04-15 | Disposition: A | Discharge status: Home / Self Care | Payer: Medicare Other | Source: Ambulatory Visit | Attending: Cardiovascular Disease | Admitting: Cardiovascular Disease

## 2022-04-15 DIAGNOSIS — Z951 Presence of aortocoronary bypass graft: Secondary | ICD-10-CM | POA: Diagnosis not present

## 2022-04-16 NOTE — Progress Notes (Signed)
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 USE 1 VIAL VIA NEBULIZER EVERY 6 HOURS Strength: (2.5 MG/3ML) 0.083%, Disp: 150 mL, Rfl: 5   amiodarone (PACERONE) 200 MG tablet, Take 1 tablet (200 mg total) by mouth 2 (two) times daily. For 14 days then reduce the dose to 1 tablet or '200mg'$  once daily., Disp: 60 tablet, Rfl: 1   amLODipine (NORVASC) 5 MG tablet, Take 1 tablet (5 mg total) by mouth daily., Disp: 90 tablet, Rfl: 1   aspirin EC 325 MG tablet, Take 1 tablet (325 mg total) by mouth daily., Disp: , Rfl:    BREZTRI AEROSPHERE 160-9-4.8 MCG/ACT AERO, INHALE 2 PUFFS INTO THE LUNGS IN THE MORNING AND AT BEDTIME., Disp: 32.1 each, Rfl: 2   Calcium Carb-Cholecalciferol (CALCIUM 600 + D PO), Take 1 tablet by mouth daily., Disp: , Rfl:    cetirizine (ZYRTEC) 10 MG tablet, Take 10 mg by mouth daily as needed for allergies., Disp: , Rfl:    docusate sodium (COLACE) 100 MG capsule, Take 100 mg by mouth daily. , Disp: , Rfl:    empagliflozin ([name]) 10 MG TABS tablet, Take 1 tablet (10 mg total) by mouth daily before breakfast., Disp: 90  tablet, Rfl: 3   Evolocumab (REPATHA SURECLICK) 324 MG/ML SOAJ, Inject 140 mg into the skin every 14 (fourteen) days., Disp: 2 mL, Rfl: 6   metoprolol tartrate (LOPRESSOR) 25 MG tablet, Take 1 tablet (25 mg total) by mouth 2 (two) times daily., Disp: 180 tablet, Rfl: 1   mometasone (NASONEX) 50 MCG/ACT nasal spray, SHAKE LIQUID AND USE 2 SPRAYS IN EACH NOSTRIL DAILY, Disp: 17 each, Rfl: 0   Multiple Vitamins-Minerals (MULTIVITAL PO), Take 1 tablet by mouth daily. , Disp: , Rfl:    pantoprazole (PROTONIX) 40 MG tablet, TAKE 1 TABLET BY MOUTH EVERY DAY, Disp: 30 tablet, Rfl: 3   Probiotic Product (PROBIOTIC DAILY PO), Take 1 tablet by mouth daily. , Disp: , Rfl:    rosuvastatin (CRESTOR) 40 MG tablet, TAKE 1 TABLET BY MOUTH EVERY DAY, Disp: 90 tablet, Rfl: 3   sodium chloride (OCEAN) 0.65 % SOLN nasal spray, Place 1 spray into both nostrils as needed for congestion., Disp: , Rfl:   Past Medical History: Past Medical History:  Diagnosis Date   Aortic valve sclerosis 09/25/2021   Arthritis    Asthma    CAD in native artery 08/15/2020   Chickenpox    Colon polyps 2010   Epistaxis 03/29/2021   Heart murmur    History of fainting spells of unknown cause    Hyperlipidemia    Hypertension    Renal  artery stenosis ([place]) 09/25/2021   Sarcoid 06/16/1976    Tobacco Use: Social History   Tobacco Use  Smoking Status Never   Passive exposure: Past  Smokeless Tobacco Never    Labs: Review Flowsheet  More data exists      Latest Ref Rng & Units 01/29/2021 07/31/2021 01/03/2022 01/04/2022 01/05/2022  Labs for ITP Cardiac and Pulmonary Rehab  Cholestrol 0 - 200 mg/dL 177  171  141  - -  LDL (calc) 0 - 99 mg/dL 74  90  76  - -  HDL-C >40 mg/dL 69.80  61.10  49  - -  Trlycerides <150 mg/dL 164.0  99.0  82  - -  PH, Arterial 7.35 - 7.45 - - - 7.323  7.320  7.432  7.430  7.457  7.515  7.558  7.504  7.396  7.316   PCO2 arterial 32 - 48 mmHg - - - 39.3  42.2  34.9  37.0  35.1  31.1  28.0  33.4  42.7   43.4   Bicarbonate 20.0 - 28.0 mmol/L - - - 20.3  22.1  23.3  24.6  24.8  25.1  25.0  26.3  26.2  22.0   TCO2 22 - 32 mmol/L - - - '21  23  23  24  26  25  26  24  26  26  26  27  27  25  28  23   '$ Acid-base deficit 0.0 - 2.0 mmol/L - - - 5.0  4.0  1.0  4.0   O2 Saturation % - - - 98  56.6  99  100  100  100  100  100  100  100  69.7  70.6  97     Capillary Blood Glucose: Lab Results  Component Value Date   GLUCAP 99 01/12/2022   GLUCAP 96 01/12/2022   GLUCAP 147 (H) 01/11/2022   GLUCAP 119 (H) 01/11/2022   GLUCAP 111 (H) 01/11/2022     Exercise Target Goals: Exercise Program Goal: Individual exercise prescription set using results from initial 6 min walk test and THRR while considering  patient's activity barriers and safety.   Exercise Prescription Goal: Initial exercise prescription builds to 30-45 minutes a day of aerobic activity, 2-3 days per week.  Home exercise guidelines will be given to patient during program as part of exercise prescription that the participant will acknowledge.  Activity Barriers & Risk Stratification:  Activity Barriers & Cardiac Risk Stratification - 03/26/22 1026       Activity Barriers & Cardiac Risk Stratification   Activity Barriers Arthritis;History of Falls;Balance Concerns;Assistive Device;Incisional Pain;Shortness of Breath    Cardiac Risk Stratification High             6 Minute Walk:  6 Minute Walk     Row Name 03/26/22 1011         6 Minute Walk   Phase Initial     Distance 954 feet     Walk Time 6 minutes     # of Rest Breaks 0     MPH 1.81     METS 2.26     RPE 13     Perceived Dyspnea  1     VO2 Peak 7.92     Symptoms Yes (comment)     Comments Patient c/o mild SOB, RPD=1.     Resting HR 78 bpm     Resting BP 138/72     Resting Oxygen Saturation  98 %  Exercise Oxygen Saturation  during 6 min walk 93 %     Max Ex. HR 96 bpm     Max Ex. BP 158/72     2 Minute Post BP 128/82              Oxygen  Initial Assessment:   Oxygen Re-Evaluation:   Oxygen Discharge (Final Oxygen Re-Evaluation):   Initial Exercise Prescription:  Initial Exercise Prescription - 03/26/22 1100       Date of Initial Exercise RX and Referring Provider   Date 03/26/22    Referring Provider Skeet Latch, MD    Expected Discharge Date 05/24/22      Recumbant Bike   Level 1    Minutes 15    METs 1.9      NuStep   Level 1    SPM 85    Minutes 15    METs 1.9      Prescription Details   Frequency (times per week) 3    Duration Progress to 30 minutes of continuous aerobic without signs/symptoms of physical distress      Intensity   THRR 40-80% of Max Heartrate 58-115    Ratings of Perceived Exertion 11-13    Perceived Dyspnea 0-4      Progression   Progression Continue to progress workloads to maintain intensity without signs/symptoms of physical distress.      Resistance Training   Training Prescription Yes    Weight 2 lbs    Reps 10-15             Perform Capillary Blood Glucose checks as needed.  Exercise Prescription Changes:   Exercise Prescription Changes     Row Name 04/01/22 1027 04/12/22 1013           Response to Exercise   Blood Pressure (Admit) 136/78 122/70      Blood Pressure (Exercise) 158/88 142/86      Blood Pressure (Exit) 128/78 145/74      Heart Rate (Admit) 81 bpm 71 bpm      Heart Rate (Exercise) 103 bpm 80 bpm      Heart Rate (Exit) 78 bpm 71 bpm      Oxygen Saturation (Admit) -- 96 %      Oxygen Saturation (Exercise) 97 % 95 %      Oxygen Saturation (Exit) 96 % 97 %      Rating of Perceived Exertion (Exercise) 15 13      Perceived Dyspnea (Exercise) 3 3      Symptoms Patient c/o feeling lightheaded, leg fatigue, and shortness of breath. Moderate SOB      Comments Patient only able to complete 5 minutes of the 2nd station recumbent bike due to leg fatigue and shortness of breath. --      Duration Progress to 30 minutes of  aerobic without  signs/symptoms of physical distress Progress to 30 minutes of  aerobic without signs/symptoms of physical distress      Intensity THRR unchanged THRR unchanged        Progression   Progression Continue to progress workloads to maintain intensity without signs/symptoms of physical distress. Continue to progress workloads to maintain intensity without signs/symptoms of physical distress.      Average METs 1.7 1.7        Resistance Training   Training Prescription Yes Yes      Weight 1 lbs 2 lbs      Reps 10-15 10-15      Time 10 Minutes  10 Minutes        Interval Training   Interval Training No No        Recumbant Bike   Level 1 1      RPM 41 39      Watts 12 11      Minutes 5 15      METs 1.7 1.6        NuStep   Level 1 1      SPM 68 67      Minutes 15 15      METs 1.7 1.8               Exercise Comments:   Exercise Comments     Row Name 04/01/22 1129 04/15/22 1120         Exercise Comments Patient very deconditioned. Patient able to tolerate 20 minutes of aerobic exercise on the equipment before having to stop due to leg fatigue and shortness of breath. Will progress workloads gradually as tolerated. Oriented to warm-up and cool-down stretches. Patient performed resistance exercises seated with 1 lb weights. Used a chair for balance during standing stretches. Reviewed METs and goals with patient.               Exercise Goals and Review:   Exercise Goals     Row Name 03/26/22 1026             Exercise Goals   Increase Physical Activity Yes       Intervention Provide advice, education, support and counseling about physical activity/exercise needs.;Develop an individualized exercise prescription for aerobic and resistive training based on initial evaluation findings, risk stratification, comorbidities and participant's personal goals.       Expected Outcomes Short Term: Attend rehab on a regular basis to increase amount of physical activity.;Long Term:  Exercising regularly at least 3-5 days a week.;Long Term: Add in home exercise to make exercise part of routine and to increase amount of physical activity.       Increase Strength and Stamina Yes       Intervention Provide advice, education, support and counseling about physical activity/exercise needs.;Develop an individualized exercise prescription for aerobic and resistive training based on initial evaluation findings, risk stratification, comorbidities and participant's personal goals.       Expected Outcomes Short Term: Increase workloads from initial exercise prescription for resistance, speed, and METs.;Short Term: Perform resistance training exercises routinely during rehab and add in resistance training at home;Long Term: Improve cardiorespiratory fitness, muscular endurance and strength as measured by increased METs and functional capacity (6MWT)       Able to understand and use rate of perceived exertion (RPE) scale Yes       Intervention Provide education and explanation on how to use RPE scale       Expected Outcomes Short Term: Able to use RPE daily in rehab to express subjective intensity level;Long Term:  Able to use RPE to guide intensity level when exercising independently       Knowledge and understanding of Target Heart Rate Range (THRR) Yes       Intervention Provide education and explanation of THRR including how the numbers were predicted and where they are located for reference       Expected Outcomes Short Term: Able to state/look up THRR;Long Term: Able to use THRR to govern intensity when exercising independently;Short Term: Able to use daily as guideline for intensity in rehab       Able to check  pulse independently Yes       Intervention Provide education and demonstration on how to check pulse in carotid and radial arteries.;Review the importance of being able to check your own pulse for safety during independent exercise       Expected Outcomes Short Term: Able to explain  why pulse checking is important during independent exercise;Long Term: Able to check pulse independently and accurately       Understanding of Exercise Prescription Yes       Intervention Provide education, explanation, and written materials on patient's individual exercise prescription       Expected Outcomes Short Term: Able to explain program exercise prescription;Long Term: Able to explain home exercise prescription to exercise independently                Exercise Goals Re-Evaluation :  Exercise Goals Re-Evaluation     Row Name 04/01/22 1129 04/15/22 1120           Exercise Goal Re-Evaluation   Exercise Goals Review Increase Physical Activity;Increase Strength and Stamina;Able to understand and use rate of perceived exertion (RPE) scale;Able to understand and use Dyspnea scale Increase Physical Activity;Increase Strength and Stamina;Able to understand and use rate of perceived exertion (RPE) scale;Able to understand and use Dyspnea scale      Comments Patient able to understand and use RPE and dyspnea scales appropriately. Patient's goal is to get back to yoga. Because some poses involve body weight, patient will wait to check with Dr. Oval Linsey at next office visit on 05/01/22. Discussed trying to progress to 2.0 METs in the meantime, and patient is amenable to this.      Expected Outcomes Progress workloads and weights as tolerated to help increase strength and stamina. Continue to progress workloads to increase MET level. Resume yoga once cleared by cardiologist.               Discharge Exercise Prescription (Final Exercise Prescription Changes):  Exercise Prescription Changes - 04/12/22 1013       Response to Exercise   Blood Pressure (Admit) 122/70    Blood Pressure (Exercise) 142/86    Blood Pressure (Exit) 145/74    Heart Rate (Admit) 71 bpm    Heart Rate (Exercise) 80 bpm    Heart Rate (Exit) 71 bpm    Oxygen Saturation (Admit) 96 %    Oxygen Saturation (Exercise)  95 %    Oxygen Saturation (Exit) 97 %    Rating of Perceived Exertion (Exercise) 13    Perceived Dyspnea (Exercise) 3    Symptoms Moderate SOB    Duration Progress to 30 minutes of  aerobic without signs/symptoms of physical distress    Intensity THRR unchanged      Progression   Progression Continue to progress workloads to maintain intensity without signs/symptoms of physical distress.    Average METs 1.7      Resistance Training   Training Prescription Yes    Weight 2 lbs    Reps 10-15    Time 10 Minutes      Interval Training   Interval Training No      Recumbant Bike   Level 1    RPM 39    Watts 11    Minutes 15    METs 1.6      NuStep   Level 1    SPM 67    Minutes 15    METs 1.8             Nutrition:  Target Goals: Understanding of nutrition guidelines, daily intake of sodium '1500mg'$ , cholesterol '200mg'$ , calories 30% from fat and 7% or less from saturated fats, daily to have 5 or more servings of fruits and vegetables.  Biometrics:  Pre Biometrics - 03/26/22 0922       Pre Biometrics   Waist Circumference 36 inches    Hip Circumference 38 inches    Waist to Hip Ratio 0.95 %    Triceps Skinfold 17 mm    % Body Fat 35.3 %    Grip Strength 16 kg    Flexibility 0 in    Single Leg Stand 13.93 seconds              Nutrition Therapy Plan and Nutrition Goals:  Nutrition Therapy & Goals - 04/01/22 1130       Nutrition Therapy   Diet Heart Healthy Diet    Drug/Food Interactions Statins/Certain Fruits      Personal Nutrition Goals   Nutrition Goal Patient to identify strategies for reducing cardiovascular risk by attending the weekly [employer] education and nutrition series    Personal Goal #2 Patient to improve diet quality by using the plate method as a daily guide for meal planning to include lean protein/plant protein, fruits, vegetables, whole grains, nonfat dairy as part of balanced diet    Personal Goal #3 Patient to limit sodium intake to  '1500mg'$  per day    Comments Kristen Suarez reports good nutrition knowledge and following a low sodium diet. She has good support from her husband and children. Her husband does the grocery shopping and they share cooking responsibilities. She has previously completed pulmonary rehab with our program. She will continue to benefit from participation in intensive cardiac rehab for nutrition education, exercise support,and lifestyle modification.      Intervention Plan   Intervention Prescribe, educate and counsel regarding individualized specific dietary modifications aiming towards targeted core components such as weight, hypertension, lipid management, diabetes, heart failure and other comorbidities.;Nutrition handout(s) given to patient.    Expected Outcomes Short Term Goal: Understand basic principles of dietary content, such as calories, fat, sodium, cholesterol and nutrients.;Long Term Goal: Adherence to prescribed nutrition plan.             Nutrition Assessments:  Nutrition Assessments - 03/26/22 1136       Rate Your Plate Scores   Pre Score 76            MEDIFICTS Score Kristen Suarez: ?70 Need to make dietary changes  40-70 Heart Healthy Diet ? 40 Therapeutic Level Cholesterol Diet   Flowsheet Row INTENSIVE CARDIAC REHAB ORIENT from 03/26/2022 in [facility], Vascular, & Lung [facility]  Picture Your Plate Total Score on Admission 76      Picture Your Plate Scores: <42 Unhealthy dietary pattern with much room for improvement. 41-50 Dietary pattern unlikely to meet recommendations for good health and room for improvement. 51-60 More healthful dietary pattern, with some room for improvement.  >60 Healthy dietary pattern, although there may be some specific behaviors that could be improved.    Nutrition Goals Re-Evaluation:  Nutrition Goals Re-Evaluation     Kristen Suarez Name 04/01/22 1130             Goals   Current Weight 134 lb 11.2 oz (61.1 kg)        Comment lipoprotein A 285 (taking repatha), Lipids WNL       Expected Outcome Kristen Suarez reports good nutrition knowledge and following a low  sodium diet. She has good support from her husband and children. Her husband does the grocery shopping and they share cooking responsibilities. She has previously completed pulmonary rehab with our program. She will continue to benefit from participation in intensive cardiac rehab for nutrition education, exercise support,and lifestyle modification.                Nutrition Goals Re-Evaluation:  Nutrition Goals Re-Evaluation     [place] Name 04/01/22 1130             Goals   Current Weight 134 lb 11.2 oz (61.1 kg)       Comment lipoprotein A 285 (taking repatha), Lipids WNL       Expected Outcome Kristen Suarez reports good nutrition knowledge and following a low sodium diet. She has good support from her husband and children. Her husband does the grocery shopping and they share cooking responsibilities. She has previously completed pulmonary rehab with our program. She will continue to benefit from participation in intensive cardiac rehab for nutrition education, exercise support,and lifestyle modification.                Nutrition Goals Discharge (Final Nutrition Goals Re-Evaluation):  Nutrition Goals Re-Evaluation - 04/01/22 1130       Goals   Current Weight 134 lb 11.2 oz (61.1 kg)    Comment lipoprotein A 285 (taking repatha), Lipids WNL    Expected Outcome Zyanne reports good nutrition knowledge and following a low sodium diet. She has good support from her husband and children. Her husband does the grocery shopping and they share cooking responsibilities. She has previously completed pulmonary rehab with our program. She will continue to benefit from participation in intensive cardiac rehab for nutrition education, exercise support,and lifestyle modification.             Psychosocial: Target Goals: Acknowledge presence or absence of  significant depression and/or stress, maximize coping skills, provide positive support system. Participant is able to verbalize types and ability to use techniques and skills needed for reducing stress and depression.  Initial Review & Psychosocial Screening:  Initial Psych Review & Screening - 03/26/22 1250       Initial Review   Current issues with Current Anxiety/Panic      Family Dynamics   Good Support System? Yes   Kristen Suarez has her husband and 3 children for support   Comments Nylene said that she experienced a lot of anxiety after surgery and reports its getting better now.      Barriers   Psychosocial barriers to participate in program The patient should benefit from training in stress management and relaxation.      Screening Interventions   Interventions Encouraged to exercise    Expected Outcomes Long Term Goal: Stressors or current issues are controlled or eliminated.             Quality of Life Scores:  Quality of Life - 03/26/22 1138       Quality of Life   Select Quality of Life      Quality of Life Scores   Health/Function Pre 23.88 %    Socioeconomic Pre 28.57 %    Psych/Spiritual Pre 28.29 %    Family Pre 30 %    GLOBAL Pre 26.83 %            Scores of 19 and below usually indicate a poorer quality of life in these areas.  A difference of  2-3 points is a clinically meaningful difference.  A difference of 2-3 points in the total score of the Quality of Life Index has been associated with significant improvement in overall quality of life, self-image, physical symptoms, and general health in studies assessing change in quality of life.  PHQ-9: Review Flowsheet  More data exists      03/26/2022 02/25/2022 02/13/2021 01/29/2021 08/15/2020  Depression screen PHQ 2/9  Decreased Interest 0 0 0 0 1 0  Down, Depressed, Hopeless 0 0 0 0 0 0  PHQ - 2 Score 0 0 0 0 1 0  Altered sleeping 2 - - 2 -  Tired, decreased energy 2 - - 2 -  Change in appetite 0  - - 0 -  Feeling bad or failure about yourself  0 - - 0 -  Trouble concentrating 2 - - 0 -  Moving slowly or fidgety/restless 0 - - 1 -  Suicidal thoughts 0 - - 0 -  PHQ-9 Score 6 - - 6 -  Difficult doing work/chores Somewhat difficult - - Not difficult at all -   Interpretation of Total Score  Total Score Depression Severity:  1-4 = Minimal depression, 5-9 = Mild depression, 10-14 = Moderate depression, 15-19 = Moderately severe depression, 20-27 = Severe depression   Psychosocial Evaluation and Intervention:   Psychosocial Re-Evaluation:  Psychosocial Re-Evaluation     Row Name 04/01/22 1442 04/16/22 1532           Psychosocial Re-Evaluation   Current issues with Current Anxiety/Panic Current Anxiety/Panic      Comments Kristen Suarez did not report having increased anxiety or panic on her first day of exercise at intensive cardiac rehab Kristen Suarez continues not to voice any increased anxiety at intensive cardiac rehab. Kristen Suarez did voice some health conerns as she ahs had a slow recovery post OHS      Expected [place] will have decreased or controlled anxiety or panic upon completion of intesive cardiac rehab Kristen Suarez will have decreased or controlled anxiety or panic upon completion of intesive cardiac rehab      Interventions Stress management education;Relaxation education;Encouraged to attend Cardiac Rehabilitation for the exercise Stress management education;Relaxation education;Encouraged to attend Cardiac Rehabilitation for the exercise      Continue Psychosocial Services  Follow up required by staff Follow up required by staff               Psychosocial Discharge (Final Psychosocial Re-Evaluation):  Psychosocial Re-Evaluation - 04/16/22 1532       Psychosocial Re-Evaluation   Current issues with Current Anxiety/Panic    Comments Kristen Suarez continues not to voice any increased anxiety at intensive cardiac rehab. Kristen Suarez did voice some health conerns as she ahs had  a slow recovery post OHS    Expected [place] will have decreased or controlled anxiety or panic upon completion of intesive cardiac rehab    Interventions Stress management education;Relaxation education;Encouraged to attend Cardiac Rehabilitation for the exercise    Continue Psychosocial Services  Follow up required by staff             Vocational Rehabilitation: Provide vocational rehab assistance to qualifying candidates.   Vocational Rehab Evaluation & Intervention:  Vocational Rehab - 03/26/22 1254       Initial Vocational Rehab Evaluation & Intervention   Assessment shows need for Vocational Rehabilitation No   Katelinn is retired and does not need vocational rehab at this time            Education: Education Goals: Education classes will be provided  on a weekly basis, covering required topics. Participant will state understanding/return demonstration of topics presented.    Education     Row Name 04/01/22 1300     Education   Cardiac Education Topics [employer]   IT sales professional Nutrition   Nutrition Workshop Label Reading   Instruction Review Code 1- Verbalizes Understanding   Class Start Time 1145   Class Stop Time 1230   Class Time Calculation (min) 45 min    [place] Name 04/03/22 1300     Education   Cardiac Education Topics Pritikin   Financial trader   Weekly Topic [place]   Instruction Review Code 1- Verbalizes Understanding   Class Start Time 1138   Class Stop Time 1225   Class Time Calculation (min) 47 min    [place] Name 04/05/22 1200     Education   Cardiac Education Topics Pritikin   Lexicographer Nutrition   Nutrition Other  Label Reading   Instruction Review Code 1- Verbalizes Understanding   Class Start Time 1140   Class Stop Time 1225   Class Time Calculation (min) 45 min     [place] Name 04/08/22 [place]   [facility] Workshops     Workshops   Educator Exercise Physiologist   Select Psychosocial   Psychosocial Workshop Other  Focused goals and sustainable changes   Instruction Review Code 1- Verbalizes Understanding   Class Start Time 1138   Class Stop Time 1220   Class Time Calculation (min) 42 min    [place] Name 04/10/22 1600     Education   Cardiac Education Topics [employer]   Educator Dietitian   Weekly Topic Tasty Appetizers and Snacks   Instruction Review Code 1- Verbalizes Understanding   Class Start Time 1145   Class Stop Time 1221   Class Time Calculation (min) 36 min    [place] Name 04/12/22 1300     Education   Cardiac Education Topics Pritikin   Scientist, research (life sciences)   Educator Dietitian   Select Nutrition   Nutrition Calorie Density   Instruction Review Code 1- Verbalizes Understanding   Class Start Time 1142   Class Stop Time 1223   Class Time Calculation (min) 41 min    [place] Name 04/15/22 [place]   Select Workshops     Workshops   Educator Exercise Physiologist   Select Exercise   Exercise Workshop Exercise Basics: Building Your Action Plan   Instruction Review Code 1- Verbalizes Understanding   Class Start Time 1148   Class Stop Time 1232   Class Time Calculation (min) 44 min            Core Videos: Exercise    Move It!  Clinical staff conducted group or individual video education with verbal and written material and guidebook.  Patient learns the recommended Pritikin exercise program. Exercise with the goal of living a long, healthy life. Some of the health benefits of exercise include controlled diabetes, healthier blood pressure levels, improved cholesterol levels, improved heart and lung capacity, improved sleep, and better body composition. Everyone should  speak  with their doctor before starting or changing an exercise routine.  Biomechanical Limitations Clinical staff conducted group or individual video education with verbal and written material and guidebook.  Patient learns how biomechanical limitations can impact exercise and how we can mitigate and possibly overcome limitations to have an impactful and balanced exercise routine.  Body Composition Clinical staff conducted group or individual video education with verbal and written material and guidebook.  Patient learns that body composition (ratio of muscle mass to fat mass) is a Kristen Suarez component to assessing overall fitness, rather than body weight alone. Increased fat mass, especially visceral belly fat, can put [place] at increased risk for metabolic syndrome, type 2 diabetes, heart disease, and even death. It is recommended to combine diet and exercise (cardiovascular and resistance training) to improve your body composition. Seek guidance from your physician and exercise physiologist before implementing an exercise routine.  Exercise Action Plan Clinical staff conducted group or individual video education with verbal and written material and guidebook.  Patient learns the recommended strategies to achieve and enjoy long-term exercise adherence, including variety, self-motivation, self-efficacy, and positive decision making. Benefits of exercise include fitness, good health, weight management, more energy, better sleep, less stress, and overall well-being.  Medical   Heart Disease Risk Reduction Clinical staff conducted group or individual video education with verbal and written material and guidebook.  Patient learns our heart is our most vital organ as it circulates oxygen, nutrients, white blood cells, and hormones throughout the entire body, and carries waste away. Data supports a plant-based eating plan like the Pritikin Program for its effectiveness in slowing progression of and reversing heart  disease. The video provides a number of recommendations to address heart disease.   Metabolic Syndrome and Belly Fat  Clinical staff conducted group or individual video education with verbal and written material and guidebook.  Patient learns what metabolic syndrome is, how it leads to heart disease, and how one can reverse it and keep it from coming back. You have metabolic syndrome if you have 3 of the following 5 criteria: abdominal obesity, high blood pressure, high triglycerides, low HDL cholesterol, and high blood sugar.  Hypertension and Heart Disease Clinical staff conducted group or individual video education with verbal and written material and guidebook.  Patient learns that high blood pressure, or hypertension, is very common in the [place]. Hypertension is largely due to excessive salt intake, but other important risk factors include being overweight, physical inactivity, drinking too much alcohol, smoking, and not eating enough potassium from fruits and vegetables. High blood pressure is a leading risk factor for heart attack, stroke, congestive heart failure, dementia, kidney failure, and premature death. Long-term effects of excessive salt intake include stiffening of the arteries and thickening of heart muscle and organ damage. Recommendations include ways to reduce hypertension and the risk of heart disease.  Diseases of Our Time - Focusing on Diabetes Clinical staff conducted group or individual video education with verbal and written material and guidebook.  Patient learns why the best way to stop diseases of our time is prevention, through food and other lifestyle changes. Medicine (such as prescription pills and surgeries) is often only a Band-Aid on the problem, not a long-term solution. Most common diseases of our time include obesity, type 2 diabetes, hypertension, heart disease, and cancer. The Pritikin Program is recommended and has been proven to help reduce, reverse,  and/or prevent the damaging effects of metabolic syndrome.  Nutrition   Overview of the  Pritikin Eating Plan  Clinical staff conducted group or individual video education with verbal and written material and guidebook.  Patient learns about the [place] for disease risk reduction. The [place] emphasizes a wide variety of unrefined, minimally-processed carbohydrates, like fruits, vegetables, whole grains, and legumes. Go, Caution, and Stop food choices are explained. Plant-based and lean animal proteins are emphasized. Rationale provided for low sodium intake for blood pressure control, low added sugars for blood sugar stabilization, and low added fats and oils for coronary artery disease risk reduction and weight management.  Calorie Density  Clinical staff conducted group or individual video education with verbal and written material and guidebook.  Patient learns about calorie density and how it impacts the Pritikin Eating Plan. Knowing the characteristics of the food you choose will help you decide whether those foods will lead to weight gain or weight loss, and whether you want to consume more or less of them. Weight loss is usually a side effect of the Pritikin Eating Plan because of its focus on low calorie-dense foods.  Label Reading  Clinical staff conducted group or individual video education with verbal and written material and guidebook.  Patient learns about the Pritikin recommended label reading guidelines and corresponding recommendations regarding calorie density, added sugars, sodium content, and whole grains.  Dining Out - Part 1  Clinical staff conducted group or individual video education with verbal and written material and guidebook.  Patient learns that restaurant meals can be sabotaging because they can be so high in calories, fat, sodium, and/or sugar. Patient learns recommended strategies on how to positively address this and avoid unhealthy  pitfalls.  Facts on Fats  Clinical staff conducted group or individual video education with verbal and written material and guidebook.  Patient learns that lifestyle modifications can be just as effective, if not more so, as many medications for lowering your risk of heart disease. A Pritikin lifestyle can help to reduce your risk of inflammation and atherosclerosis (cholesterol build-up, or plaque, in the artery walls). Lifestyle interventions such as dietary choices and physical activity address the cause of atherosclerosis. A review of the types of fats and their impact on blood cholesterol levels, along with dietary recommendations to reduce fat intake is also included.  Nutrition Action Plan  Clinical staff conducted group or individual video education with verbal and written material and guidebook.  Patient learns how to incorporate Pritikin recommendations into their lifestyle. Recommendations include planning and keeping personal health goals in mind as an important part of their success.  Healthy Mind-Set    Healthy Minds, Bodies, Hearts  Clinical staff conducted group or individual video education with verbal and written material and guidebook.  Patient learns how to identify when they are stressed. Video will discuss the impact of that stress, as well as the many benefits of stress management. Patient will also be introduced to stress management techniques. The way we think, act, and feel has an impact on our hearts.  How Our Thoughts Can Heal Our Hearts  Clinical staff conducted group or individual video education with verbal and written material and guidebook.  Patient learns that negative thoughts can cause depression and anxiety. This can result in negative lifestyle behavior and serious health problems. Cognitive behavioral therapy is an effective method to help control our thoughts in order to change and improve our emotional outlook.  Additional Videos:  Exercise    Improving  Performance  Clinical staff conducted group or individual video education with  verbal and written material and guidebook.  Patient learns to use a non-linear approach by alternating intensity levels and lengths of time spent exercising to help burn more calories and lose more body fat. Cardiovascular exercise helps improve heart health, metabolism, hormonal balance, blood sugar control, and recovery from fatigue. Resistance training improves strength, endurance, balance, coordination, reaction time, metabolism, and muscle mass. Flexibility exercise improves circulation, posture, and balance. Seek guidance from your physician and exercise physiologist before implementing an exercise routine and learn your capabilities and proper form for all exercise.  Introduction to Yoga  Clinical staff conducted group or individual video education with verbal and written material and guidebook.  Patient learns about yoga, a discipline of the coming together of mind, breath, and body. The benefits of yoga include improved flexibility, improved range of motion, better posture and core strength, increased lung function, weight loss, and positive self-image. Yoga's heart health benefits include lowered blood pressure, healthier heart rate, decreased cholesterol and triglyceride levels, improved immune function, and reduced stress. Seek guidance from your physician and exercise physiologist before implementing an exercise routine and learn your capabilities and proper form for all exercise.  Medical   Aging: Enhancing Your Quality of Life  Clinical staff conducted group or individual video education with verbal and written material and guidebook.  Patient learns Kristen Suarez strategies and recommendations to stay in good physical health and enhance quality of life, such as prevention strategies, having an advocate, securing a [place], and keeping a list of medications and system for tracking them. It  also discusses how to avoid risk for bone loss.  Biology of Weight Control  Clinical staff conducted group or individual video education with verbal and written material and guidebook.  Patient learns that weight gain occurs because we consume more calories than we burn (eating more, moving less). Even if your body weight is normal, you may have higher ratios of fat compared to muscle mass. Too much body fat puts you at increased risk for cardiovascular disease, heart attack, stroke, type 2 diabetes, and obesity-related cancers. In addition to exercise, following the [place] can help reduce your risk.  Decoding Lab Results  Clinical staff conducted group or individual video education with verbal and written material and guidebook.  Patient learns that lab test reflects one measurement whose values change over time and are influenced by many factors, including medication, stress, sleep, exercise, food, hydration, pre-existing medical conditions, and more. It is recommended to use the knowledge from this video to become more involved with your lab results and evaluate your numbers to speak with your doctor.   Diseases of Our Time - Overview  Clinical staff conducted group or individual video education with verbal and written material and guidebook.  Patient learns that according to the CDC, 50% to 70% of chronic diseases (such as obesity, type 2 diabetes, elevated lipids, hypertension, and heart disease) are avoidable through lifestyle improvements including healthier food choices, listening to satiety cues, and increased physical activity.  Sleep Disorders Clinical staff conducted group or individual video education with verbal and written material and guidebook.  Patient learns how good quality and duration of sleep are important to overall health and well-being. Patient also learns about sleep disorders and how they impact health along with recommendations to address them, including  discussing with a physician.  Nutrition  Dining Out - Part 2 Clinical staff conducted group or individual video education with verbal and written material and guidebook.  Patient learns how to plan ahead and communicate in order to maximize their dining experience in a healthy and nutritious manner. Included are recommended food choices based on the type of restaurant the patient is visiting.   Fueling a Best boy conducted group or individual video education with verbal and written material and guidebook.  There is a strong connection between our food choices and our health. Diseases like obesity and type 2 diabetes are very prevalent and are in large-part due to lifestyle choices. The Pritikin Eating Plan provides plenty of food and hunger-curbing satisfaction. It is easy to follow, affordable, and helps reduce health risks.  Menu Workshop  Clinical staff conducted group or individual video education with verbal and written material and guidebook.  Patient learns that restaurant meals can sabotage health goals because they are often packed with calories, fat, sodium, and sugar. Recommendations include strategies to plan ahead and to communicate with the manager, chef, or server to help order a healthier meal.  Planning Your Eating Strategy  Clinical staff conducted group or individual video education with verbal and written material and guidebook.  Patient learns about the [place] and its benefit of reducing the risk of disease. The [place] does not focus on calories. Instead, it emphasizes high-quality, nutrient-rich foods. By knowing the characteristics of the foods, we choose, we can determine their calorie density and make informed decisions.  Targeting Your Nutrition Priorities  Clinical staff conducted group or individual video education with verbal and written material and guidebook.  Patient learns that lifestyle habits have a tremendous  impact on disease risk and progression. This video provides eating and physical activity recommendations based on your personal health goals, such as reducing LDL cholesterol, losing weight, preventing or controlling type 2 diabetes, and reducing high blood pressure.  Vitamins and Minerals  Clinical staff conducted group or individual video education with verbal and written material and guidebook.  Patient learns different ways to obtain Kristen Suarez vitamins and minerals, including through a recommended healthy diet. It is important to discuss all supplements you take with your doctor.   Healthy Mind-Set    Smoking Cessation  Clinical staff conducted group or individual video education with verbal and written material and guidebook.  Patient learns that cigarette smoking and tobacco addiction pose a serious health risk which affects millions of people. Stopping smoking will significantly reduce the risk of heart disease, lung disease, and many forms of cancer. Recommended strategies for quitting are covered, including working with your doctor to develop a successful plan.  Culinary   Becoming a Financial trader conducted group or individual video education with verbal and written material and guidebook.  Patient learns that cooking at home can be healthy, cost-effective, quick, and puts them in control. Keys to cooking healthy recipes will include looking at your recipe, assessing your equipment needs, planning ahead, making it simple, choosing cost-effective seasonal ingredients, and limiting the use of added fats, salts, and sugars.  Cooking - Breakfast and Snacks  Clinical staff conducted group or individual video education with verbal and written material and guidebook.  Patient learns how important breakfast is to satiety and nutrition through the entire day. Recommendations include Kristen Suarez foods to eat during breakfast to help stabilize blood sugar levels and to prevent overeating at meals  later in the day. Planning ahead is also a Kristen Suarez component.  Cooking - Human resources officer conducted group or individual video education with verbal  and written material and guidebook.  Patient learns eating strategies to improve overall health, including an approach to cook more at home. Recommendations include thinking of animal protein as a side on your plate rather than center stage and focusing instead on lower calorie dense options like vegetables, fruits, whole grains, and plant-based proteins, such as beans. Making sauces in large quantities to freeze for later and leaving the skin on your vegetables are also recommended to maximize your experience.  Cooking - Healthy Salads and Dressing Clinical staff conducted group or individual video education with verbal and written material and guidebook.  Patient learns that vegetables, fruits, whole grains, and legumes are the foundations of the [place]. Recommendations include how to incorporate each of these in flavorful and healthy salads, and how to create homemade salad dressings. Proper handling of ingredients is also covered. Cooking - Soups and [employer] - Soups and Desserts Clinical staff conducted group or individual video education with verbal and written material and guidebook.  Patient learns that Pritikin soups and desserts make for easy, nutritious, and delicious snacks and meal components that are low in sodium, fat, sugar, and calorie density, while high in vitamins, minerals, and filling fiber. Recommendations include simple and healthy ideas for soups and desserts.   Overview     The Pritikin Solution Program Overview Clinical staff conducted group or individual video education with verbal and written material and guidebook.  Patient learns that the results of the [place] Program have been documented in more than 100 articles published in peer-reviewed journals, and the benefits include reducing  risk factors for (and, in some cases, even reversing) high cholesterol, high blood pressure, type 2 diabetes, obesity, and more! An overview of the three Kristen Suarez pillars of the Pritikin Program will be covered: eating well, doing regular exercise, and having a healthy mind-set.  WORKSHOPS  Exercise: Exercise Basics: Building Your Action Plan Clinical staff led group instruction and group discussion with PowerPoint presentation and patient guidebook. To enhance the learning environment the use of posters, models and videos may be added. At the conclusion of this workshop, patients will comprehend the difference between physical activity and exercise, as well as the benefits of incorporating both, into their routine. Patients will understand the FITT (Frequency, Intensity, Time, and Type) principle and how to use it to build an exercise action plan. In addition, safety concerns and other considerations for exercise and cardiac rehab will be addressed by the presenter. The purpose of this lesson is to promote a comprehensive and effective weekly exercise routine in order to improve patients' overall level of fitness.   Managing Heart Disease: Your Path to a Healthier Heart Clinical staff led group instruction and group discussion with PowerPoint presentation and patient guidebook. To enhance the learning environment the use of posters, models and videos may be added.At the conclusion of this workshop, patients will understand the anatomy and physiology of the heart. Additionally, they will understand how Pritikin's three pillars impact the risk factors, the progression, and the management of heart disease.  The purpose of this lesson is to provide a high-level overview of the heart, heart disease, and how the Pritikin lifestyle positively impacts risk factors.  Exercise Biomechanics Clinical staff led group instruction and group discussion with PowerPoint presentation and patient guidebook. To enhance  the learning environment the use of posters, models and videos may be added. Patients will learn how the structural parts of their bodies function and how these functions impact their daily  activities, movement, and exercise. Patients will learn how to promote a neutral spine, learn how to manage pain, and identify ways to improve their physical movement in order to promote healthy living. The purpose of this lesson is to expose patients to common physical limitations that impact physical activity. Participants will learn practical ways to adapt and manage aches and pains, and to minimize their effect on regular exercise. Patients will learn how to maintain good posture while sitting, walking, and lifting.  Balance Training and Fall Prevention  Clinical staff led group instruction and group discussion with PowerPoint presentation and patient guidebook. To enhance the learning environment the use of posters, models and videos may be added. At the conclusion of this workshop, patients will understand the importance of their sensorimotor skills (vision, proprioception, and the vestibular system) in maintaining their ability to balance as they age. Patients will apply a variety of balancing exercises that are appropriate for their current level of function. Patients will understand the common causes for poor balance, possible solutions to these problems, and ways to modify their physical environment in order to minimize their fall risk. The purpose of this lesson is to teach patients about the importance of maintaining balance as they age and ways to minimize their risk of falling.  WORKSHOPS   Nutrition:  Fueling a Scientist, research (physical sciences) led group instruction and group discussion with PowerPoint presentation and patient guidebook. To enhance the learning environment the use of posters, models and videos may be added. Patients will review the foundational principles of the [place]  and understand what constitutes a serving size in each of the food groups. Patients will also learn Pritikin-friendly foods that are better choices when away from home and review make-ahead meal and snack options. Calorie density will be reviewed and applied to three nutrition priorities: weight maintenance, weight loss, and weight gain. The purpose of this lesson is to reinforce (in a group setting) the Kristen Suarez concepts around what patients are recommended to eat and how to apply these guidelines when away from home by planning and selecting [facility]-friendly options. Patients will understand how calorie density may be adjusted for different weight management goals.  Mindful Eating  Clinical staff led group instruction and group discussion with PowerPoint presentation and patient guidebook. To enhance the learning environment the use of posters, models and videos may be added. Patients will briefly review the concepts of the [place] and the importance of low-calorie dense foods. The concept of mindful eating will be introduced as well as the importance of paying attention to internal hunger signals. Triggers for non-hunger eating and techniques for dealing with triggers will be explored. The purpose of this lesson is to provide patients with the opportunity to review the basic principles of the [place], discuss the value of eating mindfully and how to measure internal cues of hunger and fullness using the Hunger Scale. Patients will also discuss reasons for non-hunger eating and learn strategies to use for controlling emotional eating.  Targeting Your Nutrition Priorities Clinical staff led group instruction and group discussion with PowerPoint presentation and patient guidebook. To enhance the learning environment the use of posters, models and videos may be added. Patients will learn how to determine their genetic susceptibility to disease by reviewing their family history. Patients  will gain insight into the importance of diet as part of an overall healthy lifestyle in mitigating the impact of genetics and other environmental insults. The purpose of this lesson is  to provide patients with the opportunity to assess their personal nutrition priorities by looking at their family history, their own health history and current risk factors. Patients will also be able to discuss ways of prioritizing and modifying the [place] for their highest risk areas  Menu  Clinical staff led group instruction and group discussion with PowerPoint presentation and patient guidebook. To enhance the learning environment the use of posters, models and videos may be added. Using menus brought in from [employer], or printed from [employer], patients will apply the [place] dining out guidelines that were presented in the [employer] video. Patients will also be able to practice these guidelines in a variety of provided scenarios. The purpose of this lesson is to provide patients with the opportunity to practice hands-on learning of the [place] with actual menus and practice scenarios.  Label Reading Clinical staff led group instruction and group discussion with PowerPoint presentation and patient guidebook. To enhance the learning environment the use of posters, models and videos may be added. Patients will review and discuss the [facility] label reading guidelines presented in [employer]'s Label Reading Educational series video. Using fool labels brought in from local grocery stores and markets, patients will apply the label reading guidelines and determine if the packaged food meet the [facility] guidelines. The purpose of this lesson is to provide patients with the opportunity to review, discuss, and practice hands-on learning of the [facility] Label Reading guidelines with actual packaged food labels. [place] Workshops  are designed to teach patients ways to prepare quick, simple, and affordable recipes at home. The importance of nutrition's role in chronic disease risk reduction is reflected in its emphasis in the overall [facility] program. By learning how to prepare essential core [facility] Eating Plan recipes, patients will increase control over what they eat; be able to customize the flavor of foods without the use of added salt, sugar, or fat; and improve the quality of the food they consume. By learning a set of core recipes which are easily assembled, quickly prepared, and affordable, patients are more likely to prepare more healthy foods at home. These workshops focus on convenient breakfasts, simple entres, side dishes, and desserts which can be prepared with minimal effort and are consistent with nutrition recommendations for cardiovascular risk reduction. Cooking [employer] are taught by a Engineer, materials (RD) who has been trained by the [employer]. The chef or RD has a clear understanding of the importance of minimizing - if not completely eliminating - added fat, sugar, and sodium in recipes. Throughout the series of [place] Workshop sessions, patients will learn about healthy ingredients and efficient methods of cooking to build confidence in their capability to prepare    Cooking School weekly topics:  Adding Flavor- Sodium-Free  Fast and Healthy Breakfasts  Powerhouse Plant-Based Proteins  Satisfying Salads and Dressings  Simple Sides and Sauces  International Cuisine-Spotlight on the [place] Zones  Delicious Desserts  Savory Soups  Efficiency Cooking - Meals in a Snap  Tasty Appetizers and Snacks  Comforting Weekend Breakfasts  One-Pot Wonders   Fast Evening Meals  Easy [place] (Psychosocial): New Thoughts, New Behaviors Clinical staff led group instruction and group discussion with  PowerPoint presentation and patient guidebook. To enhance the learning environment the use of posters, models and videos may be added. Patients will learn and practice  techniques for developing effective health and lifestyle goals. Patients will be able to effectively apply the goal setting process learned to develop at least one new personal goal.  The purpose of this lesson is to expose patients to a new skill set of behavior modification techniques such as techniques setting SMART goals, overcoming barriers, and achieving new thoughts and new behaviors.  Managing Moods and Relationships Clinical staff led group instruction and group discussion with PowerPoint presentation and patient guidebook. To enhance the learning environment the use of posters, models and videos may be added. Patients will learn how emotional and chronic stress factors can impact their health and relationships. They will learn healthy ways to manage their moods and utilize positive coping mechanisms. In addition, ICR patients will learn ways to improve communication skills. The purpose of this lesson is to expose patients to ways of understanding how one's mood and health are intimately connected. Developing a healthy outlook can help build positive relationships and connections with others. Patients will understand the importance of utilizing effective communication skills that include actively listening and being heard. They will learn and understand the importance of the "4 Cs" and especially Connections in fostering of a Healthy Mind-Set.  Healthy Sleep for a Healthy Heart Clinical staff led group instruction and group discussion with PowerPoint presentation and patient guidebook. To enhance the learning environment the use of posters, models and videos may be added. At the conclusion of this workshop, patients will be able to demonstrate knowledge of the importance of sleep to overall health, well-being, and quality of life. They  will understand the symptoms of, and treatments for, common sleep disorders. Patients will also be able to identify daytime and nighttime behaviors which impact sleep, and they will be able to apply these tools to help manage sleep-related challenges. The purpose of this lesson is to provide patients with a general overview of sleep and outline the importance of quality sleep. Patients will learn about a few of the most common sleep disorders. Patients will also be introduced to the concept of "sleep hygiene," and discover ways to self-manage certain sleeping problems through simple daily behavior changes. Finally, the workshop will motivate patients by clarifying the links between quality sleep and their goals of heart-healthy living.   Recognizing and Reducing Stress Clinical staff led group instruction and group discussion with PowerPoint presentation and patient guidebook. To enhance the learning environment the use of posters, models and videos may be added. At the conclusion of this workshop, patients will be able to understand the types of stress reactions, differentiate between acute and chronic stress, and recognize the impact that chronic stress has on their health. They will also be able to apply different coping mechanisms, such as reframing negative self-talk. Patients will have the opportunity to practice a variety of stress management techniques, such as deep abdominal breathing, progressive muscle relaxation, and/or guided imagery.  The purpose of this lesson is to educate patients on the role of stress in their lives and to provide healthy techniques for coping with it.  Learning Barriers/Preferences:  Learning Barriers/Preferences - 03/26/22 1252       Learning Barriers/Preferences   Learning Barriers Sight;Hearing   wears glasses. hearing loss in right ear. Does not wear a heariing aide   Learning Preferences Audio;Group Instruction;Individual Instruction;Pictoral;Written  Material;Verbal Instruction;Skilled Demonstration             Education Topics:  Knowledge Questionnaire Score:  Knowledge Questionnaire Score - 03/26/22 1139  Knowledge Questionnaire Score   Pre Score 20/24             Core Components/Risk Factors/Patient Goals at Admission:  Personal Goals and Risk Factors at Admission - 03/26/22 0922       Core Components/Risk Factors/Patient Goals on Admission    Weight Management Weight Maintenance;Yes    Intervention Weight Management: Develop a combined nutrition and exercise program designed to reach desired caloric intake, while maintaining appropriate intake of nutrient and fiber, sodium and fats, and appropriate energy expenditure required for the weight goal.;Weight Management: Provide education and appropriate resources to help participant work on and attain dietary goals.    Expected Outcomes Short Term: Continue to assess and modify interventions until short term weight is achieved;Long Term: Adherence to nutrition and physical activity/exercise program aimed toward attainment of established weight goal;Weight Maintenance: Understanding of the daily nutrition guidelines, which includes 25-35% calories from fat, 7% or less cal from saturated fats, less than '200mg'$  cholesterol, less than 1.5gm of sodium, & 5 or more servings of fruits and vegetables daily;Understanding recommendations for meals to include 15-35% energy as protein, 25-35% energy from fat, 35-60% energy from carbohydrates, less than '200mg'$  of dietary cholesterol, 20-35 gm of total fiber daily    Hypertension Yes    Intervention Provide education on lifestyle modifcations including regular physical activity/exercise, weight management, moderate sodium restriction and increased consumption of fresh fruit, vegetables, and low fat dairy, alcohol moderation, and smoking cessation.;Monitor prescription use compliance.    Expected Outcomes Short Term: Continued assessment and  intervention until BP is < 140/22m HG in hypertensive participants. < 130/863mHG in hypertensive participants with diabetes, heart failure or chronic kidney disease.;Long Term: Maintenance of blood pressure at goal levels.    Lipids Yes    Intervention Provide education and support for participant on nutrition & aerobic/resistive exercise along with prescribed medications to achieve LDL '70mg'$ , HDL >'40mg'$ .    Expected Outcomes Short Term: Participant states understanding of desired cholesterol values and is compliant with medications prescribed. Participant is following exercise prescription and nutrition guidelines.;Long Term: Cholesterol controlled with medications as prescribed, with individualized exercise RX and with personalized nutrition plan. Value goals: LDL < '70mg'$ , HDL > 40 mg.    Personal Goal Other Yes    Personal Goal Regain independence, not need to use cane. Be able to walk farther. Be able to perform ADLs. Get back to cooking. Have confidence to go on day excursions.    Intervention Provide individualized exercise prescription including aerobic, resistance, and stretching exercises to help increase strength and cardiorespiratory fitness, so that patient will have confidence and ability to do activities of daily living, exercise, and leisure activities.    Expected Outcomes Patient will be able to resume exercise activities, lesiure activities, and activites of daily living with confidence. Patient will be able to walk farther as measured by six-minute walk test.             Core Components/Risk Factors/Patient Goals Review:   Goals and Risk Factor Review     Row Name 04/01/22 1444 04/16/22 1535           Core Components/Risk Factors/Patient Goals Review   Personal Goals Review Weight Management/Obesity;Improve shortness of breath with ADL's;Lipids;Hypertension Weight Management/Obesity;Improve shortness of breath with ADL's;Lipids;Hypertension      Review Kristen Suarez  intensive caridac rehab on 04/01/22. Kristen Suarez fair with exercise as she is somewhat deconditoned. Vital signs were stable. Kristen Suarez report feeling lightheade on second piece of equipment.  Resolved after rest and water was given Kristen Suarez is making slow improvemnt. Kristen Suarez has stopped using her cane as she has been feeling stronger. Vital signs and oxygen saturations have been stable      Expected Outcomes Evangelynn will conitnue to participate in intensive cardiac rehab for exercise, nutrition and lifestyle modifications. Gabriell will conitnue to participate in intensive cardiac rehab for exercise, nutrition and lifestyle modifications.               Core Components/Risk Factors/Patient Goals at Discharge (Final Review):   Goals and Risk Factor Review - 04/16/22 1535       Core Components/Risk Factors/Patient Goals Review   Personal Goals Review Weight Management/Obesity;Improve shortness of breath with ADL's;Lipids;Hypertension    Review Doyne is making slow improvemnt. Yazleen has stopped using her cane as she has been feeling stronger. Vital signs and oxygen saturations have been stable    Expected Outcomes Keyon will conitnue to participate in intensive cardiac rehab for exercise, nutrition and lifestyle modifications.             ITP Comments:  ITP Comments     Row Name 03/26/22 2595 04/01/22 1440 04/16/22 1531       ITP Comments Medical Director- Dr. Fransico Him, MD, Introduction to [facility]/ Intensive Cardiac Rehab. Initial Orientation packet reviewed with the patient 30 Day ITP Review. Kristen Suarez started intensive cardiac rehab on 04/01/22. Kristen Suarez did fair with exercise as she is somewhat deconditoned. 30 Day ITP Review. Kristen Suarez started intensive cardiac rehab on 04/01/22. Kristen Suarez is slowly improving her stamina and is doing well with exercise.              Comments: See ITP Comments

## 2022-04-17 ENCOUNTER — Encounter (HOSPITAL_COMMUNITY)
Admission: RE | Admit: 2022-04-17 | Discharge: 2022-04-17 | Disposition: A | Discharge status: Home / Self Care | Payer: Medicare Other | Source: Ambulatory Visit | Attending: Cardiovascular Disease | Admitting: Cardiovascular Disease

## 2022-04-17 DIAGNOSIS — Z951 Presence of aortocoronary bypass graft: Secondary | ICD-10-CM

## 2022-04-19 ENCOUNTER — Encounter (HOSPITAL_COMMUNITY)
Admission: RE | Admit: 2022-04-19 | Discharge: 2022-04-19 | Disposition: A | Discharge status: Home / Self Care | Payer: Medicare Other | Source: Ambulatory Visit | Attending: Cardiovascular Disease | Admitting: Cardiovascular Disease

## 2022-04-19 DIAGNOSIS — Z951 Presence of aortocoronary bypass graft: Secondary | ICD-10-CM | POA: Insufficient documentation

## 2022-04-19 DIAGNOSIS — D860 Sarcoidosis of lung: Secondary | ICD-10-CM | POA: Diagnosis present

## 2022-04-22 ENCOUNTER — Encounter (HOSPITAL_COMMUNITY)
Admission: RE | Admit: 2022-04-22 | Discharge: 2022-04-22 | Disposition: A | Discharge status: Home / Self Care | Payer: Medicare Other | Source: Ambulatory Visit | Attending: Cardiovascular Disease | Admitting: Cardiovascular Disease

## 2022-04-22 DIAGNOSIS — Z951 Presence of aortocoronary bypass graft: Secondary | ICD-10-CM

## 2022-04-22 DIAGNOSIS — D860 Sarcoidosis of lung: Secondary | ICD-10-CM | POA: Diagnosis not present

## 2022-04-24 ENCOUNTER — Encounter (HOSPITAL_COMMUNITY)
Admission: RE | Admit: 2022-04-24 | Discharge: 2022-04-24 | Disposition: A | Discharge status: Home / Self Care | Payer: Medicare Other | Source: Ambulatory Visit | Attending: Cardiovascular Disease | Admitting: Cardiovascular Disease

## 2022-04-24 DIAGNOSIS — D860 Sarcoidosis of lung: Secondary | ICD-10-CM | POA: Diagnosis not present

## 2022-04-24 DIAGNOSIS — Z951 Presence of aortocoronary bypass graft: Secondary | ICD-10-CM

## 2022-04-25 ENCOUNTER — Other Ambulatory Visit: Payer: Self-pay | Admitting: Nurse Practitioner

## 2022-04-25 ENCOUNTER — Other Ambulatory Visit: Payer: Self-pay | Admitting: Physician Assistant

## 2022-04-25 DIAGNOSIS — J449 Chronic obstructive pulmonary disease, unspecified: Secondary | ICD-10-CM

## 2022-04-26 ENCOUNTER — Encounter (HOSPITAL_COMMUNITY)
Admission: RE | Admit: 2022-04-26 | Discharge: 2022-04-26 | Disposition: A | Discharge status: Home / Self Care | Payer: Medicare Other | Source: Ambulatory Visit | Attending: Cardiovascular Disease | Admitting: Cardiovascular Disease

## 2022-04-26 DIAGNOSIS — D860 Sarcoidosis of lung: Secondary | ICD-10-CM | POA: Diagnosis not present

## 2022-04-26 DIAGNOSIS — Z951 Presence of aortocoronary bypass graft: Secondary | ICD-10-CM

## 2022-04-29 ENCOUNTER — Encounter (HOSPITAL_COMMUNITY)
Admission: RE | Admit: 2022-04-29 | Discharge: 2022-04-29 | Disposition: A | Discharge status: Home / Self Care | Payer: Medicare Other | Source: Ambulatory Visit | Attending: Cardiovascular Disease | Admitting: Cardiovascular Disease

## 2022-04-29 DIAGNOSIS — D860 Sarcoidosis of lung: Secondary | ICD-10-CM | POA: Diagnosis not present

## 2022-04-29 NOTE — Progress Notes (Unsigned)
Cardiology Office Note:    Date:  04/30/2022   ID:  Kristen Suarez, DOB 19-Sep-1945, MRN [id number]  PCP:  Flossie Buffy, NP   [facility] Providers Cardiologist:  Skeet Latch, MD Electrophysiologist:  Virl Axe, MD     Referring MD: Flossie Buffy, NP   No chief complaint on file.   History of Present Illness:    Kristen Suarez is a 77 y.o. female with a hx of CAD (CABG x 4 2023), atrial appendage clipping  (12/2021), HFpEF, hypertension, renal artery stenosis (s/p R renal stent; chronically occluded left renal artery), aortic valve sclerosis, loop recorder (not functioning), COPD, GERD, CKD stage IIIb, sarcoidosis, HLD, postoperative atrial fibrillation.  She was admitted 10/16 to 01/13/2022 following a syncopal episode, she was found to be dehydrated and hypotensive.  It was noted that patient had a cardiac CT done the previous month and she was found to have carotid stenosis so the decision to proceed with a left heart cath was made.  Cath revealed severely calcified coronary arteries with multivessel CAD, calcification extending into the distal left main, diffuse 90% first diagonal stenosis, 60 to 80% proximal to mid LAD stenosis, left circumflex stenosis of 85%.  [facility] was consulted and she underwent a four-vessel CABG.  She was ultimately discharged home with home health PT.  Last seen in our office on 01/29/2022 by Laurann Montana, NP, at that time she was following up from recent CABG in October 2023.  Was evaluated by T[facility] surgeon Dr. Saunders Revel her on 03/21/2022, she was doing reasonably well at that visit, and no further follow-up with our office was needed.  She presents today for follow-up, accompanied by her husband who is a retired Marine scientist.  She states she is doing okay however she has several concerns and is not sure what is a normal recovery..  She has been participating in cardiac rehab and overall doing well, she does feel like she is making steady but  slow progress.  She continues to have some chest discomfort, but appears to be consistent with her healing sternotomy site.  She does have some shortness of breath, this is somewhat baseline for her with her sarcoidosis, but she has been using her albuterol inhaler more frequently than prior to her surgery.  There are some mixed features to her concerns, there may be a component of anxiety, she voices this as well, she was told it might be another 6 months before she feels back to her baseline and this was frustrating to hear.  She is interested in resumption of her normal activities including yoga however, hesitant as she wants to be mindful of her sternal site.  She has her blood pressure checked at cardiac rehab, reviewed those results and they are within normal range. She denies palpitations, pnd, orthopnea, n, v, dizziness, syncope, edema, weight gain, or early satiety.   Past Medical History:  Diagnosis Date   Abnormality of left atrial appendage 12/2021   s/p LAA clipping   Aortic valve sclerosis 09/25/2021   Arthritis    Asthma    CAD in native artery 08/15/2020   Chickenpox    Colon polyps 2010   Epistaxis 03/29/2021   Heart murmur    History of fainting spells of unknown cause    Hyperlipidemia    Hypertension    Renal artery stenosis (Kristen Suarez) 09/25/2021   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/1976    Past Surgical History:  Procedure Laterality Date  ABDOMINAL HYSTERECTOMY  1987   BASAL CELL CARCINOMA EXCISION  06/30/2020   nose   CARDIAC CATHETERIZATION     CLIPPING OF ATRIAL APPENDAGE N/A 01/04/2022   Procedure: CLIPPING OF ATRIAL APPENDAGE USING SIZE 40 ATRICURE ATRIAL CLIP;  Surgeon: Neomia Glass, MD;  Location: [facility];  Service: Open Heart Surgery;  Laterality: N/A;   CORONARY ARTERY BYPASS GRAFT N/A 01/04/2022   Procedure: CORONARY ARTERY BYPASS GRAFTING (CABG) x  USING ENDOSCOPIC GREATER SAPHENOUS VEIN HARVEST;  Surgeon: Neomia Glass, MD;  Location: [facility];  Service:  Open Heart Surgery;  Laterality: N/A;   LEFT HEART CATH AND CORONARY ANGIOGRAPHY N/A 01/02/2022   Procedure: LEFT HEART CATH AND CORONARY ANGIOGRAPHY;  Surgeon: Troy Sine, MD;  Location: [facility];  Service: Cardiovascular;  Laterality: N/A;   LUNG BIOPSY  06/16/1976   PERIPHERAL VASCULAR INTERVENTION Right 11/29/2020   Procedure: PERIPHERAL VASCULAR INTERVENTION;  Surgeon: Wellington Hampshire, MD;  Location: [facility];  Service: Cardiovascular;  Laterality: Right;  Renal Artery   RENAL ANGIOGRAPHY N/A 11/29/2020   Procedure: RENAL ANGIOGRAPHY;  Surgeon: Wellington Hampshire, MD;  Location: [facility];  Service: Cardiovascular;  Laterality: N/A;   TEE WITHOUT CARDIOVERSION N/A 01/04/2022   Procedure: TRANSESOPHAGEAL ECHOCARDIOGRAM (TEE);  Surgeon: Neomia Glass, MD;  Location: [facility];  Service: Open Heart Surgery;  Laterality: N/A;    Current Medications: Current Meds  Medication Sig   acetaminophen (TYLENOL) 500 MG tablet Take 500 mg by mouth every 6 (six) hours as needed for moderate pain.   albuterol (PROVENTIL) (2.5 MG/3ML) 0.083% nebulizer solution INHALE 3ML (2.5MG) VIA NEBULIZER EVERY 6 HOURS AS NEEDED FOR WHEEZING/SHORTNESS OF BREATH   amLODipine (NORVASC) 5 MG tablet Take 1 tablet (5 mg total) by mouth daily.   BREZTRI AEROSPHERE 160-9-4.8 MCG/ACT AERO INHALE 2 PUFFS INTO THE LUNGS IN THE MORNING AND AT BEDTIME.   Calcium Carb-Cholecalciferol (CALCIUM 600 + D PO) Take 1 tablet by mouth daily.   cetirizine (ZYRTEC) 10 MG tablet Take 10 mg by mouth daily as needed for allergies.   docusate sodium (COLACE) 100 MG capsule Take 100 mg by mouth daily.    empagliflozin ([name]) 10 MG TABS tablet Take 1 tablet (10 mg total) by mouth daily before breakfast.   Evolocumab (REPATHA SURECLICK) [id number] MG/ML SOAJ Inject 140 mg into the skin every 14 (fourteen) days.   metoprolol tartrate (LOPRESSOR) 25 MG tablet Take 1 tablet (25 mg total) by mouth 2 (two) times daily.    mometasone (NASONEX) 50 MCG/ACT nasal spray SHAKE LIQUID AND USE 2 SPRAYS IN EACH NOSTRIL DAILY   Multiple Vitamins-Minerals (MULTIVITAL PO) Take 1 tablet by mouth daily.    pantoprazole (PROTONIX) 40 MG tablet TAKE 1 TABLET BY MOUTH EVERY DAY   Probiotic Product (PROBIOTIC DAILY PO) Take 1 tablet by mouth daily.    rosuvastatin (CRESTOR) 40 MG tablet TAKE 1 TABLET BY MOUTH EVERY DAY   sodium chloride (OCEAN) 0.65 % SOLN nasal spray Place 1 spray into both nostrils as needed for congestion.   [DISCONTINUED] amiodarone (PACERONE) 200 MG tablet Take 1 tablet (200 mg total) by mouth 2 (two) times daily. For 14 days then reduce the dose to 1 tablet or 251m once daily.   [DISCONTINUED] aspirin EC 325 MG tablet Take 1 tablet (325 mg total) by mouth daily.     Allergies:   Keflex [cephalexin], Penicillins, and Sulfa antibiotics   Social History   Socioeconomic History  Marital status: Married    Spouse name: Not on file   Number of children: 3   Years of education: 74   Highest education level: Master's degree (e.g., MA, MS, MEng, MEd, MSW, MBA)  Occupational History   Occupation: retired  Tobacco Use   Smoking status: Never    Passive exposure: Past   Smokeless tobacco: Never  Vaping Use   Vaping Use: Never used  Substance and Sexual Activity   Alcohol use: Not Currently    Comment: once in a while   Drug use: Never   Sexual activity: Not Currently  Other Topics Concern   Not on file  Social History Narrative   Not on file   Social Determinants of Health   Financial Resource Strain: Low Risk  (02/25/2022)   Overall Financial Resource Strain (CARDIA)    Difficulty of Paying Living Expenses: Not hard at all  Food Insecurity: No Food Insecurity (02/25/2022)   Hunger Vital Sign    Worried About Running Out of Food in the Last Year: Never true    [place] in the Last Year: Never true  Transportation Needs: No Transportation Needs (02/25/2022)   PRAPARE -  Hydrologist (Medical): No    Lack of Transportation (Non-Medical): No  Physical Activity: Sufficiently Active (02/25/2022)   Exercise Vital Sign    Days of Exercise per Week: 7 days    Minutes of Exercise per Session: 40 min  Stress: No Stress Concern Present (02/25/2022)   [place]    Feeling of Stress : Not at all  Social Connections: Moderately Isolated (02/13/2021)   Social Connection and Isolation Panel [NHANES]    Frequency of Communication with Friends and Family: Twice a week    Frequency of Social Gatherings with Friends and Family: Twice a week    Attends Religious Services: Never    Marine scientist or Organizations: No    Attends Music therapist: Never    Marital Status: Married     Family History: The patient's family history includes Alcohol abuse in her brother, brother, father, and sister; Arthritis in her mother; Cancer in her mother; Depression in her mother; Early death in her brother; Heart attack in her brother and mother; Heart disease in her brother, brother, father, maternal grandfather, paternal grandfather, and sister; Hyperlipidemia in her brother, brother, father, mother, sister, sister, and son; Hypertension in her brother, father, mother, and sister; Multiple sclerosis in her daughter; Stroke in her sister.  ROS:   Please see the history of present illness.    All other systems reviewed and are negative.  EKGs/Labs/Other Studies Reviewed:    The following studies were reviewed today:  01/04/2022 intraoperative TEE -EF greater than 65%, moderate LVH.  Increased RV wall thickness mild thickening and calcification on the mitral valve anterior cusp.  Mild TR.  Mild aortic stenosis, mild thickening and moderate calcification on the aortic valve right coronary.  01/02/2022 left heart cath -    Prox Cx to Mid Cx lesion is 70% stenosed.   1st  Mrg lesion is 85% stenosed.   2nd Mrg lesion is 80% stenosed.   Mid LM to Dist LM lesion is 20% stenosed.   Ost LAD to Mid LAD lesion is 60% stenosed.   1st Diag lesion is 90% stenosed.   Mid LAD lesion is 80% stenosed.   Prox RCA-1 lesion is 80% stenosed.  Prox RCA-2 lesion is 95% stenosed.   Mid RCA lesion is 70% stenosed.   Mid RCA to Dist RCA lesion is 80% stenosed.   3rd RPL lesion is 80% stenosed.   Severely calcified coronary arteries with multivessel CAD with calcification extending into the distal left main, diffuse 90% first diagonal stenosis, 60 to 80% proximal to mid LAD stenoses; left circumflex stenoses of 85% in a moderate-sized first marginal,70% in the circumflex before the OM 2 with 80% OM 2 stenosis; and severely calcified diffuse disease in the proximal extending into the mid distal RCA with 80% focal PLA stenosis. EKG:  EKG is not ordered today.    Recent Labs: 12/31/2021: ALT 16 01/12/2022: BUN 16; Creatinine, Ser 1.20; Potassium 4.7; Sodium 135 01/13/2022: Magnesium 2.1 01/15/2022: Hemoglobin 9.5; Platelets 637  Recent Lipid Panel    Component Value Date/Time   CHOL 141 01/03/2022 0320   CHOL 209 (H) 11/21/2020 0918   TRIG 82 01/03/2022 0320   HDL 49 01/03/2022 0320   HDL 50 11/21/2020 0918   CHOLHDL 2.9 01/03/2022 0320   VLDL 16 01/03/2022 0320   LDLCALC 76 01/03/2022 0320   LDLCALC 128 (H) 11/21/2020 0918     Risk Assessment/Calculations:    CHA2DS2-VASc Score = 5   This indicates a 7.2% annual risk of stroke. The patient's score is based upon: CHF History: 0 HTN History: 1 Diabetes History: 0 Stroke History: 0 Vascular Disease History: 1 Age Score: 2 Gender Score: 1               Physical Exam:    VS:  BP 130/72   Pulse 66   Ht 5' 3.5" (1.613 m)   Wt 133 lb (60.3 kg)   BMI 23.19 kg/m     Wt Readings from Last 3 Encounters:  04/30/22 133 lb (60.3 kg)  03/26/22 134 lb 14.7 oz (61.2 kg)  03/21/22 132 lb (59.9 kg)     GEN:   Well nourished, well developed in no acute distress HEENT: Normal NECK: No JVD; No carotid bruits LYMPHATICS: No lymphadenopathy CARDIAC: RRR, no murmurs, rubs, gallops RESPIRATORY:  Clear to auscultation without rales, wheezing noted on expiration left lower lobe ABDOMEN: Soft, non-tender, non-distended MUSCULOSKELETAL:  No edema; No deformity  SKIN: Warm and dry NEUROLOGIC:  Alert and oriented x 3 PSYCHIATRIC:  Normal affect   ASSESSMENT:    1. Coronary artery disease of native artery of native heart with stable angina pectoris ([place])   2. S/P CABG x 4   3. PAF (paroxysmal atrial fibrillation) ([place])   4. Hyperlipidemia LDL goal <70   5. Renal artery stenosis ([facility])   6. Pulmonary sarcoidosis ([facility])   7. Other fatigue    PLAN:    In order of problems listed above:  CAD/CABG x 4 - s/p CABG 10/23, she has some chest discomfort but it does not appear to be anginal in nature.  She has some shortness of breath, not sure if this is a result of her sarcoidosis, deconditioning or a mixture of both.  Denies other anginal symptoms.  She continues to meet with cardiac rehab, is interested in resumption of yoga at home.  TCTS has signed off on her.  Will decreased her ASA dose to 81 mg daily. Continue ASA, [name], metoprolol, repatha.  PAF - RRR noted today, was limited to post-operative period, did not require [place], amiodarone has been d/c'd with no recurrent palpitations.  HLD - LDL on 01/03/2022 was 76, continue Crestor and Repatha,  will repeat FLP and LFTs I week prior to next OV.  HTN -blood pressure today is 130/72, continue current antihypertensive regimen Renal artery stenosis -s/p stenting to right renal artery, followed by Dr. Fletcher Anon. Fatigue -likely multifactorial due to some deconditioning from her surgery, and sarcoidosis.  Will check CBC and bmet. Pulmonary sarcoidosis -SpO2 99% today on room air, lung sounds with mild wheezing in her left lobe, she has been compliant with her  Judithann Sauger, has been using her albuterol inhaler roughly once a day.  She has a follow-up appointment with pulmonology at the end of March, encouraged her to reach out to them to see if she could be seen sooner if her shortness of breath does not get better.  Appears to be some component of anxiety as well.  Disposition-will check CBC and bmet for fatigue, return in 3 months.       Medication Adjustments/Labs and Tests Ordered: Current medicines are reviewed at length with the patient today.  Concerns regarding medicines are outlined above.  Orders Placed This Encounter  Procedures   Basic metabolic panel   CBC   No orders of the defined types were placed in this encounter.   Patient Instructions  Medication Instructions:  Your Physician recommend you continue on your current medication as directed.    *If you need a refill on your cardiac medications before your next appointment, please call your pharmacy*   Lab Work: Your physician recommends that you return for lab work today- BMP, CBC  If you have labs (blood work) drawn today and your tests are completely normal, you will receive your results only by: MyChart Message (if you have MyChart) OR A paper copy in the mail If you have any lab test that is abnormal or we need to change your treatment, we will call you to review the results.  Follow-Up: At [facility], you and your health needs are our priority.  As part of our continuing mission to provide you with exceptional heart care, we have created designated Provider Care Teams.  These Care Teams include your primary Cardiologist (physician) and Advanced Practice Providers (APPs -  Physician Assistants and Nurse Practitioners) who all work together to provide you with the care you need, when you need it.  We recommend signing up for the patient portal called "MyChart".  Sign up information is provided on this After Visit Summary.  MyChart is used to connect with patients  for Virtual Visits (Telemedicine).  Patients are able to view lab/test results, encounter notes, upcoming appointments, etc.  Non-urgent messages can be sent to your provider as well.   To learn more about what you can do with MyChart, go to [email or web address].    Your next appointment:   3 month(s)  Provider:   Skeet Latch, MD or Laurann Montana, NP    Other Instructions Heart Healthy Diet Recommendations: A low-salt diet is recommended. Meats should be grilled, baked, or boiled. Avoid fried foods. Focus on lean protein sources like fish or chicken with vegetables and fruits. The American Heart Association is a Microbiologist!  American Heart Association Diet and Lifeystyle Recommendations   Exercise recommendations: The American Heart Association recommends 150 minutes of moderate intensity exercise weekly. Try 30 minutes of moderate intensity exercise 4-5 times per week. This could include walking, jogging, or swimming.     Signed, Trudi Ida, NP  04/30/2022 1:10 PM    [facility]

## 2022-04-30 ENCOUNTER — Encounter (HOSPITAL_BASED_OUTPATIENT_CLINIC_OR_DEPARTMENT_OTHER): Payer: Self-pay | Admitting: Cardiology

## 2022-04-30 ENCOUNTER — Telehealth (HOSPITAL_BASED_OUTPATIENT_CLINIC_OR_DEPARTMENT_OTHER): Payer: Self-pay

## 2022-04-30 ENCOUNTER — Ambulatory Visit (INDEPENDENT_AMBULATORY_CARE_PROVIDER_SITE_OTHER): Payer: Medicare Other | Admitting: Cardiology

## 2022-04-30 VITALS — BP 130/72 | HR 66 | Ht 63.5 in | Wt 133.0 lb

## 2022-04-30 DIAGNOSIS — I480 Paroxysmal atrial fibrillation: Secondary | ICD-10-CM

## 2022-04-30 DIAGNOSIS — D860 Sarcoidosis of lung: Secondary | ICD-10-CM

## 2022-04-30 DIAGNOSIS — E785 Hyperlipidemia, unspecified: Secondary | ICD-10-CM

## 2022-04-30 DIAGNOSIS — I25118 Atherosclerotic heart disease of native coronary artery with other forms of angina pectoris: Secondary | ICD-10-CM

## 2022-04-30 DIAGNOSIS — Z951 Presence of aortocoronary bypass graft: Secondary | ICD-10-CM

## 2022-04-30 DIAGNOSIS — I701 Atherosclerosis of renal artery: Secondary | ICD-10-CM

## 2022-04-30 DIAGNOSIS — R5383 Other fatigue: Secondary | ICD-10-CM

## 2022-04-30 MED ORDER — ASPIRIN 81 MG PO TBEC: 81.0000 mg | DELAYED_RELEASE_TABLET | Freq: Every day | ORAL | 3 refills | Status: AC

## 2022-04-30 NOTE — Patient Instructions (Signed)
Medication Instructions:  Your Physician recommend you continue on your current medication as directed.    *If you need a refill on your cardiac medications before your next appointment, please call your pharmacy*   Lab Work: Your physician recommends that you return for lab work today- BMP, CBC  If you have labs (blood work) drawn today and your tests are completely normal, you will receive your results only by: MyChart Message (if you have MyChart) OR A paper copy in the mail If you have any lab test that is abnormal or we need to change your treatment, we will call you to review the results.  Follow-Up: At [facility], you and your health needs are our priority.  As part of our continuing mission to provide you with exceptional heart care, we have created designated Provider Care Teams.  These Care Teams include your primary Cardiologist (physician) and Advanced Practice Providers (APPs -  Physician Assistants and Nurse Practitioners) who all work together to provide you with the care you need, when you need it.  We recommend signing up for the patient portal called "MyChart".  Sign up information is provided on this After Visit Summary.  MyChart is used to connect with patients for Virtual Visits (Telemedicine).  Patients are able to view lab/test results, encounter notes, upcoming appointments, etc.  Non-urgent messages can be sent to your provider as well.   To learn more about what you can do with MyChart, go to [email or web address].    Your next appointment:   3 month(s)  Provider:   Skeet Latch, MD or Laurann Montana, NP    Other Instructions Heart Healthy Diet Recommendations: A low-salt diet is recommended. Meats should be grilled, baked, or boiled. Avoid fried foods. Focus on lean protein sources like fish or chicken with vegetables and fruits. The American Heart Association is a Microbiologist!  American Heart Association Diet and Lifeystyle Recommendations    Exercise recommendations: The American Heart Association recommends 150 minutes of moderate intensity exercise weekly. Try 30 minutes of moderate intensity exercise 4-5 times per week. This could include walking, jogging, or swimming.

## 2022-04-30 NOTE — Telephone Encounter (Addendum)
Called patient and provided the following recommendations! Patient is agreeable to the medication change and labs prior to appointment.   ----- Message from Trudi Ida, NP sent at 04/30/2022  1:23 PM EST ----- Will you please call her and tell her to stop her 325 mg ASA and start with 81 mg daily?  Also, if you would please arrange for FLP and LFT a week before 07/30/22? She will see Dr. Oval Linsey on the 14th and then she can have those results ready.  Thank you,

## 2022-05-01 ENCOUNTER — Other Ambulatory Visit: Payer: Self-pay | Admitting: Emergency Medicine

## 2022-05-01 ENCOUNTER — Encounter (HOSPITAL_BASED_OUTPATIENT_CLINIC_OR_DEPARTMENT_OTHER): Payer: Self-pay

## 2022-05-01 ENCOUNTER — Encounter (HOSPITAL_COMMUNITY)
Admission: RE | Admit: 2022-05-01 | Discharge: 2022-05-01 | Disposition: A | Discharge status: Home / Self Care | Payer: Medicare Other | Source: Ambulatory Visit | Attending: Cardiovascular Disease | Admitting: Cardiovascular Disease

## 2022-05-01 DIAGNOSIS — Z951 Presence of aortocoronary bypass graft: Secondary | ICD-10-CM

## 2022-05-01 DIAGNOSIS — D860 Sarcoidosis of lung: Secondary | ICD-10-CM | POA: Diagnosis not present

## 2022-05-01 LAB — BASIC METABOLIC PANEL WITH GFR
BUN/Creatinine Ratio: 24 (ref 12–28)
BUN: 30 mg/dL — ABNORMAL HIGH (ref 8–27)
CO2: 22 mmol/L (ref 20–29)
Calcium: 9.7 mg/dL (ref 8.7–10.3)
Chloride: 98 mmol/L (ref 96–106)
Creatinine, Ser: 1.25 mg/dL — ABNORMAL HIGH (ref 0.57–1.00)
Glucose: 103 mg/dL — ABNORMAL HIGH (ref 70–99)
Potassium: 4.5 mmol/L (ref 3.5–5.2)
Sodium: 140 mmol/L (ref 134–144)
eGFR: 45 mL/min/1.73 — ABNORMAL LOW

## 2022-05-01 LAB — CBC
Hematocrit: 40.7 % (ref 34.0–46.6)
Hemoglobin: 12.5 g/dL (ref 11.1–15.9)
MCH: 24.3 pg — ABNORMAL LOW (ref 26.6–33.0)
MCHC: 30.7 g/dL — ABNORMAL LOW (ref 31.5–35.7)
MCV: 79 fL (ref 79–97)
Platelets: 308 x10E3/uL (ref 150–450)
RBC: 5.14 x10E6/uL (ref 3.77–5.28)
RDW: 16.2 % — ABNORMAL HIGH (ref 11.7–15.4)
WBC: 8.6 x10E3/uL (ref 3.4–10.8)

## 2022-05-01 NOTE — Progress Notes (Signed)
Results called to patient who verbalized understanding. Georgana Curio MHA RN CCM

## 2022-05-03 ENCOUNTER — Encounter: Payer: Self-pay | Admitting: Emergency Medicine

## 2022-05-03 ENCOUNTER — Encounter (HOSPITAL_COMMUNITY)
Admission: RE | Admit: 2022-05-03 | Discharge: 2022-05-03 | Disposition: A | Discharge status: Home / Self Care | Payer: Medicare Other | Source: Ambulatory Visit | Attending: Cardiovascular Disease | Admitting: Cardiovascular Disease

## 2022-05-03 DIAGNOSIS — D860 Sarcoidosis of lung: Secondary | ICD-10-CM | POA: Diagnosis not present

## 2022-05-03 DIAGNOSIS — J449 Chronic obstructive pulmonary disease, unspecified: Secondary | ICD-10-CM

## 2022-05-03 DIAGNOSIS — Z951 Presence of aortocoronary bypass graft: Secondary | ICD-10-CM

## 2022-05-03 MED ORDER — ALBUTEROL SULFATE (2.5 MG/3ML) 0.083% IN NEBU: INHALATION_SOLUTION | RESPIRATORY_TRACT | 5 refills | Status: DC

## 2022-05-06 ENCOUNTER — Encounter (HOSPITAL_COMMUNITY)
Admission: RE | Admit: 2022-05-06 | Discharge: 2022-05-06 | Disposition: A | Discharge status: Home / Self Care | Payer: Medicare Other | Source: Ambulatory Visit | Attending: Cardiovascular Disease | Admitting: Cardiovascular Disease

## 2022-05-06 DIAGNOSIS — Z951 Presence of aortocoronary bypass graft: Secondary | ICD-10-CM

## 2022-05-06 DIAGNOSIS — D860 Sarcoidosis of lung: Secondary | ICD-10-CM | POA: Diagnosis not present

## 2022-05-06 NOTE — Progress Notes (Signed)
Reviewed home exercise guidelines with patient including endpoints, temperature precautions, target heart rate and rate of perceived exertion. Patient plans to walk and return to yoga starting with chair yoga and progressing to the floor as tolerated as her mode of home exercise. Patient has a pulse oximeter to check her pulse and oxygen saturation with exercise. Patient is still having some pain and shortness of breath with activity. Patient voices understanding of instructions given.  Sol Passer, MS, ACSM CEP

## 2022-05-08 ENCOUNTER — Encounter (HOSPITAL_COMMUNITY)
Admission: RE | Admit: 2022-05-08 | Discharge: 2022-05-08 | Disposition: A | Discharge status: Home / Self Care | Payer: Medicare Other | Source: Ambulatory Visit | Attending: Cardiovascular Disease | Admitting: Cardiovascular Disease

## 2022-05-08 DIAGNOSIS — D860 Sarcoidosis of lung: Secondary | ICD-10-CM

## 2022-05-08 DIAGNOSIS — Z951 Presence of aortocoronary bypass graft: Secondary | ICD-10-CM

## 2022-05-10 ENCOUNTER — Encounter (HOSPITAL_COMMUNITY)
Admission: RE | Admit: 2022-05-10 | Discharge: 2022-05-10 | Disposition: A | Discharge status: Home / Self Care | Payer: Medicare Other | Source: Ambulatory Visit | Attending: Cardiovascular Disease | Admitting: Cardiovascular Disease

## 2022-05-10 DIAGNOSIS — D860 Sarcoidosis of lung: Secondary | ICD-10-CM | POA: Diagnosis not present

## 2022-05-10 DIAGNOSIS — Z951 Presence of aortocoronary bypass graft: Secondary | ICD-10-CM

## 2022-05-10 NOTE — Progress Notes (Signed)
Cardiac Individual Treatment Plan  Patient Details  Name: Kristen Suarez MRN: [id number] Date of Birth: 02-09-1946 Referring Provider:   [place] from 03/26/2022 in [facility] for Heart, Vascular, & [place]  Referring Provider Skeet Latch, MD       Initial Encounter Date:  [place] from 03/26/2022 in [facility] for Heart, Vascular, & Lung Health  Date 03/26/22       Visit Diagnosis: 01/04/22 CABG x 4  Sarcoidosis of lung ([place])  Patient's Home Medications on Admission:  Current Outpatient Medications:    acetaminophen (TYLENOL) 500 MG tablet, Take 500 mg by mouth every 6 (six) hours as needed for moderate pain., Disp: , Rfl:    albuterol (PROVENTIL) (2.5 MG/3ML) 0.083% nebulizer solution, INHALE 3ML (2.'5MG'$ ) VIA NEBULIZER EVERY 6 HOURS AS NEEDED FOR WHEEZING/SHORTNESS OF BREATH, Disp: 150 mL, Rfl: 5   amLODipine (NORVASC) 5 MG tablet, Take 1 tablet (5 mg total) by mouth daily., Disp: 90 tablet, Rfl: 1   aspirin EC 81 MG tablet, Take 1 tablet (81 mg total) by mouth daily. Swallow whole., Disp: 90 tablet, Rfl: 3   BREZTRI AEROSPHERE 160-9-4.8 MCG/ACT AERO, INHALE 2 PUFFS INTO THE LUNGS IN THE MORNING AND AT BEDTIME., Disp: 32.1 each, Rfl: 2   Calcium Carb-Cholecalciferol (CALCIUM 600 + D PO), Take 1 tablet by mouth daily., Disp: , Rfl:    cetirizine (ZYRTEC) 10 MG tablet, Take 10 mg by mouth daily as needed for allergies., Disp: , Rfl:    docusate sodium (COLACE) 100 MG capsule, Take 100 mg by mouth daily. , Disp: , Rfl:    empagliflozin ([name]) 10 MG TABS tablet, Take 1 tablet (10 mg total) by mouth daily before breakfast., Disp: 90 tablet, Rfl: 3   Evolocumab (REPATHA SURECLICK) [id number] MG/ML SOAJ, Inject 140 mg into the skin every 14 (fourteen) days., Disp: 2 mL, Rfl: 6   metoprolol tartrate (LOPRESSOR) 25 MG tablet, Take 1 tablet (25 mg total) by mouth  2 (two) times daily., Disp: 180 tablet, Rfl: 1   mometasone (NASONEX) 50 MCG/ACT nasal spray, SHAKE LIQUID AND USE 2 SPRAYS IN EACH NOSTRIL DAILY, Disp: 17 each, Rfl: 0   Multiple Vitamins-Minerals (MULTIVITAL PO), Take 1 tablet by mouth daily. , Disp: , Rfl:    pantoprazole (PROTONIX) 40 MG tablet, TAKE 1 TABLET BY MOUTH EVERY DAY, Disp: 30 tablet, Rfl: 3   Probiotic Product (PROBIOTIC DAILY PO), Take 1 tablet by mouth daily. , Disp: , Rfl:    rosuvastatin (CRESTOR) 40 MG tablet, TAKE 1 TABLET BY MOUTH EVERY DAY, Disp: 90 tablet, Rfl: 3   sodium chloride (OCEAN) 0.65 % SOLN nasal spray, Place 1 spray into both nostrils as needed for congestion., Disp: , Rfl:   Past Medical History: Past Medical History:  Diagnosis Date   Abnormality of left atrial appendage 12/2021   s/p LAA clipping   Aortic valve sclerosis 09/25/2021   Arthritis    Asthma    CAD in native artery 08/15/2020   Chickenpox    Colon polyps 2010   Epistaxis 03/29/2021   Heart murmur    History of fainting spells of unknown cause    Hyperlipidemia    Hypertension    Renal artery stenosis ([place]) 09/25/2021   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/1976    Tobacco Use: Social History   Tobacco Use  Smoking Status Never   Passive exposure: Past  Smokeless Tobacco Never    Labs: Review Flowsheet  More data exists      Latest Ref Rng & Units 01/29/2021 07/31/2021 01/03/2022 01/04/2022 01/05/2022  Labs for ITP Cardiac and Pulmonary Rehab  Cholestrol 0 - 200 mg/dL 177  171  141  - -  LDL (calc) 0 - 99 mg/dL 74  90  76  - -  HDL-C >40 mg/dL 69.80  61.10  49  - -  Trlycerides <150 mg/dL 164.0  99.0  82  - -  PH, Arterial 7.35 - 7.45 - - - 7.323  7.320  7.432  7.430  7.457  7.515  7.558  7.504  7.396  7.316   PCO2 arterial 32 - 48 mmHg - - - 39.3  42.2  34.9  37.0  35.1  31.1  28.0  33.4  42.7  43.4   Bicarbonate 20.0 - 28.0 mmol/L - - - 20.3  22.1  23.3  24.6  24.8  25.1  25.0  26.3  26.2  22.0   TCO2 22 - 32 mmol/L  - - - '21  23  23  24  26  25  26  24  26  26  26  27  27  25  28  23   '$ Acid-base deficit 0.0 - 2.0 mmol/L - - - 5.0  4.0  1.0  4.0   O2 Saturation % - - - 98  56.6  99  100  100  100  100  100  100  100  69.7  70.6  97     Capillary Blood Glucose: Lab Results  Component Value Date   GLUCAP 99 01/12/2022   GLUCAP 96 01/12/2022   GLUCAP 147 (H) 01/11/2022   GLUCAP 119 (H) 01/11/2022   GLUCAP 111 (H) 01/11/2022     Exercise Target Goals: Exercise Program Goal: Individual exercise prescription set using results from initial 6 min walk test and THRR while considering  patient's activity barriers and safety.   Exercise Prescription Goal: Initial exercise prescription builds to 30-45 minutes a day of aerobic activity, 2-3 days per week.  Home exercise guidelines will be given to patient during program as part of exercise prescription that the participant will acknowledge.  Activity Barriers & Risk Stratification:  Activity Barriers & Cardiac Risk Stratification - 03/26/22 1026       Activity Barriers & Cardiac Risk Stratification   Activity Barriers Arthritis;History of Falls;Balance Concerns;Assistive Device;Incisional Pain;Shortness of Breath    Cardiac Risk Stratification High             6 Minute Walk:  6 Minute Walk     Row Name 03/26/22 1011         6 Minute Walk   Phase Initial     Distance 954 feet     Walk Time 6 minutes     # of Rest Breaks 0     MPH 1.81     METS 2.26     RPE 13     Perceived Dyspnea  1     VO2 Peak 7.92     Symptoms Yes (comment)     Comments Patient c/o mild SOB, RPD=1.     Resting HR 78 bpm     Resting BP 138/72     Resting Oxygen Saturation  98 %     Exercise Oxygen Saturation  during 6 min walk 93 %     Max Ex. HR 96 bpm     Max  Ex. BP 158/72     2 Minute Post BP 128/82              Oxygen Initial Assessment:   Oxygen Re-Evaluation:   Oxygen Discharge (Final Oxygen Re-Evaluation):   Initial Exercise  Prescription:  Initial Exercise Prescription - 03/26/22 1100       Date of Initial Exercise RX and Referring Provider   Date 03/26/22    Referring Provider Skeet Latch, MD    Expected Discharge Date 05/24/22      Recumbant Bike   Level 1    Minutes 15    METs 1.9      NuStep   Level 1    SPM 85    Minutes 15    METs 1.9      Prescription Details   Frequency (times per week) 3    Duration Progress to 30 minutes of continuous aerobic without signs/symptoms of physical distress      Intensity   THRR 40-80% of Max Heartrate 58-115    Ratings of Perceived Exertion 11-13    Perceived Dyspnea 0-4      Progression   Progression Continue to progress workloads to maintain intensity without signs/symptoms of physical distress.      Resistance Training   Training Prescription Yes    Weight 2 lbs    Reps 10-15             Perform Capillary Blood Glucose checks as needed.  Exercise Prescription Changes:   Exercise Prescription Changes     Row Name 04/01/22 1027 04/12/22 1013 05/03/22 1012         Response to Exercise   Blood Pressure (Admit) 136/78 122/70 146/68     Blood Pressure (Exercise) 158/88 142/86 122/70     Blood Pressure (Exit) 128/78 145/74 130/70     Heart Rate (Admit) 81 bpm 71 bpm 65 bpm     Heart Rate (Exercise) 103 bpm 80 bpm 80 bpm     Heart Rate (Exit) 78 bpm 71 bpm 71 bpm     Oxygen Saturation (Admit) -- 96 % 98 %     Oxygen Saturation (Exercise) 97 % 95 % 96 %     Oxygen Saturation (Exit) 96 % 97 % --     Rating of Perceived Exertion (Exercise) '15 13 13     '$ Perceived Dyspnea (Exercise) '3 3 2     '$ Symptoms Patient c/o feeling lightheaded, leg fatigue, and shortness of breath. Moderate SOB Mild SOB     Comments Patient only able to complete 5 minutes of the 2nd station recumbent bike due to leg fatigue and shortness of breath. -- --     Duration Progress to 30 minutes of  aerobic without signs/symptoms of physical distress Progress to 30  minutes of  aerobic without signs/symptoms of physical distress Progress to 30 minutes of  aerobic without signs/symptoms of physical distress     Intensity THRR unchanged THRR unchanged THRR unchanged       Progression   Progression Continue to progress workloads to maintain intensity without signs/symptoms of physical distress. Continue to progress workloads to maintain intensity without signs/symptoms of physical distress. Continue to progress workloads to maintain intensity without signs/symptoms of physical distress.     Average METs 1.7 1.7 1.6       Resistance Training   Training Prescription Yes Yes Yes     Weight 1 lbs 2 lbs 2 lbs     Reps 10-15 10-15 10-15  Time 10 Minutes 10 Minutes 10 Minutes       Interval Training   Interval Training No No No       Recumbant Bike   Level '1 1 1     '$ RPM 41 39 39     Watts 12 11 --     Minutes '5 15 15     '$ METs 1.7 1.6 1.6       NuStep   Level '1 1 1     '$ SPM 68 67 --     Minutes '15 15 15     '$ METs 1.7 1.8 --              Exercise Comments:   Exercise Comments     Row Name 04/01/22 1129 04/15/22 1120 05/03/22 1136 05/06/22 1052     Exercise Comments Patient very deconditioned. Patient able to tolerate 20 minutes of aerobic exercise on the equipment before having to stop due to leg fatigue and shortness of breath. Will progress workloads gradually as tolerated. Oriented to warm-up and cool-down stretches. Patient performed resistance exercises seated with 1 lb weights. Used a chair for balance during standing stretches. Reviewed METs and goals with patient. Reviewed METs and goals with patient. Reviewed home exercise guidelines, METs, and goals with patient.             Exercise Goals and Review:   Exercise Goals     Row Name 03/26/22 1026             Exercise Goals   Increase Physical Activity Yes       Intervention Provide advice, education, support and counseling about physical activity/exercise needs.;Develop  an individualized exercise prescription for aerobic and resistive training based on initial evaluation findings, risk stratification, comorbidities and participant's personal goals.       Expected Outcomes Short Term: Attend rehab on a regular basis to increase amount of physical activity.;Long Term: Exercising regularly at least 3-5 days a week.;Long Term: Add in home exercise to make exercise part of routine and to increase amount of physical activity.       Increase Strength and Stamina Yes       Intervention Provide advice, education, support and counseling about physical activity/exercise needs.;Develop an individualized exercise prescription for aerobic and resistive training based on initial evaluation findings, risk stratification, comorbidities and participant's personal goals.       Expected Outcomes Short Term: Increase workloads from initial exercise prescription for resistance, speed, and METs.;Short Term: Perform resistance training exercises routinely during rehab and add in resistance training at home;Long Term: Improve cardiorespiratory fitness, muscular endurance and strength as measured by increased METs and functional capacity (6MWT)       Able to understand and use rate of perceived exertion (RPE) scale Yes       Intervention Provide education and explanation on how to use RPE scale       Expected Outcomes Short Term: Able to use RPE daily in rehab to express subjective intensity level;Long Term:  Able to use RPE to guide intensity level when exercising independently       Knowledge and understanding of Target Heart Rate Range (THRR) Yes       Intervention Provide education and explanation of THRR including how the numbers were predicted and where they are located for reference       Expected Outcomes Short Term: Able to state/look up THRR;Long Term: Able to use THRR to govern intensity when exercising independently;Short Term: Able to  use daily as guideline for intensity in rehab        Able to check pulse independently Yes       Intervention Provide education and demonstration on how to check pulse in carotid and radial arteries.;Review the importance of being able to check your own pulse for safety during independent exercise       Expected Outcomes Short Term: Able to explain why pulse checking is important during independent exercise;Long Term: Able to check pulse independently and accurately       Understanding of Exercise Prescription Yes       Intervention Provide education, explanation, and written materials on patient's individual exercise prescription       Expected Outcomes Short Term: Able to explain program exercise prescription;Long Term: Able to explain home exercise prescription to exercise independently                Exercise Goals Re-Evaluation :  Exercise Goals Re-Evaluation     Row Name 04/01/22 1129 04/15/22 1120 05/03/22 1136 05/06/22 1052       Exercise Goal Re-Evaluation   Exercise Goals Review Increase Physical Activity;Increase Strength and Stamina;Able to understand and use rate of perceived exertion (RPE) scale;Able to understand and use Dyspnea scale Increase Physical Activity;Increase Strength and Stamina;Able to understand and use rate of perceived exertion (RPE) scale;Able to understand and use Dyspnea scale Increase Physical Activity;Increase Strength and Stamina;Able to understand and use rate of perceived exertion (RPE) scale;Able to understand and use Dyspnea scale Increase Physical Activity;Increase Strength and Stamina;Able to understand and use rate of perceived exertion (RPE) scale;Able to understand and use Dyspnea scale;Able to check pulse independently;Knowledge and understanding of Target Heart Rate Range (THRR);Understanding of Exercise Prescription    Comments Patient able to understand and use RPE and dyspnea scales appropriately. Patient's goal is to get back to yoga. Because some poses involve body weight, patient will wait  to check with Dr. Oval Linsey at next office visit on 05/01/22. Discussed trying to progress to 2.0 METs in the meantime, and patient is amenable to this. Patient's goal is to get stronger, be able to do her own housework, and more cooking. Patient is still having some surgical patient, which her physician says is normal. Patient still limited by SOB. Reviewed exercise prescription with patient. Patient is doing some walking and plans to return to yoga. Patient is also using 2 lb weights for her resistance training at home.    Expected Outcomes Progress workloads and weights as tolerated to help increase strength and stamina. Continue to progress workloads to increase MET level. Resume yoga once cleared by cardiologist. Continue to progress workloads as tolerated to help increase strength and cardiorespiratory fitness. Patient will increase walking at home and resume yoga to help increase strength, stamina, and mobility and decrease SOB.             Discharge Exercise Prescription (Final Exercise Prescription Changes):  Exercise Prescription Changes - 05/03/22 1012       Response to Exercise   Blood Pressure (Admit) 146/68    Blood Pressure (Exercise) 122/70    Blood Pressure (Exit) 130/70    Heart Rate (Admit) 65 bpm    Heart Rate (Exercise) 80 bpm    Heart Rate (Exit) 71 bpm    Oxygen Saturation (Admit) 98 %    Oxygen Saturation (Exercise) 96 %    Rating of Perceived Exertion (Exercise) 13    Perceived Dyspnea (Exercise) 2    Symptoms Mild SOB  Duration Progress to 30 minutes of  aerobic without signs/symptoms of physical distress    Intensity THRR unchanged      Progression   Progression Continue to progress workloads to maintain intensity without signs/symptoms of physical distress.    Average METs 1.6      Resistance Training   Training Prescription Yes    Weight 2 lbs    Reps 10-15    Time 10 Minutes      Interval Training   Interval Training No      Recumbant Bike    Level 1    RPM 39    Minutes 15    METs 1.6      NuStep   Level 1    Minutes 15             Nutrition:  Target Goals: Understanding of nutrition guidelines, daily intake of sodium '1500mg'$ , cholesterol '200mg'$ , calories 30% from fat and 7% or less from saturated fats, daily to have 5 or more servings of fruits and vegetables.  Biometrics:  Pre Biometrics - 03/26/22 0922       Pre Biometrics   Waist Circumference 36 inches    Hip Circumference 38 inches    Waist to Hip Ratio 0.95 %    Triceps Skinfold 17 mm    % Body Fat 35.3 %    Grip Strength 16 kg    Flexibility 0 in    Single Leg Stand 13.93 seconds              Nutrition Therapy Plan and Nutrition Goals:  Nutrition Therapy & Goals - 05/01/22 0825       Nutrition Therapy   Diet Heart Healthy Diet    Drug/Food Interactions Statins/Certain Fruits      Personal Nutrition Goals   Nutrition Goal Patient to identify strategies for reducing cardiovascular risk by attending the weekly Pritikin education and nutrition series    Personal Goal #2 Patient to improve diet quality by using the plate method as a daily guide for meal planning to include lean protein/plant protein, fruits, vegetables, whole grains, nonfat dairy as part of balanced diet    Personal Goal #3 Patient to limit sodium intake to '1500mg'$  per day    Personal Goal #4 Patient to identify strategies for weight gain/weight maintenance of 0.5-2.0# per week.    Comments Goals in action. Manus Gunning continues to follow a heart healthy diet monitoring sodium, sugar, saturated fat, and fiber intake. She continues to attend the Pritikin education and nutritin series. We have discussed strategies for weight gain/weight maintenance as she is down ~13#  over the last year (146# per documentation 05/31/2021); strategies discussed include increasing eating frequency, increasing calories from fat/protein/carbohydrates, nutrition supplements, etc. Her husband and family remain  incredibly supportive. Manus Gunning will continue to benefit from participation in intensive cardiac rehab for nutrition, exercise, and lifestyle modification.      Intervention Plan   Intervention Prescribe, educate and counsel regarding individualized specific dietary modifications aiming towards targeted core components such as weight, hypertension, lipid management, diabetes, heart failure and other comorbidities.;Nutrition handout(s) given to patient.    Expected Outcomes Short Term Goal: Understand basic principles of dietary content, such as calories, fat, sodium, cholesterol and nutrients.;Long Term Goal: Adherence to prescribed nutrition plan.;Short Term Goal: A plan has been developed with personal nutrition goals set during dietitian appointment.             Nutrition Assessments:  Nutrition Assessments - 03/26/22 1136  Rate Your Plate Scores   Pre Score 76            MEDIFICTS Score Key: ?70 Need to make dietary changes  40-70 Heart Healthy Diet ? 40 Therapeutic Level Cholesterol Diet   Flowsheet Row INTENSIVE CARDIAC REHAB ORIENT from 03/26/2022 in [facility] Heart, Vascular, & Lung Health  Picture Your Plate Total Score on Admission 76      Picture Your Plate Scores: [id number] Unhealthy dietary pattern with much room for improvement. 41-50 Dietary pattern unlikely to meet recommendations for good health and room for improvement. 51-60 More healthful dietary pattern, with some room for improvement.  >60 Healthy dietary pattern, although there may be some specific behaviors that could be improved.    Nutrition Goals Re-Evaluation:  Nutrition Goals Re-Evaluation     [place] Name 04/01/22 1130 05/01/22 0825           Goals   Current Weight 134 lb 11.2 oz (61.1 kg) 133 lb 2.5 oz (60.4 kg)      Comment lipoprotein A 285 (taking repatha), Lipids WNL GFr 45, Cr 1.25, LDL 76- continues repatha, crestor. She is down 1.8# since starting with our  program.      Expected Outcome Aleshia reports good nutrition knowledge and following a low sodium diet. She has good support from her husband and children. Her husband does the grocery shopping and they share cooking responsibilities. She has previously completed pulmonary rehab with our program. She will continue to benefit from participation in intensive cardiac rehab for nutrition education, exercise support,and lifestyle modification. Goals in action. Manus Gunning continues to follow a heart healthy diet monitoring sodium, sugar, saturated fat, and fiber intake. She continues to attend the Pritikin education and nutritin series. We have discussed strategies for weight gain/weight maintenance as she is down ~13# over the last year (146# per documentation 05/31/2021); strategies discussed include increasing eating frequency, increasing calories from fat/protein/carbohydrates, nutrition supplements, etc. Her husband and family remain incredibly supportive. Manus Gunning will continue to benefit from participation in intensive cardiac rehab for nutrition, exercise, and lifestyle modification.               Nutrition Goals Re-Evaluation:  Nutrition Goals Re-Evaluation     [place] Name 04/01/22 1130 05/01/22 0825           Goals   Current Weight 134 lb 11.2 oz (61.1 kg) 133 lb 2.5 oz (60.4 kg)      Comment lipoprotein A 285 (taking repatha), Lipids WNL GFr 45, Cr 1.25, LDL 76- continues repatha, crestor. She is down 1.8# since starting with our program.      Expected Outcome Rosaelena reports good nutrition knowledge and following a low sodium diet. She has good support from her husband and children. Her husband does the grocery shopping and they share cooking responsibilities. She has previously completed pulmonary rehab with our program. She will continue to benefit from participation in intensive cardiac rehab for nutrition education, exercise support,and lifestyle modification. Goals in action. Manus Gunning continues to  follow a heart healthy diet monitoring sodium, sugar, saturated fat, and fiber intake. She continues to attend the Pritikin education and nutritin series. We have discussed strategies for weight gain/weight maintenance as she is down ~13# over the last year (146# per documentation 05/31/2021); strategies discussed include increasing eating frequency, increasing calories from fat/protein/carbohydrates, nutrition supplements, etc. Her husband and family remain incredibly supportive. Manus Gunning will continue to benefit from participation in intensive cardiac rehab for nutrition, exercise, and  lifestyle modification.               Nutrition Goals Discharge (Final Nutrition Goals Re-Evaluation):  Nutrition Goals Re-Evaluation - 05/01/22 0825       Goals   Current Weight 133 lb 2.5 oz (60.4 kg)    Comment GFr 45, Cr 1.25, LDL 76- continues repatha, crestor. She is down 1.8# since starting with our program.    Expected Outcome Goals in action. Manus Gunning continues to follow a heart healthy diet monitoring sodium, sugar, saturated fat, and fiber intake. She continues to attend the Pritikin education and nutritin series. We have discussed strategies for weight gain/weight maintenance as she is down ~13# over the last year (146# per documentation 05/31/2021); strategies discussed include increasing eating frequency, increasing calories from fat/protein/carbohydrates, nutrition supplements, etc. Her husband and family remain incredibly supportive. Manus Gunning will continue to benefit from participation in intensive cardiac rehab for nutrition, exercise, and lifestyle modification.             Psychosocial: Target Goals: Acknowledge presence or absence of significant depression and/or stress, maximize coping skills, provide positive support system. Participant is able to verbalize types and ability to use techniques and skills needed for reducing stress and depression.  Initial Review & Psychosocial Screening:  Initial  Psych Review & Screening - 03/26/22 1250       Initial Review   Current issues with Current Anxiety/Panic      Family Dynamics   Good Support System? Yes   Yanet has her husband and 3 children for support   Comments Ngela said that she experienced a lot of anxiety after surgery and reports its getting better now.      Barriers   Psychosocial barriers to participate in program The patient should benefit from training in stress management and relaxation.      Screening Interventions   Interventions Encouraged to exercise    Expected Outcomes Long Term Goal: Stressors or current issues are controlled or eliminated.             Quality of Life Scores:  Quality of Life - 03/26/22 1138       Quality of Life   Select Quality of Life      Quality of Life Scores   Health/Function Pre 23.88 %    Socioeconomic Pre 28.57 %    Psych/Spiritual Pre 28.29 %    Family Pre 30 %    GLOBAL Pre 26.83 %            Scores of 19 and below usually indicate a poorer quality of life in these areas.  A difference of  2-3 points is a clinically meaningful difference.  A difference of 2-3 points in the total score of the Quality of Life Index has been associated with significant improvement in overall quality of life, self-image, physical symptoms, and general health in studies assessing change in quality of life.  PHQ-9: Review Flowsheet  More data exists      03/26/2022 02/25/2022 02/13/2021 01/29/2021 08/15/2020  Depression screen PHQ 2/9  Decreased Interest 0 0 0 0 1 0  Down, Depressed, Hopeless 0 0 0 0 0 0  PHQ - 2 Score 0 0 0 0 1 0  Altered sleeping 2 - - 2 -  Tired, decreased energy 2 - - 2 -  Change in appetite 0 - - 0 -  Feeling bad or failure about yourself  0 - - 0 -  Trouble concentrating 2 - - 0 -  Moving slowly or fidgety/restless 0 - - 1 -  Suicidal thoughts 0 - - 0 -  PHQ-9 Score 6 - - 6 -  Difficult doing work/chores Somewhat difficult - - Not difficult at all -    Interpretation of Total Score  Total Score Depression Severity:  1-4 = Minimal depression, 5-9 = Mild depression, 10-14 = Moderate depression, 15-19 = Moderately severe depression, 20-27 = Severe depression   Psychosocial Evaluation and Intervention:   Psychosocial Re-Evaluation:  Psychosocial Re-Evaluation     [place] Name 04/01/22 1442 04/16/22 1532 05/10/22 1352         Psychosocial Re-Evaluation   Current issues with Current Anxiety/Panic Current Anxiety/Panic Current Anxiety/Panic     Comments Dakeisha did not report having increased anxiety or panic on her first day of exercise at intensive cardiac rehab Lavisha continues not to voice any increased anxiety at intensive cardiac rehab. Catori did voice some health conerns as she ahs had a slow recovery post [place] continues not to voice any increased anxiety at intensive cardiac rehab. [place] feels like she is getting stronger.     Expected Outcomes Ethal will have decreased or controlled anxiety or panic upon completion of intesive cardiac rehab Jasmarie will have decreased or controlled anxiety or panic upon completion of intesive cardiac rehab Selina will have decreased or controlled anxiety or panic upon completion of intesive cardiac rehab     Interventions Stress management education;Relaxation education;Encouraged to attend Cardiac Rehabilitation for the exercise Stress management education;Relaxation education;Encouraged to attend Cardiac Rehabilitation for the exercise Stress management education;Relaxation education;Encouraged to attend Cardiac Rehabilitation for the exercise     Continue Psychosocial Services  Follow up required by staff Follow up required by staff No Follow up required              Psychosocial Discharge (Final Psychosocial Re-Evaluation):  Psychosocial Re-Evaluation - 05/10/22 1352       Psychosocial Re-Evaluation   Current issues with Current Anxiety/Panic    Comments Marizela  continues not to voice any increased anxiety at intensive cardiac rehab. Pema feels like she is getting stronger.    Expected Outcomes Oyuky will have decreased or controlled anxiety or panic upon completion of intesive cardiac rehab    Interventions Stress management education;Relaxation education;Encouraged to attend Cardiac Rehabilitation for the exercise    Continue Psychosocial Services  No Follow up required             Vocational Rehabilitation: Provide vocational rehab assistance to qualifying candidates.   Vocational Rehab Evaluation & Intervention:  Vocational Rehab - 03/26/22 1254       Initial Vocational Rehab Evaluation & Intervention   Assessment shows need for Vocational Rehabilitation No   Inetta is retired and does not need vocational rehab at this time            Education: Education Goals: Education classes will be provided on a weekly basis, covering required topics. Participant will state understanding/return demonstration of topics presented.    Education     Row Name 04/01/22 1300     Education   Cardiac Education Topics Pritikin   IT sales professional Nutrition   Nutrition Workshop Label Reading   Instruction Review Code 1- Verbalizes Understanding   Class Start Time 1145   Class Stop Time 1230   Class Time Calculation (min) 45 min    [place] Name 04/03/22 1300     Education   Cardiac  Education Topics Social research officer, government Desserts   Instruction Review Code 1- Verbalizes Understanding   Class Start Time 1138   Class Stop Time 1225   Class Time Calculation (min) 47 min    [place] Name 04/05/22 1200     Education   Cardiac Education Topics Pritikin   Select Core Videos     Core Videos   Educator Dietitian   Select Nutrition   Nutrition Other  Label Reading   Instruction Review Code 1- Verbalizes Understanding    Class Start Time 1140   Class Stop Time 1225   Class Time Calculation (min) 45 min    [place] Name 04/08/22 [employer]   Select Workshops     Workshops   Educator Exercise Physiologist   Select Psychosocial   Psychosocial Workshop Other  Focused goals and sustainable changes   Instruction Review Code 1- Verbalizes Understanding   Class Start Time 1138   Class Stop Time 1220   Class Time Calculation (min) 42 min    [place] Name 04/10/22 1600     Education   Cardiac Education Topics [employer]   Educator Dietitian   Weekly Topic Tasty Appetizers and Snacks   Instruction Review Code 1- Verbalizes Understanding   Class Start Time 1145   Class Stop Time 1221   Class Time Calculation (min) 36 min    [place] Name 04/12/22 1300     Education   Cardiac Education Topics Pritikin   Scientist, research (life sciences)   Educator Dietitian   Select Nutrition   Nutrition Calorie Density   Instruction Review Code 1- Verbalizes Understanding   Class Start Time 1142   Class Stop Time 1223   Class Time Calculation (min) 41 min    [place] Name 04/15/22 [place]   [employer]     Workshops   Educator Exercise Physiologist   Select Exercise   Exercise Workshop Exercise Basics: Building Your Action Plan   Instruction Review Code 1- Verbalizes Understanding   Class Start Time 1148   Class Stop Time 1232   Class Time Calculation (min) 44 min    [place] Name 04/17/22 1300     Education   Cardiac Education Topics Pritikin   Financial trader   Weekly Topic Efficiency Cooking - Meals in a Snap   Instruction Review Code 1- Verbalizes Understanding   Class Start Time 1145   Class Stop Time 1224   Class Time Calculation (min) 39 min    [place] Name 04/19/22 1200     Education   Cardiac Education Topics Pritikin    Academic librarian Exercise Education   Exercise Education Move It!   Instruction Review Code 1- Verbalizes Understanding   Class Start Time 1148   Class Stop Time 1221   Class Time Calculation (min) 33 min    [place] Name 04/22/22 1300     Education   Cardiac Education Topics Pritikin   Lexicographer Nutrition   Nutrition Nutrition  Action Plan   Instruction Review Code 1- Verbalizes Understanding   Class Start Time 1140   Class Stop Time 1220   Class Time Calculation (min) 40 min    [place] Name 04/24/22 1300     Education   Cardiac Education Topics [employer]   Educator Dietitian   Weekly Topic One-Pot Wonders   Instruction Review Code 1- Verbalizes Understanding   Class Start Time 1145   Class Stop Time 1225   Class Time Calculation (min) 40 min    [place] Name 04/26/22 1200     Education   Cardiac Education Topics Pritikin   Architect Education   General Education Hypertension and Heart Disease   Instruction Review Code 1- Verbalizes Understanding   Class Start Time 1145   Class Stop Time 1230   Class Time Calculation (min) 45 min    [place] Name 04/29/22 1200     Education   Cardiac Education Topics Pritikin   Select Workshops     Workshops   Educator Exercise Physiologist   Psychosocial Workshop Healthy Sleep for a Healthy Heart   Instruction Review Code 1- Verbalizes Understanding   Class Start Time 1155   Class Stop Time 1238   Class Time Calculation (min) 43 min    [place] Name 05/01/22 1600     Education   Cardiac Education Topics Pritikin   Financial trader   Weekly Topic Comforting Weekend Breakfasts   Instruction Review Code 1- Verbalizes Understanding   Class Start Time 1155    Class Stop Time 1232   Class Time Calculation (min) 37 min    [place] Name 05/03/22 1200     Education   Cardiac Education Topics Pritikin   Scientist, research (life sciences)   Educator Dietitian   Nutrition Dining Out - Part 1   Instruction Review Code 1- Verbalizes Understanding   Class Start Time 1155   Class Stop Time 1230   Class Time Calculation (min) 35 min    [place] Name 05/06/22 1300     Education   Cardiac Education Topics Pritikin   Academic librarian Exercise Education   Exercise Education Biomechanial Limitations   Instruction Review Code 1- Verbalizes Understanding   Class Start Time 1150   Class Stop Time 1231   Class Time Calculation (min) 41 min    [place] Name 05/08/22 1215     Education   Cardiac Education Topics Pritikin   Financial trader   Weekly Topic Fast Evening Meals   Instruction Review Code 1- Verbalizes Understanding   Class Start Time 1140   Class Stop Time 1215   Class Time Calculation (min) 35 min    [place] Name 05/10/22 1200     Education   Cardiac Education Topics Pritikin   IT sales professional Nutrition   Nutrition Workshop Fueling a Designer, multimedia   Instruction Review Code 1- Information systems manager   Class Start Time 1147   Class Stop Time 1233   Class Time Calculation (min) 46 min  Core Videos: Exercise    Move It!  Clinical staff conducted group or individual video education with verbal and written material and guidebook.  Patient learns the recommended Pritikin exercise program. Exercise with the goal of living a long, healthy life. Some of the health benefits of exercise include controlled diabetes, healthier blood pressure levels, improved cholesterol levels, improved heart and lung capacity, improved sleep, and better body composition. Everyone should speak with  their doctor before starting or changing an exercise routine.  Biomechanical Limitations Clinical staff conducted group or individual video education with verbal and written material and guidebook.  Patient learns how biomechanical limitations can impact exercise and how we can mitigate and possibly overcome limitations to have an impactful and balanced exercise routine.  Body Composition Clinical staff conducted group or individual video education with verbal and written material and guidebook.  Patient learns that body composition (ratio of muscle mass to fat mass) is a key component to assessing overall fitness, rather than body weight alone. Increased fat mass, especially visceral belly fat, can put [place] at increased risk for metabolic syndrome, type 2 diabetes, heart disease, and even death. It is recommended to combine diet and exercise (cardiovascular and resistance training) to improve your body composition. Seek guidance from your physician and exercise physiologist before implementing an exercise routine.  Exercise Action Plan Clinical staff conducted group or individual video education with verbal and written material and guidebook.  Patient learns the recommended strategies to achieve and enjoy long-term exercise adherence, including variety, self-motivation, self-efficacy, and positive decision making. Benefits of exercise include fitness, good health, weight management, more energy, better sleep, less stress, and overall well-being.  Medical   Heart Disease Risk Reduction Clinical staff conducted group or individual video education with verbal and written material and guidebook.  Patient learns our heart is our most vital organ as it circulates oxygen, nutrients, white blood cells, and hormones throughout the entire body, and carries waste away. Data supports a plant-based eating plan like the Pritikin Program for its effectiveness in slowing progression of and reversing heart disease.  The video provides a number of recommendations to address heart disease.   Metabolic Syndrome and Belly Fat  Clinical staff conducted group or individual video education with verbal and written material and guidebook.  Patient learns what metabolic syndrome is, how it leads to heart disease, and how one can reverse it and keep it from coming back. You have metabolic syndrome if you have 3 of the following 5 criteria: abdominal obesity, high blood pressure, high triglycerides, low HDL cholesterol, and high blood sugar.  Hypertension and Heart Disease Clinical staff conducted group or individual video education with verbal and written material and guidebook.  Patient learns that high blood pressure, or hypertension, is very common in the [place]. Hypertension is largely due to excessive salt intake, but other important risk factors include being overweight, physical inactivity, drinking too much alcohol, smoking, and not eating enough potassium from fruits and vegetables. High blood pressure is a leading risk factor for heart attack, stroke, congestive heart failure, dementia, kidney failure, and premature death. Long-term effects of excessive salt intake include stiffening of the arteries and thickening of heart muscle and organ damage. Recommendations include ways to reduce hypertension and the risk of heart disease.  Diseases of Our Time - Focusing on Diabetes Clinical staff conducted group or individual video education with verbal and written material and guidebook.  Patient learns why the best way to stop diseases of our time  is prevention, through food and other lifestyle changes. Medicine (such as prescription pills and surgeries) is often only a Band-Aid on the problem, not a long-term solution. Most common diseases of our time include obesity, type 2 diabetes, hypertension, heart disease, and cancer. The Pritikin Program is recommended and has been proven to help reduce, reverse, and/or  prevent the damaging effects of metabolic syndrome.  Nutrition   Overview of the Pritikin Eating Plan  Clinical staff conducted group or individual video education with verbal and written material and guidebook.  Patient learns about the [place] for disease risk reduction. The [place] emphasizes a wide variety of unrefined, minimally-processed carbohydrates, like fruits, vegetables, whole grains, and legumes. Go, Caution, and Stop food choices are explained. Plant-based and lean animal proteins are emphasized. Rationale provided for low sodium intake for blood pressure control, low added sugars for blood sugar stabilization, and low added fats and oils for coronary artery disease risk reduction and weight management.  Calorie Density  Clinical staff conducted group or individual video education with verbal and written material and guidebook.  Patient learns about calorie density and how it impacts the Pritikin Eating Plan. Knowing the characteristics of the food you choose will help you decide whether those foods will lead to weight gain or weight loss, and whether you want to consume more or less of them. Weight loss is usually a side effect of the Pritikin Eating Plan because of its focus on low calorie-dense foods.  Label Reading  Clinical staff conducted group or individual video education with verbal and written material and guidebook.  Patient learns about the Pritikin recommended label reading guidelines and corresponding recommendations regarding calorie density, added sugars, sodium content, and whole grains.  Dining Out - Part 1  Clinical staff conducted group or individual video education with verbal and written material and guidebook.  Patient learns that restaurant meals can be sabotaging because they can be so high in calories, fat, sodium, and/or sugar. Patient learns recommended strategies on how to positively address this and avoid unhealthy  pitfalls.  Facts on Fats  Clinical staff conducted group or individual video education with verbal and written material and guidebook.  Patient learns that lifestyle modifications can be just as effective, if not more so, as many medications for lowering your risk of heart disease. A Pritikin lifestyle can help to reduce your risk of inflammation and atherosclerosis (cholesterol build-up, or plaque, in the artery walls). Lifestyle interventions such as dietary choices and physical activity address the cause of atherosclerosis. A review of the types of fats and their impact on blood cholesterol levels, along with dietary recommendations to reduce fat intake is also included.  Nutrition Action Plan  Clinical staff conducted group or individual video education with verbal and written material and guidebook.  Patient learns how to incorporate Pritikin recommendations into their lifestyle. Recommendations include planning and keeping personal health goals in mind as an important part of their success.  Healthy Mind-Set    Healthy Minds, Bodies, Hearts  Clinical staff conducted group or individual video education with verbal and written material and guidebook.  Patient learns how to identify when they are stressed. Video will discuss the impact of that stress, as well as the many benefits of stress management. Patient will also be introduced to stress management techniques. The way we think, act, and feel has an impact on our hearts.  How Our Thoughts Can Heal Our Hearts  Clinical staff conducted group or individual video  education with verbal and written material and guidebook.  Patient learns that negative thoughts can cause depression and anxiety. This can result in negative lifestyle behavior and serious health problems. Cognitive behavioral therapy is an effective method to help control our thoughts in order to change and improve our emotional outlook.  Additional Videos:  Exercise    Improving  Performance  Clinical staff conducted group or individual video education with verbal and written material and guidebook.  Patient learns to use a non-linear approach by alternating intensity levels and lengths of time spent exercising to help burn more calories and lose more body fat. Cardiovascular exercise helps improve heart health, metabolism, hormonal balance, blood sugar control, and recovery from fatigue. Resistance training improves strength, endurance, balance, coordination, reaction time, metabolism, and muscle mass. Flexibility exercise improves circulation, posture, and balance. Seek guidance from your physician and exercise physiologist before implementing an exercise routine and learn your capabilities and proper form for all exercise.  Introduction to Yoga  Clinical staff conducted group or individual video education with verbal and written material and guidebook.  Patient learns about yoga, a discipline of the coming together of mind, breath, and body. The benefits of yoga include improved flexibility, improved range of motion, better posture and core strength, increased lung function, weight loss, and positive self-image. Yoga's heart health benefits include lowered blood pressure, healthier heart rate, decreased cholesterol and triglyceride levels, improved immune function, and reduced stress. Seek guidance from your physician and exercise physiologist before implementing an exercise routine and learn your capabilities and proper form for all exercise.  Medical   Aging: Enhancing Your Quality of Life  Clinical staff conducted group or individual video education with verbal and written material and guidebook.  Patient learns key strategies and recommendations to stay in good physical health and enhance quality of life, such as prevention strategies, having an advocate, securing a [place], and keeping a list of medications and system for tracking them. It  also discusses how to avoid risk for bone loss.  Biology of Weight Control  Clinical staff conducted group or individual video education with verbal and written material and guidebook.  Patient learns that weight gain occurs because we consume more calories than we burn (eating more, moving less). Even if your body weight is normal, you may have higher ratios of fat compared to muscle mass. Too much body fat puts you at increased risk for cardiovascular disease, heart attack, stroke, type 2 diabetes, and obesity-related cancers. In addition to exercise, following the [place] can help reduce your risk.  Decoding Lab Results  Clinical staff conducted group or individual video education with verbal and written material and guidebook.  Patient learns that lab test reflects one measurement whose values change over time and are influenced by many factors, including medication, stress, sleep, exercise, food, hydration, pre-existing medical conditions, and more. It is recommended to use the knowledge from this video to become more involved with your lab results and evaluate your numbers to speak with your doctor.   Diseases of Our Time - Overview  Clinical staff conducted group or individual video education with verbal and written material and guidebook.  Patient learns that according to the CDC, 50% to 70% of chronic diseases (such as obesity, type 2 diabetes, elevated lipids, hypertension, and heart disease) are avoidable through lifestyle improvements including healthier food choices, listening to satiety cues, and increased physical activity.  Sleep Disorders Clinical staff conducted group or individual  video education with verbal and written material and guidebook.  Patient learns how good quality and duration of sleep are important to overall health and well-being. Patient also learns about sleep disorders and how they impact health along with recommendations to address them, including  discussing with a physician.  Nutrition  Dining Out - Part 2 Clinical staff conducted group or individual video education with verbal and written material and guidebook.  Patient learns how to plan ahead and communicate in order to maximize their dining experience in a healthy and nutritious manner. Included are recommended food choices based on the type of restaurant the patient is visiting.   Fueling a Best boy conducted group or individual video education with verbal and written material and guidebook.  There is a strong connection between our food choices and our health. Diseases like obesity and type 2 diabetes are very prevalent and are in large-part due to lifestyle choices. The Pritikin Eating Plan provides plenty of food and hunger-curbing satisfaction. It is easy to follow, affordable, and helps reduce health risks.  Menu Workshop  Clinical staff conducted group or individual video education with verbal and written material and guidebook.  Patient learns that restaurant meals can sabotage health goals because they are often packed with calories, fat, sodium, and sugar. Recommendations include strategies to plan ahead and to communicate with the manager, chef, or server to help order a healthier meal.  Planning Your Eating Strategy  Clinical staff conducted group or individual video education with verbal and written material and guidebook.  Patient learns about the [place] and its benefit of reducing the risk of disease. The [place] does not focus on calories. Instead, it emphasizes high-quality, nutrient-rich foods. By knowing the characteristics of the foods, we choose, we can determine their calorie density and make informed decisions.  Targeting Your Nutrition Priorities  Clinical staff conducted group or individual video education with verbal and written material and guidebook.  Patient learns that lifestyle habits have a tremendous  impact on disease risk and progression. This video provides eating and physical activity recommendations based on your personal health goals, such as reducing LDL cholesterol, losing weight, preventing or controlling type 2 diabetes, and reducing high blood pressure.  Vitamins and Minerals  Clinical staff conducted group or individual video education with verbal and written material and guidebook.  Patient learns different ways to obtain key vitamins and minerals, including through a recommended healthy diet. It is important to discuss all supplements you take with your doctor.   Healthy Mind-Set    Smoking Cessation  Clinical staff conducted group or individual video education with verbal and written material and guidebook.  Patient learns that cigarette smoking and tobacco addiction pose a serious health risk which affects millions of people. Stopping smoking will significantly reduce the risk of heart disease, lung disease, and many forms of cancer. Recommended strategies for quitting are covered, including working with your doctor to develop a successful plan.  Culinary   Becoming a Financial trader conducted group or individual video education with verbal and written material and guidebook.  Patient learns that cooking at home can be healthy, cost-effective, quick, and puts them in control. Keys to cooking healthy recipes will include looking at your recipe, assessing your equipment needs, planning ahead, making it simple, choosing cost-effective seasonal ingredients, and limiting the use of added fats, salts, and sugars.  Cooking - Breakfast and Snacks  Clinical staff conducted group or individual  video education with verbal and written material and guidebook.  Patient learns how important breakfast is to satiety and nutrition through the entire day. Recommendations include key foods to eat during breakfast to help stabilize blood sugar levels and to prevent overeating at meals  later in the day. Planning ahead is also a key component.  Cooking - Human resources officer conducted group or individual video education with verbal and written material and guidebook.  Patient learns eating strategies to improve overall health, including an approach to cook more at home. Recommendations include thinking of animal protein as a side on your plate rather than center stage and focusing instead on lower calorie dense options like vegetables, fruits, whole grains, and plant-based proteins, such as beans. Making sauces in large quantities to freeze for later and leaving the skin on your vegetables are also recommended to maximize your experience.  Cooking - Healthy Salads and Dressing Clinical staff conducted group or individual video education with verbal and written material and guidebook.  Patient learns that vegetables, fruits, whole grains, and legumes are the foundations of the [place]. Recommendations include how to incorporate each of these in flavorful and healthy salads, and how to create homemade salad dressings. Proper handling of ingredients is also covered. Cooking - Soups and [employer] - Soups and Desserts Clinical staff conducted group or individual video education with verbal and written material and guidebook.  Patient learns that Pritikin soups and desserts make for easy, nutritious, and delicious snacks and meal components that are low in sodium, fat, sugar, and calorie density, while high in vitamins, minerals, and filling fiber. Recommendations include simple and healthy ideas for soups and desserts.   Overview     The Pritikin Solution Program Overview Clinical staff conducted group or individual video education with verbal and written material and guidebook.  Patient learns that the results of the [place] Program have been documented in more than 100 articles published in peer-reviewed journals, and the benefits include reducing  risk factors for (and, in some cases, even reversing) high cholesterol, high blood pressure, type 2 diabetes, obesity, and more! An overview of the three key pillars of the Pritikin Program will be covered: eating well, doing regular exercise, and having a healthy mind-set.  WORKSHOPS  Exercise: Exercise Basics: Building Your Action Plan Clinical staff led group instruction and group discussion with PowerPoint presentation and patient guidebook. To enhance the learning environment the use of posters, models and videos may be added. At the conclusion of this workshop, patients will comprehend the difference between physical activity and exercise, as well as the benefits of incorporating both, into their routine. Patients will understand the FITT (Frequency, Intensity, Time, and Type) principle and how to use it to build an exercise action plan. In addition, safety concerns and other considerations for exercise and cardiac rehab will be addressed by the presenter. The purpose of this lesson is to promote a comprehensive and effective weekly exercise routine in order to improve patients' overall level of fitness.   Managing Heart Disease: Your Path to a Healthier Heart Clinical staff led group instruction and group discussion with PowerPoint presentation and patient guidebook. To enhance the learning environment the use of posters, models and videos may be added.At the conclusion of this workshop, patients will understand the anatomy and physiology of the heart. Additionally, they will understand how Pritikin's three pillars impact the risk factors, the progression, and the management of heart disease.  The purpose of  this lesson is to provide a high-level overview of the heart, heart disease, and how the Pritikin lifestyle positively impacts risk factors.  Exercise Biomechanics Clinical staff led group instruction and group discussion with PowerPoint presentation and patient guidebook. To enhance  the learning environment the use of posters, models and videos may be added. Patients will learn how the structural parts of their bodies function and how these functions impact their daily activities, movement, and exercise. Patients will learn how to promote a neutral spine, learn how to manage pain, and identify ways to improve their physical movement in order to promote healthy living. The purpose of this lesson is to expose patients to common physical limitations that impact physical activity. Participants will learn practical ways to adapt and manage aches and pains, and to minimize their effect on regular exercise. Patients will learn how to maintain good posture while sitting, walking, and lifting.  Balance Training and Fall Prevention  Clinical staff led group instruction and group discussion with PowerPoint presentation and patient guidebook. To enhance the learning environment the use of posters, models and videos may be added. At the conclusion of this workshop, patients will understand the importance of their sensorimotor skills (vision, proprioception, and the vestibular system) in maintaining their ability to balance as they age. Patients will apply a variety of balancing exercises that are appropriate for their current level of function. Patients will understand the common causes for poor balance, possible solutions to these problems, and ways to modify their physical environment in order to minimize their fall risk. The purpose of this lesson is to teach patients about the importance of maintaining balance as they age and ways to minimize their risk of falling.  WORKSHOPS   Nutrition:  Fueling a Scientist, research (physical sciences) led group instruction and group discussion with PowerPoint presentation and patient guidebook. To enhance the learning environment the use of posters, models and videos may be added. Patients will review the foundational principles of the [place]  and understand what constitutes a serving size in each of the food groups. Patients will also learn Pritikin-friendly foods that are better choices when away from home and review make-ahead meal and snack options. Calorie density will be reviewed and applied to three nutrition priorities: weight maintenance, weight loss, and weight gain. The purpose of this lesson is to reinforce (in a group setting) the key concepts around what patients are recommended to eat and how to apply these guidelines when away from home by planning and selecting Pritikin-friendly options. Patients will understand how calorie density may be adjusted for different weight management goals.  Mindful Eating  Clinical staff led group instruction and group discussion with PowerPoint presentation and patient guidebook. To enhance the learning environment the use of posters, models and videos may be added. Patients will briefly review the concepts of the [place] and the importance of low-calorie dense foods. The concept of mindful eating will be introduced as well as the importance of paying attention to internal hunger signals. Triggers for non-hunger eating and techniques for dealing with triggers will be explored. The purpose of this lesson is to provide patients with the opportunity to review the basic principles of the [place], discuss the value of eating mindfully and how to measure internal cues of hunger and fullness using the Hunger Scale. Patients will also discuss reasons for non-hunger eating and learn strategies to use for controlling emotional eating.  Targeting Your Nutrition Priorities Clinical staff led group instruction  and group discussion with PowerPoint presentation and patient guidebook. To enhance the learning environment the use of posters, models and videos may be added. Patients will learn how to determine their genetic susceptibility to disease by reviewing their family history. Patients  will gain insight into the importance of diet as part of an overall healthy lifestyle in mitigating the impact of genetics and other environmental insults. The purpose of this lesson is to provide patients with the opportunity to assess their personal nutrition priorities by looking at their family history, their own health history and current risk factors. Patients will also be able to discuss ways of prioritizing and modifying the [place] for their highest risk areas  Menu  Clinical staff led group instruction and group discussion with PowerPoint presentation and patient guidebook. To enhance the learning environment the use of posters, models and videos may be added. Using menus brought in from [employer], or printed from [employer], patients will apply the [place] dining out guidelines that were presented in the [employer] video. Patients will also be able to practice these guidelines in a variety of provided scenarios. The purpose of this lesson is to provide patients with the opportunity to practice hands-on learning of the [place] with actual menus and practice scenarios.  Label Reading Clinical staff led group instruction and group discussion with PowerPoint presentation and patient guidebook. To enhance the learning environment the use of posters, models and videos may be added. Patients will review and discuss the Pritikin label reading guidelines presented in Pritikin's Label Reading Educational series video. Using fool labels brought in from local grocery stores and markets, patients will apply the label reading guidelines and determine if the packaged food meet the Pritikin guidelines. The purpose of this lesson is to provide patients with the opportunity to review, discuss, and practice hands-on learning of the Pritikin Label Reading guidelines with actual packaged food labels. [place] Workshops  are designed to teach patients ways to prepare quick, simple, and affordable recipes at home. The importance of nutrition's role in chronic disease risk reduction is reflected in its emphasis in the overall Pritikin program. By learning how to prepare essential core Pritikin Eating Plan recipes, patients will increase control over what they eat; be able to customize the flavor of foods without the use of added salt, sugar, or fat; and improve the quality of the food they consume. By learning a set of core recipes which are easily assembled, quickly prepared, and affordable, patients are more likely to prepare more healthy foods at home. These workshops focus on convenient breakfasts, simple entres, side dishes, and desserts which can be prepared with minimal effort and are consistent with nutrition recommendations for cardiovascular risk reduction. Cooking [employer] are taught by a Engineer, materials (RD) who has been trained by the [employer]. The chef or RD has a clear understanding of the importance of minimizing - if not completely eliminating - added fat, sugar, and sodium in recipes. Throughout the series of [place] Workshop sessions, patients will learn about healthy ingredients and efficient methods of cooking to build confidence in their capability to prepare    Cooking School weekly topics:  Adding Flavor- Sodium-Free  Fast and Healthy Breakfasts  Powerhouse Plant-Based Proteins  Satisfying Salads and Dressings  Simple Sides and Sauces  International Cuisine-Spotlight on the [place] Zones  Delicious Desserts  Savory Soups  [employer] - Meals in  a Snap  Tasty Appetizers and Snacks  Comforting Weekend Breakfasts  One-Pot Wonders   Fast Evening Meals  Easy Entertaining  Personalizing Your Pritikin Plate  WORKSHOPS   Healthy Mindset (Psychosocial): New Thoughts, New Behaviors Clinical staff led group instruction and group discussion with  PowerPoint presentation and patient guidebook. To enhance the learning environment the use of posters, models and videos may be added. Patients will learn and practice techniques for developing effective health and lifestyle goals. Patients will be able to effectively apply the goal setting process learned to develop at least one new personal goal.  The purpose of this lesson is to expose patients to a new skill set of behavior modification techniques such as techniques setting SMART goals, overcoming barriers, and achieving new thoughts and new behaviors.  Managing Moods and Relationships Clinical staff led group instruction and group discussion with PowerPoint presentation and patient guidebook. To enhance the learning environment the use of posters, models and videos may be added. Patients will learn how emotional and chronic stress factors can impact their health and relationships. They will learn healthy ways to manage their moods and utilize positive coping mechanisms. In addition, ICR patients will learn ways to improve communication skills. The purpose of this lesson is to expose patients to ways of understanding how one's mood and health are intimately connected. Developing a healthy outlook can help build positive relationships and connections with others. Patients will understand the importance of utilizing effective communication skills that include actively listening and being heard. They will learn and understand the importance of the "4 Cs" and especially Connections in fostering of a Healthy Mind-Set.  Healthy Sleep for a Healthy Heart Clinical staff led group instruction and group discussion with PowerPoint presentation and patient guidebook. To enhance the learning environment the use of posters, models and videos may be added. At the conclusion of this workshop, patients will be able to demonstrate knowledge of the importance of sleep to overall health, well-being, and quality of life. They  will understand the symptoms of, and treatments for, common sleep disorders. Patients will also be able to identify daytime and nighttime behaviors which impact sleep, and they will be able to apply these tools to help manage sleep-related challenges. The purpose of this lesson is to provide patients with a general overview of sleep and outline the importance of quality sleep. Patients will learn about a few of the most common sleep disorders. Patients will also be introduced to the concept of "sleep hygiene," and discover ways to self-manage certain sleeping problems through simple daily behavior changes. Finally, the workshop will motivate patients by clarifying the links between quality sleep and their goals of heart-healthy living.   Recognizing and Reducing Stress Clinical staff led group instruction and group discussion with PowerPoint presentation and patient guidebook. To enhance the learning environment the use of posters, models and videos may be added. At the conclusion of this workshop, patients will be able to understand the types of stress reactions, differentiate between acute and chronic stress, and recognize the impact that chronic stress has on their health. They will also be able to apply different coping mechanisms, such as reframing negative self-talk. Patients will have the opportunity to practice a variety of stress management techniques, such as deep abdominal breathing, progressive muscle relaxation, and/or guided imagery.  The purpose of this lesson is to educate patients on the role of stress in their lives and to provide healthy techniques for coping with it.  Learning Barriers/Preferences:  Learning  Barriers/Preferences - 03/26/22 1252       Learning Barriers/Preferences   Learning Barriers Sight;Hearing   wears glasses. hearing loss in right ear. Does not wear a heariing aide   Learning Preferences Audio;Group Instruction;Individual Instruction;Pictoral;Written  Material;Verbal Instruction;Skilled Demonstration             Education Topics:  Knowledge Questionnaire Score:  Knowledge Questionnaire Score - 03/26/22 1139       Knowledge Questionnaire Score   Pre Score 20/24             Core Components/Risk Factors/Patient Goals at Admission:  Personal Goals and Risk Factors at Admission - 03/26/22 0922       Core Components/Risk Factors/Patient Goals on Admission    Weight Management Weight Maintenance;Yes    Intervention Weight Management: Develop a combined nutrition and exercise program designed to reach desired caloric intake, while maintaining appropriate intake of nutrient and fiber, sodium and fats, and appropriate energy expenditure required for the weight goal.;Weight Management: Provide education and appropriate resources to help participant work on and attain dietary goals.    Expected Outcomes Short Term: Continue to assess and modify interventions until short term weight is achieved;Long Term: Adherence to nutrition and physical activity/exercise program aimed toward attainment of established weight goal;Weight Maintenance: Understanding of the daily nutrition guidelines, which includes 25-35% calories from fat, 7% or less cal from saturated fats, less than '200mg'$  cholesterol, less than 1.5gm of sodium, & 5 or more servings of fruits and vegetables daily;Understanding recommendations for meals to include 15-35% energy as protein, 25-35% energy from fat, 35-60% energy from carbohydrates, less than '200mg'$  of dietary cholesterol, 20-35 gm of total fiber daily    Hypertension Yes    Intervention Provide education on lifestyle modifcations including regular physical activity/exercise, weight management, moderate sodium restriction and increased consumption of fresh fruit, vegetables, and low fat dairy, alcohol moderation, and smoking cessation.;Monitor prescription use compliance.    Expected Outcomes Short Term: Continued assessment and  intervention until BP is < 140/64m HG in hypertensive participants. < 130/89mHG in hypertensive participants with diabetes, heart failure or chronic kidney disease.;Long Term: Maintenance of blood pressure at goal levels.    Lipids Yes    Intervention Provide education and support for participant on nutrition & aerobic/resistive exercise along with prescribed medications to achieve LDL '70mg'$ , HDL >'40mg'$ .    Expected Outcomes Short Term: Participant states understanding of desired cholesterol values and is compliant with medications prescribed. Participant is following exercise prescription and nutrition guidelines.;Long Term: Cholesterol controlled with medications as prescribed, with individualized exercise RX and with personalized nutrition plan. Value goals: LDL < '70mg'$ , HDL > 40 mg.    Personal Goal Other Yes    Personal Goal Regain independence, not need to use cane. Be able to walk farther. Be able to perform ADLs. Get back to cooking. Have confidence to go on day excursions.    Intervention Provide individualized exercise prescription including aerobic, resistance, and stretching exercises to help increase strength and cardiorespiratory fitness, so that patient will have confidence and ability to do activities of daily living, exercise, and leisure activities.    Expected Outcomes Patient will be able to resume exercise activities, lesiure activities, and activites of daily living with confidence. Patient will be able to walk farther as measured by six-minute walk test.             Core Components/Risk Factors/Patient Goals Review:   Goals and Risk Factor Review     Row Name 04/01/22 1444  04/16/22 1535 05/10/22 1353         Core Components/Risk Factors/Patient Goals Review   Personal Goals Review Weight Management/Obesity;Improve shortness of breath with ADL's;Lipids;Hypertension Weight Management/Obesity;Improve shortness of breath with ADL's;Lipids;Hypertension Weight  Management/Obesity;Improve shortness of breath with ADL's;Lipids;Hypertension (P)      Review Kailie started intensive caridac rehab on 04/01/22. Shianne did fair with exercise as she is somewhat deconditoned. Vital signs were stable. Cristopher Estimable did report feeling lightheade on second piece of equipment. Resolved after rest and water was given Topaz is making slow improvemnt. Quintavia has stopped using her cane as she has been feeling stronger. Vital signs and oxygen saturations have been stable Dezlynn is making slow improvemnt. Alyda has stopped using her cane as she has been feeling stronger. Vital signs and oxygen saturations have been stable (P)      Expected Outcomes Derinda will conitnue to participate in intensive cardiac rehab for exercise, nutrition and lifestyle modifications. Lilas will conitnue to participate in intensive cardiac rehab for exercise, nutrition and lifestyle modifications. Nancee will conitnue to participate in intensive cardiac rehab for exercise, nutrition and lifestyle modifications. (P)               Core Components/Risk Factors/Patient Goals at Discharge (Final Review):   Goals and Risk Factor Review - 05/10/22 1353       Core Components/Risk Factors/Patient Goals Review   Personal Goals Review Weight Management/Obesity;Improve shortness of breath with ADL's;Lipids;Hypertension (P)     Review Arther is making slow improvemnt. Meygan has stopped using her cane as she has been feeling stronger. Vital signs and oxygen saturations have been stable (P)     Expected Outcomes Sylinda will conitnue to participate in intensive cardiac rehab for exercise, nutrition and lifestyle modifications. (P)              ITP Comments:  ITP Comments     Row Name 03/26/22 0922 04/01/22 1440 04/16/22 1531 05/10/22 1351     ITP Comments Medical Director- Dr. Fransico Him, MD, Introduction to Pritikin Education [place]/ Intensive Cardiac Rehab. Initial  Orientation packet reviewed with the patient 30 Day ITP Review. Sevanah started intensive cardiac rehab on 04/01/22. Verene did fair with exercise as she is somewhat deconditoned. 30 Day ITP Review. Bijou started intensive cardiac rehab on 04/01/22. Biancca is slowly improving her stamina and is doing well with exercise. 30 Day ITP Review. Elainie has good attendance and participation in  intensive cardiac rehab. Rhiannan is doing well with exercise             Comments: See ITP Comments

## 2022-05-13 ENCOUNTER — Encounter (HOSPITAL_COMMUNITY)
Admission: RE | Admit: 2022-05-13 | Discharge: 2022-05-13 | Disposition: A | Discharge status: Home / Self Care | Payer: Medicare Other | Source: Ambulatory Visit | Attending: Cardiovascular Disease | Admitting: Cardiovascular Disease

## 2022-05-13 DIAGNOSIS — D860 Sarcoidosis of lung: Secondary | ICD-10-CM | POA: Diagnosis not present

## 2022-05-13 DIAGNOSIS — Z951 Presence of aortocoronary bypass graft: Secondary | ICD-10-CM

## 2022-05-15 ENCOUNTER — Encounter (HOSPITAL_COMMUNITY)
Admission: RE | Admit: 2022-05-15 | Discharge: 2022-05-15 | Disposition: A | Discharge status: Home / Self Care | Payer: Medicare Other | Source: Ambulatory Visit | Attending: Cardiovascular Disease | Admitting: Cardiovascular Disease

## 2022-05-15 ENCOUNTER — Encounter: Payer: Self-pay | Admitting: Nurse Practitioner

## 2022-05-15 DIAGNOSIS — R300 Dysuria: Secondary | ICD-10-CM

## 2022-05-15 DIAGNOSIS — D860 Sarcoidosis of lung: Secondary | ICD-10-CM | POA: Diagnosis not present

## 2022-05-15 DIAGNOSIS — Z951 Presence of aortocoronary bypass graft: Secondary | ICD-10-CM

## 2022-05-16 ENCOUNTER — Other Ambulatory Visit: Payer: Medicare Other

## 2022-05-16 DIAGNOSIS — R300 Dysuria: Secondary | ICD-10-CM

## 2022-05-16 NOTE — Progress Notes (Signed)
Pt is here to provide urine sample    order was placed via telephone encounter but was not in future. Lab was re-ordered to be released.

## 2022-05-17 ENCOUNTER — Encounter (HOSPITAL_COMMUNITY): Payer: Medicare Other

## 2022-05-18 LAB — URINALYSIS W MICROSCOPIC + REFLEX CULTURE
Bilirubin Urine: NEGATIVE
Hyaline Cast: NONE SEEN /LPF
Ketones, ur: NEGATIVE
Nitrites, Initial: NEGATIVE
Specific Gravity, Urine: 1.024 (ref 1.001–1.035)
pH: 5.5 (ref 5.0–8.0)

## 2022-05-18 LAB — CULTURE INDICATED

## 2022-05-18 LAB — URINE CULTURE
MICRO NUMBER:: 14635831
SPECIMEN QUALITY:: ADEQUATE

## 2022-05-20 ENCOUNTER — Encounter (HOSPITAL_COMMUNITY)
Admission: RE | Admit: 2022-05-20 | Discharge: 2022-05-20 | Disposition: A | Discharge status: Home / Self Care | Payer: Medicare Other | Source: Ambulatory Visit | Attending: Cardiovascular Disease | Admitting: Cardiovascular Disease

## 2022-05-20 DIAGNOSIS — D860 Sarcoidosis of lung: Secondary | ICD-10-CM | POA: Insufficient documentation

## 2022-05-20 DIAGNOSIS — Z48812 Encounter for surgical aftercare following surgery on the circulatory system: Secondary | ICD-10-CM | POA: Insufficient documentation

## 2022-05-20 DIAGNOSIS — Z951 Presence of aortocoronary bypass graft: Secondary | ICD-10-CM | POA: Diagnosis not present

## 2022-05-20 MED ORDER — URIBEL 81.6 MG PO TABS: 1.0000 | ORAL_TABLET | Freq: Every morning | ORAL | 0 refills | Status: DC

## 2022-05-20 NOTE — Addendum Note (Signed)
Addended by: Wilfred Lacy L on: 05/20/2022 10:03 AM   Modules accepted: Orders

## 2022-05-22 ENCOUNTER — Other Ambulatory Visit: Payer: Self-pay | Admitting: *Deleted

## 2022-05-22 ENCOUNTER — Encounter (HOSPITAL_COMMUNITY)
Admission: RE | Admit: 2022-05-22 | Discharge: 2022-05-22 | Disposition: A | Discharge status: Home / Self Care | Payer: Medicare Other | Source: Ambulatory Visit | Attending: Cardiovascular Disease | Admitting: Cardiovascular Disease

## 2022-05-22 DIAGNOSIS — D860 Sarcoidosis of lung: Secondary | ICD-10-CM

## 2022-05-22 DIAGNOSIS — Z951 Presence of aortocoronary bypass graft: Secondary | ICD-10-CM

## 2022-05-22 NOTE — Progress Notes (Signed)
Kristen Suarez with Cardiac Rehab contacted the office with patient c/o sternal pain. Patient contacted. 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 Patient states pain is a deep ache in the center of her chest. Patient states pain has improved slightly over time but has not gone away. Per patient, she is taking Tylenol 3 times daily. Denies specific movement causing the pain. Advised a cxr can be ordered and follow up with Dr. Tenny Craw can be scheduled if patient would like to further discuss pain. Patient would like that. Scheduled for tomorrow. Patient aware.

## 2022-05-22 NOTE — Progress Notes (Signed)
Kristen Suarez reports that she is still experiencing mid sternal soreness/ pain. Kristen Suarez says the soreness has improved some since surgery. Kristen Suarez says she notices the soreness when she moves her arms in a upward position. Kristen Suarez denies noticing any clicking. Mid sternal incision is well healed. Will notify Dr  Binnie Kand office on the patient's behalf to report. Kristen Suarez has been doing well with exercise at cardiac rehab.Harrell Gave RN BSN

## 2022-05-23 ENCOUNTER — Other Ambulatory Visit: Payer: Self-pay

## 2022-05-23 ENCOUNTER — Ambulatory Visit
Admission: RE | Admit: 2022-05-23 | Discharge: 2022-05-23 | Disposition: A | Discharge status: Home / Self Care | Payer: Medicare Other | Source: Ambulatory Visit | Attending: Cardiothoracic Surgery | Admitting: Cardiothoracic Surgery

## 2022-05-23 ENCOUNTER — Ambulatory Visit (INDEPENDENT_AMBULATORY_CARE_PROVIDER_SITE_OTHER): Payer: Medicare Other | Admitting: Cardiothoracic Surgery

## 2022-05-23 ENCOUNTER — Other Ambulatory Visit (INDEPENDENT_AMBULATORY_CARE_PROVIDER_SITE_OTHER): Payer: Medicare Other

## 2022-05-23 VITALS — BP 148/75 | HR 72 | Resp 20 | Ht 63.5 in | Wt 130.0 lb

## 2022-05-23 DIAGNOSIS — E782 Mixed hyperlipidemia: Secondary | ICD-10-CM | POA: Diagnosis not present

## 2022-05-23 DIAGNOSIS — R300 Dysuria: Secondary | ICD-10-CM | POA: Diagnosis not present

## 2022-05-23 DIAGNOSIS — I10 Essential (primary) hypertension: Secondary | ICD-10-CM

## 2022-05-23 DIAGNOSIS — R931 Abnormal findings on diagnostic imaging of heart and coronary circulation: Secondary | ICD-10-CM

## 2022-05-23 DIAGNOSIS — Z951 Presence of aortocoronary bypass graft: Secondary | ICD-10-CM

## 2022-05-23 DIAGNOSIS — N390 Urinary tract infection, site not specified: Secondary | ICD-10-CM

## 2022-05-23 LAB — LIPID PANEL
Cholesterol: 104 mg/dL (ref 0–200)
HDL: 45.2 mg/dL (ref >39.00)
LDL Cholesterol: 28 mg/dL (ref 0–99)
NonHDL: 58.83
Total CHOL/HDL Ratio: 2
Triglycerides: 154 mg/dL — ABNORMAL HIGH (ref 0.0–149.0)
VLDL: 30.8 mg/dL (ref 0.0–40.0)

## 2022-05-23 LAB — HEPATIC FUNCTION PANEL
ALT: 21 U/L (ref 0–35)
AST: 23 U/L (ref 0–37)
Albumin: 4 g/dL (ref 3.5–5.2)
Alkaline Phosphatase: 128 U/L — ABNORMAL HIGH (ref 39–117)
Bilirubin, Direct: 0.1 mg/dL (ref 0.0–0.3)
Total Bilirubin: 0.3 mg/dL (ref 0.2–1.2)
Total Protein: 6.7 g/dL (ref 6.0–8.3)

## 2022-05-23 NOTE — Progress Notes (Signed)
Pt is here for labs

## 2022-05-23 NOTE — Progress Notes (Addendum)
      [address]       [place] [address]             [phone]                    Kristen Suarez [place] Medical Record [id number] Date of Birth: [birth year]  Referring: Troy Sine, MD Primary Care: Flossie Buffy, NP Primary Cardiologist: Skeet Latch, MD  Doing reasonably s/p CAB / LAA clip 01/04/22   2 weeks left in cardiac rehab  Told them she's having pain with arm movements  They wonder should she still be in rehab  On her own doing exercises to stretch her chest with a large pad centrally behind back and stretching chest signicantly - told her not to do this, but lying flat is fine. Can resume this at [facility] surgery  PHYSICAL EXAMINATION: BP (!) 148/75   Pulse 72   Resp 20   Ht 5' 3.5" (1.613 m)   Wt 130 lb (59 kg)   SpO2 93% Comment: RA  BMI 22.67 kg/m   Gen: NAD Neuro: Alert and oriented Chest: incision healed. No movement of sternum with cough. No sign of sternal instability Resp: Nonlaboured Abd: Soft, ntnd Extr: WWP, no LE edema bilateral  Diagnostic Studies & Laboratory data:    XR today: Lungs well inflated.   Assessment / Plan:   Doing reasonably s/p CAB / LAA clip 01/04/22   Incisions well healed.  Is on ASA Plan to see back prn Can stop arm exercises for 1 month Keep on tylenol.  Didn't want something centrally acting per patient, MD recommended lidocaine patch to area    She asked if it could be any meds that she is not energetic 100% yet. I told her some patients feel low energy on metoprolol, a beta blocker. There may be future considerations of reducing BB dose and increasing another med for BP, amlodipine for instance.   DPierre BaliEnter 05/23/2022 1:01 PM

## 2022-05-24 ENCOUNTER — Encounter (HOSPITAL_COMMUNITY)
Admission: RE | Admit: 2022-05-24 | Discharge: 2022-05-24 | Disposition: A | Discharge status: Home / Self Care | Payer: Medicare Other | Source: Ambulatory Visit | Attending: Cardiovascular Disease | Admitting: Cardiovascular Disease

## 2022-05-24 DIAGNOSIS — Z951 Presence of aortocoronary bypass graft: Secondary | ICD-10-CM | POA: Diagnosis not present

## 2022-05-24 DIAGNOSIS — D860 Sarcoidosis of lung: Secondary | ICD-10-CM

## 2022-05-24 LAB — URINALYSIS, ROUTINE W REFLEX MICROSCOPIC
Bilirubin Urine: NEGATIVE
Ketones, ur: NEGATIVE
Nitrite: NEGATIVE
Specific Gravity, Urine: 1.02 (ref 1.000–1.030)
Total Protein, Urine: 30 — AB
Urine Glucose: 500 — AB
Urobilinogen, UA: 0.2 (ref 0.0–1.0)
pH: 6 (ref 5.0–8.0)

## 2022-05-24 NOTE — Progress Notes (Signed)
Persistent abnormal urinalysis. F/up with urology. Mild elevated in ALP: hepatic enzyme. Repeat labs in 19month(fasting)

## 2022-05-24 NOTE — Addendum Note (Signed)
Addended by: Leana Gamer on: 05/24/2022 04:21 PM   Modules accepted: Orders

## 2022-05-27 ENCOUNTER — Encounter (HOSPITAL_COMMUNITY)
Admission: RE | Admit: 2022-05-27 | Discharge: 2022-05-27 | Disposition: A | Discharge status: Home / Self Care | Payer: Medicare Other | Source: Ambulatory Visit | Attending: Cardiovascular Disease | Admitting: Cardiovascular Disease

## 2022-05-27 VITALS — BP 130/76 | HR 69 | Ht 63.5 in | Wt 131.2 lb

## 2022-05-27 DIAGNOSIS — Z951 Presence of aortocoronary bypass graft: Secondary | ICD-10-CM | POA: Diagnosis not present

## 2022-05-27 DIAGNOSIS — D860 Sarcoidosis of lung: Secondary | ICD-10-CM

## 2022-05-27 NOTE — Progress Notes (Signed)
Discharge Progress Report  Patient Details  Name: Kristen Suarez MRN: [id number] Date of Birth: Aug 21, 1945 Referring Provider:   Flowsheet Row INTENSIVE CARDIAC REHAB ORIENT from 03/26/2022 in [facility]  Referring Provider Skeet Latch, MD        Number of Visits: 48  Reason for Discharge:  Patient reached a stable level of exercise. Patient independent in their exercise. Patient has met program and personal goals.  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  Sarcoidosis of lung ([place])  ADL [facility]:   Initial Exercise Prescription:  Initial Exercise Prescription - 03/26/22 1100       Date of Initial Exercise RX and Referring Provider   Date 03/26/22    Referring Provider Skeet Latch, MD    Expected Discharge Date 05/24/22      Recumbant Bike   Level 1    Minutes 15    METs 1.9      NuStep   Level 1    SPM 85    Minutes 15    METs 1.9      Prescription Details   Frequency (times per week) 3    Duration Progress to 30 minutes of continuous aerobic without signs/symptoms of physical distress      Intensity   THRR 40-80% of Max Heartrate 58-115    Ratings of Perceived Exertion 11-13    Perceived Dyspnea 0-4      Progression   Progression Continue to progress workloads to maintain intensity without signs/symptoms of physical distress.      Resistance Training   Training Prescription Yes    Weight 2 lbs    Reps 10-15             Discharge Exercise Prescription (Final Exercise Prescription Changes):  Exercise Prescription Changes - 05/27/22 1022       Response to Exercise   Blood Pressure (Admit) 130/76    Blood Pressure (Exercise) 144/78    Blood Pressure (Exit) 144/70    Heart Rate (Admit) 69 bpm    Heart Rate (Exercise) 86 bpm    Heart Rate (Exit) 69 bpm     Oxygen Saturation (Exercise) 95 %    Rating of Perceived Exertion (Exercise) 13    Perceived Dyspnea (Exercise) 2    Symptoms Mild SOB    Comments Patient completed the cardiac rehab program.    Duration Progress to 30 minutes of  aerobic without signs/symptoms of physical distress    Intensity THRR unchanged      Progression   Progression Continue to progress workloads to maintain intensity without signs/symptoms of physical distress.    Average METs 1.8      Resistance Training   Training Prescription Yes    Weight 2 lbs    Reps 10-15    Time 10 Minutes      Interval Training   Interval Training No      Recumbant Bike   Level 1    Minutes 15    METs 1.7      NuStep   Level 3    SPM 67    Minutes 15    METs 2      Home Exercise Plan   Plans to continue exercise at [employer] (comment)    Frequency Add 2 additional days to program exercise  sessions.    Initial Home Exercises Provided 05/06/22             Functional Capacity:  6 Minute Walk     Row Name 03/26/22 1011 05/24/22 1039       6 Minute Walk   Phase Initial Discharge    Distance 954 feet 1175 feet    Distance % Change -- 23.17 %    Distance Feet Change -- 221 ft    Walk Time 6 minutes 6 minutes    # of Rest Breaks 0 0    MPH 1.81 2.22    METS 2.26 2.74    RPE 13 13    Perceived Dyspnea  1 2    VO2 Peak 7.92 9.59    Symptoms Yes (comment) Yes (comment)    Comments Patient c/o mild SOB, RPD=1. Patient c/o mild SOB, RPD=2, and left side/rib cage pain, 2/10 resolved with rest.    Resting HR 78 bpm 67 bpm    Resting BP 138/72 112/62    Resting Oxygen Saturation  98 % 95 %    Exercise Oxygen Saturation  during 6 min walk 93 % 93 %    Max Ex. HR 96 bpm 93 bpm    Max Ex. BP 158/72 172/84    2 Minute Post BP 128/82 130/70             Psychological, QOL, Others - Outcomes: PHQ 2/9:    05/27/2022   11:14 AM 03/26/2022   11:41 AM 02/25/2022    9:20 AM 02/13/2021    1:48 PM  02/13/2021    1:39 PM  Depression screen PHQ 2/9  Decreased Interest 0 0 0 0 0  Down, Depressed, Hopeless 0 0 0 0 0  PHQ - 2 Score 0 0 0 0 0  Altered sleeping 0 2     Tired, decreased energy 1 2     Change in appetite 0 0     Feeling bad or failure about yourself  0 0     Trouble concentrating 0 2     Moving slowly or fidgety/restless 0 0     Suicidal thoughts 0 0     PHQ-9 Score 1 6     Difficult doing work/chores  Somewhat difficult       Quality of Life:  Quality of Life - 05/22/22 0827       Quality of Life   Select Quality of Life      Quality of Life Scores   Health/Function Pre 23.88 %    Health/Function Post 24.3 %    Health/Function % Change 1.76 %    Socioeconomic Pre 28.57 %    Socioeconomic Post 28.21 %    Socioeconomic % Change  -1.26 %    Psych/Spiritual Pre 28.29 %    Psych/Spiritual Post 24.64 %    Psych/Spiritual % Change -12.9 %    Family Pre 30 %    Family Post 27.6 %    Family % Change -8 %    GLOBAL Pre 26.83 %    GLOBAL Post 25.66 %    GLOBAL % Change -4.36 %             Personal Goals: Goals established at orientation with interventions provided to work toward goal.  Personal Goals and Risk Factors at Admission - 03/26/22 0922       Core Components/Risk Factors/Patient Goals on Admission    Weight Management Weight Maintenance;Yes    Intervention  Weight Management: Develop a combined nutrition and exercise program designed to reach desired caloric intake, while maintaining appropriate intake of nutrient and fiber, sodium and fats, and appropriate energy expenditure required for the weight goal.;Weight Management: Provide education and appropriate resources to help participant work on and attain dietary goals.    Expected Outcomes Short Term: Continue to assess and modify interventions until short term weight is achieved;Long Term: Adherence to nutrition and physical activity/exercise program aimed toward attainment of established weight  goal;Weight Maintenance: Understanding of the daily nutrition guidelines, which includes 25-35% calories from fat, 7% or less cal from saturated fats, less than '200mg'$  cholesterol, less than 1.5gm of sodium, & 5 or more servings of fruits and vegetables daily;Understanding recommendations for meals to include 15-35% energy as protein, 25-35% energy from fat, 35-60% energy from carbohydrates, less than '200mg'$  of dietary cholesterol, 20-35 gm of total fiber daily    Hypertension Yes    Intervention Provide education on lifestyle modifcations including regular physical activity/exercise, weight management, moderate sodium restriction and increased consumption of fresh fruit, vegetables, and low fat dairy, alcohol moderation, and smoking cessation.;Monitor prescription use compliance.    Expected Outcomes Short Term: Continued assessment and intervention until BP is < 140/54m HG in hypertensive participants. < 130/874mHG in hypertensive participants with diabetes, heart failure or chronic kidney disease.;Long Term: Maintenance of blood pressure at goal levels.    Lipids Yes    Intervention Provide education and support for participant on nutrition & aerobic/resistive exercise along with prescribed medications to achieve LDL '70mg'$ , HDL >'40mg'$ .    Expected Outcomes Short Term: Participant states understanding of desired cholesterol values and is compliant with medications prescribed. Participant is following exercise prescription and nutrition guidelines.;Long Term: Cholesterol controlled with medications as prescribed, with individualized exercise RX and with personalized nutrition plan. Value goals: LDL < '70mg'$ , HDL > 40 mg.    Personal Goal Other Yes    Personal Goal Regain independence, not need to use cane. Be able to walk farther. Be able to perform ADLs. Get back to cooking. Have confidence to go on day excursions.    Intervention Provide individualized exercise prescription including aerobic, resistance,  and stretching exercises to help increase strength and cardiorespiratory fitness, so that patient will have confidence and ability to do activities of daily living, exercise, and leisure activities.    Expected Outcomes Patient will be able to resume exercise activities, lesiure activities, and activites of daily living with confidence. Patient will be able to walk farther as measured by six-minute walk test.              Personal Goals Discharge:  Goals and Risk Factor Review     Row Name 04/01/22 1444 04/16/22 1535 05/10/22 1353 05/30/22 1349       Core Components/Risk Factors/Patient Goals Review   Personal Goals Review Weight Management/Obesity;Improve shortness of breath with ADL's;Lipids;Hypertension Weight Management/Obesity;Improve shortness of breath with ADL's;Lipids;Hypertension Weight Management/Obesity;Improve shortness of breath with ADL's;Lipids;Hypertension Weight Management/Obesity;Improve shortness of breath with ADL's;Lipids;Hypertension    Review Kristen Suarez intensive caridac rehab on 04/01/22. Kristen Suarez fair with exercise as she is somewhat deconditoned. Vital signs were stable. Kristen Suarez report feeling lightheade on second piece of equipment. Resolved after rest and water was given Kristen Suarez making slow improvemnt. Kristen Suarez stopped using her cane as she has been feeling stronger. Vital signs and oxygen saturations have been stable Kristen Suarez to do well with exercise at intensive cardiac rehab.  Vital signs and oxygen saturations have  been stable. Kristen Suarez has lost 1.3 kg since starting cardiac rehab Kristen Suarez continues to do well with exercise at intensive cardiac rehab.  Vital signs and oxygen saturations have been stable. Kristen Suarez has lost 1.7 kg since starting cardiac rehab. Kristen Suarez completed cardiac rehab on 05/27/22    Expected Outcomes Kristen Suarez will conitnue to participate in intensive cardiac rehab for exercise, nutrition and lifestyle  modifications. Kristen Suarez will conitnue to participate in intensive cardiac rehab for exercise, nutrition and lifestyle modifications. Kristen Suarez will conitnue to participate in intensive cardiac rehab for exercise, nutrition and lifestyle modifications. Kristen Suarez will conitnue to  exercise, nutrition and lifestyle modifications upon completion of intensive cardiac rehab.             Exercise Goals and Review:  Exercise Goals     Row Name 03/26/22 1026             Exercise Goals   Increase Physical Activity Yes       Intervention Provide advice, education, support and counseling about physical activity/exercise needs.;Develop an individualized exercise prescription for aerobic and resistive training based on initial evaluation findings, risk stratification, comorbidities and participant's personal goals.       Expected Outcomes Short Term: Attend rehab on a regular basis to increase amount of physical activity.;Long Term: Exercising regularly at least 3-5 days a week.;Long Term: Add in home exercise to make exercise part of routine and to increase amount of physical activity.       Increase Strength and Stamina Yes       Intervention Provide advice, education, support and counseling about physical activity/exercise needs.;Develop an individualized exercise prescription for aerobic and resistive training based on initial evaluation findings, risk stratification, comorbidities and participant's personal goals.       Expected Outcomes Short Term: Increase workloads from initial exercise prescription for resistance, speed, and METs.;Short Term: Perform resistance training exercises routinely during rehab and add in resistance training at home;Long Term: Improve cardiorespiratory fitness, muscular endurance and strength as measured by increased METs and functional capacity (6MWT)       Able to understand and use rate of perceived exertion (RPE) scale Yes       Intervention Provide education and  explanation on how to use RPE scale       Expected Outcomes Short Term: Able to use RPE daily in rehab to express subjective intensity level;Long Term:  Able to use RPE to guide intensity level when exercising independently       Knowledge and understanding of Target Heart Rate Range (THRR) Yes       Intervention Provide education and explanation of THRR including how the numbers were predicted and where they are located for reference       Expected Outcomes Short Term: Able to state/look up THRR;Long Term: Able to use THRR to govern intensity when exercising independently;Short Term: Able to use daily as guideline for intensity in rehab       Able to check pulse independently Yes       Intervention Provide education and demonstration on how to check pulse in carotid and radial arteries.;Review the importance of being able to check your own pulse for safety during independent exercise       Expected Outcomes Short Term: Able to explain why pulse checking is important during independent exercise;Long Term: Able to check pulse independently and accurately       Understanding of Exercise Prescription Yes       Intervention Provide education, explanation, and written materials  on patient's individual exercise prescription       Expected Outcomes Short Term: Able to explain program exercise prescription;Long Term: Able to explain home exercise prescription to exercise independently                Exercise Goals Re-Evaluation:  Exercise Goals Re-Evaluation     Row Name 04/01/22 1129 04/15/22 1120 05/03/22 1136 05/06/22 1052 05/24/22 1014     Exercise Goal Re-Evaluation   Exercise Goals Review Increase Physical Activity;Increase Strength and Stamina;Able to understand and use rate of perceived exertion (RPE) scale;Able to understand and use Dyspnea scale Increase Physical Activity;Increase Strength and Stamina;Able to understand and use rate of perceived exertion (RPE) scale;Able to understand and  use Dyspnea scale Increase Physical Activity;Increase Strength and Stamina;Able to understand and use rate of perceived exertion (RPE) scale;Able to understand and use Dyspnea scale Increase Physical Activity;Increase Strength and Stamina;Able to understand and use rate of perceived exertion (RPE) scale;Able to understand and use Dyspnea scale;Able to check pulse independently;Knowledge and understanding of Target Heart Rate Range (THRR);Understanding of Exercise Prescription Increase Physical Activity;Increase Strength and Stamina;Able to understand and use rate of perceived exertion (RPE) scale;Able to understand and use Dyspnea scale;Able to check pulse independently;Knowledge and understanding of Target Heart Rate Range (THRR);Understanding of Exercise Prescription   Comments Patient able to understand and use RPE and dyspnea scales appropriately. Patient's goal is to get back to yoga. Because some poses involve body weight, patient will wait to check with Dr. Oval Suarez at next office visit on 05/01/22. Discussed trying to progress to 2.0 METs in the meantime, and patient is amenable to this. Patient's goal is to get stronger, be able to do her own housework, and more cooking. Patient is still having some surgical patient, which her physician says is normal. Patient still limited by SOB. Reviewed exercise prescription with patient. Patient is doing some walking and plans to return to yoga. Patient is also using 2 lb weights for her resistance training at home. Patient will complete the cardiac rehab program on Monday 05/27/22 and plans to continue exercise at [employer] walking at least 30 minutes, 3 days/week. Patient saw cardiologist yesterday in regards to ongoing surgical pain and was instructed not to use her arms with exercise and not to get down on mat when doing yoga. Not using arms on the recumbent stepper and modified overhead lift to biceps curl. Patient's functional capacity increased 23% as  measured by 6MWT. Patient feels the program has been beneficial to her. She's been able stop using can except with work in the yard, she has confidence with her exercise routine, and she was able to walk 221 feet further on her walk test.   Expected Outcomes Progress workloads and weights as tolerated to help increase strength and stamina. Continue to progress workloads to increase MET level. Resume yoga once cleared by cardiologist. Continue to progress workloads as tolerated to help increase strength and cardiorespiratory fitness. Patient will increase walking at home and resume yoga to help increase strength, stamina, and mobility and decrease SOB. Patient will exercise at least 30 minutes, 3 days/week at [employer].            Nutrition & Weight - Outcomes:  Pre Biometrics - 03/26/22 0922       Pre Biometrics   Waist Circumference 36 inches    Hip Circumference 38 inches    Waist to Hip Ratio 0.95 %    Triceps Skinfold 17 mm    %  Body Fat 35.3 %    Grip Strength 16 kg    Flexibility 0 in    Single Leg Stand 13.93 seconds             Post Biometrics - 05/27/22 1047        Post  Biometrics   Height 5' 3.5" (1.613 m)    Waist Circumference 34 inches    Hip Circumference 36.75 inches    Waist to Hip Ratio 0.93 %    Triceps Skinfold 19 mm    % Body Fat 34.8 %    Grip Strength 12 kg    Flexibility 0 in    Single Leg Stand 14.93 seconds             Nutrition:  Nutrition Therapy & Goals - 05/27/22 1121       Nutrition Therapy   Diet Heart Healthy Diet    Drug/Food Interactions Statins/Certain Fruits      Personal Nutrition Goals   Nutrition Goal Patient to identify strategies for reducing cardiovascular risk by attending the weekly [employer] education and nutrition series    Personal Goal #2 Patient to improve diet quality by using the plate method as a daily guide for meal planning to include lean protein/plant protein, fruits, vegetables, whole grains,  nonfat dairy as part of balanced diet    Personal Goal #3 Patient to limit sodium intake to 1500mg  per day    Personal Goal #4 Patient to identify strategies for weight gain/weight maintenance of 0.5-2.0# per week.    Comments Goals in action. Kristen Suarez continues to follow a heart healthy diet monitoring sodium, sugar, saturated fat, and fiber intake. She continues to attend the [employer] education and nutritin series. We have discussed strategies for weight gain/weight maintenance as she is down ~13#  over the last year (146# per documentation 05/31/2021) and reviewed most recent labs together as triglycerides were slightly elevated but she was not fasting for lab work. Her husband and family remain incredibly supportive. Kristen Suarez will continue to benefit from adherance to the [employer] eating plan and regular exercise.      Intervention Plan   Intervention Prescribe, educate and counsel regarding individualized specific dietary modifications aiming towards targeted core components such as weight, hypertension, lipid management, diabetes, heart failure and other comorbidities.;Nutrition handout(s) given to patient.    Expected Outcomes Short Term Goal: Understand basic principles of dietary content, such as calories, fat, sodium, cholesterol and nutrients.;Long Term Goal: Adherence to prescribed nutrition plan.;Short Term Goal: A plan has been developed with personal nutrition goals set during dietitian appointment.             Nutrition Discharge:  Nutrition Assessments - 05/23/22 0834       Rate Your Plate Scores   Post Score 84             Education Questionnaire Score:  Knowledge Questionnaire Score - 05/22/22 0826       Knowledge Questionnaire Score   Pre Score 20/24    Post Score 22/24             Goals reviewed with patient; copy given to patient.Pt graduates from  Intensive/Traditional cardiac rehab program on 05/27/22 with completion of  24 exercise and 24 education sessions.  Pt maintained good attendance and progressed nicely during their participation in rehab as evidenced by increased MET level.   Medication list reconciled. Repeat  PHQ score-1 .  Pt has made significant lifestyle changes and should be commended for their  success. Nikeria achieved their goals during cardiac rehab.   Pt plans to continue exercise at the [facility] gym using the same equipment she uses at cardiac rehab and walking, Lewanna increased her distance on her post exercise walk test by 221 feet. Jadee has lost 1.7 kg since starting cardiac rehab. We are proud of Arissa's progress!Harrell Gave RN BSN

## 2022-06-04 ENCOUNTER — Other Ambulatory Visit: Payer: Self-pay | Admitting: Nurse Practitioner

## 2022-06-04 DIAGNOSIS — R0982 Postnasal drip: Secondary | ICD-10-CM

## 2022-06-04 DIAGNOSIS — R059 Cough, unspecified: Secondary | ICD-10-CM

## 2022-06-11 ENCOUNTER — Encounter: Payer: Self-pay | Admitting: Emergency Medicine

## 2022-06-11 ENCOUNTER — Ambulatory Visit (INDEPENDENT_AMBULATORY_CARE_PROVIDER_SITE_OTHER): Payer: Medicare Other | Admitting: Emergency Medicine

## 2022-06-11 VITALS — BP 122/74 | HR 80 | Temp 98.3°F | Ht 62.75 in | Wt 130.0 lb

## 2022-06-11 DIAGNOSIS — J42 Unspecified chronic bronchitis: Secondary | ICD-10-CM | POA: Diagnosis not present

## 2022-06-11 DIAGNOSIS — D869 Sarcoidosis, unspecified: Secondary | ICD-10-CM

## 2022-06-11 NOTE — Patient Instructions (Addendum)
We will continue your Breztri 2 puffs twice a day.  Rinse and gargle after using. Keep your albuterol available to use 2 puffs if needed for shortness of breath, chest tightness, wheezing, mucus clearance.  You could consider taking 2 puffs prior to significant exercise Congratulations on completing cardiac rehab.  Continue to do moderate exercise to maintain your physical conditioning.  This will help your overall functional capacity and your breathing Vaccines are up-to-date Follow with Dr Lamonte Sakai in 6 months or sooner if you have any problems

## 2022-06-11 NOTE — Assessment & Plan Note (Signed)
We can discuss the timing of repeat surveillance pulmonary function testing and CT scan of the chest at our next visit.

## 2022-06-11 NOTE — Assessment & Plan Note (Signed)
We will continue your Breztri 2 puffs twice a day.  Rinse and gargle after using. Keep your albuterol available to use 2 puffs if needed for shortness of breath, chest tightness, wheezing, mucus clearance.  You could consider taking 2 puffs prior to significant exercise Congratulations on completing cardiac rehab.  Continue to do moderate exercise to maintain your physical conditioning.  This will help your overall functional capacity and your breathing Vaccines are up-to-date Follow with Dr Lamonte Sakai in 6 months or sooner if you have any problems

## 2022-06-11 NOTE — Progress Notes (Signed)
Subjective:    Patient ID: Kristen Suarez, female    DOB: Nov 25, 1945, 77 y.o.   MRN: [id number]  HPI  ROV 07/17/21 --77 year old woman whom I have followed for sarcoidosis with associated severe obstructive lung disease, restrictive lung disease.  She also has chronic cough in the setting of this as well as chronic allergic rhinitis and GERD.  She has had repeat PFT as below, most recent chest CT 12/2020.  She also underwent a home sleep study 06/08/2021 that showed mild OSA.  Currently managed on breztri albuterol as needed, Nasonex, Protonix,, Pepcid if needed, saline nasal spray, Zyrtec Treated for acute flares in November in setting of URI Husband hears some snoring, has heard some breathing pauses. She notes that she is up frequently to use rest room. Is concerned about whether she would be able to incorporate CPAP into her nights. She complains of low energy w exertion.   Pulmonary function testing 04/09/2021 reviewed by me shows very severe obstruction without a bronchodilator response, FEV1 49% predicted.  Restricted lung volumes, decreased diffusion capacity that does correct for alveolar volume.  Home sleep test 06/08/2021 reviewed, shows mild OSA and mild desaturations.  AHI 5.1/h, lowest saturation 83%.  ROV 06/11/2022 --Kristen Suarez is 77, follows up for severe obstructive lung disease and restrictive disease associated with sarcoidosis.  Also with chronic allergic rhinitis, GERD, new dx CAD, mild OSA by sleep study 05/2021.  We discussed possible CPAP but decided to defer given concern that she may not be able to tolerate.  Since I last saw her she underwent CABG 12/2021.  She has been undergoing cardiac rehab, just finishing.  She Currently managed on Breztri. She has occasional wheeze. She does have some random exertional SOB, can happen w walking 137ft, but then again she can do 30 minutes observed exercise. She has been using her albuterol, about 1-2x a week (less than 1 month ago).    Review of Systems  Constitutional:  Negative for fever and unexpected weight change.  HENT:  Negative for congestion, dental problem, ear pain, nosebleeds, postnasal drip, rhinorrhea, sinus pressure, sneezing, sore throat and trouble swallowing.   Eyes:  Negative for redness and itching.  Respiratory:  Positive for cough and shortness of breath. Negative for chest tightness and wheezing.   Cardiovascular:  Negative for palpitations and leg swelling.  Gastrointestinal:  Negative for nausea and vomiting.  Genitourinary:  Negative for dysuria.  Musculoskeletal:  Negative for joint swelling.  Skin:  Negative for rash.  Neurological:  Negative for headaches.  Hematological:  Does not bruise/bleed easily.  Psychiatric/Behavioral:  Negative for dysphoric mood. The patient is not nervous/anxious.       Objective:   Physical Exam Vitals:   06/11/22 1015  BP: 122/74  Pulse: 80  Temp: 98.3 F (36.8 C)  TempSrc: Oral  SpO2: 98%  Weight: 130 lb (59 kg)  Height: 5' 2.75" (1.594 m)   Gen: Pleasant, well-nourished, in no distress,  normal affect  ENT: No lesions,  mouth clear,  oropharynx clear, no postnasal drip  Neck: No JVD, no upper airway noise  Lungs: No use of accessory muscles, good excursion, crackles or wheezes  Cardiovascular: RRR, heart sounds normal, no murmur or gallops, no peripheral edema  Musculoskeletal: No deformities, no cyanosis or clubbing  Neuro: alert, awake, non focal  Skin: Warm, no lesions or rash      Assessment & Plan:  Sarcoidosis We can discuss the timing of repeat surveillance pulmonary function testing and CT  scan of the chest at our next visit.  COPD (chronic obstructive pulmonary disease) (HCC) We will continue your Breztri 2 puffs twice a day.  Rinse and gargle after using. Keep your albuterol available to use 2 puffs if needed for shortness of breath, chest tightness, wheezing, mucus clearance.  You could consider taking 2 puffs prior to  significant exercise Congratulations on completing cardiac rehab.  Continue to do moderate exercise to maintain your physical conditioning.  This will help your overall functional capacity and your breathing Vaccines are up-to-date Follow with Dr Lamonte Sakai in 6 months or sooner if you have any problems  Baltazar Apo, MD, PhD 06/11/2022, 10:49 AM [facility] [phone] or if no answer [phone]

## 2022-06-20 ENCOUNTER — Ambulatory Visit (HOSPITAL_COMMUNITY)
Admission: RE | Admit: 2022-06-20 | Discharge: 2022-06-20 | Disposition: A | Discharge status: Home / Self Care | Payer: Medicare Other | Source: Ambulatory Visit | Attending: Cardiology | Admitting: Cardiology

## 2022-06-20 DIAGNOSIS — I701 Atherosclerosis of renal artery: Secondary | ICD-10-CM

## 2022-06-21 ENCOUNTER — Other Ambulatory Visit: Payer: Self-pay | Admitting: *Deleted

## 2022-06-21 DIAGNOSIS — I701 Atherosclerosis of renal artery: Secondary | ICD-10-CM

## 2022-06-27 DIAGNOSIS — H903 Sensorineural hearing loss, bilateral: Secondary | ICD-10-CM | POA: Insufficient documentation

## 2022-07-10 ENCOUNTER — Encounter: Payer: Self-pay | Admitting: Nurse Practitioner

## 2022-07-10 ENCOUNTER — Other Ambulatory Visit: Payer: Self-pay | Admitting: Nurse Practitioner

## 2022-07-10 DIAGNOSIS — N8111 Cystocele, midline: Secondary | ICD-10-CM

## 2022-07-14 ENCOUNTER — Encounter: Payer: Self-pay | Admitting: Internal Medicine

## 2022-07-19 NOTE — Progress Notes (Signed)
Office Visit Note  Patient: Kristen Suarez             Date of Birth: 09-02-1945           MRN: [id number]             PCP: Anne Ng, NP Referring: Anne Ng, NP Visit Date: 08/01/2022 Occupation: @GUAROCC @  Subjective:  Thoracic pain  History of Present Illness: Kristen Suarez is a 77 y.o. female with history of sarcoidosis, osteoarthritis, degenerative disc disease and osteopenia.  She returns today after her last visit in May 2023.  She states she underwent a coronary artery bypass surgery in October 2023.  She is gradually recovering from it.  She continues to have some thoracic discomfort.  She has been trying to do some stretching exercises.  Her hands hurt with the weather change.  She is currently having some discomfort in her left foot.  She is trying to do some stretching exercises.  She denies any increased shortness of breath.  She denies any palpitations or new rashes.  She was evaluated by Dr. Delton Coombes recently and was advised to have repeat PFTs and CT scan in 6 months.  She is also closely followed by her cardiologist.    Activities of Daily Living:  Patient reports morning stiffness for 0 minutes.   Patient Denies nocturnal pain.  Difficulty dressing/grooming: Denies Difficulty climbing stairs: Reports Difficulty getting out of chair: Reports Difficulty using hands for taps, buttons, cutlery, and/or writing: Reports  Review of Systems  Constitutional:  Positive for fatigue.  HENT:  Negative for mouth sores and mouth dryness.   Eyes:  Negative for dryness.  Respiratory:  Negative for shortness of breath.   Cardiovascular:  Negative for chest pain and palpitations.  Gastrointestinal:  Positive for constipation. Negative for blood in stool and diarrhea.  Endocrine: Negative for increased urination.  Genitourinary:  Negative for involuntary urination.  Musculoskeletal:  Positive for joint pain, joint pain, myalgias, muscle weakness, muscle  tenderness and myalgias. Negative for gait problem, joint swelling and morning stiffness.  Skin:  Negative for color change, rash, hair loss and sensitivity to sunlight.  Allergic/Immunologic: Negative for susceptible to infections.  Neurological:  Positive for dizziness. Negative for headaches.  Hematological:  Negative for swollen glands.  Psychiatric/Behavioral:  Positive for sleep disturbance. Negative for depressed mood. The patient is nervous/anxious.     PMFS History:  Patient Active Problem List   Diagnosis Date Noted   Hearing loss of right ear 02/04/2022   Hx of CABG 01/04/2022   Elevated coronary artery calcium score    Chronic kidney disease, stage 3b (HCC) 01/01/2022   Syncope 12/31/2021   Renal artery stenosis (HCC) 09/25/2021   Aortic valve sclerosis 09/25/2021   Snoring 04/13/2021   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   Epistaxis 03/29/2021   Coronary artery disease due to calcified coronary lesion 08/15/2020   Basal cell carcinoma 06/30/2020   History of loop recorder 12/13/2019   Allergic rhinitis 12/08/2019   Cough 11/08/2019   Post-nasal drip 11/08/2019   Multiple joint pain 04/22/2019   Bilateral leg edema 04/22/2019   Vaginal atrophy 01/05/2019   Resting tremor 09/08/2018   Sarcoidosis 05/08/2018   HTN (hypertension) 05/08/2018   Colon polyp 05/08/2018   Family hx of colon cancer 05/08/2018   Cystocele, midline 05/08/2018   Hyperlipidemia 05/08/2018   Vasovagal syncope 05/08/2018   Seborrheic keratosis 05/08/2018   Pseudophakia of both eyes 05/08/2018   GERD (gastroesophageal reflux  disease) 05/08/2018   COPD (chronic obstructive pulmonary disease) (HCC) 05/08/2018    Past Medical History:  Diagnosis Date   Abnormality of left atrial appendage 12/2021   s/p LAA clipping   Aortic valve sclerosis 09/25/2021   Arthritis    Asthma    CAD in native artery 08/15/2020   Chickenpox    Colon polyps 2010   Epistaxis 03/29/2021   Heart murmur    History  of fainting spells of unknown cause    Hyperlipidemia    Hypertension    Renal artery stenosis (HCC) 09/25/2021   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/1976    Family History  Problem Relation Age of Onset   Arthritis Mother    Cancer Mother    Depression Mother    Heart attack Mother    Hyperlipidemia Mother    Hypertension Mother    Alcohol abuse Father    Heart disease Father    Hypertension Father    Hyperlipidemia Father    Heart disease Sister    Stroke Sister    Hyperlipidemia Sister    Alcohol abuse Sister    Hyperlipidemia Sister    Hypertension Sister    Alcohol abuse Brother    Heart disease Brother    Hypertension Brother    Hyperlipidemia Brother    Alcohol abuse Brother    Early death Brother    Heart attack Brother    Heart disease Brother    Hyperlipidemia Brother    Heart disease Maternal Grandfather    Heart disease Paternal Grandfather    Multiple sclerosis Daughter    Hyperlipidemia Son    Past Surgical History:  Procedure Laterality Date   ABDOMINAL HYSTERECTOMY  1987   BASAL CELL CARCINOMA EXCISION  06/30/2020   nose   CARDIAC CATHETERIZATION     CLIPPING OF ATRIAL APPENDAGE N/A 01/04/2022   Procedure: CLIPPING OF ATRIAL APPENDAGE USING SIZE 40 ATRICURE ATRIAL CLIP;  Surgeon: Lyn Hollingshead, MD;  Location: [facility] OR;  Service: Open Heart Surgery;  Laterality: N/A;   CORONARY ARTERY BYPASS GRAFT N/A 01/04/2022   Procedure: CORONARY ARTERY BYPASS GRAFTING (CABG) x  USING ENDOSCOPIC GREATER SAPHENOUS VEIN HARVEST;  Surgeon: Lyn Hollingshead, MD;  Location: [facility] OR;  Service: Open Heart Surgery;  Laterality: N/A;   LEFT HEART CATH AND CORONARY ANGIOGRAPHY N/A 01/02/2022   Procedure: LEFT HEART CATH AND CORONARY ANGIOGRAPHY;  Surgeon: Lennette Bihari, MD;  Location: [facility] INVASIVE [facility];  Service: Cardiovascular;  Laterality: N/A;   LUNG BIOPSY  06/16/1976   PERIPHERAL VASCULAR INTERVENTION Right 11/29/2020   Procedure: PERIPHERAL VASCULAR INTERVENTION;   Surgeon: Iran Ouch, MD;  Location: [facility];  Service: Cardiovascular;  Laterality: Right;  Renal Artery   RENAL ANGIOGRAPHY N/A 11/29/2020   Procedure: RENAL ANGIOGRAPHY;  Surgeon: Iran Ouch, MD;  Location: [facility];  Service: Cardiovascular;  Laterality: N/A;   TEE WITHOUT CARDIOVERSION N/A 01/04/2022   Procedure: TRANSESOPHAGEAL ECHOCARDIOGRAM (TEE);  Surgeon: Lyn Hollingshead, MD;  Location: [facility] OR;  Service: Open Heart Surgery;  Laterality: N/A;   Social History   Social History Narrative   Not on file   Immunization History  Administered Date(s) Administered   COVID-19, mRNA, vaccine(Comirnaty)12 years and older 12/23/2021   Fluad Quad(high Dose 65+) 11/19/2018, 12/22/2020, 11/30/2021   Influenza, High Dose Seasonal PF 01/05/2016, 12/12/2016, 12/11/2017   Influenza,inj,Quad PF,6+ Mos 12/11/2017   Influenza-Unspecified 11/24/2019, 11/24/2019   PFIZER Comirnaty(Gray Top)Covid-19 Tri-Sucrose Vaccine  06/27/2020   PFIZER(Purple Top)SARS-COV-2 Vaccination 05/14/2019, 06/08/2019, 12/20/2019   Pfizer Covid-19 Vaccine Bivalent Booster 69yrs & up 12/14/2020   Pneumococcal Conjugate-13 03/23/2014   Pneumococcal Polysaccharide-23 03/24/2012, 01/22/2016   Respiratory Syncytial Virus Vaccine,Recomb Aduvanted(Arexvy) 11/30/2021   Tdap 03/24/2012   Zoster Recombinat (Shingrix) 07/31/2020, 01/29/2021     Objective: Vital Signs: BP (!) 172/82 (BP Location: Left Arm, Patient Position: Sitting, Cuff Size: Normal)   Pulse 71   Resp 14   Ht 5' 2.75" (1.594 m)   Wt 127 lb 3.2 oz (57.7 kg)   BMI 22.71 kg/m    Physical Exam Vitals and nursing note reviewed.  Constitutional:      Appearance: She is well-developed.  HENT:     Head: Normocephalic and atraumatic.  Eyes:     Conjunctiva/sclera: Conjunctivae normal.  Cardiovascular:     Rate and Rhythm: Normal rate and regular rhythm.     Heart sounds: Normal heart sounds.  Pulmonary:     Effort: Pulmonary  effort is normal.     Breath sounds: Normal breath sounds.  Abdominal:     General: Bowel sounds are normal.     Palpations: Abdomen is soft.  Musculoskeletal:     Cervical back: Normal range of motion.  Lymphadenopathy:     Cervical: No cervical adenopathy.  Skin:    General: Skin is warm and dry.     Capillary Refill: Capillary refill takes less than 2 seconds.  Neurological:     Mental Status: She is alert and oriented to person, place, and time.  Psychiatric:        Behavior: Behavior normal.      Musculoskeletal Exam: She had limited lateral rotation of the cervical spine.  She has severe thoracic kyphosis.  She had muscular discomfort in the thoracic paravertebral region.  There was no point tenderness over thoracic or lumbar spine.  Shoulder joints, elbow joints, wrist joints in good range of motion.  She had no synovitis over MCPs.  She had bilateral PIP and DIP thickening consistent with osteoarthritis.  Hip joints and knee joints were in good range of motion.  She had no tenderness over ankles or MTPs.  CDAI Exam: CDAI Score: -- Patient Global: --; Provider Global: -- Swollen: --; Tender: -- Joint Exam 08/01/2022   No joint exam has been documented for this visit   There is currently no information documented on the homunculus. Go to the Rheumatology activity and complete the homunculus joint exam.  Investigation: No additional findings.  Imaging: No results found.  Recent Labs: Lab Results  Component Value Date   WBC 8.6 04/30/2022   HGB 12.5 04/30/2022   PLT 308 04/30/2022   NA 140 07/23/2022   K 4.0 07/23/2022   CL 100 07/23/2022   CO2 22 07/23/2022   GLUCOSE 97 07/23/2022   BUN 23 07/23/2022   CREATININE 0.84 07/23/2022   BILITOT <0.2 07/23/2022   ALKPHOS 133 (H) 07/23/2022   AST 19 07/23/2022   ALT 14 07/23/2022   PROT 6.6 07/23/2022   ALBUMIN 4.4 07/23/2022   CALCIUM 9.7 07/23/2022   GFRAA 57 (L) 05/05/2019    Speciality Comments: No  specialty comments available.  Procedures:  No procedures performed Allergies: Keflex [cephalexin], Penicillins, and Sulfa antibiotics   Assessment / Plan:     Visit Diagnoses: Sarcoidosis - patient was diagnosed with sarcoidosis at age 37 and was treated with prednisone for 15 years.She states she has been followed by Dr. Delton Coombes.  I reviewed office visit  notes of Dr. Corliss Blacker.  He plans to repeat PFTs and CT scan at the next visit in 6 months.  Patient denies any increased shortness of breath.  She denies history of inflammatory arthritis or rash.  I advised her to contact me if she develops any new symptoms.  Primary osteoarthritis of both hands-she has osteoarthritis involving bilateral hands.  She has incomplete fist formation bilaterally.  Joint protection muscle strengthening was discussed.  She is very active with housework.  Primary osteoarthritis of both feet-she has off-and-on discomfort in her feet.  Proper fitting shoes were advised.  Pain in thoracic spine -she has thoracic kyphosis.  X-ray of her thoracic spine which showed mild thoracic spondylosis and scoliosis on June 27, 2021 by Dr. Manson Passey.  She continues to have some discomfort in the thoracic region which is mostly muscular.  Gentle stretching exercises were discussed.  I advised her to get clearance from her surgeon before she can do more exercises for the thoracic region.  Postural kyphosis of thoracic region  Osteopenia of multiple sites - The BMD measured at Femur Neck Right is 0.725 g/cm2 with a T-score of -2.2.  On March 14, 2020.  Patient needs repeat DEXA scan.  She will discuss with her PCP.  Use of calcium rich diet and vitamin D was discussed.  Muscle spasm-she denies muscle spasms today.  Other medical problems are listed as follows:  History of hyperlipidemia  HTN (hypertension), benign-blood pressure was elevated at 170/79.  Repeat blood pressure was 172/82.  She was advised to monitor blood pressure closely  and follow-up with the PCP.  Hx of CABG - 12/2021  History of gastroesophageal reflux (GERD)  Chronic bronchitis, unspecified chronic bronchitis type (HCC)  Hx of colonic polyps  Renal artery stenosis (HCC) - s/p stent placement 11/2021.  Resting tremor  Seborrheic keratosis  Pseudophakia of both eyes  Family history of multiple sclerosis  Orders: No orders of the defined types were placed in this encounter.  No orders of the defined types were placed in this encounter.    Follow-Up Instructions: Return in about 6 months (around 02/01/2023) for Osteoarthritis, Sarcoidosis.   Pollyann Savoy, MD  Note - This record has been created using Animal nutritionist.  Chart creation errors have been sought, but may not always  have been located. Such creation errors do not reflect on  the standard of medical care.

## 2022-07-24 LAB — COMPREHENSIVE METABOLIC PANEL
ALT: 14 IU/L (ref 0–32)
AST: 19 IU/L (ref 0–40)
Albumin/Globulin Ratio: 2 (ref 1.2–2.2)
Albumin: 4.4 g/dL (ref 3.8–4.8)
Alkaline Phosphatase: 133 IU/L — ABNORMAL HIGH (ref 44–121)
BUN/Creatinine Ratio: 27 (ref 12–28)
BUN: 23 mg/dL (ref 8–27)
Bilirubin Total: <0.2 mg/dL (ref 0.0–1.2)
CO2: 22 mmol/L (ref 20–29)
Calcium: 9.7 mg/dL (ref 8.7–10.3)
Chloride: 100 mmol/L (ref 96–106)
Creatinine, Ser: 0.84 mg/dL (ref 0.57–1.00)
Globulin, Total: 2.2 g/dL (ref 1.5–4.5)
Glucose: 97 mg/dL (ref 70–99)
Potassium: 4 mmol/L (ref 3.5–5.2)
Sodium: 140 mmol/L (ref 134–144)
Total Protein: 6.6 g/dL (ref 6.0–8.5)
eGFR: 72 mL/min/{1.73_m2} (ref >59)

## 2022-07-24 LAB — LIPID PANEL
Chol/HDL Ratio: 2.6 ratio (ref 0.0–4.4)
Cholesterol, Total: 119 mg/dL (ref 100–199)
HDL: 46 mg/dL (ref >39)
LDL Chol Calc (NIH): 54 mg/dL (ref 0–99)
Triglycerides: 103 mg/dL (ref 0–149)
VLDL Cholesterol Cal: 19 mg/dL (ref 5–40)

## 2022-07-30 ENCOUNTER — Encounter (HOSPITAL_BASED_OUTPATIENT_CLINIC_OR_DEPARTMENT_OTHER): Payer: Self-pay | Admitting: Cardiovascular Disease

## 2022-07-30 ENCOUNTER — Other Ambulatory Visit: Payer: Self-pay | Admitting: Nurse Practitioner

## 2022-07-30 ENCOUNTER — Other Ambulatory Visit (HOSPITAL_BASED_OUTPATIENT_CLINIC_OR_DEPARTMENT_OTHER): Payer: Self-pay | Admitting: Cardiovascular Disease

## 2022-07-30 ENCOUNTER — Ambulatory Visit (INDEPENDENT_AMBULATORY_CARE_PROVIDER_SITE_OTHER): Payer: Medicare Other | Admitting: Cardiovascular Disease

## 2022-07-30 VITALS — BP 154/78 | HR 74 | Ht 62.75 in | Wt 127.1 lb

## 2022-07-30 DIAGNOSIS — Z951 Presence of aortocoronary bypass graft: Secondary | ICD-10-CM | POA: Diagnosis not present

## 2022-07-30 DIAGNOSIS — R0982 Postnasal drip: Secondary | ICD-10-CM

## 2022-07-30 DIAGNOSIS — I701 Atherosclerosis of renal artery: Secondary | ICD-10-CM | POA: Diagnosis not present

## 2022-07-30 DIAGNOSIS — I10 Essential (primary) hypertension: Secondary | ICD-10-CM

## 2022-07-30 DIAGNOSIS — R059 Cough, unspecified: Secondary | ICD-10-CM

## 2022-07-30 MED ORDER — CARVEDILOL 12.5 MG PO TABS: 12.5000 mg | ORAL_TABLET | Freq: Two times a day (BID) | ORAL | 3 refills | Status: DC

## 2022-07-30 NOTE — Assessment & Plan Note (Signed)
She continues to struggle with recovery after coronary artery bypass grafting.  She is encouraged to continue exercising.  Lipids are well-controlled on rosuvastatin and Repatha.  Continue aspirin, amlodipine, and switch metoprolol to carvedilol as above.  The discomforts she is feeling seem to be musculoskeletal and postsurgical in nature.  No evidence of ischemia.  Will check a TSH given her report of fatigue and weight loss.

## 2022-07-30 NOTE — Progress Notes (Signed)
Advanced Hypertension Clinic Follow-up:    Date:  07/30/2022   ID:  Kristen Suarez, DOB 03-Mar-1946, MRN [id number]  PCP:  Kristen Ng, NP  Cardiologist:  Kristen Si, MD   Referring MD: Kristen Ng, NP   CC: Hypertension  History of Present Illness:    Kristen Suarez is a 77 y.o. female with a hx of coronary artery disease s/p CABG, hypertension, hyperlipidemia, renal artery stenosis s/p stenting, pulmonary sarcoidosis, and asthma here for follow-up. She was initially seen in the [facility]pert[facility] Clinic 08/15/2020.  She was first diagnosed with hypertension 20 years ago.  She was in pulmonary rehab and the nurses were alarmed that her BP was running so high.  Her BP increased to 200 when walking.  It remained 140-160/70-80s.  [name] had been working with Kristen Penna, NP on her blood pressure control.  Most recently hydralazine was added to her regimen of amlodipine and irbesartan.  Her home blood pressure readings remained elevated though her office readings seem to have improved.  She was referred to advanced hypertension clinic for further management.  Her BP hasn't changed since adding hydralazine.  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  Prior to this she was exercising regularly. She reported orthopnea which is chronic and she attributes to her underlying lung disease.  She did have COVID in 2021 and notes that she had been more short of breath since that time.    [name] underwent stenting of the right renal artery 11/29/2020. A few days later she developed symptoms of pre-syncope and hypotension. She presented to the ED 01/03/2021 following a syncopal episode during a chair yoga class. There was a prodrome of dizziness, and she eased herself off of the chair prior to losing conciousness. Afterwards she felt fatigue and nausea. She decreased amlodipine to 5 mg and Irbesartan to 150 mg. She followed up with Kristen Shields, NP on  01/22/2021 and was feeling well. Given her recurrent syncope, she had an Echo 02/2021 which showed LVEF 60-65% with grade 1 diastolic dysfunction. She had a small pericardial effusion. She had carotid dopplers with minimal thickening bilaterally.    [name] reported limitations of formal exercise due to health issues and becoming short winded quickly. She complained of occasional epistaxis since her renal artery stent placement which she attributed to Plavix. She had taken her final dose of Plavix 2 days prior. She was continued on amlodipine and her irbesartan was increased to 150 mg twice daily. She followed up with Dr. Kirke Suarez 07/2021 and was doing well.  At her visit 09/2021 she was transitioned to amlodipine/valsartan/HCTZ.  [name] was added for HFpEF and dyspnea.  She had a coronary CTA that showed severe CAD.  She had an episode of syncope and was admitted 12/2021.  Her syncope was thought to be vagal in etiology.  She had an ILR but the battery was at end-of-life.  Left heart cath revealed severe multivessel disease and she underwent CABG ((LIMA - LAD, SVG - OM, SVG - PDA, SVG - Diag) with left atrial appendage clip on 12/2021.  Echo post procedurally revealed LVEF greater than 75% with trivial MR and a small PFO cannot be excluded.  She had postoperative atrial fibrillation and was discharged on amiodarone.  She saw Kristen Shields, NP 01/2022 and was still struggling with postoperative surgical discomfort. blood pressure was elevated in the office but controlled at home.  Lipids were uncontrolled and Repatha was  added.  She completed cardiac rehab and saw Dr. Delia Chimes 05/2022.  Renal artery Dopplers 06/2022 showed that her stent was patent.  [name] reports feeling better than her last visit, estimating her overall health at about 50 to 70% of her usual capacity. She mentions that she still experiences significant fatigue, which limits her ability to perform household tasks, and describes her walking  as unstable, often feeling a wobble or weave. She also notes intermittent pain and some residual numbness.  She has paused her [name] medication due to severe vaginal and rectal pressure, which has since improved. Despite this, she continues to lose weight with fluctuating appetite levels. [name] expresses concerns about her blood pressure, which varies throughout the day, being lower in the mornings and higher in the afternoons and evenings. She is currently prescribed amlodipine and metoprolol for her condition.  [name] describes a sharp, left-sided pain experienced during exercise, which was particularly problematic during her rehabilitation but has recently begun to subside. This pain intensifies with deep inhalation and certain movements, suggesting a possible musculoskeletal or nerve origin.  The patient also discusses her post-surgical recovery, indicating that while her surgeon believes she is healing well, the process is slow and sometimes frustrating due to muscular and nerve issues. She is actively trying to increase her physical activity but finds her stability and endurance significantly compromised, impacting her ability to engage in more strenuous activities like gardening.  Overall, [name] is focused on managing her symptoms and improving her physical function, though she acknowledges the slow nature of her recovery.  Previous antihypertensives: Unsure   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/1976    Past Surgical History:  Procedure Laterality Date   ABDOMINAL  HYSTERECTOMY  1987   BASAL CELL CARCINOMA EXCISION  06/30/2020   nose   CARDIAC CATHETERIZATION     CLIPPING OF ATRIAL APPENDAGE N/A 01/04/2022   Procedure: CLIPPING OF ATRIAL APPENDAGE USING SIZE 40 ATRICURE ATRIAL CLIP;  Surgeon: Lyn Hollingshead, MD;  Location: [facility] OR;  Service: Open Heart Surgery;  Laterality: N/A;   CORONARY ARTERY BYPASS GRAFT N/A 01/04/2022   Procedure: CORONARY ARTERY BYPASS GRAFTING (CABG) x  USING ENDOSCOPIC GREATER SAPHENOUS VEIN HARVEST;  Surgeon: Lyn Hollingshead, MD;  Location: [facility] OR;  Service: Open Heart Surgery;  Laterality: N/A;   LEFT HEART CATH AND CORONARY ANGIOGRAPHY N/A 01/02/2022   Procedure: LEFT HEART CATH AND CORONARY ANGIOGRAPHY;  Surgeon: Lennette Bihari, MD;  Location: [facility] INVASIVE CV LAB;  Service: Cardiovascular;  Laterality: N/A;   LUNG BIOPSY  06/16/1976   PERIPHERAL VASCULAR INTERVENTION Right 11/29/2020   Procedure: PERIPHERAL VASCULAR INTERVENTION;  Surgeon: Iran Ouch, MD;  Location: [facility] INVASIVE CV LAB;  Service: Cardiovascular;  Laterality: Right;  Renal Artery   RENAL ANGIOGRAPHY N/A 11/29/2020   Procedure: RENAL ANGIOGRAPHY;  Surgeon: Iran Ouch, MD;  Location: [facility] INVAS[facility] CV [facility];  Service: Cardiovascular;  Laterality: N/A;   TEE WITHOUT CARDIOVERSION N/A 01/04/2022  Procedure: TRANSESOPHAGEAL ECHOCARDIOGRAM (TEE);  Surgeon: Lyn Hollingshead, MD;  Location: [facility];  Service: Open Heart Surgery;  Laterality: N/A;    Current Medications: Current Meds  Medication Sig   acetaminophen (TYLENOL) 500 MG tablet Take 500 mg by mouth every 6 (six) hours as needed for moderate pain.   albuterol (PROVENTIL) (2.5 MG/3ML) 0.083% nebulizer solution INHALE (2.5MG ) VIA NEBULIZER EVERY 6 HOURS AS NEEDED FOR WHEEZING/SHORTNESS OF BREATH   amLODipine (NORVASC) 5 MG tablet Take 1 tablet (5 mg total) by mouth daily.   aspirin EC 81 MG tablet Take 1 tablet (81 mg total) by mouth daily. Swallow whole.   BREZTRI AEROSPHERE 160-9-4.8 MCG/ACT AERO  INHALE 2 PUFFS INTO THE LUNGS IN THE MORNING AND AT BEDTIME.   Calcium Carb-Cholecalciferol (CALCIUM 600 + D PO) Take 1 tablet by mouth daily.   carvedilol (COREG) 12.5 MG tablet Take 1 tablet (12.5 mg total) by mouth 2 (two) times daily.   cetirizine (ZYRTEC) 10 MG tablet Take 10 mg by mouth daily as needed for allergies.   docusate sodium (COLACE) 100 MG capsule Take 100 mg by mouth daily.    Evolocumab (REPATHA SURECLICK) 140 MG/ML SOAJ Inject 140 mg into the skin every 14 (fourteen) days.   mometasone (NASONEX) 50 MCG/ACT nasal spray SHAKE LIQUID AND USE 2 SPRAYS IN EACH NOSTRIL DAILY   Multiple Vitamins-Minerals (MULTIVITAL PO) Take 1 tablet by mouth daily.    pantoprazole (PROTONIX) 40 MG tablet TAKE 1 TABLET BY MOUTH EVERY DAY   Probiotic Product (PROBIOTIC DAILY PO) Take 1 tablet by mouth daily.    rosuvastatin (CRESTOR) 40 MG tablet TAKE 1 TABLET BY MOUTH EVERY DAY   sodium chloride (OCEAN) 0.65 % SOLN nasal spray Place 1 spray into both nostrils as needed for congestion.   [DISCONTINUED] metoprolol tartrate (LOPRESSOR) 25 MG tablet Take 1 tablet (25 mg total) by mouth 2 (two) times daily.     Allergies:   Keflex [cephalexin], Penicillins, and Sulfa antibiotics   Social History   Socioeconomic History   Marital status: Married    Spouse name: Not on file   Number of children: 3   Years of education: 26   Highest education level: Master's degree (e.g., MA, MS, MEng, MEd, MSW, MBA)  Occupational History   Occupation: retired  Tobacco Use   Smoking status: Never    Passive exposure: Past   Smokeless tobacco: Never  Vaping Use   Vaping Use: Never used  Substance and Sexual Activity   Alcohol use: Not Currently    Comment: once in a while   Drug use: Never   Sexual activity: Not Currently  Other Topics Concern   Not on file  Social History Narrative   Not on file   Social Determinants of Health   Financial Resource Strain: Low Risk  (02/25/2022)   Overall Financial  Resource Strain (CARDIA)    Difficulty of Paying Living Expenses: Not hard at all  Food Insecurity: No Food Insecurity (02/25/2022)   Hunger Vital Sign    Worried About Running Out of Food in the Last Year: Never true    Ran Out of Food in the Last Year: Never true  Transportation Needs: No Transportation Needs (02/25/2022)   PRAPARE - Administrator, Civil Service (Medical): No    Lack of Transportation (Non-Medical): No  Physical Activity: Sufficiently Active (02/25/2022)   Exercise Vital Sign    Days of Exercise per Week: 7 days    Minutes of  Exercise per Session: 40 min  Stress: No Stress Concern Present (02/25/2022)   [employer] of Occupational Health - Occupational Stress Questionnaire    Feeling of Stress : Not at all  Social Connections: Moderately Isolated (02/13/2021)   Social Connection and Isolation Panel [NHANES]    Frequency of Communication with Friends and Family: Twice a week    Frequency of Social Gatherings with Friends and Family: Twice a week    Attends Religious Services: Never    Database administrator or Organizations: No    Attends Engineer, structural: Never    Marital Status: Married     Family History: The patient's family history includes Alcohol abuse in her brother, brother, father, and sister; Arthritis in her mother; Cancer in her mother; Depression in her mother; Early death in her brother; Heart attack in her brother and mother; Heart disease in her brother, brother, father, maternal grandfather, paternal grandfather, and sister; Hyperlipidemia in her brother, brother, father, mother, sister, sister, and son; Hypertension in her brother, father, mother, and sister; Multiple sclerosis in her daughter; Stroke in her sister.  ROS:   Please see the history of present illness.    (+) Chronic back pain (+) Muscle cramping of bilateral hands and LE  (+) Right knee weakness All other systems reviewed and are  negative.  EKGs/Labs/Other Studies Reviewed:    Renal Artery Doppler  06/19/2021: Summary:  Largest Aortic Diameter: 2.0 cm     Renal:     Right: No evidence of right renal artery stenosis, s/p renal artery         stenting. RRV flow present. Normal size right kidney. Normal         right Resisitive Index. Normal cortical thickness of right         kidney.  Left:  Known left renal artery occlusion.  Mesenteric:  Normal Celiac artery and Superior Mesenteric artery findings.  Patent IVC.   Blood pressure monitor 06/06/2021: 24 Hour Blood Pressure Monitoring   Overall Average BP:  147/73 Overall Average Heart Rate: 78 Awake Average BP: 149/79, Heart Rate 79 Asleep Average BP: 143/70, Heart Rate 74   87% SBP >140 mmHg  Bilateral Carotid Duplex 02/16/2021: Summary:  Right Carotid: The extracranial vessels were near-normal with only minimal wall thickening or plaque.   Left Carotid: The extracranial vessels were near-normal with only minimal  wall thickening or plaque.   Vertebrals:  Bilateral vertebral arteries demonstrate antegrade flow.  Subclavians: Normal flow hemodynamics were seen in bilateral subclavian arteries.   Echo 02/16/2021:  1. There is a mid LV cavitary dynamic gradient due to hyperdynamic LVF.  Left ventricular ejection fraction, by estimation, is 60 to 65%. Left  ventricular ejection fraction by 3D volume is 64 %. The left ventricle has  normal function. The left  ventricle has no regional wall motion abnormalities. Left ventricular  diastolic parameters are consistent with Grade I diastolic dysfunction  (impaired relaxation). The average left ventricular global longitudinal  strain is -16.8 %. The global  longitudinal strain is normal.   2. Right ventricular systolic function is normal. The right ventricular  size is normal. There is normal pulmonary artery systolic pressure. The  estimated right ventricular systolic pressure is 9.1 mmHg.   3. The  pericardial effusion is anterior to the right ventricle.   4. The mitral valve is normal in structure. No evidence of mitral valve  regurgitation. No evidence of mitral stenosis.   5. The aortic valve is  calcified. Aortic valve regurgitation is not  visualized. Aortic valve sclerosis/calcification is present, without any  evidence of aortic stenosis. Aortic valve area, by VTI measures 1.88 cm.  Aortic valve mean gradient measures  5.3 mmHg. Aortic valve Vmax measures 1.64 m/s.   6. The inferior vena cava is normal in size with greater than 50%  respiratory variability, suggesting right atrial pressure of 3 mmHg.   Comparison(s): EF 60%, mild MAC, moderate aortic sclerosis with no  evidence of stenosis, IAS lipomatous.   12/29/2020 CT Chest new small pericardial effusion noted.   CT Chest 12/29/2020: COMPARISON:  12/28/2019.   FINDINGS: Cardiovascular: Atherosclerotic calcification of the aorta, aortic valve and coronary arteries. Heart size normal. Small pericardial effusion, new.   Mediastinum/Nodes: Noncalcified mediastinal lymph nodes measure up to 10 mm in the low right paratracheal station, unchanged. Calcified mediastinal and hilar lymph nodes. Hilar regions are otherwise difficult to evaluate without IV contrast. No axillary adenopathy. Esophagus is grossly unremarkable.   Lungs/Pleura: Emphysema. Extensive upper and midlung zone predominant central pulmonary retraction, traction bronchiectasis and architectural distortion, as before. Additional areas of peribronchovascular ground-glass and nodularity, also similar. No pleural fluid. Airway is otherwise unremarkable.   Upper Abdomen: Visualized portions of the liver, gallbladder, adrenal glands and right kidney are unremarkable. Atrophic left kidney with a subcentimeter low-attenuation lesion in the upper pole, too small to characterize. Visualized portions of the spleen, pancreas, stomach and bowel are grossly  unremarkable. A stent is seen in the right renal artery. Abdominal retroperitoneal lymph nodes measure up to 10 mm in the left periaortic station, unchanged. Calcified gastrohepatic ligament lymph node.   Musculoskeletal: There are scattered hemangiomas in the spine. No worrisome lytic or sclerotic lesions.   IMPRESSION: 1. Mediastinal, hilar and pulmonary parenchymal findings of sarcoid, unchanged from 12/28/2019. 2. New small pericardial effusion. 3. Aortic atherosclerosis (ICD10-I70.0). Coronary artery calcification. 4.  Emphysema (ICD10-J43.9).  Renal Angiography 11/29/2020: 1.  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 2.  Successful angioplasty and stent placement to the right renal artery.   Recommendations: Dual antiplatelet therapy for few months. Monitor blood pressure closely and adjust antihypertensive medications as needed.  EKG:  EKG is personally reviewed. 09/25/2021:  EKG was not ordered. 03/29/2021: EKG was not ordered. 08/15/2020: sinus rhythm rate 85 bpm  Recent Labs: 01/13/2022: Magnesium 2.1 04/30/2022: Hemoglobin 12.5; Platelets 308 07/23/2022: ALT 14; BUN 23; Creatinine, Ser 0.84; Potassium 4.0; Sodium 140   Recent Lipid Panel    Component Value Date/Time   CHOL 119 07/23/2022 0819   TRIG 103 07/23/2022 0819   HDL 46 07/23/2022 0819   CHOLHDL 2.6 07/23/2022 0819   CHOLHDL 2 05/23/2022 0929   VLDL 30.8 05/23/2022 0929   LDLCALC 54 07/23/2022 0819    Physical Exam:    VS:  BP (!) 154/78 (BP Location: Left Arm, Patient Position: Sitting, Cuff Size: Normal)   Pulse 74   Ht 5' 2.75" (1.594 m)   Wt 127 lb 1.6 oz (57.7 kg)   SpO2 97%   BMI 22.69 kg/m  , BMI Body mass index is 22.69 kg/m. GENERAL:  Well appearing HEENT: Pupils equal round and reactive, fundi not visualized, oral mucosa unremarkable NECK:  No jugular venous distention, waveform within normal limits, carotid upstroke brisk and  symmetric, no bruits LUNGS:  CTAB.  No crackles, rhonchi or wheezes.  HEART:  RRR.  PMI not displaced or sustained,S1 and S2 within normal limits, no S3, no S4, no  clicks, no rubs, II/VI systolic murmur at the LUSB. ABD:  Flat, positive bowel sounds normal in frequency in pitch, no bruits, no rebound, no guarding, no midline pulsatile mass, no hepatomegaly, no splenomegaly EXT:  2 plus pulses throughout, no edema, no cyanosis no clubbing SKIN:  No rashes no nodules NEURO:  Cranial nerves II through XII grossly intact, motor grossly intact throughout PSYCH:  Cognitively intact, oriented to person place and time   ASSESSMENT/PLAN:    HTN (hypertension) Blood pressure controlled in the AM and high in the evening.  She also has some fatigue and I wonder if it is due to her beta-blocker.  We will try switching her to carvedilol 12.5 mg twice daily.  If she does not tolerate this or notice an improvement in her fatigue, consider switching to nebivolol.  Prior to her surgery she was on amlodipine/valsartan/HCTZ at 10/160/12.5 mg daily.  I suspect we will need to increase her antihypertensive regimen.  Consider adding ARB at follow-up if her blood pressure remains uncontrolled.  She struggles with a lot of bladder issues so would like to avoid the diuretic if possible.  Renal artery stenosis (HCC) Renal artery stent patent 06/2022.  She follows with Dr. Kirke Suarez.  Hx of CABG She continues to struggle with recovery after coronary artery bypass grafting.  She is encouraged to continue exercising.  Lipids are well-controlled on rosuvastatin and Repatha.  Continue aspirin, amlodipine, and switch metoprolol to carvedilol as above.  The discomforts she is feeling seem to be musculoskeletal and postsurgical in nature.  No evidence of ischemia.  Will check a TSH given her report of fatigue and weight loss.   Screening for Secondary Hypertension:     08/15/2020   10:31 AM  Causes  Drugs/Herbals Screened     -  Comments no issues  Renovascular HTN Screened     - Comments check renal Dopplers  Sleep Apnea Screened     - Comments no triggers    Relevant Labs/Studies:    Latest Ref Rng & Units 07/23/2022    8:19 AM 04/30/2022   11:38 AM 01/12/2022   12:59 AM  Basic Labs  Sodium 134 - 144 mmol/L 140  140  135   Potassium 3.5 - 5.2 mmol/L 4.0  4.5  4.7   Creatinine 0.57 - 1.00 mg/dL 4.09  8.11  9.14        Latest Ref Rng & Units 02/29/2020    9:12 AM 04/21/2019   11:28 AM  Thyroid   TSH 0.35 - 4.50 uIU/mL 2.32  1.87                 06/21/2022    2:13 PM  Renovascular   Renal Artery [place] Completed Yes      Disposition: FU with Hassaan Crite C. Duke Salvia, MD, [facility] in 6 months. Kristen Shields, NP in 1-2 months.    Medication Adjustments/Labs and Tests Ordered: Current medicines are reviewed at length with the patient today.  Concerns regarding medicines are outlined above.   No orders of the defined types were placed in this encounter.  Meds ordered this encounter  Medications   carvedilol (COREG) 12.5 MG tablet    Sig: Take 1 tablet (12.5 mg total) by mouth 2 (two) times daily.    Dispense:  180 tablet    Refill:  3     Signed, Kristen Si, MD  07/30/2022 10:53 AM    [facility]

## 2022-07-30 NOTE — Assessment & Plan Note (Signed)
Renal artery stent patent 06/2022.  She follows with Dr. Kirke Corin.

## 2022-07-30 NOTE — Assessment & Plan Note (Signed)
Blood pressure controlled in the AM and high in the evening.  She also has some fatigue and I wonder if it is due to her beta-blocker.  We will try switching her to carvedilol 12.5 mg twice daily.  If she does not tolerate this or notice an improvement in her fatigue, consider switching to nebivolol.  Prior to her surgery she was on amlodipine/valsartan/HCTZ at 10/160/12.5 mg daily.  I suspect we will need to increase her antihypertensive regimen.  Consider adding ARB at follow-up if her blood pressure remains uncontrolled.  She struggles with a lot of bladder issues so would like to avoid the diuretic if possible.

## 2022-07-30 NOTE — Addendum Note (Signed)
Addended by: Marlene Lard on: 07/30/2022 11:00 AM   Modules accepted: Orders

## 2022-07-30 NOTE — Patient Instructions (Signed)
Medication Instructions:  Your physician has recommended you make the following change in your medication:   Stop: Metoprolol   Stop: jardiance   Start: Carvedilol 12.5mg  twice daily  *If you need a refill on your cardiac medications before your next appointment, please call your pharmacy*  Follow-Up: At [facility], you and your health needs are our priority.  As part of our continuing mission to provide you with exceptional heart care, we have created designated Provider Care Teams.  These Care Teams include your primary Cardiologist (physician) and Advanced Practice Providers (APPs -  Physician Assistants and Nurse Practitioners) who all work together to provide you with the care you need, when you need it.  We recommend signing up for the patient portal called "MyChart".  Sign up information is provided on this After Visit Summary.  MyChart is used to connect with patients for Virtual Visits (Telemedicine).  Patients are able to view lab/test results, encounter notes, upcoming appointments, etc.  Non-urgent messages can be sent to your provider as well.   To learn more about what you can do with MyChart, go to [email or web address].    Your next appointment:   1 month(s)  Provider:   Gillian Shields, NP    Other Instructions Please track your blood pressure once per day about 2 hours after medication and bring your log with you to your follow up appointment.

## 2022-07-31 LAB — TSH: TSH: 1.66 u[IU]/mL (ref 0.450–4.500)

## 2022-08-01 ENCOUNTER — Encounter: Payer: Self-pay | Admitting: Rheumatology

## 2022-08-01 ENCOUNTER — Encounter: Payer: Medicare Other | Attending: Cardiovascular Disease | Admitting: Rheumatology

## 2022-08-01 VITALS — BP 172/82 | HR 71 | Resp 14 | Ht 62.75 in | Wt 127.2 lb

## 2022-08-01 DIAGNOSIS — I10 Essential (primary) hypertension: Secondary | ICD-10-CM | POA: Diagnosis present

## 2022-08-01 DIAGNOSIS — M62838 Other muscle spasm: Secondary | ICD-10-CM | POA: Insufficient documentation

## 2022-08-01 DIAGNOSIS — M19071 Primary osteoarthritis, right ankle and foot: Secondary | ICD-10-CM | POA: Insufficient documentation

## 2022-08-01 DIAGNOSIS — M19072 Primary osteoarthritis, left ankle and foot: Secondary | ICD-10-CM | POA: Insufficient documentation

## 2022-08-01 DIAGNOSIS — Z961 Presence of intraocular lens: Secondary | ICD-10-CM | POA: Diagnosis present

## 2022-08-01 DIAGNOSIS — J42 Unspecified chronic bronchitis: Secondary | ICD-10-CM | POA: Diagnosis present

## 2022-08-01 DIAGNOSIS — M8589 Other specified disorders of bone density and structure, multiple sites: Secondary | ICD-10-CM | POA: Diagnosis present

## 2022-08-01 DIAGNOSIS — G252 Other specified forms of tremor: Secondary | ICD-10-CM | POA: Insufficient documentation

## 2022-08-01 DIAGNOSIS — Z8639 Personal history of other endocrine, nutritional and metabolic disease: Secondary | ICD-10-CM | POA: Diagnosis present

## 2022-08-01 DIAGNOSIS — Z8719 Personal history of other diseases of the digestive system: Secondary | ICD-10-CM | POA: Insufficient documentation

## 2022-08-01 DIAGNOSIS — L821 Other seborrheic keratosis: Secondary | ICD-10-CM | POA: Diagnosis present

## 2022-08-01 DIAGNOSIS — I701 Atherosclerosis of renal artery: Secondary | ICD-10-CM | POA: Insufficient documentation

## 2022-08-01 DIAGNOSIS — M19041 Primary osteoarthritis, right hand: Secondary | ICD-10-CM | POA: Diagnosis not present

## 2022-08-01 DIAGNOSIS — Z951 Presence of aortocoronary bypass graft: Secondary | ICD-10-CM | POA: Insufficient documentation

## 2022-08-01 DIAGNOSIS — M19042 Primary osteoarthritis, left hand: Secondary | ICD-10-CM | POA: Diagnosis present

## 2022-08-01 DIAGNOSIS — Z820 Family history of epilepsy and other diseases of the nervous system: Secondary | ICD-10-CM | POA: Diagnosis present

## 2022-08-01 DIAGNOSIS — D869 Sarcoidosis, unspecified: Secondary | ICD-10-CM | POA: Insufficient documentation

## 2022-08-01 DIAGNOSIS — M546 Pain in thoracic spine: Secondary | ICD-10-CM | POA: Diagnosis not present

## 2022-08-01 DIAGNOSIS — M4004 Postural kyphosis, thoracic region: Secondary | ICD-10-CM | POA: Insufficient documentation

## 2022-08-01 DIAGNOSIS — Z86010 Personal history of colonic polyps: Secondary | ICD-10-CM | POA: Diagnosis present

## 2022-08-06 ENCOUNTER — Encounter (HOSPITAL_BASED_OUTPATIENT_CLINIC_OR_DEPARTMENT_OTHER): Payer: Self-pay | Admitting: Cardiovascular Disease

## 2022-08-06 ENCOUNTER — Encounter: Payer: Self-pay | Admitting: Emergency Medicine

## 2022-08-07 ENCOUNTER — Encounter: Payer: Self-pay | Admitting: Internal Medicine

## 2022-08-09 NOTE — Telephone Encounter (Signed)
Pt's vaccine record updated. Nothing further needed at this time.

## 2022-08-14 ENCOUNTER — Encounter: Payer: Self-pay | Admitting: Nurse Practitioner

## 2022-08-14 ENCOUNTER — Ambulatory Visit (INDEPENDENT_AMBULATORY_CARE_PROVIDER_SITE_OTHER): Payer: Medicare Other | Admitting: Nurse Practitioner

## 2022-08-14 VITALS — BP 160/72 | HR 86 | Temp 98.6°F | Resp 16 | Ht 62.75 in | Wt 124.6 lb

## 2022-08-14 DIAGNOSIS — M8589 Other specified disorders of bone density and structure, multiple sites: Secondary | ICD-10-CM

## 2022-08-14 DIAGNOSIS — R7301 Impaired fasting glucose: Secondary | ICD-10-CM

## 2022-08-14 DIAGNOSIS — J310 Chronic rhinitis: Secondary | ICD-10-CM

## 2022-08-14 DIAGNOSIS — N1832 Chronic kidney disease, stage 3b: Secondary | ICD-10-CM

## 2022-08-14 DIAGNOSIS — R351 Nocturia: Secondary | ICD-10-CM

## 2022-08-14 DIAGNOSIS — I10 Essential (primary) hypertension: Secondary | ICD-10-CM | POA: Diagnosis not present

## 2022-08-14 DIAGNOSIS — F5102 Adjustment insomnia: Secondary | ICD-10-CM

## 2022-08-14 DIAGNOSIS — B3741 Candidal cystitis and urethritis: Secondary | ICD-10-CM

## 2022-08-14 LAB — POCT GLYCOSYLATED HEMOGLOBIN (HGB A1C): Hemoglobin A1C: 5.9 % — AB (ref 4.0–5.6)

## 2022-08-14 MED ORDER — IPRATROPIUM BROMIDE 0.03 % NA SOLN
1.0000 | Freq: Two times a day (BID) | NASAL | 0 refills | Status: DC
Start: 2022-08-14 — End: 2022-12-31

## 2022-08-14 MED ORDER — OXYBUTYNIN CHLORIDE ER 5 MG PO TB24
5.0000 mg | ORAL_TABLET | Freq: Every day | ORAL | 0 refills | Status: DC
Start: 2022-08-14 — End: 2022-10-29

## 2022-08-14 NOTE — Patient Instructions (Addendum)
Call office for ditropan refill if it improves nocturia. Go to lab Maintain appt with cardiology and urogyn.

## 2022-08-14 NOTE — Progress Notes (Signed)
Established Patient Visit  Patient: Kristen Suarez   DOB: 10/07/1945   77 y.o. Female  MRN: [id number] Visit Date: 08/19/2022  Subjective:    Chief Complaint  Patient presents with   Hypertension    Medication refill - nasonex    HPI Adjustment insomnia Chronic Insomnia: Onset S/p heart surgery 8months ago. Difficulty staying asleep, interruptted use of nocturia (3-4x/night), completed pelvic rehab 2022 which helped with nocturia, appt with [facility] 08/27/2022). Reports daytime somnolence with benadyl No incontinence No hx of glaucoma. D/c jardiance 20month ago.  Advised to try melatonin prn at hs  Nocturia more than twice per night Onset 8months ago, Difficulty staying asleep due to nocturia (3-4x/night), completed pelvic rehab 2022 which helped with nocturia, appt with [facility] 08/27/2022). No incontinence, No hx of glaucoma.no constipation, no diarrhea D/c jardiance 20month ago. But no improvement in nocturia No caffeine, no ALCOHOL, no sodas She Stops liquid intake at 7pm, bedtime at 9-10pm Discussed with pulmonology and cardiology: Metoprolol switched to coreg.  Repeat urinalysis today: positive yeast, negative bacterial growth. Diflucan sent Start ditropan  Chronic kidney disease, stage 3b (HCC) stable  HTN (hypertension) BP not at goal today. Home BP readings: 100s-140s/50s-80s Pulse: 60s-70s Compliant with meds and DASH diet BP Readings from Last 3 Encounters:  08/14/22 (!) 160/72  08/01/22 (!) 172/82  07/30/22 (!) 154/78    Advised to f/up with cardiology  Post-nasal drip Persistent rhinitis without sinus congestion. Minimal improvement with nasonex  Atrovent sent   Wt Readings from Last 3 Encounters:  08/14/22 124 lb 9.6 oz (56.5 kg)  08/01/22 127 lb 3.2 oz (57.7 kg)  07/30/22 127 lb 1.6 oz (57.7 kg)    Reviewed medical, surgical, and social history today  Medications: Outpatient Medications Prior to Visit  Medication  Sig   acetaminophen (TYLENOL) 500 MG tablet Take 500 mg by mouth every 6 (six) hours as needed for moderate pain.   albuterol (PROVENTIL) (2.5 MG/3ML) 0.083% nebulizer solution INHALE (2.5MG ) VIA NEBULIZER EVERY 6 HOURS AS NEEDED FOR WHEEZING/SHORTNESS OF BREATH   amLODipine (NORVASC) 5 MG tablet Take 1 tablet (5 mg total) by mouth daily.   aspirin EC 81 MG tablet Take 1 tablet (81 mg total) by mouth daily. Swallow whole.   BREZTRI AEROSPHERE 160-9-4.8 MCG/ACT AERO INHALE 2 PUFFS INTO THE LUNGS IN THE MORNING AND AT BEDTIME.   Calcium Carb-Cholecalciferol (CALCIUM 600 + D PO) Take 1 tablet by mouth daily.   carvedilol (COREG) 12.5 MG tablet Take 1 tablet (12.5 mg total) by mouth 2 (two) times daily.   cetirizine (ZYRTEC) 10 MG tablet Take 10 mg by mouth daily as needed for allergies.   docusate sodium (COLACE) 100 MG capsule Take 100 mg by mouth daily.    Evolocumab (REPATHA SURECLICK) 140 MG/ML SOAJ Inject 140 mg into the skin every 14 (fourteen) days.   mometasone (NASONEX) 50 MCG/ACT nasal spray SHAKE LIQUID AND USE 2 SPRAYS IN EACH NOSTRIL DAILY   Multiple Vitamins-Minerals (MULTIVITAL PO) Take 1 tablet by mouth daily.    pantoprazole (PROTONIX) 40 MG tablet TAKE 1 TABLET BY MOUTH EVERY DAY   Probiotic Product (PROBIOTIC DAILY PO) Take 1 tablet by mouth daily.    rosuvastatin (CRESTOR) 40 MG tablet TAKE 1 TABLET BY MOUTH EVERY DAY   sodium chloride (OCEAN) 0.65 % SOLN nasal spray Place 1 spray into both nostrils as needed for congestion.   No  facility-administered medications prior to visit.   Reviewed past medical and social history.   ROS per HPI above      Objective:  BP (!) 160/72   Pulse 86   Temp 98.6 F (37 C) (Temporal)   Resp 16   Ht 5' 2.75" (1.594 m)   Wt 124 lb 9.6 oz (56.5 kg)   SpO2 96%   BMI 22.25 kg/m      Physical Exam Vitals and nursing note reviewed.  Cardiovascular:     Rate and Rhythm: Normal rate and regular rhythm.     Pulses: Normal pulses.      Heart sounds: Normal heart sounds.  Pulmonary:     Effort: Pulmonary effort is normal.     Breath sounds: Normal breath sounds.  Abdominal:     General: Bowel sounds are normal. There is no distension.     Palpations: Abdomen is soft.     Tenderness: There is no abdominal tenderness. There is no right CVA tenderness, left CVA tenderness or guarding.  Neurological:     Mental Status: She is alert and oriented to person, place, and time.     Results for orders placed or performed in visit on 08/14/22  Urine Culture  Result Value Ref Range   MICRO NUMBER: [id number]    SPECIMEN QUALITY: Adequate    Sample Source URINE    STATUS: FINAL    Result:      Mixed genital flora isolated. These superficial bacteria are not indicative of a urinary tract infection. No further organism identification is warranted on this specimen. If clinically indicated, recollect clean-catch, mid-stream urine and transfer  immediately to Urine Culture Transport Tube.   Urinalysis w microscopic + reflex cultur   Specimen: Urine  Result Value Ref Range   Color, Urine DARK YELLOW YELLOW   APPearance TURBID (A) CLEAR   Specific Gravity, Urine 1.021 1.001 - 1.035   pH 6.0 5.0 - 8.0   Glucose, UA NEGATIVE NEGATIVE   Bilirubin Urine NEGATIVE NEGATIVE   Ketones, ur NEGATIVE NEGATIVE   Hgb urine dipstick TRACE (A) NEGATIVE   Protein, ur 2+ (A) NEGATIVE   Nitrites, Initial NEGATIVE NEGATIVE   Leukocyte Esterase 3+ (A) NEGATIVE   WBC, UA PACKED (A) 0 - 5 /HPF   RBC / HPF 0-2 0 - 2 /HPF   Squamous Epithelial / HPF 0-5 < OR = 5 /HPF   Bacteria, UA MODERATE (A) NONE SEEN /HPF   Hyaline Cast NONE SEEN NONE SEEN /LPF   Yeast MODERATE (A) NONE SEEN /HPF   Note    Extra Urine Specimen  Result Value Ref Range   Extra Urine Specimen    REFLEXIVE URINE CULTURE  Result Value Ref Range   REFLEXIVE URINE CULTURE    POCT glycosylated hemoglobin (Hb A1C)  Result Value Ref Range   Hemoglobin A1C 5.9 (A) 4.0 - 5.6 %    HbA1c POC (<> result, manual entry)     HbA1c, POC (prediabetic range)     HbA1c, POC (controlled diabetic range)        Assessment & Plan:    Problem List Items Addressed This Visit       Cardiovascular and Mediastinum   HTN (hypertension) - Primary    BP not at goal today. Home BP readings: 100s-140s/50s-80s Pulse: 60s-70s Compliant with meds and DASH diet BP Readings from Last 3 Encounters:  08/14/22 (!) 160/72  08/01/22 (!) 172/82  07/30/22 (!) 154/78    Advised to f/up  with cardiology        Musculoskeletal and Integument   Osteopenia of multiple sites   Relevant Orders   DG Bone Density     Genitourinary   Chronic kidney disease, stage 3b (HCC) (Chronic)    stable        Other   Adjustment insomnia    Chronic Insomnia: Onset S/p heart surgery 8months ago. Difficulty staying asleep, interruptted use of nocturia (3-4x/night), completed pelvic rehab 2022 which helped with nocturia, appt with [facility] 08/27/2022). Reports daytime somnolence with benadyl No incontinence No hx of glaucoma. D/c jardiance 17month ago.  Advised to try melatonin prn at hs      Nocturia more than twice per night    Onset 8months ago, Difficulty staying asleep due to nocturia (3-4x/night), completed pelvic rehab 2022 which helped with nocturia, appt with [facility] 08/27/2022). No incontinence, No hx of glaucoma.no constipation, no diarrhea D/c jardiance 17month ago. But no improvement in nocturia No caffeine, no ALCOHOL, no sodas She Stops liquid intake at 7pm, bedtime at 9-10pm Discussed with pulmonology and cardiology: Metoprolol switched to coreg.  Repeat urinalysis today: positive yeast, negative bacterial growth. Diflucan sent Start ditropan      Relevant Medications   oxybutynin (DITROPAN XL) 5 MG 24 hr tablet   fluconazole (DIFLUCAN) 150 MG tablet   Other Relevant Orders   Urinalysis w microscopic + reflex cultur (Completed)   POCT glycosylated hemoglobin (Hb  A1C) (Completed)   Post-nasal drip    Persistent rhinitis without sinus congestion. Minimal improvement with nasonex  Atrovent sent      Relevant Medications   ipratropium (ATROVENT) 0.03 % nasal spray   Other Visit Diagnoses     Impaired fasting blood sugar       Relevant Orders   POCT glycosylated hemoglobin (Hb A1C) (Completed)   Candida cystitis       Relevant Medications   fluconazole (DIFLUCAN) 150 MG tablet      Return in about 3 months (around 11/14/2022) for nocturia and hyperlipidemia (fasting), HTN.    Advised about possible side effects of ditorpan. She verbalized understanding. F/up with cardiology for BP management  Alysia Penna, NP

## 2022-08-15 LAB — CULTURE INDICATED

## 2022-08-15 LAB — URINALYSIS W MICROSCOPIC + REFLEX CULTURE
Bilirubin Urine: NEGATIVE
Hyaline Cast: NONE SEEN /LPF

## 2022-08-15 MED ORDER — FLUCONAZOLE 150 MG PO TABS
150.0000 mg | ORAL_TABLET | Freq: Every day | ORAL | 0 refills | Status: DC
Start: 2022-08-15 — End: 2022-10-29

## 2022-08-16 ENCOUNTER — Encounter: Payer: Self-pay | Admitting: Nurse Practitioner

## 2022-08-17 LAB — URINALYSIS W MICROSCOPIC + REFLEX CULTURE
Glucose, UA: NEGATIVE
Ketones, ur: NEGATIVE
Nitrites, Initial: NEGATIVE
Specific Gravity, Urine: 1.021 (ref 1.001–1.035)
pH: 6 (ref 5.0–8.0)

## 2022-08-17 LAB — URINE CULTURE
MICRO NUMBER:: 15019516
SPECIMEN QUALITY:: ADEQUATE

## 2022-08-17 LAB — EXTRA URINE SPECIMEN

## 2022-08-19 ENCOUNTER — Encounter: Payer: Self-pay | Admitting: Nurse Practitioner

## 2022-08-19 DIAGNOSIS — R351 Nocturia: Secondary | ICD-10-CM | POA: Insufficient documentation

## 2022-08-19 DIAGNOSIS — G4700 Insomnia, unspecified: Secondary | ICD-10-CM | POA: Insufficient documentation

## 2022-08-19 DIAGNOSIS — F5102 Adjustment insomnia: Secondary | ICD-10-CM | POA: Insufficient documentation

## 2022-08-19 NOTE — Assessment & Plan Note (Signed)
stable °

## 2022-08-19 NOTE — Assessment & Plan Note (Signed)
BP not at goal today. Home BP readings: 100s-140s/50s-80s Pulse: 60s-70s Compliant with meds and DASH diet BP Readings from Last 3 Encounters:  08/14/22 (!) 160/72  08/01/22 (!) 172/82  07/30/22 (!) 154/78    Advised to f/up with cardiology

## 2022-08-19 NOTE — Assessment & Plan Note (Addendum)
Chronic Insomnia: Onset S/p heart surgery 8months ago. Difficulty staying asleep, interruptted use of nocturia (3-4x/night), completed pelvic rehab 2022 which helped with nocturia, appt with [facility] 08/27/2022). Reports daytime somnolence with benadyl No incontinence No hx of glaucoma. D/c jardiance 59month ago.  Advised to try melatonin prn at hs

## 2022-08-19 NOTE — Assessment & Plan Note (Addendum)
Onset 8months ago, Difficulty staying asleep due to nocturia (3-4x/night), completed pelvic rehab 2022 which helped with nocturia, appt with [facility] 08/27/2022). No incontinence, No hx of glaucoma.no constipation, no diarrhea D/c jardiance 14month ago. But no improvement in nocturia No caffeine, no ALCOHOL, no sodas She Stops liquid intake at 7pm, bedtime at 9-10pm Discussed with pulmonology and cardiology: Metoprolol switched to coreg.  Repeat urinalysis today: positive yeast, negative bacterial growth. Diflucan sent Start ditropan

## 2022-08-19 NOTE — Assessment & Plan Note (Addendum)
Persistent rhinitis without sinus congestion. Minimal improvement with nasonex  Atrovent sent

## 2022-08-26 ENCOUNTER — Other Ambulatory Visit (HOSPITAL_BASED_OUTPATIENT_CLINIC_OR_DEPARTMENT_OTHER): Payer: Self-pay | Admitting: Family

## 2022-08-26 NOTE — Telephone Encounter (Signed)
Rx request sent to pharmacy.  

## 2022-08-30 ENCOUNTER — Ambulatory Visit (INDEPENDENT_AMBULATORY_CARE_PROVIDER_SITE_OTHER): Payer: Medicare Other | Admitting: Family

## 2022-08-30 ENCOUNTER — Encounter (HOSPITAL_BASED_OUTPATIENT_CLINIC_OR_DEPARTMENT_OTHER): Payer: Self-pay | Admitting: Family

## 2022-08-30 ENCOUNTER — Other Ambulatory Visit (HOSPITAL_BASED_OUTPATIENT_CLINIC_OR_DEPARTMENT_OTHER): Payer: Self-pay | Admitting: Family

## 2022-08-30 VITALS — BP 152/82 | HR 65 | Ht 62.75 in | Wt 125.8 lb

## 2022-08-30 DIAGNOSIS — I25118 Atherosclerotic heart disease of native coronary artery with other forms of angina pectoris: Secondary | ICD-10-CM

## 2022-08-30 DIAGNOSIS — E785 Hyperlipidemia, unspecified: Secondary | ICD-10-CM | POA: Diagnosis not present

## 2022-08-30 DIAGNOSIS — I701 Atherosclerosis of renal artery: Secondary | ICD-10-CM | POA: Diagnosis not present

## 2022-08-30 DIAGNOSIS — Z951 Presence of aortocoronary bypass graft: Secondary | ICD-10-CM

## 2022-08-30 DIAGNOSIS — I10 Essential (primary) hypertension: Secondary | ICD-10-CM

## 2022-08-30 DIAGNOSIS — R052 Subacute cough: Secondary | ICD-10-CM

## 2022-08-30 MED ORDER — BENZONATATE 100 MG PO CAPS
200.0000 mg | ORAL_CAPSULE | Freq: Three times a day (TID) | ORAL | 1 refills | Status: DC | PRN
Start: 2022-08-30 — End: 2022-09-18

## 2022-08-30 NOTE — Therapy (Unsigned)
OUTPATIENT PHYSICAL THERAPY FEMALE PELVIC EVALUATION   Patient Name: Kristen Suarez MRN: [id number] DOB:Oct 29, 1945, 77 y.o., female Today's Date: 09/02/2022  END OF SESSION:  PT End of Session - 09/02/22 1020     Visit Number 1    Date for PT Re-Evaluation 11/25/22    Authorization Type Medicare    Authorization - Visit Number 1    Authorization - Number of Visits 10    PT Start Time 1015    PT Stop Time 1055    PT Time Calculation (min) 40 min    Activity Tolerance Patient tolerated treatment well    Behavior During Therapy [facility] for tasks assessed/performed             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/1976   Past Surgical History:  Procedure Laterality Date   ABDOMINAL HYSTERECTOMY  1987   BASAL CELL CARCINOMA EXCISION  06/30/2020   nose   CARDIAC CATHETERIZATION     CLIPPING OF ATRIAL APPENDAGE N/A 01/04/2022   Procedure: CLIPPING OF ATRIAL APPENDAGE USING SIZE 40 ATRICURE ATRIAL CLIP;  Surgeon: Lyn Hollingshead, MD;  Location: [facility] OR;  Service: Open Heart Surgery;  Laterality: N/A;   CORONARY ARTERY BYPASS GRAFT N/A 01/04/2022   Procedure: CORONARY ARTERY BYPASS GRAFTING (CABG) x  USING ENDOSCOPIC GREATER SAPHENOUS VEIN HARVEST;  Surgeon: Lyn Hollingshead, MD;  Location: [facility] OR;  Service: Open Heart Surgery;  Laterality: N/A;   LEFT HEART CATH AND CORONARY ANGIOGRAPHY N/A 01/02/2022   Procedure: LEFT HEART CATH AND CORONARY ANGIOGRAPHY;  Surgeon: Lennette Bihari, MD;  Location: [facility] INVASIVE CV LAB;  Service: Cardiovascular;  Laterality: N/A;   LUNG BIOPSY  06/16/1976    PERIPHERAL VASCULAR INTERVENTION Right 11/29/2020   Procedure: PERIPHERAL VASCULAR INTERVENTION;  Surgeon: Iran Ouch, MD;  Location: [facility] INVASIVE CV LAB;  Service: Cardiovascular;  Laterality: Right;  Renal Artery   RENAL ANGIOGRAPHY N/A 11/29/2020   Procedure: RENAL ANGIOGRAPHY;  Surgeon: Iran Ouch, MD;  Location: [facility] INVASIVE CV LAB;  Service: Cardiovascular;  Laterality: N/A;   TEE WITHOUT CARDIOVERSION N/A 01/04/2022   Procedure: TRANSESOPHAGEAL ECHOCARDIOGRAM (TEE);  Surgeon: Lyn Hollingshead, MD;  Location: [facility];  Service: Open Heart Surgery;  Laterality: N/A;   Patient Active Problem List   Diagnosis Date Noted   Adjustment insomnia 08/19/2022   Nocturia more than twice per night 08/19/2022   Bilateral sensorineural hearing loss 06/27/2022   Hearing loss of right ear 02/04/2022   Hx of CABG 01/04/2022   Elevated coronary artery calcium score    Chronic kidney disease, stage 3b (HCC) 01/01/2022   Syncope 12/31/2021   Renal artery stenosis (HCC) 09/25/2021   Aortic valve sclerosis 09/25/2021   Snoring 04/13/2021   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   Epistaxis 03/29/2021   Coronary artery disease due to calcified coronary lesion 08/15/2020   Basal cell carcinoma 06/30/2020   History of loop recorder 12/13/2019   Allergic rhinitis 12/08/2019   Cough 11/08/2019  Post-nasal drip 11/08/2019   Multiple joint pain 04/22/2019   Bilateral leg edema 04/22/2019   Vaginal atrophy 01/05/2019   Resting tremor 09/08/2018   Sarcoidosis 05/08/2018   HTN (hypertension) 05/08/2018   Colon polyp 05/08/2018   Family hx of colon cancer 05/08/2018   Cystocele, midline 05/08/2018   Hyperlipidemia 05/08/2018   Vasovagal syncope 05/08/2018   Seborrheic keratosis 05/08/2018   Pseudophakia of both eyes 05/08/2018   GERD (gastroesophageal reflux disease) 05/08/2018   COPD (chronic obstructive pulmonary disease) (HCC) 05/08/2018   Osteopenia of multiple sites 12/31/2016    PCP:  Anne Ng, NP  REFERRING PROVIDER: Anne Ng, NP   REFERRING DIAG: N81.11 (ICD-10-CM) - Cystocele, midline   THERAPY DIAG:  Muscle weakness (generalized)  Other lack of coordination  Rationale for Evaluation and Treatment: Rehabilitation  ONSET DATE: 2/24  SUBJECTIVE:                                                                                                                                                                                           SUBJECTIVE STATEMENT: Since my heart surgery I have not exercised due to the surgery. 2/24 had increased pressure on the pelvic floor. I saw a urogynecologist. I have  started on estrace and it is helping. I lost my exercises. No pelvic pain just pressure.    PAIN:  Are you having pain? No   PRECAUTIONS: Other: no lifting more than 25#  WEIGHT BEARING RESTRICTIONS: No  FALLS:  Has patient fallen in last 6 months? No  LIVING ENVIRONMENT: Lives with: lives with their spouse   OCCUPATION: retired  PLOF: Independent  PATIENT GOALS: strengthen pelvic floor  PERTINENT HISTORY:  Abdominal hysterectomy; Basal cell carcinoma excision; coronary artery bypass graft; Hear surgery 01/04/22  BOWEL MOVEMENT: no issues and takes a stool softner 2 times per day.   URINATION: Pain with urination: No Fully empty bladder: No, ultrasound showed considerable about of urine left Stream:  average Urgency: Yes: sometimes, will have the feeling with not much urine during urination Frequency: urinates every 1.5 to 2 hours, night voids is 2-5 voids due to the urge Leakage:  2 leakages in the past month that was small and just happened Pads: Yes: when out of the house for an extended period  INTERCOURSE: not sexually active  PREGNANCY: Vaginal deliveries 3 Tearing No  PROLAPSE: Cystocele     OBJECTIVE:   DIAGNOSTIC FINDINGS:  Ultrasound of the bladder showed significant amount of urine left in the  bladder   COGNITION: Overall cognitive status: Within functional limits for tasks assessed     SENSATION: Light touch: Appears  intact Proprioception: Appears intact   POSTURE: rounded shoulders, forward head, and increased thoracic kyphosis, scar on chest from heart surgery  PELVIC ALIGNMENT:correct alignment  LUMBARAROM/PROM: Lumbar ROM limited by 25% for all directions    LOWER EXTREMITY ROM: full hip ROM   LOWER EXTREMITY MMT: bilateral hip strength is 5/5   PALPATION:   General  difficulty with lower abdominal contraction                External Perineal Exam intact                             Internal Pelvic Floor contracts the anus more than the vagina  Patient confirms identification and approves PT to assess internal pelvic floor and treatment Yes  PELVIC MMT:   MMT eval  Vaginal 1/5  (Blank rows = not tested)        TONE: average  PROLAPSE: Anterior wall weakness just above the introitus  TODAY'S TREATMENT:                                                                                                                              DATE: 09/02/22  EVAL See below   PATIENT EDUCATION:  Education details: edcated patient on scar massage for her chest scar from open heart surgery, Access Code: [email or web address] Person educated: Patient Education method: Explanation, Demonstration, Tactile cues, Verbal cues, and Handouts Education comprehension: verbalized understanding, returned demonstration, verbal cues required, tactile cues required, and needs further education  HOME EXERCISE PROGRAM: 09/02/22 Access Code: [email or web address] URL: [email or web address] Date: 09/02/2022 Prepared by: Eulis Foster  Exercises - Supine Bridge with Pelvic Floor Contraction  - 2 x daily - 7 x weekly - 1 sets - 10 reps - Pelvic Floor Contractions in Hooklying with Adduction  - 2 x daily - 7 x weekly - 1 sets - 10 reps  ASSESSMENT:  CLINICAL IMPRESSION: Patient is a 77  y.o. female  who was seen today for physical therapy evaluation and treatment for cystocele. Patient has had an increase in pelvic floor pressure since 2/24.  She had open heart surgery on 10/23. She has a scar with limited mobility on her chest from the surgery. Patient is kyphotic with forward head and rounded shoulder reducing the distance from the rib cage to pubic bone putting pressure on the bladder. Patient has anterior wall weakness just above the introitus. Her pelvic floor strength is 1/5. Patient will contract the anus more than the vagina. She has leaked urine 2 times randomly in the past month. She has pressure feeling in the pelvic area. She has the urge to urinate every 1.5 to 2 hours and wakes up 2-5 times at night to urinate. Patient will benefit from skilled therapy to improve her posture to put less pressure on the pelvic organs and increase pelvic floor strength to support her anterior wall weakness.   OBJECTIVE IMPAIRMENTS: decreased coordination,  decreased strength, and increased fascial restrictions.   ACTIVITY LIMITATIONS: standing and continence  PARTICIPATION LIMITATIONS: community activity  PERSONAL FACTORS: Age, Time since onset of injury/illness/exacerbation, and 3+ comorbidities: Abdominal hysterectomy; Basal cell carcinoma excision; coronary artery bypass graft; Hear surgery 01/04/22  are also affecting patient's functional outcome.   REHAB POTENTIAL: Excellent  CLINICAL DECISION MAKING: Stable/uncomplicated  EVALUATION COMPLEXITY: Low   GOALS: Goals reviewed with patient? Yes  SHORT TERM GOALS: Target date: 09/29/22  Patient independent with initial HEP for pelvic floor contraction.  Baseline: Goal status: INITIAL  2.  Patient understands how to perform scar massage to her chest to increase pliability and increase distance from the rib cage to pubic bone.  Baseline:  Goal status: INITIAL  3.  Patient understands ways to manage her prolapse.  Baseline:   Goal status: INITIAL  4.  Patient educated on correct posture to reduce pressure on the anterior wall weakness.  Baseline:  Goal status: INITIAL    LONG TERM GOALS: Target date: 11/25/22  Patient is independent with advanced HEP for pelvic floor and core strength.  Baseline:  Goal status: INITIAL  2.  Pelvic floor strength >/= 3/5 to support her anterior wall weakness.  Baseline:  Goal status: INITIAL  3.  Patient is able to wait 2-3 hours to urinate due to decreased in her urgency to urinate.  Baseline:  Goal status: INITIAL  4.  Patient wakes up 1-2 times per night instead of 5 times due to reduction of urgency.  Baseline:  Goal status: INITIAL   PLAN:  PT FREQUENCY: 1x/week  PT DURATION: 12 weeks  PLANNED INTERVENTIONS: Therapeutic exercises, Therapeutic activity, Neuromuscular re-education, Patient/Family education, Joint mobilization, Dry Needling, Spinal mobilization, Cryotherapy, Moist heat, scar mobilization, Taping, Biofeedback, and Manual therapy  PLAN FOR NEXT SESSION: go over scar massage, work on posture, manual work to pelvic floor and diaphragm; RUSI   Eulis Foster, PT 09/02/22 1:58 PM

## 2022-08-30 NOTE — Progress Notes (Signed)
Home BP readings 110s-130s systolic

## 2022-08-30 NOTE — Patient Instructions (Addendum)
Medication Instructions:  Your physician has recommended you make the following change in your medication:   START Tessalon Pearles as needed for cough. Start with one tablet. If it does not improve cough, okay to take a second tablet.   *If you need a refill on your cardiac medications before your next appointment, please call your pharmacy*  Follow-Up: At [facility], you and your health needs are our priority.  As part of our continuing mission to provide you with exceptional heart care, we have created designated Provider Care Teams.  These Care Teams include your primary Cardiologist (physician) and Advanced Practice Providers (APPs -  Physician Assistants and Nurse Practitioners) who all work together to provide you with the care you need, when you need it.  We recommend signing up for the patient portal called "MyChart".  Sign up information is provided on this After Visit Summary.  MyChart is used to connect with patients for Virtual Visits (Telemedicine).  Patients are able to view lab/test results, encounter notes, upcoming appointments, etc.  Non-urgent messages can be sent to your provider as well.   To learn more about what you can do with MyChart, go to [email or web address].    Your next appointment:   As scheduled with Dr. Graciela Husbands AND In 6 months with Dr. Duke Salvia  Other Instructions  If you feel your fatigue is persistent let us know and we could trail changing your Carvedilol to Nebivolol.   If energy level does not improve with treating urinary frequency recommend discussing your prior sleep study with Dr. Delton Coombes at your next office visit.   Alver Sorrow, NP will reach out to your PCP and Dr. Delton Coombes about your nasal congestion.

## 2022-08-30 NOTE — Progress Notes (Unsigned)
Cardiology Office Note:  .   Date:  08/30/2022  ID:  Kristen Suarez, DOB 1945/06/21, MRN [id number] PCP: Anne Ng, NP  [facility] Providers Cardiologist:  Chilton Si, MD Electrophysiologist:  Sherryl Manges, MD { Click to update primary MD,subspecialty MD or APP then REFRESH:1}   History of Present Illness: .   Kristen Suarez is a 77 y.o. female  with a hx of renovascular hypertension, syncope, CAD s/p CABGx4, pulmonary sarcoidosis, asthma, hyperlipidemia  last seen 07/30/22.  She had ILR placed in [place] January 2019 due to syncope. This is now followed by Dr. Graciela Husbands. She was referred by her primary care provider to Dr. Duke Salvia for management of hypertension. Renal artery duplex June 2022 with significant right renal artery stenosis with peak velocity 291 with bruit, no flow into left renal artery. She had angiography with Dr. Kirke Corin in September 2022 with chronically occluded left renal artery with severe ostial stenosis in right renal artery with sucessful angioplasty and stent placement to right renal artery. A few days post procedure she had hypotension and near syncope, HCTZ and Spironolactone were discontinued.   Subsequently developed hypertension and Amlodipine, Irbesartan increased.  03/2021 Irbesartan increased to 150mg  BID.   At follow up 09/2021 she was transitioned to Amlodipine/Valsartan/HCTZ (10/160/12.5) to limit pill burden. Seen 10/29/21 by Dr. Graciela Husbands. Due to dyspnea [name] added for HFpEF and cardiac CT ordered and performed 11/20/21 showing severe CAD. Admitted 12/31/21 due to syncope. Subsequent LHC 01/02/22 with severely calcified coronary arteries with multivessel CAD recommended for CABG. 01/04/22 underwent CABG x 4 (LIMA - LAD, SVG - OM, SVG - PDA, SVG - Diag) and Left atrial appendage closure (40mm Atriclip). Echo with LVEF >75%, trivial MR, small PFO not excluded. Carotid duplex with no significant stsenosis. Did have postoperative atrial fibrillation  and was discharged on Amiodarone which was able to be discontinued.  Last saw Dr. Duke Salvia 07/30/2022 with BP 154/78.  Was noted be controlled morning and high in the evening.  Metoprolol was transitioned to Coreg 12.5mg  BID. Persistent chest discomfort was deemed musculoskeletal.   Presents today for follow up independently. She is continued to be bothered by fatigue after since her CABG. Chest pain is still present but not as bad. Describes as a midsternal "heaviness". Most often relieved by Tylenol and Biofreeze. Still fatigued with tiring easily. She has been exercising at [employer] 3x per week and has been able to increase time exercising and intensities on the machines. Blood pressure at home has been *** 110s-130s/60-70s in the morning and afternoon checked twice per day. One episode of vasovagal lightheadedness a couple weeks ago with no loss of consciousness - BP during episode as low as 66/40 which improved to 123/62.   She reports feeling "more short of breath than I would like" Frustrated by only being able to clean 1 bathroom and not 2 consecutively.   Very little snoring. ***  STill with runny nose. Has taken Delsym, Muccinex, Robisutssin  Ran a fever for 2 hours Tuesday  night Some improvement with atrovent.   Son, DIL RN. Eldest with nutrition  No swelling, orthopnea, PND.   Waking up 3-5x a night to go to the bathroom  Saw primary care 08/14/22 with congestion. Cough has gotten worse since last seen.  Congestion, drippy nose (clear) Persistent cough which is intermittently productive with light yellow Doesn't feel like infection yet but irritation   Stopped jardiance -0 urinary issues - pending pevlic floor PT  Home cuff previously  found to read 9 points higher.   Consider nebivolol, consider ARB at f/u  ROS: Please see the history of present illness.    All other systems reviewed and are negative.   Studies Reviewed: Marland Kitchen    EKG:  EKG is not ordered today.    Cardiac Studies & Procedures   CARDIAC CATHETERIZATION  CARDIAC CATHETERIZATION 01/02/2022  Narrative   Prox Cx to Mid Cx lesion is 70% stenosed.   1st Mrg lesion is 85% stenosed.   2nd Mrg lesion is 80% stenosed.   Mid LM to Dist LM lesion is 20% stenosed.   Ost LAD to Mid LAD lesion is 60% stenosed.   1st Diag lesion is 90% stenosed.   Mid LAD lesion is 80% stenosed.   Prox RCA-1 lesion is 80% stenosed.   Prox RCA-2 lesion is 95% stenosed.   Mid RCA lesion is 70% stenosed.   Mid RCA to Dist RCA lesion is 80% stenosed.   3rd RPL lesion is 80% stenosed.  Severely calcified coronary arteries with multivessel CAD with calcification extending into the distal left main, diffuse 90% first diagonal stenosis, 60 to 80% proximal to mid LAD stenoses; left circumflex stenoses of 85% in a moderate-sized first marginal,70% in the circumflex before the OM 2 with 80% OM 2 stenosis; and severely calcified diffuse disease in the proximal extending into the mid distal RCA with 80% focal PLA stenosis.  LVEDP 22 mmHg.  RECOMMENDATION: Surgical consultation for CABG revascularization surgery.  Findings Coronary Findings Diagnostic  Dominance: Right  Left Main Mid LM to Dist LM lesion is 20% stenosed.  Left Anterior Descending Ost LAD to Mid LAD lesion is 60% stenosed. Mid LAD lesion is 80% stenosed.  First Diagonal Branch Vessel is small in size. 1st Diag lesion is 90% stenosed.  Left Circumflex Prox Cx to Mid Cx lesion is 70% stenosed.  First Obtuse Marginal Branch 1st Mrg lesion is 85% stenosed.  Second Obtuse Marginal Branch 2nd Mrg lesion is 80% stenosed.  Right Coronary Artery Prox RCA-1 lesion is 80% stenosed. Prox RCA-2 lesion is 95% stenosed. Mid RCA lesion is 70% stenosed. Mid RCA to Dist RCA lesion is 80% stenosed.  Acute Marginal Branch Vessel is small in size.  Third Right Posterolateral Branch 3rd RPL lesion is 80% stenosed.  Intervention  No interventions  have been documented.     ECHOCARDIOGRAM  ECHOCARDIOGRAM COMPLETE 01/01/2022  Narrative ECHOCARDIOGRAM REPORT    Patient Name:   Kristen Suarez Date of Exam: 01/01/2022 Medical Rec #:  [id number]         Height:       62.0 in Accession #:    [id number]        Weight:       138.0 lb Date of Birth:  [birth year]         BSA:          1.633 m Patient Age:    76 years          BP:           153/97 mmHg Patient Gender: F                 HR:           82 bpm. Exam Location:  Inpatient  Procedure: 2D Echo, Color Doppler and Cardiac Doppler  Indications:    Stroke i63.9  History:        Patient has prior history of Echocardiogram examinations, most recent 02/16/2021. CAD,  COPD; Risk Factors:Hypertension and Dyslipidemia.  Sonographer:    Irving Kristen Senior RDCS Referring Phys: [id number] Jonita Albee  IMPRESSIONS   1. Left ventricular ejection fraction, by estimation, is >75%. The left ventricle has hyperdynamic function. The left ventricle has no regional wall motion abnormalities. Left ventricular diastolic parameters are consistent with Grade I diastolic dysfunction (impaired relaxation). 2. Right ventricular systolic function is mildly reduced. The right ventricular size is normal. 3. The mitral valve is abnormal. Trivial mitral valve regurgitation. 4. The aortic valve is tricuspid. Aortic valve regurgitation is not visualized. 5. The inferior vena cava is normal in size with greater than 50% respiratory variability, suggesting right atrial pressure of 3 mmHg. 6. Cannot exclude a small PFO.  Comparison(s): Changes from prior study are noted. 02/16/2021: LVEF 60-65%.  FINDINGS Left Ventricle: Left ventricular ejection fraction, by estimation, is >75%. The left ventricle has hyperdynamic function. The left ventricle has no regional wall motion abnormalities. The left ventricular internal cavity size was normal in size. There is no left ventricular hypertrophy. Left ventricular  diastolic parameters are consistent with Grade I diastolic dysfunction (impaired relaxation). Indeterminate filling pressures.  Right Ventricle: The right ventricular size is normal. No increase in right ventricular wall thickness. Right ventricular systolic function is mildly reduced.  Left Atrium: Left atrial size was normal in size.  Right Atrium: Right atrial size was normal in size.  Pericardium: There is no evidence of pericardial effusion.  Mitral Valve: The mitral valve is abnormal. There is mild calcification of the mitral valve leaflet(s). Trivial mitral valve regurgitation.  Tricuspid Valve: The tricuspid valve is grossly normal. Tricuspid valve regurgitation is trivial.  Aortic Valve: The aortic valve is tricuspid. Aortic valve regurgitation is not visualized.  Pulmonic Valve: The pulmonic valve was normal in structure. Pulmonic valve regurgitation is not visualized.  Aorta: The aortic root and ascending aorta are structurally normal, with no evidence of dilitation.  Venous: The inferior vena cava is normal in size with greater than 50% respiratory variability, suggesting right atrial pressure of 3 mmHg.  IAS/Shunts: Cannot exclude a small PFO.   LEFT VENTRICLE PLAX 2D LVIDd:         4.45 cm   Diastology LVIDs:         2.10 cm   LV e' medial:    6.42 cm/s LV PW:         0.75 cm   LV E/e' medial:  10.2 LV IVS:        0.70 cm   LV e' lateral:   6.20 cm/s LVOT diam:     2.00 cm   LV E/e' lateral: 10.6 LV SV:         85 LV SV Index:   52 LVOT Area:     3.14 cm   RIGHT VENTRICLE RV S prime:     8.16 cm/s TAPSE (M-mode): 1.8 cm  LEFT ATRIUM             Index        RIGHT ATRIUM           Index LA diam:        3.00 cm 1.84 cm/m   RA Area:     10.30 cm LA Vol (A2C):   28.4 ml 17.39 ml/m  RA Volume:   19.10 ml  11.70 ml/m LA Vol (A4C):   32.0 ml 19.60 ml/m LA Biplane Vol: 32.5 ml 19.90 ml/m AORTIC VALVE LVOT Vmax:   132.00 cm/s LVOT Vmean:  97.800  cm/s LVOT  VTI:    0.272 m  AORTA Ao Root diam: 2.50 cm Ao Asc diam:  3.50 cm  MITRAL VALVE MV Area (PHT): 2.27 cm    SHUNTS MV Decel Time: 334 msec    Systemic VTI:  0.27 m MV E velocity: 65.70 cm/s  Systemic Diam: 2.00 cm MV A velocity: 85.40 cm/s MV E/A ratio:  0.77  Zoila Shutter MD Electronically signed by Zoila Shutter MD Signature Date/Time: 01/01/2022/7:16:14 PM    Final   TEE  ECHO INTRAOPERATIVE TEE 01/10/2022  Narrative *INTRAOPERATIVE TRANSESOPHAGEAL REPORT *    Patient Name:   Kristen Suarez Date of Exam: 01/04/2022 Medical Rec #:  [id number]         Height:       62.0 in Accession #:    [id number]        Weight:       144.4 lb Date of Birth:  12/25/1945         BSA:          1.66 m Patient Age:    76 years          BP:           117/61 mmHg Patient Gender: F                 HR:           56 bpm. Exam Location:  Anesthesiology  Transesophogeal exam was perform intraoperatively during surgical procedure. Patient was closely monitored under general anesthesia during the entirety of examination.  Indications:     R07.9* Chest pain, unspecified Performing Phys: [id number] DANIEL H ENTER  Complications: No known complications during this procedure. POST-OP IMPRESSIONS _ Aorta: there is no dissection present in the aorta. _ Mitral Valve: There is trivial regurgitation. _ Comments: Imaging preformed by another provider and images inadequate for further evaluation.  PRE-OP FINDINGS Left Ventricle: The left ventricle has hyperdynamic systolic function, with an ejection fraction of >65%. The cavity size was normal. No evidence of left ventricular regional wall motion abnormalities. There is moderate concentric left ventricular hypertrophy.   Right Ventricle: The right ventricle has normal systolic function. The cavity was normal. There is increased right ventricular wall thickness.  Left Atrium: No left atrial/left atrial appendage thrombus was detected. The left  atrial appendage is well visualized and there is no evidence of thrombus present. Left atrial appendage velocity is reduced at less than 40 cm/s.  Interatrial Septum: No atrial level shunt detected by color flow Doppler. The interatrial septum appears to be lipomatous. There is no evidence of a patent foramen ovale.  Pericardium: A small pericardial effusion is present.  Mitral Valve: The mitral valve is normal in structure. Mitral valve regurgitation is not visualized by color flow Doppler. There is No evidence of mitral stenosis. There is mild thickening and mild calcification present on the mitral valve anterior cusp with normal mobility and there is mild thickening and mild calcification present on the mitral valve posterior cusp with normal mobility.  Tricuspid Valve: The tricuspid valve was normal in structure. Tricuspid valve regurgitation is mild by color flow Doppler. No evidence of tricuspid stenosis is present.  Aortic Valve: The aortic valve is tricuspid Aortic valve regurgitation was not visualized by color flow Doppler. There is mild stenosis of the aortic valve. There is mild thickening and moderate calcification present on the aortic valve right coronary, left coronary and non-coronary cusps with normal mobility.  Pulmonic Valve: The pulmonic  valve was normal in structure, with normal. No evidence of pumonic stenosis. Pulmonic valve regurgitation is mild by color flow Doppler.   Aorta: There is evidence of a dissection in the none.  +------------------+------------++ AORTIC VALVE                   +------------------+------------++ AV Vmax:          232.00 cm/s  +------------------+------------++ AV Vmean:         149.000 cm/s +------------------+------------++ AV VTI:           0.606 m      +------------------+------------++ AV Peak Grad:     21.5 mmHg    +------------------+------------++ AV Mean Grad:     10.0 mmHg     +------------------+------------++ LVOT Vmax:        90.10 cm/s   +------------------+------------++ LVOT Vmean:       58.050 cm/s  +------------------+------------++ LVOT VTI:         0.242 m      +------------------+------------++ LVOT/AV VTI ratio:0.40         +------------------+------------++  +--------------+-------++ AORTA                 +--------------+-------++ Ao Sinus diam:2.40 cm +--------------+-------++ Ao STJ diam:  2.0 cm  +--------------+-------++ Ao Asc diam:  2.90 cm +--------------+-------++  +-------------+---------++ MITRAL VALVE           +-------------+------+ +-------------+---------++ SHUNTS              MV Peak grad:2.1 mmHg  +-------------+------+ +-------------+---------++ Systemic VTI:0.24 m MV Mean grad:1.0 mmHg  +-------------+------+ +-------------+---------++ MV Vmax:     0.72 m/s  +-------------+---------++ MV Vmean:    35.2 cm/s +-------------+---------++ MV VTI:      0.26 m    +-------------+---------++   Val Eagle MD Electronically signed by Val Eagle MD Signature Date/Time: 01/10/2022/8:55:07 PM    Final    CT SCANS  CT CORONARY MORPH W/CTA COR W/SCORE 11/20/2021  Addendum 11/20/2021  4:12 PM ADDENDUM REPORT: 11/20/2021 16:10  EXAM: OVER-READ INTERPRETATION CARDIAC CT CHEST  The following report is an over-read performed by radiologist Dr. Gaylyn Rong of [facility] on 11/20/2021. This over-read does not include interpretation of cardiac or coronary anatomy or pathology. The coronary CTA interpretation by the cardiologist is attached.  COMPARISON:  Chest CT 12/29/2020  FINDINGS: Extracardiac Vascular: Substantial atheromatous wall calcification in the descending thoracic aorta.  Mediastinum: Calcified subcarinal infrahilar, and lower paraesophageal lymph nodes. Largest subcarinal lymph node 1.2 cm in short axis on  image 1 series 10, formerly the same.  Lung: Continued honeycombing posteromedially in the right lower lobe and in the left lower lobe. Continued volume loss and bronchiectasis posteriorly in the right middle lobe. Faint scattered nodularity in the lower lobes similar to previous and likely a manifestation of sarcoidosis.  Included Upper Abdomen: Unremarkable  Musculoskeletal: Thoracic spondylosis. Suspected vertebral hemangiomas in the lower thoracic spine, unchanged.  IMPRESSION: 1. Stable CT manifestations of sarcoidosis in the chest. 2.  Aortic Atherosclerosis (ICD10-I70.0).   Electronically Signed By: Gaylyn Rong M.D. On: 11/20/2021 16:10  Narrative HISTORY: Dyspnea on exertion (DOE) coronary artery calcifications  EXAM: Cardiac/Coronary CT  TECHNIQUE: The patient was scanned on a [employer].  PROTOCOL: A 120 kV prospective scan was triggered in the descending thoracic aorta at 111 HU's. Axial non-contrast 3 mm slices were carried out through the heart. The data set was analyzed on a dedicated work station and scored using the Agatston method.  Gantry rotation speed was 250 msecs and collimation was .6 mm. Heart rate was optimized medically and sl NTG was given. The 3D data set was reconstructed in 5% intervals of the 35-75 % of the R-R cycle. Systolic and diastolic phases were analyzed on a dedicated work station using MPR, MIP and VRT modes. The patient received OMNIPAQUE IOHEXOL 350 MG/ML SOLN of contrast.  FINDINGS: Coronary calcium score: The patient's coronary artery calcium score is 1527, which places the patient in the 96th percentile.  Coronary arteries: Normal coronary origins.  Right dominance.  Right Coronary Artery: Normal caliber vessel, gives rise to PDA. Diffuse calcified and noncalcified plaque. There is a stenosis in the proximal to mid vessel with 50-69% stenosis, and there is another area in the more distal mid  vessel concerning for 70-99% stenosis.  Left Main Coronary Artery: Normal caliber vessel. Proximal mixed calcified and noncalcified plaque with 1-24% stenosis.  Left Anterior Descending Coronary Artery: Normal caliber vessel. Diffuse mixed calcified and noncalcified plaque throughout proximal and mid LAD, with concern for mLAD 70-99% stenosis. Gives rise to small first and second diagonal branches. These are both small caliber with diffuse calcification.  Left Circumflex Artery: Normal caliber vessel. Trivial scattered plaque with maximum 1-24% stenosis. Gives rise to two OM branches.  Aorta: Normal size, 30 mm at the mid ascending aorta (level of the PA bifurcation) measured double oblique. Significant aortic atherosclerosis. No dissection seen in visualized portions of the aorta.  Aortic Valve: Scattered calcifications. Trileaflet.  Other findings:  Normal pulmonary vein drainage into the left atrium.  Normal left atrial appendage without a thrombus.  Normal size of the pulmonary artery.  Normal appearance of the pericardium.  Calcified nodule seen in left breast. See full report from [facility].  IMPRESSION: 1. Severe CAD, CADRADS = 4. CT FFR will be performed and reported separately. Lesions in the mLAD and mRCA concerning for 70-99% stenosis.  2. Coronary calcium score of 1527. This was 96th percentile for age and sex matched control.  3. Normal coronary origin with right dominance.  4. Significant aortic atherosclerosis.  5. Calcified nodule seen in left breast. See full report from [facility].  INTERPRETATION:  CAD-RADS 4: Severe stenosis. (70-99% or > 50% left main). Cardiac catheterization or CT FFR is recommended. Consider symptom-guided anti-ischemic pharmacotherapy as well as risk factor modification per guideline directed care. Invasive coronary angiography recommended with revascularization per published  guideline statements.  Electronically Signed: By: Jodelle Red M.D. On: 11/20/2021 15:32          Risk Assessment/Calculations:     HYPERTENSION CONTROL Vitals:   08/30/22 1050 08/30/22 1129  BP: (!) 172/78 (!) 152/82    The patient's blood pressure is elevated above target today. {Click here if intervention needs to be changed Refresh Note :1}  In order to address the patient's elevated BP: The blood pressure is usually elevated in clinic.  Blood pressures monitored at home have been optimal.; Follow up with general cardiology has been recommended.; Blood pressure will be monitored at home to determine if medication changes need to be made.          Physical Exam:   VS:  BP (!) 152/82   Pulse 65   Ht 5' 2.75" (1.594 m)   Wt 125 lb 12.8 oz (57.1 kg)   SpO2 98%   BMI 22.46 kg/m    Wt Readings from Last 3 Encounters:  08/30/22 125 lb 12.8 oz (57.1 kg)  08/14/22 124  lb 9.6 oz (56.5 kg)  08/01/22 127 lb 3.2 oz (57.7 kg)    GEN: Well nourished, well developed in no acute distress NECK: No JVD; No carotid bruits CARDIAC: RRR, no murmurs, rubs, gallops RESPIRATORY:  Clear to auscultation without rales, wheezing or rhonchi  ABDOMEN: Soft, non-tender, non-distended EXTREMITIES:  No edema; No deformity   ASSESSMENT AND PLAN: .    Cough - *** Rx Tessalon as needed for cough.   Syncope - Prior syncope vasovagal in nature. ILR has reached end of device battery life with no significant arrhythmias noted. Echo 12/2021 LVEF >75%, no RWMA, gr1DD, RVSF mildly reduced, trivial MR.  Carotid duplex 12/2021 with no significant stenosis. No indication for further workup at this time.  HTN - White coat hypertension superimposed on hypertension. Home BP 110s-130s/60-70s. Continue present antihypertensive regimen Amlodipine 5mg  every day, Carvedilol 12.5mg  BID.   Renal artery stenosis - s/p stenting to right renal artery. Follows with Dr. Kirke Corin.    CAD s/p CABGx4 12/2021 / HLD,  LDL goal <70 - Continue Rosuvastatin 40mg  every day, Reaptha 140mg  q14 days, aspirin 81mg , carvedilol 12.5mg  BID. 07/2022 LDL 54. Heart healthy diet and regular cardiovascular exercise encouraged.    Pulmonary sarcoidosis - Follows with pulmonology Dr. Delton Coombes        Dispo: follow up in 6 months with Dr. Duke Salvia  Signed, Alver Sorrow, NP

## 2022-08-31 ENCOUNTER — Encounter (HOSPITAL_BASED_OUTPATIENT_CLINIC_OR_DEPARTMENT_OTHER): Payer: Self-pay | Admitting: Family

## 2022-09-02 ENCOUNTER — Other Ambulatory Visit: Payer: Self-pay

## 2022-09-02 ENCOUNTER — Ambulatory Visit: Payer: Medicare Other | Attending: Nurse Practitioner | Admitting: Physical Therapy

## 2022-09-02 ENCOUNTER — Encounter: Payer: Self-pay | Admitting: Physical Therapy

## 2022-09-02 DIAGNOSIS — N8111 Cystocele, midline: Secondary | ICD-10-CM | POA: Insufficient documentation

## 2022-09-02 DIAGNOSIS — R278 Other lack of coordination: Secondary | ICD-10-CM | POA: Diagnosis present

## 2022-09-02 DIAGNOSIS — M6281 Muscle weakness (generalized): Secondary | ICD-10-CM | POA: Diagnosis present

## 2022-09-03 ENCOUNTER — Encounter (HOSPITAL_BASED_OUTPATIENT_CLINIC_OR_DEPARTMENT_OTHER): Payer: Self-pay

## 2022-09-03 DIAGNOSIS — R0989 Other specified symptoms and signs involving the circulatory and respiratory systems: Secondary | ICD-10-CM

## 2022-09-03 NOTE — Telephone Encounter (Signed)
Rx request sent to pharmacy.  

## 2022-09-04 ENCOUNTER — Other Ambulatory Visit: Payer: Self-pay | Admitting: Emergency Medicine

## 2022-09-05 ENCOUNTER — Encounter (HOSPITAL_BASED_OUTPATIENT_CLINIC_OR_DEPARTMENT_OTHER): Payer: Self-pay

## 2022-09-05 NOTE — Telephone Encounter (Signed)
Msg routed to CVTS coordinator below, appreciate their input.

## 2022-09-05 NOTE — Telephone Encounter (Signed)
Hi Kristen Suarez, I am covering [employer] inbox today and tomorrow. This patient had CABG in 12/2021 and Dr. Delia Chimes saw her in follow-up 05/2022 stating "On her own doing exercises to stretch her chest with a large pad centrally behind back and stretching chest signicantly - told her not to do this, but lying flat is fine. Can resume this at 38mo post surgery." She is writing in with questions about yoga posing (see below). I was not sure what our surgical team typically recommends with regard to yoga + sternal precautions in the first year post bypass if you could have someone from your team review and reply to the patient since she is no longer able to relay to Dr. Delia Chimes. Thank you!

## 2022-09-17 ENCOUNTER — Encounter: Payer: Self-pay | Admitting: Nurse Practitioner

## 2022-09-18 ENCOUNTER — Telehealth (INDEPENDENT_AMBULATORY_CARE_PROVIDER_SITE_OTHER): Payer: Medicare Other | Admitting: Internal Medicine

## 2022-09-18 ENCOUNTER — Encounter: Payer: Self-pay | Admitting: Internal Medicine

## 2022-09-18 DIAGNOSIS — J440 Chronic obstructive pulmonary disease with acute lower respiratory infection: Secondary | ICD-10-CM

## 2022-09-18 DIAGNOSIS — J209 Acute bronchitis, unspecified: Secondary | ICD-10-CM

## 2022-09-18 MED ORDER — AZITHROMYCIN 250 MG PO TABS
ORAL_TABLET | ORAL | 0 refills | Status: AC
Start: 2022-09-18 — End: 2022-09-23

## 2022-09-18 MED ORDER — BENZONATATE 100 MG PO CAPS
200.0000 mg | ORAL_CAPSULE | Freq: Three times a day (TID) | ORAL | 1 refills | Status: DC | PRN
Start: 2022-09-18 — End: 2022-12-31

## 2022-09-18 NOTE — Progress Notes (Signed)
Kristen Suarez [facility] [address] [place] [address] Dept: [phone] Dept Fax: [phone]  Virtual Video Visit  I connected with Kristen Suarez on 09/18/22 at  1:40 PM EDT by a video enabled telemedicine application and verified that I am speaking with the correct person using two identifiers.   Location patient: Home Location provider: Clinic Persons participating in the virtual visit: Patient; Mary Sella CMA; Salvatore Decent, FNP-C  I discussed the limitations of evaluation and management by telemedicine and the availability of in-person appointments. The patient expressed understanding and agreed to proceed.  Chief Complaint  Patient presents with   Cough    SUBJECTIVE:  HPI:  Kristen Suarez presents complaining of productive cough ongoing for the past week. Cough is productive with yellow-brown-green phlegm.  Onset: 1 week ago Associated symptoms: runny nose Denies: fever, chills, nasal congestion, sinus pressure, sore throat, otalgia, CP, worsening SHOB (hx of COPD).  Treatments tried: Mucinex DM, Tessalon perles (has run out of but did help cough).   The following portions of the patient's history were reviewed and updated as appropriate: medical history, surgical history, medications, allergies, social history, and family history.    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/1976   Past Surgical History:  Procedure Laterality Date   ABDOMINAL HYSTERECTOMY  1987   BASAL CELL CARCINOMA EXCISION  06/30/2020   nose    CARDIAC CATHETERIZATION     CLIPPING OF ATRIAL APPENDAGE N/A 01/04/2022   Procedure: CLIPPING OF ATRIAL APPENDAGE USING SIZE 40 ATRICURE ATRIAL CLIP;  Surgeon: Lyn Hollingshead, MD;  Location: [facility] OR;  Service: Open Heart Surgery;  Laterality: N/A;   CORONARY ARTERY BYPASS GRAFT N/A 01/04/2022   Procedure: CORONARY ARTERY BYPASS GRAFTING (CABG) x  USING ENDOSCOPIC GREATER SAPHENOUS VEIN HARVEST;  Surgeon: Lyn Hollingshead, MD;  Location: [facility] OR;  Service: Open Heart Surgery;  Laterality: N/A;   LEFT HEART CATH AND CORONARY ANGIOGRAPHY N/A 01/02/2022   Procedure: LEFT HEART CATH AND CORONARY ANGIOGRAPHY;  Surgeon: Lennette Bihari, MD;  [facility]: [facility];  Service: Cardiovascular;  Laterality: N/A;   LUNG BIOPSY  06/16/1976   PERIPHERAL VASCULAR INTERVENTION Right 11/29/2020   Procedure: PERIPHERAL VASCULAR INTERVENTION;  Surgeon: Iran Ouch, MD;  [facility]: [facility];  Service: Cardiovascular;  Laterality: Right;  Renal Artery   RENAL ANGIOGRAPHY N/A 11/29/2020   Procedure: RENAL ANGIOGRAPHY;  Surgeon: Iran Ouch, MD;  [facility];  Service: Cardiovascular;  Laterality: N/A;   TEE WITHOUT CARDIOVERSION N/A 01/04/2022   Procedure: TRANSESOPHAGEAL ECHOCARDIOGRAM (TEE);  Surgeon: Lyn Hollingshead, MD;  Location[facility] OR;  Service: Open Heart Surgery;  Laterality: N/A;     Current Outpatient Medications:    azithromycin (ZITHROMAX) 250 MG tablet, Take 2 tablets on day 1, then 1 tablet daily on days 2 through 5, Disp: 6 tablet, Rfl: 0   acetaminophen (TYLENOL) 500 MG tablet, Take 500 mg by mouth every  6 (six) hours as needed for moderate pain., Disp: , Rfl:    albuterol (PROVENTIL) (2.5 MG/3ML) 0.083% nebulizer solution, INHALE (2.5MG ) VIA NEBULIZER EVERY 6 HOURS AS NEEDED FOR WHEEZING/SHORTNESS OF BREATH, Disp: 150 mL, Rfl: 5   amLODipine (NORVASC) 5 MG tablet, TAKE 1 TABLET (5 MG TOTAL) BY MOUTH DAILY., Disp: 90 tablet, Rfl: 1   aspirin EC 81 MG  tablet, Take 1 tablet (81 mg total) by mouth daily. Swallow whole., Disp: 90 tablet, Rfl: 3   benzonatate (TESSALON PERLES) 100 MG capsule, Take 2 capsules (200 mg total) by mouth 3 (three) times daily as needed for cough (May take 100mg  (one tablet) or 200mg  (two tablets) as needed for cough.)., Disp: 20 capsule, Rfl: 1   BREZTRI AEROSPHERE 160-9-4.8 MCG/ACT AERO, INHALE 2 PUFFS INTO THE LUNGS IN THE MORNING AND AT BEDTIME., Disp: 32.1 each, Rfl: 2   Calcium Carb-Cholecalciferol (CALCIUM 600 + D PO), Take 1 tablet by mouth daily. (Patient not taking: Reported on 08/30/2022), Disp: , Rfl:    carvedilol (COREG) 12.5 MG tablet, Take 1 tablet (12.5 mg total) by mouth 2 (two) times daily., Disp: 180 tablet, Rfl: 3   cetirizine (ZYRTEC) 10 MG tablet, Take 10 mg by mouth daily as needed for allergies., Disp: , Rfl:    docusate sodium (COLACE) 100 MG capsule, Take 100 mg by mouth daily. , Disp: , Rfl:    Evolocumab (REPATHA SURECLICK) 140 MG/ML SOAJ, INJECT 140 MG INTO THE SKIN EVERY 14 (FOURTEEN) DAYS., Disp: 2 mL, Rfl: 6   fluconazole (DIFLUCAN) 150 MG tablet, Take 1 tablet (150 mg total) by mouth daily. Take second tab 3days apart from first tab (Patient not taking: Reported on 08/30/2022), Disp: 2 tablet, Rfl: 0   ipratropium (ATROVENT) 0.03 % nasal spray, Place 1 spray into both nostrils 2 (two) times daily., Disp: 30 mL, Rfl: 0   mometasone (NASONEX) 50 MCG/ACT nasal spray, SHAKE LIQUID AND USE 2 SPRAYS IN EACH NOSTRIL DAILY, Disp: 17 each, Rfl: 0   Multiple Vitamins-Minerals (MULTIVITAL PO), Take 1 tablet by mouth daily. , Disp: , Rfl:    oxybutynin (DITROPAN XL) 5 MG 24 hr tablet, Take 1 tablet (5 mg total) by mouth at bedtime. (Patient not taking: Reported on 08/30/2022), Disp: 14 tablet, Rfl: 0   pantoprazole (PROTONIX) 40 MG tablet, TAKE 1 TABLET BY MOUTH EVERY DAY, Disp: 30 tablet, Rfl: 3   Probiotic Product (PROBIOTIC DAILY PO), Take 1 tablet by mouth daily. , Disp: , Rfl:    rosuvastatin (CRESTOR)  40 MG tablet, TAKE 1 TABLET BY MOUTH EVERY DAY, Disp: 90 tablet, Rfl: 3   sodium chloride (OCEAN) 0.65 % SOLN nasal spray, Place 1 spray into both nostrils as needed for congestion., Disp: , Rfl:  Allergies  Allergen Reactions   Keflex [Cephalexin] Rash   Penicillins Itching   Sulfa Antibiotics Itching    Social History   Socioeconomic History   Marital status: Married    Spouse name: Not on file   Number of children: 3   Years of education: 72   Highest education level: Master's degree (e.g., MA, MS, MEng, MEd, MSW, MBA)  Occupational History   Occupation: retired  Tobacco Use   Smoking status: Never    Passive exposure: Past   Smokeless tobacco: Never  Vaping Use   Vaping Use: Never used  Substance and Sexual Activity   Alcohol use: Not Currently    Comment: once in a while/rarely   Drug use: Never  Sexual activity: Not Currently  Other Topics Concern   Not on file  Social History Narrative   Not on file   Social Determinants of Health   Financial Resource Strain: Low Risk  (08/13/2022)   Overall Financial Resource Strain (CARDIA)    Difficulty of Paying Living Expenses: Not hard at all  Food Insecurity: No Food Insecurity (08/13/2022)   Hunger Vital Sign    Worried About Running Out of Food in the Last Year: Never true    Ran Out of Food in the Last Year: Never true  Transportation Needs: No Transportation Needs (08/13/2022)   PRAPARE - Administrator, Civil Service (Medical): No    Lack of Transportation (Non-Medical): No  Physical Activity: Insufficiently Active (08/13/2022)   Exercise Vital Sign    Days of Exercise per Week: 3 days    Minutes of Exercise per Session: 40 min  Stress: Stress Concern Present (08/13/2022)   [employer] of Occupational Health - Occupational Stress Questionnaire    Feeling of Stress : To some extent  Social Connections: Unknown (08/13/2022)   Social Connection and Isolation Panel [NHANES]    Frequency of  Communication with Friends and Family: More than three times a week    Frequency of Social Gatherings with Friends and Family: Patient declined    Attends Religious Services: Patient declined    Database administrator or Organizations: No    Attends Engineer, structural: Not on file    Marital Status: Married  Catering manager Violence: Not At Risk (01/01/2022)   Humiliation, Afraid, Rape, and Kick questionnaire    Fear of Current or Ex-Partner: No    Emotionally Abused: No    Physically Abused: No    Sexually Abused: No    Family History  Problem Relation Age of Onset   Arthritis Mother    Cancer Mother    Depression Mother    Heart attack Mother    Hyperlipidemia Mother    Hypertension Mother    Alcohol abuse Father    Heart disease Father    Hypertension Father    Hyperlipidemia Father    Heart disease Sister    Stroke Sister    Hyperlipidemia Sister    Alcohol abuse Sister    Hyperlipidemia Sister    Hypertension Sister    Alcohol abuse Brother    Heart disease Brother    Hypertension Brother    Hyperlipidemia Brother    Alcohol abuse Brother    Early death Brother    Heart attack Brother    Heart disease Brother    Hyperlipidemia Brother    Heart disease Maternal Grandfather    Heart disease Paternal Grandfather    Multiple sclerosis Daughter    Hyperlipidemia Son      ROS: A complete ROS was performed with pertinent positives/negatives noted in the HPI. The remainder of the ROS are negative.    OBJECTIVE:  VITALS per patient if applicable: There were no vitals filed for this visit. There is no height or weight on file to calculate BMI.  GENERAL: Alert and oriented. Appears well and in no acute distress. HEENT: Atraumatic. Conjunctiva clear. No obvious abnormalities on inspection of external nose and ears. NECK: Normal movements of the head and neck. LUNGS: On inspection, no signs of respiratory distress. Breathing rate appears normal. No  obvious gross SOB, gasping or wheezing, and no conversational dyspnea. CV: No obvious cyanosis. MS: Moves all visible extremities without noticeable abnormality. PSYCH/NEURO: Pleasant  and cooperative. No obvious depression or anxiety. Speech and thought processing grossly intact.  ASSESSMENT AND PLAN: 1. Acute bronchitis with COPD (HCC) - azithromycin (ZITHROMAX) 250 MG tablet; Take 2 tablets on day 1, then 1 tablet daily on days 2 through 5  Dispense: 6 tablet; Refill: 0 - benzonatate (TESSALON PERLES) 100 MG capsule; Take 2 capsules (200 mg total) by mouth 3 (three) times daily as needed for cough (May take 100mg  (one tablet) or 200mg  (two tablets) as needed for cough.).  Dispense: 20 capsule; Refill: 1 - OTC Mucinex (plain) - Albuterol neb PRN    I discussed the assessment and treatment plan with the patient. The patient was provided an opportunity to ask questions and all were answered. The patient agreed with the plan and demonstrated an understanding of the instructions.   The patient was advised to call back or seek an in-person evaluation if the symptoms worsen or if the condition fails to improve as anticipated.  Return if symptoms worsen or fail to improve.  Salvatore Decent, FNP

## 2022-10-10 ENCOUNTER — Encounter (HOSPITAL_BASED_OUTPATIENT_CLINIC_OR_DEPARTMENT_OTHER): Payer: Self-pay

## 2022-10-15 ENCOUNTER — Encounter: Payer: Self-pay | Admitting: Physical Therapy

## 2022-10-15 ENCOUNTER — Encounter: Payer: Medicare Other | Attending: Nurse Practitioner | Admitting: Physical Therapy

## 2022-10-15 DIAGNOSIS — R278 Other lack of coordination: Secondary | ICD-10-CM | POA: Insufficient documentation

## 2022-10-15 DIAGNOSIS — M6281 Muscle weakness (generalized): Secondary | ICD-10-CM | POA: Diagnosis present

## 2022-10-15 NOTE — Therapy (Unsigned)
OUTPATIENT PHYSICAL THERAPY FEMALE PELVIC TREATMENT   Patient Name: Kristen Suarez MRN: [id number] DOB:08/07/1945, 77 y.o., female Today's Date: 10/16/2022  END OF SESSION:  PT End of Session - 10/15/22 1504     Visit Number 2    Date for PT Re-Evaluation 11/25/22    Authorization Type Medicare    Authorization - Visit Number 2    Authorization - Number of Visits 10    PT Start Time 1500    PT Stop Time 1545    PT Time Calculation (min) 45 min    Activity Tolerance Patient tolerated treatment well    Behavior During Therapy [facility] for tasks assessed/performed             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/1976   Past Surgical History:  Procedure Laterality Date   ABDOMINAL HYSTERECTOMY  1987   BASAL CELL CARCINOMA EXCISION  06/30/2020   nose   CARDIAC CATHETERIZATION     CLIPPING OF ATRIAL APPENDAGE N/A 01/04/2022   Procedure: CLIPPING OF ATRIAL APPENDAGE USING SIZE 40 ATRICURE ATRIAL CLIP;  Surgeon: Lyn Hollingshead, MD;  Location: [facility] OR;  Service: Open Heart Surgery;  Laterality: N/A;   CORONARY ARTERY BYPASS GRAFT N/A 01/04/2022   Procedure: CORONARY ARTERY BYPASS GRAFTING (CABG) x  USING ENDOSCOPIC GREATER SAPHENOUS VEIN HARVEST;  Surgeon: Lyn Hollingshead, MD;  Location: [facility] OR;  Service: Open Heart Surgery;  Laterality: N/A;   LEFT HEART CATH AND CORONARY ANGIOGRAPHY N/A 01/02/2022   Procedure: LEFT HEART CATH AND CORONARY ANGIOGRAPHY;  Surgeon: Lennette Bihari, MD;  [facility];  Service: Cardiovascular;  Laterality: N/A;   LUNG BIOPSY  06/16/1976    PERIPHERAL VASCULAR INTERVENTION Right 11/29/2020   Procedure: PERIPHERAL VASCULAR INTERVENTION;  Surgeon: Iran Ouch, MD;  [facility];  Service: Cardiovascular;  Laterality: Right;  Renal Artery   RENAL ANGIOGRAPHY N/A 11/29/2020   Procedure: RENAL ANGIOGRAPHY;  Surgeon: Iran Ouch, MD;  [facility];  Service: Cardiovascular;  Laterality: N/A;   TEE WITHOUT CARDIOVERSION N/A 01/04/2022   Procedure: TRANSESOPHAGEAL ECHOCARDIOGRAM (TEE);  Surgeon: Lyn Hollingshead, MD;  Location: [facility] OR;  Service: Open Heart Surgery;  Laterality: N/A;   Patient Active Problem List   Diagnosis Date Noted   Adjustment insomnia 08/19/2022   Nocturia more than twice per night 08/19/2022   Bilateral sensorineural hearing loss 06/27/2022   Hearing loss of right ear 02/04/2022   Hx of CABG 01/04/2022   Elevated coronary artery calcium score    Chronic kidney disease, stage 3b (HCC) 01/01/2022   Syncope 12/31/2021   Renal artery stenosis (HCC) 09/25/2021   Aortic valve sclerosis 09/25/2021   Snoring 04/13/2021   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   Epistaxis 03/29/2021   Coronary artery disease due to calcified coronary lesion 08/15/2020   Basal cell carcinoma 06/30/2020   History of loop recorder 12/13/2019   Allergic rhinitis 12/08/2019   Cough 11/08/2019  Post-nasal drip 11/08/2019   Multiple joint pain 04/22/2019   Bilateral leg edema 04/22/2019   Vaginal atrophy 01/05/2019   Resting tremor 09/08/2018   Sarcoidosis 05/08/2018   HTN (hypertension) 05/08/2018   Colon polyp 05/08/2018   Family hx of colon cancer 05/08/2018   Cystocele, midline 05/08/2018   Hyperlipidemia 05/08/2018   Vasovagal syncope 05/08/2018   Seborrheic keratosis 05/08/2018   Pseudophakia of both eyes 05/08/2018   GERD (gastroesophageal reflux disease) 05/08/2018   COPD (chronic obstructive pulmonary disease) (HCC) 05/08/2018   Osteopenia of multiple sites 12/31/2016    PCP:  Anne Ng, NP  REFERRING PROVIDER: Anne Ng, NP   REFERRING DIAG: N81.11 (ICD-10-CM) - Cystocele, midline   THERAPY DIAG:  Muscle weakness (generalized)  Other lack of coordination  Rationale for Evaluation and Treatment: Rehabilitation  ONSET DATE: 2/24  SUBJECTIVE:                                                                                                                                                                                           SUBJECTIVE STATEMENT: I am better. I was coughing till 2 weeks ago.    PAIN:  Are you having pain? No   PRECAUTIONS: Other: no lifting more than 25#  WEIGHT BEARING RESTRICTIONS: No  FALLS:  Has patient fallen in last 6 months? No  LIVING ENVIRONMENT: Lives with: lives with their spouse   OCCUPATION: retired  PLOF: Independent  PATIENT GOALS: strengthen pelvic floor  PERTINENT HISTORY:  Abdominal hysterectomy; Basal cell carcinoma excision; coronary artery bypass graft; Hear surgery 01/04/22  BOWEL MOVEMENT: no issues and takes a stool softner 2 times per day.   URINATION: Pain with urination: No Fully empty bladder: No, ultrasound showed considerable about of urine left Stream:  average Urgency: Yes: sometimes, will have the feeling with not much urine during urination Frequency: urinates every 1.5 to 2 hours, night voids is 2-5 voids due to the urge Leakage:  2 leakages in the past month that was small and just happened Pads: Yes: when out of the house for an extended period  INTERCOURSE: not sexually active  PREGNANCY: Vaginal deliveries 3 Tearing No  PROLAPSE: Cystocele     OBJECTIVE:   DIAGNOSTIC FINDINGS:  Ultrasound of the bladder showed significant amount of urine left in the bladder   COGNITION: Overall cognitive status: Within functional limits for tasks assessed     SENSATION: Light touch: Appears intact Proprioception: Appears intact   POSTURE: rounded  shoulders, forward head, and increased thoracic kyphosis, scar on chest from heart surgery  PELVIC ALIGNMENT:correct alignment  LUMBARAROM/PROM: Lumbar ROM limited by 25% for  all directions    LOWER EXTREMITY ROM: full hip ROM   LOWER EXTREMITY MMT: bilateral hip strength is 5/5   PALPATION:   General  difficulty with lower abdominal contraction                External Perineal Exam intact                             Internal Pelvic Floor contracts the anus more than the vagina  Patient confirms identification and approves PT to assess internal pelvic floor and treatment Yes  PELVIC MMT:   MMT eval  Vaginal 1/5  (Blank rows = not tested)        TONE: average  PROLAPSE: Anterior wall weakness just above the introitus  TODAY'S TREATMENT:   10/15/22 Manual: Soft tissue mobilization: Along the SCM, scalenes, diaphragm, alon gthe mandible, masseter muscle, pectoralis muscle, thoracic paraspinals Scar tissue mobilization: To the scar along her sternum to elongate  Myofascial release: Fascial release along the anterior chest by the scar Release along the posterior rib cage Exercises: Stretches/mobility: Breathing into the rib cage with therapist putting hands on the lower rib cage to assist with movement in supine Lay on side and breath rib cage into the rolled towel while therapist works on horizontal abduction on the upper arm and move the scapula Strengthening: Bilateral shoulder extension with yellow band in standing with engagement of core                                                                                                                                 PATIENT EDUCATION:  10/15/22 Education details: edcated patient on scar massage for her chest scar from open heart surgery, Access Code: [email or web address] Person educated: Patient Education method: Explanation, Demonstration, Tactile cues, Verbal cues, and Handouts Education comprehension: verbalized  understanding, returned demonstration, verbal cues required, tactile cues required, and needs further education  HOME EXERCISE PROGRAM: 10/15/22 Access Code: [email or web address] URL: [email or web address] Date: 10/15/2022 Prepared by: Eulis Foster  Exercises - Standing Shoulder Extension with Resistance  - 1 x daily - 7 x weekly - 2 sets - 10 reps - Supine Cervical Retraction with Towel  - 1 x daily - 7 x weekly - 1 sets - 10 reps - Seated Chin Tuck with Neck Elongation  - 1 x daily - 7 x weekly - 1 sets - 10 reps - Supine Diaphragmatic Breathing  - 1 x daily - 7 x weekly - 31 sets - 10 reps - Sidelying Diaphragmatic Breathing  - 1 x daily - 7 x weekly - 1 sets - 10 reps  ASSESSMENT:  CLINICAL IMPRESSION: Patient is a 77 y.o. female  who was seen today for physical therapy  treatment for cystocele. Patient has had an increase in pelvic floor pressure since 2/24.   Patient is working on rib cage mobility to improve diaphragmatic breathing. Patient  is working on her posture so she is not decreasing the intraabdominal pressure. She needs to strengthen her back to assist with upright posture. Patient will benefit from skilled therapy to improve her posture to put less pressure on the pelvic organs and increase pelvic floor strength to support her anterior wall weakness.   OBJECTIVE IMPAIRMENTS: decreased coordination, decreased strength, and increased fascial restrictions.   ACTIVITY LIMITATIONS: standing and continence  PARTICIPATION LIMITATIONS: community activity  PERSONAL FACTORS: Age, Time since onset of injury/illness/exacerbation, and 3+ comorbidities: Abdominal hysterectomy; Basal cell carcinoma excision; coronary artery bypass graft; Hear surgery 01/04/22  are also affecting patient's functional outcome.   REHAB POTENTIAL: Excellent  CLINICAL DECISION MAKING: Stable/uncomplicated  EVALUATION COMPLEXITY: Low   GOALS: Goals reviewed with patient? Yes  SHORT TERM GOALS:  Target date: 09/29/22  Patient independent with initial HEP for pelvic floor contraction.  Baseline: Goal status: INITIAL  2.  Patient understands how to perform scar massage to her chest to increase pliability and increase distance from the rib cage to pubic bone.  Baseline:  Goal status: INITIAL  3.  Patient understands ways to manage her prolapse.  Baseline:  Goal status: INITIAL  4.  Patient educated on correct posture to reduce pressure on the anterior wall weakness.  Baseline:  Goal status: INITIAL    LONG TERM GOALS: Target date: 11/25/22  Patient is independent with advanced HEP for pelvic floor and core strength.  Baseline:  Goal status: INITIAL  2.  Pelvic floor strength >/= 3/5 to support her anterior wall weakness.  Baseline:  Goal status: INITIAL  3.  Patient is able to wait 2-3 hours to urinate due to decreased in her urgency to urinate.  Baseline:  Goal status: INITIAL  4.  Patient wakes up 1-2 times per night instead of 5 times due to reduction of urgency.  Baseline:  Goal status: INITIAL   PLAN:  PT FREQUENCY: 1x/week  PT DURATION: 12 weeks  PLANNED INTERVENTIONS: Therapeutic exercises, Therapeutic activity, Neuromuscular re-education, Patient/Family education, Joint mobilization, Dry Needling, Spinal mobilization, Cryotherapy, Moist heat, scar mobilization, Taping, Biofeedback, and Manual therapy  PLAN FOR NEXT SESSION: go over scar massage, work on posture, manual work to pelvic floor and diaphragm; RUSI   Eulis Foster, PT 10/16/22 8:48 AM

## 2022-10-29 ENCOUNTER — Ambulatory Visit: Payer: Medicare Other | Admitting: Internal Medicine

## 2022-10-29 ENCOUNTER — Encounter: Payer: Self-pay | Admitting: Internal Medicine

## 2022-10-29 VITALS — BP 136/84 | HR 79 | Ht 62.75 in | Wt 123.4 lb

## 2022-10-29 DIAGNOSIS — I480 Paroxysmal atrial fibrillation: Secondary | ICD-10-CM | POA: Diagnosis not present

## 2022-10-29 DIAGNOSIS — I25118 Atherosclerotic heart disease of native coronary artery with other forms of angina pectoris: Secondary | ICD-10-CM | POA: Diagnosis not present

## 2022-10-29 NOTE — Progress Notes (Signed)
Patient Care Team: Nche, Bonna Gains, NP as PCP - General (Internal Medicine) Duke Salvia, MD as PCP - Electrophysiology (Cardiology) Chilton Si, MD as PCP - Cardiology (Cardiology) Francene Finders, MD as Referring Physician (Internal Medicine) Leslye Peer, MD as Consulting Physician (Pulmonary Disease)   HPI  Kristen Suarez is a 77 y.o. 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  The new main medication is carvedilol   .  Also complains of dyspnea.  Anorexia with a 20 pound weight loss (about 13 or 14%) no edema.  Recently has developed a sleep disturbance      DATE TEST EF   10/22 CT  Coronary artery calcifications  12/22 Echo  60-65 Hyperdynamic LVEF  10/23 LHC  3V CAD        Date Cr K Hgb  2/24   12.5  5/24 0.84 4.0           Records and Results Reviewed   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/1976    Past Surgical History:  Procedure Laterality Date   ABDOMINAL HYSTERECTOMY  1987   BASAL CELL CARCINOMA EXCISION  06/30/2020   nose   CARDIAC CATHETERIZATION     CLIPPING OF ATRIAL APPENDAGE N/A 01/04/2022   Procedure: CLIPPING OF ATRIAL APPENDAGE USING SIZE 40 ATRICURE ATRIAL CLIP;  Surgeon: Lyn Hollingshead, MD;  Location: [facility] OR;  Service: Open Heart Surgery;  Laterality: N/A;   CORONARY ARTERY BYPASS GRAFT N/A 01/04/2022    Procedure: CORONARY ARTERY BYPASS GRAFTING (CABG) x  USING ENDOSCOPIC GREATER SAPHENOUS VEIN HARVEST;  Surgeon: Lyn Hollingshead, MD;  Location: [facility] OR;  Service: Open Heart Surgery;  Laterality: N/A;   LEFT HEART CATH AND CORONARY ANGIOGRAPHY N/A 01/02/2022   Procedure: LEFT HEART CATH AND CORONARY ANGIOGRAPHY;  Surgeon: Lennette Bihari, MD;  [facility];  Service: Cardiovascular;  Laterality: N/A;   LUNG BIOPSY  06/16/1976   PERIPHERAL VASCULAR INTERVENTION Right 11/29/2020   Procedure: PERIPHERAL VASCULAR INTERVENTION;  Surgeon: Iran Ouch, MD;  [facility];  Service: Cardiovascular;  Laterality: Right;  Renal Artery   RENAL ANGIOGRAPHY N/A 11/29/2020   Procedure: RENAL ANGIOGRAPHY;  Surgeon: Iran Ouch, MD;  [facility];  Service: Cardiovascular;  Laterality: N/A;   TEE WITHOUT CARDIOVERSION N/A 01/04/2022   Procedure: TRANSESOPHAGEAL ECHOCARDIOGRAM (TEE);  Surgeon: Lyn Hollingshead, MD;  Location: [facility] OR;  Service: Open Heart Surgery;  Laterality: N/A;    Current Meds  Medication Sig   acetaminophen (TYLENOL) 500 MG tablet Take 500 mg by mouth every 6 (six) hours as needed for moderate pain.   albuterol (PROVENTIL) (2.5 MG/3ML) 0.083% nebulizer solution INHALE (2.5MG ) VIA NEBULIZER EVERY 6 HOURS AS NEEDED FOR  WHEEZING/SHORTNESS OF BREATH   amLODipine (NORVASC) 5 MG tablet TAKE 1 TABLET (5 MG TOTAL) BY MOUTH DAILY.   aspirin EC 81 MG tablet Take 1 tablet (81 mg total) by mouth daily. Swallow whole.   benzonatate (TESSALON PERLES) 100 MG capsule Take 2 capsules (200 mg total) by mouth 3 (three) times daily as needed for cough (May take 100mg  (one tablet) or 200mg  (two tablets) as needed for cough.).   BREZTRI AEROSPHERE 160-9-4.8 MCG/ACT AERO INHALE 2 PUFFS INTO THE LUNGS IN THE MORNING AND AT BEDTIME.   Calcium Carb-Cholecalciferol (CALCIUM 600 + D PO) Take 1 tablet by mouth daily.   cetirizine (ZYRTEC) 10 MG tablet Take 10 mg  by mouth daily as needed for allergies.   CRANBERRY PO Take by mouth.   docusate sodium (COLACE) 100 MG capsule Take 100 mg by mouth daily.    Evolocumab (REPATHA SURECLICK) 140 MG/ML SOAJ INJECT 140 MG INTO THE SKIN EVERY 14 (FOURTEEN) DAYS.   ipratropium (ATROVENT) 0.03 % nasal spray Place 1 spray into both nostrils 2 (two) times daily.   MAGNESIUM PO Take by mouth.   Multiple Vitamins-Minerals (MULTIVITAL PO) Take 1 tablet by mouth daily.    pantoprazole (PROTONIX) 40 MG tablet TAKE 1 TABLET BY MOUTH EVERY DAY   Probiotic Product (PROBIOTIC DAILY PO) Take 1 tablet by mouth daily.    rosuvastatin (CRESTOR) 40 MG tablet TAKE 1 TABLET BY MOUTH EVERY DAY   sodium chloride (OCEAN) 0.65 % SOLN nasal spray Place 1 spray into both nostrils as needed for congestion.    Allergies  Allergen Reactions   Keflex [Cephalexin] Rash   Penicillins Itching   Sulfa Antibiotics Itching      Review of Systems negative except from HPI and PMH  Physical Exam BP 136/84   Pulse 79   Ht 5' 2.75" (1.594 m)   Wt 123 lb 6.4 oz (56 kg)   SpO2 97%   BMI 22.03 kg/m  Well developed and well nourished in no acute distress HENT normal Neck supple with JVP-flat Clear Device pocket well healed; without hematoma or erythema.  There is no tethering  Regular rate and rhythm, no  gallop No  murmur Abd-soft with active BS No Clubbing cyanosis  edema Skin-warm and dry A & Oriented  Grossly normal sensory and motor function  ECG sinus at 79 Interval 17/07/36 ST T changes anterior leads new from 10/23 Not all that different from 8/23    See Paceart for details    Syncope-recurrent probably neurally mediated   Implantable loop recorder   Hypertension   Sarcoid-pulmonary by biopsy presumed quiescient  Dyspnea on exertion/Asthma     Interval syncope no clear trigger.  Reviewed the importance of becoming recumbent  Blood pressure is borderline; however, given the fatigue, we will get a discontinue  her carvedilol, she will keep track of her blood pressure and will follow-up with Dr. Duke Salvia.  I told her to let blood pressure drift as long as it is below 150 so as to avoid adding medications to the confusion as to her anorexia, fatigue and sleep disturbance.  Dyspnea is an issue.  Also struggling with postnasal drip.  He saw ENT.  Improved a little bit with prednisone.  Suggested Nettie pot.

## 2022-10-29 NOTE — Patient Instructions (Signed)
Medication Instructions:  Your physician has recommended you make the following change in your medication:  1) DECREASE carvedilol to 6.25 mg twice daily for 5 days, then 6.25 mg once daily for 5 days, then stop taking  *If you need a refill on your cardiac medications before your next appointment, please call your pharmacy*  Follow-Up: At [facility], you and your health needs are our priority.  As part of our continuing mission to provide you with exceptional heart care, we have created designated Provider Care Teams.  These Care Teams include your primary Cardiologist (physician) and Advanced Practice Providers (APPs -  Physician Assistants and Nurse Practitioners) who all work together to provide you with the care you need, when you need it.  Your next appointment:   1 year   Provider:   You may see Sherryl Manges, MD or one of the following Advanced Practice Providers on your designated Care Team:   Francis Dowse, [place] [address]" [place], [place] Sherie Don, NP Canary Brim, NP

## 2022-10-30 ENCOUNTER — Ambulatory Visit: Payer: Medicare Other | Attending: Nurse Practitioner | Admitting: Physical Therapy

## 2022-10-30 ENCOUNTER — Encounter: Payer: Self-pay | Admitting: Physical Therapy

## 2022-10-30 DIAGNOSIS — M6281 Muscle weakness (generalized): Secondary | ICD-10-CM | POA: Diagnosis present

## 2022-10-30 DIAGNOSIS — R278 Other lack of coordination: Secondary | ICD-10-CM | POA: Diagnosis present

## 2022-10-30 NOTE — Therapy (Signed)
OUTPATIENT PHYSICAL THERAPY FEMALE PELVIC TREATMENT   Patient Name: Kristen Suarez MRN: [id number] DOB:Sep 24, 1945, 77 y.o., female Today's Date: 10/30/2022  END OF SESSION:  PT End of Session - 10/30/22 1014     Visit Number 3    Date for PT Re-Evaluation 11/25/22    Authorization Type Medicare    Authorization - Visit Number 3    Authorization - Number of Visits 10    PT Start Time 1015    PT Stop Time 1055    PT Time Calculation (min) 40 min    Activity Tolerance Patient tolerated treatment well    Behavior During Therapy [facility] for tasks assessed/performed             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/1976   Past Surgical History:  Procedure Laterality Date   ABDOMINAL HYSTERECTOMY  1987   BASAL CELL CARCINOMA EXCISION  06/30/2020   nose   CARDIAC CATHETERIZATION     CLIPPING OF ATRIAL APPENDAGE N/A 01/04/2022   Procedure: CLIPPING OF ATRIAL APPENDAGE USING SIZE 40 ATRICURE ATRIAL CLIP;  Surgeon: Lyn Hollingshead, MD;  Location: [facility];  Service: Open Heart Surgery;  Laterality: N/A;   CORONARY ARTERY BYPASS GRAFT N/A 01/04/2022   Procedure: CORONARY ARTERY BYPASS GRAFTING (CABG) x  USING ENDOSCOPIC GREATER SAPHENOUS VEIN HARVEST;  Surgeon: Lyn Hollingshead, MD;  Location: [facility];  Service: Open Heart Surgery;  Laterality: N/A;   LEFT HEART CATH AND CORONARY ANGIOGRAPHY N/A 01/02/2022   Procedure: LEFT HEART CATH AND CORONARY ANGIOGRAPHY;  Surgeon: Lennette Bihari, MD;  [facility];  Service: Cardiovascular;  Laterality: N/A;   LUNG BIOPSY  06/16/1976    PERIPHERAL VASCULAR INTERVENTION Right 11/29/2020   Procedure: PERIPHERAL VASCULAR INTERVENTION;  Surgeon: Iran Ouch, MD;  [facility];  Service: Cardiovascular;  Laterality: Right;  Renal Artery   RENAL ANGIOGRAPHY N/A 11/29/2020   Procedure: RENAL ANGIOGRAPHY;  Surgeon: Iran Ouch, MD;  [facility];  Service: Cardiovascular;  Laterality: N/A;   TEE WITHOUT CARDIOVERSION N/A 01/04/2022   Procedure: TRANSESOPHAGEAL ECHOCARDIOGRAM (TEE);  Surgeon: Lyn Hollingshead, MD;  [facility] OR;  Service: Open Heart Surgery;  Laterality: N/A;   Patient Active Problem List   Diagnosis Date Noted   Adjustment insomnia 08/19/2022   Nocturia more than twice per night 08/19/2022   Bilateral sensorineural hearing loss 06/27/2022   Hearing loss of right ear 02/04/2022   Hx of CABG 01/04/2022   Elevated coronary artery calcium score    Chronic kidney disease, stage 3b (HCC) 01/01/2022   Syncope 12/31/2021   Renal artery stenosis (HCC) 09/25/2021   Aortic valve sclerosis 09/25/2021   Snoring 04/13/2021   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   Epistaxis 03/29/2021   Coronary artery disease due to calcified coronary lesion 08/15/2020   Basal cell carcinoma 06/30/2020   History of loop recorder 12/13/2019   Allergic rhinitis 12/08/2019   Cough 11/08/2019  Post-nasal drip 11/08/2019   Multiple joint pain 04/22/2019   Bilateral leg edema 04/22/2019   Vaginal atrophy 01/05/2019   Resting tremor 09/08/2018   Sarcoidosis 05/08/2018   HTN (hypertension) 05/08/2018   Colon polyp 05/08/2018   Family hx of colon cancer 05/08/2018   Cystocele, midline 05/08/2018   Hyperlipidemia 05/08/2018   Vasovagal syncope 05/08/2018   Seborrheic keratosis 05/08/2018   Pseudophakia of both eyes 05/08/2018   GERD (gastroesophageal reflux disease) 05/08/2018   COPD (chronic obstructive pulmonary disease) (HCC) 05/08/2018   Osteopenia of multiple sites 12/31/2016    PCP:  Anne Ng, NP  REFERRING PROVIDER: Anne Ng, NP   REFERRING DIAG: N81.11 (ICD-10-CM) - Cystocele, midline   THERAPY DIAG:  Muscle weakness (generalized)  Other lack of coordination  Rationale for Evaluation and Treatment: Rehabilitation  ONSET DATE: 2/24  SUBJECTIVE:                                                                                                                                                                                           SUBJECTIVE STATEMENT: I felt better after treatment. My symptoms are lighter. Patient is not leaking urine. She still has urgency.    PAIN:  Are you having pain? No   PRECAUTIONS: Other: no lifting more than 25#  WEIGHT BEARING RESTRICTIONS: No  FALLS:  Has patient fallen in last 6 months? No  LIVING ENVIRONMENT: Lives with: lives with their spouse   OCCUPATION: retired  PLOF: Independent  PATIENT GOALS: strengthen pelvic floor  PERTINENT HISTORY:  Abdominal hysterectomy; Basal cell carcinoma excision; coronary artery bypass graft; Hear surgery 01/04/22  BOWEL MOVEMENT: no issues and takes a stool softner 2 times per day.   URINATION: Pain with urination: No Fully empty bladder: No, ultrasound showed considerable about of urine left Stream:  average Urgency: Yes: sometimes, will have the feeling with not much urine during urination Frequency: urinates every 1.5 to 2 hours, night voids is 2-5 voids due to the urge Leakage:  2 leakages in the past month that was small and just happened Pads: Yes: when out of the house for an extended period  INTERCOURSE: not sexually active  PREGNANCY: Vaginal deliveries 3 Tearing No  PROLAPSE: Cystocele     OBJECTIVE:   DIAGNOSTIC FINDINGS:  Ultrasound of the bladder showed significant amount of urine left in the bladder   COGNITION: Overall cognitive status: Within functional limits for tasks assessed     SENSATION: Light touch: Appears  intact Proprioception: Appears intact   POSTURE: rounded shoulders, forward head, and increased thoracic kyphosis, scar on chest from heart surgery  PELVIC ALIGNMENT:correct alignment  LUMBARAROM/PROM: Lumbar ROM limited by 25% for all directions    LOWER EXTREMITY ROM: full hip ROM   LOWER EXTREMITY MMT: bilateral hip strength is 5/5   PALPATION:   General  difficulty with lower abdominal contraction                External Perineal Exam intact                             Internal Pelvic Floor contracts the anus more than the vagina  Patient confirms identification and approves PT to assess internal pelvic floor and treatment Yes  PELVIC MMT:   MMT eval  Vaginal 1/5  (Blank rows = not tested)        TONE: average  PROLAPSE: Anterior wall weakness just above the introitus  TODAY'S TREATMENT:   10/30/22 Manual: Soft tissue mobilization: Along the SCM, scalenes, diaphragm, along the mandible, masseter muscle, pectoralis muscle Scar tissue mobilization: To the scar along her sternum to elongate  Myofascial release: Fascial release along the anterior chest by the scar Exercises: Stretches/mobility: Strengthening: Supine ball squeeze horizontal shoulder abduction with yellow band 10 x, head press, pelvic floor contraction Supine ball squeeze shoulder diagonals without yellow band 10 x each way, head press, pelvic floor contraction Diaphragmatic breathing with therapist giving tactile cues to the rib cage to expand with therapist guiding the lower rib cage downward and not to use her scalenes    10/15/22 Manual: Soft tissue mobilization: Along the SCM, scalenes, diaphragm, alon gthe mandible, masseter muscle, pectoralis muscle, thoracic paraspinals Scar tissue mobilization: To the scar along her sternum to elongate  Myofascial release: Fascial release along the anterior chest by the scar Release along the posterior rib  cage Exercises: Stretches/mobility: Breathing into the rib cage with therapist putting hands on the lower rib cage to assist with movement in supine Lay on side and breath rib cage into the rolled towel while therapist works on horizontal abduction on the upper arm and move the scapula Strengthening: Bilateral shoulder extension with yellow band in standing with engagement of core                                                                                                                                 PATIENT EDUCATION:  10/30/22 Education details: edcated patient on scar massage for her chest scar from open heart surgery, Access Code: [email or web address] Person educated: Patient Education method: Explanation, Demonstration, Tactile cues, Verbal cues, and Handouts Education comprehension: verbalized understanding, returned demonstration, verbal cues required, tactile cues required, and needs further education  HOME EXERCISE PROGRAM: 10/30/22 Access Code: [email or web address] URL: [email or web address] Date: 10/30/2022 Prepared by: Eulis Foster  Exercises - - Supine Shoulder Horizontal Abduction with Resistance  - 1 x daily - 3 x weekly - 1 sets - 10 reps - Seated Shoulder Diagonal Pulls with Resistance  - 1 x daily - 3  x weekly - 1 sets - 10 reps  ASSESSMENT:  CLINICAL IMPRESSION: Patient is a 77 y.o. female  who was seen today for physical therapy  treatment for cystocele. Patient is not leaking urine at this time. I wake up due to urgency and not being able to sleep 50% of the time. Pelvic floor pressure is 50% better. She is more award of her posture to reduce strain on the pelvic floor. She has improved mobility of the scar.  Patient will benefit from skilled therapy to improve her posture to put less pressure on the pelvic organs and increase pelvic floor strength to support her anterior wall weakness.   OBJECTIVE IMPAIRMENTS: decreased coordination, decreased strength, and increased  fascial restrictions.   ACTIVITY LIMITATIONS: standing and continence  PARTICIPATION LIMITATIONS: community activity  PERSONAL FACTORS: Age, Time since onset of injury/illness/exacerbation, and 3+ comorbidities: Abdominal hysterectomy; Basal cell carcinoma excision; coronary artery bypass graft; Hear surgery 01/04/22  are also affecting patient's functional outcome.   REHAB POTENTIAL: Excellent  CLINICAL DECISION MAKING: Stable/uncomplicated  EVALUATION COMPLEXITY: Low   GOALS: Goals reviewed with patient? Yes  SHORT TERM GOALS: Target date: 09/29/22  Patient independent with initial HEP for pelvic floor contraction.  Baseline: Goal status: Met 10/30/22  2.  Patient understands how to perform scar massage to her chest to increase pliability and increase distance from the rib cage to pubic bone.  Baseline:  Goal status: INITIAL  3.  Patient understands ways to manage her prolapse.  Baseline:  Goal status: INITIAL  4.  Patient educated on correct posture to reduce pressure on the anterior wall weakness.  Baseline:  Goal status: Met 10/30/22    LONG TERM GOALS: Target date: 11/25/22  Patient is independent with advanced HEP for pelvic floor and core strength.  Baseline:  Goal status: INITIAL  2.  Pelvic floor strength >/= 3/5 to support her anterior wall weakness.  Baseline:  Goal status: INITIAL  3.  Patient is able to wait 2-3 hours to urinate due to decreased in her urgency to urinate.  Baseline:  Goal status: INITIAL  4.  Patient wakes up 1-2 times per night instead of 5 times due to reduction of urgency.  Baseline:  Goal status: INITIAL   PLAN:  PT FREQUENCY: 1x/week  PT DURATION: 12 weeks  PLANNED INTERVENTIONS: Therapeutic exercises, Therapeutic activity, Neuromuscular re-education, Patient/Family education, Joint mobilization, Dry Needling, Spinal mobilization, Cryotherapy, Moist heat, scar mobilization, Taping, Biofeedback, and Manual therapy  PLAN  FOR NEXT SESSION:  scar massage, work on posture, core strength with shoulder movements  Eulis Foster, PT 10/30/22 11:00 AM

## 2022-11-05 LAB — HM DIABETES EYE EXAM

## 2022-11-06 ENCOUNTER — Encounter: Payer: Self-pay | Admitting: Nurse Practitioner

## 2022-11-06 ENCOUNTER — Encounter: Payer: Self-pay | Admitting: Physical Therapy

## 2022-11-06 ENCOUNTER — Ambulatory Visit: Payer: Medicare Other | Admitting: Physical Therapy

## 2022-11-06 DIAGNOSIS — M6281 Muscle weakness (generalized): Secondary | ICD-10-CM | POA: Diagnosis not present

## 2022-11-06 DIAGNOSIS — R278 Other lack of coordination: Secondary | ICD-10-CM

## 2022-11-06 NOTE — Therapy (Signed)
OUTPATIENT PHYSICAL THERAPY FEMALE PELVIC TREATMENT   Patient Name: Kristen Suarez MRN: [id number] DOB:November 16, 1945, 77 y.o., female Today's Date: 11/06/2022  END OF SESSION:  PT End of Session - 11/06/22 1103     Visit Number 4    Date for PT Re-Evaluation 11/25/22    Authorization Type Medicare    Authorization - Visit Number 4    Authorization - Number of Visits 10    PT Start Time 1100    PT Stop Time 1140    PT Time Calculation (min) 40 min    Activity Tolerance Patient tolerated treatment well    Behavior During Therapy [facility] for tasks assessed/performed             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/1976   Past Surgical History:  Procedure Laterality Date   ABDOMINAL HYSTERECTOMY  1987   BASAL CELL CARCINOMA EXCISION  06/30/2020   nose   CARDIAC CATHETERIZATION     CLIPPING OF ATRIAL APPENDAGE N/A 01/04/2022   Procedure: CLIPPING OF ATRIAL APPENDAGE USING SIZE 40 ATRICURE ATRIAL CLIP;  Surgeon: Lyn Hollingshead, MD;  Location: [facility] OR;  Service: Open Heart Surgery;  Laterality: N/A;   CORONARY ARTERY BYPASS GRAFT N/A 01/04/2022   Procedure: CORONARY ARTERY BYPASS GRAFTING (CABG) x  USING ENDOSCOPIC GREATER SAPHENOUS VEIN HARVEST;  Surgeon: Lyn Hollingshead, MD;  Location: [facility] OR;  Service: Open Heart Surgery;  Laterality: N/A;   LEFT HEART CATH AND CORONARY ANGIOGRAPHY N/A 01/02/2022   Procedure: LEFT HEART CATH AND CORONARY ANGIOGRAPHY;  Surgeon: Lennette Bihari, MD;  Location: [facility] INVASIVE CV LAB;  Service: Cardiovascular;  Laterality: N/A;   LUNG BIOPSY  06/16/1976    PERIPHERAL VASCULAR INTERVENTION Right 11/29/2020   Procedure: PERIPHERAL VASCULAR INTERVENTION;  Surgeon: Iran Ouch, MD;  Location: [facility] INVASIVE CV LAB;  Service: Cardiovascular;  Laterality: Right;  Renal Artery   RENAL ANGIOGRAPHY N/A 11/29/2020   Procedure: RENAL ANGIOGRAPHY;  Surgeon: Iran Ouch, MD;  Location: [facility] INVASIVE CV LAB;  Service: Cardiovascular;  Laterality: N/A;   TEE WITHOUT CARDIOVERSION N/A 01/04/2022   Procedure: TRANSESOPHAGEAL ECHOCARDIOGRAM (TEE);  Surgeon: Lyn Hollingshead, MD;  Location: [facility] OR;  Service: Open Heart Surgery;  Laterality: N/A;   Patient Active Problem List   Diagnosis Date Noted   Adjustment insomnia 08/19/2022   Nocturia more than twice per night 08/19/2022   Bilateral sensorineural hearing loss 06/27/2022   Hearing loss of right ear 02/04/2022   Hx of CABG 01/04/2022   Elevated coronary artery calcium score    Chronic kidney disease, stage 3b (HCC) 01/01/2022   Syncope 12/31/2021   Renal artery stenosis (HCC) 09/25/2021   Aortic valve sclerosis 09/25/2021   Snoring 04/13/2021   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   Epistaxis 03/29/2021   Coronary artery disease due to calcified coronary lesion 08/15/2020   Basal cell carcinoma 06/30/2020   History of loop recorder 12/13/2019   Allergic rhinitis 12/08/2019   Cough 11/08/2019  Post-nasal drip 11/08/2019   Multiple joint pain 04/22/2019   Bilateral leg edema 04/22/2019   Vaginal atrophy 01/05/2019   Resting tremor 09/08/2018   Sarcoidosis 05/08/2018   HTN (hypertension) 05/08/2018   Colon polyp 05/08/2018   Family hx of colon cancer 05/08/2018   Cystocele, midline 05/08/2018   Hyperlipidemia 05/08/2018   Vasovagal syncope 05/08/2018   Seborrheic keratosis 05/08/2018   Pseudophakia of both eyes 05/08/2018   GERD (gastroesophageal reflux disease) 05/08/2018   COPD (chronic obstructive pulmonary disease) (HCC) 05/08/2018   Osteopenia of multiple sites 12/31/2016    PCP:  Anne Ng, NP  REFERRING PROVIDER: Anne Ng, NP   REFERRING DIAG: N81.11 (ICD-10-CM) - Cystocele, midline   THERAPY DIAG:  Muscle weakness (generalized)  Other lack of coordination  Rationale for Evaluation and Treatment: Rehabilitation  ONSET DATE: 2/24  SUBJECTIVE:                                                                                                                                                                                           SUBJECTIVE STATEMENT: I have been feeling a lot better in the last few days. I am off a mediation that was keeping me tired. I still have to go frequently and it is not a lot.     PAIN:  Are you having pain? No   PRECAUTIONS: Other: no lifting more than 25#  WEIGHT BEARING RESTRICTIONS: No  FALLS:  Has patient fallen in last 6 months? No  LIVING ENVIRONMENT: Lives with: lives with their spouse   OCCUPATION: retired  PLOF: Independent  PATIENT GOALS: strengthen pelvic floor  PERTINENT HISTORY:  Abdominal hysterectomy; Basal cell carcinoma excision; coronary artery bypass graft; Hear surgery 01/04/22  BOWEL MOVEMENT: no issues and takes a stool softner 2 times per day.   URINATION: Pain with urination: No Fully empty bladder: No, ultrasound showed considerable about of urine left Stream:  average Urgency: Yes: sometimes, will have the feeling with not much urine during urination Frequency: urinates every 1.5 to 2 hours, night voids is 2-3 voids due to the urge Leakage:  2 leakages in the past month that was small and just happened Pads: Yes: when out of the house for an extended period  INTERCOURSE: not sexually active  PREGNANCY: Vaginal deliveries 3 Tearing No  PROLAPSE: Cystocele     OBJECTIVE:   DIAGNOSTIC FINDINGS:  Ultrasound of the bladder showed significant amount of urine left in the bladder   COGNITION: Overall cognitive status: Within functional limits for tasks  assessed     SENSATION: Light touch: Appears intact Proprioception: Appears intact   POSTURE: rounded  shoulders, forward head, and increased thoracic kyphosis, scar on chest from heart surgery  PELVIC ALIGNMENT:correct alignment  LUMBARAROM/PROM: Lumbar ROM limited by 25% for all directions    LOWER EXTREMITY ROM: full hip ROM   LOWER EXTREMITY MMT: bilateral hip strength is 5/5   PALPATION:   General  difficulty with lower abdominal contraction                External Perineal Exam intact                             Internal Pelvic Floor contracts the anus more than the vagina  Patient confirms identification and approves PT to assess internal pelvic floor and treatment Yes  PELVIC MMT:   MMT eval  Vaginal 1/5  (Blank rows = not tested)        TONE: average  PROLAPSE: Anterior wall weakness just above the introitus  TODAY'S TREATMENT:   11/06/22 Manual: Soft tissue mobilization: Manual work to the diaphragm Scar tissue mobilization: Scar work to the scar on her anterior chest Myofascial release: Fascial release around the suprapubic area Tissue rolling of the upper abdominals and lower rib cage Exercises: Strengthening: Supine transverse abdominus contraction with ball squeeze to engage the lower abdomen Hip flexion isometric holding 3 sec 5 times each side to engage the lower abdomen    10/30/22 Manual: Soft tissue mobilization: Along the SCM, scalenes, diaphragm, along the mandible, masseter muscle, pectoralis muscle Scar tissue mobilization: To the scar along her sternum to elongate  Myofascial release: Fascial release along the anterior chest by the scar Exercises: Stretches/mobility: Strengthening: Supine ball squeeze horizontal shoulder abduction with yellow band 10 x, head press, pelvic floor contraction Supine ball squeeze shoulder diagonals without yellow band 10 x each way, head press, pelvic floor contraction Diaphragmatic breathing  with therapist giving tactile cues to the rib cage to expand with therapist guiding the lower rib cage downward and not to use her scalenes    10/15/22 Manual: Soft tissue mobilization: Along the SCM, scalenes, diaphragm, alon gthe mandible, masseter muscle, pectoralis muscle, thoracic paraspinals Scar tissue mobilization: To the scar along her sternum to elongate  Myofascial release: Fascial release along the anterior chest by the scar Release along the posterior rib cage Exercises: Stretches/mobility: Breathing into the rib cage with therapist putting hands on the lower rib cage to assist with movement in supine Lay on side and breath rib cage into the rolled towel while therapist works on horizontal abduction on the upper arm and move the scapula Strengthening: Bilateral shoulder extension with yellow band in standing with engagement of core                                                                                                                                 PATIENT EDUCATION:  11/06/22 Education details: edcated patient on scar massage for her chest scar from  open heart surgery, Access Code: [email or web address] Person educated: Patient Education method: Explanation, Demonstration, Tactile cues, Verbal cues, and Handouts Education comprehension: verbalized understanding, returned demonstration, verbal cues required, tactile cues required, and needs further education  HOME EXERCISE PROGRAM: 11/06/22 Access Code: [email or web address] URL: [email or web address] Date: 10/30/2022 Prepared by: Eulis Foster  Exercises -  Hooklying Transversus Abdominis Palpation  - 1 x daily - 3 x weekly - 1 sets - 10 reps - Hooklying Isometric Hip Flexion  - 1 x daily - 3 x weekly - 1 sets - 5 reps - 3 sec hold  ASSESSMENT:  CLINICAL IMPRESSION: Patient is a 77 y.o. female  who was seen today for physical therapy  treatment for cystocele. Patient is not leaking urine at this time. Patient goes to  the bathroom at night 2-3 times instead 2-5 times. Sometimes she is able to wait 2 hours to urinate. Patient is having less chest pain and the left breast is feeling softer. Patient is able to walk quicker with confidence. Patient bulges her lower abdomen when she coughs placing pressure on the bladder. She has improved mobility of the diaphragm and scar. Patient will benefit from skilled therapy to improve her posture to put less pressure on the pelvic organs and increase pelvic floor strength to support her anterior wall weakness.   OBJECTIVE IMPAIRMENTS: decreased coordination, decreased strength, and increased fascial restrictions.   ACTIVITY LIMITATIONS: standing and continence  PARTICIPATION LIMITATIONS: community activity  PERSONAL FACTORS: Age, Time since onset of injury/illness/exacerbation, and 3+ comorbidities: Abdominal hysterectomy; Basal cell carcinoma excision; coronary artery bypass graft; Hear surgery 01/04/22  are also affecting patient's functional outcome.   REHAB POTENTIAL: Excellent  CLINICAL DECISION MAKING: Stable/uncomplicated  EVALUATION COMPLEXITY: Low   GOALS: Goals reviewed with patient? Yes  SHORT TERM GOALS: Target date: 09/29/22  Patient independent with initial HEP for pelvic floor contraction.  Baseline: Goal status: Met 10/30/22  2.  Patient understands how to perform scar massage to her chest to increase pliability and increase distance from the rib cage to pubic bone.  Baseline:  Goal status: Met 11/06/22  3.  Patient understands ways to manage her prolapse.  Baseline:  Goal status: Met 11/06/22  4.  Patient educated on correct posture to reduce pressure on the anterior wall weakness.  Baseline:  Goal status: Met 10/30/22    LONG TERM GOALS: Target date: 11/25/22  Patient is independent with advanced HEP for pelvic floor and core strength.  Baseline:  Goal status: INITIAL  2.  Pelvic floor strength >/= 3/5 to support her anterior wall  weakness.  Baseline:  Goal status: INITIAL  3.  Patient is able to wait 2-3 hours to urinate due to decreased in her urgency to urinate.  Baseline:  Goal status: INITIAL  4.  Patient wakes up 1-2 times per night instead of 5 times due to reduction of urgency.  Baseline:  Goal status: INITIAL   PLAN:  PT FREQUENCY: 1x/week  PT DURATION: 12 weeks  PLANNED INTERVENTIONS: Therapeutic exercises, Therapeutic activity, Neuromuscular re-education, Patient/Family education, Joint mobilization, Dry Needling, Spinal mobilization, Cryotherapy, Moist heat, scar mobilization, Taping, Biofeedback, and Manual therapy  PLAN FOR NEXT SESSION:  scar massage, work on posture, core strength with shoulder movements  Eulis Foster, PT 11/06/22 11:45 AM

## 2022-11-11 ENCOUNTER — Encounter (HOSPITAL_BASED_OUTPATIENT_CLINIC_OR_DEPARTMENT_OTHER): Payer: Self-pay | Admitting: Cardiovascular Disease

## 2022-11-11 MED ORDER — METOPROLOL SUCCINATE ER 25 MG PO TB24: 25.0000 mg | ORAL_TABLET | Freq: Every day | ORAL | 11 refills | Status: DC

## 2022-11-11 NOTE — Telephone Encounter (Signed)
Routing to correct triage pool as patient was last seen by Dr. Graciela Husbands.

## 2022-11-11 NOTE — Telephone Encounter (Signed)
Routing back to correct triage pool, patient responding

## 2022-11-13 ENCOUNTER — Encounter: Payer: Self-pay | Admitting: Physical Therapy

## 2022-11-13 ENCOUNTER — Ambulatory Visit: Payer: Medicare Other | Admitting: Physical Therapy

## 2022-11-13 DIAGNOSIS — M6281 Muscle weakness (generalized): Secondary | ICD-10-CM | POA: Diagnosis not present

## 2022-11-13 DIAGNOSIS — R278 Other lack of coordination: Secondary | ICD-10-CM

## 2022-11-13 NOTE — Therapy (Signed)
OUTPATIENT PHYSICAL THERAPY FEMALE PELVIC TREATMENT   Patient Name: Kristen Suarez MRN: [id number] DOB:[birth year], 77 y.o., female Today's Date: 11/13/2022  END OF SESSION:  PT End of Session - 11/13/22 1231     Visit Number 5    Date for PT Re-Evaluation 01/20/23    Authorization Type Medicare    Authorization - Visit Number 5    Authorization - Number of Visits 10    PT Start Time 1230    PT Stop Time 1310    PT Time Calculation (min) 40 min    Activity Tolerance Patient tolerated treatment well    Behavior During Therapy [facility] for tasks assessed/performed             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/1976   Past Surgical History:  Procedure Laterality Date   ABDOMINAL HYSTERECTOMY  1987   BASAL CELL CARCINOMA EXCISION  06/30/2020   nose   CARDIAC CATHETERIZATION     CLIPPING OF ATRIAL APPENDAGE N/A 01/04/2022   Procedure: CLIPPING OF ATRIAL APPENDAGE USING SIZE 40 ATRICURE ATRIAL CLIP;  Surgeon: Lyn Hollingshead, MD;  Location: [facility] OR;  Service: Open Heart Surgery;  Laterality: N/A;   CORONARY ARTERY BYPASS GRAFT N/A 01/04/2022   Procedure: CORONARY ARTERY BYPASS GRAFTING (CABG) x  USING ENDOSCOPIC GREATER SAPHENOUS VEIN HARVEST;  Surgeon: Lyn Hollingshead, MD;  Location: [facility] OR;  Service: Open Heart Surgery;  Laterality: N/A;   LEFT HEART CATH AND CORONARY ANGIOGRAPHY N/A 01/02/2022   Procedure: LEFT HEART CATH AND CORONARY ANGIOGRAPHY;  Surgeon: Lennette Bihari, MD;  Location: [facility] INVASIVE CV LAB;  Service: Cardiovascular;  Laterality: N/A;   LUNG BIOPSY  06/16/1976    PERIPHERAL VASCULAR INTERVENTION Right 11/29/2020   Procedure: PERIPHERAL VASCULAR INTERVENTION;  Surgeon: Iran Ouch, MD;  Location: [facility] INVASIVE CV LAB;  Service: Cardiovascular;  Laterality: Right;  Renal Artery   RENAL ANGIOGRAPHY N/A 11/29/2020   Procedure: RENAL ANGIOGRAPHY;  Surgeon: Iran Ouch, MD;  Location: [facility] INVASIVE CV LAB;  Service: Cardiovascular;  Laterality: N/A;   TEE WITHOUT CARDIOVERSION N/A 01/04/2022   Procedure: TRANSESOPHAGEAL ECHOCARDIOGRAM (TEE);  Surgeon: Lyn Hollingshead, MD;  Location: [facility];  Service: Open Heart Surgery;  Laterality: N/A;   Patient Active Problem List   Diagnosis Date Noted   Adjustment insomnia 08/19/2022   Nocturia more than twice per night 08/19/2022   Bilateral sensorineural hearing loss 06/27/2022   Hearing loss of right ear 02/04/2022   Hx of CABG 01/04/2022   Elevated coronary artery calcium score    Chronic kidney disease, stage 3b (HCC) 01/01/2022   Syncope 12/31/2021   Renal artery stenosis (HCC) 09/25/2021   Aortic valve sclerosis 09/25/2021   Snoring 04/13/2021   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   Epistaxis 03/29/2021   Coronary artery disease due to calcified coronary lesion 08/15/2020   Basal cell carcinoma 06/30/2020   History of loop recorder 12/13/2019   Allergic rhinitis 12/08/2019   Cough 11/08/2019  Post-nasal drip 11/08/2019   Multiple joint pain 04/22/2019   Bilateral leg edema 04/22/2019   Vaginal atrophy 01/05/2019   Resting tremor 09/08/2018   Sarcoidosis 05/08/2018   HTN (hypertension) 05/08/2018   Colon polyp 05/08/2018   Family hx of colon cancer 05/08/2018   Cystocele, midline 05/08/2018   Hyperlipidemia 05/08/2018   Vasovagal syncope 05/08/2018   Seborrheic keratosis 05/08/2018   Pseudophakia of both eyes 05/08/2018   GERD (gastroesophageal reflux disease) 05/08/2018   COPD (chronic obstructive pulmonary disease) (HCC) 05/08/2018   Osteopenia of multiple sites 12/31/2016    PCP:  Anne Ng, NP  REFERRING PROVIDER: Anne Ng, NP   REFERRING DIAG: N81.11 (ICD-10-CM) - Cystocele, midline   THERAPY DIAG:  Muscle weakness (generalized) - Plan: PT plan of care cert/re-cert  Other lack of coordination - Plan: PT plan of care cert/re-cert  Rationale for Evaluation and Treatment: Rehabilitation  ONSET DATE: 2/24  SUBJECTIVE:                                                                                                                                                                                           SUBJECTIVE STATEMENT: I have increased use o fthe left arm. Almost no upper back pain. Pressure in the pelvic floor is 75% better.   PAIN:  Are you having pain? No   PRECAUTIONS: Other: no lifting more than 25#  WEIGHT BEARING RESTRICTIONS: No  FALLS:  Has patient fallen in last 6 months? No  LIVING ENVIRONMENT: Lives with: lives with their spouse   OCCUPATION: retired  PLOF: Independent  PATIENT GOALS: strengthen pelvic floor  PERTINENT HISTORY:  Abdominal hysterectomy; Basal cell carcinoma excision; coronary artery bypass graft; Hear surgery 01/04/22  BOWEL MOVEMENT: no issues and takes a stool softner 2 times per day.   URINATION: Pain with urination: No Fully empty bladder: No, ultrasound showed considerable about of urine left Stream:  average Urgency: Yes: sometimes, will have the feeling with not much urine during urination Frequency: urinates every 1.5 to 2 hours, night voids is 2-3 voids due to the urge Leakage:  2 leakages in the past month that was small and just happened Pads: Yes: when out of the house for an extended period  INTERCOURSE: not sexually active  PREGNANCY: Vaginal deliveries 3 Tearing No  PROLAPSE: Cystocele     OBJECTIVE:   DIAGNOSTIC FINDINGS:  Ultrasound of the bladder showed significant amount of urine left in the bladder   COGNITION: Overall cognitive status: Within  functional limits for tasks assessed     SENSATION: Light touch: Appears intact Proprioception: Appears intact   POSTURE: rounded shoulders,  forward head, and increased thoracic kyphosis, scar on chest from heart surgery  PELVIC ALIGNMENT:correct alignment  LUMBARAROM/PROM: Lumbar ROM limited by 25% for all directions    LOWER EXTREMITY ROM: full hip ROM   LOWER EXTREMITY MMT: bilateral hip strength is 5/5   PALPATION:   General  difficulty with lower abdominal contraction                External Perineal Exam intact                             Internal Pelvic Floor contracts the anus more than the vagina  Patient confirms identification and approves PT to assess internal pelvic floor and treatment Yes  PELVIC MMT:   MMT eval 11/13/22  Vaginal 1/5 3/5 5 sec 3 times and weak hug of therapist finger  (Blank rows = not tested)        TONE: average  PROLAPSE: Anterior wall weakness just above the introitus  TODAY'S TREATMENT:   11/13/22 Manual: Internal pelvic floor techniques: No emotional/communication barriers or cognitive limitation. Patient is motivated to learn. Patient understands and agrees with treatment goals and plan. PT explains patient will be examined in standing, sitting, and lying down to see how their muscles and joints work. When they are ready, they will be asked to remove their underwear so PT can examine their perineum. The patient is also given the option of providing their own chaperone as one is not provided in our facility. The patient also has the right and is explained the right to defer or refuse any part of the evaluation or treatment including the internal exam. With the patient's consent, PT will use one gloved finger to gently assess the muscles of the pelvic floor, seeing how well it contracts and relaxes and if there is muscle symmetry. After, the patient will get dressed and PT and patient will discuss exam findings and plan of care. PT and  patient discuss plan of care, schedule, attendance policy and HEP activities.  Therapist finger in the vaginal canal working on the ischiocavernosus, perineal body, and levator ani Neuromuscular re-education: Pelvic floor contraction training: Therapist finger in the vaginal canal working on contraction with lift by using tactile cues on the lower abdomen and moving the pelvic floor upward with contraction, then try to pull finger out of the canal and grip therapist finger.  Therapist finger in the vaginal canal with contraction as she coughs due to her pushing therapist finger out with cough.  Exercises: Strengthening: Supine pelvic floor contraction with shoulder horizontal abduction and diagonals using yellow band Supine hip flexion isometric with pelvic floor contraction   11/06/22 Manual: Soft tissue mobilization: Manual work to the diaphragm Scar tissue mobilization: Scar work to the scar on her anterior chest Myofascial release: Fascial release around the suprapubic area Tissue rolling of the upper abdominals and lower rib cage Exercises: Strengthening: Supine transverse abdominus contraction with ball squeeze to engage the lower abdomen Hip flexion isometric holding 3 sec 5 times each side to engage the lower abdomen    10/30/22 Manual: Soft tissue mobilization: Along the SCM, scalenes, diaphragm, along the mandible, masseter muscle, pectoralis muscle Scar tissue mobilization: To the scar along her sternum to elongate  Myofascial release: Fascial release along the anterior chest by the scar Exercises: Stretches/mobility: Strengthening: Supine ball squeeze horizontal shoulder abduction with yellow band 10 x, head press, pelvic floor contraction Supine ball squeeze shoulder diagonals without  yellow band 10 x each way, head press, pelvic floor contraction Diaphragmatic breathing with therapist giving tactile cues to the rib cage to expand with therapist guiding the lower  rib cage downward and not to use her scalenes                                                                                                                             PATIENT EDUCATION:  11/13/22 Education details: edcated patient on scar massage for her chest scar from open heart surgery, Access Code: [email or web address] Person educated: Patient Education method: Explanation, Demonstration, Tactile cues, Verbal cues, and Handouts Education comprehension: verbalized understanding, returned demonstration, verbal cues required, tactile cues required, and needs further education  HOME EXERCISE PROGRAM: 11/13/22 Access Code: [email or web address] URL: [email or web address] Date: 10/30/2022 Prepared by: Eulis Foster  Exercises - Supine Pelvic Floor Contraction  - 3 x daily - 7 x weekly - 1 sets - 5 reps - 10 sec hold  ASSESSMENT:  CLINICAL IMPRESSION: Patient is a 77 y.o. female  who was seen today for physical therapy  treatment for cystocele. Patient is not leaking urine at this time. Patient goes to the bathroom at night 2-3 times instead 2-5 times. Sometimes she is able to wait 2 hours to urinate. Patient is having less chest pain and the left breast is feeling softer. Patient is able to walk quicker with confidence. Patient reports her pelvic pressure decreased by 75%.  Patient will push therapist finger out when she coughs and therapist can feel the prolapse. Pelvic floor strength is 3/5 with weak hug and hold for 3 sec. Patient will benefit from skilled therapy to improve her posture to put less pressure on the pelvic organs and increase pelvic floor strength to support her anterior wall weakness.   OBJECTIVE IMPAIRMENTS: decreased coordination, decreased strength, and increased fascial restrictions.   ACTIVITY LIMITATIONS: standing and continence  PARTICIPATION LIMITATIONS: community activity  PERSONAL FACTORS: Age, Time since onset of injury/illness/exacerbation, and 3+ comorbidities:  Abdominal hysterectomy; Basal cell carcinoma excision; coronary artery bypass graft; Hear surgery 01/04/22  are also affecting patient's functional outcome.   REHAB POTENTIAL: Excellent  CLINICAL DECISION MAKING: Stable/uncomplicated  EVALUATION COMPLEXITY: Low   GOALS: Goals reviewed with patient? Yes  SHORT TERM GOALS: Target date: 09/29/22  Patient independent with initial HEP for pelvic floor contraction.  Baseline: Goal status: Met 10/30/22  2.  Patient understands how to perform scar massage to her chest to increase pliability and increase distance from the rib cage to pubic bone.  Baseline:  Goal status: Met 11/06/22  3.  Patient understands ways to manage her prolapse.  Baseline:  Goal status: Met 11/06/22  4.  Patient educated on correct posture to reduce pressure on the anterior wall weakness.  Baseline:  Goal status: Met 10/30/22    LONG TERM GOALS: Target date: 01/20/23  Patient is independent with advanced HEP for pelvic floor and core strength.  Baseline:  Goal status: IN PROGRESS  11/13/22  2.  Pelvic floor strength >/= 3/5 to support her anterior wall weakness.  Baseline:  Goal status: IN PROGRESS 11/13/22  3.  Patient is able to wait 2-3 hours to urinate due to decreased in her urgency to urinate.  Baseline:  Goal status: MET 11/13/22  4.  Patient wakes up 1-2 times per night instead of 5 times due to reduction of urgency.  Baseline: 2-3 depending on the night Goal status: IN PROGRESS 11/13/22   PLAN:  PT FREQUENCY: 1x/week  PT DURATION: 12 weeks  PLANNED INTERVENTIONS: Therapeutic exercises, Therapeutic activity, Neuromuscular re-education, Patient/Family education, Joint mobilization, Dry Needling, Spinal mobilization, Cryotherapy, Moist heat, scar mobilization, Taping, Biofeedback, and Manual therapy  PLAN FOR NEXT SESSION:  scar massage, work on posture, core strength with shoulder movements  Eulis Foster, PT 11/13/22 1:19 PM

## 2022-11-20 ENCOUNTER — Encounter: Payer: Medicare Other | Admitting: Physical Therapy

## 2022-11-27 ENCOUNTER — Encounter: Payer: Self-pay | Admitting: Physical Therapy

## 2022-11-27 ENCOUNTER — Ambulatory Visit: Payer: Medicare Other | Attending: Nurse Practitioner | Admitting: Physical Therapy

## 2022-11-27 DIAGNOSIS — M62830 Muscle spasm of back: Secondary | ICD-10-CM | POA: Diagnosis present

## 2022-11-27 DIAGNOSIS — M6281 Muscle weakness (generalized): Secondary | ICD-10-CM | POA: Diagnosis present

## 2022-11-27 DIAGNOSIS — R278 Other lack of coordination: Secondary | ICD-10-CM | POA: Insufficient documentation

## 2022-11-27 NOTE — Patient Instructions (Signed)
Urge Incontinence  Ideal urination frequency is every 2-4 wakeful hours, which equates to 5-8 times within a 24-hour period.   Urge incontinence is leakage that occurs when the bladder muscle contracts, creating a sudden need to go before getting to the bathroom.   Going too often when your bladder isn't actually full can disrupt the body's automatic signals to store and hold urine longer, which will increase urgency/frequency.  In this case, the bladder "is running the show" and strategies can be learned to retrain this pattern.   One should be able to control the first urge to urinate, at around .  The bladder can hold up to a "grande latte," or . To help you gain control, practice the Urge Drill below when urgency strikes.  This drill will help retrain your bladder signals and allow you to store and hold urine longer.  The overall goal is to stretch out your time between voids to reach a more manageable voiding schedule.    Practice your "quick flicks" often throughout the day (each waking hour) even when you don't need feel the urge to go.  This will help strengthen your pelvic floor muscles, making them more effective in controlling leakage.  Urge Drill  When you feel an urge to go, follow these steps to regain control: Stop what you are doing and be still Take one deep breath, directing your air into your abdomen Think an affirming thought, such as "I've got this." Do 5 quick flicks of your pelvic floor As you are laying down when do 5 heel raises  Walk with control to the bathroom to void, or delay voiding  [facility] [address] [place], [place] [address] Phone # [phone] Fax [phone]

## 2022-11-27 NOTE — Therapy (Signed)
OUTPATIENT PHYSICAL THERAPY FEMALE PELVIC TREATMENT   Patient Name: Kristen Suarez MRN: [id number] DOB:May 18, 1945, 77 y.o., female Today's Date: 11/27/2022  END OF SESSION:  PT End of Session - 11/27/22 1014     Visit Number 6    Date for PT Re-Evaluation 01/20/23    Authorization Type Medicare    Authorization - Visit Number 6    Authorization - Number of Visits 10    PT Start Time 1015    PT Stop Time 1055    PT Time Calculation (min) 40 min    Activity Tolerance Patient tolerated treatment well    Behavior During Therapy [facility] for tasks assessed/performed             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/1976   Past Surgical History:  Procedure Laterality Date   ABDOMINAL HYSTERECTOMY  1987   BASAL CELL CARCINOMA EXCISION  06/30/2020   nose   CARDIAC CATHETERIZATION     CLIPPING OF ATRIAL APPENDAGE N/A 01/04/2022   Procedure: CLIPPING OF ATRIAL APPENDAGE USING SIZE 40 ATRICURE ATRIAL CLIP;  Surgeon: Lyn Hollingshead, MD;  Location: [facility] OR;  Service: Open Heart Surgery;  Laterality: N/A;   CORONARY ARTERY BYPASS GRAFT N/A 01/04/2022   Procedure: CORONARY ARTERY BYPASS GRAFTING (CABG) x  USING ENDOSCOPIC GREATER SAPHENOUS VEIN HARVEST;  Surgeon: Lyn Hollingshead, MD;  Location: [facility] OR;  Service: Open Heart Surgery;  Laterality: N/A;   LEFT HEART CATH AND CORONARY ANGIOGRAPHY N/A 01/02/2022   Procedure: LEFT HEART CATH AND CORONARY ANGIOGRAPHY;  Surgeon: Lennette Bihari, MD;  Location: [facility] INVASIVE CV LAB;  Service: Cardiovascular;  Laterality: N/A;   LUNG BIOPSY  06/16/1976    PERIPHERAL VASCULAR INTERVENTION Right 11/29/2020   Procedure: PERIPHERAL VASCULAR INTERVENTION;  Surgeon: Iran Ouch, MD;  Location: [facility] INVASIVE CV LAB;  Service: Cardiovascular;  Laterality: Right;  Renal Artery   RENAL ANGIOGRAPHY N/A 11/29/2020   Procedure: RENAL ANGIOGRAPHY;  Surgeon: Iran Ouch, MD;  Location: [facility] INVASIVE CV LAB;  Service: Cardiovascular;  Laterality: N/A;   TEE WITHOUT CARDIOVERSION N/A 01/04/2022   Procedure: TRANSESOPHAGEAL ECHOCARDIOGRAM (TEE);  Surgeon: Lyn Hollingshead, MD;  Location: [facility] OR;  Service: Open Heart Surgery;  Laterality: N/A;   Patient Active Problem List   Diagnosis Date Noted   Adjustment insomnia 08/19/2022   Nocturia more than twice per night 08/19/2022   Bilateral sensorineural hearing loss 06/27/2022   Hearing loss of right ear 02/04/2022   Hx of CABG 01/04/2022   Elevated coronary artery calcium score    Chronic kidney disease, stage 3b (HCC) 01/01/2022   Syncope 12/31/2021   Renal artery stenosis (HCC) 09/25/2021   Aortic valve sclerosis 09/25/2021   Snoring 04/13/2021   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   Epistaxis 03/29/2021   Coronary artery disease due to calcified coronary lesion 08/15/2020   Basal cell carcinoma 06/30/2020   History of loop recorder 12/13/2019   Allergic rhinitis 12/08/2019   Cough 11/08/2019  Post-nasal drip 11/08/2019   Multiple joint pain 04/22/2019   Bilateral leg edema 04/22/2019   Vaginal atrophy 01/05/2019   Resting tremor 09/08/2018   Sarcoidosis 05/08/2018   HTN (hypertension) 05/08/2018   Colon polyp 05/08/2018   Family hx of colon cancer 05/08/2018   Cystocele, midline 05/08/2018   Hyperlipidemia 05/08/2018   Vasovagal syncope 05/08/2018   Seborrheic keratosis 05/08/2018   Pseudophakia of both eyes 05/08/2018   GERD (gastroesophageal reflux disease) 05/08/2018   COPD (chronic obstructive pulmonary disease) (HCC) 05/08/2018   Osteopenia of multiple sites 12/31/2016    PCP:  Anne Ng, NP  REFERRING PROVIDER: Anne Ng, NP   REFERRING DIAG: N81.11 (ICD-10-CM) - Cystocele, midline   THERAPY DIAG:  Muscle weakness (generalized)  Other lack of coordination  Muscle spasm of back  Rationale for Evaluation and Treatment: Rehabilitation  ONSET DATE: 2/24  SUBJECTIVE:                                                                                                                                                                                           SUBJECTIVE STATEMENT: I wake up 2-3 times per night and go back to sleep easily. I am not having the pressure when sitting long period of time. No issues of leakage.   PAIN:  Are you having pain? No   PRECAUTIONS: Other: no lifting more than 25#  WEIGHT BEARING RESTRICTIONS: No  FALLS:  Has patient fallen in last 6 months? No  LIVING ENVIRONMENT: Lives with: lives with their spouse   OCCUPATION: retired  PLOF: Independent  PATIENT GOALS: strengthen pelvic floor  PERTINENT HISTORY:  Abdominal hysterectomy; Basal cell carcinoma excision; coronary artery bypass graft; Hear surgery 01/04/22  BOWEL MOVEMENT: no issues and takes a stool softner 2 times per day.   URINATION: Pain with urination: No Fully empty bladder: No, ultrasound showed considerable about of urine left Stream:  average Urgency: Yes: sometimes, will have the feeling with not much urine during urination Frequency: urinates every  2 to 3 hours, night voids is 2-3 voids due to the urge Leakage: no urinary leakage Pads: Yes: when out of the house for an extended period  INTERCOURSE: not sexually active  PREGNANCY: Vaginal deliveries 3 Tearing No  PROLAPSE: Cystocele     OBJECTIVE:   DIAGNOSTIC FINDINGS:  Ultrasound of the bladder showed significant amount of urine left in the bladder   COGNITION: Overall cognitive status: Within functional limits for tasks assessed     SENSATION: Light touch:  Appears intact Proprioception: Appears intact   POSTURE: rounded shoulders, forward head, and increased thoracic kyphosis, scar on chest from  heart surgery  PELVIC ALIGNMENT:correct alignment  LUMBARAROM/PROM: Lumbar ROM limited by 25% for all directions    LOWER EXTREMITY ROM: full hip ROM   LOWER EXTREMITY MMT: bilateral hip strength is 5/5   PALPATION:   General  difficulty with lower abdominal contraction                External Perineal Exam intact                             Internal Pelvic Floor contracts the anus more than the vagina  Patient confirms identification and approves PT to assess internal pelvic floor and treatment Yes  PELVIC MMT:   MMT eval 11/13/22  Vaginal 1/5 3/5 5 sec 3 times and weak hug of therapist finger  (Blank rows = not tested)        TONE: average  PROLAPSE: Anterior wall weakness just above the introitus  TODAY'S TREATMENT:   11/27/22 Manual: Soft tissue mobilization: Manual work to the lumbar paraspinals, gluteals to reduce restrictions Neuromuscular re-education: Down training: Educated patient on the behavioral technique to deter the urge to void at night.  Exercises: Strengthening: Discussed with patient on how to exercise at the gym without straining the pelvic floor or anterior chest wall. Educated on starting with light weights, breath on the hardest part of the exercise, not to do anything with horizontal shoulder abduction to reduce tension on the chest. Patient verbally understands  11/13/22 Manual: Internal pelvic floor techniques: No emotional/communication barriers or cognitive limitation. Patient is motivated to learn. Patient understands and agrees with treatment goals and plan. PT explains patient will be examined in standing, sitting, and lying down to see how their muscles and joints work. When they are ready, they will be asked to remove their underwear so PT can examine their perineum. The patient is also given  the option of providing their own chaperone as one is not provided in our facility. The patient also has the right and is explained the right to defer or refuse any part of the evaluation or treatment including the internal exam. With the patient's consent, PT will use one gloved finger to gently assess the muscles of the pelvic floor, seeing how well it contracts and relaxes and if there is muscle symmetry. After, the patient will get dressed and PT and patient will discuss exam findings and plan of care. PT and patient discuss plan of care, schedule, attendance policy and HEP activities.  Therapist finger in the vaginal canal working on the ischiocavernosus, perineal body, and levator ani Neuromuscular re-education: Pelvic floor contraction training: Therapist finger in the vaginal canal working on contraction with lift by using tactile cues on the lower abdomen and moving the pelvic floor upward with contraction, then try to pull finger out of the canal and grip therapist finger.  Therapist finger in the vaginal canal with contraction as she coughs due to her pushing therapist finger out with cough.  Exercises: Strengthening: Supine pelvic floor contraction with shoulder horizontal abduction and diagonals using yellow band Supine hip flexion isometric with pelvic floor contraction   11/06/22 Manual: Soft tissue mobilization: Manual work to the diaphragm Scar tissue mobilization: Scar work to the scar on her anterior chest Myofascial release: Fascial release around the suprapubic area Tissue rolling of the upper abdominals and lower rib cage Exercises: Strengthening: Supine transverse abdominus contraction with ball squeeze to engage the lower abdomen Hip flexion isometric holding 3 sec  5 times each side to engage the lower abdomen                                                                                                                              PATIENT EDUCATION:   11/13/22 Education details: edcated patient on scar massage for her chest scar from open heart surgery, Access Code: [email or web address] Person educated: Patient Education method: Explanation, Demonstration, Tactile cues, Verbal cues, and Handouts Education comprehension: verbalized understanding, returned demonstration, verbal cues required, tactile cues required, and needs further education  HOME EXERCISE PROGRAM: 11/13/22 Access Code: [email or web address] URL: [email or web address] Date: 10/30/2022 Prepared by: Eulis Foster  Exercises - Supine Pelvic Floor Contraction  - 3 x daily - 7 x weekly - 1 sets - 5 reps - 10 sec hold  ASSESSMENT:  CLINICAL IMPRESSION: Patient is a 77 y.o. female  who was seen today for physical therapy  treatment for cystocele. Patient is not feeling pressure when sitting unless it is for hours. She is not having urinary leakage and pelvic floor strength is 3/5. She understands how to progress her HEP. She is sitting and standing upright better since she has increased elongation of the anterior trunk. She has good mobility of her scar from open heart surgery so she is able to do better diaphragmatic breathing. She is able to wait 2-3 hours to urinate. She has learned how to perform urge to void behavioral technique to reduce the urge to void.   OBJECTIVE IMPAIRMENTS: decreased coordination, decreased strength, and increased fascial restrictions.   ACTIVITY LIMITATIONS: standing and continence  PARTICIPATION LIMITATIONS: community activity  PERSONAL FACTORS: Age, Time since onset of injury/illness/exacerbation, and 3+ comorbidities: Abdominal hysterectomy; Basal cell carcinoma excision; coronary artery bypass graft; Hear surgery 01/04/22  are also affecting patient's functional outcome.   REHAB POTENTIAL: Excellent  CLINICAL DECISION MAKING: Stable/uncomplicated  EVALUATION COMPLEXITY: Low   GOALS: Goals reviewed with patient? Yes  SHORT TERM GOALS: Target  date: 09/29/22  Patient independent with initial HEP for pelvic floor contraction.  Baseline: Goal status: Met 10/30/22  2.  Patient understands how to perform scar massage to her chest to increase pliability and increase distance from the rib cage to pubic bone.  Baseline:  Goal status: Met 11/06/22  3.  Patient understands ways to manage her prolapse.  Baseline:  Goal status: Met 11/06/22  4.  Patient educated on correct posture to reduce pressure on the anterior wall weakness.  Baseline:  Goal status: Met 10/30/22    LONG TERM GOALS: Target date: 01/20/23  Patient is independent with advanced HEP for pelvic floor and core strength.  Baseline:  Goal status: MET 11/27/22  2.  Pelvic floor strength >/= 3/5 to support her anterior wall weakness.  Baseline:  Goal status: MET 11/27/22  3.  Patient is able to wait 2-3 hours to urinate due to decreased in her urgency to urinate.  Baseline:  Goal status: MET 11/13/22  4.  Patient  wakes up 1-2 times per night instead of 5 times due to reduction of urgency.  Baseline: 2-3 depending on the night Goal status: MET 11/13/22   PLAN: Discharge to HEP today   Eulis Foster, PT 11/27/22 10:57 AM  PHYSICAL THERAPY DISCHARGE SUMMARY  Visits from Start of Care: 6  Current functional level related to goals / functional outcomes: See above.    Remaining deficits: See above.    Education / Equipment: HEP   Patient agrees to discharge. Patient goals were met. Patient is being discharged due to meeting the stated rehab goals. Thank you.   Eulis Foster, PT 11/27/22 10:57 AM

## 2022-12-02 ENCOUNTER — Other Ambulatory Visit (HOSPITAL_BASED_OUTPATIENT_CLINIC_OR_DEPARTMENT_OTHER): Payer: Self-pay

## 2022-12-02 MED ORDER — BOOSTRIX 5-2.5-18.5 LF-MCG/0.5 IM SUSY
0.5000 mL | PREFILLED_SYRINGE | Freq: Once | INTRAMUSCULAR | 0 refills | Status: AC
  Filled 2022-12-02: qty 0.5, 1d supply, fill #0

## 2022-12-04 ENCOUNTER — Encounter: Payer: Medicare Other | Admitting: Physical Therapy

## 2022-12-13 ENCOUNTER — Other Ambulatory Visit (HOSPITAL_BASED_OUTPATIENT_CLINIC_OR_DEPARTMENT_OTHER): Payer: Self-pay

## 2022-12-13 MED ORDER — FLUAD 0.5 ML IM SUSY
0.5000 mL | PREFILLED_SYRINGE | Freq: Once | INTRAMUSCULAR | 0 refills | Status: AC
  Filled 2022-12-13: qty 0.5, 1d supply, fill #0

## 2022-12-13 MED ORDER — COMIRNATY 30 MCG/0.3ML IM SUSY
0.3000 mL | PREFILLED_SYRINGE | Freq: Once | INTRAMUSCULAR | 0 refills | Status: AC
  Filled 2022-12-13: qty 0.3, 1d supply, fill #0

## 2022-12-16 ENCOUNTER — Encounter: Payer: Self-pay | Admitting: Nurse Practitioner

## 2022-12-16 DIAGNOSIS — E782 Mixed hyperlipidemia: Secondary | ICD-10-CM

## 2022-12-16 DIAGNOSIS — R7303 Prediabetes: Secondary | ICD-10-CM

## 2022-12-26 ENCOUNTER — Ambulatory Visit: Payer: Medicare Other | Admitting: Dietician

## 2022-12-27 ENCOUNTER — Encounter: Payer: Self-pay | Admitting: Dietician

## 2022-12-27 ENCOUNTER — Encounter: Payer: Medicare Other | Attending: Nurse Practitioner | Admitting: Dietician

## 2022-12-27 VITALS — Wt 125.6 lb

## 2022-12-27 DIAGNOSIS — N183 Chronic kidney disease, stage 3 unspecified: Secondary | ICD-10-CM | POA: Insufficient documentation

## 2022-12-27 DIAGNOSIS — Z713 Dietary counseling and surveillance: Secondary | ICD-10-CM | POA: Insufficient documentation

## 2022-12-27 DIAGNOSIS — R7303 Prediabetes: Secondary | ICD-10-CM | POA: Insufficient documentation

## 2022-12-27 NOTE — Progress Notes (Signed)
Medical Nutrition Therapy  Appointment Start time:  [phone]  Appointment End time:  0905  Primary concerns today: A1c, prediabetes, preventing further weight loss   Referral diagnosis: prediabetes Preferred learning style: no preference indicated Learning readiness: ready   NUTRITION ASSESSMENT   Anthropometrics   Wt: 125.6 lb  Clinical Medical Hx: arthritis, asthma, heart murmur, HLD, HTN, acid reflux, COPD, prediabetes Medications: reviewed Labs: 08/14/22 A1c 5.9% Notable Signs/Symptoms: none reported Food Allergies: none reported  Lifestyle & Dietary Hx  Pt present today with her husband. Pt states they have 3 kids who have been on to her about what she eats and encouraging her to eat more and get enough protein.   Pt states she has been losing 1/2 lb a week since open heart surgery a year ago, which she states is unintentional and feels it is diet related and she does not want to lose any more weight.   Pt reports she was on jardiance last summer for heart reasons. Pt states she has been worried about her blood sugar since her A1c in May.   Pt states she passed out in yoga 2 times during 2 different years (once in 2022, once in 2023), which she found out was related to her heart issues so she has been scared to start back chair yoga because of this. Pt reports she has been going to physical therapy to build back her strength.   Pt goes to [employer] 3 times per week and rides the recumbent bike or stepper for 35-50 minutes.   Pt states sleep is not good and has not been good since surgery.   Estimated daily fluid intake: 48 oz Supplements: probiotic, MVI, calcium + vitamin D, magnesium Sleep: "not good" wakes up 11pm, 1am, 3am. 5-6 hours.  Stress / self-care: moderate Current average weekly physical activity: 35 minutes 3x/wk.   24-Hr Dietary Recall First Meal: 1/3 cup fiber one cereal with fairlife milk and 1/2 c lowfat greek yogurt and fresh fruit and piece of  daves cinnamon toast with earth balance butter Snack: fruit OR protein drink Second Meal: 1pm: fish or pork or chicken with vegetable and sometimes potato Snack:5-7pm: protein drink OR yogurt OR beans and greens OR graham cracker and milk Third Meal: none Snack: none Beverages: water, sometimes hot tea, zero sugar [place].   NUTRITION DIAGNOSIS  [place]-2.2 Altered nutrition-related laboratory As related to prediabetes.  As evidenced by A1c 5.9.   NUTRITION INTERVENTION  Nutrition education (E-1) on the following topics:   Plate Method Fruits & Vegetables: Aim to fill half your plate with a variety of fruits and vegetables. They are rich in vitamins, minerals, and fiber, and can help reduce the risk of chronic diseases. Choose a colorful assortment of fruits and vegetables to ensure you get a wide range of nutrients. Grains and Starches: Make at least half of your grain choices whole grains, such as brown rice, whole wheat bread, and oats. Whole grains provide fiber, which aids in digestion and healthy cholesterol levels. Aim for whole forms of starchy vegetables such as potatoes, sweet potatoes, beans, peas, and corn, which are fiber rich and provide many vitamins and minerals.  Protein: Incorporate lean sources of protein, such as poultry, fish, beans, nuts, and seeds, into your meals. Protein is essential for building and repairing tissues, staying full, balancing blood sugar, as well as supporting immune function. Dairy: Include low-fat or fat-free dairy products like milk, yogurt, and cheese in your diet. Dairy foods are excellent  sources of calcium and vitamin D, which are crucial for bone health.  Physical Activity: Aim for 60 minutes of physical activity daily. Regular physical activity promotes overall health-including helping to reduce risk for heart disease and diabetes, promoting mental health, and helping [place] sleep better.   Prediabetes Prediabetes: Prediabetes is a condition  where blood sugar levels are higher than normal but not yet high enough to be diagnosed as type 2 diabetes. A1C, or hemoglobin A1c, is a blood test that provides an average of a person's blood sugar levels over the past two to three months. It is commonly used to diagnose and monitor diabetes. For prediabetes, an A1C level between 5.7% and 6.4% typically is used to diagnose this. Here is how the A1C levels are generally categorized: Normal:  A1C below 5.7% Prediabetes:  A1C between 5.7% and 6.4% Diabetes:  A1C of 6.5% or higher When diagnosed with prediabetes, there are several lifestyle changes you can make to manage the condition: Healthy Eating:  Follow a well-balanced diet that includes a variety of fruits, vegetables, whole grains, lean proteins, and healthy fats. Monitor portion sizes and reduce intake of sugary and processed foods. Regular Physical Activity:  Engage in regular physical activity, such as brisk walking, cycling, or other aerobic exercises, for at least 150 minutes per week. Include strength training exercises at least twice a week.  Handouts Provided Include  Plate Method  Learning Style & Readiness for Change Teaching method utilized: Visual & Auditory  Demonstrated degree of understanding via: Teach Back  Barriers to learning/adherence to lifestyle change: none  Goals Established by Pt  Continue your exercise routine.   Aim to eat within 1-2 hours of waking up and every 3-5 hours following.   When snacking, aim to include a complex carb and protein.  At meals, aim to include 1/2 plate non-starchy vegetables, 1/4 plate lean protein, and 1/4 plate complex carbs.    MONITORING & EVALUATION Dietary intake, weekly physical activity, and follow up in 3 months.  Next Steps  Patient is to call for questions.

## 2022-12-27 NOTE — Patient Instructions (Addendum)
Continue your exercise routine.   Aim to eat within 1-2 hours of waking up and every 3-5 hours following.   When snacking, aim to include a complex carb and protein.  At meals, aim to include 1/2 plate non-starchy vegetables, 1/4 plate lean protein, and 1/4 plate complex carbs.   Prediabetes: Prediabetes is a condition where blood sugar levels are higher than normal but not yet high enough to be diagnosed as type 2 diabetes. A1C, or hemoglobin A1c, is a blood test that provides an average of a person's blood sugar levels over the past two to three months. It is commonly used to diagnose and monitor diabetes. For prediabetes, an A1C level between 5.7% and 6.4% typically is used to diagnose this. Here is how the A1C levels are generally categorized: Normal:  A1C below 5.7% Prediabetes:  A1C between 5.7% and 6.4% Diabetes:  A1C of 6.5% or higher When diagnosed with prediabetes, there are several lifestyle changes you can make to manage the condition: Healthy Eating:  Follow a well-balanced diet that includes a variety of fruits, vegetables, whole grains, lean proteins, and healthy fats. Monitor portion sizes and reduce intake of sugary and processed foods. Regular Physical Activity:  Engage in regular physical activity, such as brisk walking, cycling, or other aerobic exercises, for at least 150 minutes per week. Include strength training exercises at least twice a week.

## 2022-12-28 ENCOUNTER — Other Ambulatory Visit: Payer: Self-pay | Admitting: Emergency Medicine

## 2022-12-31 ENCOUNTER — Encounter: Payer: Self-pay | Admitting: Nurse Practitioner

## 2022-12-31 ENCOUNTER — Other Ambulatory Visit (HOSPITAL_BASED_OUTPATIENT_CLINIC_OR_DEPARTMENT_OTHER): Payer: Self-pay

## 2022-12-31 ENCOUNTER — Ambulatory Visit: Payer: Medicare Other | Admitting: Nurse Practitioner

## 2022-12-31 VITALS — BP 150/80 | HR 82 | Temp 98.3°F | Ht 62.75 in | Wt 124.6 lb

## 2022-12-31 DIAGNOSIS — R748 Abnormal levels of other serum enzymes: Secondary | ICD-10-CM | POA: Diagnosis not present

## 2022-12-31 DIAGNOSIS — I10 Essential (primary) hypertension: Secondary | ICD-10-CM | POA: Diagnosis not present

## 2022-12-31 DIAGNOSIS — R053 Chronic cough: Secondary | ICD-10-CM

## 2022-12-31 DIAGNOSIS — R634 Abnormal weight loss: Secondary | ICD-10-CM

## 2022-12-31 DIAGNOSIS — I701 Atherosclerosis of renal artery: Secondary | ICD-10-CM

## 2022-12-31 DIAGNOSIS — R7301 Impaired fasting glucose: Secondary | ICD-10-CM | POA: Diagnosis not present

## 2022-12-31 HISTORY — DX: Abnormal weight loss: R63.4

## 2022-12-31 LAB — HEPATIC FUNCTION PANEL
ALT: 20 U/L (ref 0–35)
AST: 25 U/L (ref 0–37)
Albumin: 4.4 g/dL (ref 3.5–5.2)
Alkaline Phosphatase: 97 U/L (ref 39–117)
Bilirubin, Direct: 0.1 mg/dL (ref 0.0–0.3)
Total Bilirubin: 0.5 mg/dL (ref 0.2–1.2)
Total Protein: 7.4 g/dL (ref 6.0–8.3)

## 2022-12-31 LAB — HEMOGLOBIN A1C: Hgb A1c MFr Bld: 5.9 % (ref 4.6–6.5)

## 2022-12-31 NOTE — Progress Notes (Signed)
Established Patient Visit  Patient: Kristen Suarez   DOB: May 22, 1945   77 y.o. Female  MRN: [id number] Visit Date: 12/31/2022  Subjective:    Chief Complaint  Patient presents with   Follow-up   HPI HTN (hypertension) Reports elevated BP in PM (140s-150s/70s-80s) AM BP (120s-130s/60s-70s) Current use of amlodipine 5mg  in Am and metoprolol 25mg  XR in PM Reports she is compliant with low sodium diet and med doses Irbesartan, Spironolactone and hydrochlorothiazide discontinued in past due to hypotension. Currently under the care of Dr. Duke Salvia BP Readings from Last 3 Encounters:  12/31/22 (!) 150/80  10/29/22 136/84  08/30/22 (!) 152/82    Increase amlodipine to 10mg  Maintain metoprolol dose Waiting for further instructions from dr. Duke Salvia  Chronic cough Onset 2021, worse in 7months. Leads to worsening stress incontinence. No dysphagia or hoarseness No improvement with benzonatate, zyrtec, nasonex, pantoprazole, and famotidine. Under the care of pulmonology due to hx of sarcoidosis. Had 1st appointment with ENT and has upcoming f/up appointment. No previous EGD, nor laryngoscopy, nor esophageal manometry.  Consider GI and/or allergist referral if no additional recommendation from ENT.  Weight loss, unintentional 10lbs in last 11months, but stable in last 3months Had appointment with nutritionist last week: increase protein and small frequent meals suggested. Normal THYROID and albumin, hgbA1c at 5.9% No ABDOMEN pain, no nausea, no constipation of diarrhea Last colonoscopy 2017: benign polyp Wt Readings from Last 3 Encounters:  12/31/22 124 lb 9.6 oz (56.5 kg)  12/27/22 125 lb 9.6 oz (57 kg)  10/29/22 123 lb 6.4 oz (56 kg)    Continue to monitor at this time  Reviewed medical, surgical, and social history today  Medications: Outpatient Medications Prior to Visit  Medication Sig   aspirin EC 81 MG tablet Take 1 tablet (81 mg total) by mouth  daily. Swallow whole.   BREZTRI AEROSPHERE 160-9-4.8 MCG/ACT AERO INHALE 2 PUFFS INTO THE LUNGS IN THE MORNING AND AT BEDTIME.   Calcium Carb-Cholecalciferol (CALCIUM 600 + D PO) Take 1 tablet by mouth daily.   cetirizine (ZYRTEC) 10 MG tablet Take 10 mg by mouth daily as needed for allergies.   CRANBERRY PO Take by mouth.   docusate sodium (COLACE) 100 MG capsule Take 100 mg by mouth daily.    Evolocumab (REPATHA SURECLICK) 140 MG/ML SOAJ INJECT 140 MG INTO THE SKIN EVERY 14 (FOURTEEN) DAYS.   MAGNESIUM PO Take by mouth every other day.   metoprolol succinate (TOPROL XL) 25 MG 24 hr tablet Take 1 tablet (25 mg total) by mouth daily.   Multiple Vitamins-Minerals (MULTIVITAL PO) Take 1 tablet by mouth daily.    pantoprazole (PROTONIX) 40 MG tablet TAKE 1 TABLET BY MOUTH EVERY DAY   Probiotic Product (PROBIOTIC DAILY PO) Take 1 tablet by mouth daily.    rosuvastatin (CRESTOR) 40 MG tablet TAKE 1 TABLET BY MOUTH EVERY DAY   sodium chloride (OCEAN) 0.65 % SOLN nasal spray Place 1 spray into both nostrils as needed for congestion.   [DISCONTINUED] amLODipine (NORVASC) 5 MG tablet TAKE 1 TABLET (5 MG TOTAL) BY MOUTH DAILY.   amLODipine (NORVASC) 5 MG tablet Take 1 tablet (5 mg total) by mouth 2 (two) times daily.   [DISCONTINUED] benzonatate (TESSALON PERLES) 100 MG capsule Take 2 capsules (200 mg total) by mouth 3 (three) times daily as needed for cough (May take 100mg  (one tablet) or 200mg  (two tablets) as needed for cough.). (Patient  not taking: Reported on 12/31/2022)   [DISCONTINUED] ipratropium (ATROVENT) 0.03 % nasal spray Place 1 spray into both nostrils 2 (two) times daily.   No facility-administered medications prior to visit.   Reviewed past medical and social history.   ROS per HPI above      Objective:  BP (!) 150/80 (BP Location: Left Arm, Patient Position: Sitting, Cuff Size: Small)   Pulse 82   Temp 98.3 F (36.8 C) (Temporal)   Ht 5' 2.75" (1.594 m)   Wt 124 lb 9.6 oz  (56.5 kg)   SpO2 98%   BMI 22.25 kg/m      Physical Exam Vitals and nursing note reviewed.  Cardiovascular:     Rate and Rhythm: Normal rate and regular rhythm.     Pulses: Normal pulses.     Heart sounds: Normal heart sounds.  Pulmonary:     Effort: Pulmonary effort is normal. No respiratory distress.     Breath sounds: Wheezing present.  Musculoskeletal:     Right lower leg: No edema.     Left lower leg: No edema.  Neurological:     Mental Status: She is alert and oriented to person, place, and time.     No results found for any visits on 12/31/22.    Assessment & Plan:    Problem List Items Addressed This Visit     Chronic cough    Onset 2021, worse in 7months. Leads to worsening stress incontinence. No dysphagia or hoarseness No improvement with benzonatate, zyrtec, nasonex, pantoprazole, and famotidine. Under the care of pulmonology due to hx of sarcoidosis. Had 1st appointment with ENT and has upcoming f/up appointment. No previous EGD, nor laryngoscopy, nor esophageal manometry.  Consider GI and/or allergist referral if no additional recommendation from ENT.      HTN (hypertension) - Primary    Reports elevated BP in PM (140s-150s/70s-80s) AM BP (120s-130s/60s-70s) Current use of amlodipine 5mg  in Am and metoprolol 25mg  XR in PM Reports she is compliant with low sodium diet and med doses Irbesartan, Spironolactone and hydrochlorothiazide discontinued in past due to hypotension. Currently under the care of Dr. Duke Salvia BP Readings from Last 3 Encounters:  12/31/22 (!) 150/80  10/29/22 136/84  08/30/22 (!) 152/82    Increase amlodipine to 10mg  Maintain metoprolol dose Waiting for further instructions from dr. Duke Salvia      Relevant Medications   amLODipine (NORVASC) 5 MG tablet   Renal artery stenosis (HCC)   Relevant Medications   amLODipine (NORVASC) 5 MG tablet   Weight loss, unintentional    10lbs in last 11months, but stable in last  3months Had appointment with nutritionist last week: increase protein and small frequent meals suggested. Normal THYROID and albumin, hgbA1c at 5.9% No ABDOMEN pain, no nausea, no constipation of diarrhea Last colonoscopy 2017: benign polyp Wt Readings from Last 3 Encounters:  12/31/22 124 lb 9.6 oz (56.5 kg)  12/27/22 125 lb 9.6 oz (57 kg)  10/29/22 123 lb 6.4 oz (56 kg)    Continue to monitor at this time      Other Visit Diagnoses     Impaired fasting blood sugar       Relevant Orders   Hemoglobin A1c   Elevated alkaline phosphatase level       Relevant Orders   Hepatic function panel      Return in about 3 months (around 04/02/2023) for HTN, impaired fasting glucose, hyperlipidemia (fasting).     Alysia Penna, NP

## 2022-12-31 NOTE — Patient Instructions (Signed)
Increase amlodipine to 5mg  BID Maintain metoprolol dose Will call you with further instructions from Dr. Duke Salvia Go to lab.

## 2022-12-31 NOTE — Assessment & Plan Note (Addendum)
Onset 2021, worse in 7months. Leads to worsening stress incontinence. No dysphagia or hoarseness No improvement with benzonatate, zyrtec, nasonex, pantoprazole, and famotidine. Under the care of pulmonology due to hx of sarcoidosis. Had 1st appointment with ENT and has upcoming f/up appointment. No previous EGD, nor laryngoscopy, nor esophageal manometry.  Consider GI and/or allergist referral if no additional recommendation from ENT.

## 2022-12-31 NOTE — Assessment & Plan Note (Addendum)
10lbs in last 11months, but stable in last 3months Had appointment with nutritionist last week: increase protein and small frequent meals suggested. Normal THYROID and albumin, hgbA1c at 5.9% No ABDOMEN pain, no nausea, no constipation of diarrhea Last colonoscopy 2017: benign polyp Wt Readings from Last 3 Encounters:  12/31/22 124 lb 9.6 oz (56.5 kg)  12/27/22 125 lb 9.6 oz (57 kg)  10/29/22 123 lb 6.4 oz (56 kg)    Continue to monitor at this time

## 2022-12-31 NOTE — Assessment & Plan Note (Addendum)
Reports elevated BP in PM (140s-150s/70s-80s) AM BP (120s-130s/60s-70s) Current use of amlodipine 5mg  in Am and metoprolol 25mg  XR in PM Reports she is compliant with low sodium diet and med doses Irbesartan, Spironolactone and hydrochlorothiazide discontinued in past due to hypotension. Currently under the care of Dr. Duke Salvia BP Readings from Last 3 Encounters:  12/31/22 (!) 150/80  10/29/22 136/84  08/30/22 (!) 152/82    Increase amlodipine to 10mg  Maintain metoprolol dose Waiting for further instructions from dr. Duke Salvia

## 2023-01-02 ENCOUNTER — Ambulatory Visit: Payer: Medicare Other | Admitting: Emergency Medicine

## 2023-01-02 ENCOUNTER — Encounter: Payer: Self-pay | Admitting: Emergency Medicine

## 2023-01-02 VITALS — BP 157/84 | HR 82 | Ht 62.7 in | Wt 127.6 lb

## 2023-01-02 DIAGNOSIS — J301 Allergic rhinitis due to pollen: Secondary | ICD-10-CM

## 2023-01-02 DIAGNOSIS — J42 Unspecified chronic bronchitis: Secondary | ICD-10-CM

## 2023-01-02 LAB — TSH: TSH: 2.03 u[IU]/mL (ref 0.450–4.500)

## 2023-01-02 LAB — SPECIMEN STATUS REPORT

## 2023-01-02 MED ORDER — ALBUTEROL SULFATE (2.5 MG/3ML) 0.083% IN NEBU: 2.5000 mg | INHALATION_SOLUTION | RESPIRATORY_TRACT | 5 refills | Status: DC | PRN

## 2023-01-02 MED ORDER — BREZTRI AEROSPHERE 160-9-4.8 MCG/ACT IN AERO: 2.0000 | INHALATION_SPRAY | Freq: Two times a day (BID) | RESPIRATORY_TRACT | 2 refills | Status: DC

## 2023-01-02 MED ORDER — FLUTICASONE PROPIONATE 50 MCG/ACT NA SUSP: 2.0000 | Freq: Every day | NASAL | 2 refills | Status: DC

## 2023-01-02 NOTE — Assessment & Plan Note (Signed)
Continue your Zyrtec once daily Add fluticasone nasal spray, 2 sprays each nostril once daily

## 2023-01-02 NOTE — Patient Instructions (Addendum)
Please continue your Breztri 2 puffs twice a day.  Rinse and gargle after using. Use your albuterol up to every 4 hours if needed for shortness of breath, chest tightness, wheezing.  We will refill your albuterol nebulizer machine and solution today Keep up the good work with your exercise routine.  Continue to workout at [facility] well as you have been doing Continue your Zyrtec once daily Add fluticasone nasal spray, 2 sprays each nostril once daily Follow with APP in 6 months Follow Dr. Delton Coombes in 12 months, sooner if you have any problems.

## 2023-01-02 NOTE — Progress Notes (Signed)
Subjective:    Patient ID: Kristen Suarez, female    DOB: 28-Apr-1945, 77 y.o.   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  Also with chronic allergic rhinitis, GERD, new dx CAD, mild OSA by sleep study 05/2021.  We discussed possible CPAP but decided to defer given concern that she may not be able to tolerate.  Since I last saw her she underwent CABG 12/2021.  She has been undergoing cardiac rehab, just finishing.  She Currently managed on Breztri. She has occasional wheeze. She does have some random exertional SOB, can happen w walking 113ft, but then again she can do 30 minutes observed exercise. She has been using her albuterol, about 1-2x a week (less than 1 month ago).   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  She also deals with chronic allergic rhinitis, GERD and mild OSA.  PMH also significant for CAD for which she underwent CABG 12/2021, paroxysmal atrial fibrillation.  Her last CT chest was in October 2022, last pulmonary function testing and in January 2023. She has had significant profound and persistent fatigue since her CABG on carvedilol and this was stopped in August. She believes that her fatigue is a bit better. Her breathing is stable, about the same as 1 yr ago (before her CABG). She is working out at [employer]. She remains on [place], is not requiring her. She is using albuterol nebs every morning. She has a cough and clears mucous, clear. She is on zyrtec, protonix.     Review of Systems  Constitutional:  Negative for fever and unexpected weight change.  HENT:  Negative for congestion, dental problem, ear pain, nosebleeds, postnasal drip, rhinorrhea, sinus pressure, sneezing, sore throat and trouble swallowing.   Eyes:  Negative for redness and  itching.  Respiratory:  Positive for cough and shortness of breath. Negative for chest tightness and wheezing.   Cardiovascular:  Negative for palpitations and leg swelling.  Gastrointestinal:  Negative for nausea and vomiting.  Genitourinary:  Negative for dysuria.  Musculoskeletal:  Negative for joint swelling.  Skin:  Negative for rash.  Neurological:  Negative for headaches.  Hematological:  Does not bruise/bleed easily.  Psychiatric/Behavioral:  Negative for dysphoric mood. The patient is not nervous/anxious.       Objective:   Physical Exam Vitals:   01/02/23 1451  BP: (!) 157/84  Pulse: 82  SpO2: 97%  Weight: 127 lb 9.6 oz (57.9 kg)  Height: 5' 2.7" (1.593 m)   Gen: Pleasant, well-nourished, in no distress,  normal affect  ENT: No lesions,  mouth clear,  oropharynx clear, no postnasal drip  Neck: No JVD, no upper airway noise  Lungs: No use of accessory muscles, good excursion, crackles or wheezes  Cardiovascular: RRR, heart sounds normal, no murmur or gallops, no peripheral edema  Musculoskeletal: No deformities, no cyanosis or clubbing  Neuro: alert, awake, non focal  Skin: Warm, no lesions or rash      Assessment & Plan:  COPD (chronic obstructive pulmonary disease) (HCC) Please continue your Breztri 2 puffs twice a day.  Rinse and gargle after using. Use your albuterol up to every 4 hours if needed for shortness of breath, chest tightness, wheezing.  We will refill your albuterol nebulizer machine and solution today Keep up the good work with your exercise routine.  Continue to workout at [facility] well as you have been doing Follow with APP in 6 months Follow Dr. Delton Coombes in 12 months, sooner if you have any problems.  Allergic rhinitis Continue your Zyrtec once daily Add fluticasone nasal spray, 2 sprays each nostril once daily   Levy Pupa, MD, PhD 01/02/2023, 3:11 PM [facility] [phone] or if no answer [phone]

## 2023-01-02 NOTE — Assessment & Plan Note (Signed)
Please continue your Breztri 2 puffs twice a day.  Rinse and gargle after using. Use your albuterol up to every 4 hours if needed for shortness of breath, chest tightness, wheezing.  We will refill your albuterol nebulizer machine and solution today Keep up the good work with your exercise routine.  Continue to workout at [facility] well as you have been doing Follow with APP in 6 months Follow Dr. Delton Coombes in 12 months, sooner if you have any problems.

## 2023-01-03 ENCOUNTER — Other Ambulatory Visit (HOSPITAL_BASED_OUTPATIENT_CLINIC_OR_DEPARTMENT_OTHER): Payer: Self-pay

## 2023-01-06 NOTE — Progress Notes (Unsigned)
Cardiology Office Note   Date:  01/07/2023   ID:  Kristen Suarez, DOB 1945-11-21, MRN [id number]  PCP:  Anne Ng, NP  Cardiologist: Dr. Duke Salvia  No chief complaint on file.       History of Present Illness: Kristen Suarez is a 77 y.o. female who is here today for follow-up visit regarding renovascular hypertension.   She has known history of hypertension, coronary calcifications noted on previous CT scan, pulmonary sarcoidosis and asthma.  She was diagnosed with hypertension 20 years ago but had significant elevation in blood pressure in 2022.    She underwent renal artery duplex in June, 2022 which showed significant right renal artery stenosis with peak velocity of 291 with a bruit.  No flow was detected into the left renal artery.  Right kidney size was 10.6 cm and left kidney was 8 cm.  Renal artery angiography was performed in September 2022 which showed chronically occluded left renal artery with severe ostial stenosis in the right renal artery.  I performed successful angioplasty and stent placement to the right renal artery.  Most renal artery duplex in April of this year showed widely patent stent with no significant restenosis.  She underwent CABG in October 2023.  She has been doing well with no recent chest pain, shortness of breath or palpitations.  Her blood pressure has been well-controlled on Toprol and amlodipine.  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/1976    Past Surgical History:  Procedure Laterality Date   ABDOMINAL HYSTERECTOMY  1987    BASAL CELL CARCINOMA EXCISION  06/30/2020   nose   CARDIAC CATHETERIZATION     CLIPPING OF ATRIAL APPENDAGE N/A 01/04/2022   Procedure: CLIPPING OF ATRIAL APPENDAGE USING SIZE 40 ATRICURE ATRIAL CLIP;  Surgeon: Lyn Hollingshead, MD;  Location: [facility] OR;  Service: Open Heart Surgery;  Laterality: N/A;   CORONARY ARTERY BYPASS GRAFT N/A 01/04/2022   Procedure: CORONARY ARTERY BYPASS GRAFTING (CABG) x  USING ENDOSCOPIC GREATER SAPHENOUS VEIN HARVEST;  Surgeon: Lyn Hollingshead, MD;  Location: [facility] OR;  Service: Open Heart Surgery;  Laterality: N/A;   LEFT HEART CATH AND CORONARY ANGIOGRAPHY N/A 01/02/2022   Procedure: LEFT HEART CATH AND CORONARY ANGIOGRAPHY;  Surgeon: Lennette Bihari, MD;  Location: [facility] INVASIVE CV LAB;  Service: Cardiovascular;  Laterality: N/A;   LUNG BIOPSY  06/16/1976   PERIPHERAL VASCULAR INTERVENTION Right 11/29/2020   Procedure: PERIPHERAL VASCULAR INTERVENTION;  Surgeon: Iran Ouch, MD;  Location: [facility] INVASIVE CV LAB;  Service: Cardiovascular;  Laterality: Right;  Renal Artery   RENAL ANGIOGRAPHY N/A 11/29/2020   Procedure: RENAL ANGIOGRAPHY;  Surgeon: Iran Ouch, MD;  Location: [facility] INVASIVE CV LAB;  Service: Cardiovascular;  Laterality: N/A;   TEE WITHOUT CARDIOVERSION N/A 01/04/2022   Procedure: TRANSESOPHAGEAL ECHOCARDIOGRAM (TEE);  Surgeon: Lyn Hollingshead, MD;  Location: [facility] OR;  Service: Open Heart Surgery;  Laterality: N/A;     Current Outpatient Medications  Medication Sig Dispense  Refill   acetaminophen (TYLENOL) 80 MG chewable tablet Chew 80 mg by mouth every 6 (six) hours as needed.     albuterol (PROVENTIL) (2.5 MG/3ML) 0.083% nebulizer solution Take 3 mLs (2.5 mg total) by nebulization every 4 (four) hours as needed. 75 mL 5   amLODipine (NORVASC) 5 MG tablet Take 1 tablet (5 mg total) by mouth 2 (two) times daily.     aspirin EC 81 MG tablet Take 1 tablet (81 mg total) by mouth daily. Swallow whole. 90 tablet 3   Budeson-Glycopyrrol-Formoterol (BREZTRI  AEROSPHERE) 160-9-4.8 MCG/ACT AERO Inhale 2 puffs into the lungs in the morning and at bedtime. 32.1 each 2   Calcium Carb-Cholecalciferol (CALCIUM 600 + D PO) Take 1 tablet by mouth daily.     cetirizine (ZYRTEC) 10 MG tablet Take 10 mg by mouth daily as needed for allergies.     CRANBERRY PO Take by mouth.     docusate sodium (COLACE) 100 MG capsule Take 100 mg by mouth daily.      Evolocumab (REPATHA SURECLICK) 140 MG/ML SOAJ INJECT 140 MG INTO THE SKIN EVERY 14 (FOURTEEN) DAYS. 2 mL 6   fluticasone (FLONASE) 50 MCG/ACT nasal spray Place 2 sprays into both nostrils daily. 11.1 g 2   MAGNESIUM PO Take by mouth every other day.     metoprolol succinate (TOPROL XL) 25 MG 24 hr tablet Take 1 tablet (25 mg total) by mouth daily. 30 tablet 11   Multiple Vitamins-Minerals (MULTIVITAL PO) Take 1 tablet by mouth daily.      pantoprazole (PROTONIX) 40 MG tablet TAKE 1 TABLET BY MOUTH EVERY DAY 90 tablet 1   Probiotic Product (PROBIOTIC DAILY PO) Take 1 tablet by mouth daily.      rosuvastatin (CRESTOR) 40 MG tablet TAKE 1 TABLET BY MOUTH EVERY DAY 90 tablet 3   sodium chloride (OCEAN) 0.65 % SOLN nasal spray Place 1 spray into both nostrils as needed for congestion.     No current facility-administered medications for this visit.    Allergies:   Keflex [cephalexin], Penicillins, and Sulfa antibiotics    Social History:  The patient  reports that she has never smoked. She has been exposed to tobacco smoke. She has never used smokeless tobacco. She reports that she does not currently use alcohol. She reports that she does not use drugs.   Family History:  The patient's family history includes Alcohol abuse in her brother, brother, father, and sister; Arthritis in her mother; Cancer in her mother; Depression in her mother; Early death in her brother; Heart attack in her brother and mother; Heart disease in her brother, brother, father, maternal grandfather, paternal grandfather, and sister;  Hyperlipidemia in her brother, brother, father, mother, sister, sister, and son; Hypertension in her brother, father, mother, and sister; Multiple sclerosis in her daughter; Stroke in her sister.    ROS:  Please see the history of present illness.   Otherwise, review of systems are positive for none.   All other systems are reviewed and negative.    PHYSICAL EXAM: VS:  BP 126/78 (BP Location: Left Arm, Patient Position: Sitting, Cuff Size: Normal)   Pulse 78   Ht 5\' 2"  (1.575 m)   Wt 126 lb (57.2 kg)   SpO2 97%   BMI 23.05 kg/m  , BMI Body mass index is 23.05 kg/m. GEN: Well nourished, well developed, in no acute distress  HEENT: normal  Neck: no JVD, carotid bruits, or masses Cardiac: RRR; no rubs, or gallops,no  edema .  2 out of 6 systolic murmur in the aortic area. Respiratory:  clear to auscultation bilaterally, normal work of breathing GI: soft, nontender, nondistended, + BS MS: no deformity or atrophy  Skin: warm and dry, no rash Neuro:  Strength and sensation are intact Psych: euthymic mood, full affect    EKG:  EKG is not ordered today.    Recent Labs: 01/13/2022: Magnesium 2.1 04/30/2022: Hemoglobin 12.5; Platelets 308 07/23/2022: BUN 23; Creatinine, Ser 0.84; Potassium 4.0; Sodium 140 12/31/2022: ALT 20; TSH 2.030    Lipid Panel    Component Value Date/Time   CHOL 119 07/23/2022 0819   TRIG 103 07/23/2022 0819   HDL 46 07/23/2022 0819   CHOLHDL 2.6 07/23/2022 0819   CHOLHDL 2 05/23/2022 0929   VLDL 30.8 05/23/2022 0929   LDLCALC 54 07/23/2022 0819      Wt Readings from Last 3 Encounters:  01/07/23 126 lb (57.2 kg)  01/02/23 127 lb 9.6 oz (57.9 kg)  12/31/22 124 lb 9.6 oz (56.5 kg)          No data to display            ASSESSMENT AND PLAN:  1.  Renal artery stenosis: Status post  stenting of the right renal artery with chronically occluded left renal artery.  Her blood pressure is well-controlled on only 2 medications.  In addition, her  renal function returned to normal.  Most recent renal artery duplex showed widely patent right renal artery stent.  Repeat study in April of next year.    2.  Hyperlipidemia: She is doing well with rosuvastatin and Repatha.  Most recent lipid profile showed an LDL of 54.  3.  Coronary artery disease status post CABG: No angina at the present time.    Disposition: Follow-up in 12 months.  Signed,  Lorine Bears, MD  01/07/2023 8:40 AM    [facility]

## 2023-01-07 ENCOUNTER — Ambulatory Visit: Payer: Medicare Other | Attending: Cardiovascular Disease | Admitting: Cardiovascular Disease

## 2023-01-07 ENCOUNTER — Encounter: Payer: Self-pay | Admitting: Cardiovascular Disease

## 2023-01-07 VITALS — BP 126/78 | HR 78 | Ht 62.0 in | Wt 126.0 lb

## 2023-01-07 DIAGNOSIS — E785 Hyperlipidemia, unspecified: Secondary | ICD-10-CM | POA: Diagnosis not present

## 2023-01-07 DIAGNOSIS — I701 Atherosclerosis of renal artery: Secondary | ICD-10-CM | POA: Diagnosis present

## 2023-01-07 DIAGNOSIS — I2510 Atherosclerotic heart disease of native coronary artery without angina pectoris: Secondary | ICD-10-CM

## 2023-01-07 NOTE — Patient Instructions (Signed)
Medication Instructions:  No changes *If you need a refill on your cardiac medications before your next appointment, please call your pharmacy*   Lab Work: None ordered If you have labs (blood work) drawn today and your tests are completely normal, you will receive your results only by: MyChart Message (if you have MyChart) OR A paper copy in the mail If you have any lab test that is abnormal or we need to change your treatment, we will call you to review the results.   Testing/Procedures: Your physician has requested that you have a renal artery duplex in April 2025. During this test, an ultrasound is used to evaluate blood flow to the kidneys. Take your medications as you usually do. This will take place at [address], [address].  No food after 11PM the night before.  Water is OK. (Don't drink liquids if you have been instructed not to for ANOTHER test). Avoid foods that produce bowel gas, for 24 hours prior to exam (see below). No breakfast, no chewing gum, no smoking or carbonated beverages. Patient may take morning medications with water. Come in for test at least 15 minutes early to register.    Follow-Up: At [facility], you and your health needs are our priority.  As part of our continuing mission to provide you with exceptional heart care, we have created designated Provider Care Teams.  These Care Teams include your primary Cardiologist (physician) and Advanced Practice Providers (APPs -  Physician Assistants and Nurse Practitioners) who all work together to provide you with the care you need, when you need it.  We recommend signing up for the patient portal called "MyChart".  Sign up information is provided on this After Visit Summary.  MyChart is used to connect with patients for Virtual Visits (Telemedicine).  Patients are able to view lab/test results, encounter notes, upcoming appointments, etc.  Non-urgent messages can be sent to your provider as well.    To learn more about what you can do with MyChart, go to [email or web address].    Your next appointment:   12 month(s)  Provider:   Dr. Kirke Corin

## 2023-01-10 ENCOUNTER — Other Ambulatory Visit (HOSPITAL_BASED_OUTPATIENT_CLINIC_OR_DEPARTMENT_OTHER): Payer: Self-pay | Admitting: Cardiovascular Disease

## 2023-01-26 IMAGING — DX DG CHEST 2V
2 series · 2 of 2 positions shown · non-contrast
Comparison: 04/21/2019

CLINICAL DATA: Persistent and worsening cough, COPD exacerbation,
history hypertension, GERD, sarcoidosis

EXAM:
CHEST - 2 VIEW

[chest pa]
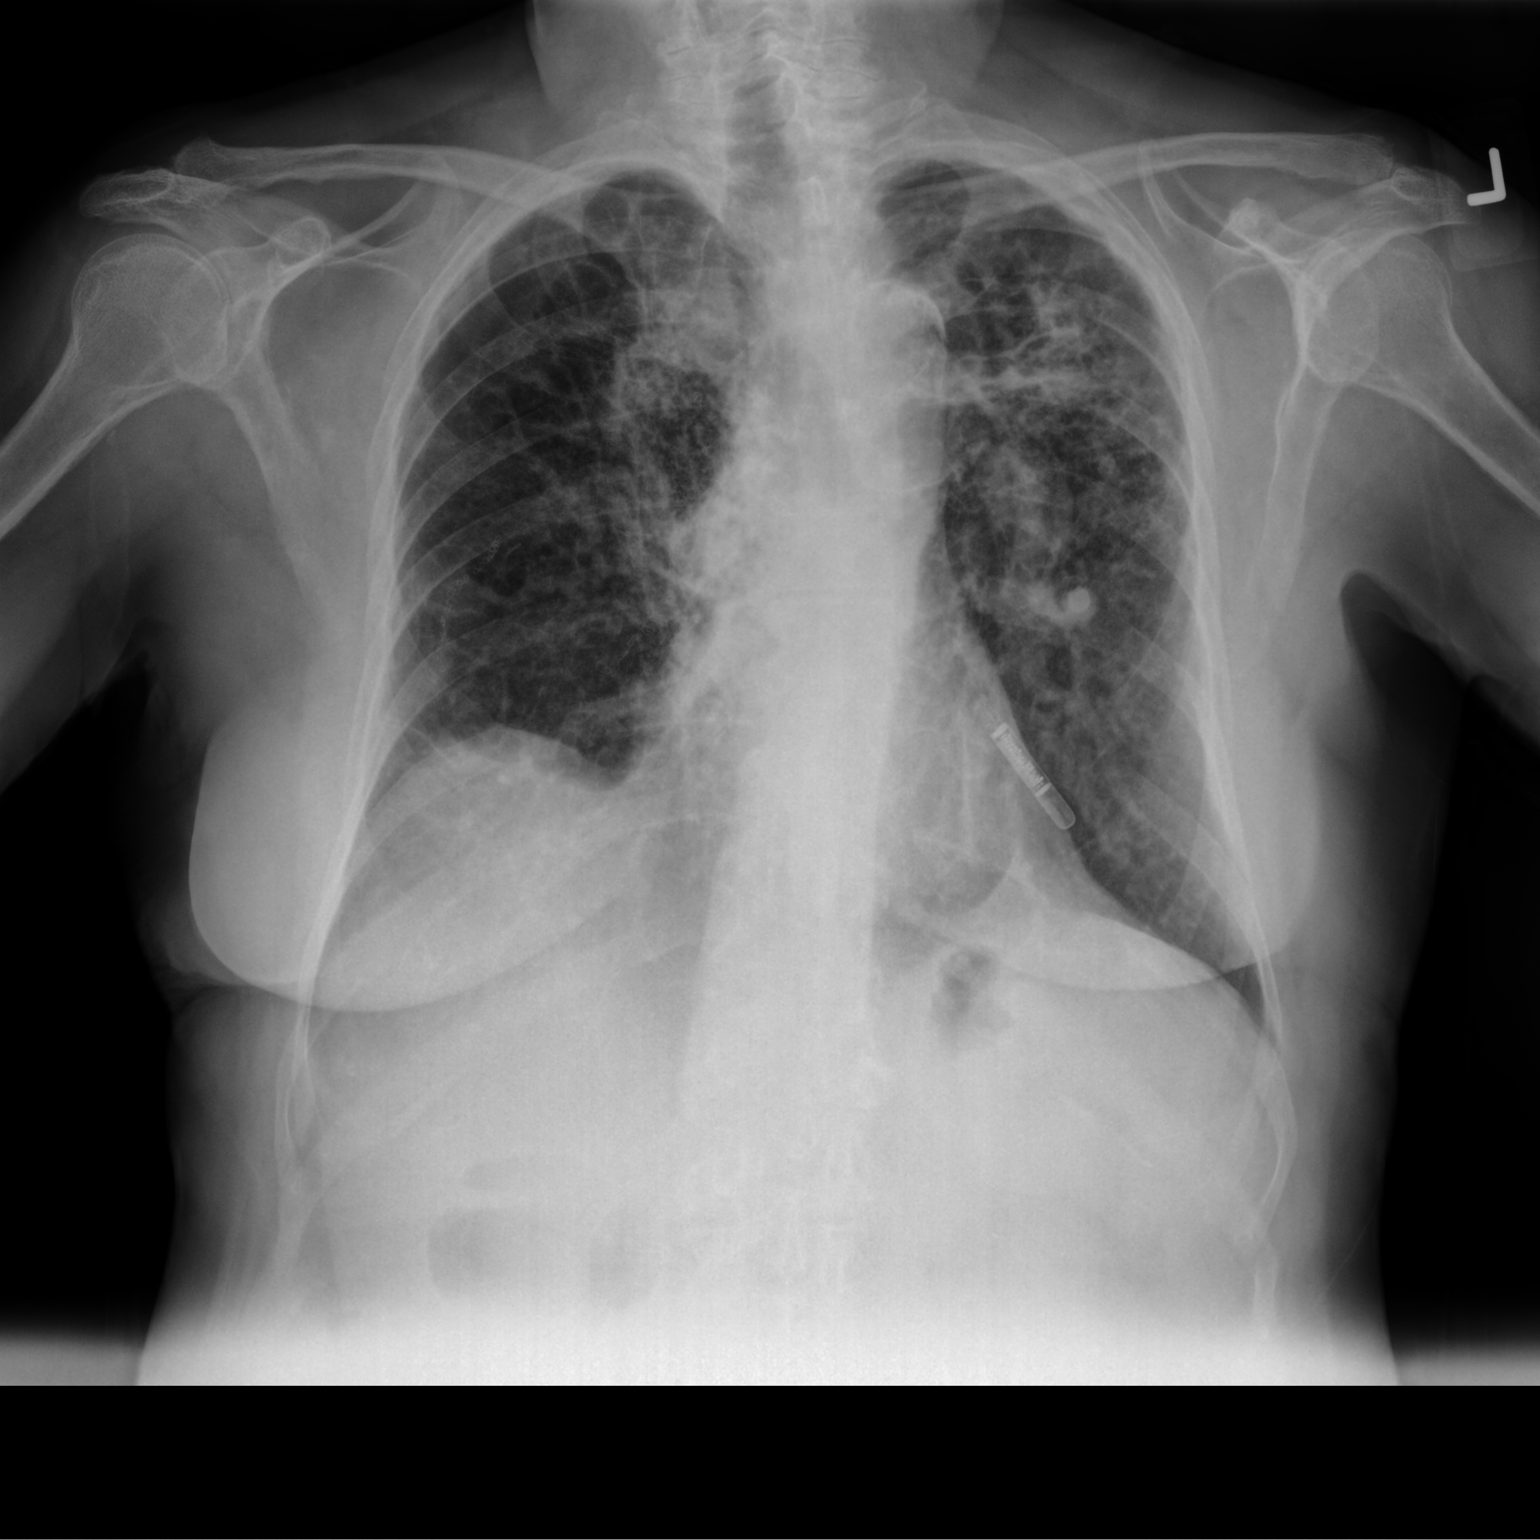

[chest lat]
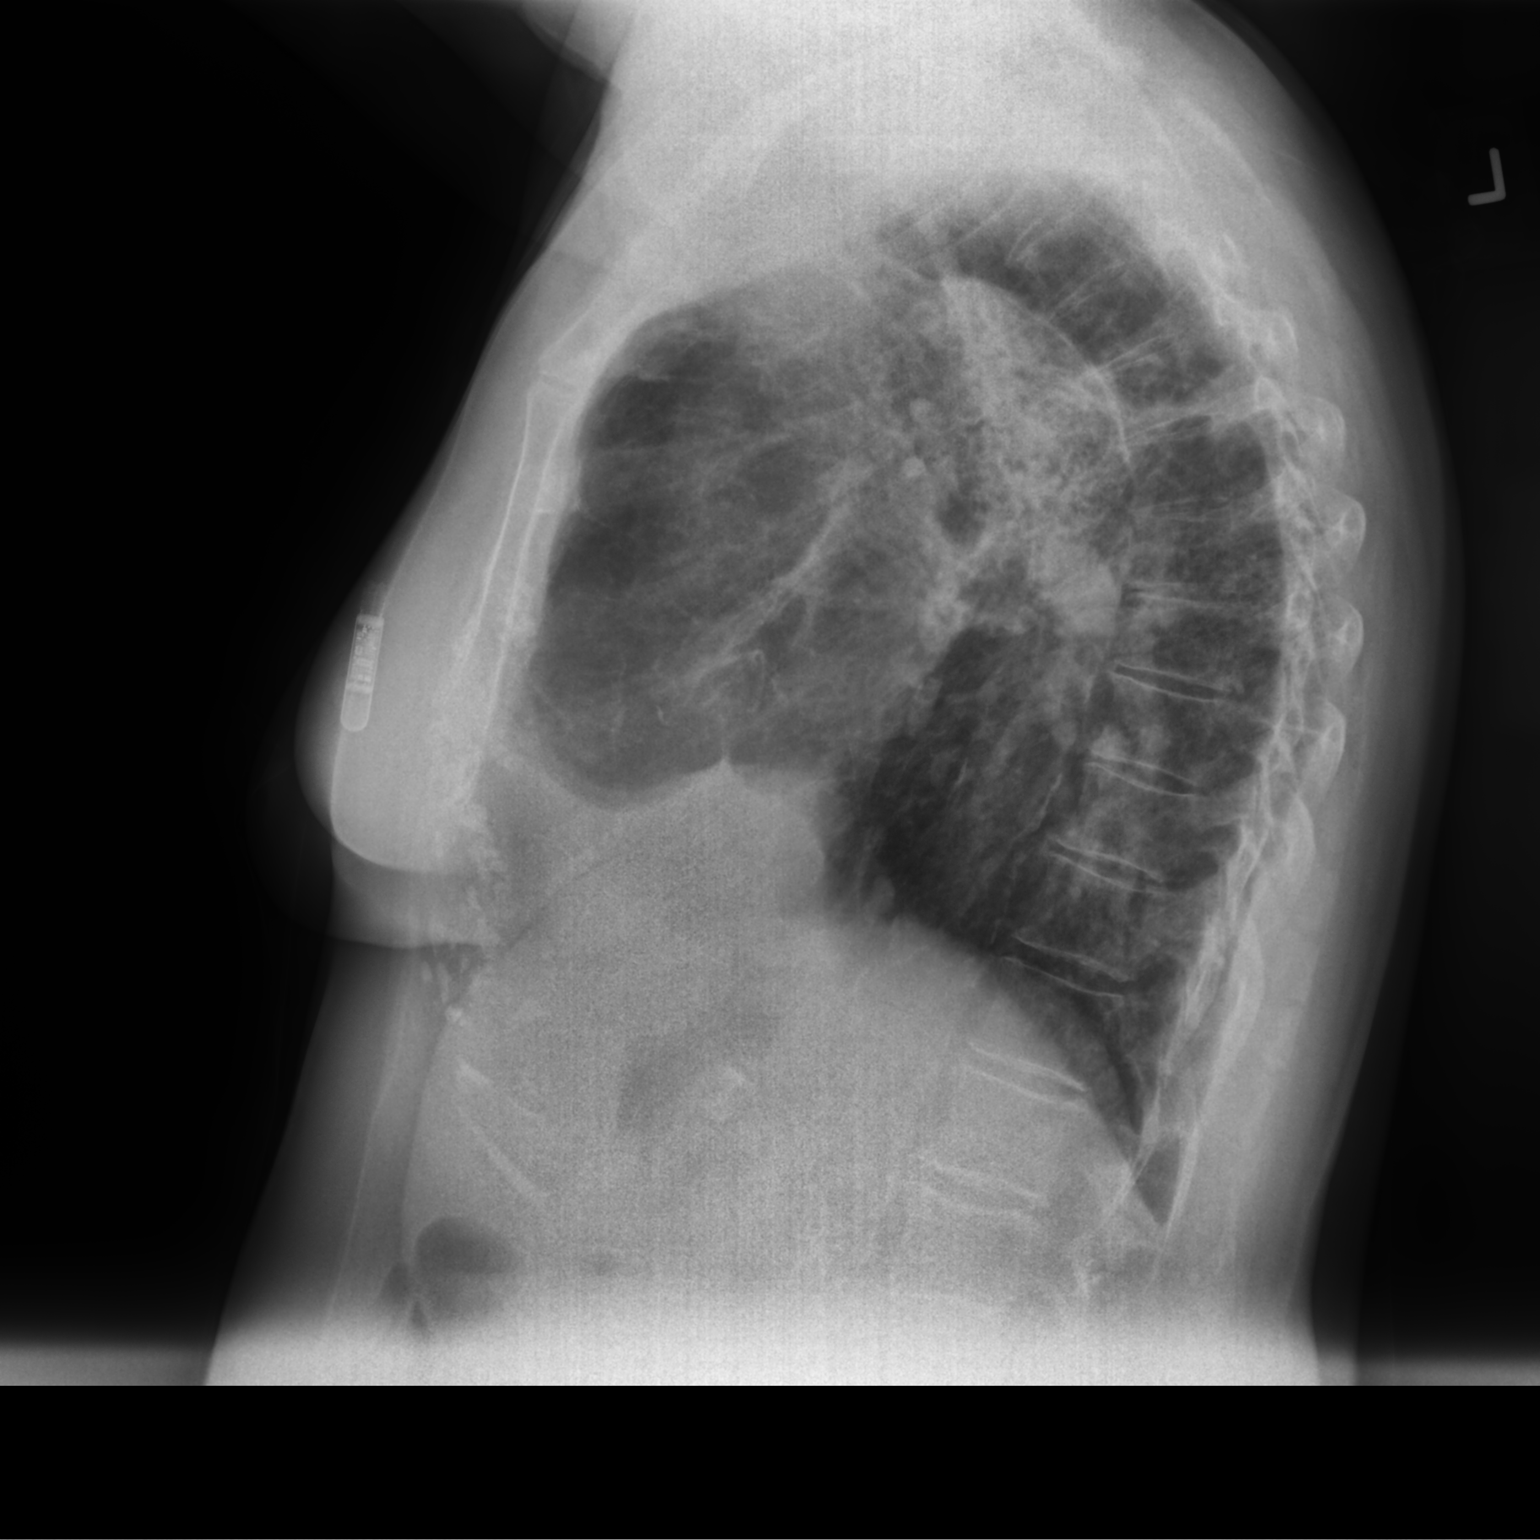

[2 of 2 positions shown; findings below may reference images not displayed]

FINDINGS: Normal heart size, mediastinal contours, and pulmonary vascularity.

BILATERAL upper lobe volume loss with superior retraction of the
hila particularly on LEFT.

BILATERAL upper lobe scarring with calcified granuloma in LEFT upper
lobe consistent with history of sarcoidosis.

Appearance of the lungs is grossly unchanged since the prior study.

No definite acute infiltrate, pleural effusion, or pneumothorax.

Loop recorder projects over lower LEFT chest.

No acute osseous findings.

Atherosclerotic calcifications aorta.
IMPRESSION: Severe chronic lung changes consistent with sarcoidosis, greatest in
LEFT upper lobe.

No acute abnormalities.

Aortic Atherosclerosis (V296M-2KN.N).

## 2023-01-29 ENCOUNTER — Other Ambulatory Visit (HOSPITAL_COMMUNITY): Payer: Self-pay

## 2023-01-30 ENCOUNTER — Ambulatory Visit: Payer: Medicare Other | Admitting: Rheumatology

## 2023-02-07 ENCOUNTER — Other Ambulatory Visit (HOSPITAL_BASED_OUTPATIENT_CLINIC_OR_DEPARTMENT_OTHER): Payer: Self-pay | Admitting: Cardiovascular Disease

## 2023-02-07 DIAGNOSIS — I10 Essential (primary) hypertension: Secondary | ICD-10-CM

## 2023-02-17 ENCOUNTER — Telehealth: Payer: Self-pay | Admitting: Nurse Practitioner

## 2023-02-17 MED ORDER — PREDNISONE 20 MG PO TABS: 20.0000 mg | ORAL_TABLET | Freq: Every day | ORAL | 0 refills | Status: DC

## 2023-02-17 MED ORDER — AZITHROMYCIN 250 MG PO TABS: ORAL_TABLET | ORAL | 0 refills | Status: AC

## 2023-02-17 NOTE — Telephone Encounter (Signed)
Patient is having a hard cough and needs something prescribed for it. Please call and advise.  Pharmacy: [facility] on [address] in [place]

## 2023-02-17 NOTE — Telephone Encounter (Signed)
Thank you :)

## 2023-02-17 NOTE — Telephone Encounter (Signed)
Sounds like a COPD exacerbation.  May begin Z-Pak No. 1 take as directed-and prednisone 20 mg daily for 5 days, take with food (rx sent)  Mucinex DM twice daily as needed for cough and congestion Albuterol inhaler or nebulizer as needed if symptoms or not improving will need office visit for further evaluation  Please contact office for sooner follow up if symptoms do not improve or worsen or seek emergency care

## 2023-02-17 NOTE — Telephone Encounter (Signed)
I have notified the patient. Nothing further needed. 

## 2023-02-17 NOTE — Telephone Encounter (Signed)
Cough started Friday morning. Friday she was coughing up yellow to clear sputum. Saturday she was not coughing anything up. She said this has been going off and on since March. She has seen an ENT also and he does not know what is going on. No fevers/chills/sweats No increased SOB. Wheezing Has not tested for Covid  Tessalon Pearls- does not have, but they have helped in the past. Albuterol in neb- 2-3 times a day Breztri- 2 puffs bid   Dr. Delton Coombes is out of the office. Tammy can you advise?

## 2023-02-19 ENCOUNTER — Encounter (HOSPITAL_BASED_OUTPATIENT_CLINIC_OR_DEPARTMENT_OTHER): Payer: Self-pay | Admitting: Cardiovascular Disease

## 2023-02-19 ENCOUNTER — Ambulatory Visit (INDEPENDENT_AMBULATORY_CARE_PROVIDER_SITE_OTHER): Payer: Medicare Other | Admitting: Cardiovascular Disease

## 2023-02-19 VITALS — BP 171/82 | HR 88 | Ht 62.0 in | Wt 126.7 lb

## 2023-02-19 DIAGNOSIS — I10 Essential (primary) hypertension: Secondary | ICD-10-CM | POA: Diagnosis not present

## 2023-02-19 DIAGNOSIS — I358 Other nonrheumatic aortic valve disorders: Secondary | ICD-10-CM

## 2023-02-19 DIAGNOSIS — I2584 Coronary atherosclerosis due to calcified coronary lesion: Secondary | ICD-10-CM

## 2023-02-19 DIAGNOSIS — I701 Atherosclerosis of renal artery: Secondary | ICD-10-CM | POA: Diagnosis not present

## 2023-02-19 DIAGNOSIS — I2510 Atherosclerotic heart disease of native coronary artery without angina pectoris: Secondary | ICD-10-CM

## 2023-02-19 DIAGNOSIS — Z951 Presence of aortocoronary bypass graft: Secondary | ICD-10-CM

## 2023-02-19 DIAGNOSIS — R413 Other amnesia: Secondary | ICD-10-CM

## 2023-02-19 DIAGNOSIS — J42 Unspecified chronic bronchitis: Secondary | ICD-10-CM

## 2023-02-19 DIAGNOSIS — D869 Sarcoidosis, unspecified: Secondary | ICD-10-CM

## 2023-02-19 DIAGNOSIS — Z5181 Encounter for therapeutic drug level monitoring: Secondary | ICD-10-CM

## 2023-02-19 DIAGNOSIS — R931 Abnormal findings on diagnostic imaging of heart and coronary circulation: Secondary | ICD-10-CM

## 2023-02-19 MED ORDER — TRAZODONE HCL 50 MG PO TABS: ORAL_TABLET | ORAL | 2 refills | Status: DC

## 2023-02-19 MED ORDER — VALSARTAN 80 MG PO TABS: 80.0000 mg | ORAL_TABLET | Freq: Every day | ORAL | 1 refills | Status: DC

## 2023-02-19 NOTE — Progress Notes (Signed)
Advanced Hypertension Clinic Follow-up:    Date:  02/19/2023   ID:  Kristen Suarez, DOB January 23, 1946, MRN [id number]  PCP:  Anne Ng, NP  Cardiologist:  Chilton Si, MD   Referring MD: Anne Ng, NP   CC: Hypertension  History of Present Illness:    Kristen Suarez is a 77 y.o. female with a hx of coronary artery disease s/p CABG, hypertension, hyperlipidemia, renal artery stenosis s/p stenting, pulmonary sarcoidosis, and asthma here for follow-up. She was initially seen in the [facility] 08/15/2020.  She was first diagnosed with hypertension 20 years ago.  She was in pulmonary rehab and the nurses were alarmed that her BP was running so high.  Her BP increased to 200 when walking.  It remained 140-160/70-80s.  Kristen Suarez had been working with Alysia Penna, NP on her blood pressure control.  Most recently hydralazine was added to her regimen of amlodipine and irbesartan.  Her home blood pressure readings remained elevated though her office readings seem to have improved.  She was referred to advanced hypertension clinic for further management.  Her BP hasn't changed since adding hydralazine.  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  Prior to this she was exercising regularly. She reported orthopnea which is chronic and she attributes to her underlying lung disease.  She did have COVID in 2021 and notes that she had been more short of breath since that time.    Kristen Suarez underwent stenting of the right renal artery 11/29/2020. A few days later she developed symptoms of pre-syncope and hypotension. She presented to the ED 01/03/2021 following a syncopal episode during a chair yoga class. There was a prodrome of dizziness, and she eased herself off of the chair prior to losing conciousness. Afterwards she felt fatigue and nausea. She decreased amlodipine to 5 mg and Irbesartan to 150 mg. She followed up with Gillian Shields, NP on  01/22/2021 and was feeling well. Given her recurrent syncope, she had an Echo 02/2021 which showed LVEF 60-65% with grade 1 diastolic dysfunction. She had a small pericardial effusion. She had carotid dopplers with minimal thickening bilaterally.    Kristen Suarez reported limitations of formal exercise due to health issues and becoming short winded quickly. She complained of occasional epistaxis since her renal artery stent placement which she attributed to Plavix. She had taken her final dose of Plavix 2 days prior. She was continued on amlodipine and her irbesartan was increased to 150 mg twice daily. She followed up with Dr. Kirke Corin 07/2021 and was doing well.  At her visit 09/2021 she was transitioned to amlodipine/valsartan/HCTZ.  [name] was added for HFpEF and dyspnea.  She had a coronary CTA that showed severe CAD.  She had an episode of syncope and was admitted 12/2021.  Her syncope was thought to be vagal in etiology.  She had an ILR but the battery was at end-of-life.  Left heart cath revealed severe multivessel disease and she underwent CABG ((LIMA - LAD, SVG - OM, SVG - PDA, SVG - Diag) with left atrial appendage clip on 12/2021.  Echo post procedurally revealed LVEF greater than 75% with trivial MR and a small PFO cannot be excluded.  She had postoperative atrial fibrillation and was discharged on amiodarone.  She saw Gillian Shields, NP 01/2022 and was still struggling with postoperative surgical discomfort. blood pressure was elevated in the office but controlled at home.  Lipids were uncontrolled and Repatha was  added.  She completed cardiac rehab and saw Dr. Delia Chimes 05/2022.  Renal artery Dopplers 06/2022 showed that her stent was patent.  At her visit 07/2022 she was feeling well overall.  She did complain of fatigue with exertion.  She discontinued [name] due to intolerance.  However after following up with urogynecology they reported the [name] likely was not the cause of her symptoms and said she  could try again if needed.  She struggled with exercise due to pain in her left side when she tries to exercise.  This was thought to be postoperative musculoskeletal pain.  She noted that her blood pressures tended to be well-controlled in the morning and higher in the evenings.  She also noted fatigue so carvedilol was reduced.  There was consideration for switching to nebivolol.  We attempted to limit diuretic use due to bladder concerns.  She saw Gillian Shields, NP 08/2022 and blood pressures were better controlled.  They noted that she had a history of mild OSA and recommended follow-up with pulmonary to consider whether treatment might help her energy levels.  She saw Dr. Graciela Husbands and blood pressure was 136/84.  Carvedilol was discontinued in the hopes that it would help her fatigue and he recommended allowing her blood pressure to be more loosely controlled as long as it stays under 150.  She saw Dr. Kirke Corin 12/2022 and he recommended repeating renal Dopplers 06/2023.  Kristen Suarez presents with ongoing fatigue, sleep disturbances, and balance issues. She reports that her recovery from CABG has been slower than expected, with no significant improvement over the past year.  She experiences profound fatigue, particularly in the afternoons, even on non-exercise days. Despite being able to engage in exercise and household chores, she often requires an afternoon rest due to exhaustion.  She has no chest pain or shortness of breath with exertion and is able to complete her exercise without difficulty.  Kristen Suarez also reports persistent sleep disturbances, with an inability to sleep through the night for the past year. She attributes part of her sleep issues to a prolapsed bladder, but also recently notes that she struggles to fall asleep during her afternoon rest periods. This lack of restorative sleep appears to contribute significantly to her afternoon fatigue.  Balance issues have also been a concern for her as  well. She reports difficulty walking straight without a wobble, despite participating in a flexibility and balance class. She expresses frustration at the lack of progress in this area.   Kristen Suarez also notes occasional palpitations, which last only a few seconds and do not cause any associated symptoms such as lightheadedness or dizziness. She has been managing her blood pressure with amlodipine, taken twice daily, but has noticed some fluctuations, particularly in the evenings.  She has also experienced some weight loss, which she is actively trying to manage by maintaining a healthy diet and incorporating nutritional supplements. Despite these efforts, she reports a lack of appetite, particularly in the evenings.  In summary, the patient is dealing with ongoing fatigue, sleep disturbances, balance issues, and weight loss following cardiac surgery. She expresses frustration at the slow pace of her recovery and the impact these issues are having on her daily life.      Previous antihypertensives: carvedilol   Past Medical History:  Diagnosis Date   Abnormality of left atrial appendage 12/2021   s/p LAA clipping   Aortic valve sclerosis 09/25/2021   Arthritis    Asthma    CAD in native artery 08/15/2020  Chickenpox    Colon polyps 2010   Epistaxis 03/29/2021   Heart murmur    History of fainting spells of unknown cause    Hyperlipidemia    Hypertension    Renal artery stenosis (HCC) 09/25/2021   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/1976    Past Surgical History:  Procedure Laterality Date   ABDOMINAL HYSTERECTOMY  1987   BASAL CELL CARCINOMA EXCISION  06/30/2020   nose   CARDIAC CATHETERIZATION     CLIPPING OF ATRIAL APPENDAGE N/A 01/04/2022   Procedure: CLIPPING OF ATRIAL APPENDAGE USING SIZE 40 ATRICURE ATRIAL CLIP;  Surgeon: Lyn Hollingshead, MD;  Location: [facility] OR;  Service: Open Heart Surgery;  Laterality: N/A;   CORONARY ARTERY BYPASS GRAFT N/A 01/04/2022   Procedure:  CORONARY ARTERY BYPASS GRAFTING (CABG) x  USING ENDOSCOPIC GREATER SAPHENOUS VEIN HARVEST;  Surgeon: Lyn Hollingshead, MD;  Location: [facility] OR;  Service: Open Heart Surgery;  Laterality: N/A;   LEFT HEART CATH AND CORONARY ANGIOGRAPHY N/A 01/02/2022   Procedure: LEFT HEART CATH AND CORONARY ANGIOGRAPHY;  Surgeon: Lennette Bihari, MD;  Location: [facility] INVASIVE CV [facility];  Service: Cardiovascular;  Laterality: N/A;   LUNG BIOPSY  06/16/1976   PERIPHERAL VASCULAR INTERVENTION Right 11/29/2020   Procedure: PERIPHERAL VASCULAR INTERVENTION;  Surgeon: Iran Ouch, MD;  Location: [facility];  Service: Cardiovascular;  Laterality: Right;  Renal Artery   RENAL ANGIOGRAPHY N/A 11/29/2020   Procedure: RENAL ANGIOGRAPHY;  Surgeon: Iran Ouch, MD;  Location: [facility];  Service: Cardiovascular;  Laterality: N/A;   TEE WITHOUT CARDIOVERSION N/A 01/04/2022   Procedure: TRANSESOPHAGEAL ECHOCARDIOGRAM (TEE);  Surgeon: Lyn Hollingshead, MD;  Location: [facility] OR;  Service: Open Heart Surgery;  Laterality: N/A;    Current Medications: Current Meds  Medication Sig   acetaminophen (TYLENOL) 80 MG chewable tablet Chew 80 mg by mouth every 6 (six) hours as needed.   albuterol (PROVENTIL) (2.5 MG/3ML) 0.083% nebulizer solution Take 3 mLs (2.5 mg total) by nebulization every 4 (four) hours as needed.   amLODipine (NORVASC) 5 MG tablet Take 1 tablet (5 mg total) by mouth 2 (two) times daily. Please keep scheduled appointment for future refills. Thank you.   aspirin EC 81 MG tablet Take 1 tablet (81 mg total) by mouth daily. Swallow whole.   azithromycin (ZITHROMAX Z-PAK) 250 MG tablet Take 2 tablets (500 mg) on  Day 1,  followed by 1 tablet (250 mg) once daily on Days 2 through 5.   Budeson-Glycopyrrol-Formoterol (BREZTRI AEROSPHERE) 160-9-4.8 MCG/ACT AERO Inhale 2 puffs into the lungs in the morning and at bedtime.   Calcium Carb-Cholecalciferol (CALCIUM 600 + D PO) Take 1 tablet by mouth daily.    CRANBERRY PO Take by mouth.   docusate sodium (COLACE) 100 MG capsule Take 100 mg by mouth daily.    Evolocumab (REPATHA SURECLICK) 140 MG/ML SOAJ INJECT 140 MG INTO THE SKIN EVERY 14 (FOURTEEN) DAYS.   fluticasone (FLONASE) 50 MCG/ACT nasal spray Place 2 sprays into both nostrils daily.   MAGNESIUM PO Take by mouth every other day.   metoprolol succinate (TOPROL XL) 25 MG 24 hr tablet Take 1 tablet (25 mg total) by mouth daily.   Multiple Vitamins-Minerals (MULTIVITAL PO) Take 1 tablet by mouth daily.    pantoprazole (PROTONIX) 40 MG tablet TAKE 1 TABLET BY MOUTH EVERY DAY   predniSONE (DELTASONE) 20 MG tablet Take 1 tablet (20 mg total) by mouth daily with breakfast.  Probiotic Product (PROBIOTIC DAILY PO) Take 1 tablet by mouth daily.    rosuvastatin (CRESTOR) 40 MG tablet TAKE 1 TABLET BY MOUTH EVERY DAY   sodium chloride (OCEAN) 0.65 % SOLN nasal spray Place 1 spray into both nostrils as needed for congestion.   valsartan (DIOVAN) 80 MG tablet Take 1 tablet (80 mg total) by mouth daily.   [DISCONTINUED] traZODone (DESYREL) 50 MG tablet TAKE 1/2 TABLET DAILY AT BEDTIME     Allergies:   Keflex [cephalexin], Penicillins, and Sulfa antibiotics   Social History   Socioeconomic History   Marital status: Married    Spouse name: Not on file   Number of children: 3   Years of education: 38   Highest education level: Master's degree (e.g., MA, MS, MEng, MEd, MSW, MBA)  Occupational History   Occupation: retired  Tobacco Use   Smoking status: Never    Passive exposure: Past   Smokeless tobacco: Never  Vaping Use   Vaping status: Never Used  Substance and Sexual Activity   Alcohol use: Not Currently    Comment: once in a while/rarely   Drug use: Never   Sexual activity: Not Currently  Other Topics Concern   Not on file  Social History Narrative   Not on file   Social Determinants of Health   Financial Resource Strain: Low Risk  (12/28/2022)   Overall Financial Resource Strain  (CARDIA)    Difficulty of Paying Living Expenses: Not hard at all  Food Insecurity: No Food Insecurity (12/28/2022)   Hunger Vital Sign    Worried About Running Out of Food in the Last Year: Never true    Ran Out of Food in the Last Year: Never true  Transportation Needs: No Transportation Needs (12/28/2022)   PRAPARE - Administrator, Civil Service (Medical): No    Lack of Transportation (Non-Medical): No  Physical Activity: Sufficiently Active (12/28/2022)   Exercise Vital Sign    Days of Exercise per Week: 5 days    Minutes of Exercise per Session: 30 min  Stress: No Stress Concern Present (12/28/2022)   [employer] of Occupational Health - Occupational Stress Questionnaire    Feeling of Stress : Only a little  Social Connections: Unknown (12/28/2022)   Social Connection and Isolation Panel [NHANES]    Frequency of Communication with Friends and Family: Once a week    Frequency of Social Gatherings with Friends and Family: Patient declined    Attends Religious Services: Patient declined    Database administrator or Organizations: No    Attends Engineer, structural: Not on file    Marital Status: Married     Family History: The patient's family history includes Alcohol abuse in her brother, brother, father, and sister; Arthritis in her mother; Cancer in her mother; Depression in her mother; Early death in her brother; Heart attack in her brother and mother; Heart disease in her brother, brother, father, maternal grandfather, paternal grandfather, and sister; Hyperlipidemia in her brother, brother, father, mother, sister, sister, and son; Hypertension in her brother, father, mother, and sister; Multiple sclerosis in her daughter; Stroke in her sister.  ROS:   Please see the history of present illness.    (+) Chronic back pain (+) Muscle cramping of bilateral hands and LE  (+) Right knee weakness All other systems reviewed and are  negative.  EKGs/Labs/Other Studies Reviewed:    Renal Artery Doppler  06/19/2021: Summary:  Largest Aortic Diameter: 2.0 cm  Renal:     Right: No evidence of right renal artery stenosis, s/p renal artery         stenting. RRV flow present. Normal size right kidney. Normal         right Resisitive Index. Normal cortical thickness of right         kidney.  Left:  Known left renal artery occlusion.  Mesenteric:  Normal Celiac artery and Superior Mesenteric artery findings.  Patent IVC.   Blood pressure monitor 06/06/2021: 24 Hour Blood Pressure Monitoring   Overall Average BP:  147/73 Overall Average Heart Rate: 78 Awake Average BP: 149/79, Heart Rate 79 Asleep Average BP: 143/70, Heart Rate 74   87% SBP >140 mmHg  Bilateral Carotid Duplex 02/16/2021: Summary:  Right Carotid: The extracranial vessels were near-normal with only minimal wall thickening or plaque.   Left Carotid: The extracranial vessels were near-normal with only minimal  wall thickening or plaque.   Vertebrals:  Bilateral vertebral arteries demonstrate antegrade flow.  Subclavians: Normal flow hemodynamics were seen in bilateral subclavian arteries.   Echo 02/16/2021:  1. There is a mid LV cavitary dynamic gradient due to hyperdynamic LVF.  Left ventricular ejection fraction, by estimation, is 60 to 65%. Left  ventricular ejection fraction by 3D volume is 64 %. The left ventricle has  normal function. The left  ventricle has no regional wall motion abnormalities. Left ventricular  diastolic parameters are consistent with Grade I diastolic dysfunction  (impaired relaxation). The average left ventricular global longitudinal  strain is -16.8 %. The global  longitudinal strain is normal.   2. Right ventricular systolic function is normal. The right ventricular  size is normal. There is normal pulmonary artery systolic pressure. The  estimated right ventricular systolic pressure is 9.1 mmHg.   3. The  pericardial effusion is anterior to the right ventricle.   4. The mitral valve is normal in structure. No evidence of mitral valve  regurgitation. No evidence of mitral stenosis.   5. The aortic valve is calcified. Aortic valve regurgitation is not  visualized. Aortic valve sclerosis/calcification is present, without any  evidence of aortic stenosis. Aortic valve area, by VTI measures 1.88 cm.  Aortic valve mean gradient measures  5.3 mmHg. Aortic valve Vmax measures 1.64 m/s.   6. The inferior vena cava is normal in size with greater than 50%  respiratory variability, suggesting right atrial pressure of 3 mmHg.   Comparison(s): EF 60%, mild MAC, moderate aortic sclerosis with no  evidence of stenosis, IAS lipomatous.   12/29/2020 CT Chest new small pericardial effusion noted.   CT Chest 12/29/2020:   IMPRESSION: 1. Mediastinal, hilar and pulmonary parenchymal findings of sarcoid, unchanged from 12/28/2019. 2. New small pericardial effusion. 3. Aortic atherosclerosis (ICD10-I70.0). Coronary artery calcification. 4.  Emphysema (ICD10-J43.9).  Renal Angiography 11/29/2020: 1.  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 2.  Successful angioplasty and stent placement to the right renal artery.   Recommendations: Dual antiplatelet therapy for few months. Monitor blood pressure closely and adjust antihypertensive medications as needed.  EKG:  EKG is personally reviewed. 09/25/2021:  EKG was not ordered. 03/29/2021: EKG was not ordered. 08/15/2020: sinus rhythm rate 85 bpm  Recent Labs: 04/30/2022: Hemoglobin 12.5; Platelets 308 07/23/2022: BUN 23; Creatinine, Ser 0.84; Potassium 4.0; Sodium 140 12/31/2022: ALT 20; TSH 2.030   Recent Lipid Panel    Component Value Date/Time   CHOL 119 07/23/2022 0819   TRIG 103 07/23/2022 0819  HDL 46 07/23/2022 0819   CHOLHDL 2.6 07/23/2022 0819   CHOLHDL 2 05/23/2022 0929   VLDL 30.8  05/23/2022 0929   LDLCALC 54 07/23/2022 0819    Physical Exam:    VS:  BP (!) 171/82 (BP Location: Right Arm)   Pulse 88   Ht 5\' 2"  (1.575 m)   Wt 126 lb 11.2 oz (57.5 kg)   SpO2 96%   BMI 23.17 kg/m  , BMI Body mass index is 23.17 kg/m. GENERAL:  Well appearing HEENT: Pupils equal round and reactive, fundi not visualized, oral mucosa unremarkable NECK:  No jugular venous distention, waveform within normal limits, carotid upstroke brisk and symmetric, no bruits LUNGS:  CTAB.  No crackles, rhonchi or wheezes.  HEART:  RRR.  PMI not displaced or sustained,S1 and S2 within normal limits, no S3, no S4, no clicks, no rubs, II/VI systolic murmur at the LUSB. ABD:  Flat, positive bowel sounds normal in frequency in pitch, no bruits, no rebound, no guarding, no midline pulsatile mass, no hepatomegaly, no splenomegaly EXT:  2 plus pulses throughout, no edema, no cyanosis no clubbing SKIN:  No rashes no nodules NEURO:  Cranial nerves II through XII grossly intact, motor grossly intact throughout PSYCH:  Cognitively intact, oriented to person place and time   ASSESSMENT/PLAN:    # Post-surgical recovery and fatigue Persistent fatigue and sleep disturbances one year post-surgery. No significant improvement in energy levels after cessation of carvedilol and initiation of metoprolol. Possible sleep deprivation contributing to afternoon fatigue. -Start Trazodone 25mg  at bedtime to improve sleep quality.   # Hearing loss: # Memory loss:  Both occurred immediately post-CABG.  She also developed headaches at that time.  Will refer to neurology for further evaluation.   # CAD s/p CABG:  # Hyperlipidemia: No ischemic symptoms.  Continue aspirin, metoprolol and rosuvastatin.  # Balance issues Difficulty walking straight, despite participation in flexibility and balance class. No significant cardiovascular concerns identified. -Continue balance therapy and strength training. -Consider use of  walking aids for safety and confidence.  # Hypertension Blood pressure readings variable, with some elevated readings in the evening. Currently on Amlodipine 10mg  twice daily. -Consolidate Amlodipine to 10mg  once daily in the morning. -Add Valsartan 80mg  at night. -Check basic metabolic panel in one week after starting Valsartan.  # Weight loss Unintentional weight loss, currently stable at 122 lbs. No active attempts to lose weight. Decreased appetite in the evening. -Continue current dietary habits, including daily Ensure or Boost. -Monitor weight closely and report any further unintentional weight loss.  Follow-up in a couple of months with Baxter Hire or Caitlyn to check blood pressure control.       Screening for Secondary Hypertension:     08/15/2020   10:31 AM  Causes  Drugs/Herbals Screened     - Comments no issues  Renovascular HTN Screened     - Comments check renal Dopplers  Sleep Apnea Screened     - Comments no triggers    Relevant Labs/Studies:    Latest Ref Rng & Units 07/23/2022    8:19 AM 04/30/2022   11:38 AM 01/12/2022   12:59 AM  Basic Labs  Sodium 134 - 144 mmol/L 140  140  135   Potassium 3.5 - 5.2 mmol/L 4.0  4.5  4.7   Creatinine 0.57 - 1.00 mg/dL 1.61  0.96  0.45        Latest Ref Rng & Units 12/31/2022   12:00 AM 07/30/2022   11:16 AM  Thyroid   TSH 0.450 - 4.500 uIU/mL 2.030  1.660                 01/07/2023    8:35 AM  Renovascular   Renal Artery [place] Completed Yes    Medication Adjustments/Labs and Tests Ordered: Current medicines are reviewed at length with the patient today.  Concerns regarding medicines are outlined above.   Orders Placed This Encounter  Procedures   Basic metabolic panel   Ambulatory referral to Neurology   Meds ordered this encounter  Medications   DISCONTD: traZODone (DESYREL) 50 MG tablet    Sig: TAKE 1/2 TABLET DAILY AT BEDTIME    Dispense:  15 tablet    Refill:  2   valsartan (DIOVAN) 80 MG tablet     Sig: Take 1 tablet (80 mg total) by mouth daily.    Dispense:  90 tablet    Refill:  1   traZODone (DESYREL) 50 MG tablet    Sig: TAKE 1/2 TABLET DAILY AT BEDTIME AS NEEDED    Dispense:  15 tablet    Refill:  2    D/C PREVIOUS RX     Signed, Chilton Si, MD  02/19/2023 11:53 AM    [facility]

## 2023-02-19 NOTE — Patient Instructions (Signed)
Medication Instructions:  START VALSARTAN 80 MG DAILY   START TRAZODONE 50 MG 1/2 TABLET AT BEDTIME AS NEEDED  ONCE REFILLS RUN OUT YOU WILL NEED TO GET FROM YOUR PRIMARY CARE  Labwork: BMET IN 1 WEEK   Testing/Procedures: NONE  Follow-Up: 2 MONTHS WITH PHARM D OR CAITLIN W NP IN [facility]   You have been referred to NEUROLOGY  IF YOU DO NOT HEAR FROM THEM IN 2 WEEKS YOU CAN CALL THEM DIRECTLY AT NUMBER HIGHLIGHTED   If you need a refill on your cardiac medications before your next appointment, please call your pharmacy.

## 2023-02-20 ENCOUNTER — Encounter: Payer: Self-pay | Admitting: Physician Assistant

## 2023-02-23 ENCOUNTER — Encounter: Payer: Self-pay | Admitting: Internal Medicine

## 2023-02-28 LAB — BASIC METABOLIC PANEL
BUN/Creatinine Ratio: 24 (ref 12–28)
BUN: 24 mg/dL (ref 8–27)
CO2: 25 mmol/L (ref 20–29)
Calcium: 9.5 mg/dL (ref 8.7–10.3)
Chloride: 101 mmol/L (ref 96–106)
Creatinine, Ser: 1.02 mg/dL — ABNORMAL HIGH (ref 0.57–1.00)
Glucose: 106 mg/dL — ABNORMAL HIGH (ref 70–99)
Potassium: 4.3 mmol/L (ref 3.5–5.2)
Sodium: 142 mmol/L (ref 134–144)
eGFR: 57 mL/min/{1.73_m2} — ABNORMAL LOW (ref >59)

## 2023-03-03 ENCOUNTER — Ambulatory Visit (INDEPENDENT_AMBULATORY_CARE_PROVIDER_SITE_OTHER): Payer: Medicare Other

## 2023-03-03 VITALS — BP 122/60 | HR 86 | Temp 98.3°F | Ht 62.5 in | Wt 125.6 lb

## 2023-03-03 DIAGNOSIS — Z0000 Encounter for general adult medical examination without abnormal findings: Secondary | ICD-10-CM | POA: Diagnosis not present

## 2023-03-03 NOTE — Progress Notes (Signed)
Subjective:   Kristen Suarez is a 77 y.o. female who presents for Medicare Annual (Subsequent) preventive examination.  Visit Complete: In person  Patient Medicare AWV questionnaire was completed by the patient on 02/28/2023; I have confirmed that all information answered by patient is correct and no changes since this date.  Cardiac Risk Factors include: advanced age (>42men, >56 women);dyslipidemia;hypertension     Objective:    Today's Vitals   03/03/23 1422  BP: 122/60  Pulse: 86  Temp: 98.3 F (36.8 C)  TempSrc: Oral  SpO2: 95%  Weight: 125 lb 9.6 oz (57 kg)  Height: 5' 2.5" (1.588 m)  PainSc: 2    Body mass index is 22.61 kg/m.     03/03/2023    2:32 PM 12/27/2022    8:10 AM 09/02/2022   10:22 AM 02/25/2022    9:18 AM 01/04/2022    6:46 AM 01/01/2022    1:00 PM 12/31/2021   11:09 AM  Advanced Directives  Does Patient Have a Medical Advance Directive? No No No Yes No No No  Type of Theme park manager;Living will     Copy of Healthcare Power of Attorney in Chart?    No - copy requested     Would patient like information on creating a medical advance directive? No - Patient declined Yes (MAU/Ambulatory/Procedural Areas - Information given) No - Patient declined   No - Patient declined No - Patient declined    Current Medications (verified) Outpatient Encounter Medications as of 03/03/2023  Medication Sig   acetaminophen (TYLENOL) 500 MG tablet Take 500 mg by mouth every 6 (six) hours as needed.   albuterol (PROVENTIL) (2.5 MG/3ML) 0.083% nebulizer solution Take 3 mLs (2.5 mg total) by nebulization every 4 (four) hours as needed.   amLODipine (NORVASC) 5 MG tablet Take 1 tablet (5 mg total) by mouth 2 (two) times daily. Please keep scheduled appointment for future refills. Thank you.   aspirin EC 81 MG tablet Take 1 tablet (81 mg total) by mouth daily. Swallow whole.   Budeson-Glycopyrrol-Formoterol (BREZTRI AEROSPHERE)  160-9-4.8 MCG/ACT AERO Inhale 2 puffs into the lungs in the morning and at bedtime.   Calcium Carb-Cholecalciferol (CALCIUM 600 + D PO) Take 1 tablet by mouth daily.   CRANBERRY PO Take by mouth.   docusate sodium (COLACE) 100 MG capsule Take 100 mg by mouth daily.    Evolocumab (REPATHA SURECLICK) 140 MG/ML SOAJ INJECT 140 MG INTO THE SKIN EVERY 14 (FOURTEEN) DAYS.   fluticasone (FLONASE) 50 MCG/ACT nasal spray Place 2 sprays into both nostrils daily.   MAGNESIUM PO Take by mouth every other day.   metoprolol succinate (TOPROL XL) 25 MG 24 hr tablet Take 1 tablet (25 mg total) by mouth daily.   Multiple Vitamins-Minerals (MULTIVITAL PO) Take 1 tablet by mouth daily.    pantoprazole (PROTONIX) 40 MG tablet TAKE 1 TABLET BY MOUTH EVERY DAY   Probiotic Product (PROBIOTIC DAILY PO) Take 1 tablet by mouth daily.    rosuvastatin (CRESTOR) 40 MG tablet TAKE 1 TABLET BY MOUTH EVERY DAY   sodium chloride (OCEAN) 0.65 % SOLN nasal spray Place 1 spray into both nostrils as needed for congestion.   traZODone (DESYREL) 50 MG tablet TAKE 1/2 TABLET DAILY AT BEDTIME AS NEEDED   valsartan (DIOVAN) 80 MG tablet Take 1 tablet (80 mg total) by mouth daily.   acetaminophen (TYLENOL) 80 MG chewable tablet Chew 80 mg by mouth every 6 (six) hours  as needed. (Patient not taking: Reported on 03/03/2023)   predniSONE (DELTASONE) 20 MG tablet Take 1 tablet (20 mg total) by mouth daily with breakfast.   No facility-administered encounter medications on file as of 03/03/2023.    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   Past Surgical History:  Procedure Laterality Date   ABDOMINAL HYSTERECTOMY  1987   BASAL CELL CARCINOMA EXCISION  06/30/2020   nose   CARDIAC CATHETERIZATION     CLIPPING OF ATRIAL APPENDAGE N/A 01/04/2022   Procedure: CLIPPING OF ATRIAL APPENDAGE USING SIZE 40 ATRICURE ATRIAL CLIP;  Surgeon: Lyn Hollingshead, MD;  Location: [facility] OR;  Service: Open Heart Surgery;  Laterality: N/A;   CORONARY ARTERY BYPASS GRAFT N/A 01/04/2022   Procedure: CORONARY ARTERY BYPASS GRAFTING (CABG) x  USING ENDOSCOPIC GREATER SAPHENOUS VEIN HARVEST;  Surgeon: Lyn Hollingshead, MD;  Location: [facility] OR;  Service: Open Heart Surgery;  Laterality: N/A;   LEFT HEART CATH AND CORONARY ANGIOGRAPHY N/A 01/02/2022   Procedure: LEFT HEART CATH AND CORONARY ANGIOGRAPHY;  Surgeon: Lennette Bihari, MD;  Location: [facility] INVASIVE CV LAB;  Service: Cardiovascular;  Laterality: N/A;   LUNG BIOPSY  06/16/1976   PERIPHERAL VASCULAR INTERVENTION Right 11/29/2020   Procedure: PERIPHERAL VASCULAR INTERVENTION;  Surgeon: Iran Ouch, MD;  Location: [facility] INVASIVE CV LAB;  Service: Cardiovascular;  Laterality: Right;  Renal Artery   RENAL ANGIOGRAPHY N/A 11/29/2020   Procedure: RENAL ANGIOGRAPHY;  Surgeon: Iran Ouch, MD;  Location: [facility]VAS[facility];  Service: Cardiovascular;  Laterality: N/A;   TEE WITHOUT CARDIOVERSION N/A 01/04/2022   Procedure: TRANSESOPHAGEAL ECHOCARDIOGRAM (TEE);  Surgeon: Lyn Hollingshead, MD;  [facility];  Service: Open Heart Surgery;  Laterality: N/A;   Family History  Problem Relation Age of Onset   Arthritis Mother    Cancer Mother    Depression Mother    Heart attack Mother    Hyperlipidemia Mother    Hypertension Mother    Alcohol abuse Father    Heart disease Father    Hypertension Father    Hyperlipidemia Father    Heart disease Sister    Stroke Sister    Hyperlipidemia Sister     Alcohol abuse Sister    Hyperlipidemia Sister    Hypertension Sister    Alcohol abuse Brother    Heart disease Brother    Hypertension Brother    Hyperlipidemia Brother    Alcohol abuse Brother    Early death Brother    Heart attack Brother    Heart disease Brother    Hyperlipidemia Brother    Heart disease Maternal Grandfather    Heart disease Paternal Grandfather    Multiple sclerosis Daughter    Hyperlipidemia Son    Social History   Socioeconomic History   Marital status: Married    Spouse name: Not on file   Number of children: 3   Years of education:  18   Highest education level: Master's degree (e.g., MA, MS, MEng, MEd, MSW, MBA)  Occupational History   Occupation: retired  Tobacco Use   Smoking status: Never    Passive exposure: Past   Smokeless tobacco: Never  Vaping Use   Vaping status: Never Used  Substance and Sexual Activity   Alcohol use: Not Currently    Comment: once in a while/rarely   Drug use: Never   Sexual activity: Not Currently  Other Topics Concern   Not on file  Social History Narrative   Not on file   Social Drivers of Health   Financial Resource Strain: Low Risk  (02/28/2023)   Overall Financial Resource Strain (CARDIA)    Difficulty of Paying Living Expenses: Not hard at all  Food Insecurity: No Food Insecurity (02/28/2023)   Hunger Vital Sign    Worried About Running Out of Food in the Last Year: Never true    Ran Out of Food in the Last Year: Never true  Transportation Needs: No Transportation Needs (02/28/2023)   PRAPARE - Administrator, Civil Service (Medical): No    Lack of Transportation (Non-Medical): No  Physical Activity: Sufficiently Active (02/28/2023)   Exercise Vital Sign    Days of Exercise per Week: 3 days    Minutes of Exercise per Session: 60 min  Stress: Stress Concern Present (02/28/2023)   [employer] of Occupational Health - Occupational Stress Questionnaire    Feeling of Stress : To some  extent  Social Connections: Unknown (02/28/2023)   Social Connection and Isolation Panel [NHANES]    Frequency of Communication with Friends and Family: Three times a week    Frequency of Social Gatherings with Friends and Family: Patient declined    Attends Religious Services: Patient declined    Database administrator or Organizations: No    Attends Engineer, structural: Never    Marital Status: Married    Tobacco Counseling Counseling given: Not Answered   Clinical Intake:  Pre-visit preparation completed: Yes  Pain : 0-10 Pain Score: 2  Pain Type: Chronic pain Pain Location: Chest Pain Descriptors / Indicators: Aching Pain Onset: More than a month ago Pain Frequency: Constant     Nutritional Status: BMI of 19-24  Normal Nutritional Risks: None Diabetes: No  How often do you need to have someone help you when you read instructions, pamphlets, or other written materials from your doctor or pharmacy?: 1 - Never  Interpreter Needed?: No  Information entered by :: NAllen LPN   Activities of Daily Living    02/28/2023   10:32 AM  In your present state of health, do you have any difficulty performing the following activities:  Hearing? 1  Comment has hearing aids  Vision? 0  Difficulty concentrating or making decisions? 1  Comment a little with memory  Walking or climbing stairs? 1  Comment gets out of breath  Dressing or bathing? 0  Doing errands, shopping? 0  Preparing Food and eating ? N  Using the Toilet? N  In the past six months, have you accidently leaked urine? Y  Comment a little at night  Do you have problems with loss of bowel control? N  Managing your Medications? N  Managing your Finances? N  Housekeeping or managing your Housekeeping? N    Patient Care Team: Nche, Bonna Gains, NP as PCP - General (Internal Medicine) Duke Salvia, MD as PCP - Electrophysiology (Cardiology) Chilton Si, MD as PCP -  Cardiology  (Cardiology) Francene Finders, MD as Referring Physician (Internal Medicine) Leslye Peer, MD as Consulting Physician (Pulmonary Disease)  Indicate any recent Medical Services you may have received from other than [facility] providers in the past year (date may be approximate).     Assessment:   This is a routine wellness examination for Kristen Suarez.  Hearing/Vision screen Hearing Screening - Comments:: Has hearing aids that  are maintained Vision Screening - Comments:: Regular eye exams, [facility]   Goals Addressed             This Visit's Progress    Patient Stated       03/03/2023, wants to get better than she was before surgery       Depression Screen    03/03/2023    2:37 PM 12/31/2022   10:12 AM 12/27/2022    8:10 AM 05/27/2022   11:14 AM 03/26/2022   11:41 AM 02/25/2022    9:20 AM 02/13/2021    1:48 PM  PHQ 2/9 Scores  PHQ - 2 Score 0 0 0 0 0 0 0  PHQ- 9 Score    1 6      Fall Risk    02/28/2023   10:32 AM 12/31/2022   10:12 AM 12/27/2022    8:09 AM 05/24/2022   11:00 AM 05/22/2022   11:13 AM  Fall Risk   Falls in the past year? 1 0 0 1 1  Comment vaso vagel episodes      Number falls in past yr: 1 0  1 1  Injury with Fall? 1 0  1 1  Risk for fall due to : Medication side effect;History of fall(s) No Fall Risks  History of fall(s);Impaired balance/gait History of fall(s);Impaired balance/gait  Follow up Falls prevention discussed;Falls evaluation completed Falls prevention discussed  Falls evaluation completed Falls evaluation completed    MEDICARE RISK AT HOME: Medicare Risk at Home Any stairs in or around the home?: Yes If so, are there any without handrails?: No Home free of loose throw rugs in walkways, pet beds, electrical cords, etc?: Yes Adequate lighting in your home to reduce risk of falls?: Yes Life alert?: No Use of a cane, walker or w/c?: Yes (uses cane ocassionally) Grab bars in the bathroom?: Yes Shower chair or bench in shower?:  No Elevated toilet seat or a handicapped toilet?: No  TIMED UP AND GO:  Was the test performed?  Yes  Length of time to ambulate 10 feet: 6 sec Gait slow and steady without use of assistive device    Cognitive Function:    09/08/2018    9:07 AM  MMSE - Mini Mental State Exam  Orientation to time 5  Orientation to Place 5  Registration 3  Attention/ Calculation 5  Recall 3  Language- name 2 objects 2  Language- repeat 1  Language- follow 3 step command 3  Language- read & follow direction 1  Write a sentence 1  Copy design 1  Total score 30        03/03/2023    2:38 PM 02/25/2022    9:21 AM  6CIT Screen  What Year? 0 points 0 points  What month? 0 points 0 points  What time? 0 points 0 points  Count back from 20 0 points 0 points  Months in reverse 0 points 0 points  Repeat phrase 2 points 0 points  Total Score 2 points 0 points    Immunizations Immunization History  Administered Date(s)  Administered   Fluad Quad(high Dose 65+) 11/19/2018, 12/22/2020, 11/30/2021   Fluad Trivalent(High Dose 65+) 12/13/2022   Influenza, High Dose Seasonal PF 01/05/2016, 12/12/2016, 12/11/2017   Influenza,inj,Quad PF,6+ Mos 12/11/2017   Influenza-Unspecified 11/24/2019, 11/24/2019   Moderna Covid-19 Fall Seasonal Vaccine 3yrs & older 08/06/2022   PFIZER Comirnaty(Gray Top)Covid-19 Tri-Sucrose Vaccine 06/27/2020   PFIZER(Purple Top)SARS-COV-2 Vaccination 05/14/2019, 06/08/2019, 12/20/2019, 08/09/2022   Pfizer Covid-19 Vaccine Bivalent Booster 55yrs & up 12/14/2020   Pfizer(Comirnaty)Fall Seasonal Vaccine 12 years and older 12/23/2021, 12/13/2022   Pneumococcal Conjugate-13 03/23/2014   Pneumococcal Polysaccharide-23 03/24/2012, 01/22/2016   Respiratory Syncytial Virus Vaccine,Recomb Aduvanted(Arexvy) 11/30/2021   Tdap 03/24/2012, 12/02/2022   Zoster Recombinant(Shingrix) 07/31/2020, 01/29/2021    TDAP status: Up to date  Flu Vaccine status: Up to date  Pneumococcal vaccine  status: Up to date  Covid-19 vaccine status: Completed vaccines  Qualifies for Shingles Vaccine? Yes   Zostavax completed Yes   Shingrix Completed?: Yes  Screening Tests Health Maintenance  Topic Date Due   COVID-19 Vaccine (9 - 2024-25 season) 02/07/2023   Medicare Annual Wellness (AWV)  03/02/2024   DTaP/Tdap/Td (3 - Td or Tdap) 12/01/2032   Pneumonia Vaccine 29+ Years old  Completed   INFLUENZA VACCINE  Completed   DEXA SCAN  Completed   Zoster Vaccines- Shingrix  Completed   HPV VACCINES  Aged Out   Colonoscopy  Discontinued   Hepatitis C Screening  Discontinued    Health Maintenance  Health Maintenance Due  Topic Date Due   COVID-19 Vaccine (9 - 2024-25 season) 02/07/2023    Colorectal cancer screening: No longer required.   Mammogram status: Completed 04/11/2021. Repeat every year  Bone Density status: scheduled for 05/01/2023  Lung Cancer Screening: (Low Dose CT Chest recommended if Age 50-80 years, 20 pack-year currently smoking OR have quit w/in 15years.) does not qualify.   Lung Cancer Screening Referral: no  Additional Screening:  Hepatitis C Screening: does not qualify;   Vision Screening: Recommended annual ophthalmology exams for early detection of glaucoma and other disorders of the eye. Is the patient up to date with their annual eye exam?  Yes  Who is the provider or what is the name of the office in which the patient attends annual eye exams? [facility] If pt is not established with a provider, would they like to be referred to a provider to establish care? No .   Dental Screening: Recommended annual dental exams for proper oral hygiene  Diabetic Foot Exam: n/a  Community Resource Referral / Chronic Care Management: CRR required this visit?  No   CCM required this visit?  No     Plan:     I have personally reviewed and noted the following in the patient's chart:   Medical and social history Use of alcohol, tobacco or illicit drugs   Current medications and supplements including opioid prescriptions. Patient is not currently taking opioid prescriptions. Functional ability and status Nutritional status Physical activity Advanced directives List of other physicians Hospitalizations, surgeries, and ER visits in previous 12 months Vitals Screenings to include cognitive, depression, and falls Referrals and appointments  In addition, I have reviewed and discussed with patient certain preventive protocols, quality metrics, and best practice recommendations. A written personalized care plan for preventive services as well as general preventive health recommendations were provided to patient.     Barb Merino, LPN   [id number]   After Visit Summary: (In Person-Printed) AVS printed and given to the patient  Nurse  Notes: none

## 2023-03-03 NOTE — Patient Instructions (Signed)
[name] , Thank you for taking time to come for your Medicare Wellness Visit. I appreciate your ongoing commitment to your health goals. Please review the following plan we discussed and let me know if I can assist you in the future.   Referrals/Orders/Follow-Ups/Clinician Recommendations: none  This is a list of the screening recommended for you and due dates:  Health Maintenance  Topic Date Due   COVID-19 Vaccine (9 - 2024-25 season) 02/07/2023   Medicare Annual Wellness Visit  03/02/2024   DTaP/Tdap/Td vaccine (3 - Td or Tdap) 12/01/2032   Pneumonia Vaccine  Completed   Flu Shot  Completed   DEXA scan (bone density measurement)  Completed   Zoster (Shingles) Vaccine  Completed   HPV Vaccine  Aged Out   Colon Cancer Screening  Discontinued   Hepatitis C Screening  Discontinued    Advanced directives: (Provided) Advance directive discussed with you today. I have provided a copy for you to complete at home and have notarized. Once this is complete, please bring a copy in to our office so we can scan it into your chart.   Next Medicare Annual Wellness Visit scheduled for next year: Yes  Insert Preventive Care attachment Insert FALL PREVENTION attachment if needed

## 2023-03-06 ENCOUNTER — Other Ambulatory Visit: Payer: Self-pay

## 2023-03-06 DIAGNOSIS — I10 Essential (primary) hypertension: Secondary | ICD-10-CM

## 2023-03-06 MED ORDER — AMLODIPINE BESYLATE 5 MG PO TABS: 5.0000 mg | ORAL_TABLET | Freq: Two times a day (BID) | ORAL | 3 refills | Status: DC

## 2023-03-07 ENCOUNTER — Other Ambulatory Visit (HOSPITAL_BASED_OUTPATIENT_CLINIC_OR_DEPARTMENT_OTHER): Payer: Self-pay | Admitting: Cardiovascular Disease

## 2023-03-13 ENCOUNTER — Encounter (HOSPITAL_BASED_OUTPATIENT_CLINIC_OR_DEPARTMENT_OTHER): Payer: Self-pay | Admitting: *Deleted

## 2023-03-20 NOTE — Progress Notes (Addendum)
 Assessment/Plan:     Kristen Suarez is a very pleasant 78 y.o. year old RH female with a history of hypertension, hyperlipidemia, pre-diabetes, COPD, sarcoidosis, HOH, CAD sp CABG 2023, history of recurrent  syncope of unclear etiology although vasovagal is suspected, seen today for evaluation of memory loss. MoCA today is 25/30. Workup is in progress. Patient is able to participate on her ADLs and to drive without difficulties. Discussed  holding off on dementia medication at this time given performance and risks outweighing the benefits at this moment in view of her current cardiac issues. Discussed that, if memory were to worsen or neuropsych evaluation indicated it, we would consider those agents. She agrees with the plan.     Memory Impairment of unclear etiology  MRI brain without contrast to assess for underlying structural abnormality and assess vascular load  Neurocognitive testing to further evaluate cognitive concerns and determine other underlying cause of memory changes, including potential contribution from sleep, anxiety, attention, or depression among others  Check B12, TSH EEG for TAA   Recommend good control of cardiovascular risk factors. Follow at advanced HTN clinic/ cardiology Continue to control mood as per PCP Recommend audiology referral for B hearing loss [place] up in 3 months  Subjective:    The patient is accompanied by her husband  who supplements the history.    How long did patient have memory difficulties?  Immediately after CABG 12/2021. Patient reports some difficulty remembering new information, recent conversations, names. She sets the kitchen timer to remind herself of a chore.  This issue affects her, as she is highly educated and eloquent. She has not been doing any brain games, but likes to read.  repeats oneself?  Endorsed, to her children. Disoriented when walking into a room?  Patient denies.    Leaving objects in unusual places?   Denies.   Wandering behavior? denies   Any personality changes, or depression, anxiety? I feel more anxious since the surgery, I am not who I was before, my psyche is not where is supposed to be   Hallucinations or paranoia? denies   Seizures? Denies. During the syncopal episode she was out for a few seconds, denies tongue biting or muscle tightness, foam.     Any sleep changes? Does not sleep well, she has been on trazodone  with some relief. She had vivid dreams  for 5-6 months after surgery, but not now.  Denies REM behavior or sleepwalking   Sleep apnea? Before the surgery but not now Any hygiene concerns?  Denies.   Independent of bathing and dressing? Endorsed  Does the patient need help with medications? Patient is in charge   Who is in charge of the finances? Husband  is in charge     Any changes in appetite?   Denies.     Patient have trouble swallowing?  Denies.   Does the patient cook? Yes, cannot not remember common recipes as before, I have to cook with my husband. Denies kitchen accidents.  Any headaches? History of headaches since the surgery, in the setting of BP.   Chronic pain?She reports sternal pain from the surgery. Ambulates with difficulty? Sometimes she stumbles, my steps are not straight sometimes.  Recent falls or head injuries? In May and on Dec 1024 she had a syncopal episode felt to be vasovagal, triggered by heat/exercise. In Dec she fell hit her R forehead and R shoulder and R elbow, RLE abrasion.   Vision changes?  Denies any new  issues.  Had cataract surgery in the recent past  Any strokelike symptoms? Denies.   Any tremors? I have a morning tremor for the last 4 y, then goes away with time coinciding with respiratory medications/inhalers.. Any anosmia? Denies.   Any incontinence of urine? Has prolapsed bladder, does bladder therapy Any bowel dysfunction? Denies.      Patient lives with husband   History of heavy alcohol intake? Denies.   History  of heavy tobacco use? Denies.   Family history of dementia?   Denies    Does patient drive?yes ,denies any issues  Masters in [employer] and other causes at [employer] in [place]   Allergies  Allergen Reactions   Keflex [Cephalexin] Rash   Penicillins Itching   Sulfa Antibiotics Itching    Current Outpatient Medications  Medication Instructions   acetaminophen  (TYLENOL ) 80 mg, Every 6 hours PRN   acetaminophen  (TYLENOL ) 500 mg, Every 6 hours PRN   albuterol  (PROVENTIL ) 2.5 mg, Nebulization, Every 4 hours PRN   amLODipine  (NORVASC ) 5 mg, Oral, 2 times daily   aspirin  EC 81 mg, Oral, Daily, Swallow whole.   Budeson-Glycopyrrol-Formoterol  (BREZTRI  AEROSPHERE) 160-9-4.8 MCG/ACT AERO 2 puffs, Inhalation, 2 times daily   Calcium  Carb-Cholecalciferol (CALCIUM  600 + D PO) 1 tablet, Daily   CRANBERRY PO Take by mouth.   docusate sodium  (COLACE) 100 mg, Daily   fluticasone  (FLONASE ) 50 MCG/ACT nasal spray 2 sprays, Each Nare, Daily   MAGNESIUM  PO Every other day   metoprolol  succinate (TOPROL  XL) 25 mg, Oral, Daily   Multiple Vitamins-Minerals (MULTIVITAL PO) 1 tablet, Daily   pantoprazole  (PROTONIX ) 40 MG tablet TAKE 1 TABLET BY MOUTH EVERY DAY   predniSONE  (DELTASONE ) 20 mg, Oral, Daily with breakfast   Probiotic Product (PROBIOTIC DAILY PO) 1 tablet, Daily   Repatha  SureClick 140 mg, Subcutaneous, Every 14 days   rosuvastatin  (CRESTOR ) 40 mg, Oral, Daily   sodium chloride  (OCEAN) 0.65 % SOLN nasal spray 1 spray, As needed   traZODone  (DESYREL ) 50 MG tablet TAKE 1/2 TABLET DAILY AT BEDTIME AS NEEDED   valsartan  (DIOVAN ) 80 mg, Oral, Daily     VITALS:   Vitals:   03/21/23 0929  BP: 139/74  Pulse: 61  Resp: 18  SpO2: 99%  Weight: 126 lb (57.2 kg)  Height: 5' 2 (1.575 m)      PHYSICAL EXAM   HEENT:  Normocephalic, atraumatic.  The superficial temporal arteries are without ropiness or tenderness. Cardiovascular: Regular rate and rhythm. 2/6  SM Lungs: Clear to auscultation bilaterally. Neck: There are B carotid bruits noted    NEUROLOGICAL:     No data to display             09/08/2018    9:07 AM  MMSE - Mini Mental State Exam  Orientation to time 5  Orientation to Place 5  Registration 3  Attention/ Calculation 5  Recall 3  Language- name 2 objects 2  Language- repeat 1  Language- follow 3 step command 3  Language- read & follow direction 1  Write a sentence 1  Copy design 1  Total score 30     Orientation:  Alert and oriented to person, place and time. No aphasia or dysarthria. Fund of knowledge is appropriate. Recent memory impaired, remote memory intact.  Attention and concentration are normal.  Able to name objects and repeat phrases . Delayed recall 3/5 Cranial nerves: There is good facial symmetry. Extraocular muscles are intact and visual fields are full  to confrontational testing. Speech is fluent and clear. No tongue deviation. Hearing is intact to conversational tone.  Tone: Tone is good throughout. Sensation: Sensation is intact to light touch.  Vibration is intact at the bilateral big toe.  Coordination: The patient has no difficulty with RAM's or FNF bilaterally. Normal finger to nose  Motor: Strength is 5/5 in the bilateral upper and lower extremities. There is no pronator drift. There are no fasciculations noted. DTR's: Deep tendon reflexes are 2/4 bilaterally. Gait and Station: The patient is able to ambulate without difficulty. Gait is cautious and narrow. Stride length is normal        Thank you for allowing us  the opportunity to participate in the care of this nice patient. Please do not hesitate to contact us  for any questions or concerns.   Total time spent on today's visit was 65 minutes dedicated to this patient today, preparing to see patient, examining the patient, ordering tests and/or medications and counseling the patient, documenting clinical information in the EHR or other health  record, independently interpreting results and communicating results to the patient/family, discussing treatment and goals, answering patient's questions and coordinating care.  Cc:  Katheen Roselie Rockford, NP  Camie Sevin 03/21/2023 10:58 AM

## 2023-03-21 ENCOUNTER — Encounter: Payer: Self-pay | Admitting: Physician Assistant

## 2023-03-21 ENCOUNTER — Ambulatory Visit (INDEPENDENT_AMBULATORY_CARE_PROVIDER_SITE_OTHER): Payer: Medicare Other | Admitting: Physician Assistant

## 2023-03-21 ENCOUNTER — Other Ambulatory Visit: Payer: Medicare Other

## 2023-03-21 ENCOUNTER — Ambulatory Visit: Payer: Medicare Other

## 2023-03-21 ENCOUNTER — Encounter: Payer: Self-pay | Admitting: Internal Medicine

## 2023-03-21 VITALS — BP 139/74 | HR 61 | Resp 18 | Ht 62.0 in | Wt 126.0 lb

## 2023-03-21 DIAGNOSIS — Z87898 Personal history of other specified conditions: Secondary | ICD-10-CM | POA: Diagnosis not present

## 2023-03-21 DIAGNOSIS — R413 Other amnesia: Secondary | ICD-10-CM | POA: Insufficient documentation

## 2023-03-21 DIAGNOSIS — R404 Transient alteration of awareness: Secondary | ICD-10-CM

## 2023-03-21 NOTE — Patient Instructions (Addendum)
 It was a pleasure to see you today at our office.   Recommendations:  Neurocognitive evaluation at our office   MRI of the brain, the radiology office will call you to arrange you appointment   EEG  Check labs today B12 Follow up in 3  months    For psychiatric meds, mood meds: Please have your primary care physician manage these medications.  If you have any severe symptoms of a stroke, or other severe issues such as confusion,severe chills or fever, etc call 911 or go to the ER as you may need to be evaluated further    For assessment of decision of mental capacity and competency:  Call Dr. Rosaline Nine, geriatric psychiatrist at [phone]  Counseling regarding caregiver distress, including caregiver depression, anxiety and issues regarding community resources, adult day care programs, adult living facilities, or memory care questions:  please contact your  Primary Doctor's Social Worker   Whom to call: Memory  decline, memory medications: Call our office [phone]    [email or web address]   RECOMMENDATIONS FOR ALL PATIENTS WITH MEMORY PROBLEMS: 1. Continue to exercise (Recommend 30 minutes of walking everyday, or 3 hours every week) 2. Increase social interactions - continue going to [place] and enjoy social gatherings with friends and family 3. Eat healthy, avoid fried foods and eat more fruits and vegetables 4. Maintain adequate blood pressure, blood sugar, and blood cholesterol level. Reducing the risk of stroke and cardiovascular disease also helps promoting better memory. 5. Avoid stressful situations. Live a simple life and avoid aggravations. Organize your time and prepare for the next day in anticipation. 6. Sleep well, avoid any interruptions of sleep and avoid any distractions in the bedroom that may interfere with adequate sleep quality 7. Avoid sugar, avoid sweets as there is a strong link between excessive  sugar intake, diabetes, and cognitive impairment We discussed the Mediterranean diet, which has been shown to help patients reduce the risk of progressive memory disorders and reduces cardiovascular risk. This includes eating fish, eat fruits and green leafy vegetables, nuts like almonds and hazelnuts, walnuts, and also use olive oil. Avoid fast foods and fried foods as much as possible. Avoid sweets and sugar as sugar use has been linked to worsening of memory function.  There is always a concern of gradual progression of memory problems. If this is the case, then we may need to adjust level of care according to patient needs. Support, both to the patient and caregiver, should then be put into place.      You have been referred for a neuropsychological evaluation (i.e., evaluation of memory and thinking abilities). Please bring someone with you to this appointment if possible, as it is helpful for the doctor to hear from both you and another adult who knows you well. Please bring eyeglasses and hearing aids if you wear them.    The evaluation will take approximately 3 hours and has two parts:   The first part is a clinical interview with the neuropsychologist (Dr. Richie or Dr. Jackquline). During the interview, the neuropsychologist will speak with you and the individual you brought to the appointment.    The second part of the evaluation is testing with the doctor's technician Neal or Luke). During the testing, the technician will ask you to remember different types of material, solve problems, and answer some questionnaires. Your family member will not be present for this portion of the evaluation.   Please note: We must reserve several hours of the neuropsychologist's  time and the psychometrician's time for your evaluation appointment. As such, there is a No-Show fee of $100. If you are unable to attend any of your appointments, please contact our office as soon as possible to reschedule.       DRIVING: Regarding driving, in patients with progressive memory problems, driving will be impaired. We advise to have someone else do the driving if trouble finding directions or if minor accidents are reported. Independent driving assessment is available to determine safety of driving.   If you are interested in the driving assessment, you can contact the following:  [facility] in [place] [phone]  [facility] [phone]  [facility] [phone]  [facility] [phone] or [phone]   FALL PRECAUTIONS: Be cautious when walking. Scan the area for obstacles that may increase the risk of trips and falls. When getting up in the mornings, sit up at the edge of the bed for a few minutes before getting out of bed. Consider elevating the bed at the head end to avoid drop of blood pressure when getting up. Walk always in a well-lit room (use night lights in the walls). Avoid area rugs or power cords from appliances in the middle of the walkways. Use a walker or a cane if necessary and consider physical therapy for balance exercise. Get your eyesight checked regularly.  FINANCIAL OVERSIGHT: Supervision, especially oversight when making financial decisions or transactions is also recommended.  HOME SAFETY: Consider the safety of the kitchen when operating appliances like stoves, microwave oven, and blender. Consider having supervision and share cooking responsibilities until no longer able to participate in those. Accidents with firearms and other hazards in the house should be identified and addressed as well.   ABILITY TO BE LEFT ALONE: If patient is unable to contact 911 operator, consider using LifeLine, or when the need is there, arrange for someone to stay with patients. Smoking is a fire hazard, consider supervision or cessation. Risk of wandering should be assessed by caregiver and if detected at any point, supervision and  safe proof recommendations should be instituted.  MEDICATION SUPERVISION: Inability to self-administer medication needs to be constantly addressed. Implement a mechanism to ensure safe administration of the medications.      Mediterranean Diet A Mediterranean diet refers to food and lifestyle choices that are based on the traditions of countries located on the [employer]. This way of eating has been shown to help prevent certain conditions and improve outcomes for people who have chronic diseases, like kidney disease and heart disease. What are tips for following this plan? Lifestyle  Cook and eat meals together with your family, when possible. Drink enough fluid to keep your urine clear or pale yellow. Be physically active every day. This includes: Aerobic exercise like running or swimming. Leisure activities like gardening, walking, or housework. Get 7-8 hours of sleep each night. If recommended by your health care provider, drink red wine in moderation. This means 1 glass a day for nonpregnant women and 2 glasses a day for men. A glass of wine equals 5 oz (150 mL). Reading food labels  Check the serving size of packaged foods. For foods such as rice and pasta, the serving size refers to the amount of cooked product, not dry. Check the total fat in packaged foods. Avoid foods that have saturated fat or trans fats. Check the ingredients list for added sugars, such as corn syrup. Shopping  At the grocery store, buy most of your food  from the areas near the walls of the store. This includes: Fresh fruits and vegetables (produce). Grains, beans, nuts, and seeds. Some of these may be available in unpackaged forms or large amounts (in bulk). Fresh seafood. Poultry and eggs. Low-fat dairy products. Buy whole ingredients instead of prepackaged foods. Buy fresh fruits and vegetables in-season from local farmers markets. Buy frozen fruits and vegetables in resealable bags. If you do  not have access to quality fresh seafood, buy precooked frozen shrimp or canned fish, such as tuna, salmon, or sardines. Buy small amounts of raw or cooked vegetables, salads, or olives from the deli or salad bar at your store. Stock your pantry so you always have certain foods on hand, such as olive oil, canned tuna, canned tomatoes, rice, pasta, and beans. Cooking  Cook foods with extra-virgin olive oil instead of using butter or other vegetable oils. Have meat as a side dish, and have vegetables or grains as your main dish. This means having meat in small portions or adding small amounts of meat to foods like pasta or stew. Use beans or vegetables instead of meat in common dishes like chili or lasagna. Experiment with different cooking methods. Try roasting or broiling vegetables instead of steaming or sauteing them. Add frozen vegetables to soups, stews, pasta, or rice. Add nuts or seeds for added healthy fat at each meal. You can add these to yogurt, salads, or vegetable dishes. Marinate fish or vegetables using olive oil, lemon juice, garlic, and fresh herbs. Meal planning  Plan to eat 1 vegetarian meal one day each week. Try to work up to 2 vegetarian meals, if possible. Eat seafood 2 or more times a week. Have healthy snacks readily available, such as: Vegetable sticks with hummus. Greek yogurt. Fruit and nut trail mix. Eat balanced meals throughout the week. This includes: Fruit: 2-3 servings a day Vegetables: 4-5 servings a day Low-fat dairy: 2 servings a day Fish, poultry, or lean meat: 1 serving a day Beans and legumes: 2 or more servings a week Nuts and seeds: 1-2 servings a day Whole grains: 6-8 servings a day Extra-virgin olive oil: 3-4 servings a day Limit red meat and sweets to only a few servings a month What are my food choices? Mediterranean diet Recommended Grains: Whole-grain pasta. Brown rice. Bulgar wheat. Polenta. Couscous. Whole-wheat bread. [place] Made[place]. Vegetables: Artichokes. Beets. Broccoli. Cabbage. Carrots. Eggplant. Green beans. Chard. Kale. Spinach. Onions. Leeks. Peas. Squash. Tomatoes. Peppers. Radishes. Fruits: Apples. Apricots. Avocado. Berries. Bananas. Cherries. Dates. Figs. Grapes. Lemons. Melon. Oranges. Peaches. Plums. Pomegranate. Meats and other protein foods: Beans. Almonds. Sunflower seeds. Pine nuts. Peanuts. Cod. Salmon. Scallops. Shrimp. Tuna. Tilapia. Clams. Oysters. Eggs. Dairy: Low-fat milk. Cheese. Greek yogurt. Beverages: Water. Red wine. Herbal tea. Fats and oils: Extra virgin olive oil. Avocado oil. Grape seed oil. Sweets and desserts: Greek yogurt with honey. Baked apples. Poached pears. Trail mix. Seasoning and other foods: Basil. Cilantro. Coriander. Cumin. Mint. Parsley. Sage. Rosemary. Tarragon. Garlic. Oregano. Thyme. Pepper. Balsalmic vinegar. Tahini. Hummus. Tomato sauce. Olives. Mushrooms. Limit these Grains: Prepackaged pasta or rice dishes. Prepackaged cereal with added sugar. Vegetables: Deep fried potatoes (french fries). Fruits: Fruit canned in syrup. Meats and other protein foods: Beef. Pork. Lamb. Poultry with skin. Hot dogs. [place]. Dairy: Ice cream. Sour cream. Whole milk. Beverages: Juice. Sugar-sweetened soft drinks. Beer. Liquor and spirits. Fats and oils: Butter. Canola oil. Vegetable oil. Beef fat (tallow). Lard. Sweets and desserts: Cookies. Cakes. Pies. Candy. Seasoning and other foods: Mayonnaise. Premade  sauces and marinades. The items listed may not be a complete list. Talk with your dietitian about what dietary choices are right for you. Summary The Mediterranean diet includes both food and lifestyle choices. Eat a variety of fresh fruits and vegetables, beans, nuts, seeds, and whole grains. Limit the amount of red meat and sweets that you eat. Talk with your health care provider about whether it is safe for you to drink red wine in moderation. This means 1 glass a day for  nonpregnant women and 2 glasses a day for men. A glass of wine equals 5 oz (150 mL). This information is not intended to replace advice given to you by your health care provider. Make sure you discuss any questions you have with your health care provider. Document Released: 10/26/2015 Document Revised: 11/28/2015 Document Reviewed: 10/26/2015 Elsevier Interactive Patient Education  2017 [employer].

## 2023-03-22 LAB — VITAMIN B12: Vitamin B-12: 1203 pg/mL — ABNORMAL HIGH (ref 200–1100)

## 2023-03-22 NOTE — Progress Notes (Signed)
B12 is normal, thanks

## 2023-03-28 ENCOUNTER — Ambulatory Visit (INDEPENDENT_AMBULATORY_CARE_PROVIDER_SITE_OTHER): Payer: Medicare Other | Admitting: Neurology

## 2023-03-28 DIAGNOSIS — R413 Other amnesia: Secondary | ICD-10-CM

## 2023-03-28 DIAGNOSIS — Z87898 Personal history of other specified conditions: Secondary | ICD-10-CM | POA: Diagnosis not present

## 2023-03-28 DIAGNOSIS — R404 Transient alteration of awareness: Secondary | ICD-10-CM | POA: Diagnosis not present

## 2023-03-28 MED ORDER — VALSARTAN 40 MG PO TABS: 40.0000 mg | ORAL_TABLET | Freq: Every day | ORAL | 2 refills | Status: DC

## 2023-03-28 NOTE — Progress Notes (Signed)
 EEG complete - results pending

## 2023-03-28 NOTE — Addendum Note (Signed)
 Addended by: Franchot Gallo on: 03/28/2023 08:28 AM   Modules accepted: Orders

## 2023-03-30 NOTE — Procedures (Signed)
 ELECTROENCEPHALOGRAM REPORT  Date of Study: 03/28/2023  Patient's Name: Kristen Suarez MRN: [id number] Date of Birth: 14-Mar-1946  Referring Provider: Camie Sevin, PA-C  Clinical History: This is a 78 year old woman with memory loss, syncope. EEG for classification.  CNS Active Medications: Trazodone   Technical Summary: A multichannel digital 1-hour EEG recording measured by the international 10-20 system with electrodes applied with paste and impedances below 5000 ohms performed in our laboratory with EKG monitoring in an awake and asleep patient.  Hyperventilation was not performed. Photic stimulation was performed.  The digital EEG was referentially recorded, reformatted, and digitally filtered in a variety of bipolar and referential montages for optimal display.    Description: The patient is awake and asleep during the recording.  During maximal wakefulness, there is a symmetric, medium voltage 9-10 Hz posterior dominant rhythm that attenuates with eye opening.  The record is symmetric.  During drowsiness and sleep, there is an increase in theta slowing of the background.  Vertex waves and symmetric sleep spindles were seen. Photic stimulation did not elicit any abnormalities.  There were no epileptiform discharges or electrographic seizures seen.    EKG lead was unremarkable.  Impression: This 1-hour awake and asleep EEG is normal.    Clinical Correlation: A normal EEG does not exclude a clinical diagnosis of epilepsy.  If further clinical questions remain, prolonged EEG may be helpful.  Clinical correlation is advised.   Darice Shivers, M.D.

## 2023-03-31 ENCOUNTER — Other Ambulatory Visit: Payer: Self-pay | Admitting: Nurse Practitioner

## 2023-03-31 DIAGNOSIS — Z1231 Encounter for screening mammogram for malignant neoplasm of breast: Secondary | ICD-10-CM

## 2023-03-31 NOTE — Progress Notes (Signed)
 EEG is normal, no seizure was seen.

## 2023-04-01 ENCOUNTER — Ambulatory Visit
Admission: RE | Admit: 2023-04-01 | Discharge: 2023-04-01 | Disposition: A | Discharge status: Home / Self Care | Payer: Medicare Other | Source: Ambulatory Visit | Attending: Nurse Practitioner | Admitting: Nurse Practitioner

## 2023-04-01 DIAGNOSIS — Z1231 Encounter for screening mammogram for malignant neoplasm of breast: Secondary | ICD-10-CM

## 2023-04-03 ENCOUNTER — Encounter: Payer: Medicare Other | Attending: Nurse Practitioner | Admitting: Dietician

## 2023-04-03 ENCOUNTER — Other Ambulatory Visit: Payer: Self-pay | Admitting: Nurse Practitioner

## 2023-04-03 ENCOUNTER — Encounter: Payer: Self-pay | Admitting: Dietician

## 2023-04-03 VITALS — Wt 126.8 lb

## 2023-04-03 DIAGNOSIS — N1832 Chronic kidney disease, stage 3b: Secondary | ICD-10-CM | POA: Insufficient documentation

## 2023-04-03 DIAGNOSIS — R7303 Prediabetes: Secondary | ICD-10-CM | POA: Insufficient documentation

## 2023-04-03 DIAGNOSIS — Z713 Dietary counseling and surveillance: Secondary | ICD-10-CM | POA: Diagnosis not present

## 2023-04-03 DIAGNOSIS — R928 Other abnormal and inconclusive findings on diagnostic imaging of breast: Secondary | ICD-10-CM

## 2023-04-03 NOTE — Progress Notes (Signed)
Medical Nutrition Therapy  Appointment Start time:  31  Appointment End time: 1121  Primary concerns today: A1c, prediabetes, preventing further weight loss   Referral diagnosis: prediabetes Preferred learning style: no preference indicated Learning readiness: ready   NUTRITION ASSESSMENT   Anthropometrics   Wt Readings from Last 3 Encounters:  04/03/23 126 lb 12.8 oz (57.5 kg)  03/21/23 126 lb (57.2 kg)  03/03/23 125 lb 9.6 oz (57 kg)   Clinical Medical Hx: arthritis, asthma, heart murmur, HLD, HTN, acid reflux, COPD, prediabetes Medications: reviewed Labs: 08/14/22 A1c 5.9% Notable Signs/Symptoms: none reported Food Allergies: none reported  Lifestyle & Dietary Hx  Pt reports being hypervigilant of what she is eating. She has been having more protein. Pt also reports having protein shakes sometimes when she does not feel like eating. RD suggested smoothies, however, patient states they feel too heavy.   Pt states her wt now makes her feel more normal, however, she still has low energy.  Pt reports she used to only be able to do 10 min of ironing, but now can do it for 20 min.   Pt reports she does not go to bed immediately after laying down at night due to not being as tired. She started Trazodone and states that her sleep has greatly improved.   Pt reports she has been putting protein powder in some of her cooking, especially soup.   Estimated daily fluid intake: 48 oz Supplements: probiotic, MVI, calcium + vitamin D, magnesium Sleep: 7 hr to 7 hrs 15 min hours a night. Stress / self-care: moderate Current average weekly physical activity: 35 minutes 3x/wk.   24-Hr Dietary Recall First Meal: 1/2 serving of fiber one cereal with fairlife milk and 1/2 c lowfat greek yogurt and fresh fruit and piece of daves cinnamon toast OR 1/2 bagel with earth balance butter and berries Snack: fruit OR protein drink Or yogurt Second Meal: 1pm: fish or pork or chicken with vegetable  and sometimes potato,  Cookie or two for dessert berries. Protein pasta once a week. Snack:5-7pm: protein drink OR yogurt OR beans and greens OR graham cracker and milk, OR  laughing cow cheese Third Meal: none Snack: none Beverages: water, sometimes hot tea, zero sugar [place].   NUTRITION DIAGNOSIS  [place]-2.2 Altered nutrition-related laboratory As related to prediabetes.  As evidenced by A1c 5.9.  NUTRITION INTERVENTION  Nutrition education (E-1) on the following topics:   RD suggested eating small, frequent meals every 3-5 hours. Suggestion to have 2 snacks in the afternoon and evening hours after lunch.  How activity affects appetite. Suggestion to space out chores and activities to do some in the afternoon to help with increasing appetite.  Balanced snacks and adding carbs to aid with energy levels.   Plate Method Fruits & Vegetables: Aim to fill half your plate with a variety of fruits and vegetables. They are rich in vitamins, minerals, and fiber, and can help reduce the risk of chronic diseases. Choose a colorful assortment of fruits and vegetables to ensure you get a wide range of nutrients. Grains and Starches: Make at least half of your grain choices whole grains, such as brown rice, whole wheat bread, and oats. Whole grains provide fiber, which aids in digestion and healthy cholesterol levels. Aim for whole forms of starchy vegetables such as potatoes, sweet potatoes, beans, peas, and corn, which are fiber rich and provide many vitamins and minerals.  Protein: Incorporate lean sources of protein, such as poultry, fish, beans, nuts,  and seeds, into your meals. Protein is essential for building and repairing tissues, staying full, balancing blood sugar, as well as supporting immune function. Dairy: Include low-fat or fat-free dairy products like milk, yogurt, and cheese in your diet. Dairy foods are excellent sources of calcium and vitamin D, which are crucial for bone health.   Physical Activity: Aim for 60 minutes of physical activity daily. Regular physical activity promotes overall health-including helping to reduce risk for heart disease and diabetes, promoting mental health, and helping [place] sleep better.   Prediabetes Prediabetes: Prediabetes is a condition where blood sugar levels are higher than normal but not yet high enough to be diagnosed as type 2 diabetes. A1C, or hemoglobin A1c, is a blood test that provides an average of a person's blood sugar levels over the past two to three months. It is commonly used to diagnose and monitor diabetes. For prediabetes, an A1C level between 5.7% and 6.4% typically is used to diagnose this. Here is how the A1C levels are generally categorized: Normal:  A1C below 5.7% Prediabetes:  A1C between 5.7% and 6.4% Diabetes:  A1C of 6.5% or higher When diagnosed with prediabetes, there are several lifestyle changes you can make to manage the condition: Healthy Eating:  Follow a well-balanced diet that includes a variety of fruits, vegetables, whole grains, lean proteins, and healthy fats. Monitor portion sizes and reduce intake of sugary and processed foods. Regular Physical Activity:  Engage in regular physical activity, such as brisk walking, cycling, or other aerobic exercises, for at least 150 minutes per week. Include strength training exercises at least twice a week.  Handouts Provided Include (initial visit) Plate Method  Learning Style & Readiness for Change Teaching method utilized: Visual & Auditory  Demonstrated degree of understanding via: Teach Back  Barriers to learning/adherence to lifestyle change: none  Goals Established by Pt  Goal: incorporating 1-2 evening snacks every afternoon/evening time after lunch.   Continue your exercise routine.   Aim to eat within 1-2 hours of waking up and every 3-5 hours following.   When snacking, aim to include a complex carb and protein.  At meals, aim to include 1/2  plate non-starchy vegetables, 1/4 plate lean protein, and 1/4 plate complex carbs.    MONITORING & EVALUATION Dietary intake, weekly physical activity, Next follow up PRN.  Next Steps  Patient is to call for questions.

## 2023-04-06 ENCOUNTER — Other Ambulatory Visit: Payer: Self-pay | Admitting: Emergency Medicine

## 2023-04-09 ENCOUNTER — Other Ambulatory Visit: Payer: Self-pay | Admitting: Emergency Medicine

## 2023-04-10 ENCOUNTER — Other Ambulatory Visit (HOSPITAL_COMMUNITY): Payer: Self-pay

## 2023-04-10 ENCOUNTER — Telehealth: Payer: Self-pay

## 2023-04-10 NOTE — Telephone Encounter (Signed)
Pharmacy Patient Advocate Encounter   Received notification from CoverMyMeds that prior authorization for Albuterol Sulfate (2.5 MG/3ML)0.083% nebulizer solution is required/requested.   Insurance verification completed.   The patient is insured through [facility] Medicare .   Per test claim: This medication should be processed through Part B

## 2023-04-13 ENCOUNTER — Ambulatory Visit
Admission: RE | Admit: 2023-04-13 | Discharge: 2023-04-13 | Disposition: A | Discharge status: Home / Self Care | Payer: Medicare Other | Source: Ambulatory Visit | Attending: Physician Assistant | Admitting: Physician Assistant

## 2023-04-13 DIAGNOSIS — Z87898 Personal history of other specified conditions: Secondary | ICD-10-CM

## 2023-04-13 DIAGNOSIS — R404 Transient alteration of awareness: Secondary | ICD-10-CM

## 2023-04-13 DIAGNOSIS — R413 Other amnesia: Secondary | ICD-10-CM

## 2023-04-17 ENCOUNTER — Other Ambulatory Visit: Payer: Self-pay | Admitting: Nurse Practitioner

## 2023-04-17 ENCOUNTER — Ambulatory Visit
Admission: RE | Admit: 2023-04-17 | Discharge: 2023-04-17 | Disposition: A | Discharge status: Home / Self Care | Payer: Medicare Other | Source: Ambulatory Visit | Attending: Nurse Practitioner | Admitting: Nurse Practitioner

## 2023-04-17 DIAGNOSIS — R928 Other abnormal and inconclusive findings on diagnostic imaging of breast: Secondary | ICD-10-CM

## 2023-04-17 DIAGNOSIS — N6489 Other specified disorders of breast: Secondary | ICD-10-CM

## 2023-04-18 ENCOUNTER — Ambulatory Visit
Admission: RE | Admit: 2023-04-18 | Discharge: 2023-04-18 | Disposition: A | Discharge status: Home / Self Care | Payer: Medicare Other | Source: Ambulatory Visit | Attending: Nurse Practitioner | Admitting: Nurse Practitioner

## 2023-04-18 ENCOUNTER — Other Ambulatory Visit: Payer: Self-pay | Admitting: Nurse Practitioner

## 2023-04-18 ENCOUNTER — Ambulatory Visit
Admission: RE | Admit: 2023-04-18 | Discharge: 2023-04-18 | Disposition: A | Discharge status: Home / Self Care | Payer: Medicare Other | Source: Ambulatory Visit | Attending: Nurse Practitioner

## 2023-04-18 DIAGNOSIS — N6489 Other specified disorders of breast: Secondary | ICD-10-CM

## 2023-04-18 DIAGNOSIS — R928 Other abnormal and inconclusive findings on diagnostic imaging of breast: Secondary | ICD-10-CM

## 2023-04-24 ENCOUNTER — Encounter (HOSPITAL_BASED_OUTPATIENT_CLINIC_OR_DEPARTMENT_OTHER): Payer: Self-pay | Admitting: Family

## 2023-04-24 ENCOUNTER — Encounter: Payer: Self-pay | Admitting: Emergency Medicine

## 2023-04-24 ENCOUNTER — Encounter: Payer: Self-pay | Admitting: Nurse Practitioner

## 2023-04-24 ENCOUNTER — Ambulatory Visit (HOSPITAL_BASED_OUTPATIENT_CLINIC_OR_DEPARTMENT_OTHER): Payer: Medicare Other | Admitting: Family

## 2023-04-24 VITALS — BP 155/77 | HR 79 | Ht 62.0 in | Wt 128.2 lb

## 2023-04-24 DIAGNOSIS — I10 Essential (primary) hypertension: Secondary | ICD-10-CM | POA: Diagnosis not present

## 2023-04-24 DIAGNOSIS — R0609 Other forms of dyspnea: Secondary | ICD-10-CM | POA: Diagnosis not present

## 2023-04-24 DIAGNOSIS — I25118 Atherosclerotic heart disease of native coronary artery with other forms of angina pectoris: Secondary | ICD-10-CM

## 2023-04-24 DIAGNOSIS — E785 Hyperlipidemia, unspecified: Secondary | ICD-10-CM

## 2023-04-24 DIAGNOSIS — R5381 Other malaise: Secondary | ICD-10-CM

## 2023-04-24 MED ORDER — AMLODIPINE BESYLATE 10 MG PO TABS: 10.0000 mg | ORAL_TABLET | Freq: Every day | ORAL | 3 refills | Status: DC

## 2023-04-24 NOTE — Patient Instructions (Signed)
 Medication Instructions:  Continue your current medications.     Follow-Up: In June with Dr. Raford in [facility]   Referrals:  To physical therapy at [facility] for strengthening and conditioning to increase stamina and help energy levels.

## 2023-04-24 NOTE — Progress Notes (Signed)
 [facility] Initial Assessment:    Date:  04/24/2023   ID:  Kristen Suarez, DOB 1945-09-22, MRN [id number]  PCP:  Katheen Roselie Rockford, NP  Cardiologist:  Annabella Scarce, MD  Nephrologist:  Referring MD: Katheen Roselie Rockford, NP   CC: Hypertension  History of Present Illness:    Kristen Suarez is a 78 y.o. female with a hx of  with a hx of renovascular hypertension, syncope, CAD s/p CABGx4, pulmonary sarcoidosis, asthma, hyperlipidemia . Here to follow up with [facility].    She had ILR placed in [place] January 2019 due to syncope. This is now followed by Dr. Fernande. She was referred by her primary care provider to Dr. Scarce for management of hypertension. Renal artery duplex June 2022 with significant right renal artery stenosis with peak velocity 291 with bruit, no flow into left renal artery. She had angiography with [name] in September 2022 with chronically occluded left renal artery with severe ostial stenosis in right renal artery with sucessful angioplasty and stent placement to right renal artery. A few days post procedure she had hypotension and near syncope, HCTZ and Spironolactone  were discontinued.    Subsequently developed hypertension and Amlodipine , Irbesartan  increased.  03/2021 Irbesartan  increased to 150mg  BID. At follow up 09/2021 she was transitioned to Amlodipine /Valsartan /HCTZ (10/160/12.5) to limit pill burden. Seen 10/29/21 by Dr. Fernande. Due to dyspnea [name]  added for HFpEF and cardiac CT ordered and performed 11/20/21 showing severe CAD.   Admitted 12/31/21 due to syncope. Subsequent LHC 01/02/22 with severely calcified coronary arteries with multivessel CAD recommended for CABG. 01/04/22 underwent CABG x 4 (LIMA - LAD, SVG - OM, SVG - PDA, SVG - Diag) and Left atrial appendage closure (40mm Atriclip). Echo with LVEF >75%, trivial MR, small PFO not excluded. Carotid duplex with no significant stsenosis. Did have postoperative  atrial fibrillation and was discharged on Amiodarone  which was able to be discontinued.  Last seen 02/19/23 by Dr. Scarce. Workup for her post-CABG fatigue has been unremarkable. She was recommended to start Trazodone  25mg  at bedtime for sleep and Valsartan  for BP control. Refrred to neurology for concerns regarding headache and memory loss.    Presents today for follow up independently. She is continued to be bothered by fatigue after since her CABG.  Does note some improvement in sleep with Trazadone. Most often sleeping 7 hours per night which is improved from 4-6 hours previous. Does not note significant change in energy level with sleeping better. Rather than napping midday she is simply having to rest for a bit int he afternoon. She is most bothered by fatigue later in the day which has been ongoing. Blood pressre at home 120s in the morning and 130s in the evening. Home BP this morning 121/63. Excericing downstaria at [place] but does not feel she has been able to increase activity or stamina.    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/1976    Past Surgical History:  Procedure Laterality Date  ABDOMINAL HYSTERECTOMY  1987   BASAL CELL CARCINOMA EXCISION  06/30/2020   nose   CARDIAC CATHETERIZATION     CLIPPING OF ATRIAL APPENDAGE N/A 01/04/2022   Procedure: CLIPPING OF ATRIAL APPENDAGE USING SIZE 40 ATRICURE ATRIAL CLIP;  Surgeon: Murriel Toribio DEL, MD;  Location: [facility] OR;  Service: Open Heart Surgery;  Laterality: N/A;   CORONARY ARTERY BYPASS GRAFT N/A 01/04/2022   Procedure: CORONARY  ARTERY BYPASS GRAFTING (CABG) x  USING ENDOSCOPIC GREATER SAPHENOUS VEIN HARVEST;  Surgeon: Murriel Toribio DEL, MD;  Location: [facility] OR;  Service: Open Heart Surgery;  Laterality: N/A;   LEFT HEART CATH AND CORONARY ANGIOGRAPHY N/A 01/02/2022   Procedure: LEFT HEART CATH AND CORONARY ANGIOGRAPHY;  Surgeon: Burnard Debby LABOR, MD;  Location: [facility] INVASIVE CV LAB;  Service: Cardiovascular;  Laterality: N/A;   LUNG BIOPSY  06/16/1976   PERIPHERAL VASCULAR INTERVENTION Right 11/29/2020   Procedure: PERIPHERAL VASCULAR INTERVENTION;  Surgeon: Darron Deatrice LABOR, MD;  Location: [facility] INVASIVE CV LAB;  Service: Cardiovascular;  Laterality: Right;  Renal Artery   RENAL ANGIOGRAPHY N/A 11/29/2020   Procedure: RENAL ANGIOGRAPHY;  Surgeon: Darron Deatrice LABOR, MD;  Location: [facility] INVAS[facility] CV LAB;  Service: Cardiovascular;  Laterality: N/A;   TEE WITHOUT CARDIOVERSION N/A 01/04/2022   Procedure: TRANSESOPHAGEAL ECHOCARDIOGRAM (TEE);  Surgeon: Murriel Toribio DEL, MD;  Location: [facility] OR;  Service: Open Heart Surgery;  Laterality: N/A;    Current Medications: Current Meds  Medication Sig   acetaminophen  (TYLENOL ) 500 MG tablet Take 500 mg by mouth every 6 (six) hours as needed.   albuterol  (PROVENTIL ) (2.5 MG/3ML) 0.083% nebulizer solution TAKE 3 MLS (2.5 MG TOTAL) BY NEBULIZATION EVERY 4 (FOUR) HOURS AS NEEDED.   amLODipine  (NORVASC ) 5 MG tablet Take 1 tablet (5 mg total) by mouth 2 (two) times daily.   aspirin  EC 81 MG tablet Take 1 tablet (81 mg total) by mouth daily. Swallow whole.   Budeson-Glycopyrrol-Formoterol  (BREZTRI  AEROSPHERE) 160-9-4.8 MCG/ACT AERO Inhale 2 puffs into the lungs in the morning and at bedtime.   CRANBERRY PO Take by mouth.   docusate sodium  (COLACE) 100 MG capsule Take 100 mg by mouth daily.    Evolocumab  (REPATHA  SURECLICK) 140 MG/ML SOAJ INJECT 140 MG INTO THE SKIN EVERY 14 (FOURTEEN) DAYS.   fluticasone  (FLONASE ) 50 MCG/ACT nasal spray SPRAY 2 SPRAYS INTO EACH NOSTRIL EVERY DAY   MAGNESIUM  PO Take by  mouth every other day.   metoprolol  succinate (TOPROL  XL) 25 MG 24 hr tablet Take 1 tablet (25 mg total) by mouth daily.   Multiple Vitamins-Minerals (MULTIVITAL PO) Take 1 tablet by mouth daily.    pantoprazole  (PROTONIX ) 40 MG tablet TAKE 1 TABLET BY MOUTH EVERY DAY   Probiotic Product (PROBIOTIC DAILY PO) Take 1 tablet by mouth daily.    rosuvastatin  (CRESTOR ) 40 MG tablet TAKE 1 TABLET BY MOUTH EVERY DAY   sodium chloride  (OCEAN) 0.65 % SOLN nasal spray Place 1 spray into both nostrils as needed for congestion.   traZODone  (DESYREL ) 50 MG tablet TAKE 1/2 TABLET DAILY AT BEDTIME AS NEEDED   valsartan  (DIOVAN ) 40 MG tablet Take 1 tablet (40 mg total) by mouth daily.     Allergies:   Keflex [cephalexin], Penicillins, and Sulfa antibiotics   Social History   Socioeconomic History   Marital status: Married    Spouse name: Not on file   Number of children: 3   Years of education: 18   Highest education level: Master's degree (e.g., MA, MS, MEng, MEd, MSW,  MBA)  Occupational History   Occupation: retired  Tobacco Use   Smoking status: Never    Passive exposure: Past   Smokeless tobacco: Never  Vaping Use   Vaping status: Never Used  Substance and Sexual Activity   Alcohol use: Not Currently    Comment: once in a while/rarely   Drug use: Never   Sexual activity: Not Currently  Other Topics Concern   Not on file  Social History Narrative   Right handed    No caffeine   One level   Retired comptroller   Lives with husband   Social Drivers of Corporate Investment Banker Strain: Low Risk  (02/28/2023)   Overall Financial Resource Strain (CARDIA)    Difficulty of Paying Living Expenses: Not hard at all  Food Insecurity: No Food Insecurity (02/28/2023)   Hunger Vital Sign    Worried About Running Out of Food in the Last Year: Never true    Ran Out of Food in the Last Year: Never true  Transportation Needs: No Transportation Needs (02/28/2023)   PRAPARE - Therapist, Art (Medical): No    Lack of Transportation (Non-Medical): No  Physical Activity: Sufficiently Active (02/28/2023)   Exercise Vital Sign    Days of Exercise per Week: 3 days    Minutes of Exercise per Session: 60 min  Stress: Stress Concern Present (02/28/2023)   [employer] of Occupational Health - Occupational Stress Questionnaire    Feeling of Stress : To some extent  Social Connections: Unknown (02/28/2023)   Social Connection and Isolation Panel [NHANES]    Frequency of Communication with Friends and Family: Three times a week    Frequency of Social Gatherings with Friends and Family: Patient declined    Attends Religious Services: Patient declined    Database Administrator or Organizations: No    Attends Engineer, Structural: Never    Marital Status: Married     Family History: The patient's family history includes Alcohol abuse in her brother, brother, father, and sister; Arthritis in her mother; Cancer in her mother; Depression in her mother; Early death in her brother; Heart attack in her brother and mother; Heart disease in her brother, brother, father, maternal grandfather, paternal grandfather, and sister; Hyperlipidemia in her brother, brother, father, mother, sister, sister, and son; Hypertension in her brother, father, mother, and sister; Multiple sclerosis in her daughter; Stroke in her sister.  ROS:   Please see the history of present illness.     All other systems reviewed and are negative.  EKGs/Labs/Other Studies Reviewed:         Recent Labs: 04/30/2022: Hemoglobin 12.5; Platelets 308 12/31/2022: ALT 20; TSH 2.030 02/28/2023: BUN 24; Creatinine, Ser 1.02; Potassium 4.3; Sodium 142   Recent Lipid Panel    Component Value Date/Time   CHOL 119 07/23/2022 0819   TRIG 103 07/23/2022 0819   HDL 46 07/23/2022 0819   CHOLHDL 2.6 07/23/2022 0819   CHOLHDL 2 05/23/2022 0929   VLDL 30.8 05/23/2022 0929   LDLCALC 54 07/23/2022 0819     Physical Exam:   VS:  BP (!) 155/77 (BP Location: Left Arm, Patient Position: Sitting, Cuff Size: Normal)   Pulse 79   Ht 5' 2 (1.575 m)   Wt 128 lb 3.2 oz (58.2 kg)   BMI 23.45 kg/m  , BMI Body mass index is 23.45 kg/m. GENERAL:  Well appearing HEENT: Pupils equal round and reactive, fundi not visualized, oral  mucosa unremarkable NECK:  No jugular venous distention, waveform within normal limits, carotid upstroke brisk and symmetric, no bruits, no thyromegaly LYMPHATICS:  No cervical adenopathy LUNGS:  Clear to auscultation bilaterally HEART:  RRR.  PMI not displaced or sustained,S1 and S2 within normal limits, no S3, no S4, no clicks, no rubs, no murmurs ABD:  Flat, positive bowel sounds normal in frequency in pitch, no bruits, no rebound, no guarding, no midline pulsatile mass, no hepatomegaly, no splenomegaly EXT:  2 plus pulses throughout, no edema, no cyanosis no clubbing SKIN:  No rashes no nodules NEURO:  Cranial nerves II through XII grossly intact, motor grossly intact throughout PSYCH:  Cognitively intact, oriented to person place and time   ASSESSMENT/PLAN:    HTN - BP well controlled. Continue current antihypertensive regimen Amlodipine  10mg  daily, Toprol  25mg  daily, Valsartan  40mg  daily.  CAD s/p CABG / HLD< LDL goal <70- Stable with no anginal symptoms. No indication for ischemic evaluation.  GDMT aspirin , Metoprolol  25mg  daily, rosuvastatin  40mg  daily.  Deconditioning / DOE - Referred to physical therapy at [facility] for strengthening and conditioning.   Screening for Secondary Hypertension:     08/15/2020   10:31 AM  Causes  Drugs/Herbals Screened     - Comments no issues  Renovascular HTN Screened     - Comments check renal Dopplers  Sleep Apnea Screened     - Comments no triggers    Relevant Labs/Studies:    Latest Ref Rng & Units 02/28/2023    9:59 AM 07/23/2022    8:19 AM 04/30/2022   11:38 AM  Basic Labs  Sodium 134 - 144 mmol/L 142  140  140    Potassium 3.5 - 5.2 mmol/L 4.3  4.0  4.5   Creatinine 0.57 - 1.00 mg/dL 8.97  9.15  8.74        Latest Ref Rng & Units 12/31/2022   12:00 AM 07/30/2022   11:16 AM  Thyroid    TSH 0.450 - 4.500 uIU/mL 2.030  1.660                 01/07/2023    8:35 AM  Renovascular   Renal Artery US  Completed Yes    Disposition:    FU with MD/APP/PharmD in 4 months    Medication Adjustments/Labs and Tests Ordered: Current medicines are reviewed at length with the patient today.  Concerns regarding medicines are outlined above.  Orders Placed This Encounter  Procedures   Ambulatory referral to Physical Therapy   No orders of the defined types were placed in this encounter.    Signed, Reche GORMAN Finder, NP  04/24/2023 10:33 AM    [facility]

## 2023-04-25 NOTE — Telephone Encounter (Signed)
 Patient states that she has had this issue since March. She says that she is using flonase  and just started back on her Zyrtec 3 days ago. She said that it helps a little. She states that the cough is getting to her. PND, cough ear pain yellow phlegm but no fever. She has had these symptoms since 05/2022.

## 2023-04-25 NOTE — Telephone Encounter (Signed)
 Patient has an appt with Dr Gaynell Keeler Monday.

## 2023-04-28 ENCOUNTER — Encounter: Payer: Self-pay | Admitting: Pulmonary Disease

## 2023-04-28 ENCOUNTER — Ambulatory Visit (INDEPENDENT_AMBULATORY_CARE_PROVIDER_SITE_OTHER): Payer: Medicare Other | Admitting: Pulmonary Disease

## 2023-04-28 VITALS — BP 162/71 | HR 80 | Temp 97.4°F | Ht 62.0 in | Wt 127.6 lb

## 2023-04-28 DIAGNOSIS — D869 Sarcoidosis, unspecified: Secondary | ICD-10-CM | POA: Diagnosis not present

## 2023-04-28 MED ORDER — AZITHROMYCIN 250 MG PO TABS: ORAL_TABLET | ORAL | 0 refills | Status: DC

## 2023-04-28 MED ORDER — PREDNISONE 20 MG PO TABS: 20.0000 mg | ORAL_TABLET | Freq: Every day | ORAL | 0 refills | Status: DC

## 2023-04-28 NOTE — Patient Instructions (Signed)
 Prescription for azithromycin  Prescription for prednisone   Continue using your Breztri   Schedule for pulmonary function test to be done on the day of next visit  Schedule for CT scan to follow-up on scarring from sarcoidosis  Call us  with significant concerns  Follow-up in 3 months

## 2023-04-28 NOTE — Progress Notes (Signed)
 Kristen Suarez    [id number]    Nov 09, 1945  Primary Care Physician:Nche, Connye Delaine, NP  Referring Physician: Kandace Organ, NP [address] [place],  [place] [address]  Chief complaint:   Increased symptoms with congestion, cough  HPI:  History of sarcoidosis, obstructive lung disease, emphysema  More shortness of breath Increased mucus production Chest discomfort and congestion  She does have a history of severe obstructive lung disease, restrictive disease associated with sarcoidosis, abnormal CT scan showing significant scarring Mild obstructive sleep apnea-she feels she will not be able to tolerate CPAP History of CABG in 2023  She does use Breztri  regularly  Increased cough and congestion recently has had Episodes of needing steroids and antibiotics for exacerbation of symptoms  She feels symptoms are worse over the last 6 to 8 months  Concerned about allergies, uses Zyrtec   Outpatient Encounter Medications as of 04/28/2023  Medication Sig   acetaminophen  (TYLENOL ) 500 MG tablet Take 500 mg by mouth every 6 (six) hours as needed.   albuterol  (PROVENTIL ) (2.5 MG/3ML) 0.083% nebulizer solution TAKE 3 MLS (2.5 MG TOTAL) BY NEBULIZATION EVERY 4 (FOUR) HOURS AS NEEDED.   amLODipine  (NORVASC ) 10 MG tablet Take 1 tablet (10 mg total) by mouth daily.   aspirin  EC 81 MG tablet Take 1 tablet (81 mg total) by mouth daily. Swallow whole.   azithromycin  (ZITHROMAX  Z-PAK) 250 MG tablet Take 2 tablets day 1 and then 1 daily for 4 days   Budeson-Glycopyrrol-Formoterol  (BREZTRI  AEROSPHERE) 160-9-4.8 MCG/ACT AERO Inhale 2 puffs into the lungs in the morning and at bedtime.   CRANBERRY PO Take by mouth.   docusate sodium  (COLACE) 100 MG capsule Take 100 mg by mouth daily.    Evolocumab  (REPATHA  SURECLICK) 140 MG/ML SOAJ INJECT 140 MG INTO THE SKIN EVERY 14 (FOURTEEN) DAYS.   fluticasone  (FLONASE ) 50 MCG/ACT nasal spray SPRAY 2 SPRAYS INTO EACH NOSTRIL  EVERY DAY   MAGNESIUM  PO Take by mouth every other day.   metoprolol  succinate (TOPROL  XL) 25 MG 24 hr tablet Take 1 tablet (25 mg total) by mouth daily.   Multiple Vitamins-Minerals (MULTIVITAL PO) Take 1 tablet by mouth daily.    pantoprazole  (PROTONIX ) 40 MG tablet TAKE 1 TABLET BY MOUTH EVERY DAY   predniSONE  (DELTASONE ) 20 MG tablet Take 1 tablet (20 mg total) by mouth daily with breakfast.   Probiotic Product (PROBIOTIC DAILY PO) Take 1 tablet by mouth daily.    rosuvastatin  (CRESTOR ) 40 MG tablet TAKE 1 TABLET BY MOUTH EVERY DAY   sodium chloride  (OCEAN) 0.65 % SOLN nasal spray Place 1 spray into both nostrils as needed for congestion.   traZODone  (DESYREL ) 50 MG tablet TAKE 1/2 TABLET DAILY AT BEDTIME AS NEEDED   valsartan  (DIOVAN ) 40 MG tablet Take 1 tablet (40 mg total) by mouth daily.   No facility-administered encounter medications on file as of 04/28/2023.    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/1976    Past Surgical History:  Procedure Laterality Date   ABDOMINAL HYSTERECTOMY  1987   BASAL CELL CARCINOMA EXCISION  06/30/2020   nose   CARDIAC CATHETERIZATION     CLIPPING OF ATRIAL APPENDAGE N/A 01/04/2022   Procedure: CLIPPING OF ATRIAL APPENDAGE USING SIZE 40 ATRICURE ATRIAL CLIP;  Surgeon: Eleanora Grew, MD;  Location: [facility] OR;  Service: Open Heart Surgery;  Laterality: N/A;    CORONARY ARTERY BYPASS GRAFT N/A 01/04/2022   Procedure: CORONARY ARTERY BYPASS GRAFTING (CABG) x  USING ENDOSCOPIC GREATER SAPHENOUS VEIN HARVEST;  Surgeon: Eleanora Grew, MD;  Location: [facility] OR;  Service: Open Heart Surgery;  Laterality: N/A;   LEFT HEART CATH AND CORONARY ANGIOGRAPHY N/A 01/02/2022   Procedure: LEFT HEART CATH AND CORONARY ANGIOGRAPHY;  Surgeon: Millicent Ally, MD;  Location: [facility];  Service: Cardiovascular;  Laterality: N/A;   LUNG BIOPSY  06/16/1976   PERIPHERAL VASCULAR INTERVENTION Right 11/29/2020   Procedure: PERIPHERAL VASCULAR INTERVENTION;  Surgeon: Wenona Hamilton, MD;  Location: [facility];  Service: Cardiovascular;  Laterality: Right;  Renal Artery   RENAL ANGIOGRAPHY N/A 11/29/2020   Procedure: RENAL ANGIOGRAPHY;  Surgeon: Wenona Hamilton, MD;  Location: [facility];  Service: Cardiovascular;  Laterality: N/A;   TEE WITHOUT CARDIOVERSION N/A 01/04/2022   Procedure: TRANSESOPHAGEAL ECHOCARDIOGRAM (TEE);  Surgeon: Eleanora Grew, MD;  Location: [facility] OR;  Service: Open Heart Surgery;  Laterality: N/A;    Family History  Problem Relation Age of Onset   Arthritis Mother    Cancer Mother    Depression Mother    Heart attack Mother    Hyperlipidemia Mother    Hypertension Mother    Alcohol abuse Father    Heart disease Father    Hypertension Father    Hyperlipidemia Father    Heart disease Sister    Stroke Sister    Hyperlipidemia Sister    Alcohol abuse Sister    Hyperlipidemia Sister    Hypertension Sister    Alcohol abuse Brother    Heart disease Brother    Hypertension Brother    Hyperlipidemia Brother    Alcohol abuse Brother    Early death Brother    Heart attack Brother    Heart disease Brother    Hyperlipidemia Brother    Heart disease Maternal Grandfather    Heart disease Paternal Grandfather    Multiple sclerosis Daughter    Hyperlipidemia Son     Social History   Socioeconomic History   Marital  status: Married    Spouse name: Not on file   Number of children: 3   Years of education: 18   Highest education level: Master's degree (e.g., MA, MS, MEng, MEd, MSW, MBA)  Occupational History   Occupation: retired  Tobacco Use   Smoking status: Never    Passive exposure: Past   Smokeless tobacco: Never  Vaping Use   Vaping status: Never Used  Substance and Sexual Activity   Alcohol use: Not Currently    Comment: once in a while/rarely   Drug use: Never   Sexual activity: Not Currently  Other Topics Concern   Not on file  Social History Narrative   Right handed    No caffeine   One level   Retired Comptroller  Lives with husband   Social Drivers of Corporate investment banker Strain: Low Risk  (02/28/2023)   Overall Financial Resource Strain (CARDIA)    Difficulty of Paying Living Expenses: Not hard at all  Food Insecurity: No Food Insecurity (02/28/2023)   Hunger Vital Sign    Worried About Running Out of Food in the Last Year: Never true    Ran Out of Food in the Last Year: Never true  Transportation Needs: No Transportation Needs (02/28/2023)   PRAPARE - Administrator, Civil Service (Medical): No    Lack of Transportation (Non-Medical): No  Physical Activity: Sufficiently Active (02/28/2023)   Exercise Vital Sign    Days of Exercise per Week: 3 days    Minutes of Exercise per Session: 60 min  Stress: Stress Concern Present (02/28/2023)   [employer] of Occupational Health - Occupational Stress Questionnaire    Feeling of Stress : To some extent  Social Connections: Unknown (02/28/2023)   Social Connection and Isolation Panel [NHANES]    Frequency of Communication with Friends and Family: Three times a week    Frequency of Social Gatherings with Friends and Family: Patient declined    Attends Religious Services: Patient declined    Active Member of Clubs or Organizations: No    Attends Banker Meetings: Never    Marital Status:  Married  Catering manager Violence: Not At Risk (03/03/2023)   Humiliation, Afraid, Rape, and Kick questionnaire    Fear of Current or Ex-Partner: No    Emotionally Abused: No    Physically Abused: No    Sexually Abused: No    Review of Systems  HENT:  Positive for congestion.   Respiratory:  Positive for cough, chest tightness and shortness of breath.     Vitals:   04/28/23 0905 04/28/23 0907  BP: (!) 162/74 (!) 162/71  Pulse: 80   Temp: (!) 97.4 F (36.3 C)   SpO2: 96%      Physical Exam Constitutional:      Appearance: Normal appearance.  HENT:     Head: Normocephalic.     Mouth/Throat:     Mouth: Mucous membranes are moist.  Eyes:     General: No scleral icterus. Cardiovascular:     Rate and Rhythm: Normal rate and regular rhythm.     Heart sounds: No murmur heard.    No friction rub.  Pulmonary:     Effort: No respiratory distress.     Breath sounds: No stridor. No wheezing or rhonchi.  Musculoskeletal:     Cervical back: No rigidity or tenderness.  Neurological:     Mental Status: She is alert.  Psychiatric:        Mood and Affect: Mood normal.      Data Reviewed: CT scan of the chest from 2023 was reviewed with the patient  Pulmonary function test from 2022 was reviewed with the patient  Assessment:  History of emphysema/COPD  History of sarcoidosis  Exacerbation of symptoms with increased mucus and congestion    Plan/Recommendations:  Prescription for azithromycin  sent to pharmacy  Prescription for prednisone  to be used for about 7 days  Continue using Breztri  on a regular basis  Continue albuterol  as needed  Follow-up in about 3 months  Obtain a CT scan of the chest without contrast to follow-up on sarcoidosis, last 1 was about 2 years ago  Obtain a pulmonary function test to compare with previous last 1 done about 2 years ago  Call us  with significant concerns  Try to stay active  I spent 30 minutes dedicated to the care of  this patient on the date of this encounter to include previsit review of records, face-to-face time with the patient discussing conditions above, post visit ordering of testing,ordering medications,independentlyinterpreting results, clinical documentation with electronic health record and communicated necessary findings to members of the patient's care team   Myer Artis MD [facility] 04/28/2023, 9:33 AM  CC: Nche, Connye Delaine, NP

## 2023-05-01 ENCOUNTER — Encounter: Payer: Self-pay | Admitting: Nurse Practitioner

## 2023-05-01 ENCOUNTER — Ambulatory Visit
Admission: RE | Admit: 2023-05-01 | Discharge: 2023-05-01 | Disposition: A | Discharge status: Home / Self Care | Payer: Medicare Other | Source: Ambulatory Visit | Attending: Nurse Practitioner | Admitting: Nurse Practitioner

## 2023-05-01 DIAGNOSIS — M8589 Other specified disorders of bone density and structure, multiple sites: Secondary | ICD-10-CM

## 2023-05-04 IMAGING — DX DG KNEE AP/LAT W/ SUNRISE*R*
3 series · 3 of 3 positions shown · non-contrast
Comparison: No recent prior.

CLINICAL DATA: Right knee pain.  No known injury.

EXAM:
RIGHT KNEE 3 VIEWS

[knee ap]
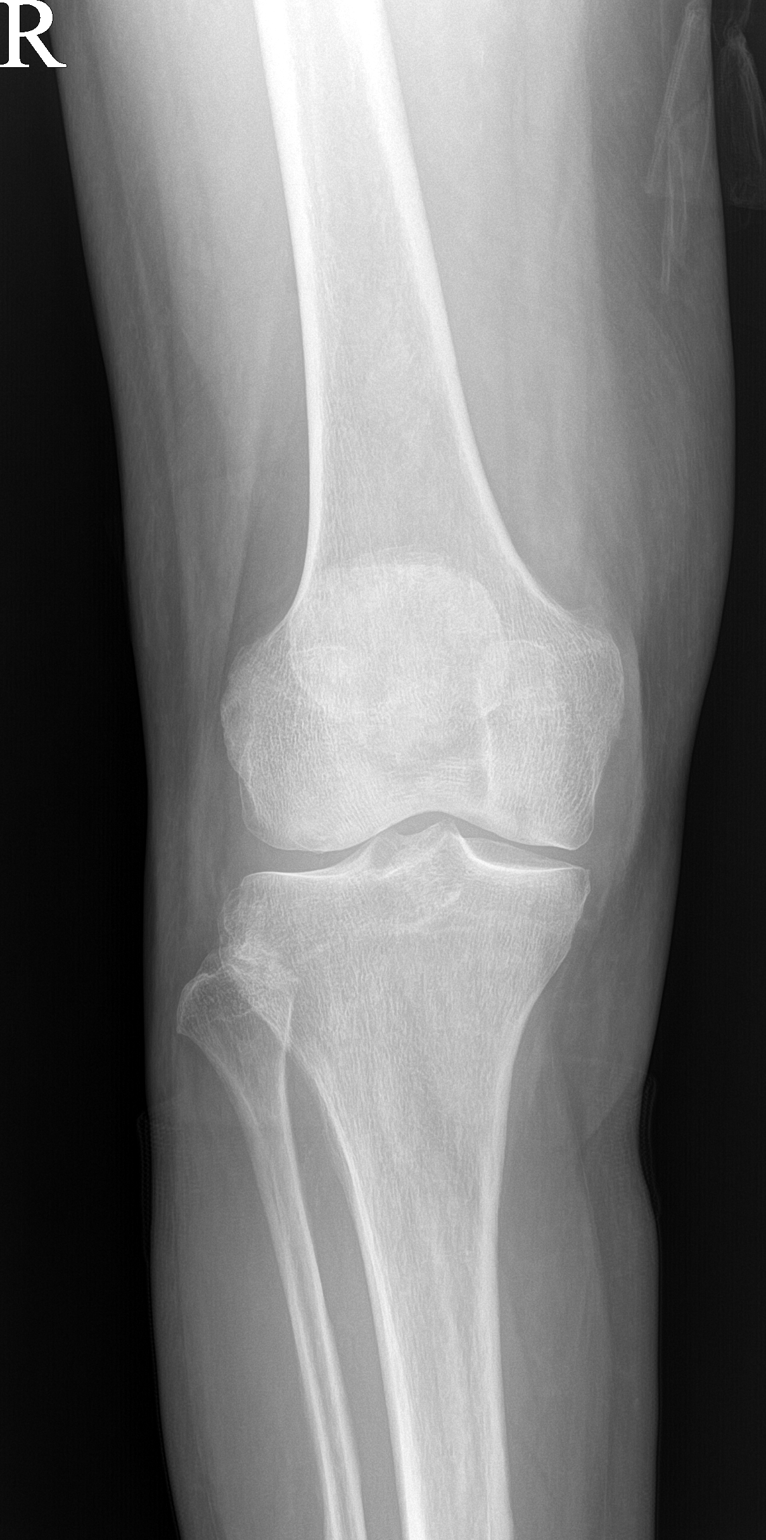

[knee lat]
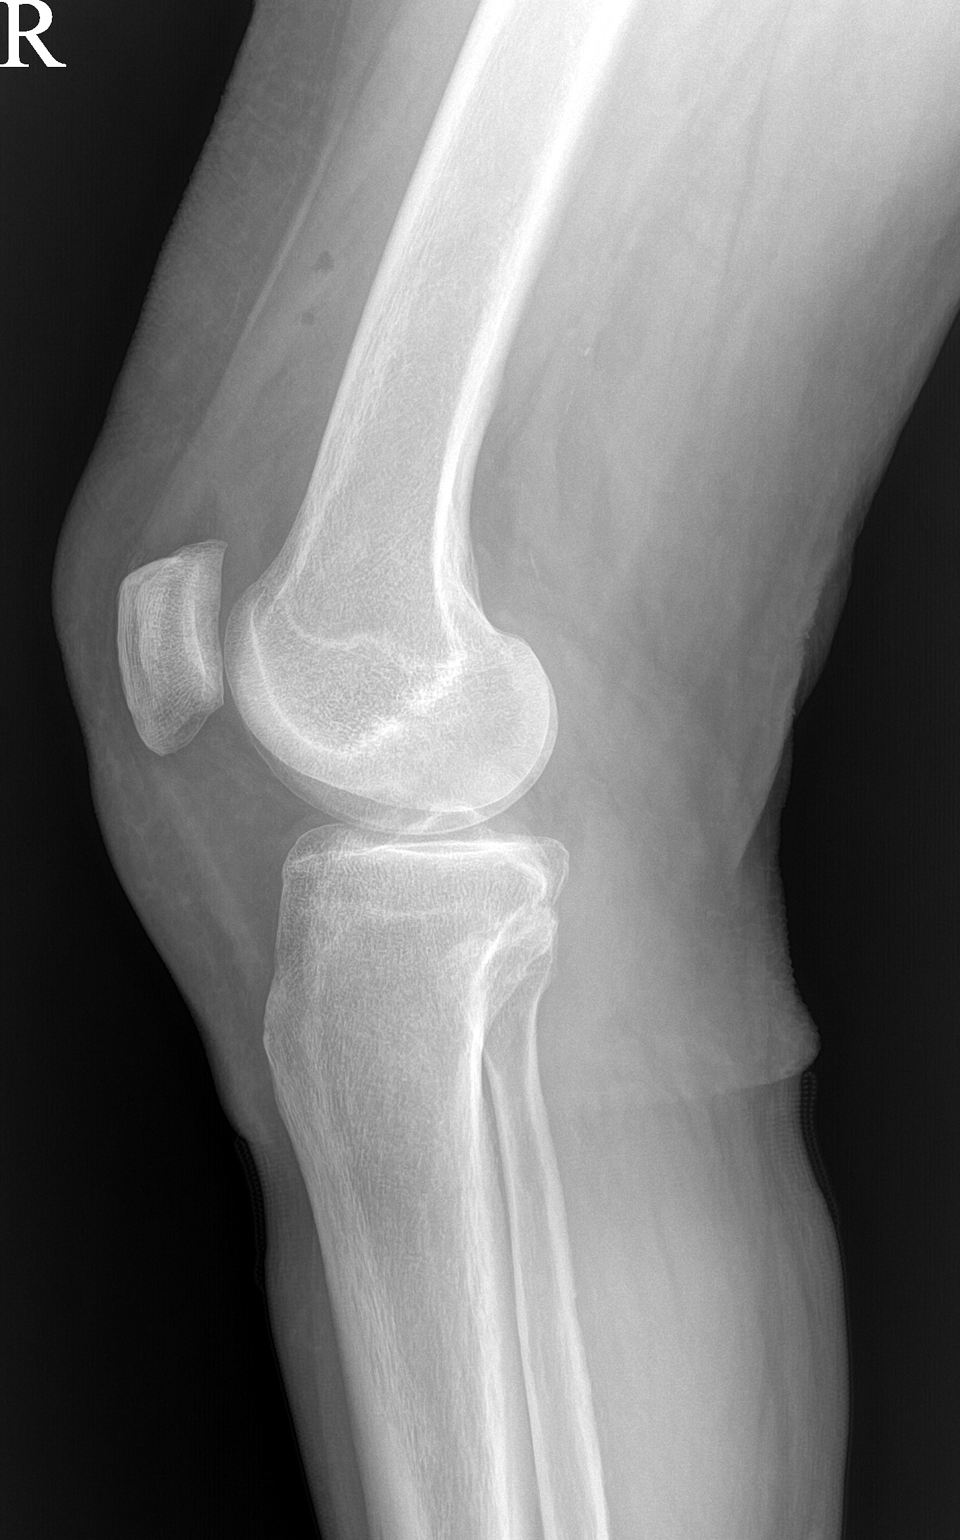

[patella]
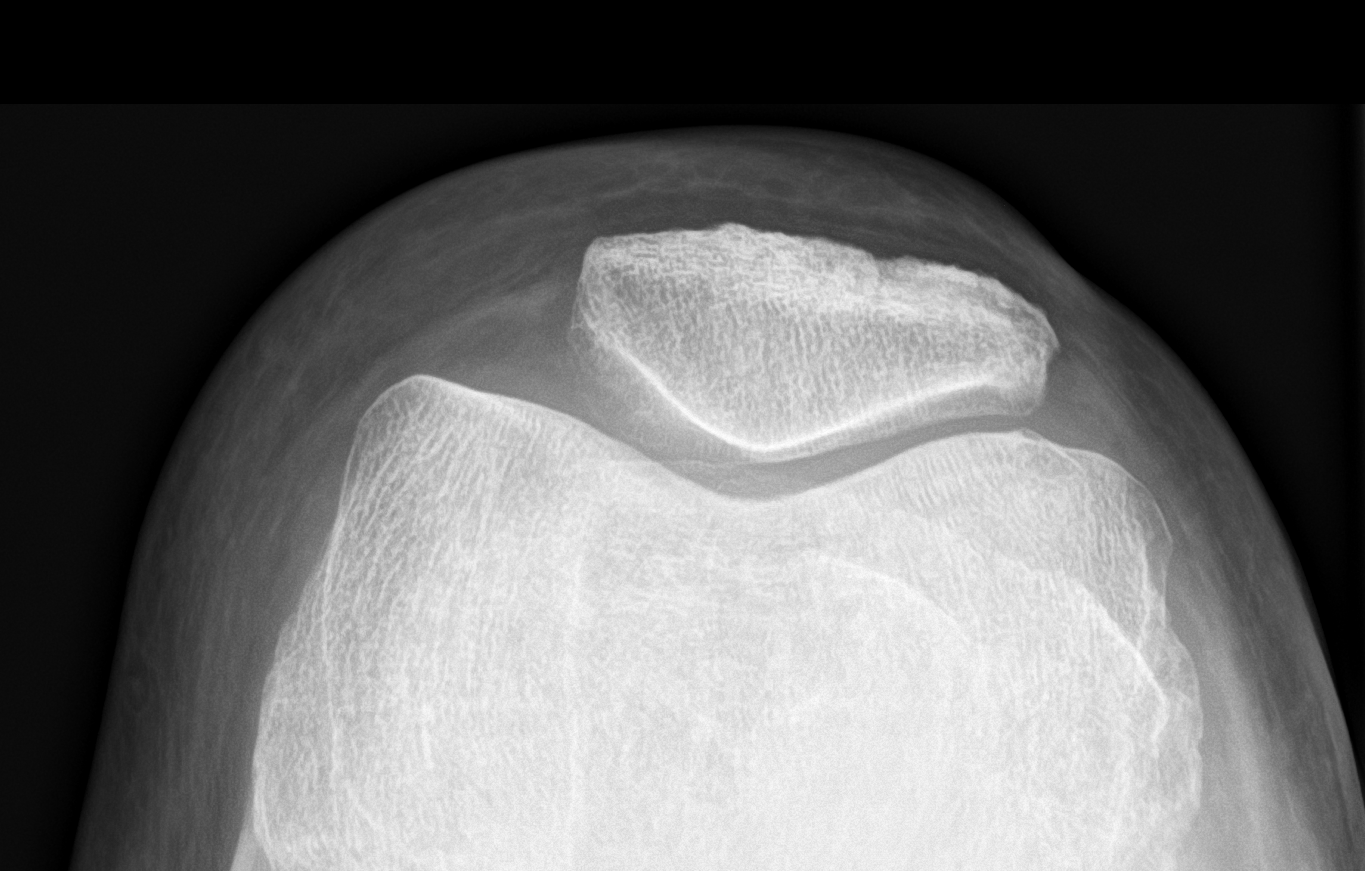

[3 of 3 positions shown; findings below may reference images not displayed]

FINDINGS: Mild patellofemoral medial compartment degenerative change. No acute
bony abnormality. No evidence of fracture dislocation. Small knee
joint effusion noted. Small amount of air noted in the suprapatellar
space, question has the patient had a recent injection, if not
infection is of concern. Peripheral vascular calcification.
IMPRESSION: 1. Mild patellofemoral and medial compartment degenerative change.
No acute bony abnormality.

2. Small knee joint effusion noted. Small amount of air noted in the
suprapatellar space, question has the patient had a recent
injection, if not infection is of concern.

3.  Peripheral vascular disease.

## 2023-05-05 ENCOUNTER — Encounter: Payer: Self-pay | Admitting: Nurse Practitioner

## 2023-05-05 DIAGNOSIS — M810 Age-related osteoporosis without current pathological fracture: Secondary | ICD-10-CM

## 2023-05-06 ENCOUNTER — Other Ambulatory Visit: Payer: Self-pay

## 2023-05-06 ENCOUNTER — Other Ambulatory Visit (HOSPITAL_COMMUNITY): Payer: Self-pay

## 2023-05-06 ENCOUNTER — Telehealth: Payer: Self-pay | Admitting: Nurse Practitioner

## 2023-05-06 MED ORDER — DENOSUMAB 60 MG/ML ~~LOC~~ SOSY: 60.0000 mg | PREFILLED_SYRINGE | SUBCUTANEOUS | 1 refills | Status: DC

## 2023-05-06 MED ORDER — DENOSUMAB 60 MG/ML ~~LOC~~ SOSY
60.0000 mg | PREFILLED_SYRINGE | SUBCUTANEOUS | 1 refills | Status: DC
  Filled 2023-05-06: qty 1, 180d supply, fill #0

## 2023-05-06 NOTE — Telephone Encounter (Signed)
 See my chart message

## 2023-05-06 NOTE — Telephone Encounter (Signed)
-----   Message from [facility] M sent at 05/05/2023  2:55 PM EST ----- Please see patient response below. Thanks, BMS/CMA ----- Message ----- From: Anne Ng, NP Sent: 05/05/2023   1:52 PM EST To: [facility] Hannon Team  Dexa scan indicates osteoporosis. I recommend use of prolia injection every 6months or monthly evenity injection x 12months. You also need to calcium and Vit. D supplement during this period. Which injection will you like to use?

## 2023-05-07 ENCOUNTER — Other Ambulatory Visit: Payer: Self-pay

## 2023-05-07 ENCOUNTER — Other Ambulatory Visit: Payer: Self-pay | Admitting: Emergency Medicine

## 2023-05-07 NOTE — Progress Notes (Signed)
Patient filling with [facility]. Dis-enrolling.

## 2023-05-12 NOTE — Telephone Encounter (Signed)
 Copied from CRM [id number]. Topic: General - Other >> May 12, 2023  2:36 PM Corin V wrote: Reason for CRM: Pharmacy calling to schedule delivery of medication for patient to the office. Delivery for Prolia will be 05/14/23 via [employer].

## 2023-05-13 NOTE — Progress Notes (Signed)
 Office Visit Note  Patient: Kristen Suarez             Date of Birth: 22-Jun-1945           MRN: [id number]             PCP: Anne Ng, NP Referring: Anne Ng, NP Visit Date: 05/27/2023 Occupation: @GUAROCC @  Subjective:  No chief complaint on file.   History of Present Illness: Kristen Suarez is a 78 y.o. female with sarcoidosis, osteoarthritis and osteopenia.  She was evaluated by Dr. Wynona Neat on April 28, 2023 for shortness of breath.  I reviewed the note from that visit.  He mentioned that she had exacerbation of emphysema and was treated with Z-Pak and prednisone.  She was also given some inhalers.  He obtained CT scan of the chest and ordered PFTs. IMPRESSION: *Stable extensive fibro atelectatic fibrocalcific changes of both lungs with significant bilateral bronchiectasis and scar changes involving particularly the upper and mid lung distributions with significant central pulmonary retraction and traction bronchiectasis and architectural distortion. These findings correlate with chronic interstitial lung disease and correlate with sarcoidosis stage IV. *No new significant findings. No acute infiltrates or consolidations. *Stable multiple calcified mediastinal and hilar lymph nodes.  Electronically Signed   By: Shaaron Adler M.D.   On: 05/26/2023 10:17 She recently saw Dr. Gracelyn Nurse for thoracic spine discomfort.  She was referred to physical therapy.  She is also starting cardio rehab.  Activities of Daily Living:  Patient reports morning stiffness for 30 minutes.   Patient Denies nocturnal pain.  Difficulty dressing/grooming: Denies Difficulty climbing stairs: Reports Difficulty getting out of chair: Reports Difficulty using hands for taps, buttons, cutlery, and/or writing: Reports  Review of Systems  Constitutional:  Positive for fatigue.  HENT:  Negative for mouth sores, mouth dryness and nose dryness.   Eyes:  Negative for pain and  dryness.  Respiratory:  Positive for shortness of breath and difficulty breathing.   Cardiovascular:  Positive for palpitations. Negative for chest pain.  Gastrointestinal:  Negative for blood in stool, constipation and diarrhea.  Endocrine: Negative for increased urination.  Genitourinary:  Negative for involuntary urination.  Musculoskeletal:  Positive for joint pain, gait problem, joint pain, myalgias, muscle weakness, morning stiffness, muscle tenderness and myalgias. Negative for joint swelling.  Skin:  Positive for sensitivity to sunlight. Negative for color change, rash and hair loss.  Allergic/Immunologic: Negative for susceptible to infections.  Neurological:  Negative for dizziness and headaches.  Hematological:  Negative for swollen glands.  Psychiatric/Behavioral:  Positive for sleep disturbance. Negative for depressed mood. The patient is nervous/anxious.     PMFS History:  Patient Active Problem List   Diagnosis Date Noted   Memory impairment 03/21/2023   Weight loss, unintentional 12/31/2022   Adjustment insomnia 08/19/2022   Nocturia more than twice per night 08/19/2022   Bilateral sensorineural hearing loss 06/27/2022   Hearing loss of right ear 02/04/2022   Hx of CABG 01/04/2022   Elevated coronary artery calcium score    Chronic kidney disease, stage 3b (HCC) 01/01/2022   Syncope 12/31/2021   Renal artery stenosis (HCC) 09/25/2021   Aortic valve sclerosis 09/25/2021   Snoring 04/13/2021   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   Epistaxis 03/29/2021   Coronary artery disease due to calcified coronary lesion 08/15/2020   Basal cell carcinoma 06/30/2020   History of loop recorder 12/13/2019   Allergic rhinitis 12/08/2019   Chronic cough 11/08/2019   Post-nasal drip 11/08/2019  Multiple joint pain 04/22/2019   Bilateral leg edema 04/22/2019   Vaginal atrophy 01/05/2019   Resting tremor 09/08/2018   Sarcoidosis 05/08/2018   HTN (hypertension) 05/08/2018   Colon  polyp 05/08/2018   Family hx of colon cancer 05/08/2018   Cystocele, midline 05/08/2018   Hyperlipidemia 05/08/2018   Vasovagal syncope 05/08/2018   Seborrheic keratosis 05/08/2018   Pseudophakia of both eyes 05/08/2018   GERD (gastroesophageal reflux disease) 05/08/2018   COPD (chronic obstructive pulmonary disease) (HCC) 05/08/2018   Osteoporosis 12/31/2016    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/1976    Family History  Problem Relation Age of Onset   Arthritis Mother    Cancer Mother    Depression Mother    Heart attack Mother    Hyperlipidemia Mother    Hypertension Mother    Alcohol abuse Father    Heart disease Father    Hypertension Father    Hyperlipidemia Father    Heart disease Sister    Stroke Sister    Hyperlipidemia Sister    Alcohol abuse Sister    Hyperlipidemia Sister    Hypertension Sister    Alcohol abuse Brother    Heart disease Brother    Hypertension Brother    Hyperlipidemia Brother    Alcohol abuse Brother    Early death Brother    Heart attack Brother    Heart disease Brother    Hyperlipidemia Brother    Heart disease Maternal Grandfather    Heart disease Paternal Grandfather    Multiple sclerosis Daughter    Hyperlipidemia Son    Past Surgical History:  Procedure Laterality Date   ABDOMINAL HYSTERECTOMY  1987   BASAL CELL CARCINOMA EXCISION  06/30/2020   nose   CARDIAC CATHETERIZATION     CLIPPING OF ATRIAL APPENDAGE N/A 01/04/2022   Procedure: CLIPPING OF ATRIAL APPENDAGE USING SIZE 40 ATRICURE ATRIAL CLIP;  Surgeon: Lyn Hollingshead,  MD;  Location: [facility] OR;  Service: Open Heart Surgery;  Laterality: N/A;   CORONARY ARTERY BYPASS GRAFT N/A 01/04/2022   Procedure: CORONARY ARTERY BYPASS GRAFTING (CABG) x  USING ENDOSCOPIC GREATER SAPHENOUS VEIN HARVEST;  Surgeon: Lyn Hollingshead, MD;  Location: [facility] OR;  Service: Open Heart Surgery;  Laterality: N/A;   LEFT HEART CATH AND CORONARY ANGIOGRAPHY N/A 01/02/2022   Procedure: LEFT HEART CATH AND CORONARY ANGIOGRAPHY;  Surgeon: Lennette Bihari, MD;  Location: [facility] INVASIVE CV LAB;  Service: Cardiovascular;  Laterality: N/A;   LUNG BIOPSY  06/16/1976   PERIPHERAL VASCULAR INTERVENTION Right 11/29/2020   Procedure: PERIPHERAL VASCULAR INTERVENTION;  Surgeon: Iran Ouch, MD;  Location: [facility]VAS[facility];  Service: Cardiovascular;  Laterality: Right;  Renal Artery   RENAL ANGIOGRAPHY N/A 11/29/2020   Procedure: RENAL ANGIOGRAPHY;  Surgeon: Iran Ouch, MD;  Location: [facility]AS[facility];  Service: Cardiovascular;  Laterality: N/A;   TEE WITHOUT CARDIOVERSION N/A 01/04/2022   Procedure: TRANSESOPHAGEAL ECHOCARDIOGRAM (TEE);  Surgeon: Lyn Hollingshead, MD;  Location: [facility]  OR;  Service: Open Heart Surgery;  Laterality: N/A;   Social History   Social History Narrative   Right handed    No caffeine   One level   Retired Teacher, 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   Immunization History  Administered Date(s) Administered   Fluad Quad(high Dose 65+) 11/19/2018, 12/22/2020, 11/30/2021   Fluad Trivalent(High Dose 65+) 12/13/2022   Influenza, High Dose Seasonal PF 01/05/2016, 12/12/2016, 12/11/2017   Influenza,inj,Quad PF,6+ Mos 12/11/2017   Influenza-Unspecified 11/24/2019, 11/24/2019   Moderna Covid-19 Fall Seasonal Vaccine 51yrs & older 08/06/2022   PFIZER Comirnaty(Gray Top)Covid-19 Tri-Sucrose Vaccine 06/27/2020   PFIZER(Purple Top)SARS-COV-2 Vaccination 05/14/2019, 06/08/2019, 12/20/2019, 08/09/2022   Pfizer Covid-19 Vaccine Bivalent Booster 76yrs & up 12/14/2020   Pfizer(Comirnaty)Fall  Seasonal Vaccine 12 years and older 12/23/2021, 12/13/2022   Pneumococcal Conjugate-13 03/23/2014   Pneumococcal Polysaccharide-23 03/24/2012, 01/22/2016   Respiratory Syncytial Virus Vaccine,Recomb Aduvanted(Arexvy) 11/30/2021   Tdap 03/24/2012, 12/02/2022   Zoster Recombinant(Shingrix) 07/31/2020, 01/29/2021     Objective: Vital Signs: BP (!) 173/78 (BP Location: Left Arm, Patient Position: Sitting, Cuff Size: Normal)   Pulse 73   Resp 16   Ht 5\' 2"  (1.575 m)   Wt 130 lb 3.2 oz (59.1 kg)   BMI 23.81 kg/m    Physical Exam Vitals and nursing note reviewed.  Constitutional:      Appearance: She is well-developed.  HENT:     Head: Normocephalic and atraumatic.  Eyes:     Conjunctiva/sclera: Conjunctivae normal.  Cardiovascular:     Rate and Rhythm: Normal rate and regular rhythm.     Heart sounds: Normal heart sounds.  Pulmonary:     Effort: Pulmonary effort is normal.     Breath sounds: Normal breath sounds.  Abdominal:     General: Bowel sounds are normal.     Palpations: Abdomen is soft.  Musculoskeletal:     Cervical back: Normal range of motion.  Lymphadenopathy:     Cervical: No cervical adenopathy.  Skin:    General: Skin is warm and dry.     Capillary Refill: Capillary refill takes less than 2 seconds.  Neurological:     Mental Status: She is alert and oriented to person, place, and time.  Psychiatric:        Behavior: Behavior normal.      Musculoskeletal Exam: Patient had limited lateral rotation of the cervical spine.  Thoracic kyphosis was noted.  Shoulder joints, elbow joints, wrist joints, MCPs PIPs and DIPs were in good range of motion.  She had bilateral PIP and DIP thickening with no synovitis.  Hip joints were in good range of motion.  Knee joints with good range of motion.  There was no tenderness over ankles or MTPs.  CDAI Exam: CDAI Score: -- Patient Global: --; Provider Global: -- Swollen: --; Tender: -- Joint Exam 05/27/2023   No joint exam  has been documented for this visit   There is currently no information documented on the homunculus. Go to the Rheumatology activity and complete the homunculus joint exam.  Investigation: No additional findings.  Imaging: CT Chest Wo Contrast Result Date: 05/26/2023 CLINICAL DATA:  COPD, exacerbation sarcoidosis EXAM: CT CHEST WITHOUT CONTRAST TECHNIQUE: Multidetector CT imaging of the chest was performed following the standard protocol without IV contrast. RADIATION DOSE REDUCTION: This exam was performed according to the departmental dose-optimization program which includes automated exposure control, adjustment of the mA and/or kV according to patient size and/or use of iterative reconstruction technique. COMPARISON:  Chest x-ray  Jul 23, 2022 CT cardiac November 20, 2021, CT chest December 29, 2020 FINDINGS: Cardiovascular: No significant vascular findings. Normal heart size. No pericardial effusion. Extensive coronary artery calcifications Mediastinum/Nodes: Stable multiple calcified mediastinal and hilar lymph nodes no new mediastinal masses or adenopathy. Lungs/Pleura: Comparison with prior examinations demonstrates no significant change in the extensive fibro atelectatic fibrocalcific changes of both lungs with significant bilateral bronchiectasis and scar changes involving particularly the upper and mid lung distributions with significant central pulmonary retraction and traction bronchiectasis and architectural distortion. These findings correlate with chronic interstitial lung disease and correlate with sarcoidosis stage IV. No new significant findings. No acute infiltrates or consolidations. Upper Abdomen: No acute abnormality. Musculoskeletal: No chest wall mass or suspicious bone lesions identified. IMPRESSION: *Stable extensive fibro atelectatic fibrocalcific changes of both lungs with significant bilateral bronchiectasis and scar changes involving particularly the upper and mid lung  distributions with significant central pulmonary retraction and traction bronchiectasis and architectural distortion. These findings correlate with chronic interstitial lung disease and correlate with sarcoidosis stage IV. *No new significant findings. No acute infiltrates or consolidations. *Stable multiple calcified mediastinal and hilar lymph nodes. Electronically Signed   By: Shaaron Adler M.D.   On: 05/26/2023 10:17   DG Bone Density Result Date: 05/01/2023 EXAM: DUAL X-RAY ABSORPTIOMETRY (DXA) FOR BONE MINERAL DENSITY IMPRESSION: Referring Physician:  Bonna Gains NCHE Your patient completed a bone mineral density test using GE Lunar iDXA system (analysis version: 16). Technologist: KAT PATIENT: Name: Kindell, Strada Patient ID: [id number] Birth Date: 1946-03-14 Height: 62.0 in. Sex: Female Measured: 05/01/2023 Weight: 125.8 lbs. Indications: Advanced Age, Albuterol, Caucasian, COPD, Estrogen Deficient, Height Loss (781.91), History of Osteopenia, Hysterectomy, Pantoprazole, Postmenopausal, Trazodone Fractures: None Treatments: Multivitamin ASSESSMENT: The BMD measured at Femur Neck Right is 0.688 g/cm2 with a T-score of -2.5. This patient is considered osteoporotic according to World Health Organization [facility]) criteria. The quality of the exam is good. L 1 was excluded due to degenerative changes. Site Region Measured Date Measured Age YA BMD Significant CHANGE T-score DualFemur Neck Right 5/01/2023    77.7         -2.5    0.688 g/cm2 DualFemur Neck Right 3/14/2020    74.6         -2.2    0.725 g/cm2 AP Spine L2-L4 05/01/2023 77.7 -1.8 0.983 g/cm2 * AP Spine  L2-L4      3/14/2020    74.6         -1.4    1.031 g/cm2 DualFemur Total Mean 05/01/2023 77.7 -2.2 0.735 g/cm2 * DualFemur Total Mean 3/14/2020    74.6         -1.2    0.852 g/cm2 World Health Organization [facility]) criteria for post-menopausal, Caucasian Women: Normal       T-score at or above -1 SD Osteopenia   T-score between -1 and -2.5 SD  Osteoporosis T-score at or below -2.5 SD RECOMMENDATION: 1. All patients should optimize calcium and vitamin D intake. 2. Consider FDA-approved medical therapies in postmenopausal women and men aged 74 years and older, based on the following: a. A hip or vertebral (clinical or morphometric) fracture. b. T-score = -2.5 at the femoral neck or spine after appropriate evaluation to exclude secondary causes. c. Low bone mass (T-score between -1.0 and -2.5 at the femoral neck or spine) and a 10-year probability of a hip fracture = 3% or a 10-year probability of a major osteoporosis-related fracture = 20% based on the [place]-adapted WHO algorithm. d. Clinician judgment  and/or patient preferences may indicate treatment for people with 10-year fracture probabilities above or below these levels. FOLLOW-UP: Patients with diagnosis of osteoporosis or at high risk for fracture should have regular bone mineral density tests.? Patients eligible for Medicare are allowed routine testing every 2 years.? The testing frequency can be increased to one year for patients who have rapidly progressing disease, are receiving or discontinuing medical therapy to restore bone mass, or have additional risk factors. I have reviewed this study and agree with the findings. [facility] Electronically Signed   By: Romona Curls M.D.   On: 05/01/2023 10:09    Recent Labs: Lab Results  Component Value Date   WBC 8.6 04/30/2022   HGB 12.5 04/30/2022   PLT 308 04/30/2022   NA 142 02/28/2023   K 4.3 02/28/2023   CL 101 02/28/2023   CO2 25 02/28/2023   GLUCOSE 106 (H) 02/28/2023   BUN 24 02/28/2023   CREATININE 1.02 (H) 02/28/2023   BILITOT 0.5 12/31/2022   ALKPHOS 97 12/31/2022   AST 25 12/31/2022   ALT 20 12/31/2022   PROT 7.4 12/31/2022   ALBUMIN 4.4 12/31/2022   CALCIUM 9.5 02/28/2023   GFRAA 57 (L) 05/05/2019     Speciality Comments: No specialty comments available.  Procedures:  No procedures  performed Allergies: Keflex [cephalexin], Penicillins, and Sulfa antibiotics   Assessment / Plan:     Visit Diagnoses: Sarcoidosis - patient was diagnosed with sarcoidosis at age 56 and was treated with prednisone for 15 years.patient is followed by Dr. Jamse Mead.  She was recently evaluated by Dr.Olalere for shortness of breath.  She had a CT scan which I reviewed with the patient.  It did not show any progression of sarcoidosis.  I would await recommendations from pulmonary if any changes need to be made.  Primary osteoarthritis of both hands-she continues to have pain and stiffness in her hands.  She had bilateral PIP and DIP thickening.  Joint protection was discussed.  A handout on hand exercises was given.  Primary osteoarthritis of both feet-she has intermittent discomfort in her feet.  Proper fitting shoes were advised.  Pain in thoracic spine - she has thoracic kyphosis.  X-ray of her thoracic spine which showed mild thoracic spondylosis and scoliosis on June 27, 2021 by Dr. Manson Passey.  Postural kyphosis of thoracic region-  Age-related osteoporosis without current pathological fracture - 05/01/23 T-score Dual Femur Neck Right 05/01/2023    T-score -2.5, BMD  0.688 g/cm2.  Patient states she was recently started on Prolia injections by her PCP.  Muscle spasm-she has intermittent muscle spasm.  Stretching exercises were discussed.  History of hyperlipidemia  HTN (hypertension), benign-pressure was elevated at 173/78.  She was advised to monitor blood pressure closely and follow-up with her PCP.  Hx of CABG-she continues to have some discomfort from the surgical scar.  She will be starting cardiac rehab.  History of gastroesophageal reflux (GERD)  Hx of colonic polyps  Chronic bronchitis, unspecified chronic bronchitis type (HCC)  Renal artery stenosis (HCC) - s/p stent placement 11/2021.  Resting tremor  Seborrheic keratosis  Pseudophakia of both eyes  Family history of  multiple sclerosis  Orders: No orders of the defined types were placed in this encounter.  No orders of the defined types were placed in this encounter.    Follow-Up Instructions: Return in about 6 months (around 11/27/2023) for Sarcoidosis, Osteoarthritis.   Pollyann Savoy, MD  Note - This record has been created using Dragon  software.  Chart creation errors have been sought, but may not always  have been located. Such creation errors do not reflect on  the standard of medical care.

## 2023-05-14 NOTE — Telephone Encounter (Signed)
 Pt Prolia arrived to the office.

## 2023-05-15 ENCOUNTER — Ambulatory Visit: Payer: Medicare Other

## 2023-05-15 DIAGNOSIS — M8589 Other specified disorders of bone density and structure, multiple sites: Secondary | ICD-10-CM

## 2023-05-15 MED ORDER — DENOSUMAB 60 MG/ML ~~LOC~~ SOSY
60.0000 mg | PREFILLED_SYRINGE | Freq: Once | SUBCUTANEOUS | Status: AC
Start: 2023-05-29 — End: 2023-05-15
  Administered 2023-05-15: 60 mg via SUBCUTANEOUS

## 2023-05-15 MED ORDER — DENOSUMAB 60 MG/ML ~~LOC~~ SOSY
60.0000 mg | PREFILLED_SYRINGE | Freq: Once | SUBCUTANEOUS | Status: AC
  Administered 2023-11-13: 60 mg via SUBCUTANEOUS

## 2023-05-15 NOTE — Progress Notes (Signed)
 Per orders of Nadene Rubins, MD, injection of Prolia given in the RT arm sq by [facility], cma.  Patient tolerated injection well.  She will return in 6 months for her next one. Dm/cma

## 2023-05-19 NOTE — Progress Notes (Unsigned)
   Rubin Payor, PhD, LAT, ATC acting as a scribe for Clementeen Graham, MD.  Kristen Suarez is a 78 y.o. female who presents to [facility] today for back pain. Pt was previously seen by Dr. Denyse Amass on 11/13/21 for chronic thoracic back pain.  Today, pt reports ***. Pt locates pain to ***  Radiating pain: LE numbness/tingling: LE weakness: Aggravates: Treatments tried:  Pertinent review of systems: ***  Relevant historical information: ***   Exam:  There were no vitals taken for this visit. General: Well Developed, well nourished, and in no acute distress.   MSK: ***    Lab and Radiology Results No results found for this or any previous visit (from the past 72 hours). No results found.     Assessment and Plan: 78 y.o. female with ***   PDMP not reviewed this encounter. No orders of the defined types were placed in this encounter.  No orders of the defined types were placed in this encounter.    Discussed warning signs or symptoms. Please see discharge instructions. Patient expresses understanding.   ***

## 2023-05-20 ENCOUNTER — Ambulatory Visit (INDEPENDENT_AMBULATORY_CARE_PROVIDER_SITE_OTHER)

## 2023-05-20 ENCOUNTER — Ambulatory Visit: Payer: Medicare Other | Admitting: Family Medicine

## 2023-05-20 ENCOUNTER — Encounter: Payer: Self-pay | Admitting: Family Medicine

## 2023-05-20 VITALS — BP 158/84 | HR 81 | Ht 62.0 in | Wt 129.0 lb

## 2023-05-20 DIAGNOSIS — G8929 Other chronic pain: Secondary | ICD-10-CM | POA: Diagnosis not present

## 2023-05-20 DIAGNOSIS — R413 Other amnesia: Secondary | ICD-10-CM | POA: Diagnosis not present

## 2023-05-20 DIAGNOSIS — M545 Low back pain, unspecified: Secondary | ICD-10-CM

## 2023-05-20 DIAGNOSIS — M546 Pain in thoracic spine: Secondary | ICD-10-CM

## 2023-05-20 NOTE — Therapy (Signed)
 OUTPATIENT PHYSICAL THERAPY LOWER EXTREMITY EVALUATION   Patient Name: Kristen Suarez MRN: [id number] DOB:September 25, 1945, 78 y.o., female Today's Date: 05/22/2023  END OF SESSION:  PT End of Session -  05/21/2023    Visit Number 1   Number of Visits 12   Date for PT Re-Evaluation 07/02/2023   Authorization Type MCR A and B   PT Start Time  1026   PT Stop Time 1112   PT Time Calculation (min) 46 min   Activity Tolerance Patient tolerated treatment well   Behavior During Therapy  [facility] for tasks assessed/performed     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/1976   Past Surgical History:  Procedure Laterality Date   ABDOMINAL HYSTERECTOMY  1987   BASAL CELL CARCINOMA EXCISION  06/30/2020   nose   CARDIAC CATHETERIZATION     CLIPPING OF ATRIAL APPENDAGE N/A 01/04/2022   Procedure: CLIPPING OF ATRIAL APPENDAGE USING SIZE 40 ATRICURE ATRIAL CLIP;  Surgeon: Lyn Hollingshead, MD;  Location: [facility] OR;  Service: Open Heart Surgery;  Laterality: N/A;   CORONARY ARTERY BYPASS GRAFT N/A 01/04/2022   Procedure: CORONARY ARTERY BYPASS GRAFTING (CABG) x  USING ENDOSCOPIC GREATER SAPHENOUS VEIN HARVEST;  Surgeon: Lyn Hollingshead, MD;  Location: [facility] OR;  Service: Open Heart Surgery;  Laterality: N/A;   LEFT HEART CATH AND CORONARY ANGIOGRAPHY N/A 01/02/2022   Procedure: LEFT HEART CATH AND CORONARY ANGIOGRAPHY;  Surgeon: Lennette Bihari, MD;  Location: [facility] INVASIVE CV LAB;  Service: Cardiovascular;  Laterality: N/A;   LUNG BIOPSY  06/16/1976   PERIPHERAL VASCULAR INTERVENTION Right 11/29/2020   Procedure:  PERIPHERAL VASCULAR INTERVENTION;  Surgeon: Iran Ouch, MD;  Location: [facility] INVASIVE CV LAB;  Service: Cardiovascular;  Laterality: Right;  Renal Artery   RENAL ANGIOGRAPHY N/A 11/29/2020   Procedure: RENAL ANGIOGRAPHY;  Surgeon: Iran Ouch, MD;  Location: [facility] INVASIVE CV LAB;  Service: Cardiovascular;  Laterality: N/A;   TEE WITHOUT CARDIOVERSION N/A 01/04/2022   Procedure: TRANSESOPHAGEAL ECHOCARDIOGRAM (TEE);  Surgeon: Lyn Hollingshead, MD;  Location: [facility] OR;  Service: Open Heart Surgery;  Laterality: N/A;   Patient Active Problem List   Diagnosis Date Noted   Memory impairment 03/21/2023   Weight loss, unintentional 12/31/2022   Adjustment insomnia 08/19/2022   Nocturia more than twice per night 08/19/2022   Bilateral sensorineural hearing loss 06/27/2022   Hearing loss of right ear 02/04/2022   Hx of CABG 01/04/2022   Elevated coronary artery calcium score    Chronic kidney disease, stage 3b (HCC) 01/01/2022   Syncope 12/31/2021   Renal artery stenosis (HCC) 09/25/2021   Aortic valve sclerosis 09/25/2021   Snoring 04/13/2021   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   Epistaxis 03/29/2021   Coronary artery disease due to calcified coronary lesion 08/15/2020   Basal cell carcinoma 06/30/2020   History of loop recorder 12/13/2019   Allergic rhinitis 12/08/2019   Chronic cough 11/08/2019   Post-nasal drip 11/08/2019   Multiple joint pain 04/22/2019  Bilateral leg edema 04/22/2019   Vaginal atrophy 01/05/2019   Resting tremor 09/08/2018   Sarcoidosis 05/08/2018   HTN (hypertension) 05/08/2018   Colon polyp 05/08/2018   Family hx of colon cancer 05/08/2018   Cystocele, midline 05/08/2018   Hyperlipidemia 05/08/2018   Vasovagal syncope 05/08/2018   Seborrheic keratosis 05/08/2018   Pseudophakia of both eyes 05/08/2018   GERD (gastroesophageal reflux disease) 05/08/2018   COPD (chronic obstructive pulmonary disease) (HCC) 05/08/2018   Osteoporosis 12/31/2016      REFERRING PROVIDER: Alver Sorrow, NP   REFERRING DIAG: R06.09 (ICD-10-CM) - Exertional dyspnea              R53.81 (ICD-10-CM) - Physical deconditioning  THERAPY DIAG:  Muscle weakness (generalized)  Difficulty in walking, not elsewhere classified  Rationale for Evaluation and Treatment: Rehabilitation  ONSET DATE: PT order 04/24/2023  SUBJECTIVE:   SUBJECTIVE STATEMENT: Pt states she is not making the progress they expected since the heart surgery.  Pt states she has not returned to the prior level she was prior to CABG in October 2023.  Pt has SOB and fatigue.  Pt states she is wobbly and can't walk straight.  Pt is limited with cleaning her home and has to take rest breaks.  Pt states she can't be active for > 20-25 mins without taking a rest break.  She has to take a rest after lunch though is not sleeping as she used to.  Pt is limited with ambulation distance.  Pt has performed pulmonary rehab in 2022 and feels like her SOB is like it was prior to pulmonary rehab.  MD note indicated PT at [facility] for strengthening and conditioning to increase stamina and help energy levels.    Pt saw Dr. Denyse Amass for thoracic and lumbar pain. She began having R scapular/thoracic in the fall of 2022 and L scapula/thoracic pain in 2023.  Her lower back pain began in Oct 2024.  Pt had lumbar x-rays and is having chest CT today.  MD note indicated chronic thoracic and chronic low back pain.  Pain due to degenerative changes and muscle dysfunction .  MD note indicated plan to proceed with PT.   Pt is a Management consultant and works out 3x/wk.  Pt uses the Nustep, recumbent bike, and octane.  Pt has had memory problems following her CABG surgery.  She is followed by neurology.  She states the tests didn't show anything so far from neurology.  Shed has a neuropsychological evaluation scheduled for November.   PERTINENT HISTORY: Chronic thoracic pain and lumbar pain CAD s/p CABG x 4 on  01/04/2022 Osteoporosis syncope, pulmonary sarcoidosis, implanted loop recorder Arthritis and HTN Memory problems since CABG surgery--she is being followed by neurology.    PAIN:   5-6/10 L scapular pain  0/10 R scapular pain 2/10 lumbar  PRECAUTIONS: Other: Implanted loop recorder, osteoporosis, balance, CABG, syncope    WEIGHT BEARING RESTRICTIONS: No  FALLS:  Has patient fallen in last 6 months? Yes. Number of falls 1 on 02/23/2023, syncope  LIVING ENVIRONMENT: Lives with: lives with their spouse Lives in:  1 story home Stairs: 2-4 steps to enter/exit home with rail Has following equipment at home: Single point cane, Walker - 4 wheeled, and walking sticks  OCCUPATION: Pt is retired  PLOF: Independent  PATIENT GOALS: to walk a straight line, to have improved activity tolerance after a short rest break, be more flexible     OBJECTIVE:  Note: Objective measures were completed at Evaluation  unless otherwise noted.  DIAGNOSTIC FINDINGS:  Per MD note, Lumbar X-rays showed DDD worse L5-S1.  No acute fractures are visible.   PATIENT SURVEYS:  LEFS 42/80  COGNITION: Overall cognitive status: Within functional limits for tasks assessed  Pt states she has memory problems and is being followed by neurology.      POSTURE:  FHP, Cervical in flexion   VITALS:  O2:  96-97% which she states is what she normally is        HR:  80   LOWER EXTREMITY MMT:  MMT Right eval Left eval  Hip flexion 4-/5 4/5  Hip extension    Hip abduction 18.5 18.9  Hip adduction    Hip internal rotation    Hip external rotation    Knee flexion 5/5 4+/5  Knee extension 4/5 4-5  Ankle dorsiflexion 5/5 5/5  Ankle plantarflexion    Ankle inversion    Ankle eversion     (Blank rows = not tested)   FUNCTIONAL TESTS:  5x STS test: 24.05 without UE support 3 MWT:  529 ft.   Pt felt SOB at 1 min 48 sec.  O2:  99%, HR:  104  GAIT: Comments: slow gait speed, decreased foot clearance  on L occasionally                                                                                                                                TREATMENT DATE:  See below for pt education    PATIENT EDUCATION:  Education details: objective findings and rationale of findings, dx, relevant anatomy, POC, rationale of interventions, and what to expect next Rx.  PT answered pt's questions.  Person educated: Patient Education method: Explanation Education comprehension: verbalized understanding  HOME EXERCISE PROGRAM: Will give next visit.   ASSESSMENT:  CLINICAL IMPRESSION: Patient is a 78 y.o. female with dx's of exertional dyspnea and physical deconditioning.  Pt underwent CABG x 4 in October 2023 and is not making the progress they expected.  Pt has SOB and fatigue with activities.  Pt is limited with cleaning her home and has to take rest breaks.  Pt states she can't be active for > 20-25 mins without taking a rest break.  Pt is limited with ambulation distance.  Pt has muscle weakness in bilat LE's.  It required increased time for pt to perform 5x STS test.  Pt was seen by Dr. Denyse Amass also for thoracic and lumbar pain and plans for pt to have PT.   Pt should benefit from skilled PT to address impairments and improve overall function.     OBJECTIVE IMPAIRMENTS: decreased activity tolerance, decreased balance, decreased endurance, decreased mobility, difficulty walking, decreased strength, and pain.   ACTIVITY LIMITATIONS: standing, squatting, transfers, and locomotion level  PARTICIPATION LIMITATIONS: cleaning, shopping, and community activity  PERSONAL FACTORS: 3+ comorbidities: osteoporosis, CABG, arthritis, syncope, pulmonary sarcoidosis  are also affecting patient's functional outcome.   REHAB POTENTIAL:  Good  CLINICAL DECISION MAKING: Stable/uncomplicated  EVALUATION COMPLEXITY: Low   GOALS:  SHORT TERM GOALS: Target date: 06/11/2023  Pt will be independent and compliant  with HEP for improved strength, tolerance to activity, functional endurance, pain, and mobility.   Baseline: Goal status: INITIAL  2.  Pt will report at least a 25% improvement in tolerance to activity and her functional mobility.  Baseline:  Goal status: INITIAL  3.  Pt will perform 5x STS test in no > than 19 sec for improved functional LE strength and performance of transfers.   Baseline:  Goal status: INITIAL  4.  Pt will ambulate entire 6 MWT test.   Baseline:  Goal status: INITIAL   LONG TERM GOALS: Target date: 07/02/2023  Pt will report at least a 70% improvement in her tolerance with daily activities and functional endurance with mobility.    Baseline:  Goal status: INITIAL  2.  Pt will increase ambulation distance by at least 150 ft on 3 MWT.  Baseline:  Goal status: INITIAL  3.  Pt will report increased ambulation distance with reduced fatigue and SOB. Baseline:  Goal status: INITIAL  4.  Pt will perform 5x STS test in < than 16 sec for improved functional LE strength and performance of transfers.  Baseline:  Goal status: INITIAL  5.  Pt will demo improved bilat LE strength to 4+/5 in hip flexion and 5/5 in knee extension and at least 6-8# increase in abd for improved performance of functional mobility and performance of household chores. Baseline:  Goal status: INITIAL    PLAN:  PT FREQUENCY: 2x/week  PT DURATION: 6 weeks  PLANNED INTERVENTIONS: 97164- PT Re-evaluation, 97110-Therapeutic exercises, 97530- Therapeutic activity, [id number]- Neuromuscular re-education, 97535- Self Care, [id number]- Manual therapy, [id number]- Gait training, [phone]- Aquatic Therapy, [phone]- Ultrasound, Patient/Family education, Balance training, Stair training, Taping, Dry Needling, Cryotherapy, and Moist heat  PLAN FOR NEXT SESSION:  Establish HEP.  Cont with LE strengthening, functional endurance, and functional mobility.  Monitor vitals.  Nu-step and sit to stands.  NO e-stim  Audie Clear III PT, DPT 05/23/23 12:16 PM

## 2023-05-20 NOTE — Patient Instructions (Addendum)
 Thank you for coming in today.  Please get an Xray today before you leave   Plan to proceed to physical therapy  Check back in 1 month

## 2023-05-21 ENCOUNTER — Ambulatory Visit (HOSPITAL_BASED_OUTPATIENT_CLINIC_OR_DEPARTMENT_OTHER): Payer: Medicare Other | Attending: Family | Admitting: Physical Therapy

## 2023-05-21 ENCOUNTER — Ambulatory Visit
Admission: RE | Admit: 2023-05-21 | Discharge: 2023-05-21 | Disposition: A | Discharge status: Home / Self Care | Payer: Medicare Other | Source: Ambulatory Visit | Attending: Pulmonary Disease | Admitting: Pulmonary Disease

## 2023-05-21 ENCOUNTER — Other Ambulatory Visit: Payer: Self-pay

## 2023-05-21 DIAGNOSIS — R0609 Other forms of dyspnea: Secondary | ICD-10-CM | POA: Diagnosis present

## 2023-05-21 DIAGNOSIS — D869 Sarcoidosis, unspecified: Secondary | ICD-10-CM

## 2023-05-21 DIAGNOSIS — M6281 Muscle weakness (generalized): Secondary | ICD-10-CM | POA: Diagnosis not present

## 2023-05-21 DIAGNOSIS — R5381 Other malaise: Secondary | ICD-10-CM | POA: Diagnosis present

## 2023-05-21 DIAGNOSIS — R262 Difficulty in walking, not elsewhere classified: Secondary | ICD-10-CM | POA: Diagnosis not present

## 2023-05-23 ENCOUNTER — Encounter (HOSPITAL_BASED_OUTPATIENT_CLINIC_OR_DEPARTMENT_OTHER): Payer: Self-pay | Admitting: Physical Therapy

## 2023-05-27 ENCOUNTER — Ambulatory Visit: Payer: Medicare Other | Attending: Rheumatology | Admitting: Rheumatology

## 2023-05-27 ENCOUNTER — Encounter: Payer: Self-pay | Admitting: Rheumatology

## 2023-05-27 ENCOUNTER — Other Ambulatory Visit (HOSPITAL_COMMUNITY): Payer: Self-pay

## 2023-05-27 VITALS — BP 173/78 | HR 73 | Resp 16 | Ht 62.0 in | Wt 130.2 lb

## 2023-05-27 DIAGNOSIS — M546 Pain in thoracic spine: Secondary | ICD-10-CM | POA: Insufficient documentation

## 2023-05-27 DIAGNOSIS — Z8719 Personal history of other diseases of the digestive system: Secondary | ICD-10-CM | POA: Diagnosis present

## 2023-05-27 DIAGNOSIS — Z860100 Personal history of colon polyps, unspecified: Secondary | ICD-10-CM | POA: Diagnosis present

## 2023-05-27 DIAGNOSIS — M19042 Primary osteoarthritis, left hand: Secondary | ICD-10-CM | POA: Diagnosis present

## 2023-05-27 DIAGNOSIS — J42 Unspecified chronic bronchitis: Secondary | ICD-10-CM | POA: Insufficient documentation

## 2023-05-27 DIAGNOSIS — M4004 Postural kyphosis, thoracic region: Secondary | ICD-10-CM | POA: Insufficient documentation

## 2023-05-27 DIAGNOSIS — M810 Age-related osteoporosis without current pathological fracture: Secondary | ICD-10-CM | POA: Insufficient documentation

## 2023-05-27 DIAGNOSIS — M19071 Primary osteoarthritis, right ankle and foot: Secondary | ICD-10-CM | POA: Diagnosis present

## 2023-05-27 DIAGNOSIS — M8589 Other specified disorders of bone density and structure, multiple sites: Secondary | ICD-10-CM

## 2023-05-27 DIAGNOSIS — D869 Sarcoidosis, unspecified: Secondary | ICD-10-CM | POA: Insufficient documentation

## 2023-05-27 DIAGNOSIS — G252 Other specified forms of tremor: Secondary | ICD-10-CM | POA: Diagnosis present

## 2023-05-27 DIAGNOSIS — M62838 Other muscle spasm: Secondary | ICD-10-CM | POA: Insufficient documentation

## 2023-05-27 DIAGNOSIS — L821 Other seborrheic keratosis: Secondary | ICD-10-CM | POA: Diagnosis present

## 2023-05-27 DIAGNOSIS — Z820 Family history of epilepsy and other diseases of the nervous system: Secondary | ICD-10-CM | POA: Insufficient documentation

## 2023-05-27 DIAGNOSIS — Z951 Presence of aortocoronary bypass graft: Secondary | ICD-10-CM | POA: Diagnosis present

## 2023-05-27 DIAGNOSIS — M19072 Primary osteoarthritis, left ankle and foot: Secondary | ICD-10-CM | POA: Insufficient documentation

## 2023-05-27 DIAGNOSIS — M19041 Primary osteoarthritis, right hand: Secondary | ICD-10-CM | POA: Insufficient documentation

## 2023-05-27 DIAGNOSIS — Z961 Presence of intraocular lens: Secondary | ICD-10-CM | POA: Insufficient documentation

## 2023-05-27 DIAGNOSIS — I701 Atherosclerosis of renal artery: Secondary | ICD-10-CM | POA: Diagnosis present

## 2023-05-27 DIAGNOSIS — I10 Essential (primary) hypertension: Secondary | ICD-10-CM | POA: Insufficient documentation

## 2023-05-27 DIAGNOSIS — Z8639 Personal history of other endocrine, nutritional and metabolic disease: Secondary | ICD-10-CM | POA: Insufficient documentation

## 2023-05-27 NOTE — Patient Instructions (Signed)

## 2023-05-28 NOTE — Therapy (Signed)
 OUTPATIENT PHYSICAL THERAPY TREATMENT   Patient Name: Kristen Suarez MRN: [id number] DOB:1945/09/15, 78 y.o., female Today's Date: 05/29/2023  END OF SESSION:  PT End of Session - 05/29/23 0935     Visit Number 2    Number of Visits 12    Date for PT Re-Evaluation 07/02/23    Authorization Type MCR A and B    PT Start Time 0935    PT Stop Time 1015    PT Time Calculation (min) 40 min    Activity Tolerance Patient tolerated treatment well             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/1976   Past Surgical History:  Procedure Laterality Date   ABDOMINAL HYSTERECTOMY  1987   BASAL CELL CARCINOMA EXCISION  06/30/2020   nose   CARDIAC CATHETERIZATION     CLIPPING OF ATRIAL APPENDAGE N/A 01/04/2022   Procedure: CLIPPING OF ATRIAL APPENDAGE USING SIZE 40 ATRICURE ATRIAL CLIP;  Surgeon: Lyn Hollingshead, MD;  Location: [facility] OR;  Service: Open Heart Surgery;  Laterality: N/A;   CORONARY ARTERY BYPASS GRAFT N/A 01/04/2022   Procedure: CORONARY ARTERY BYPASS GRAFTING (CABG) x  USING ENDOSCOPIC GREATER SAPHENOUS VEIN HARVEST;  Surgeon: Lyn Hollingshead, MD;  Location: [facility] OR;  Service: Open Heart Surgery;  Laterality: N/A;   LEFT HEART CATH AND CORONARY ANGIOGRAPHY N/A 01/02/2022   Procedure: LEFT HEART CATH AND CORONARY ANGIOGRAPHY;  Surgeon: Lennette Bihari, MD;  Location: [facility];  Service: Cardiovascular;  Laterality: N/A;   LUNG BIOPSY  06/16/1976   PERIPHERAL VASCULAR INTERVENTION Right 11/29/2020   Procedure: PERIPHERAL VASCULAR INTERVENTION;  Surgeon: Iran Ouch, MD;  Location: [facility];  Service: Cardiovascular;  Laterality: Right;  Renal Artery   RENAL ANGIOGRAPHY N/A 11/29/2020   Procedure: RENAL ANGIOGRAPHY;  Surgeon: Iran Ouch, MD;  Location: [facility];  Service: Cardiovascular;  Laterality: N/A;   TEE WITHOUT CARDIOVERSION N/A 01/04/2022   Procedure: TRANSESOPHAGEAL ECHOCARDIOGRAM (TEE);  Surgeon: Lyn Hollingshead, MD;  Location: [facility];  Service: Open Heart Surgery;  Laterality: N/A;   Patient Active Problem List   Diagnosis Date Noted   Memory impairment 03/21/2023   Weight loss, unintentional 12/31/2022   Adjustment insomnia 08/19/2022   Nocturia more than twice per night 08/19/2022   Bilateral sensorineural hearing loss 06/27/2022   Hearing loss of right ear 02/04/2022   Hx of CABG 01/04/2022   Elevated coronary artery calcium score    Chronic kidney disease, stage 3b (HCC) 01/01/2022   Syncope 12/31/2021   Renal artery stenosis (HCC) 09/25/2021   Aortic valve sclerosis 09/25/2021   Snoring 04/13/2021   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   Epistaxis 03/29/2021   Coronary artery disease due to calcified coronary lesion 08/15/2020   Basal cell carcinoma 06/30/2020   History of loop recorder 12/13/2019   Allergic rhinitis 12/08/2019   Chronic cough 11/08/2019   Post-nasal drip 11/08/2019   Multiple joint  pain 04/22/2019   Bilateral leg edema 04/22/2019   Vaginal atrophy 01/05/2019   Resting tremor 09/08/2018   Sarcoidosis 05/08/2018   HTN (hypertension) 05/08/2018   Colon polyp 05/08/2018   Family hx of colon cancer 05/08/2018   Cystocele, midline 05/08/2018   Hyperlipidemia 05/08/2018   Vasovagal syncope 05/08/2018   Seborrheic keratosis 05/08/2018   Pseudophakia of both eyes 05/08/2018   GERD (gastroesophageal reflux disease) 05/08/2018   COPD (chronic obstructive pulmonary disease) (HCC) 05/08/2018   Osteoporosis 12/31/2016     REFERRING PROVIDER: Alver Sorrow, NP   REFERRING  DIAG: R06.09 (ICD-10-CM) - Exertional dyspnea              R53.81 (ICD-10-CM) - Physical deconditioning  THERAPY DIAG:  Muscle weakness (generalized)  Difficulty in walking, not elsewhere classified  Rationale for Evaluation and Treatment: Rehabilitation  ONSET DATE: PT order 04/24/2023  SUBJECTIVE:   SUBJECTIVE STATEMENT: 05/29/2023 Pt states she has been doing okay since initial eval, no new updates, has been trying to increase activity at home.  Per eval - "Pt states she is not making the progress they expected since the heart surgery.  Pt states she has not returned to the prior level she was prior to CABG in October 2023.  Pt has SOB and fatigue.  Pt states she is wobbly and can't walk straight.  Pt is limited with cleaning her home and has to take rest breaks.  Pt states she can't be active for > 20-25 mins without taking a rest break.  She has to take a rest after lunch though is not sleeping as she used to.  Pt is limited with ambulation distance.  Pt has performed pulmonary rehab in 2022 and feels like her SOB is like it was prior to pulmonary rehab.  MD note indicated PT at [facility] for strengthening and conditioning to increase stamina and help energy levels.    Pt saw Dr. Denyse Amass for thoracic and lumbar pain. She began having R scapular/thoracic in the fall of 2022 and L scapula/thoracic pain in 2023.  Her lower back pain began in Oct 2024.  Pt had lumbar x-rays and is having chest CT today.  MD note indicated chronic thoracic and chronic low back pain.  Pain due to degenerative changes and muscle dysfunction .  MD note indicated plan to proceed with PT.   Pt is a Management consultant and works out 3x/wk.  Pt uses the Nustep, recumbent bike, and octane.  Pt has had memory problems following her CABG surgery.  She is followed by neurology.  She states the tests didn't show anything so far from neurology.  Shed has a neuropsychological evaluation scheduled for November. "  PERTINENT  HISTORY: Chronic thoracic pain and lumbar pain CAD s/p CABG x 4 on 01/04/2022 Osteoporosis syncope, pulmonary sarcoidosis, implanted loop recorder Arthritis and HTN Memory problems since CABG surgery--she is being followed by neurology.    PAIN:   5-6/10 L scapular pain  0/10 R scapular pain 2/10 lumbar  PRECAUTIONS: Other: Implanted loop recorder, osteoporosis, balance, CABG, syncope    WEIGHT BEARING RESTRICTIONS: No  FALLS:  Has patient fallen in last 6 months? Yes. Number of falls 1 on 02/23/2023, syncope  LIVING ENVIRONMENT: Lives with: lives with their spouse Lives in:  1 story home Stairs: 2-4 steps to enter/exit home with rail Has following equipment at home: Single point cane, Walker - 4 wheeled, and walking sticks  OCCUPATION: Pt is retired  PLOF: Independent  PATIENT GOALS: to  walk a straight line, to have improved activity tolerance after a short rest break, be more flexible     OBJECTIVE:  Note: Objective measures were completed at Evaluation unless otherwise noted.  DIAGNOSTIC FINDINGS:  Per MD note, Lumbar X-rays showed DDD worse L5-S1.  No acute fractures are visible.   PATIENT SURVEYS:  LEFS 42/80  COGNITION: Overall cognitive status: Within functional limits for tasks assessed  Pt states she has memory problems and is being followed by neurology.      POSTURE:  FHP, Cervical in flexion   VITALS:  O2:  96-97% which she states is what she normally is        HR:  80   LOWER EXTREMITY MMT:  MMT Right eval Left eval  Hip flexion 4-/5 4/5  Hip extension    Hip abduction 18.5 18.9  Hip adduction    Hip internal rotation    Hip external rotation    Knee flexion 5/5 4+/5  Knee extension 4/5 4-5  Ankle dorsiflexion 5/5 5/5  Ankle plantarflexion    Ankle inversion    Ankle eversion     (Blank rows = not tested)   FUNCTIONAL TESTS:  5x STS test: 24.05 without UE support 3 MWT:  529 ft.   Pt felt SOB at 1 min 48 sec.  O2:  99%, HR:   104  GAIT: Comments: slow gait speed, decreased foot clearance on L occasionally                                                                                                                                TREATMENT DATE:  [facility] Adult PT Treatment:                                                DATE: 05/28/23  Neuromuscular re-ed: Tandem stance 2x30sec BIL CGA-minA Fwd/retro walking 2x62ft CGA Lateral stepping 2x28ft CGA  Therapeutic Activity: 2x30sec ambulation w/ rest ambulation w/ 2 min rest Significant time spent w/ education on pacing of activities, use of Borg Dyspnea scale, strategies to monitor/modify activities safely    PATIENT EDUCATION:  Education details: rationale for interventions, HEP  Person educated: Patient Education method: Explanation, Demonstration, Tactile cues, Verbal cues Education comprehension: verbalized understanding, returned demonstration, verbal cues required, tactile cues required, and needs further education     HOME EXERCISE PROGRAM: Access Code: [email or web address] URL: [email or web address] Date: 05/29/2023 Prepared by: Fransisco Hertz  Exercises - Standing Tandem Balance with Counter Support  - 2-3 x daily - 1-2 sets - 1-2 reps - 30sec hold - Side Stepping with Counter Support  - 2-3 x daily - 1 sets - 8 reps - Backward Walking with Counter Support  - 2-3 x daily - 1 sets - 8 reps  ASSESSMENT:  CLINICAL IMPRESSION: 05/29/2023 Pt  arrives w/ report of continued activity tolerance deficits. Today we work on postural stability in static stance and  sagittal/frontal planes w/ CGA-minA to mitigate LOB, tolerates well. Also spending significant time w/ education/discussion re: strategies to monitor activity and modify appropriately for improved safety/tolerance. No adverse events, tolerates well with rest breaks, keeping activities within blue and lower green ranges on Borg scale. Recommend continuing along current POC in order to  address relevant deficits and improve functional tolerance. Pt departs today's session in no acute distress, all voiced questions/concerns addressed appropriately from PT perspective.    Per eval - Patient is a 78 y.o. female with dx's of exertional dyspnea and physical deconditioning.  Pt underwent CABG x 4 in October 2023 and is not making the progress they expected.  Pt has SOB and fatigue with activities.  Pt is limited with cleaning her home and has to take rest breaks.  Pt states she can't be active for > 20-25 mins without taking a rest break.  Pt is limited with ambulation distance.  Pt has muscle weakness in bilat LE's.  It required increased time for pt to perform 5x STS test.  Pt was seen by Dr. Denyse Amass also for thoracic and lumbar pain and plans for pt to have PT.   Pt should benefit from skilled PT to address impairments and improve overall function.     OBJECTIVE IMPAIRMENTS: decreased activity tolerance, decreased balance, decreased endurance, decreased mobility, difficulty walking, decreased strength, and pain.   ACTIVITY LIMITATIONS: standing, squatting, transfers, and locomotion level  PARTICIPATION LIMITATIONS: cleaning, shopping, and community activity  PERSONAL FACTORS: 3+ comorbidities: osteoporosis, CABG, arthritis, syncope, pulmonary sarcoidosis  are also affecting patient's functional outcome.   REHAB POTENTIAL: Good  CLINICAL DECISION MAKING: Stable/uncomplicated  EVALUATION COMPLEXITY: Low   GOALS:  SHORT TERM GOALS: Target date: 06/11/2023  Pt will be independent and compliant with HEP for improved strength, tolerance to activity, functional endurance, pain, and mobility.   Baseline: Goal status: INITIAL  2.  Pt will report at least a 25% improvement in tolerance to activity and her functional mobility.  Baseline:  Goal status: INITIAL  3.  Pt will perform 5x STS test in no > than 19 sec for improved functional LE strength and performance of transfers.    Baseline:  Goal status: INITIAL  4.  Pt will ambulate entire 6 MWT test.   Baseline:  Goal status: INITIAL   LONG TERM GOALS: Target date: 07/02/2023  Pt will report at least a 70% improvement in her tolerance with daily activities and functional endurance with mobility.    Baseline:  Goal status: INITIAL  2.  Pt will increase ambulation distance by at least 150 ft on 3 MWT.  Baseline:  Goal status: INITIAL  3.  Pt will report increased ambulation distance with reduced fatigue and SOB. Baseline:  Goal status: INITIAL  4.  Pt will perform 5x STS test in < than 16 sec for improved functional LE strength and performance of transfers.  Baseline:  Goal status: INITIAL  5.  Pt will demo improved bilat LE strength to 4+/5 in hip flexion and 5/5 in knee extension and at least 6-8# increase in abd for improved performance of functional mobility and performance of household chores. Baseline:  Goal status: INITIAL    PLAN:  PT FREQUENCY: 2x/week  PT DURATION: 6 weeks  PLANNED INTERVENTIONS: 97164- PT Re-evaluation, 97110-Therapeutic exercises, 97530- Therapeutic activity, [id number]- Neuromuscular re-education, 97535- Self Care, [id number]- Manual therapy, [id number]- Gait training, [phone]-  Aquatic Therapy, [id number]- Ultrasound, Patient/Family education, Balance training, Stair training, Taping, Dry Needling, Cryotherapy, and Moist heat  PLAN FOR NEXT SESSION:  review/update HEP PRN. Cont with LE strengthening, functional endurance, and functional mobility.  Monitor vitals.  Nu-step and sit to stands.  NO e-stim    Ashley Murrain PT, DPT 05/29/2023 10:30 AM

## 2023-05-29 ENCOUNTER — Encounter (HOSPITAL_BASED_OUTPATIENT_CLINIC_OR_DEPARTMENT_OTHER): Payer: Self-pay | Admitting: Physical Therapy

## 2023-05-29 ENCOUNTER — Ambulatory Visit (HOSPITAL_BASED_OUTPATIENT_CLINIC_OR_DEPARTMENT_OTHER): Admitting: Physical Therapy

## 2023-05-29 DIAGNOSIS — R262 Difficulty in walking, not elsewhere classified: Secondary | ICD-10-CM

## 2023-05-29 DIAGNOSIS — M6281 Muscle weakness (generalized): Secondary | ICD-10-CM

## 2023-05-29 DIAGNOSIS — R0609 Other forms of dyspnea: Secondary | ICD-10-CM | POA: Diagnosis not present

## 2023-06-05 NOTE — Therapy (Signed)
 OUTPATIENT PHYSICAL THERAPY TREATMENT   Patient Name: Kristen Suarez MRN: [id number] DOB:09-23-1945, 78 y.o., female Today's Date: 06/06/2023  END OF SESSION:  PT End of Session - 06/06/23 1346     Visit Number 3    Number of Visits 12    Date for PT Re-Evaluation 07/02/23    Authorization Type MCR A and B    Progress Note Due on Visit 10    PT Start Time 1346    PT Stop Time 1430    PT Time Calculation (min) 44 min    Activity Tolerance Patient tolerated treatment well              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/1976   Past Surgical History:  Procedure Laterality Date   ABDOMINAL HYSTERECTOMY  1987   BASAL CELL CARCINOMA EXCISION  06/30/2020   nose   CARDIAC CATHETERIZATION     CLIPPING OF ATRIAL APPENDAGE N/A 01/04/2022   Procedure: CLIPPING OF ATRIAL APPENDAGE USING SIZE 40 ATRICURE ATRIAL CLIP;  Surgeon: Lyn Hollingshead, MD;  Location: [facility] OR;  Service: Open Heart Surgery;  Laterality: N/A;   CORONARY ARTERY BYPASS GRAFT N/A 01/04/2022   Procedure: CORONARY ARTERY BYPASS GRAFTING (CABG) x  USING ENDOSCOPIC GREATER SAPHENOUS VEIN HARVEST;  Surgeon: Lyn Hollingshead, MD;  Location: [facility] OR;  Service: Open Heart Surgery;  Laterality: N/A;   LEFT HEART CATH AND CORONARY ANGIOGRAPHY N/A 01/02/2022   Procedure: LEFT HEART CATH AND CORONARY ANGIOGRAPHY;  Surgeon: Lennette Bihari, MD;  Location: [facility];  Service: Cardiovascular;  Laterality: N/A;   LUNG BIOPSY  06/16/1976   PERIPHERAL VASCULAR INTERVENTION Right 11/29/2020   Procedure: PERIPHERAL VASCULAR  INTERVENTION;  Surgeon: Iran Ouch, MD;  Location: [facility];  Service: Cardiovascular;  Laterality: Right;  Renal Artery   RENAL ANGIOGRAPHY N/A 11/29/2020   Procedure: RENAL ANGIOGRAPHY;  Surgeon: Iran Ouch, MD;  Location: [facility];  Service: Cardiovascular;  Laterality: N/A;   TEE WITHOUT CARDIOVERSION N/A 01/04/2022   Procedure: TRANSESOPHAGEAL ECHOCARDIOGRAM (TEE);  Surgeon: Lyn Hollingshead, MD;  Location: [facility] OR;  Service: Open Heart Surgery;  Laterality: N/A;   Patient Active Problem List   Diagnosis Date Noted   Memory impairment 03/21/2023   Weight loss, unintentional 12/31/2022   Adjustment insomnia 08/19/2022   Nocturia more than twice per night 08/19/2022   Bilateral sensorineural hearing loss 06/27/2022   Hearing loss of right ear 02/04/2022   Hx of CABG 01/04/2022   Elevated coronary artery calcium score    Chronic kidney disease, stage 3b (HCC) 01/01/2022   Syncope 12/31/2021   Renal artery stenosis (HCC) 09/25/2021   Aortic valve sclerosis 09/25/2021   Snoring 04/13/2021   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   Epistaxis 03/29/2021   Coronary artery disease due to calcified coronary lesion 08/15/2020   Basal cell carcinoma 06/30/2020   History of loop recorder 12/13/2019   Allergic rhinitis 12/08/2019   Chronic cough  11/08/2019   Post-nasal drip 11/08/2019   Multiple joint pain 04/22/2019   Bilateral leg edema 04/22/2019   Vaginal atrophy 01/05/2019   Resting tremor 09/08/2018   Sarcoidosis 05/08/2018   HTN (hypertension) 05/08/2018   Colon polyp 05/08/2018   Family hx of colon cancer 05/08/2018   Cystocele, midline 05/08/2018   Hyperlipidemia 05/08/2018   Vasovagal syncope 05/08/2018   Seborrheic keratosis 05/08/2018   Pseudophakia of both eyes 05/08/2018   GERD (gastroesophageal reflux disease) 05/08/2018   COPD (chronic obstructive pulmonary disease) (HCC) 05/08/2018   Osteoporosis 12/31/2016     REFERRING PROVIDER:  Alver Sorrow, NP   REFERRING DIAG: R06.09 (ICD-10-CM) - Exertional dyspnea              R53.81 (ICD-10-CM) - Physical deconditioning  THERAPY DIAG:  Muscle weakness (generalized)  Difficulty in walking, not elsewhere classified  Rationale for Evaluation and Treatment: Rehabilitation  ONSET DATE: PT order 04/24/2023  SUBJECTIVE:   SUBJECTIVE STATEMENT: 06/06/2023 Felt okay after last session. Tends to feel better in the mornings. Has continued trying to progress her activities, energy levels fluctuating day to day. Feeling more aches and pains today.    Per eval - "Pt states she is not making the progress they expected since the heart surgery.  Pt states she has not returned to the prior level she was prior to CABG in October 2023.  Pt has SOB and fatigue.  Pt states she is wobbly and can't walk straight.  Pt is limited with cleaning her home and has to take rest breaks.  Pt states she can't be active for > 20-25 mins without taking a rest break.  She has to take a rest after lunch though is not sleeping as she used to.  Pt is limited with ambulation distance.  Pt has performed pulmonary rehab in 2022 and feels like her SOB is like it was prior to pulmonary rehab.  MD note indicated PT at [facility] for strengthening and conditioning to increase stamina and help energy levels.    Pt saw Dr. Denyse Amass for thoracic and lumbar pain. She began having R scapular/thoracic in the fall of 2022 and L scapula/thoracic pain in 2023.  Her lower back pain began in Oct 2024.  Pt had lumbar x-rays and is having chest CT today.  MD note indicated chronic thoracic and chronic low back pain.  Pain due to degenerative changes and muscle dysfunction .  MD note indicated plan to proceed with PT.   Pt is a Management consultant and works out 3x/wk.  Pt uses the Nustep, recumbent bike, and octane.  Pt has had memory problems following her CABG surgery.  She is followed by neurology.  She states the tests didn't show  anything so far from neurology.  Shed has a neuropsychological evaluation scheduled for November. "  PERTINENT HISTORY: Chronic thoracic pain and lumbar pain CAD s/p CABG x 4 on 01/04/2022 Osteoporosis syncope, pulmonary sarcoidosis, implanted loop recorder Arthritis and HTN Memory problems since CABG surgery--she is being followed by neurology.    PAIN:   06/06/23: 4/10 L shoulder, low back   PRECAUTIONS: Other: Implanted loop recorder, osteoporosis, balance, CABG, syncope    WEIGHT BEARING RESTRICTIONS: No  FALLS:  Has patient fallen in last 6 months? Yes. Number of falls 1 on 02/23/2023, syncope  LIVING ENVIRONMENT: Lives with: lives with their spouse Lives in:  1 story home Stairs: 2-4 steps to enter/exit home with rail Has following equipment at home: Single point cane, Environmental consultant -  4 wheeled, and walking sticks  OCCUPATION: Pt is retired  PLOF: Independent  PATIENT GOALS: to walk a straight line, to have improved activity tolerance after a short rest break, be more flexible     OBJECTIVE:  Note: Objective measures were completed at Evaluation unless otherwise noted.  DIAGNOSTIC FINDINGS:  Per MD note, Lumbar X-rays showed DDD worse L5-S1.  No acute fractures are visible.   PATIENT SURVEYS:  LEFS 42/80  COGNITION: Overall cognitive status: Within functional limits for tasks assessed  Pt states she has memory problems and is being followed by neurology.      POSTURE:  FHP, Cervical in flexion   VITALS:  O2:  96-97% which she states is what she normally is        HR:  80   LOWER EXTREMITY MMT:  MMT Right eval Left eval  Hip flexion 4-/5 4/5  Hip extension    Hip abduction 18.5 18.9  Hip adduction    Hip internal rotation    Hip external rotation    Knee flexion 5/5 4+/5  Knee extension 4/5 4-5  Ankle dorsiflexion 5/5 5/5  Ankle plantarflexion    Ankle inversion    Ankle eversion     (Blank rows = not tested)   FUNCTIONAL TESTS:  5x STS  test: 24.05 without UE support 3 MWT:  529 ft.   Pt felt SOB at 1 min 48 sec.  O2:  99%, HR:  104  GAIT: Comments: slow gait speed, decreased foot clearance on L occasionally                                                                                                                                TREATMENT DATE:  [facility] Adult PT Treatment:                                                DATE: 06/06/23 Neuromuscular re-ed: Tandem stance 3x30sec BIL CGA Slow karaokes along mat 2 laps w/ seated rest Requires seated rest between activities given exertional fatigue  Therapeutic Activity: 2x45sec ambulation w/ rest Continued education/discussion re: activity modification, pacing, use of RPE scale   [facility] Adult PT Treatment:                                                DATE: 05/28/23  Neuromuscular re-ed: Tandem stance 2x30sec BIL CGA-minA Fwd/retro walking 2x53ft CGA Lateral stepping 2x107ft CGA  Therapeutic Activity: 2x30sec ambulation w/ rest ambulation w/ 2 min rest Significant time spent w/ education on pacing of activities, use of Borg Dyspnea scale, strategies to monitor/modify activities safely    PATIENT EDUCATION:  Education details:  rationale for interventions, HEP  Person educated: Patient Education method: Explanation, Demonstration, Tactile cues, Verbal cues Education comprehension: verbalized understanding, returned demonstration, verbal cues required, tactile cues required, and needs further education     HOME EXERCISE PROGRAM: Access Code: [email or web address] URL: [email or web address] Date: 05/29/2023 Prepared by: Fransisco Hertz  Exercises - Standing Tandem Balance with Counter Support  - 2-3 x daily - 1-2 sets - 1-2 reps - 30sec hold - Side Stepping with Counter Support  - 2-3 x daily - 1 sets - 8 reps - Backward Walking with Counter Support  - 2-3 x daily - 1 sets - 8 reps  ASSESSMENT:  CLINICAL IMPRESSION: 06/06/2023 Pt arrives w/  report of continued activity tolerance deficits. Today progressing complexity of balance training which she does well, improving performance w/ tandem stance today with no more than CGA required, increased volume although does require rest breaks due to exertional fatigue. Continuing to spend time during rest breaks educating on pacing of activities, activity modification, RPE monitoring/journaling and use of borg dyspnea scale. Also discussed incorporating paced walking at home (20-30sec bouts) to gradually build activity tolerance and pacing. Tolerates session well with rest breaks, no inc in pain and no adverse events. Recommend continuing along current POC in order to address relevant deficits and improve functional tolerance. Pt departs today's session in no acute distress, all voiced questions/concerns addressed appropriately from PT perspective.     Per eval - Patient is a 78 y.o. female with dx's of exertional dyspnea and physical deconditioning.  Pt underwent CABG x 4 in October 2023 and is not making the progress they expected.  Pt has SOB and fatigue with activities.  Pt is limited with cleaning her home and has to take rest breaks.  Pt states she can't be active for > 20-25 mins without taking a rest break.  Pt is limited with ambulation distance.  Pt has muscle weakness in bilat LE's.  It required increased time for pt to perform 5x STS test.  Pt was seen by Dr. Denyse Amass also for thoracic and lumbar pain and plans for pt to have PT.   Pt should benefit from skilled PT to address impairments and improve overall function.     OBJECTIVE IMPAIRMENTS: decreased activity tolerance, decreased balance, decreased endurance, decreased mobility, difficulty walking, decreased strength, and pain.   ACTIVITY LIMITATIONS: standing, squatting, transfers, and locomotion level  PARTICIPATION LIMITATIONS: cleaning, shopping, and community activity  PERSONAL FACTORS: 3+ comorbidities: osteoporosis, CABG,  arthritis, syncope, pulmonary sarcoidosis  are also affecting patient's functional outcome.   REHAB POTENTIAL: Good  CLINICAL DECISION MAKING: Stable/uncomplicated  EVALUATION COMPLEXITY: Low   GOALS:  SHORT TERM GOALS: Target date: 06/11/2023  Pt will be independent and compliant with HEP for improved strength, tolerance to activity, functional endurance, pain, and mobility.   Baseline: Goal status: INITIAL  2.  Pt will report at least a 25% improvement in tolerance to activity and her functional mobility.  Baseline:  Goal status: INITIAL  3.  Pt will perform 5x STS test in no > than 19 sec for improved functional LE strength and performance of transfers.   Baseline:  Goal status: INITIAL  4.  Pt will ambulate entire 6 MWT test.   Baseline:  Goal status: INITIAL   LONG TERM GOALS: Target date: 07/02/2023  Pt will report at least a 70% improvement in her tolerance with daily activities and functional endurance with mobility.    Baseline:  Goal status: INITIAL  2.  Pt will increase  ambulation distance by at least 150 ft on 3 MWT.  Baseline:  Goal status: INITIAL  3.  Pt will report increased ambulation distance with reduced fatigue and SOB. Baseline:  Goal status: INITIAL  4.  Pt will perform 5x STS test in < than 16 sec for improved functional LE strength and performance of transfers.  Baseline:  Goal status: INITIAL  5.  Pt will demo improved bilat LE strength to 4+/5 in hip flexion and 5/5 in knee extension and at least 6-8# increase in abd for improved performance of functional mobility and performance of household chores. Baseline:  Goal status: INITIAL    PLAN:  PT FREQUENCY: 2x/week  PT DURATION: 6 weeks  PLANNED INTERVENTIONS: 97164- PT Re-evaluation, 97110-Therapeutic exercises, 97530- Therapeutic activity, [id number]- Neuromuscular re-education, 97535- Self Care, [id number]- Manual therapy, [id number]- Gait training, [phone]- Aquatic Therapy, [phone]- Ultrasound,  Patient/Family education, Balance training, Stair training, Taping, Dry Needling, Cryotherapy, and Moist heat  PLAN FOR NEXT SESSION:  review/update HEP PRN. Recommend STS practice, LE strengthening. Continuing with balance. Monitoring activity tolerance via borg dyspnea scale and/or vitals. NO e-stim    Ashley Murrain PT, DPT 06/06/2023 2:38 PM

## 2023-06-06 ENCOUNTER — Ambulatory Visit (HOSPITAL_BASED_OUTPATIENT_CLINIC_OR_DEPARTMENT_OTHER): Admitting: Physical Therapy

## 2023-06-06 ENCOUNTER — Encounter (HOSPITAL_BASED_OUTPATIENT_CLINIC_OR_DEPARTMENT_OTHER): Payer: Self-pay | Admitting: Physical Therapy

## 2023-06-06 DIAGNOSIS — M6281 Muscle weakness (generalized): Secondary | ICD-10-CM

## 2023-06-06 DIAGNOSIS — R0609 Other forms of dyspnea: Secondary | ICD-10-CM | POA: Diagnosis not present

## 2023-06-06 DIAGNOSIS — R262 Difficulty in walking, not elsewhere classified: Secondary | ICD-10-CM

## 2023-06-09 ENCOUNTER — Telehealth: Payer: Self-pay | Admitting: Pharmacy Technician

## 2023-06-09 ENCOUNTER — Other Ambulatory Visit (HOSPITAL_COMMUNITY): Payer: Self-pay

## 2023-06-09 ENCOUNTER — Encounter: Payer: Self-pay | Admitting: Family Medicine

## 2023-06-09 NOTE — Progress Notes (Signed)
 Low back x-ray shows some arthritis at the base of the spine.

## 2023-06-09 NOTE — Telephone Encounter (Signed)
 Pharmacy Patient Advocate Encounter   Received notification from CoverMyMeds that prior authorization for Repatha is required/requested.   Insurance verification completed.   The patient is insured through [employer] .   Per test claim: PA required; PA submitted to above mentioned insurance via CoverMyMeds Key/confirmation #/EOC [id number] Status is pending

## 2023-06-09 NOTE — Telephone Encounter (Signed)
 Pharmacy Patient Advocate Encounter  Received notification from [employer] that Prior Authorization for Repatha has been APPROVED from 03/19/23 to 06/08/24. Unable to obtain price due to refill too soon rejection, last fill date 06/09/23 next available fill date05/26/24   PA #/Case ID/Reference #: [id number]

## 2023-06-11 NOTE — Therapy (Signed)
 OUTPATIENT PHYSICAL THERAPY TREATMENT   Patient Name: Kristen Suarez MRN: [id number] DOB:June 08, 1945, 78 y.o., female Today's Date: 06/12/2023  END OF SESSION:  PT End of Session - 06/12/23 0752     Visit Number 4    Number of Visits 12    Date for PT Re-Evaluation 07/02/23    Authorization Type MCR A and B    Progress Note Due on Visit 10    PT Start Time 0755    PT Stop Time 0840    PT Time Calculation (min) 45 min    Activity Tolerance Patient tolerated treatment well               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/1976   Past Surgical History:  Procedure Laterality Date   ABDOMINAL HYSTERECTOMY  1987   BASAL CELL CARCINOMA EXCISION  06/30/2020   nose   CARDIAC CATHETERIZATION     CLIPPING OF ATRIAL APPENDAGE N/A 01/04/2022   Procedure: CLIPPING OF ATRIAL APPENDAGE USING SIZE 40 ATRICURE ATRIAL CLIP;  Surgeon: Lyn Hollingshead, MD;  Location: [facility] OR;  Service: Open Heart Surgery;  Laterality: N/A;   CORONARY ARTERY BYPASS GRAFT N/A 01/04/2022   Procedure: CORONARY ARTERY BYPASS GRAFTING (CABG) x  USING ENDOSCOPIC GREATER SAPHENOUS VEIN HARVEST;  Surgeon: Lyn Hollingshead, MD;  Location: [facility] OR;  Service: Open Heart Surgery;  Laterality: N/A;   LEFT HEART CATH AND CORONARY ANGIOGRAPHY N/A 01/02/2022   Procedure: LEFT HEART CATH AND CORONARY ANGIOGRAPHY;  Surgeon: Lennette Bihari, MD;  Location: [facility];  Service: Cardiovascular;  Laterality: N/A;   LUNG BIOPSY  06/16/1976   PERIPHERAL VASCULAR INTERVENTION Right 11/29/2020   Procedure: PERIPHERAL VASCULAR  INTERVENTION;  Surgeon: Iran Ouch, MD;  Location: [facility];  Service: Cardiovascular;  Laterality: Right;  Renal Artery   RENAL ANGIOGRAPHY N/A 11/29/2020   Procedure: RENAL ANGIOGRAPHY;  Surgeon: Iran Ouch, MD;  Location: [facility];  Service: Cardiovascular;  Laterality: N/A;   TEE WITHOUT CARDIOVERSION N/A 01/04/2022   Procedure: TRANSESOPHAGEAL ECHOCARDIOGRAM (TEE);  Surgeon: Lyn Hollingshead, MD;  Location: [facility] OR;  Service: Open Heart Surgery;  Laterality: N/A;   Patient Active Problem List   Diagnosis Date Noted   Memory impairment 03/21/2023   Weight loss, unintentional 12/31/2022   Adjustment insomnia 08/19/2022   Nocturia more than twice per night 08/19/2022   Bilateral sensorineural hearing loss 06/27/2022   Hearing loss of right ear 02/04/2022   Hx of CABG 01/04/2022   Elevated coronary artery calcium score    Chronic kidney disease, stage 3b (HCC) 01/01/2022   Syncope 12/31/2021   Renal artery stenosis (HCC) 09/25/2021   Aortic valve sclerosis 09/25/2021   Snoring 04/13/2021   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   Epistaxis 03/29/2021   Coronary artery disease due to calcified coronary lesion 08/15/2020   Basal cell carcinoma 06/30/2020   History of loop recorder 12/13/2019   Allergic rhinitis 12/08/2019   Chronic  cough 11/08/2019   Post-nasal drip 11/08/2019   Multiple joint pain 04/22/2019   Bilateral leg edema 04/22/2019   Vaginal atrophy 01/05/2019   Resting tremor 09/08/2018   Sarcoidosis 05/08/2018   HTN (hypertension) 05/08/2018   Colon polyp 05/08/2018   Family hx of colon cancer 05/08/2018   Cystocele, midline 05/08/2018   Hyperlipidemia 05/08/2018   Vasovagal syncope 05/08/2018   Seborrheic keratosis 05/08/2018   Pseudophakia of both eyes 05/08/2018   GERD (gastroesophageal reflux disease) 05/08/2018   COPD (chronic obstructive pulmonary disease) (HCC) 05/08/2018   Osteoporosis 12/31/2016     REFERRING PROVIDER:  Alver Sorrow, NP   REFERRING DIAG: R06.09 (ICD-10-CM) - Exertional dyspnea              R53.81 (ICD-10-CM) - Physical deconditioning  THERAPY DIAG:  Muscle weakness (generalized)  Difficulty in walking, not elsewhere classified  Rationale for Evaluation and Treatment: Rehabilitation  ONSET DATE: PT order 04/24/2023  SUBJECTIVE:   SUBJECTIVE STATEMENT: 06/12/2023 Pt states she did okay after last session, continues to have some aches and pains. Had a lot of back pain Monday that improved w/ massage on tuesday. Has done some of her HEP.     Per eval - "Pt states she is not making the progress they expected since the heart surgery.  Pt states she has not returned to the prior level she was prior to CABG in October 2023.  Pt has SOB and fatigue.  Pt states she is wobbly and can't walk straight.  Pt is limited with cleaning her home and has to take rest breaks.  Pt states she can't be active for > 20-25 mins without taking a rest break.  She has to take a rest after lunch though is not sleeping as she used to.  Pt is limited with ambulation distance.  Pt has performed pulmonary rehab in 2022 and feels like her SOB is like it was prior to pulmonary rehab.  MD note indicated PT at [facility] for strengthening and conditioning to increase stamina and help energy levels.    Pt saw Dr. Denyse Amass for thoracic and lumbar pain. She began having R scapular/thoracic in the fall of 2022 and L scapula/thoracic pain in 2023.  Her lower back pain began in Oct 2024.  Pt had lumbar x-rays and is having chest CT today.  MD note indicated chronic thoracic and chronic low back pain.  Pain due to degenerative changes and muscle dysfunction .  MD note indicated plan to proceed with PT.   Pt is a Management consultant and works out 3x/wk.  Pt uses the Nustep, recumbent bike, and octane.  Pt has had memory problems following her CABG surgery.  She is followed by neurology.  She states the tests didn't show anything so far  from neurology.  Shed has a neuropsychological evaluation scheduled for November. "  PERTINENT HISTORY: Chronic thoracic pain and lumbar pain CAD s/p CABG x 4 on 01/04/2022 Osteoporosis syncope, pulmonary sarcoidosis, implanted loop recorder Arthritis and HTN Memory problems since CABG surgery--she is being followed by neurology.    PAIN:   06/06/23: 4/10 L shoulder, low back   PRECAUTIONS: Other: Implanted loop recorder, osteoporosis, balance, CABG, syncope    WEIGHT BEARING RESTRICTIONS: No  FALLS:  Has patient fallen in last 6 months? Yes. Number of falls 1 on 02/23/2023, syncope  LIVING ENVIRONMENT: Lives with: lives with their spouse Lives in:  1 story home Stairs: 2-4 steps to enter/exit home with rail Has following equipment at  home: Single point cane, Walker - 4 wheeled, and walking sticks  OCCUPATION: Pt is retired  PLOF: Independent  PATIENT GOALS: to walk a straight line, to have improved activity tolerance after a short rest break, be more flexible     OBJECTIVE:  Note: Objective measures were completed at Evaluation unless otherwise noted.  DIAGNOSTIC FINDINGS:  Per MD note, Lumbar X-rays showed DDD worse L5-S1.  No acute fractures are visible.   PATIENT SURVEYS:  LEFS 42/80  COGNITION: Overall cognitive status: Within functional limits for tasks assessed  Pt states she has memory problems and is being followed by neurology.      POSTURE:  FHP, Cervical in flexion   VITALS:  O2:  96-97% which she states is what she normally is        HR:  80   LOWER EXTREMITY MMT:  MMT Right eval Left eval  Hip flexion 4-/5 4/5  Hip extension    Hip abduction 18.5 18.9  Hip adduction    Hip internal rotation    Hip external rotation    Knee flexion 5/5 4+/5  Knee extension 4/5 4-5  Ankle dorsiflexion 5/5 5/5  Ankle plantarflexion    Ankle inversion    Ankle eversion     (Blank rows = not tested)   FUNCTIONAL TESTS:  5x STS test: 24.05 without  UE support 3 MWT:  529 ft.   Pt felt SOB at 1 min 48 sec.  O2:  99%, HR:  104   06/12/23: 5xSTS 13.95sec no UE support  455ft no AD, mild SOB after 2 min, O2 97-99% RA HR mid 80s  GAIT: Comments: slow gait speed, decreased foot clearance on L occasionally                                                                                                                                TREATMENT DATE:  [facility] Adult PT Treatment:                                                DATE: 06/12/23 Vitals mid session: HR 72-75,SpO2 99% RA  Therapeutic Activity: Warm up walk 3x30sec w/ 90 sec rest (slow pace) Sit to stand x5 (separate from 5xSTS)cues for pacing 5xSTS + education + education 3 step weight shifting practice x6 BIL cues for mechanics andpacing Continued education/discussion re: activity modification and pacing strategies     [facility] Adult PT Treatment:                                                DATE: 06/06/23 Neuromuscular re-ed: Tandem stance 3x30sec BIL CGA Slow karaokes along mat 2 laps  w/ seated rest Requires seated rest between activities given exertional fatigue  Therapeutic Activity: 2x45sec ambulation w/ rest Continued education/discussion re: activity modification, pacing, use of RPE scale   [facility] Adult PT Treatment:                                                DATE: 05/28/23  Neuromuscular re-ed: Tandem stance 2x30sec BIL CGA-minA Fwd/retro walking 2x64ft CGA Lateral stepping 2x39ft CGA  Therapeutic Activity: 2x30sec ambulation w/ rest ambulation w/ 2 min rest Significant time spent w/ education on pacing of activities, use of Borg Dyspnea scale, strategies to monitor/modify activities safely    PATIENT EDUCATION:  Education details: rationale for interventions, HEP  Person educated: Patient Education method: Explanation, Demonstration, Tactile cues, Verbal cues Education comprehension: verbalized understanding, returned  demonstration, verbal cues required, tactile cues required, and needs further education     HOME EXERCISE PROGRAM: Access Code: [email or web address] URL: [email or web address] Date: 05/29/2023 Prepared by: Fransisco Hertz  Exercises - Standing Tandem Balance with Counter Support  - 2-3 x daily - 1-2 sets - 1-2 reps - 30sec hold - Side Stepping with Counter Support  - 2-3 x daily - 1 sets - 8 reps - Backward Walking with Counter Support  - 2-3 x daily - 1 sets - 8 reps  ASSESSMENT:  CLINICAL IMPRESSION: 06/12/2023 Pt arrives w/ report of moderate progress since starting PT, continues to fluctuate as expected. Much of session spent with assessment and education re: STG. Noted significant improvement in 5xSTS, has exceeded STG but remains around cutoff score for fall risk. Reduced distance with but pt endorses reduced subjective dyspnea and improved vital response. Ending session w/ education on pacing/activity modification and working on weight shifting to improve mechanics when fatigued. No adverse events, tolerates session well and vitals stable throughout. Recommend continuing along current POC in order to address relevant deficits and improve functional tolerance. Pt departs today's session in no acute distress, all voiced questions/concerns addressed appropriately from PT perspective.      Per eval - Patient is a 78 y.o. female with dx's of exertional dyspnea and physical deconditioning.  Pt underwent CABG x 4 in October 2023 and is not making the progress they expected.  Pt has SOB and fatigue with activities.  Pt is limited with cleaning her home and has to take rest breaks.  Pt states she can't be active for > 20-25 mins without taking a rest break.  Pt is limited with ambulation distance.  Pt has muscle weakness in bilat LE's.  It required increased time for pt to perform 5x STS test.  Pt was seen by Dr. Denyse Amass also for thoracic and lumbar pain and plans for pt to have PT.   Pt should  benefit from skilled PT to address impairments and improve overall function.     OBJECTIVE IMPAIRMENTS: decreased activity tolerance, decreased balance, decreased endurance, decreased mobility, difficulty walking, decreased strength, and pain.   ACTIVITY LIMITATIONS: standing, squatting, transfers, and locomotion level  PARTICIPATION LIMITATIONS: cleaning, shopping, and community activity  PERSONAL FACTORS: 3+ comorbidities: osteoporosis, CABG, arthritis, syncope, pulmonary sarcoidosis  are also affecting patient's functional outcome.   REHAB POTENTIAL: Good  CLINICAL DECISION MAKING: Stable/uncomplicated  EVALUATION COMPLEXITY: Low   GOALS:  SHORT TERM GOALS: Target date: 06/11/2023  Pt will be independent and compliant with HEP for improved  strength, tolerance to activity, functional endurance, pain, and mobility.   Baseline: 06/12/23: HEP ongoing Goal status: ONGOING  2.  Pt will report at least a 25% improvement in tolerance to activity and her functional mobility.  Baseline:  06/12/23: reports 35-40% improvement, particularly with housework Goal status: MET  3.  Pt will perform 5x STS test in no > than 19 sec for improved functional LE strength and performance of transfers.   Baseline:  06/12/23: 13.95sec no UE support Goal status: MET  4.  Pt will ambulate entire 6 MWT test.   Baseline:  06/12/23: Goal status: ONGOING   LONG TERM GOALS: Target date: 07/02/2023  Pt will report at least a 70% improvement in her tolerance with daily activities and functional endurance with mobility.    Baseline:  Goal status: INITIAL  2.  Pt will increase ambulation distance by at least 150 ft on 3 MWT.  Baseline:  Goal status: INITIAL  3.  Pt will report increased ambulation distance with reduced fatigue and SOB. Baseline:  Goal status: INITIAL  4.  Pt will perform 5x STS test in < than 16 sec for improved functional LE strength and performance of transfers.  Baseline:   Goal status: INITIAL  5.  Pt will demo improved bilat LE strength to 4+/5 in hip flexion and 5/5 in knee extension and at least 6-8# increase in abd for improved performance of functional mobility and performance of household chores. Baseline:  Goal status: INITIAL    PLAN:  PT FREQUENCY: 2x/week  PT DURATION: 6 weeks  PLANNED INTERVENTIONS: 97164- PT Re-evaluation, 97110-Therapeutic exercises, 97530- Therapeutic activity, [id number]- Neuromuscular re-education, 97535- Self Care, [id number]- Manual therapy, [id number]- Gait training, [phone]- Aquatic Therapy, [phone]- Ultrasound, Patient/Family education, Balance training, Stair training, Taping, Dry Needling, Cryotherapy, and Moist heat  PLAN FOR NEXT SESSION:  review/update HEP PRN. Recommend STS practice, LE strengthening. Continuing with balance. Monitoring activity tolerance via borg dyspnea scale and/or vitals. NO e-stim    Ashley Murrain PT, DPT 06/12/2023 11:01 AM

## 2023-06-12 ENCOUNTER — Encounter (HOSPITAL_BASED_OUTPATIENT_CLINIC_OR_DEPARTMENT_OTHER): Payer: Self-pay | Admitting: Physical Therapy

## 2023-06-12 ENCOUNTER — Encounter (HOSPITAL_BASED_OUTPATIENT_CLINIC_OR_DEPARTMENT_OTHER): Payer: Self-pay

## 2023-06-12 ENCOUNTER — Ambulatory Visit (HOSPITAL_BASED_OUTPATIENT_CLINIC_OR_DEPARTMENT_OTHER): Payer: Self-pay | Admitting: Physical Therapy

## 2023-06-12 DIAGNOSIS — R262 Difficulty in walking, not elsewhere classified: Secondary | ICD-10-CM

## 2023-06-12 DIAGNOSIS — M6281 Muscle weakness (generalized): Secondary | ICD-10-CM

## 2023-06-12 DIAGNOSIS — R0609 Other forms of dyspnea: Secondary | ICD-10-CM | POA: Diagnosis not present

## 2023-06-13 ENCOUNTER — Other Ambulatory Visit (HOSPITAL_BASED_OUTPATIENT_CLINIC_OR_DEPARTMENT_OTHER): Payer: Self-pay

## 2023-06-13 MED ORDER — COMIRNATY 30 MCG/0.3ML IM SUSY
0.3000 mL | PREFILLED_SYRINGE | Freq: Once | INTRAMUSCULAR | 0 refills | Status: AC
  Filled 2023-06-13: qty 0.3, 1d supply, fill #0

## 2023-06-15 NOTE — Therapy (Signed)
 OUTPATIENT PHYSICAL THERAPY TREATMENT   Patient Name: Kristen Suarez MRN: [id number] DOB:06-04-1945, 78 y.o., female Today's Date: 06/17/2023  END OF SESSION:  PT End of Session - 06/16/23 1031     Visit Number 5    Number of Visits 12    Date for PT Re-Evaluation 07/02/23    Authorization Type MCR A and B    PT Start Time 1024    PT Stop Time 1108    PT Time Calculation (min) 44 min    Activity Tolerance Patient tolerated treatment well    Behavior During Therapy [facility] for tasks assessed/performed                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/1976   Past Surgical History:  Procedure Laterality Date   ABDOMINAL HYSTERECTOMY  1987   BASAL CELL CARCINOMA EXCISION  06/30/2020   nose   CARDIAC CATHETERIZATION     CLIPPING OF ATRIAL APPENDAGE N/A 01/04/2022   Procedure: CLIPPING OF ATRIAL APPENDAGE USING SIZE 40 ATRICURE ATRIAL CLIP;  Surgeon: Lyn Hollingshead, MD;  Location: [facility] OR;  Service: Open Heart Surgery;  Laterality: N/A;   CORONARY ARTERY BYPASS GRAFT N/A 01/04/2022   Procedure: CORONARY ARTERY BYPASS GRAFTING (CABG) x  USING ENDOSCOPIC GREATER SAPHENOUS VEIN HARVEST;  Surgeon: Lyn Hollingshead, MD;  Location: [facility] OR;  Service: Open Heart Surgery;  Laterality: N/A;   LEFT HEART CATH AND CORONARY ANGIOGRAPHY N/A 01/02/2022   Procedure: LEFT HEART CATH AND CORONARY ANGIOGRAPHY;  Surgeon: Lennette Bihari, MD;  Location: [facility] INVASIVE CV LAB;  Service: Cardiovascular;  Laterality: N/A;   LUNG BIOPSY  06/16/1976   PERIPHERAL VASCULAR INTERVENTION Right 11/29/2020    Procedure: PERIPHERAL VASCULAR INTERVENTION;  Surgeon: Iran Ouch, MD;  Location: [facility] INVASIVE CV LAB;  Service: Cardiovascular;  Laterality: Right;  Renal Artery   RENAL ANGIOGRAPHY N/A 11/29/2020   Procedure: RENAL ANGIOGRAPHY;  Surgeon: Iran Ouch, MD;  Location: [facility] INVASIVE CV LAB;  Service: Cardiovascular;  Laterality: N/A;   TEE WITHOUT CARDIOVERSION N/A 01/04/2022   Procedure: TRANSESOPHAGEAL ECHOCARDIOGRAM (TEE);  Surgeon: Lyn Hollingshead, MD;  Location: [facility] OR;  Service: Open Heart Surgery;  Laterality: N/A;   Patient Active Problem List   Diagnosis Date Noted   Memory impairment 03/21/2023   Weight loss, unintentional 12/31/2022   Adjustment insomnia 08/19/2022   Nocturia more than twice per night 08/19/2022   Bilateral sensorineural hearing loss 06/27/2022   Hearing loss of right ear 02/04/2022   Hx of CABG 01/04/2022   Elevated coronary artery calcium score    Chronic kidney disease, stage 3b (HCC) 01/01/2022   Syncope 12/31/2021   Renal artery stenosis (HCC) 09/25/2021   Aortic valve sclerosis 09/25/2021   Snoring 04/13/2021   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   Epistaxis 03/29/2021   Coronary artery disease due to calcified coronary lesion 08/15/2020   Basal cell carcinoma 06/30/2020   History of loop recorder 12/13/2019   Allergic rhinitis 12/08/2019  Chronic cough 11/08/2019   Post-nasal drip 11/08/2019   Multiple joint pain 04/22/2019   Bilateral leg edema 04/22/2019   Vaginal atrophy 01/05/2019   Resting tremor 09/08/2018   Sarcoidosis 05/08/2018   HTN (hypertension) 05/08/2018   Colon polyp 05/08/2018   Family hx of colon cancer 05/08/2018   Cystocele, midline 05/08/2018   Hyperlipidemia 05/08/2018   Vasovagal syncope 05/08/2018   Seborrheic keratosis 05/08/2018   Pseudophakia of both eyes 05/08/2018   GERD (gastroesophageal reflux disease) 05/08/2018   COPD (chronic obstructive pulmonary disease) (HCC) 05/08/2018   Osteoporosis 12/31/2016      REFERRING PROVIDER: Alver Sorrow, NP   REFERRING DIAG: R06.09 (ICD-10-CM) - Exertional dyspnea              R53.81 (ICD-10-CM) - Physical deconditioning  THERAPY DIAG:  Muscle weakness (generalized)  Difficulty in walking, not elsewhere classified  Rationale for Evaluation and Treatment: Rehabilitation  ONSET DATE: PT order 04/24/2023  SUBJECTIVE:   SUBJECTIVE STATEMENT:   Pt denies any adverse effects after prior Rx.  Pt states she continues to take a rest in the afternoon and sometimes falls asleep for 1-2 hours.  Pt states she feels about the same overall.  Pt has been performing her HEP though forgets to do her walks.  Pt did walk 20 mins (2/3 mile) outside yesterday with her hiking sticks and was "very winded" after the walk.  Pt states she has been cleared for yoga by cardiology.  Pt states she stumbled at home when she was taking some sidesteps after getting out of a chair.  She regained her balance and did not fall.   PERTINENT HISTORY: Chronic thoracic pain and lumbar pain CAD s/p CABG x 4 on 01/04/2022 Osteoporosis syncope, pulmonary sarcoidosis, implanted loop recorder Arthritis and HTN Memory problems since CABG surgery--she is being followed by neurology.    PAIN:   4/10 L shoulder, central lumbar and bilat sides of lumbar   PRECAUTIONS: Other: Implanted loop recorder, osteoporosis, balance, CABG, syncope    WEIGHT BEARING RESTRICTIONS: No  FALLS:  Has patient fallen in last 6 months? Yes. Number of falls 1 on 02/23/2023, syncope  LIVING ENVIRONMENT: Lives with: lives with their spouse Lives in:  1 story home Stairs: 2-4 steps to enter/exit home with rail Has following equipment at home: Single point cane, Walker - 4 wheeled, and walking sticks  OCCUPATION: Pt is retired  PLOF: Independent  PATIENT GOALS: to walk a straight line, to have improved activity tolerance after a short rest break, be more flexible     OBJECTIVE:  Note: Objective  measures were completed at Evaluation unless otherwise noted.  DIAGNOSTIC FINDINGS:  Per MD note, Lumbar X-rays showed DDD worse L5-S1.  No acute fractures are visible.   PATIENT SURVEYS:  LEFS 42/80  COGNITION: Overall cognitive status: Within functional limits for tasks assessed  Pt states she has memory problems and is being followed by neurology.      POSTURE:  FHP, Cervical in flexion   VITALS:  O2:  96-97% which she states is what she normally is        HR:  80   LOWER EXTREMITY MMT:  MMT Right eval Left eval  Hip flexion 4-/5 4/5  Hip extension    Hip abduction 18.5 18.9  Hip adduction    Hip internal rotation    Hip external rotation    Knee flexion 5/5 4+/5  Knee extension 4/5 4-5  Ankle dorsiflexion 5/5 5/5  Ankle plantarflexion  Ankle inversion    Ankle eversion     (Blank rows = not tested)   FUNCTIONAL TESTS:  5x STS test: 24.05 without UE support 3 MWT:  529 ft.   Pt felt SOB at 1 min 48 sec.  O2:  99%, HR:  104   06/12/23: 5xSTS 13.95sec no UE support  430ft no AD, mild SOB after 2 min, O2 97-99% RA HR mid 80s  GAIT: Comments: slow gait speed, decreased foot clearance on L occasionally                                                                                                                                TREATMENT DATE:  [facility] Adult PT Treatment:                                                DATE: 06/16/23 Vitals at beginning of Rx:  O2:  99%, HR:  77  Vitals at end of Rx:  HR: 79, O2:  99%  Nustep just LE's at L3 x 5 mins with a rest break at 3 mins  At 3 mins:  HR:  89, O2: 97%  At 5 mins:  HR:  85, O2:  98%  Sit to stands 2x5 reps from chair with hands on knees though didn't use hands  Walking  1 min 5 sec.  HR:  86-88, O2: 99% 2.5 min rest  1 min 8 sec:  HR:  81-82, O2: 99 %  Sidestepping x 2 laps at rail without UE support with SBA Tandem stance 2x30 sec each with occasional UE support  LAQ x 10 AROM bilat, 1#  bilat     [facility] Adult PT Treatment:                                                DATE: 06/12/23 Vitals mid session: HR 72-75,SpO2 99% RA  Therapeutic Activity: Warm up walk 3x30sec w/ 90 sec rest (slow pace) Sit to stand x5 (separate from 5xSTS)cues for pacing 5xSTS + education + education 3 step weight shifting practice x6 BIL cues for mechanics andpacing Continued education/discussion re: activity modification and pacing strategies     [facility] Adult PT Treatment:                                                DATE: 06/06/23 Neuromuscular re-ed: Tandem stance 3x30sec BIL CGA Slow karaokes along mat 2 laps w/ seated rest Requires seated rest between activities given exertional fatigue  Therapeutic Activity: 2x45sec  ambulation w/ rest Continued education/discussion re: activity modification, pacing, use of RPE scale   [facility] Adult PT Treatment:                                                DATE: 05/28/23  Neuromuscular re-ed: Tandem stance 2x30sec BIL CGA-minA Fwd/retro walking 2x28ft CGA Lateral stepping 2x48ft CGA  Therapeutic Activity: 2x30sec ambulation w/ rest ambulation w/ 2 min rest Significant time spent w/ education on pacing of activities, use of Borg Dyspnea scale, strategies to monitor/modify activities safely    PATIENT EDUCATION:  Education details: rationale for interventions, HEP  Person educated: Patient Education method: Explanation, Demonstration, Tactile cues, Verbal cues Education comprehension: verbalized understanding, returned demonstration, verbal cues required, tactile cues required, and needs further education     HOME EXERCISE PROGRAM: Access Code: [email or web address] URL: [email or web address] Date: 05/29/2023 Prepared by: Fransisco Hertz  Exercises - Standing Tandem Balance with Counter Support  - 2-3 x daily - 1-2 sets - 1-2 reps - 30sec hold - Side Stepping with Counter Support  - 2-3 x daily - 1 sets - 8 reps -  Backward Walking with Counter Support  - 2-3 x daily - 1 sets - 8 reps  ASSESSMENT:  CLINICAL IMPRESSION: Pt seems to be improving with PT.  She reports being able to walk for 20 mins outdoors yesterday which was 2/3 of a mile.  Pt was fatigued with exercises and therapeutic activities today.  PT provided appropriate rest periods t/o Rx and monitored vitals.  HR decreased appropriately with rest.  Pt had good O2 levels t/o Rx.  Pt had labored breathing with ambulation.  Pt gave good effort with all exercises.  She responded well to Rx having no c/o's after Rx.  Pt should benefit from cont skilled PT to address ongoing goals and improve tolerance to activity, functional endurance, strength, and mobility.       OBJECTIVE IMPAIRMENTS: decreased activity tolerance, decreased balance, decreased endurance, decreased mobility, difficulty walking, decreased strength, and pain.   ACTIVITY LIMITATIONS: standing, squatting, transfers, and locomotion level  PARTICIPATION LIMITATIONS: cleaning, shopping, and community activity  PERSONAL FACTORS: 3+ comorbidities: osteoporosis, CABG, arthritis, syncope, pulmonary sarcoidosis  are also affecting patient's functional outcome.   REHAB POTENTIAL: Good  CLINICAL DECISION MAKING: Stable/uncomplicated  EVALUATION COMPLEXITY: Low   GOALS:  SHORT TERM GOALS: Target date: 06/11/2023  Pt will be independent and compliant with HEP for improved strength, tolerance to activity, functional endurance, pain, and mobility.   Baseline: 06/12/23: HEP ongoing Goal status: ONGOING  2.  Pt will report at least a 25% improvement in tolerance to activity and her functional mobility.  Baseline:  06/12/23: reports 35-40% improvement, particularly with housework Goal status: MET  3.  Pt will perform 5x STS test in no > than 19 sec for improved functional LE strength and performance of transfers.   Baseline:  06/12/23: 13.95sec no UE support Goal status: MET  4.  Pt  will ambulate entire 6 MWT test.   Baseline:  06/12/23: Goal status: ONGOING   LONG TERM GOALS: Target date: 07/02/2023  Pt will report at least a 70% improvement in her tolerance with daily activities and functional endurance with mobility.    Baseline:  Goal status: INITIAL  2.  Pt will increase ambulation distance by at least 150 ft on 3 MWT.  Baseline:  Goal status: INITIAL  3.  Pt will report increased ambulation distance with reduced fatigue and SOB. Baseline:  Goal status: INITIAL  4.  Pt will perform 5x STS test in < than 16 sec for improved functional LE strength and performance of transfers.  Baseline:  Goal status: INITIAL  5.  Pt will demo improved bilat LE strength to 4+/5 in hip flexion and 5/5 in knee extension and at least 6-8# increase in abd for improved performance of functional mobility and performance of household chores. Baseline:  Goal status: INITIAL    PLAN:  PT FREQUENCY: 2x/week  PT DURATION: 6 weeks  PLANNED INTERVENTIONS: 97164- PT Re-evaluation, 97110-Therapeutic exercises, 97530- Therapeutic activity, [id number]- Neuromuscular re-education, 97535- Self Care, 16109- Manual therapy, [id number]- Gait training, [phone]- Aquatic Therapy, [phone]- Ultrasound, Patient/Family education, Balance training, Stair training, Taping, Dry Needling, Cryotherapy, and Moist heat  PLAN FOR NEXT SESSION:  review/update HEP PRN. Recommend STS practice, LE strengthening. Continuing with balance. Monitoring activity tolerance via borg dyspnea scale and/or vitals. NO e-stim   Audie Clear III PT, DPT 06/17/23 6:48 AM

## 2023-06-16 ENCOUNTER — Ambulatory Visit (HOSPITAL_BASED_OUTPATIENT_CLINIC_OR_DEPARTMENT_OTHER): Admitting: Physical Therapy

## 2023-06-16 DIAGNOSIS — R262 Difficulty in walking, not elsewhere classified: Secondary | ICD-10-CM

## 2023-06-16 DIAGNOSIS — R0609 Other forms of dyspnea: Secondary | ICD-10-CM | POA: Diagnosis not present

## 2023-06-16 DIAGNOSIS — M6281 Muscle weakness (generalized): Secondary | ICD-10-CM

## 2023-06-17 ENCOUNTER — Encounter (HOSPITAL_BASED_OUTPATIENT_CLINIC_OR_DEPARTMENT_OTHER): Payer: Self-pay | Admitting: Physical Therapy

## 2023-06-18 ENCOUNTER — Ambulatory Visit (HOSPITAL_BASED_OUTPATIENT_CLINIC_OR_DEPARTMENT_OTHER): Attending: Family | Admitting: Physical Therapy

## 2023-06-18 ENCOUNTER — Encounter (HOSPITAL_BASED_OUTPATIENT_CLINIC_OR_DEPARTMENT_OTHER): Payer: Self-pay | Admitting: Physical Therapy

## 2023-06-18 DIAGNOSIS — G8929 Other chronic pain: Secondary | ICD-10-CM | POA: Insufficient documentation

## 2023-06-18 DIAGNOSIS — M6281 Muscle weakness (generalized): Secondary | ICD-10-CM | POA: Diagnosis present

## 2023-06-18 DIAGNOSIS — M545 Low back pain, unspecified: Secondary | ICD-10-CM | POA: Diagnosis present

## 2023-06-18 DIAGNOSIS — R293 Abnormal posture: Secondary | ICD-10-CM | POA: Insufficient documentation

## 2023-06-18 DIAGNOSIS — R262 Difficulty in walking, not elsewhere classified: Secondary | ICD-10-CM | POA: Insufficient documentation

## 2023-06-18 DIAGNOSIS — M5459 Other low back pain: Secondary | ICD-10-CM | POA: Diagnosis present

## 2023-06-18 DIAGNOSIS — M546 Pain in thoracic spine: Secondary | ICD-10-CM | POA: Insufficient documentation

## 2023-06-18 NOTE — Therapy (Signed)
 OUTPATIENT PHYSICAL THERAPY TREATMENT   Patient Name: Kristen Suarez MRN: [id number] DOB:1945/05/09, 78 y.o., female Today's Date: 06/19/2023  END OF SESSION:  PT End of Session - 06/18/23 1148     Visit Number 6    Number of Visits 12    Date for PT Re-Evaluation 07/02/23    Authorization Type MCR A and B    Progress Note Due on Visit 10    PT Start Time 1109    PT Stop Time 1151    PT Time Calculation (min) 42 min    Activity Tolerance Patient tolerated treatment well    Behavior During Therapy [facility] for tasks assessed/performed                 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/1976   Past Surgical History:  Procedure Laterality Date   ABDOMINAL HYSTERECTOMY  1987   BASAL CELL CARCINOMA EXCISION  06/30/2020   nose   CARDIAC CATHETERIZATION     CLIPPING OF ATRIAL APPENDAGE N/A 01/04/2022   Procedure: CLIPPING OF ATRIAL APPENDAGE USING SIZE 40 ATRICURE ATRIAL CLIP;  Surgeon: Lyn Hollingshead, MD;  Location: [facility] OR;  Service: Open Heart Surgery;  Laterality: N/A;   CORONARY ARTERY BYPASS GRAFT N/A 01/04/2022   Procedure: CORONARY ARTERY BYPASS GRAFTING (CABG) x  USING ENDOSCOPIC GREATER SAPHENOUS VEIN HARVEST;  Surgeon: Lyn Hollingshead, MD;  Location: [facility] OR;  Service: Open Heart Surgery;  Laterality: N/A;   LEFT HEART CATH AND CORONARY ANGIOGRAPHY N/A 01/02/2022   Procedure: LEFT HEART CATH AND CORONARY ANGIOGRAPHY;  Surgeon: Lennette Bihari, MD;  Location: [facility] INVASIVE CV LAB;  Service: Cardiovascular;  Laterality: N/A;   LUNG BIOPSY  06/16/1976   PERIPHERAL VASCULAR  INTERVENTION Right 11/29/2020   Procedure: PERIPHERAL VASCULAR INTERVENTION;  Surgeon: Iran Ouch, MD;  Location: [facility] INVASIVE CV LAB;  Service: Cardiovascular;  Laterality: Right;  Renal Artery   RENAL ANGIOGRAPHY N/A 11/29/2020   Procedure: RENAL ANGIOGRAPHY;  Surgeon: Iran Ouch, MD;  Location: [facility] INVASIVE CV LAB;  Service: Cardiovascular;  Laterality: N/A;   TEE WITHOUT CARDIOVERSION N/A 01/04/2022   Procedure: TRANSESOPHAGEAL ECHOCARDIOGRAM (TEE);  Surgeon: Lyn Hollingshead, MD;  Location: [facility] OR;  Service: Open Heart Surgery;  Laterality: N/A;   Patient Active Problem List   Diagnosis Date Noted   Memory impairment 03/21/2023   Weight loss, unintentional 12/31/2022   Adjustment insomnia 08/19/2022   Nocturia more than twice per night 08/19/2022   Bilateral sensorineural hearing loss 06/27/2022   Hearing loss of right ear 02/04/2022   Hx of CABG 01/04/2022   Elevated coronary artery calcium score    Chronic kidney disease, stage 3b (HCC) 01/01/2022   Syncope 12/31/2021   Renal artery stenosis (HCC) 09/25/2021   Aortic valve sclerosis 09/25/2021   Snoring 04/13/2021   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   Epistaxis 03/29/2021   Coronary artery disease due to calcified coronary lesion 08/15/2020   Basal cell carcinoma 06/30/2020   History  of loop recorder 12/13/2019   Allergic rhinitis 12/08/2019   Chronic cough 11/08/2019   Post-nasal drip 11/08/2019   Multiple joint pain 04/22/2019   Bilateral leg edema 04/22/2019   Vaginal atrophy 01/05/2019   Resting tremor 09/08/2018   Sarcoidosis 05/08/2018   HTN (hypertension) 05/08/2018   Colon polyp 05/08/2018   Family hx of colon cancer 05/08/2018   Cystocele, midline 05/08/2018   Hyperlipidemia 05/08/2018   Vasovagal syncope 05/08/2018   Seborrheic keratosis 05/08/2018   Pseudophakia of both eyes 05/08/2018   GERD (gastroesophageal reflux disease) 05/08/2018   COPD (chronic obstructive pulmonary disease) (HCC)  05/08/2018   Osteoporosis 12/31/2016     REFERRING PROVIDER: Alver Sorrow, NP   REFERRING DIAG: R06.09 (ICD-10-CM) - Exertional dyspnea              R53.81 (ICD-10-CM) - Physical deconditioning  THERAPY DIAG:  Muscle weakness (generalized)  Difficulty in walking, not elsewhere classified  Rationale for Evaluation and Treatment: Rehabilitation  ONSET DATE: PT order 04/24/2023  SUBJECTIVE:   SUBJECTIVE STATEMENT:   Pt states she felt ok after prior Rx and did fell a pulling sensation in R thigh.  She reports no pain, maybe an ache in R thigh after Rx.  Pt states she continues to take a rest in the afternoon and sometimes falls asleep for 1-2 hours.  Pt has been performing her HEP though forgets to do her walks.  Pt did walk 20 mins (2/3 mile) outside  with her hiking sticks and was "very winded" after the walk.    PERTINENT HISTORY: Chronic thoracic pain and lumbar pain CAD s/p CABG x 4 on 01/04/2022 Osteoporosis syncope, pulmonary sarcoidosis, implanted loop recorder Arthritis and HTN Memory problems since CABG surgery--she is being followed by neurology.    PAIN:   3/10 L shoulder  Pt took tylenol earlier.  PRECAUTIONS: Other: Implanted loop recorder, osteoporosis, balance, CABG, syncope    WEIGHT BEARING RESTRICTIONS: No  FALLS:  Has patient fallen in last 6 months? Yes. Number of falls 1 on 02/23/2023, syncope  LIVING ENVIRONMENT: Lives with: lives with their spouse Lives in:  1 story home Stairs: 2-4 steps to enter/exit home with rail Has following equipment at home: Single point cane, Walker - 4 wheeled, and walking sticks  OCCUPATION: Pt is retired  PLOF: Independent  PATIENT GOALS: to walk a straight line, to have improved activity tolerance after a short rest break, be more flexible     OBJECTIVE:  Note: Objective measures were completed at Evaluation unless otherwise noted.  DIAGNOSTIC FINDINGS:  Per MD note, Lumbar X-rays showed DDD worse  L5-S1.  No acute fractures are visible.   PATIENT SURVEYS:  LEFS 42/80  COGNITION: Overall cognitive status: Within functional limits for tasks assessed  Pt states she has memory problems and is being followed by neurology.      POSTURE:  FHP, Cervical in flexion   VITALS:  O2:  96-97% which she states is what she normally is        HR:  80   LOWER EXTREMITY MMT:  MMT Right eval Left eval  Hip flexion 4-/5 4/5  Hip extension    Hip abduction 18.5 18.9  Hip adduction    Hip internal rotation    Hip external rotation    Knee flexion 5/5 4+/5  Knee extension 4/5 4-5  Ankle dorsiflexion 5/5 5/5  Ankle plantarflexion    Ankle inversion    Ankle eversion     (Blank rows =  not tested)   FUNCTIONAL TESTS:  5x STS test: 24.05 without UE support 3 MWT:  529 ft.   Pt felt SOB at 1 min 48 sec.  O2:  99%, HR:  104   06/12/23: 5xSTS 13.95sec no UE support  420ft no AD, mild SOB after 2 min, O2 97-99% RA HR mid 80s  GAIT: Comments: slow gait speed, decreased foot clearance on L occasionally                                                                                                                                TREATMENT DATE:  [facility] Adult PT Treatment:                                                DATE: 06/18/23 Vitals at beginning of Rx:  O2:  99%, HR:  78  Vitals at end of Rx:  HR: 79, O2:  99%  Nustep just LE's at x 6 mins with a rest break at 3 mins  L3 x 3 mins:  HR:  84, O2: 98%  L4 x 3 mins:  HR:  84, O2:  98%  Sit to stands 2x7 reps from chair with hands on knees though didn't use hands  Walking  1 min 55 sec (300 ft).  HR:  86-88, O2: 97-98% 3 min rest--HR improved to 76 and O2 to 99%  1 min 46 sec (300 ft):  HR:  89, O2: 96 %  Sidestepping x 2 laps at rail without UE support with SBA Slow karaoke with UE support on rail x 1 lap  HR:  90, O2:  99% Tandem stance 2x30 sec each with occasional UE support  LAQ 2x10  1# bilat   [facility] Adult PT  Treatment:                                                DATE: 06/16/23 Vitals at beginning of Rx:  O2:  99%, HR:  77  Vitals at end of Rx:  HR: 79, O2:  99%  Nustep just LE's at L3 x 5 mins with a rest break at 3 mins  At 3 mins:  HR:  89, O2: 97%  At 5 mins:  HR:  85, O2:  98%  Sit to stands 2x5 reps from chair with hands on knees though didn't use hands  Walking  1 min 5 sec.  HR:  86-88, O2: 99% 2.5 min rest  1 min 8 sec:  HR:  81-82, O2: 99 %  Sidestepping x 2 laps at rail without UE support with SBA Tandem stance 2x30 sec each with occasional UE support  LAQ  x 10 AROM bilat, 1# bilat     [facility] Adult PT Treatment:                                                DATE: 06/12/23 Vitals mid session: HR 72-75,SpO2 99% RA  Therapeutic Activity: Warm up walk 3x30sec w/ 90 sec rest (slow pace) Sit to stand x5 (separate from 5xSTS)cues for pacing 5xSTS + education + education 3 step weight shifting practice x6 BIL cues for mechanics andpacing Continued education/discussion re: activity modification and pacing strategies     [facility] Adult PT Treatment:                                                DATE: 06/06/23 Neuromuscular re-ed: Tandem stance 3x30sec BIL CGA Slow karaokes along mat 2 laps w/ seated rest Requires seated rest between activities given exertional fatigue  Therapeutic Activity: 2x45sec ambulation w/ rest Continued education/discussion re: activity modification, pacing, use of RPE scale     PATIENT EDUCATION:  Education details: rationale for interventions, HEP  Person educated: Patient Education method: Explanation, Demonstration, Tactile cues, Verbal cues Education comprehension: verbalized understanding, returned demonstration, verbal cues required, tactile cues required, and needs further education     HOME EXERCISE PROGRAM: Access Code: [email or web address] URL: [email or web address] Date: 05/29/2023 Prepared by: Fransisco Hertz  Exercises - Standing Tandem Balance with Counter Support  - 2-3 x daily - 1-2 sets - 1-2 reps - 30sec hold - Side Stepping with Counter Support  - 2-3 x daily - 1 sets - 8 reps - Backward Walking with Counter Support  - 2-3 x daily - 1 sets - 8 reps  ASSESSMENT:  CLINICAL IMPRESSION: PT progressed intensity of exercises today and pt tolerated it well.  Pt able to increase intensity of Nustep and increase sit to stand reps today.  Pt also ambulated increased distance/duration today.  Pt had labored breathing with ambulation.  She was fatigued with exercises and activities.  PT provided appropriate rest periods t/o Rx and monitored vitals.  HR decreased appropriately with rest.  Pt had good O2 levels t/o Rx.  Pt gave good effort with all exercises.  She responded well to Rx having no c/o's after Rx.  Pt should benefit from cont skilled PT to address ongoing goals and improve tolerance to activity, functional endurance, strength, and mobility.         OBJECTIVE IMPAIRMENTS: decreased activity tolerance, decreased balance, decreased endurance, decreased mobility, difficulty walking, decreased strength, and pain.   ACTIVITY LIMITATIONS: standing, squatting, transfers, and locomotion level  PARTICIPATION LIMITATIONS: cleaning, shopping, and community activity  PERSONAL FACTORS: 3+ comorbidities: osteoporosis, CABG, arthritis, syncope, pulmonary sarcoidosis  are also affecting patient's functional outcome.   REHAB POTENTIAL: Good  CLINICAL DECISION MAKING: Stable/uncomplicated  EVALUATION COMPLEXITY: Low   GOALS:  SHORT TERM GOALS: Target date: 06/11/2023  Pt will be independent and compliant with HEP for improved strength, tolerance to activity, functional endurance, pain, and mobility.   Baseline: 06/12/23: HEP ongoing Goal status: ONGOING  2.  Pt will report at least a 25% improvement in tolerance to activity and her functional mobility.  Baseline:  06/12/23: reports  35-40% improvement, particularly with housework Goal status: MET  3.  Pt will perform 5x STS test in no > than 19 sec for improved functional LE strength and performance of transfers.   Baseline:  06/12/23: 13.95sec no UE support Goal status: MET  4.  Pt will ambulate entire 6 MWT test.   Baseline:  06/12/23: Goal status: ONGOING   LONG TERM GOALS: Target date: 07/02/2023  Pt will report at least a 70% improvement in her tolerance with daily activities and functional endurance with mobility.    Baseline:  Goal status: INITIAL  2.  Pt will increase ambulation distance by at least 150 ft on 3 MWT.  Baseline:  Goal status: INITIAL  3.  Pt will report increased ambulation distance with reduced fatigue and SOB. Baseline:  Goal status: INITIAL  4.  Pt will perform 5x STS test in < than 16 sec for improved functional LE strength and performance of transfers.  Baseline:  Goal status: INITIAL  5.  Pt will demo improved bilat LE strength to 4+/5 in hip flexion and 5/5 in knee extension and at least 6-8# increase in abd for improved performance of functional mobility and performance of household chores. Baseline:  Goal status: INITIAL    PLAN:  PT FREQUENCY: 2x/week  PT DURATION: 6 weeks  PLANNED INTERVENTIONS: 97164- PT Re-evaluation, 97110-Therapeutic exercises, 97530- Therapeutic activity, [id number]- Neuromuscular re-education, 97535- Self Care, [id number]- Manual therapy, [id number]- Gait training, [phone]- Aquatic Therapy, [phone]- Ultrasound, Patient/Family education, Balance training, Stair training, Taping, Dry Needling, Cryotherapy, and Moist heat  PLAN FOR NEXT SESSION:  review/update HEP PRN. Recommend STS practice, LE strengthening. Continuing with balance. Monitoring activity tolerance via borg dyspnea scale and/or vitals. NO e-stim   Audie Clear III PT, DPT 06/19/23 12:42 PM

## 2023-06-19 ENCOUNTER — Encounter: Payer: Self-pay | Admitting: Physician Assistant

## 2023-06-19 ENCOUNTER — Ambulatory Visit (INDEPENDENT_AMBULATORY_CARE_PROVIDER_SITE_OTHER): Payer: Medicare Other | Admitting: Physician Assistant

## 2023-06-19 ENCOUNTER — Encounter (HOSPITAL_BASED_OUTPATIENT_CLINIC_OR_DEPARTMENT_OTHER): Payer: Self-pay | Admitting: Physical Therapy

## 2023-06-19 VITALS — BP 170/81 | HR 88 | Resp 20 | Wt 129.0 lb

## 2023-06-19 DIAGNOSIS — R413 Other amnesia: Secondary | ICD-10-CM | POA: Diagnosis not present

## 2023-06-19 NOTE — Progress Notes (Signed)
 Assessment/Plan:   Memory Impairment of unclear etiology  Kristen Suarez is a very pleasant 78 y.o. RH female with a history of hypertension, hyperlipidemia, pre-diabetes, COPD, sarcoidosis, HOH, CAD sp CABG 2023, history of recurrent  syncope of unclear etiology although vasovagal is suspected  presenting today in follow-up for evaluation of memory loss. Patient is not on antidementia medications at this time, per patient choice, given her cardiac history, concerns for undesirable side effects, and lives were to be indicated by future neuropsych evaluation.  She is able to participate on her ADLs and to drive without difficulties.      Recommendations:   Follow up after neuropsych evaluation Patient is scheduled for neuropsych evaluation in the near future for diagnostic clarity Recommend follow-up with audiology for bilateral hearing loss Recommend good control of cardiovascular risk factors, follow at advanced hypertension clinic/cardiology Continue to control mood as per PCP Continue PT for strength and balance     Subjective:   This patient is accompanied in the office by her husband who supplements the history. Previous records as well as any outside records available were reviewed prior to todays visit.   Patient was last seen on 03/21/2023 with [facility] 25/30     Any changes in memory since last visit? "  About the same ". She continues to have some difficulty remembering new information, recent conversations, names.  She sets the kitchen timer to remind herself of a chore.  She likes to read. repeats oneself?  Endorsed Disoriented when walking into a room?  Patient denies    Misplacing objects?  Patient denies   Wandering behavior?   denies   Any personality changes since last visit?  Her main concern is her memory at this time, she feels that after CABG, "my psyche is not where it used to be ". Any worsening depression?: denies   Hallucinations or paranoia?  denies    Seizures?   denies    Any sleep changes? Does not sleep very well.   Denies vivid dreams, REM behavior or sleepwalking   Sleep apnea?   denies    Any hygiene concerns?   denies   Independent of bathing and dressing?  Endorsed  Does the patient needs help with medications? Patient is in charge   Who is in charge of the finances?  Husband is in charge     Any changes in appetite?  denies     Patient have trouble swallowing?  denies   Does the patient cook?  Any kitchen accidents such as leaving the stove on?   denies   Any headaches?    denies   Vision changes? denies Chronic pain?  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  She also has chronic joint pain and myalgia.  2 osteoarthritis, history of sarcoidosis Ambulates with difficulty?  Sometimes she stumbles, she has been doing physical therapy.   Recent falls or head injuries?    denies      Unilateral weakness, numbness or tingling?   denies   Any tremors?  "I have morning tremors for the last 4 years, then goes away ".  She reports that these are coinciding with respiratory medications and inhalers Any anosmia?    denies   Any incontinence of urine?  She has a history of prolapsed bladder, does bladder therapy Any bowel dysfunction?  denies      Patient lives with her husband  Does the patient drive?  Yes, denies any issues  Initial visit  03/21/2023  How long did patient have memory difficulties?  "Immediately after CABG 12/2021". Patient reports some difficulty remembering new information, recent conversations, names. She sets the kitchen timer to remind herself of a chore.  This issue affects her, as she is highly educated and eloquent. She has not been doing any brain games, but likes to read.  repeats oneself?  Endorsed, to her children. Disoriented when walking into a room?  Patient denies.    Leaving objects in unusual places?  Denies.   Wandering behavior? denies   Any personality changes, or depression, anxiety? "I feel  more anxious since the surgery, I am not who I was before, my psyche is not where is supposed to be "  Hallucinations or paranoia? denies   Seizures? Denies. During the syncopal episode she was "out for a few seconds", denies tongue biting or muscle tightness, foam.     Any sleep changes? Does not sleep well, she has been on trazodone with some relief. She had vivid dreams  for 5-6 months after surgery, but not now.  Denies REM behavior or sleepwalking   Sleep apnea? "Before the surgery but not now" Any hygiene concerns?  Denies.   Independent of bathing and dressing? Endorsed  Does the patient need help with medications? Patient is in charge   Who is in charge of the finances? Husband  is in charge     Any changes in appetite?   Denies.     Patient have trouble swallowing?  Denies.   Does the patient cook? Yes, cannot not remember common recipes as before, I have to cook with my husband". Denies kitchen accidents.  Any headaches? History of headaches since the surgery, in the setting of BP.   Chronic pain?She reports sternal pain from the surgery. Ambulates with difficulty? Sometimes she stumbles, "my steps are not straight sometimes".  Recent falls or head injuries? In May and on Dec 1024 she had a syncopal episode felt to be vasovagal, triggered by heat/exercise. In Dec she fell hit her R forehead and R shoulder and R elbow, RLE abrasion.   Vision changes?  Denies any new issues.  Had cataract surgery in the recent past  Any strokelike symptoms? Denies.   Any tremors? "I have a morning tremor for the last 4 y, then goes away" with time coinciding with respiratory medications/inhalers.. Any anosmia? Denies.   Any incontinence of urine? Has prolapsed bladder, does bladder therapy Any bowel dysfunction? Denies.      Patient lives with husband   History of heavy alcohol intake? Denies.   History of heavy tobacco use? Denies.   Family history of dementia?   Denies    Does patient drive?yes  ,denies any issues  Masters in [employer] and other causes at [employer] in [place]   EEG was within normal limits, no seizure activity was noted.  MRI of the brain 04/10/2023 read by radiology 04/23/23 without acute findings, no focal abnormality.  Minimal small vessel changes in the white matter.    Past Medical History:  Diagnosis Date   Abnormality of left atrial appendage 12/2021   s/p LAA clipping   Aortic valve sclerosis 09/25/2021   Arthritis    Asthma    CAD in native artery 08/15/2020   Chickenpox    Colon polyps 2010   Epistaxis 03/29/2021   Heart murmur    History of fainting spells of unknown cause    Hyperlipidemia    Hypertension  Renal artery stenosis (HCC) 09/25/2021   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/1976     Past Surgical History:  Procedure Laterality Date   ABDOMINAL HYSTERECTOMY  1987   BASAL CELL CARCINOMA EXCISION  06/30/2020   nose   CARDIAC CATHETERIZATION     CLIPPING OF ATRIAL APPENDAGE N/A 01/04/2022   Procedure: CLIPPING OF ATRIAL APPENDAGE USING SIZE 40 ATRICURE ATRIAL CLIP;  Surgeon: Lyn Hollingshead, MD;  Location: [facility] OR;  Service: Open Heart Surgery;  Laterality: N/A;   CORONARY ARTERY BYPASS GRAFT N/A 01/04/2022   Procedure: CORONARY ARTERY BYPASS GRAFTING (CABG) x  USING ENDOSCOPIC GREATER SAPHENOUS VEIN HARVEST;  Surgeon: Lyn Hollingshead, MD;  Location: [facility] OR;  Service: Open Heart Surgery;  Laterality: N/A;   LEFT HEART CATH AND CORONARY ANGIOGRAPHY N/A 01/02/2022   Procedure: LEFT HEART CATH AND CORONARY ANGIOGRAPHY;  Surgeon: Lennette Bihari, MD;  Location: [facility];  Service: Cardiovascular;  Laterality: N/A;   LUNG BIOPSY  06/16/1976   PERIPHERAL VASCULAR INTERVENTION Right 11/29/2020   Procedure: PERIPHERAL VASCULAR INTERVENTION;  Surgeon: Iran Ouch, MD;  Location: [facility];  Service: Cardiovascular;  Laterality: Right;  Renal Artery   RENAL ANGIOGRAPHY N/A 11/29/2020    Procedure: RENAL ANGIOGRAPHY;  Surgeon: Iran Ouch, MD;  Location: [facility];  Service: Cardiovascular;  Laterality: N/A;   TEE WITHOUT CARDIOVERSION N/A 01/04/2022   Procedure: TRANSESOPHAGEAL ECHOCARDIOGRAM (TEE);  Surgeon: Lyn Hollingshead, MD;  Location: [facility] OR;  Service: Open Heart Surgery;  Laterality: N/A;     PREVIOUS MEDICATIONS:   CURRENT MEDICATIONS:  Outpatient Encounter Medications as of 06/19/2023  Medication Sig   acetaminophen (TYLENOL) 500 MG tablet Take 500 mg by mouth every 6 (six) hours as needed.   albuterol (PROVENTIL) (2.5 MG/3ML) 0.083% nebulizer solution TAKE 3 MLS (2.5 MG TOTAL) BY NEBULIZATION EVERY 4 (FOUR) HOURS AS NEEDED.   amLODipine (NORVASC) 10 MG tablet Take 1 tablet (10 mg total) by mouth daily.   aspirin EC 81 MG tablet Take 1 tablet (81 mg total) by mouth daily. Swallow whole.   azithromycin (ZITHROMAX Z-PAK) 250 MG tablet Take 2 tablets day 1 and then 1 daily for 4 days   Budeson-Glycopyrrol-Formoterol (BREZTRI AEROSPHERE) 160-9-4.8 MCG/ACT AERO Inhale 2 puffs into the lungs in the morning and at bedtime.   calcium carbonate (CALCIUM 600) 600 MG TABS tablet Take 600 mg by mouth daily with breakfast.   cetirizine (ZYRTEC) 10 MG tablet Take 10 mg by mouth daily.   CRANBERRY PO Take by mouth.   cyanocobalamin 1000 MCG tablet Take 1,000 mcg by mouth daily.   denosumab (PROLIA) 60 MG/ML SOSY injection Inject 60 mg into the skin every 6 (six) months.   docusate sodium (COLACE) 100 MG capsule Take 100 mg by mouth daily.    Evolocumab (REPATHA SURECLICK) 140 MG/ML SOAJ INJECT 140 MG INTO THE SKIN EVERY 14 (FOURTEEN) DAYS.   fluticasone (FLONASE) 50 MCG/ACT nasal spray SPRAY 2 SPRAYS INTO EACH NOSTRIL EVERY DAY   guaiFENesin (MUCINEX PO) Take by mouth 2 (two) times daily.   MAGNESIUM PO Take by mouth every other day.   metoprolol succinate (TOPROL XL) 25 MG 24 hr tablet Take 1 tablet (25 mg total) by mouth daily.   Multiple Vitamins-Minerals  (MULTIVITAL PO) Take 1 tablet by mouth daily.    pantoprazole (PROTONIX) 40 MG tablet TAKE 1 TABLET BY MOUTH EVERY DAY   predniSONE (DELTASONE) 20 MG tablet Take 1  tablet (20 mg total) by mouth daily with breakfast.   Probiotic Product (PROBIOTIC DAILY PO) Take 1 tablet by mouth daily.    rosuvastatin (CRESTOR) 40 MG tablet TAKE 1 TABLET BY MOUTH EVERY DAY   sodium chloride (OCEAN) 0.65 % SOLN nasal spray Place 1 spray into both nostrils as needed for congestion.   traZODone (DESYREL) 50 MG tablet TAKE 1/2 TABLET DAILY AT BEDTIME AS NEEDED   valsartan (DIOVAN) 40 MG tablet Take 1 tablet (40 mg total) by mouth daily.   Facility-Administered Encounter Medications as of 06/19/2023  Medication   [START ON 11/12/2023] denosumab (PROLIA) injection 60 mg     Objective:     PHYSICAL EXAMINATION:    VITALS:   Vitals:   06/19/23 1113 06/19/23 1143  BP: (!) 153/77 (!) 170/81  Pulse: 88   Resp: 20   SpO2: 95%   Weight: 129 lb (58.5 kg)     GEN:  The patient appears stated age and is in NAD. HEENT:  Normocephalic, atraumatic.   Neurological examination:  General: NAD, well-groomed, appears stated age. Orientation: The patient is alert. Oriented to person, place and date Cranial nerves: There is good facial symmetry.The speech is fluent and clear. No aphasia or dysarthria. Fund of knowledge is appropriate. Recent memory impaired and remote memory is normal.  Attention and concentration are normal.  Able to name objects and repeat phrases.  Hearing is intact to conversational tone  .    Sensation: Sensation is intact to light touch throughout Motor: Strength is at least antigravity x4. DTR's 2/4 in UE/LE      03/21/2023   10:00 AM  Montreal Cognitive Assessment   Visuospatial/ Executive (0/5) 2  Naming (0/3) 3  Attention: Read list of digits (0/2) 2  Attention: Read list of letters (0/1) 1  Attention: Serial 7 subtraction starting at 100 (0/3) 3  Language: Repeat phrase (0/2) 2   Language : Fluency (0/1) 1  Abstraction (0/2) 2  Delayed Recall (0/5) 3  Orientation (0/6) 6  Total 25  Adjusted Score (based on education) 25       09/08/2018    9:07 AM  MMSE - Mini Mental State Exam  Orientation to time 5  Orientation to Place 5  Registration 3  Attention/ Calculation 5  Recall 3  Language- name 2 objects 2  Language- repeat 1  Language- follow 3 step command 3  Language- read & follow direction 1  Write a sentence 1  Copy design 1  Total score 30       Movement examination: Tone: There is normal tone in the UE/LE Abnormal movements:  no tremor.  No myoclonus.  No asterixis.   Coordination:  There is no decremation with RAM's. Normal finger to nose  Gait and Station: The patient has no difficulty arising out of a deep-seated chair without the use of the hands. The patient's stride length is good.  Gait is cautious and narrow.   Thank you for allowing [place] the opportunity to participate in the care of this nice patient. Please do not hesitate to contact us for any questions or concerns.   Total time spent on today's visit was 31 minutes dedicated to this patient today, preparing to see patient, examining the patient, ordering tests and/or medications and counseling the patient, documenting clinical information in the EHR or other health record, independently interpreting results and communicating results to the patient/family, discussing treatment and goals, answering patient's questions and coordinating care.  Cc:  Nche, Bonna Gains, NP  Marlowe Kays 06/19/2023 2:59 PM

## 2023-06-19 NOTE — Patient Instructions (Addendum)
 It was a pleasure to see you today at our office.   Recommendations:  Neurocognitive evaluation at our office   Follow up in Nov 13 11:30     For psychiatric meds, mood meds: Please have your primary care physician manage these medications.  If you have any severe symptoms of a stroke, or other severe issues such as confusion,severe chills or fever, etc call 911 or go to the ER as you may need to be evaluated further    For assessment of decision of mental capacity and competency:  Call Dr. Erick Blinks, geriatric psychiatrist at [phone]  Counseling regarding caregiver distress, including caregiver depression, anxiety and issues regarding community resources, adult day care programs, adult living facilities, or memory care questions:  please contact your  Primary Doctor's Social Worker   Whom to call: Memory  decline, memory medications: Call our office [phone]    [email or web address]   RECOMMENDATIONS FOR ALL PATIENTS WITH MEMORY PROBLEMS: 1. Continue to exercise (Recommend 30 minutes of walking everyday, or 3 hours every week) 2. Increase social interactions - continue going to [place] and enjoy social gatherings with friends and family 3. Eat healthy, avoid fried foods and eat more fruits and vegetables 4. Maintain adequate blood pressure, blood sugar, and blood cholesterol level. Reducing the risk of stroke and cardiovascular disease also helps promoting better memory. 5. Avoid stressful situations. Live a simple life and avoid aggravations. Organize your time and prepare for the next day in anticipation. 6. Sleep well, avoid any interruptions of sleep and avoid any distractions in the bedroom that may interfere with adequate sleep quality 7. Avoid sugar, avoid sweets as there is a strong link between excessive sugar intake, diabetes, and cognitive impairment We discussed the Mediterranean diet, which has been shown  to help patients reduce the risk of progressive memory disorders and reduces cardiovascular risk. This includes eating fish, eat fruits and green leafy vegetables, nuts like almonds and hazelnuts, walnuts, and also use olive oil. Avoid fast foods and fried foods as much as possible. Avoid sweets and sugar as sugar use has been linked to worsening of memory function.  There is always a concern of gradual progression of memory problems. If this is the case, then we may need to adjust level of care according to patient needs. Support, both to the patient and caregiver, should then be put into place.      You have been referred for a neuropsychological evaluation (i.e., evaluation of memory and thinking abilities). Please bring someone with you to this appointment if possible, as it is helpful for the doctor to hear from both you and another adult who knows you well. Please bring eyeglasses and hearing aids if you wear them.    The evaluation will take approximately 3 hours and has two parts:   The first part is a clinical interview with the neuropsychologist (Dr. Milbert Coulter or Dr. Roseanne Reno). During the interview, the neuropsychologist will speak with you and the individual you brought to the appointment.    The second part of the evaluation is testing with the doctor's technician Annabelle Harman or Selena Batten). During the testing, the technician will ask you to remember different types of material, solve problems, and answer some questionnaires. Your family member will not be present for this portion of the evaluation.   Please note: We must reserve several hours of the neuropsychologist's time and the psychometrician's time for your evaluation appointment. As such, there is a No-Show fee of $100. If you are  unable to attend any of your appointments, please contact our office as soon as possible to reschedule.      DRIVING: Regarding driving, in patients with progressive memory problems, driving will be impaired. We advise  to have someone else do the driving if trouble finding directions or if minor accidents are reported. Independent driving assessment is available to determine safety of driving.   If you are interested in the driving assessment, you can contact the following:  [facility] in [place] [phone]  [facility] [phone]  [facility] [phone]  [facility] [phone] or [phone]   FALL PRECAUTIONS: Be cautious when walking. Scan the area for obstacles that may increase the risk of trips and falls. When getting up in the mornings, sit up at the edge of the bed for a few minutes before getting out of bed. Consider elevating the bed at the head end to avoid drop of blood pressure when getting up. Walk always in a well-lit room (use night lights in the walls). Avoid area rugs or power cords from appliances in the middle of the walkways. Use a walker or a cane if necessary and consider physical therapy for balance exercise. Get your eyesight checked regularly.  FINANCIAL OVERSIGHT: Supervision, especially oversight when making financial decisions or transactions is also recommended.  HOME SAFETY: Consider the safety of the kitchen when operating appliances like stoves, microwave oven, and blender. Consider having supervision and share cooking responsibilities until no longer able to participate in those. Accidents with firearms and other hazards in the house should be identified and addressed as well.   ABILITY TO BE LEFT ALONE: If patient is unable to contact 911 operator, consider using LifeLine, or when the need is there, arrange for someone to stay with patients. Smoking is a fire hazard, consider supervision or cessation. Risk of wandering should be assessed by caregiver and if detected at any point, supervision and safe proof recommendations should be instituted.  MEDICATION SUPERVISION: Inability to self-administer medication needs  to be constantly addressed. Implement a mechanism to ensure safe administration of the medications.      Mediterranean Diet A Mediterranean diet refers to food and lifestyle choices that are based on the traditions of countries located on the [place]. This way of eating has been shown to help prevent certain conditions and improve outcomes for people who have chronic diseases, like kidney disease and heart disease. What are tips for following this plan? Lifestyle  Cook and eat meals together with your family, when possible. Drink enough fluid to keep your urine clear or pale yellow. Be physically active every day. This includes: Aerobic exercise like running or swimming. Leisure activities like gardening, walking, or housework. Get 7-8 hours of sleep each night. If recommended by your health care provider, drink red wine in moderation. This means 1 glass a day for nonpregnant women and 2 glasses a day for men. A glass of wine equals 5 oz (150 mL). Reading food labels  Check the serving size of packaged foods. For foods such as rice and pasta, the serving size refers to the amount of cooked product, not dry. Check the total fat in packaged foods. Avoid foods that have saturated fat or trans fats. Check the ingredients list for added sugars, such as corn syrup. Shopping  At the grocery store, buy most of your food from the areas near the walls of the store. This includes: Fresh fruits and vegetables (produce). Grains, beans, nuts, and seeds.  Some of these may be available in unpackaged forms or large amounts (in bulk). Fresh seafood. Poultry and eggs. Low-fat dairy products. Buy whole ingredients instead of prepackaged foods. Buy fresh fruits and vegetables in-season from local farmers markets. Buy frozen fruits and vegetables in resealable bags. If you do not have access to quality fresh seafood, buy precooked frozen shrimp or canned fish, such as tuna, salmon, or  sardines. Buy small amounts of raw or cooked vegetables, salads, or olives from the deli or salad bar at your store. Stock your pantry so you always have certain foods on hand, such as olive oil, canned tuna, canned tomatoes, rice, pasta, and beans. Cooking  Cook foods with extra-virgin olive oil instead of using butter or other vegetable oils. Have meat as a side dish, and have vegetables or grains as your main dish. This means having meat in small portions or adding small amounts of meat to foods like pasta or stew. Use beans or vegetables instead of meat in common dishes like chili or lasagna. Experiment with different cooking methods. Try roasting or broiling vegetables instead of steaming or sauteing them. Add frozen vegetables to soups, stews, pasta, or rice. Add nuts or seeds for added healthy fat at each meal. You can add these to yogurt, salads, or vegetable dishes. Marinate fish or vegetables using olive oil, lemon juice, garlic, and fresh herbs. Meal planning  Plan to eat 1 vegetarian meal one day each week. Try to work up to 2 vegetarian meals, if possible. Eat seafood 2 or more times a week. Have healthy snacks readily available, such as: Vegetable sticks with hummus. Greek yogurt. Fruit and nut trail mix. Eat balanced meals throughout the week. This includes: Fruit: 2-3 servings a day Vegetables: 4-5 servings a day Low-fat dairy: 2 servings a day Fish, poultry, or lean meat: 1 serving a day Beans and legumes: 2 or more servings a week Nuts and seeds: 1-2 servings a day Whole grains: 6-8 servings a day Extra-virgin olive oil: 3-4 servings a day Limit red meat and sweets to only a few servings a month What are my food choices? Mediterranean diet Recommended Grains: Whole-grain pasta. Brown rice. Bulgar wheat. Polenta. Couscous. Whole-wheat bread. Orpah Cobb. Vegetables: Artichokes. Beets. Broccoli. Cabbage. Carrots. Eggplant. Green beans. Chard. Kale. Spinach.  Onions. Leeks. Peas. Squash. Tomatoes. Peppers. Radishes. Fruits: Apples. Apricots. Avocado. Berries. Bananas. Cherries. Dates. Figs. Grapes. Lemons. Melon. Oranges. Peaches. Plums. Pomegranate. Meats and other protein foods: Beans. Almonds. Sunflower seeds. Pine nuts. Peanuts. Cod. Salmon. Scallops. Shrimp. Tuna. Tilapia. Clams. Oysters. Eggs. Dairy: Low-fat milk. Cheese. Greek yogurt. Beverages: Water. Red wine. Herbal tea. Fats and oils: Extra virgin olive oil. Avocado oil. Grape seed oil. Sweets and desserts: [place] yogurt with honey. Baked apples. Poached pears. Trail mix. Seasoning and other foods: Basil. Cilantro. Coriander. Cumin. Mint. Parsley. Sage. Rosemary. Tarragon. Garlic. Oregano. Thyme. Pepper. Balsalmic vinegar. Tahini. Hummus. Tomato sauce. Olives. Mushrooms. Limit these Grains: Prepackaged pasta or rice dishes. Prepackaged cereal with added sugar. Vegetables: Deep fried potatoes (french fries). Fruits: Fruit canned in syrup. Meats and other protein foods: Beef. Pork. Lamb. Poultry with skin. Hot dogs. Tomasa Blase. Dairy: Ice cream. Sour cream. Whole milk. Beverages: Juice. Sugar-sweetened soft drinks. Beer. Liquor and spirits. Fats and oils: Butter. Canola oil. Vegetable oil. Beef fat (tallow). Lard. Sweets and desserts: Cookies. Cakes. Pies. Candy. Seasoning and other foods: Mayonnaise. Premade sauces and marinades. The items listed may not be a complete list. Talk with your dietitian about what dietary choices are  right for you. Summary The Mediterranean diet includes both food and lifestyle choices. Eat a variety of fresh fruits and vegetables, beans, nuts, seeds, and whole grains. Limit the amount of red meat and sweets that you eat. Talk with your health care provider about whether it is safe for you to drink red wine in moderation. This means 1 glass a day for nonpregnant women and 2 glasses a day for men. A glass of wine equals 5 oz (150 mL). This information is not  intended to replace advice given to you by your health care provider. Make sure you discuss any questions you have with your health care provider. Document Released: 10/26/2015 Document Revised: 11/28/2015 Document Reviewed: 10/26/2015 Elsevier Interactive Patient Education  2017 [employer].

## 2023-06-20 ENCOUNTER — Encounter: Payer: Self-pay | Admitting: Family Medicine

## 2023-06-20 ENCOUNTER — Ambulatory Visit: Admitting: Family Medicine

## 2023-06-20 VITALS — BP 142/70 | HR 73 | Ht 62.0 in | Wt 130.0 lb

## 2023-06-20 DIAGNOSIS — G8929 Other chronic pain: Secondary | ICD-10-CM | POA: Diagnosis not present

## 2023-06-20 DIAGNOSIS — M546 Pain in thoracic spine: Secondary | ICD-10-CM

## 2023-06-20 DIAGNOSIS — M545 Low back pain, unspecified: Secondary | ICD-10-CM

## 2023-06-20 NOTE — Progress Notes (Signed)
   I, Stevenson Clinch, CMA acting as a scribe for Clementeen Graham, MD.  Kristen Suarez is a 78 y.o. female who presents to [facility] today for 91-month f/u LBP. Pt was last seen by Dr. Denyse Amass on 05/20/23 and was advised to cont PT, completing 6 visits.  Today, pt reports working mostly on balance with PT. Lower back sx are bilateral. Denies radiating pain. Denies n/t/w. Also c/o left shoulder and periscapular pain. Had to sleep on her back for months s/p cardiac surgery, gets some relief with prone. Taking Tylenol prn with minimal relief. Was having neck pain but this has improved with correction of posture. Denies n/t in the L UE.   Dx testing: 05/20/23 L-spine XR  05/01/23 DEXA scan  Pertinent review of systems: No fevers or chills  Relevant historical information: Renal artery stenosis.  COPD.  Hypertension.  CKD.   Exam:  BP (!) 142/70   Pulse 73   Ht 5\' 2"  (1.575 m)   Wt 130 lb (59 kg)   SpO2 99%   BMI 23.78 kg/m  General: Well Developed, well nourished, and in no acute distress.   MSK: T-spine: Normal appearing Tender palpation left trapezius and rhomboid region.  Normal shoulder motion.   Left scapula and rhomboid region visualized on CT scan chest March 2025 personally and independently interpreted today. No acute fractures are visible.  No significant soft tissue abnormality is visible in this region.    Assessment and Plan: 78 y.o. female with chronic left rhomboid pain.  This is a chronic problem that has been challenging to treat.  Plan to give it another month with PT and home exercise program.  If not better consider trigger point injection.  We talked about the risk of this procedure especially with COPD.  I am reluctant to do this injection but if she just cannot get better I think it is worth a try. [facility] to focus more on the upper and low back pain.  They have been mostly focusing on balance.  PDMP not reviewed this  encounter. Orders Placed This Encounter  Procedures   Ambulatory referral to Physical Therapy    Referral Priority:   Routine    Referral Type:   Physical Medicine    Referral Reason:   Specialty Services Required    Requested Specialty:   Physical Therapy    Number of Visits Requested:   1   No orders of the defined types were placed in this encounter.    Discussed warning signs or symptoms. Please see discharge instructions. Patient expresses understanding.   The above documentation has been reviewed and is accurate and complete Clementeen Graham, M.D.

## 2023-06-20 NOTE — Patient Instructions (Addendum)
 Thank you for coming in today.   Work on [employer].   Try water therapy or aerobics.   Recheck in 1 month.  If not better I think we should do that injection.

## 2023-06-23 ENCOUNTER — Ambulatory Visit (HOSPITAL_BASED_OUTPATIENT_CLINIC_OR_DEPARTMENT_OTHER): Admitting: Physical Therapy

## 2023-06-23 DIAGNOSIS — M6281 Muscle weakness (generalized): Secondary | ICD-10-CM

## 2023-06-23 DIAGNOSIS — R262 Difficulty in walking, not elsewhere classified: Secondary | ICD-10-CM

## 2023-06-23 NOTE — Therapy (Signed)
 OUTPATIENT PHYSICAL THERAPY TREATMENT   Patient Name: Kristen Suarez MRN: [id number] DOB:08-05-1945, 78 y.o., female Today's Date: 06/24/2023  END OF SESSION:  PT End of Session - 06/23/23 1218     Visit Number 7    Number of Visits 12    Date for PT Re-Evaluation 07/02/23    Authorization Type MCR A and B    PT Start Time 1122    PT Stop Time 1207    PT Time Calculation (min) 45 min    Activity Tolerance Patient tolerated treatment well    Behavior During Therapy [facility] for tasks assessed/performed                  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/1976   Past Surgical History:  Procedure Laterality Date   ABDOMINAL HYSTERECTOMY  1987   BASAL CELL CARCINOMA EXCISION  06/30/2020   nose   CARDIAC CATHETERIZATION     CLIPPING OF ATRIAL APPENDAGE N/A 01/04/2022   Procedure: CLIPPING OF ATRIAL APPENDAGE USING SIZE 40 ATRICURE ATRIAL CLIP;  Surgeon: Lyn Hollingshead, MD;  Location: [facility] OR;  Service: Open Heart Surgery;  Laterality: N/A;   CORONARY ARTERY BYPASS GRAFT N/A 01/04/2022   Procedure: CORONARY ARTERY BYPASS GRAFTING (CABG) x  USING ENDOSCOPIC GREATER SAPHENOUS VEIN HARVEST;  Surgeon: Lyn Hollingshead, MD;  Location: [facility] OR;  Service: Open Heart Surgery;  Laterality: N/A;   LEFT HEART CATH AND CORONARY ANGIOGRAPHY N/A 01/02/2022   Procedure: LEFT HEART CATH AND CORONARY ANGIOGRAPHY;  Surgeon: Lennette Bihari, MD;  Location: [facility] INVASIVE CV LAB;  Service: Cardiovascular;  Laterality: N/A;   LUNG BIOPSY  06/16/1976   PERIPHERAL VASCULAR INTERVENTION Right 11/29/2020    Procedure: PERIPHERAL VASCULAR INTERVENTION;  Surgeon: Iran Ouch, MD;  Location: [facility] INVASIVE CV LAB;  Service: Cardiovascular;  Laterality: Right;  Renal Artery   RENAL ANGIOGRAPHY N/A 11/29/2020   Procedure: RENAL ANGIOGRAPHY;  Surgeon: Iran Ouch, MD;  Location: [facility] INVASIVE CV LAB;  Service: Cardiovascular;  Laterality: N/A;   TEE WITHOUT CARDIOVERSION N/A 01/04/2022   Procedure: TRANSESOPHAGEAL ECHOCARDIOGRAM (TEE);  Surgeon: Lyn Hollingshead, MD;  Location: [facility] OR;  Service: Open Heart Surgery;  Laterality: N/A;   Patient Active Problem List   Diagnosis Date Noted   Memory impairment 03/21/2023   Weight loss, unintentional 12/31/2022   Adjustment insomnia 08/19/2022   Nocturia more than twice per night 08/19/2022   Bilateral sensorineural hearing loss 06/27/2022   Hearing loss of right ear 02/04/2022   Hx of CABG 01/04/2022   Elevated coronary artery calcium score    Chronic kidney disease, stage 3b (HCC) 01/01/2022   Syncope 12/31/2021   Renal artery stenosis (HCC) 09/25/2021   Aortic valve sclerosis 09/25/2021   Snoring 04/13/2021   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   Epistaxis 03/29/2021   Coronary artery disease due to calcified coronary lesion 08/15/2020   Basal cell carcinoma 06/30/2020   History of loop recorder 12/13/2019   Allergic rhinitis  12/08/2019   Chronic cough 11/08/2019   Post-nasal drip 11/08/2019   Multiple joint pain 04/22/2019   Bilateral leg edema 04/22/2019   Vaginal atrophy 01/05/2019   Resting tremor 09/08/2018   Sarcoidosis 05/08/2018   HTN (hypertension) 05/08/2018   Colon polyp 05/08/2018   Family hx of colon cancer 05/08/2018   Cystocele, midline 05/08/2018   Hyperlipidemia 05/08/2018   Vasovagal syncope 05/08/2018   Seborrheic keratosis 05/08/2018   Pseudophakia of both eyes 05/08/2018   GERD (gastroesophageal reflux disease) 05/08/2018   COPD (chronic obstructive pulmonary disease) (HCC) 05/08/2018   Osteoporosis 12/31/2016      REFERRING PROVIDER: Alver Sorrow, NP   REFERRING DIAG: R06.09 (ICD-10-CM) - Exertional dyspnea              R53.81 (ICD-10-CM) - Physical deconditioning  THERAPY DIAG:  Muscle weakness (generalized)  Difficulty in walking, not elsewhere classified  Rationale for Evaluation and Treatment: Rehabilitation  ONSET DATE: PT order 04/24/2023  SUBJECTIVE:   SUBJECTIVE STATEMENT:   Pt felt fine after prior Rx, though still had some pulling in the R thigh though not bad.  Pt saw neurologist last week for MRI follow-up and balance issues.  She states her balance issues are not related to neurology.  She had a brain MRI and states she didn't have a CVA.  Pt states the neurologist told her that her brain looks good for her age.  She recommended a neuropsych evaluation.  Notes indicate to continue PT.    Pt saw the sports med doc and had a chest CT.  She states it didn't show anything except arthritis.  Dr. Zollie Pee notes indicated wanting PT to focus more on the upper and low back pain.  MD note indicated to try water therapy or aerobics.    PERTINENT HISTORY: Chronic thoracic pain and lumbar pain CAD s/p CABG x 4 on 01/04/2022 Osteoporosis syncope, pulmonary sarcoidosis, implanted loop recorder Arthritis and HTN Memory problems since CABG surgery--she is being followed by neurology.    PAIN:    PRECAUTIONS: Other: Implanted loop recorder, osteoporosis, balance, CABG, syncope    WEIGHT BEARING RESTRICTIONS: No  FALLS:  Has patient fallen in last 6 months? Yes. Number of falls 1 on 02/23/2023, syncope  LIVING ENVIRONMENT: Lives with: lives with their spouse Lives in:  1 story home Stairs: 2-4 steps to enter/exit home with rail Has following equipment at home: Single point cane, Walker - 4 wheeled, and walking sticks  OCCUPATION: Pt is retired  PLOF: Independent  PATIENT GOALS: to walk a straight line, to have improved activity tolerance after a short rest break, be more  flexible     OBJECTIVE:  Note: Objective measures were completed at Evaluation unless otherwise noted.  DIAGNOSTIC FINDINGS:  Per MD note, Lumbar X-rays showed DDD worse L5-S1.  No acute fractures are visible.   PATIENT SURVEYS:  LEFS 42/80  COGNITION: Overall cognitive status: Within functional limits for tasks assessed  Pt states she has memory problems and is being followed by neurology.      POSTURE:  FHP, Cervical in flexion   VITALS:  O2:  96-97% which she states is what she normally is        HR:  80   LOWER EXTREMITY MMT:  MMT Right eval Left eval  Hip flexion 4-/5 4/5  Hip extension    Hip abduction 18.5 18.9  Hip adduction    Hip internal rotation    Hip external rotation    Knee flexion  5/5 4+/5  Knee extension 4/5 4-5  Ankle dorsiflexion 5/5 5/5  Ankle plantarflexion    Ankle inversion    Ankle eversion     (Blank rows = not tested)   FUNCTIONAL TESTS:  5x STS test: 24.05 without UE support 3 MWT:  529 ft.   Pt felt SOB at 1 min 48 sec.  O2:  99%, HR:  104   06/12/23: 5xSTS 13.95sec no UE support  475ft no AD, mild SOB after 2 min, O2 97-99% RA HR mid 80s  GAIT: Comments: slow gait speed, decreased foot clearance on L occasionally                                                                                                                                TREATMENT DATE:  [facility] Adult PT Treatment:                                                DATE: 06/24/23 Vitals at beginning of Rx:  O2:  99%, HR:  82  Vitals at end of Rx:  HR: 85-86, O2:  99%  Reviewed pt presentation and response to prior Rx.  Nustep x 8 mins with a rest break at 4 mins  L3 x 4 mins:  HR:  89, O2: 99%  L4 x 4 mins:  HR:  93, O2:  99%  Sit to stands 2x10 reps from chair with hands on knees though didn't use hands HR:  96 O2:  99%  Walking  1 min 5 sec (310 ft).  HR:  101-102 which quickly dropped to 96, O2: 97% HR reduced to 82 with rest  Sidestepping x  1 lap at rail without UE support with SBA Slow karaoke with UE support on rail x 2 laps  HR:  90, O2:  99% Tandem Gait with UE support    [facility] Adult PT Treatment:                                                DATE: 06/18/23 Vitals at beginning of Rx:  O2:  99%, HR:  78  Vitals at end of Rx:  HR: 79, O2:  99%  Nustep just LE's at x 6 mins with a rest break at 3 mins  L3 x 3 mins:  HR:  84, O2: 98%  L4 x 3 mins:  HR:  84, O2:  98%  Sit to stands 2x7 reps from chair with hands on knees though didn't use hands  Walking  1 min 55 sec (300 ft).  HR:  86-88, O2: 97-98% 3 min rest--HR improved to 76 and O2 to 99%  1 min  46 sec (300 ft):  HR:  89, O2: 96 %  Sidestepping x 2 laps at rail without UE support with SBA Slow karaoke with UE support on rail x 1 lap  HR:  90, O2:  99% Tandem stance 2x30 sec each with occasional UE support  LAQ 2x10  1# bilat   [facility] Adult PT Treatment:                                                DATE: 06/16/23 Vitals at beginning of Rx:  O2:  99%, HR:  77  Vitals at end of Rx:  HR: 79, O2:  99%  Nustep just LE's at L3 x 5 mins with a rest break at 3 mins  At 3 mins:  HR:  89, O2: 97%  At 5 mins:  HR:  85, O2:  98%  Sit to stands 2x5 reps from chair with hands on knees though didn't use hands  Walking  1 min 5 sec.  HR:  86-88, O2: 99% 2.5 min rest  1 min 8 sec:  HR:  81-82, O2: 99 %  Sidestepping x 2 laps at rail without UE support with SBA Tandem stance 2x30 sec each with occasional UE support  LAQ x 10 AROM bilat, 1# bilat     [facility] Adult PT Treatment:                                                DATE: 06/12/23 Vitals mid session: HR 72-75,SpO2 99% RA  Therapeutic Activity: Warm up walk 3x30sec w/ 90 sec rest (slow pace) Sit to stand x5 (separate from 5xSTS)cues for pacing 5xSTS + education + education 3 step weight shifting practice x6 BIL cues for mechanics andpacing Continued education/discussion re: activity modification and  pacing strategies     [facility] Adult PT Treatment:                                                DATE: 06/06/23 Neuromuscular re-ed: Tandem stance 3x30sec BIL CGA Slow karaokes along mat 2 laps w/ seated rest Requires seated rest between activities given exertional fatigue  Therapeutic Activity: 2x45sec ambulation w/ rest Continued education/discussion re: activity modification, pacing, use of RPE scale     PATIENT EDUCATION:  Education details: rationale for interventions, vitals, relevant anatomy, HEP  Person educated: Patient Education method: Explanation, Demonstration, Tactile cues, Verbal cues Education comprehension: verbalized understanding, returned demonstration, verbal cues required, tactile cues required, and needs further education     HOME EXERCISE PROGRAM: Access Code: [email or web address] URL: [email or web address] Date: 05/29/2023 Prepared by: Fransisco Hertz  Exercises - Standing Tandem Balance with Counter Support  - 2-3 x daily - 1-2 sets - 1-2 reps - 30sec hold - Side Stepping with Counter Support  - 2-3 x daily - 1 sets - 8 reps - Backward Walking with Counter Support  - 2-3 x daily - 1 sets - 8 reps  ASSESSMENT:  CLINICAL IMPRESSION: PT provided appropriate rest periods t/o Rx and assessed vitals t/o Rx.  Pt's HR increased appropriately with activity and decreased  appropriately with rest.  Pt had increased HR with walk today though quickly decreased with resting.  Pt had good O2 levels t/o Rx.  Pt increased repetitions of sit to stands.  Pt performed exercises well and requires UE support with tandem gait and karaoke.  She has seen neurologist and sports med MD recently.  Sports med MD wants her to be seen for her back pain more.  There is an order in EPIC.  She responded well to Rx having no c/o's after Rx.  Pt should benefit from cont skilled PT to address ongoing goals and improve tolerance to activity, functional endurance, strength, and mobility.          OBJECTIVE IMPAIRMENTS: decreased activity tolerance, decreased balance, decreased endurance, decreased mobility, difficulty walking, decreased strength, and pain.   ACTIVITY LIMITATIONS: standing, squatting, transfers, and locomotion level  PARTICIPATION LIMITATIONS: cleaning, shopping, and community activity  PERSONAL FACTORS: 3+ comorbidities: osteoporosis, CABG, arthritis, syncope, pulmonary sarcoidosis  are also affecting patient's functional outcome.   REHAB POTENTIAL: Good  CLINICAL DECISION MAKING: Stable/uncomplicated  EVALUATION COMPLEXITY: Low   GOALS:  SHORT TERM GOALS: Target date: 06/11/2023  Pt will be independent and compliant with HEP for improved strength, tolerance to activity, functional endurance, pain, and mobility.   Baseline: 06/12/23: HEP ongoing Goal status: ONGOING  2.  Pt will report at least a 25% improvement in tolerance to activity and her functional mobility.  Baseline:  06/12/23: reports 35-40% improvement, particularly with housework Goal status: MET  3.  Pt will perform 5x STS test in no > than 19 sec for improved functional LE strength and performance of transfers.   Baseline:  06/12/23: 13.95sec no UE support Goal status: MET  4.  Pt will ambulate entire 6 MWT test.   Baseline:  06/12/23: Goal status: ONGOING   LONG TERM GOALS: Target date: 07/02/2023  Pt will report at least a 70% improvement in her tolerance with daily activities and functional endurance with mobility.    Baseline:  Goal status: INITIAL  2.  Pt will increase ambulation distance by at least 150 ft on 3 MWT.  Baseline:  Goal status: INITIAL  3.  Pt will report increased ambulation distance with reduced fatigue and SOB. Baseline:  Goal status: INITIAL  4.  Pt will perform 5x STS test in < than 16 sec for improved functional LE strength and performance of transfers.  Baseline:  Goal status: INITIAL  5.  Pt will demo improved bilat LE strength to  4+/5 in hip flexion and 5/5 in knee extension and at least 6-8# increase in abd for improved performance of functional mobility and performance of household chores. Baseline:  Goal status: INITIAL    PLAN:  PT FREQUENCY: 2x/week  PT DURATION: 6 weeks  PLANNED INTERVENTIONS: 97164- PT Re-evaluation, 97110-Therapeutic exercises, 97530- Therapeutic activity, [id number]- Neuromuscular re-education, 97535- Self Care, [id number]- Manual therapy, [id number]- Gait training, [phone]- Aquatic Therapy, [phone]- Ultrasound, Patient/Family education, Balance training, Stair training, Taping, Dry Needling, Cryotherapy, and Moist heat  PLAN FOR NEXT SESSION:  review/update HEP PRN. Recommend STS practice, LE strengthening. Continuing with balance. Monitoring activity tolerance via borg dyspnea scale and/or vitals. NO e-stim Pt has an order for low back and L sided thoracic pain from Dr. Denyse Amass.  Will evaluate next Rx.    Audie Clear III PT, DPT 06/24/23 1:07 PM

## 2023-06-24 ENCOUNTER — Encounter (HOSPITAL_BASED_OUTPATIENT_CLINIC_OR_DEPARTMENT_OTHER): Payer: Self-pay | Admitting: Physical Therapy

## 2023-06-24 ENCOUNTER — Encounter: Payer: Self-pay | Admitting: Pulmonary Disease

## 2023-06-24 ENCOUNTER — Encounter (HOSPITAL_BASED_OUTPATIENT_CLINIC_OR_DEPARTMENT_OTHER): Payer: Self-pay

## 2023-06-25 ENCOUNTER — Ambulatory Visit (HOSPITAL_BASED_OUTPATIENT_CLINIC_OR_DEPARTMENT_OTHER): Admitting: Physical Therapy

## 2023-06-25 DIAGNOSIS — M6281 Muscle weakness (generalized): Secondary | ICD-10-CM | POA: Diagnosis not present

## 2023-06-25 DIAGNOSIS — M546 Pain in thoracic spine: Secondary | ICD-10-CM

## 2023-06-25 DIAGNOSIS — M5459 Other low back pain: Secondary | ICD-10-CM

## 2023-06-25 DIAGNOSIS — R293 Abnormal posture: Secondary | ICD-10-CM

## 2023-06-25 NOTE — Therapy (Signed)
 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/1976   Past Surgical History:  Procedure Laterality Date   ABDOMINAL HYSTERECTOMY  1987   BASAL CELL CARCINOMA EXCISION  06/30/2020   nose   CARDIAC CATHETERIZATION     CLIPPING OF ATRIAL APPENDAGE N/A 01/04/2022   Procedure: CLIPPING OF ATRIAL APPENDAGE USING SIZE 40 ATRICURE ATRIAL CLIP;  Surgeon: Lyn Hollingshead, MD;  Location: [facility] OR;  Service: Open Heart Surgery;  Laterality: N/A;   CORONARY ARTERY BYPASS GRAFT N/A 01/04/2022   Procedure: CORONARY ARTERY BYPASS GRAFTING (CABG) x  USING ENDOSCOPIC GREATER SAPHENOUS VEIN HARVEST;  Surgeon: Lyn Hollingshead, MD;  Location: [facility] OR;  Service: Open Heart Surgery;  Laterality: N/A;   LEFT HEART CATH AND CORONARY ANGIOGRAPHY N/A 01/02/2022   Procedure: LEFT HEART CATH AND CORONARY ANGIOGRAPHY;  Surgeon: Lennette Bihari, MD;  Location: [facility] INVASIVE [facility];  Service: Cardiovascular;  Laterality: N/A;   LUNG BIOPSY  06/16/1976   PERIPHERAL VASCULAR INTERVENTION Right 11/29/2020   Procedure: PERIPHERAL VASCULAR INTERVENTION;  Surgeon: Iran Ouch, MD;  Location: [facility];  Service: Cardiovascular;  Laterality: Right;  Renal Artery   RENAL ANGIOGRAPHY N/A 11/29/2020   Procedure: RENAL ANGIOGRAPHY;  Surgeon: Iran Ouch, MD;  Location: [facility];  Service: Cardiovascular;   Laterality: N/A;   TEE WITHOUT CARDIOVERSION N/A 01/04/2022   Procedure: TRANSESOPHAGEAL ECHOCARDIOGRAM (TEE);  Surgeon: Lyn Hollingshead, MD;  Location: [facility] OR;  Service: Open Heart Surgery;  Laterality: N/A;   Patient Active Problem List   Diagnosis Date Noted   Memory impairment 03/21/2023   Weight loss, unintentional 12/31/2022   Adjustment insomnia 08/19/2022   Nocturia more than twice per night 08/19/2022   Bilateral sensorineural hearing loss 06/27/2022   Hearing loss of right ear 02/04/2022   Hx of CABG 01/04/2022   Elevated coronary artery calcium score    Chronic kidney disease, stage 3b (HCC) 01/01/2022   Syncope 12/31/2021   Renal artery stenosis (HCC) 09/25/2021   Aortic valve sclerosis 09/25/2021   Snoring 04/13/2021   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   Epistaxis 03/29/2021   Coronary artery disease due to calcified coronary lesion 08/15/2020   Basal cell carcinoma 06/30/2020   History of loop recorder 12/13/2019   Allergic rhinitis 12/08/2019   Chronic cough 11/08/2019   Post-nasal drip 11/08/2019   Multiple joint pain 04/22/2019   Bilateral leg edema 04/22/2019   Vaginal atrophy 01/05/2019   Resting tremor 09/08/2018   Sarcoidosis 05/08/2018   HTN (hypertension) 05/08/2018   Colon polyp 05/08/2018   Family hx of colon cancer 05/08/2018   Cystocele, midline 05/08/2018   Hyperlipidemia 05/08/2018   Vasovagal syncope 05/08/2018   Seborrheic keratosis 05/08/2018   Pseudophakia of both eyes 05/08/2018   GERD (gastroesophageal  reflux disease) 05/08/2018   COPD (chronic obstructive pulmonary disease) (HCC) 05/08/2018   Osteoporosis 12/31/2016     REFERRING PROVIDER:  Rodolph Bong, MD   Alver Sorrow, NP   REFERRING DIAG:  [phone] (ICD-10-CM) - Chronic bilateral low back pain without sciatica  M54.6,G89.29 (ICD-10-CM) - Chronic left-sided thoracic back pain   R06.09 (ICD-10-CM) - Exertional dyspnea              R53.81 (ICD-10-CM) - Physical  deconditioning  THERAPY DIAG:  No diagnosis found.  Rationale for Evaluation and Treatment: Rehabilitation  ONSET DATE: PT order 04/24/2023  SUBJECTIVE:   SUBJECTIVE STATEMENT:   Pt felt fine after prior Rx, though still had some pulling in the R thigh though not bad.  Pt saw neurologist last week for MRI follow-up and balance issues.  She states her balance issues are not related to neurology.  She had a brain MRI and states she didn't have a CVA.  Pt states the neurologist told her that her brain looks good for her age.  She recommended a neuropsych evaluation.  Notes indicate to continue PT.       Pt saw Dr. Denyse Amass for thoracic and lumbar pain. She began having R scapular/thoracic in the fall of 2022 and L scapula/thoracic pain in 2023.  Pt denies any specific MOI.  Her lower back pain began in Oct 2024.  Pt states she was being more inactive due to having heart surgery in 2023.  Pt had lumbar x-rays and chest CT.  MD note indicated chronic thoracic and chronic low back pain.  Pain due to degenerative changes and muscle dysfunction.  MD note indicated plan to proceed with PT.  Pt returned to MD on 4/4.  MD note indicated chronic L rhomboid pain.  MD ordered PT.   Pt saw the sports med doc and had a chest CT.  She states it didn't show anything except arthritis.  Dr. Zollie Pee notes indicated wanting PT to focus more on the upper and low back pain.  MD note indicated to try water therapy or aerobics.  Pt lies in a reclined position in her bed with multiple pillows and reports significantly decreased pain after 1-2 hours.  Pt states she was just allowed to do chest openers this past Feb and March.  She has been doing chest openers on the floor which improves the pain.   Pt has used heat and tylenol already this AM. Pt avoids using L UE as much due to pain.         Pt states cardiology cleared her for yoga and strengthening with caution.  Pt has been performing biceps curls, shoulder flexion,  shoulder abduction with 1 # weight.  PERTINENT HISTORY: Chronic thoracic pain and lumbar pain CAD s/p CABG x 4 on 01/04/2022 Osteoporosis syncope, pulmonary sarcoidosis, implanted loop recorder Arthritis and HTN Memory problems since CABG surgery--she is being followed by neurology.    PAIN:  6/10 L scapular pain current, 10/10 worst, 1/10 best Pt doesn't know what increases her pain.              0/10 R scapular pain, 5-6/10 worst 0/10 lumbar currently " a little tight" ; 9-10/10 worst  Easing factors:  heating pad, tylenol Aggravating factors:  Pt not sure what increases her sx's.   PRECAUTIONS: Other: Implanted loop recorder, osteoporosis, balance, CABG, syncope    WEIGHT BEARING RESTRICTIONS: No  FALLS:  Has patient fallen in last 6 months? Yes. Number of falls  1 on 02/23/2023, syncope  LIVING ENVIRONMENT: Lives with: lives with their spouse Lives in:  1 story home Stairs: 2-4 steps to enter/exit home with rail Has following equipment at home: Single point cane, Environmental consultant - 4 wheeled, and walking sticks  OCCUPATION: Pt is retired  PLOF: Independent  PATIENT GOALS: to walk a straight line, to have improved activity tolerance after a short rest break, be more flexible     OBJECTIVE:  Note: Objective measures were completed at Evaluation unless otherwise noted.  DIAGNOSTIC FINDINGS:  Lumbar X rays on 3/4: FINDINGS: Five lumbar type vertebral bodies are well visualized. Vertebral body height is well maintained. Disc space narrowing is noted at L5-S1. No anterolisthesis is seen. No soft tissue abnormality is noted. Right renal artery stent is seen.   IMPRESSION: Degenerative change without acute abnormality.   CT chest on 3/5: IMPRESSION: *Stable extensive fibro atelectatic fibrocalcific changes of both lungs with significant bilateral bronchiectasis and scar changes involving particularly the upper and mid lung distributions with significant central pulmonary  retraction and traction bronchiectasis and architectural distortion. These findings correlate with chronic interstitial lung disease and correlate with sarcoidosis stage IV. *No new significant findings. No acute infiltrates or consolidations. *Stable multiple calcified mediastinal and hilar lymph nodes.Per MD note, Lumbar X-rays showed DDD worse L5-S1.  No acute fractures are visible.   PATIENT SURVEYS:  LEFS 42/80  COGNITION: Overall cognitive status: Within functional limits for tasks assessed  Pt states she has memory problems and is being followed by neurology.      POSTURE:  Significant kyphosis and FHP   PALPATION:  Pt had no tenderness in palpation of bilat UT and R medial scap mm, thoracic paraspinals and thoracic SP's.  Pt has tenderness in L medial scapula and rhomboids.  Notable muscle spasm in L medial scap mm.  VITALS:  O2:  99%         HR:  77-78  UE MMT R / L:  Shoulder Flexion:  4-/5, tolerates minimal resistance Elbow flexion: 4/5, 4-5/5   UPPER EXTREMITY ROM:  AROM Right eval Left eval  Shoulder flexion 145 111  Shoulder Abduction 124 96                                           (Blank rows = not tested)   FUNCTIONAL TESTS:  5x STS test: 24.05 without UE support 3 MWT:  529 ft.   Pt felt SOB at 1 min 48 sec.  O2:  99%, HR:  104   06/12/23: 5xSTS 13.95sec no UE support  457ft no AD, mild SOB after 2 min, O2 97-99% RA HR mid 80s  GAIT: Comments: slow gait speed, decreased foot clearance on L occasionally   Pt had much difficulty with performing scap retractions.  PT provided cuing, instruction, and demonstration though pt had difficulty with correct form.  PT did not give for HEP.  TREATMENT DATE:  [facility] Adult PT Treatment:                                                DATE: 06/24/23 Vitals at  beginning of Rx:  O2:  99%, HR:  82  Vitals at end of Rx:  HR: 85-86, O2:  99%  Reviewed pt presentation and response to prior Rx.  Nustep x 8 mins with a rest break at 4 mins  L3 x 4 mins:  HR:  89, O2: 99%  L4 x 4 mins:  HR:  93, O2:  99%  Sit to stands 2x10 reps from chair with hands on knees though didn't use hands HR:  96 O2:  99%  Walking  1 min 5 sec (310 ft).  HR:  101-102 which quickly dropped to 96, O2: 97% HR reduced to 82 with rest  Sidestepping x 1 lap at rail without UE support with SBA Slow karaoke with UE support on rail x 2 laps  HR:  90, O2:  99% Tandem Gait with UE support    [facility] Adult PT Treatment:                                                DATE: 06/18/23 Vitals at beginning of Rx:  O2:  99%, HR:  78  Vitals at end of Rx:  HR: 79, O2:  99%  Nustep just LE's at x 6 mins with a rest break at 3 mins  L3 x 3 mins:  HR:  84, O2: 98%  L4 x 3 mins:  HR:  84, O2:  98%  Sit to stands 2x7 reps from chair with hands on knees though didn't use hands  Walking  1 min 55 sec (300 ft).  HR:  86-88, O2: 97-98% 3 min rest--HR improved to 76 and O2 to 99%  1 min 46 sec (300 ft):  HR:  89, O2: 96 %  Sidestepping x 2 laps at rail without UE support with SBA Slow karaoke with UE support on rail x 1 lap  HR:  90, O2:  99% Tandem stance 2x30 sec each with occasional UE support  LAQ 2x10  1# bilat   [facility] Adult PT Treatment:                                                DATE: 06/16/23 Vitals at beginning of Rx:  O2:  99%, HR:  77  Vitals at end of Rx:  HR: 79, O2:  99%  Nustep just LE's at L3 x 5 mins with a rest break at 3 mins  At 3 mins:  HR:  89, O2: 97%  At 5 mins:  HR:  85, O2:  98%  Sit to stands 2x5 reps from chair with hands on knees though didn't use hands  Walking  1 min 5 sec.  HR:  86-88, O2: 99% 2.5 min rest  1 min 8 sec:  HR:  81-82, O2: 99 %  Sidestepping x 2 laps at rail without UE support with SBA Tandem stance 2x30 sec  each with  occasional UE support  LAQ x 10 AROM bilat, 1# bilat     [facility] Adult PT Treatment:                                                DATE: 06/12/23 Vitals mid session: HR 72-75,SpO2 99% RA  Therapeutic Activity: Warm up walk 3x30sec w/ 90 sec rest (slow pace) Sit to stand x5 (separate from 5xSTS)cues for pacing 5xSTS + education + education 3 step weight shifting practice x6 BIL cues for mechanics andpacing Continued education/discussion re: activity modification and pacing strategies     [facility] Adult PT Treatment:                                                DATE: 06/06/23 Neuromuscular re-ed: Tandem stance 3x30sec BIL CGA Slow karaokes along mat 2 laps w/ seated rest Requires seated rest between activities given exertional fatigue  Therapeutic Activity: 2x45sec ambulation w/ rest Continued education/discussion re: activity modification, pacing, use of RPE scale     PATIENT EDUCATION:  Education details:  PT spent time answering pt's questions.     rationale for interventions, vitals, relevant anatomy, HEP  Person educated: Patient Education method: Explanation, Demonstration, Tactile cues, Verbal cues Education comprehension: verbalized understanding, returned demonstration, verbal cues required, tactile cues required, and needs further education     HOME EXERCISE PROGRAM: Access Code: [email or web address] URL: [email or web address] Date: 05/29/2023 Prepared by: Fransisco Hertz  Exercises - Standing Tandem Balance with Counter Support  - 2-3 x daily - 1-2 sets - 1-2 reps - 30sec hold - Side Stepping with Counter Support  - 2-3 x daily - 1 sets - 8 reps - Backward Walking with Counter Support  - 2-3 x daily - 1 sets - 8 reps  ASSESSMENT:  CLINICAL IMPRESSION: PT provided appropriate rest periods t/o Rx and assessed vitals t/o Rx.  Pt's HR increased appropriately with activity and decreased appropriately with rest.  Pt had increased HR with walk today though  quickly decreased with resting.  Pt had good O2 levels t/o Rx.  Pt increased repetitions of sit to stands.  Pt performed exercises well and requires UE support with tandem gait and karaoke.  She has seen neurologist and sports med MD recently.  Sports med MD wants her to be seen for her back pain more.  There is an order in [facility].  She responded well to Rx having no c/o's after Rx.  Pt should benefit from cont skilled PT to address ongoing goals and improve tolerance to activity, functional endurance, strength, and mobility.    Postural deficits Decreased L shoulder elevation ROM       OBJECTIVE IMPAIRMENTS: decreased activity tolerance, decreased balance, decreased endurance, decreased mobility, difficulty walking, decreased strength, and pain.   ACTIVITY LIMITATIONS: standing, squatting, transfers, and locomotion level  PARTICIPATION LIMITATIONS: cleaning, shopping, and community activity  PERSONAL FACTORS: 3+ comorbidities: osteoporosis, CABG, arthritis, syncope, pulmonary sarcoidosis  are also affecting patient's functional outcome.   REHAB POTENTIAL: Good  CLINICAL DECISION MAKING: Stable/uncomplicated  EVALUATION COMPLEXITY: Low   GOALS:  SHORT TERM GOALS: Target date: 06/11/2023  Pt will be independent and compliant with HEP for improved strength, tolerance to activity, functional  endurance, pain, and mobility.   Baseline: 06/12/23: HEP ongoing Goal status: ONGOING  2.  Pt will report at least a 25% improvement in tolerance to activity and her functional mobility.  Baseline:  06/12/23: reports 35-40% improvement, particularly with housework Goal status: MET  3.  Pt will perform 5x STS test in no > than 19 sec for improved functional LE strength and performance of transfers.   Baseline:  06/12/23: 13.95sec no UE support Goal status: MET  4.  Pt will ambulate entire 6 MWT test.   Baseline:  06/12/23: Goal status: ONGOING   LONG TERM GOALS: Target date:  07/02/2023  Pt will report at least a 70% improvement in her tolerance with daily activities and functional endurance with mobility.    Baseline:  Goal status: INITIAL  2.  Pt will increase ambulation distance by at least 150 ft on 3 MWT.  Baseline:  Goal status: INITIAL  3.  Pt will report increased ambulation distance with reduced fatigue and SOB. Baseline:  Goal status: INITIAL  4.  Pt will perform 5x STS test in < than 16 sec for improved functional LE strength and performance of transfers.  Baseline:  Goal status: INITIAL  5.  Pt will demo improved bilat LE strength to 4+/5 in hip flexion and 5/5 in knee extension and at least 6-8# increase in abd for improved performance of functional mobility and performance of household chores. Baseline:  Goal status: INITIAL    PLAN:  PT FREQUENCY: 2x/week  PT DURATION: 6 weeks  PLANNED INTERVENTIONS: [id number]- PT Re-evaluation, 97110-Therapeutic exercises, 97530- Therapeutic activity, [id number]- Neuromuscular re-education, 97535- Self Care, [id number]- Manual therapy, [id number]- Gait training, [id number]- Aquatic Therapy, [phone]- Ultrasound, Patient/Family education, Balance training, Stair training, Taping, Dry Needling, Cryotherapy, and Moist heat  PLAN FOR NEXT SESSION:  review/update HEP PRN. Recommend STS practice, LE strengthening. Continuing with balance. Monitoring activity tolerance via borg dyspnea scale and/or vitals. NO e-stim Pt has an order for low back and L sided thoracic pain from Dr. Denyse Amass.  Will evaluate next Rx.    Audie Clear III PT, DPT 06/25/23 7:17 AM

## 2023-06-26 ENCOUNTER — Encounter (HOSPITAL_BASED_OUTPATIENT_CLINIC_OR_DEPARTMENT_OTHER): Payer: Self-pay | Admitting: Physical Therapy

## 2023-06-29 NOTE — Therapy (Signed)
 OUTPATIENT PHYSICAL THERAPY TREATMENT / Lumbar and Thoracic Evaluation   Patient Name: Kristen Suarez MRN: [id number] DOB:Mar 25, 1945, 78 y.o., female Today's Date: 06/30/2023  END OF SESSION:  PT End of Session - 06/30/23 1200     Visit Number 9    Number of Visits 20    Date for PT Re-Evaluation 07/02/23    Authorization Type MCR A and B    PT Start Time 1105    PT Stop Time 1151    PT Time Calculation (min) 46 min    Activity Tolerance Patient tolerated treatment well    Behavior During Therapy [facility] for tasks assessed/performed                    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/1976   Past Surgical History:  Procedure Laterality Date   ABDOMINAL HYSTERECTOMY  1987   BASAL CELL CARCINOMA EXCISION  06/30/2020   nose   CARDIAC CATHETERIZATION     CLIPPING OF ATRIAL APPENDAGE N/A 01/04/2022   Procedure: CLIPPING OF ATRIAL APPENDAGE USING SIZE 40 ATRICURE ATRIAL CLIP;  Surgeon: Eleanora Grew, MD;  Location: [facility] OR;  Service: Open Heart Surgery;  Laterality: N/A;   CORONARY ARTERY BYPASS GRAFT N/A 01/04/2022   Procedure: CORONARY ARTERY BYPASS GRAFTING (CABG) x  USING ENDOSCOPIC GREATER SAPHENOUS VEIN HARVEST;  Surgeon: Eleanora Grew, MD;  Location: [facility] OR;  Service: Open Heart Surgery;  Laterality: N/A;   LEFT HEART CATH AND CORONARY ANGIOGRAPHY N/A 01/02/2022   Procedure: LEFT HEART CATH AND CORONARY ANGIOGRAPHY;  Surgeon: Millicent Ally, MD;  Location: [facility] INVASIVE CV LAB;  Service: Cardiovascular;  Laterality: N/A;   LUNG BIOPSY  06/16/1976   PERIPHERAL  VASCULAR INTERVENTION Right 11/29/2020   Procedure: PERIPHERAL VASCULAR INTERVENTION;  Surgeon: Wenona Hamilton, MD;  Location: [facility] INVASIVE CV LAB;  Service: Cardiovascular;  Laterality: Right;  Renal Artery   RENAL ANGIOGRAPHY N/A 11/29/2020   Procedure: RENAL ANGIOGRAPHY;  Surgeon: Wenona Hamilton, MD;  Location: [facility] INVASIVE CV LAB;  Service: Cardiovascular;  Laterality: N/A;   TEE WITHOUT CARDIOVERSION N/A 01/04/2022   Procedure: TRANSESOPHAGEAL ECHOCARDIOGRAM (TEE);  Surgeon: Eleanora Grew, MD;  Location: [facility] OR;  Service: Open Heart Surgery;  Laterality: N/A;   Patient Active Problem List   Diagnosis Date Noted   Memory impairment 03/21/2023   Weight loss, unintentional 12/31/2022   Adjustment insomnia 08/19/2022   Nocturia more than twice per night 08/19/2022   Bilateral sensorineural hearing loss 06/27/2022   Hearing loss of right ear 02/04/2022   Hx of CABG 01/04/2022   Elevated coronary artery calcium score    Chronic kidney disease, stage 3b (HCC) 01/01/2022   Syncope 12/31/2021   Renal artery stenosis (HCC) 09/25/2021   Aortic valve sclerosis 09/25/2021   Snoring 04/13/2021   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   Epistaxis 03/29/2021   Coronary artery disease due to calcified coronary lesion 08/15/2020   Basal cell carcinoma 06/30/2020   History of  loop recorder 12/13/2019   Allergic rhinitis 12/08/2019   Chronic cough 11/08/2019   Post-nasal drip 11/08/2019   Multiple joint pain 04/22/2019   Bilateral leg edema 04/22/2019   Vaginal atrophy 01/05/2019   Resting tremor 09/08/2018   Sarcoidosis 05/08/2018   HTN (hypertension) 05/08/2018   Colon polyp 05/08/2018   Family hx of colon cancer 05/08/2018   Cystocele, midline 05/08/2018   Hyperlipidemia 05/08/2018   Vasovagal syncope 05/08/2018   Seborrheic keratosis 05/08/2018   Pseudophakia of both eyes 05/08/2018   GERD (gastroesophageal reflux disease) 05/08/2018   COPD (chronic obstructive pulmonary disease)  (HCC) 05/08/2018   Osteoporosis 12/31/2016     REFERRING PROVIDER:  Syliva Even, MD   Clearnce Curia, NP   REFERRING DIAG:  [phone] (ICD-10-CM) - Chronic bilateral low back pain without sciatica  M54.6,G89.29 (ICD-10-CM) - Chronic left-sided thoracic back pain   R06.09 (ICD-10-CM) - Exertional dyspnea              R53.81 (ICD-10-CM) - Physical deconditioning  THERAPY DIAG:  Pain in thoracic spine  Other low back pain  Muscle weakness (generalized)  Abnormal posture  Difficulty in walking, not elsewhere classified  Rationale for Evaluation and Treatment: Rehabilitation  ONSET DATE: PT order 04/24/2023  SUBJECTIVE:   SUBJECTIVE STATEMENT:   Pt states she did 15 mins on the Nustep and 5 minutes on the recumbent bike and her legs were very tired.  Pt states she managed getting around her garden well with both walking sticks.  Pt had no aches and pain and no adverse effects after prior Rx.  Pt states she has seen subtle changes though no significant improvement in her energy and strength.  Pt reports she has been performing standing shoulder extension with YTB at home.  Pt is getting a massage tomorrow.   PERTINENT HISTORY: Chronic thoracic pain and lumbar pain CAD s/p CABG x 4 on 01/04/2022 Osteoporosis syncope, pulmonary sarcoidosis, implanted loop recorder Arthritis and HTN Memory problems since CABG surgery--she is being followed by neurology.    PAIN:  4/10 L scapular pain current, 10/10 worst, 1/10 best Pt doesn't know what increases her pain.              0/10 R scapular pain, 5-6/10 worst 4/10 lumbar currently ; 9-10/10 worst  Easing factors:  heating pad, tylenol Aggravating factors:  Pt not sure what increases her sx's.   PRECAUTIONS: Other: Implanted loop recorder, osteoporosis, balance, CABG, syncope    WEIGHT BEARING RESTRICTIONS: No  FALLS:  Has patient fallen in last 6 months? Yes. Number of falls 1 on 02/23/2023, syncope  LIVING  ENVIRONMENT: Lives with: lives with their spouse Lives in:  1 story home Stairs: 2-4 steps to enter/exit home with rail Has following equipment at home: Single point cane, Walker - 4 wheeled, and walking sticks  OCCUPATION: Pt is retired  PLOF: Independent  PATIENT GOALS: to walk a straight line, to have improved activity tolerance after a short rest break, be more flexible     OBJECTIVE:  Note: Objective measures were completed at Evaluation unless otherwise noted.  DIAGNOSTIC FINDINGS:  Lumbar X rays on 3/4: FINDINGS: Five lumbar type vertebral bodies are well visualized. Vertebral body height is well maintained. Disc space narrowing is noted at L5-S1. No anterolisthesis is seen. No soft tissue abnormality is noted. Right renal artery stent is seen.   IMPRESSION: Degenerative change without acute abnormality.   CT chest on 3/5: IMPRESSION: *Stable extensive fibro atelectatic fibrocalcific changes of  both lungs with significant bilateral bronchiectasis and scar changes involving particularly the upper and mid lung distributions with significant central pulmonary retraction and traction bronchiectasis and architectural distortion. These findings correlate with chronic interstitial lung disease and correlate with sarcoidosis stage IV. *No new significant findings. No acute infiltrates or consolidations. *Stable multiple calcified mediastinal and hilar lymph nodes.Per MD note, Lumbar X-rays showed DDD worse L5-S1.  No acute fractures are visible.   TREATMENT DATE:  4/14  Modified Oswestry:  24= 48%  POSTURE:  Significant kyphosis and FHP   VITALS:   At beginning of Rx:      O2:  99%         HR:  80 At end of Rx:         O2:  99%        HR:  81-82     Ther-Ex: Nustep just LE's at x 6 mins with a rest break at 3 mins            L3 x 3 mins:  HR:  93-94 which quickly decreased, O2: 97%            L4 x 3 mins:  HR:  96-97 which quickly decreased, O2:  96%  though did decrease to 94%.   O2 increased to 99% as she rested  Shoulder pulleys 2x10     Neuro Re-ed: scap retraction 2x10 Rows with theraband x 10 reps and x 10 reps with scap retraction Standing shoulder extension with YTB 2x10   Pt received a HEP handout and was educated in correct form and appropriate frequency.  PT instructed she should not have pain with HEP.        Manual Therapy: STM to medial scap mm in sitting.                                                                                                  PATIENT EDUCATION:  Education details:  posture, exercise form, rationale for interventions, vitals, relevant anatomy, HEP, what to expect next Rx, and POC.  Person educated: Patient Education method: Explanation, Demonstration, Tactile cues, Verbal cues Education comprehension: verbalized understanding, returned demonstration, verbal cues required, tactile cues required, and needs further education     HOME EXERCISE PROGRAM: Access Code: [email or web address] URL: [email or web address] Date: 05/29/2023 Prepared by: Mayme Spearman  Exercises - Standing Tandem Balance with Counter Support  - 2-3 x daily - 1-2 sets - 1-2 reps - 30sec hold - Side Stepping with Counter Support  - 2-3 x daily - 1 sets - 8 reps - Backward Walking with Counter Support  - 2-3 x daily - 1 sets - 8 reps  Updated HEP: - Seated Scapular Retraction  - 2 x daily - 7 x weekly - 1-2 sets - 10 reps - Standing Shoulder Row with Anchored Resistance  - 1 x daily - 3-4 x weekly - 2 sets - 10 reps  ASSESSMENT:  CLINICAL IMPRESSION: Pt reports no significant improvement in her strength or energy levels.  PT took vitals t/o Rx.  She did have an appropriate increase in HR  with exercises and an appropriate decrease in HR with resting.  Pt performed scapular strengthening exercises to facilitate improved scapular retraction and promote improved posture and positioning.  PT provided cuing and instruction  in correct form with scap retractions, and she demonstrated much improved form.  Pt performed exercises well with cuing and instruction in correct form.  PT gave pt a HEP handout and pt demonstrates good understanding of HEP.  Pt tolerated treatment well and reports no increased pain after Rx.  She stated her lower back felt looser, and not as tight as the beginning of Rx.   Pt should benefit from continued skilled PT services to improve posture, pain, strength, ROM, and function.        OBJECTIVE IMPAIRMENTS: decreased activity tolerance, decreased balance, decreased endurance, decreased mobility, difficulty walking, decreased strength, and pain.   ACTIVITY LIMITATIONS: standing, squatting, transfers, and locomotion level  PARTICIPATION LIMITATIONS: cleaning, shopping, and community activity  PERSONAL FACTORS: 3+ comorbidities: osteoporosis, CABG, arthritis, syncope, pulmonary sarcoidosis  are also affecting patient's functional outcome.   REHAB POTENTIAL: Good  CLINICAL DECISION MAKING: Stable/uncomplicated  EVALUATION COMPLEXITY: Low   GOALS:  SHORT TERM GOALS: Target date: 06/11/2023  Pt will be independent and compliant with HEP for improved strength, tolerance to activity, functional endurance, pain, and mobility.   Baseline: 06/12/23: HEP ongoing Goal status: ONGOING  2.  Pt will report at least a 25% improvement in tolerance to activity and her functional mobility.  Baseline:  06/12/23: reports 35-40% improvement, particularly with housework Goal status: MET  3.  Pt will perform 5x STS test in no > than 19 sec for improved functional LE strength and performance of transfers.   Baseline:  06/12/23: 13.95sec no UE support Goal status: MET  4.  Pt will ambulate entire 6 MWT test.   Baseline:  06/12/23: Goal status: ONGOING  5.  Pt will report at least a 25% improvement in thoracic and lumbar pain and sx's overall.  Goal status:  INITIAL  Target date:   07/16/2023    LONG TERM GOALS: Target date: 07/02/2023  Pt will report at least a 70% improvement in her tolerance with daily activities and functional endurance with mobility.    Baseline:  Goal status: INITIAL  2.  Pt will increase ambulation distance by at least 150 ft on 3 MWT.  Baseline:  Goal status: INITIAL  3.  Pt will report increased ambulation distance with reduced fatigue and SOB. Baseline:  Goal status: INITIAL  4.  Pt will perform 5x STS test in < than 16 sec for improved functional LE strength and performance of transfers.  Baseline:  Goal status: INITIAL  5.  Pt will demo improved bilat LE strength to 4+/5 in hip flexion and 5/5 in knee extension and at least 6-8# increase in abd for improved performance of functional mobility and performance of household chores. Baseline:  Goal status: INITIAL  6.  Pt will report at least a 70% improvement in her thoracic and lumbar pain overall.    Goal status:  INITIAL  Target date:  08/06/2023  7.  Pt will demo improved L shoulder flexion/abd AROM by 15/10 deg for improved UE elevation and reaching.   Goal status:  INITIAL  Target date:  08/06/2023  8.  Pt will report she is able to use her L UE more with daily activities by at least 50% without significant pain.   Goal status:  INITIAL  Target date:  08/06/2023  PLAN:  PT FREQUENCY: 2x/week  PT DURATION: 6 weeks  PLANNED INTERVENTIONS: 97164- PT Re-evaluation, 97110-Therapeutic exercises, 97530- Therapeutic activity, [id number]- Neuromuscular re-education, 97535- Self Care, 16109- Manual therapy, [id number]- Gait training, [phone]- Aquatic Therapy, [phone]- Ultrasound, Patient/Family education, Balance training, Stair training, Taping, Dry Needling, Cryotherapy, and Moist heat  PLAN FOR NEXT SESSION:  review/update HEP PRN.  Cont with gait, ambulation, balance, functional endurance, and  LE strengthening.  Monitoring activity tolerance via borg dyspnea scale and/or vitals. NO  e-stim STM to medial scap mm and thoracic and lumbar paraspinals.  Work on scap retraction form.  Rows with theraband.  Shoulder pulleys   Trina Fujita III PT, DPT 06/30/23 12:13 PM

## 2023-06-30 ENCOUNTER — Ambulatory Visit (HOSPITAL_BASED_OUTPATIENT_CLINIC_OR_DEPARTMENT_OTHER): Admitting: Physical Therapy

## 2023-06-30 ENCOUNTER — Encounter (HOSPITAL_BASED_OUTPATIENT_CLINIC_OR_DEPARTMENT_OTHER): Payer: Self-pay | Admitting: Physical Therapy

## 2023-06-30 DIAGNOSIS — R262 Difficulty in walking, not elsewhere classified: Secondary | ICD-10-CM

## 2023-06-30 DIAGNOSIS — M5459 Other low back pain: Secondary | ICD-10-CM

## 2023-06-30 DIAGNOSIS — R293 Abnormal posture: Secondary | ICD-10-CM

## 2023-06-30 DIAGNOSIS — M6281 Muscle weakness (generalized): Secondary | ICD-10-CM

## 2023-06-30 DIAGNOSIS — M546 Pain in thoracic spine: Secondary | ICD-10-CM

## 2023-07-02 ENCOUNTER — Ambulatory Visit: Payer: Medicare Other | Admitting: Nurse Practitioner

## 2023-07-02 ENCOUNTER — Telehealth: Payer: Self-pay

## 2023-07-02 ENCOUNTER — Encounter: Payer: Self-pay | Admitting: Nurse Practitioner

## 2023-07-02 VITALS — BP 136/69 | HR 79 | Temp 98.7°F | Ht 62.0 in | Wt 129.8 lb

## 2023-07-02 DIAGNOSIS — N1832 Chronic kidney disease, stage 3b: Secondary | ICD-10-CM

## 2023-07-02 DIAGNOSIS — R7303 Prediabetes: Secondary | ICD-10-CM

## 2023-07-02 DIAGNOSIS — I480 Paroxysmal atrial fibrillation: Secondary | ICD-10-CM

## 2023-07-02 DIAGNOSIS — I10 Essential (primary) hypertension: Secondary | ICD-10-CM | POA: Diagnosis not present

## 2023-07-02 DIAGNOSIS — E782 Mixed hyperlipidemia: Secondary | ICD-10-CM

## 2023-07-02 DIAGNOSIS — F5102 Adjustment insomnia: Secondary | ICD-10-CM

## 2023-07-02 MED ORDER — TRAZODONE HCL 50 MG PO TABS: 25.0000 mg | ORAL_TABLET | Freq: Every day | ORAL | 3 refills | Status: DC

## 2023-07-02 NOTE — Assessment & Plan Note (Signed)
 Stable weight Wt Readings from Last 3 Encounters:  07/02/23 129 lb 12.8 oz (58.9 kg)  06/20/23 130 lb (59 kg)  06/19/23 129 lb (58.5 kg)

## 2023-07-02 NOTE — Assessment & Plan Note (Signed)
 Normal rhythm and rate today. Current use of metoprolol

## 2023-07-02 NOTE — Assessment & Plan Note (Signed)
 Stable home BP (120s-130s/60s-70s) Current use of amlodipine 5mg  in Am and metoprolol 25mg  XR in PM Reports she is compliant with low sodium diet and med doses Irbesartan, Spironolactone and hydrochlorothiazide discontinued in past due to hypotension. Currently under the care of Dr. Theodis Fiscal BP Readings from Last 3 Encounters:  07/02/23 136/69  06/20/23 (!) 142/70  06/19/23 (!) 170/81

## 2023-07-02 NOTE — Telephone Encounter (Signed)
 Pt brought application to OV on 07/02/23. Signed by Kathrene Parents and given to pt. Copied sent to HIM to be scanned into pt chart.

## 2023-07-02 NOTE — Patient Instructions (Signed)
 Schedule fasting lab appt. Need to be fasting 8hrs prior to blood draw. Ok to drink water and take BP meds. Maintain Heart healthy diet and daily exercise. Maintain current medications.

## 2023-07-02 NOTE — Progress Notes (Signed)
 Established Patient Visit  Patient: Kristen Suarez   DOB: Mar 12, 1946   78 y.o. Female  MRN: [id number] Visit Date: 07/02/2023  Subjective:    Chief Complaint  Patient presents with   Hypertension    Home BP reading provided today   Impaired Fasting Glucose   Hyperlipidemia    Not fasting today. Will return for blood work. Pt also has handicap placard application to be completed today.   PAF (paroxysmal atrial fibrillation) (HCC) Normal rhythm and rate today. Current use of metoprolol  Chronic kidney disease, stage 3b (HCC) BP at goal Repeat CMP  Adjustment insomnia Improved with use of trazodone 25mg  at hs Refill sent  Weight loss, unintentional Stable weight Wt Readings from Last 3 Encounters:  07/02/23 129 lb 12.8 oz (58.9 kg)  06/20/23 130 lb (59 kg)  06/19/23 129 lb (58.5 kg)     HTN (hypertension) Stable home BP (120s-130s/60s-70s) Current use of amlodipine 5mg  in Am and metoprolol 25mg  XR in PM Reports she is compliant with low sodium diet and med doses Irbesartan, Spironolactone and hydrochlorothiazide discontinued in past due to hypotension. Currently under the care of Dr. Duke Salvia BP Readings from Last 3 Encounters:  07/02/23 136/69  06/20/23 (!) 142/70  06/19/23 (!) 170/81      Reviewed medical, surgical, and social history today  Medications: Outpatient Medications Prior to Visit  Medication Sig   acetaminophen (TYLENOL) 500 MG tablet Take 500 mg by mouth every 6 (six) hours as needed.   albuterol (PROVENTIL) (2.5 MG/3ML) 0.083% nebulizer solution TAKE 3 MLS (2.5 MG TOTAL) BY NEBULIZATION EVERY 4 (FOUR) HOURS AS NEEDED.   amLODipine (NORVASC) 10 MG tablet Take 1 tablet (10 mg total) by mouth daily.   aspirin EC 81 MG tablet Take 1 tablet (81 mg total) by mouth daily. Swallow whole.   Budeson-Glycopyrrol-Formoterol (BREZTRI AEROSPHERE) 160-9-4.8 MCG/ACT AERO Inhale 2 puffs into the lungs in the morning and at bedtime.   calcium  carbonate (CALCIUM 600) 600 MG TABS tablet Take 600 mg by mouth daily with breakfast.   cetirizine (ZYRTEC) 10 MG tablet Take 10 mg by mouth daily.   CRANBERRY PO Take by mouth.   cyanocobalamin 1000 MCG tablet Take 1,000 mcg by mouth daily.   denosumab (PROLIA) 60 MG/ML SOSY injection Inject 60 mg into the skin every 6 (six) months.   docusate sodium (COLACE) 100 MG capsule Take 100 mg by mouth daily.    Evolocumab (REPATHA SURECLICK) 140 MG/ML SOAJ INJECT 140 MG INTO THE SKIN EVERY 14 (FOURTEEN) DAYS.   fluticasone (FLONASE) 50 MCG/ACT nasal spray SPRAY 2 SPRAYS INTO EACH NOSTRIL EVERY DAY   guaiFENesin (MUCINEX PO) Take by mouth 2 (two) times daily.   MAGNESIUM PO Take by mouth every other day.   metoprolol succinate (TOPROL XL) 25 MG 24 hr tablet Take 1 tablet (25 mg total) by mouth daily.   Multiple Vitamins-Minerals (MULTIVITAL PO) Take 1 tablet by mouth daily.    pantoprazole (PROTONIX) 40 MG tablet TAKE 1 TABLET BY MOUTH EVERY DAY   Probiotic Product (PROBIOTIC DAILY PO) Take 1 tablet by mouth daily.    rosuvastatin (CRESTOR) 40 MG tablet TAKE 1 TABLET BY MOUTH EVERY DAY   sodium chloride (OCEAN) 0.65 % SOLN nasal spray Place 1 spray into both nostrils as needed for congestion.   valsartan (DIOVAN) 40 MG tablet Take 1 tablet (40 mg total) by mouth daily.   [DISCONTINUED]  traZODone (DESYREL) 50 MG tablet TAKE 1/2 TABLET DAILY AT BEDTIME AS NEEDED   azithromycin (ZITHROMAX Z-PAK) 250 MG tablet Take 2 tablets day 1 and then 1 daily for 4 days (Patient not taking: Reported on 07/02/2023)   predniSONE (DELTASONE) 20 MG tablet Take 1 tablet (20 mg total) by mouth daily with breakfast. (Patient not taking: Reported on 07/02/2023)   Facility-Administered Medications Prior to Visit  Medication Dose Route Frequency Provider   [START ON 11/12/2023] denosumab (PROLIA) injection 60 mg  60 mg Subcutaneous Once Kremer, William Alfred, MD   Reviewed past medical and social history.   ROS per HPI  above      Objective:  BP 136/69 Comment: Average Home BP Reading. List will be scanned into chart.  Pulse 79   Temp 98.7 F (37.1 C) (Temporal)   Ht 5\' 2"  (1.575 m)   Wt 129 lb 12.8 oz (58.9 kg)   SpO2 97%   BMI 23.74 kg/m      Physical Exam Cardiovascular:     Rate and Rhythm: Normal rate and regular rhythm.     Pulses: Normal pulses.     Heart sounds: Normal heart sounds.  Pulmonary:     Effort: Pulmonary effort is normal.     Breath sounds: Normal breath sounds.  Musculoskeletal:     Right lower leg: Edema present.     Left lower leg: Edema present.  Neurological:     Mental Status: She is alert and oriented to person, place, and time.  Psychiatric:        Mood and Affect: Mood normal.        Behavior: Behavior normal.        Thought Content: Thought content normal.     No results found for any visits on 07/02/23.    Assessment & Plan:    Problem List Items Addressed This Visit     Adjustment insomnia   Improved with use of trazodone 25mg  at hs Refill sent      Relevant Medications   traZODone (DESYREL) 50 MG tablet   Chronic kidney disease, stage 3b (HCC) (Chronic)   BP at goal Repeat CMP      Relevant Orders   Comprehensive metabolic panel with GFR   HTN (hypertension)   Stable home BP (120s-130s/60s-70s) Current use of amlodipine 5mg  in Am and metoprolol 25mg  XR in PM Reports she is compliant with low sodium diet and med doses Irbesartan, Spironolactone and hydrochlorothiazide discontinued in past due to hypotension. Currently under the care of Dr. Theodis Fiscal BP Readings from Last 3 Encounters:  07/02/23 136/69  06/20/23 (!) 142/70  06/19/23 (!) 170/81         Hyperlipidemia   Relevant Orders   Lipid panel   PAF (paroxysmal atrial fibrillation) (HCC) - Primary   Normal rhythm and rate today. Current use of metoprolol      Other Visit Diagnoses       Prediabetes       Relevant Orders   Hemoglobin A1c      Return in about 6  months (around 01/01/2024) for HTN, hyperlipidemia (fasting).     Kathrene Parents, NP

## 2023-07-02 NOTE — Assessment & Plan Note (Signed)
 BP at goal Repeat CMP

## 2023-07-02 NOTE — Assessment & Plan Note (Signed)
 Improved with use of trazodone 25mg  at hs Refill sent

## 2023-07-03 ENCOUNTER — Encounter: Payer: Self-pay | Admitting: Nurse Practitioner

## 2023-07-03 ENCOUNTER — Encounter (HOSPITAL_BASED_OUTPATIENT_CLINIC_OR_DEPARTMENT_OTHER): Payer: Self-pay | Admitting: Physical Therapy

## 2023-07-03 ENCOUNTER — Other Ambulatory Visit (INDEPENDENT_AMBULATORY_CARE_PROVIDER_SITE_OTHER)

## 2023-07-03 ENCOUNTER — Ambulatory Visit (HOSPITAL_BASED_OUTPATIENT_CLINIC_OR_DEPARTMENT_OTHER): Admitting: Physical Therapy

## 2023-07-03 DIAGNOSIS — M6281 Muscle weakness (generalized): Secondary | ICD-10-CM

## 2023-07-03 DIAGNOSIS — R262 Difficulty in walking, not elsewhere classified: Secondary | ICD-10-CM

## 2023-07-03 DIAGNOSIS — R7303 Prediabetes: Secondary | ICD-10-CM | POA: Diagnosis not present

## 2023-07-03 DIAGNOSIS — M546 Pain in thoracic spine: Secondary | ICD-10-CM

## 2023-07-03 DIAGNOSIS — E782 Mixed hyperlipidemia: Secondary | ICD-10-CM

## 2023-07-03 DIAGNOSIS — N1832 Chronic kidney disease, stage 3b: Secondary | ICD-10-CM

## 2023-07-03 DIAGNOSIS — M5459 Other low back pain: Secondary | ICD-10-CM

## 2023-07-03 LAB — HEMOGLOBIN A1C: Hgb A1c MFr Bld: 5.8 % (ref 4.6–6.5)

## 2023-07-03 LAB — COMPREHENSIVE METABOLIC PANEL WITH GFR
ALT: 19 U/L (ref 0–35)
AST: 22 U/L (ref 0–37)
Albumin: 4.6 g/dL (ref 3.5–5.2)
Alkaline Phosphatase: 89 U/L (ref 39–117)
BUN: 20 mg/dL (ref 6–23)
CO2: 32 meq/L (ref 19–32)
Calcium: 9.1 mg/dL (ref 8.4–10.5)
Chloride: 102 meq/L (ref 96–112)
Creatinine, Ser: 0.91 mg/dL (ref 0.40–1.20)
GFR: 60.67 mL/min (ref >60.00)
Glucose, Bld: 100 mg/dL — ABNORMAL HIGH (ref 70–99)
Potassium: 3.7 meq/L (ref 3.5–5.1)
Sodium: 140 meq/L (ref 135–145)
Total Bilirubin: 0.4 mg/dL (ref 0.2–1.2)
Total Protein: 7 g/dL (ref 6.0–8.3)

## 2023-07-03 LAB — LIPID PANEL
Cholesterol: 119 mg/dL (ref 0–200)
HDL: 58.9 mg/dL (ref >39.00)
LDL Cholesterol: 45 mg/dL (ref 0–99)
NonHDL: 60.43
Total CHOL/HDL Ratio: 2
Triglycerides: 75 mg/dL (ref 0.0–149.0)
VLDL: 15 mg/dL (ref 0.0–40.0)

## 2023-07-03 NOTE — Therapy (Signed)
 OUTPATIENT PHYSICAL THERAPY TREATMENT   Progress Note Reporting Period 05/21/2023 to 07/03/2023  See note below for Objective Data and Assessment of Progress/Goals.       Patient Name: Kristen Suarez MRN: [id number] DOB:Jul 13, 1945, 78 y.o., female Today's Date: 07/03/2023  END OF SESSION:  PT End of Session - 07/03/23 1036     Visit Number 10    Number of Visits 20    Date for PT Re-Evaluation 08/07/23    Authorization Type MCR A and B    PT Start Time 1032    PT Stop Time 1110    PT Time Calculation (min) 38 min    Activity Tolerance Patient tolerated treatment well    Behavior During Therapy [facility] for tasks assessed/performed                    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/1976   Weight loss, unintentional 12/31/2022   Past Surgical History:  Procedure Laterality Date   ABDOMINAL HYSTERECTOMY  1987   BASAL CELL CARCINOMA EXCISION  06/30/2020   nose   CARDIAC CATHETERIZATION     CLIPPING OF ATRIAL APPENDAGE N/A 01/04/2022   Procedure: CLIPPING OF ATRIAL APPENDAGE USING SIZE 40 ATRICURE ATRIAL CLIP;  Surgeon: Eleanora Grew, MD;  Location: [facility] OR;  Service: Open Heart Surgery;  Laterality: N/A;   CORONARY ARTERY BYPASS GRAFT N/A 01/04/2022   Procedure: CORONARY ARTERY BYPASS GRAFTING (CABG) x  USING ENDOSCOPIC GREATER SAPHENOUS VEIN HARVEST;  Surgeon: Eleanora Grew, MD;  Location: [facility] OR;  Service: Open Heart Surgery;  Laterality: N/A;   LEFT HEART CATH AND CORONARY ANGIOGRAPHY N/A 01/02/2022   Procedure: LEFT HEART CATH AND CORONARY ANGIOGRAPHY;   Surgeon: Millicent Ally, MD;  Location: [facility] INVASIVE CV LAB;  Service: Cardiovascular;  Laterality: N/A;   LUNG BIOPSY  06/16/1976   PERIPHERAL VASCULAR INTERVENTION Right 11/29/2020   Procedure: PERIPHERAL VASCULAR INTERVENTION;  Surgeon: Wenona Hamilton, MD;  Location: [facility] INVASIVE CV LAB;  Service: Cardiovascular;  Laterality: Right;  Renal Artery   RENAL ANGIOGRAPHY N/A 11/29/2020   Procedure: RENAL ANGIOGRAPHY;  Surgeon: Wenona Hamilton, MD;  Location: [facility] INVASIVE CV LAB;  Service: Cardiovascular;  Laterality: N/A;   TEE WITHOUT CARDIOVERSION N/A 01/04/2022   Procedure: TRANSESOPHAGEAL ECHOCARDIOGRAM (TEE);  Surgeon: Eleanora Grew, MD;  Location: [facility] OR;  Service: Open Heart Surgery;  Laterality: N/A;   Patient Active Problem List   Diagnosis Date Noted   PAF (paroxysmal atrial fibrillation) (HCC) 07/02/2023   Memory impairment 03/21/2023   Adjustment insomnia 08/19/2022   Nocturia more than twice per night 08/19/2022   Bilateral sensorineural hearing loss 06/27/2022   Hearing loss of right ear 02/04/2022   Hx of CABG 01/04/2022   Elevated coronary artery calcium score    Chronic kidney disease, stage 3b (HCC) 01/01/2022   Syncope 12/31/2021   Renal artery stenosis (HCC) 09/25/2021   Aortic valve sclerosis 09/25/2021   Snoring 04/13/2021   Chronic scapular  pain 04/12/2021   Epistaxis 03/29/2021   Coronary artery disease due to calcified coronary lesion 08/15/2020   Basal cell carcinoma 06/30/2020   History of loop recorder 12/13/2019   Allergic rhinitis 12/08/2019   Chronic cough 11/08/2019   Post-nasal drip 11/08/2019   Multiple joint pain 04/22/2019   Bilateral leg edema 04/22/2019   Vaginal atrophy 01/05/2019   Resting tremor 09/08/2018   Sarcoidosis 05/08/2018   HTN (hypertension) 05/08/2018   Colon polyp 05/08/2018   Family hx of colon cancer 05/08/2018   Cystocele, midline 05/08/2018   Hyperlipidemia 05/08/2018   Vasovagal syncope 05/08/2018   Seborrheic  keratosis 05/08/2018   Pseudophakia of both eyes 05/08/2018   GERD (gastroesophageal reflux disease) 05/08/2018   COPD (chronic obstructive pulmonary disease) (HCC) 05/08/2018   Osteoporosis 12/31/2016     REFERRING PROVIDER:  Clearnce Curia, NP    Syliva Even, MD      REFERRING DIAG:  [phone] (ICD-10-CM) - Chronic bilateral low back pain without sciatica  M54.6,G89.29 (ICD-10-CM) - Chronic left-sided thoracic back pain   R06.09 (ICD-10-CM) - Exertional dyspnea              R53.81 (ICD-10-CM) - Physical deconditioning  THERAPY DIAG:  Muscle weakness (generalized)  Pain in thoracic spine  Other low back pain  Difficulty in walking, not elsewhere classified  Rationale for Evaluation and Treatment: Rehabilitation  ONSET DATE: PT order 04/24/2023  SUBJECTIVE:   SUBJECTIVE STATEMENT:   Pt denies any adverse effects after prior Rx.  Pt had a massage the following day and states she was exhausted afterwards.  "Not a huge change, but a change."  Pt reports 50% improvement in tolerance with daily activities and mobility.  Pt states she can do a little more in her home.  Pt was able to garden for 30 mins 2 days ago and 35 mins yesterday.  She was only able to perform 15 mins of gardening prior to therapy.  Pt has improved confidence with ambulation with SPC.   Pt reports that the improvement in fatigue and SOB depends on the day.   Pt has noticed a slight change in her pain; it's a little better.    PERTINENT HISTORY: Chronic thoracic pain and lumbar pain CAD s/p CABG x 4 on 01/04/2022 Osteoporosis syncope, pulmonary sarcoidosis, implanted loop recorder Arthritis and HTN Memory problems since CABG surgery--she is being followed by neurology.    PAIN:  4/10 L scapular pain current, 10/10 worst, 1/10 best Pt doesn't know what increases her pain.              0/10 R scapular pain, 5-6/10 worst 3/10 lumbar currently ; 9-10/10 worst  Easing factors:  heating pad,  tylenol Aggravating factors:  Pt not sure what increases her sx's.   PRECAUTIONS: Other: Implanted loop recorder, osteoporosis, balance, CABG, syncope    WEIGHT BEARING RESTRICTIONS: No  FALLS:  Has patient fallen in last 6 months? Yes. Number of falls 1 on 02/23/2023, syncope  LIVING ENVIRONMENT: Lives with: lives with their spouse Lives in:  1 story home Stairs: 2-4 steps to enter/exit home with rail Has following equipment at home: Single point cane, Walker - 4 wheeled, and walking sticks  OCCUPATION: Pt is retired  PLOF: Independent  PATIENT GOALS: to walk a straight line, to have improved activity tolerance after a short rest break, be more flexible     OBJECTIVE:  Note: Objective measures were completed at Evaluation unless otherwise noted.  DIAGNOSTIC FINDINGS:  Lumbar  X rays on 3/4: FINDINGS: Five lumbar type vertebral bodies are well visualized. Vertebral body height is well maintained. Disc space narrowing is noted at L5-S1. No anterolisthesis is seen. No soft tissue abnormality is noted. Right renal artery stent is seen.   IMPRESSION: Degenerative change without acute abnormality.   CT chest on 3/5: IMPRESSION: *Stable extensive fibro atelectatic fibrocalcific changes of both lungs with significant bilateral bronchiectasis and scar changes involving particularly the upper and mid lung distributions with significant central pulmonary retraction and traction bronchiectasis and architectural distortion. These findings correlate with chronic interstitial lung disease and correlate with sarcoidosis stage IV. *No new significant findings. No acute infiltrates or consolidations. *Stable multiple calcified mediastinal and hilar lymph nodes.Per MD note, Lumbar X-rays showed DDD worse L5-S1.  No acute fractures are visible.   TREATMENT DATE:  4/17  PATIENT SURVEYS:  LEFS:  Initial:  42/80    Current:  50/80 POSTURE:  Significant kyphosis and  FHP   VITALS:   At beginning of Rx:      O2:  99%         HR:  69   Reviewed current function, response to prior Rx, and pain levels.  FUNCTIONAL TESTS:  3 MWT: Initial:  529 ft.   Pt felt SOB at 1 min 48 sec.  O2:  99%, HR:  104      Current:  559 ft  O2:  96%, HR:  104.  HR decreased to 83 and O2 improved to 99%  Ther-Ex: Nustep just LE's at x 6 mins with a rest break at 3 mins            L4 x 3 mins:  HR:  93-94 which quickly decreased, O2: 99%            L4 x 3 mins:  HR:  87 which quickly decreased, O2:  99% though did decrease to 94%.   O2 increased to 99% as she rested  Shoulder pulleys 2x10     Neuro Re-ed: Rows with retraction 2x10 reps with YTB Standing shoulder extension with retraction with YTB 2x10                                                                                              PATIENT EDUCATION:  Education details:  posture, exercise form, rationale for interventions, vitals, relevant anatomy, HEP, what to expect next Rx, and POC.  Person educated: Patient Education method: Explanation, Demonstration, Tactile cues, Verbal cues Education comprehension: verbalized understanding, returned demonstration, verbal cues required, tactile cues required, and needs further education      HOME EXERCISE PROGRAM: Access Code: [email or web address] URL: [email or web address] Date: 05/29/2023 Prepared by: Mayme Spearman  Exercises - Standing Tandem Balance with Counter Support  - 2-3 x daily - 1-2 sets - 1-2 reps - 30sec hold - Side Stepping with Counter Support  - 2-3 x daily - 1 sets - 8 reps - Backward Walking with Counter Support  - 2-3 x daily - 1 sets - 8 reps  Updated HEP: - Seated Scapular Retraction  - 2 x daily - 7 x weekly - 1-2  sets - 10 reps - Standing Shoulder Row with Anchored Resistance  - 1 x daily - 3-4 x weekly - 2 sets - 10 reps  ASSESSMENT:  CLINICAL IMPRESSION: Pt is improving with mobility, pain, tolerance to activity, and  function as evidenced by subjective reports.  She reports 50% improvement in tolerance with daily activities and mobility.  Pt states she can do a little more in her home.  She has increased her duration of gardening.  Pt reports improved confidence with ambulation with SPC.  Though she has made improvement, she continues to have decreased tolerance to activity, fatigue, and SOB.  Pt had a minimal improvement in 3 MWT with ambulation distance increasing by 30 feet.  Pt did have SOB with 3 MWT.  Pt has an appropriate response in vitals with exercises/activity.  Pt demonstrated improved self perceived disability with LEFS improving from 42/80 prior to 50/80 currently.  Pt has recently begun PT here for thoracic and lumbar pain also.  New goals have been set for her pain.  Pt has met STG's #2,3 and LTG #4 and partially met LTG #1.  Pt should benefit from continued skilled PT services to improve posture, pain, strength, ROM, mobility, tolerance to activity, and function.         OBJECTIVE IMPAIRMENTS: decreased activity tolerance, decreased balance, decreased endurance, decreased mobility, difficulty walking, decreased strength, and pain.   ACTIVITY LIMITATIONS: standing, squatting, transfers, and locomotion level  PARTICIPATION LIMITATIONS: cleaning, shopping, and community activity  PERSONAL FACTORS: 3+ comorbidities: osteoporosis, CABG, arthritis, syncope, pulmonary sarcoidosis  are also affecting patient's functional outcome.   REHAB POTENTIAL: Good  CLINICAL DECISION MAKING: Stable/uncomplicated  EVALUATION COMPLEXITY: Low   GOALS:  SHORT TERM GOALS: Target date: 06/11/2023  Pt will be independent and compliant with HEP for improved strength, tolerance to activity, functional endurance, pain, and mobility.   Baseline: 06/12/23: HEP ongoing Goal status: ONGOING  2.  Pt will report at least a 25% improvement in tolerance to activity and her functional mobility.  Baseline:  06/12/23:  reports 35-40% improvement, particularly with housework Goal status: MET  3.  Pt will perform 5x STS test in no > than 19 sec for improved functional LE strength and performance of transfers.   Baseline:  06/12/23: 13.95sec no UE support Goal status: MET  4.  Pt will ambulate entire 6 MWT test.   Baseline:  Goal status: ONGOING /  not assessed  5.  Pt will report at least a 25% improvement in thoracic and lumbar pain and sx's overall.  Goal status:  INITIAL  Target date:  07/16/2023    LONG TERM GOALS: Target date: 08/07/2023  Pt will report at least a 70% improvement in her tolerance with daily activities and functional endurance with mobility.    Baseline:    Goal status: 70% MET  2.  Pt will increase ambulation distance by at least 150 ft on 3 MWT.  Baseline:  Goal status: 20% MET  3.  Pt will report increased ambulation distance with reduced fatigue and SOB. Baseline:  Goal status: PROGRESSING  4.  Pt will perform 5x STS test in < than 16 sec for improved functional LE strength and performance of transfers.  Baseline:  Goal status: GOAL MET  3/27  5.  Pt will demo improved bilat LE strength to 4+/5 in hip flexion and 5/5 in knee extension and at least 6-8# increase in abd for improved performance of functional mobility and performance of household chores. Baseline:  Goal status: INITIAL  6.  Pt will report at least a 70% improvement in her thoracic and lumbar pain overall.    Goal status:  INITIAL  Target date:  08/06/2023  7.  Pt will demo improved L shoulder flexion/abd AROM by 15/10 deg for improved UE elevation and reaching.   Goal status:  INITIAL  Target date:  08/06/2023  8.  Pt will report she is able to use her L UE more with daily activities by at least 50% without significant pain.   Goal status:  INITIAL  Target date:  08/06/2023     PLAN:  PT FREQUENCY: 2x/week  PT DURATION: 5 Weeks  PLANNED INTERVENTIONS: 97164- PT Re-evaluation,  97110-Therapeutic exercises, 97530- Therapeutic activity, [id number]- Neuromuscular re-education, 97535- Self Care, 16109- Manual therapy, [id number]- Gait training, [id number]- Aquatic Therapy, [phone]- Ultrasound, Patient/Family education, Balance training, Stair training, Taping, Dry Needling, Cryotherapy, and Moist heat  PLAN FOR NEXT SESSION:  review/update HEP PRN.  Cont with gait, ambulation, balance, functional endurance, and  LE strengthening.  Monitoring activity tolerance via borg dyspnea scale and/or vitals. NO e-stim STM to medial scap mm and thoracic and lumbar paraspinals.  Work on scap retraction form.  Rows with theraband.  Shoulder pulleys   Trina Fujita III PT, DPT 07/03/23 4:05 PM

## 2023-07-08 ENCOUNTER — Ambulatory Visit (HOSPITAL_BASED_OUTPATIENT_CLINIC_OR_DEPARTMENT_OTHER): Admitting: Physical Therapy

## 2023-07-08 ENCOUNTER — Encounter (HOSPITAL_BASED_OUTPATIENT_CLINIC_OR_DEPARTMENT_OTHER): Payer: Self-pay | Admitting: Physical Therapy

## 2023-07-08 DIAGNOSIS — M546 Pain in thoracic spine: Secondary | ICD-10-CM

## 2023-07-08 DIAGNOSIS — R262 Difficulty in walking, not elsewhere classified: Secondary | ICD-10-CM

## 2023-07-08 DIAGNOSIS — M5459 Other low back pain: Secondary | ICD-10-CM

## 2023-07-08 DIAGNOSIS — R293 Abnormal posture: Secondary | ICD-10-CM

## 2023-07-08 DIAGNOSIS — M6281 Muscle weakness (generalized): Secondary | ICD-10-CM | POA: Diagnosis not present

## 2023-07-08 NOTE — Therapy (Signed)
 OUTPATIENT PHYSICAL THERAPY TREATMENT         Patient Name: Kristen Suarez MRN: [id number] DOB:08/09/1945, 78 y.o., female Today's Date: 07/09/2023  END OF SESSION:  PT End of Session - 07/08/23 1422     Visit Number 11    Number of Visits 20    Date for PT Re-Evaluation 08/07/23    Authorization Type MCR A and B    PT Start Time 1407    PT Stop Time 1449    PT Time Calculation (min) 42 min    Activity Tolerance Patient tolerated treatment well    Behavior During Therapy [facility] for tasks assessed/performed                     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/1976   Weight loss, unintentional 12/31/2022   Past Surgical History:  Procedure Laterality Date   ABDOMINAL HYSTERECTOMY  1987   BASAL CELL CARCINOMA EXCISION  06/30/2020   nose   CARDIAC CATHETERIZATION     CLIPPING OF ATRIAL APPENDAGE N/A 01/04/2022   Procedure: CLIPPING OF ATRIAL APPENDAGE USING SIZE 40 ATRICURE ATRIAL CLIP;  Surgeon: Eleanora Grew, MD;  Location: [facility] OR;  Service: Open Heart Surgery;  Laterality: N/A;   CORONARY ARTERY BYPASS GRAFT N/A 01/04/2022   Procedure: CORONARY ARTERY BYPASS GRAFTING (CABG) x  USING ENDOSCOPIC GREATER SAPHENOUS VEIN HARVEST;  Surgeon: Eleanora Grew, MD;  Location: [facility] OR;  Service: Open Heart Surgery;  Laterality: N/A;   LEFT HEART CATH AND CORONARY ANGIOGRAPHY N/A 01/02/2022   Procedure: LEFT HEART CATH AND CORONARY ANGIOGRAPHY;  Surgeon: Millicent Ally, MD;  Location: [facility] INVASIVE CV LAB;  Service: Cardiovascular;  Laterality: N/A;   LUNG BIOPSY   06/16/1976   PERIPHERAL VASCULAR INTERVENTION Right 11/29/2020   Procedure: PERIPHERAL VASCULAR INTERVENTION;  Surgeon: Wenona Hamilton, MD;  Location: [facility] INVASIVE CV LAB;  Service: Cardiovascular;  Laterality: Right;  Renal Artery   RENAL ANGIOGRAPHY N/A 11/29/2020   Procedure: RENAL ANGIOGRAPHY;  Surgeon: Wenona Hamilton, MD;  Location: [facility] INVASIVE CV LAB;  Service: Cardiovascular;  Laterality: N/A;   TEE WITHOUT CARDIOVERSION N/A 01/04/2022   Procedure: TRANSESOPHAGEAL ECHOCARDIOGRAM (TEE);  Surgeon: Eleanora Grew, MD;  Location[facility];  Service: Open Heart Surgery;  Laterality: N/A;   Patient Active Problem List   Diagnosis Date Noted   PAF (paroxysmal atrial fibrillation) (HCC) 07/02/2023   Memory impairment 03/21/2023   Adjustment insomnia 08/19/2022   Nocturia more than twice per night 08/19/2022   Bilateral sensorineural hearing loss 06/27/2022   Hearing loss of right ear 02/04/2022   Hx of CABG 01/04/2022   Elevated coronary artery calcium  score    Chronic kidney disease, stage 3b (HCC) 01/01/2022   Syncope 12/31/2021   Renal artery stenosis (HCC) 09/25/2021   Aortic valve sclerosis 09/25/2021   Snoring 04/13/2021   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   Epistaxis 03/29/2021   Coronary artery disease due to calcified coronary lesion 08/15/2020  Basal cell carcinoma 06/30/2020   History of loop recorder 12/13/2019   Allergic rhinitis 12/08/2019   Chronic cough 11/08/2019   Post-nasal drip 11/08/2019   Multiple joint pain 04/22/2019   Bilateral leg edema 04/22/2019   Vaginal atrophy 01/05/2019   Resting tremor 09/08/2018   Sarcoidosis 05/08/2018   HTN (hypertension) 05/08/2018   Colon polyp 05/08/2018   Family hx of colon cancer 05/08/2018   Cystocele, midline 05/08/2018   Hyperlipidemia 05/08/2018   Vasovagal syncope 05/08/2018   Seborrheic keratosis 05/08/2018   Pseudophakia of both eyes 05/08/2018   GERD (gastroesophageal reflux disease) 05/08/2018   COPD  (chronic obstructive pulmonary disease) (HCC) 05/08/2018   Osteoporosis 12/31/2016     REFERRING PROVIDER:  Clearnce Curia, NP    Syliva Even, MD      REFERRING DIAG:  [phone] (ICD-10-CM) - Chronic bilateral low back pain without sciatica  M54.6,G89.29 (ICD-10-CM) - Chronic left-sided thoracic back pain   R06.09 (ICD-10-CM) - Exertional dyspnea              R53.81 (ICD-10-CM) - Physical deconditioning  THERAPY DIAG:  Muscle weakness (generalized)  Pain in thoracic spine  Other low back pain  Difficulty in walking, not elsewhere classified  Abnormal posture  Rationale for Evaluation and Treatment: Rehabilitation  ONSET DATE: PT order 04/24/2023  SUBJECTIVE:   SUBJECTIVE STATEMENT:   Pt states she had a few aches and pains after prior Rx though nothing significant.  Pt felt a pull in her R thigh like a little strained, though no pain after prior Rx.  Pt takes Tylenol  daily.  Pt states she can do a little more in her home.  Pt did some gardening for 30 mins on Saturday and felt fine.  Pt has noticed a slight change in her pain; it's a little better.  The energy hasn't changed much.   PERTINENT HISTORY: Chronic thoracic pain and lumbar pain CAD s/p CABG x 4 on 01/04/2022 Osteoporosis syncope, pulmonary sarcoidosis, implanted loop recorder Arthritis and HTN Memory problems since CABG surgery--she is being followed by neurology.    PAIN:  4-5/10 L scapular pain current, 10/10 worst, 1/10 best Pt doesn't know what increases her pain.              0/10 R scapular pain, 5-6/10 worst 0/10 lumbar currently ; 9-10/10 worst.  Pt states her lumbar feels strained, though no pain.   Easing factors:  heating pad, tylenol  Aggravating factors:  Pt not sure what increases her sx's.   PRECAUTIONS: Other: Implanted loop recorder, osteoporosis, balance, CABG, syncope    WEIGHT BEARING RESTRICTIONS: No  FALLS:  Has patient fallen in last 6 months? Yes. Number of falls  1 on 02/23/2023, syncope  LIVING ENVIRONMENT: Lives with: lives with their spouse Lives in:  1 story home Stairs: 2-4 steps to enter/exit home with rail Has following equipment at home: Single point cane, Walker - 4 wheeled, and walking sticks  OCCUPATION: Pt is retired  PLOF: Independent  PATIENT GOALS: to walk a straight line, to have improved activity tolerance after a short rest break, be more flexible     OBJECTIVE:  Note: Objective measures were completed at Evaluation unless otherwise noted.  DIAGNOSTIC FINDINGS:  Lumbar X rays on 3/4: FINDINGS: Five lumbar type vertebral bodies are well visualized. Vertebral body height is well maintained. Disc space narrowing is noted at L5-S1. No anterolisthesis is seen. No soft tissue abnormality is noted. Right renal artery stent is seen.   IMPRESSION:  Degenerative change without acute abnormality.   CT chest on 3/5: IMPRESSION: *Stable extensive fibro atelectatic fibrocalcific changes of both lungs with significant bilateral bronchiectasis and scar changes involving particularly the upper and mid lung distributions with significant central pulmonary retraction and traction bronchiectasis and architectural distortion. These findings correlate with chronic interstitial lung disease and correlate with sarcoidosis stage IV. *No new significant findings. No acute infiltrates or consolidations. *Stable multiple calcified mediastinal and hilar lymph nodes.Per MD note, Lumbar X-rays showed DDD worse L5-S1.  No acute fractures are visible.   TREATMENT DATE:  4/22   POSTURE:  Significant kyphosis and FHP   VITALS:   At beginning of Rx:      O2:  97%         HR:  71   Reviewed current function, response to prior Rx, and pain levels.  Ther-Ex: Nustep x 7 mins with a rest break at 4 mins            L4 x 4 mins:  HR:  85, O2: 94%.  O2 improved to 98% with resting            L4 x 3 mins:  HR:  88, O2:  96%   Shoulder  pulleys in flexion 2x10, scaption x 10     Neuro Re-ed: Rows with retraction 2x10 reps with RTB Seated shoulder ER bilat with retraction with YTB 2x10        Manual Therapy:  STM to L medial scap mm and thoracic paraspinals in sitting.                                                                                         PATIENT EDUCATION:  Education details:  PT answered pt's questions.  posture, exercise form, rationale for interventions, vitals, relevant anatomy, HEP, what to expect next Rx, and POC.  Person educated: Patient Education method: Explanation, Demonstration, Tactile cues, Verbal cues Education comprehension: verbalized understanding, returned demonstration, verbal cues required, tactile cues required, and needs further education      HOME EXERCISE PROGRAM: Access Code: [email or web address] URL: [email or web address] Date: 05/29/2023 Prepared by: Mayme Spearman  Exercises - Standing Tandem Balance with Counter Support  - 2-3 x daily - 1-2 sets - 1-2 reps - 30sec hold - Side Stepping with Counter Support  - 2-3 x daily - 1 sets - 8 reps - Backward Walking with Counter Support  - 2-3 x daily - 1 sets - 8 reps   - Seated Scapular Retraction  - 2 x daily - 7 x weekly - 1-2 sets - 10 reps - Standing Shoulder Row with Anchored Resistance  - 1 x daily - 3-4 x weekly - 2 sets - 10 reps  ASSESSMENT:  CLINICAL IMPRESSION: Pt has increased her duration of gardening.  She continues to have decreased tolerance to activity, fatigue, and SOB.  Pt is increasing with Nustep and tolerating it well. Pt likes the pulleys and states she ordered a set of pulleys.  Pt performed exercises well with cuing and instruction in correct form.  PT performed STM to improve soft tissue tightness and pain.  She responded well to  Rx and stated she had increased pain after Rx to 6/10, which is typical for her.  Pt should benefit from continued skilled PT services to improve posture, pain, strength,  ROM, mobility, tolerance to activity, and function.           OBJECTIVE IMPAIRMENTS: decreased activity tolerance, decreased balance, decreased endurance, decreased mobility, difficulty walking, decreased strength, and pain.   ACTIVITY LIMITATIONS: standing, squatting, transfers, and locomotion level  PARTICIPATION LIMITATIONS: cleaning, shopping, and community activity  PERSONAL FACTORS: 3+ comorbidities: osteoporosis, CABG, arthritis, syncope, pulmonary sarcoidosis  are also affecting patient's functional outcome.   REHAB POTENTIAL: Good  CLINICAL DECISION MAKING: Stable/uncomplicated  EVALUATION COMPLEXITY: Low   GOALS:  SHORT TERM GOALS: Target date: 06/11/2023  Pt will be independent and compliant with HEP for improved strength, tolerance to activity, functional endurance, pain, and mobility.   Baseline: 06/12/23: HEP ongoing Goal status: ONGOING  2.  Pt will report at least a 25% improvement in tolerance to activity and her functional mobility.  Baseline:  06/12/23: reports 35-40% improvement, particularly with housework Goal status: MET  3.  Pt will perform 5x STS test in no > than 19 sec for improved functional LE strength and performance of transfers.   Baseline:  06/12/23: 13.95sec no UE support Goal status: MET  4.  Pt will ambulate entire 6 MWT test.   Baseline:  Goal status: ONGOING /  not assessed  5.  Pt will report at least a 25% improvement in thoracic and lumbar pain and sx's overall.  Goal status:  INITIAL  Target date:  07/16/2023    LONG TERM GOALS: Target date: 08/07/2023  Pt will report at least a 70% improvement in her tolerance with daily activities and functional endurance with mobility.    Baseline:    Goal status: 70% MET  2.  Pt will increase ambulation distance by at least 150 ft on 3 MWT.  Baseline:  Goal status: 20% MET  3.  Pt will report increased ambulation distance with reduced fatigue and SOB. Baseline:  Goal status:  PROGRESSING  4.  Pt will perform 5x STS test in < than 16 sec for improved functional LE strength and performance of transfers.  Baseline:  Goal status: GOAL MET  3/27  5.  Pt will demo improved bilat LE strength to 4+/5 in hip flexion and 5/5 in knee extension and at least 6-8# increase in abd for improved performance of functional mobility and performance of household chores. Baseline:  Goal status: INITIAL  6.  Pt will report at least a 70% improvement in her thoracic and lumbar pain overall.    Goal status:  INITIAL  Target date:  08/06/2023  7.  Pt will demo improved L shoulder flexion/abd AROM by 15/10 deg for improved UE elevation and reaching.   Goal status:  INITIAL  Target date:  08/06/2023  8.  Pt will report she is able to use her L UE more with daily activities by at least 50% without significant pain.   Goal status:  INITIAL  Target date:  08/06/2023     PLAN:  PT FREQUENCY: 2x/week  PT DURATION: 5 Weeks  PLANNED INTERVENTIONS: [id number]- PT Re-evaluation, 97110-Therapeutic exercises, 97530- Therapeutic activity, [id number]- Neuromuscular re-education, 97535- Self Care, [id number]- Manual therapy, [id number]- Gait training, [phone]- Aquatic Therapy, [phone]- Ultrasound, Patient/Family education, Balance training, Stair training, Taping, Dry Needling, Cryotherapy, and Moist heat  PLAN FOR NEXT SESSION:  review/update HEP PRN.  Cont with gait, ambulation, balance, functional endurance, and  LE strengthening.  Monitoring activity tolerance via borg dyspnea scale and/or vitals. NO e-stim STM to medial scap mm and thoracic and lumbar paraspinals.  Rows with theraband.  Shoulder pulleys   Trina Fujita III PT, DPT 07/09/23 9:53 PM

## 2023-07-09 ENCOUNTER — Ambulatory Visit: Payer: Medicare Other | Admitting: Emergency Medicine

## 2023-07-15 ENCOUNTER — Encounter (HOSPITAL_BASED_OUTPATIENT_CLINIC_OR_DEPARTMENT_OTHER): Payer: Self-pay | Admitting: Physical Therapy

## 2023-07-15 ENCOUNTER — Ambulatory Visit (HOSPITAL_BASED_OUTPATIENT_CLINIC_OR_DEPARTMENT_OTHER): Payer: Self-pay | Admitting: Physical Therapy

## 2023-07-15 DIAGNOSIS — R293 Abnormal posture: Secondary | ICD-10-CM

## 2023-07-15 DIAGNOSIS — R262 Difficulty in walking, not elsewhere classified: Secondary | ICD-10-CM

## 2023-07-15 DIAGNOSIS — M5459 Other low back pain: Secondary | ICD-10-CM

## 2023-07-15 DIAGNOSIS — M6281 Muscle weakness (generalized): Secondary | ICD-10-CM

## 2023-07-15 DIAGNOSIS — M546 Pain in thoracic spine: Secondary | ICD-10-CM

## 2023-07-15 NOTE — Therapy (Signed)
 OUTPATIENT PHYSICAL THERAPY TREATMENT         Patient Name: Kristen Suarez MRN: [id number] DOB:03-06-1946, 78 y.o., female Today's Date: 07/16/2023  END OF SESSION:  PT End of Session - 07/15/23 1501     Visit Number 12    Number of Visits 20    Date for PT Re-Evaluation 08/07/23    Authorization Type MCR A and B    PT Start Time 1456    PT Stop Time 1534    PT Time Calculation (min) 38 min    Activity Tolerance Patient tolerated treatment well    Behavior During Therapy [facility] for tasks assessed/performed                     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/1976   Weight loss, unintentional 12/31/2022   Past Surgical History:  Procedure Laterality Date   ABDOMINAL HYSTERECTOMY  1987   BASAL CELL CARCINOMA EXCISION  06/30/2020   nose   CARDIAC CATHETERIZATION     CLIPPING OF ATRIAL APPENDAGE N/A 01/04/2022   Procedure: CLIPPING OF ATRIAL APPENDAGE USING SIZE 40 ATRICURE ATRIAL CLIP;  Surgeon: Eleanora Grew, MD;  Location: [facility] OR;  Service: Open Heart Surgery;  Laterality: N/A;   CORONARY ARTERY BYPASS GRAFT N/A 01/04/2022   Procedure: CORONARY ARTERY BYPASS GRAFTING (CABG) x  USING ENDOSCOPIC GREATER SAPHENOUS VEIN HARVEST;  Surgeon: Eleanora Grew, MD;  Location: [facility] OR;  Service: Open Heart Surgery;  Laterality: N/A;   LEFT HEART CATH AND CORONARY ANGIOGRAPHY N/A 01/02/2022   Procedure: LEFT HEART CATH AND CORONARY ANGIOGRAPHY;  Surgeon: Millicent Ally, MD;  Location: [facility] INVASIVE CV LAB;  Service: Cardiovascular;  Laterality: N/A;   LUNG BIOPSY   06/16/1976   PERIPHERAL VASCULAR INTERVENTION Right 11/29/2020   Procedure: PERIPHERAL VASCULAR INTERVENTION;  Surgeon: Wenona Hamilton, MD;  Location: [facility] INVASIVE CV LAB;  Service: Cardiovascular;  Laterality: Right;  Renal Artery   RENAL ANGIOGRAPHY N/A 11/29/2020   Procedure: RENAL ANGIOGRAPHY;  Surgeon: Wenona Hamilton, MD;  Location: [facility] INVASIVE CV LAB;  Service: Cardiovascular;  Laterality: N/A;   TEE WITHOUT CARDIOVERSION N/A 01/04/2022   Procedure: TRANSESOPHAGEAL ECHOCARDIOGRAM (TEE);  Surgeon: Eleanora Grew, MD;  Location: [facility] OR;  Service: Open Heart Surgery;  Laterality: N/A;   Patient Active Problem List   Diagnosis Date Noted   PAF (paroxysmal atrial fibrillation) (HCC) 07/02/2023   Memory impairment 03/21/2023   Adjustment insomnia 08/19/2022   Nocturia more than twice per night 08/19/2022   Bilateral sensorineural hearing loss 06/27/2022   Hearing loss of right ear 02/04/2022   Hx of CABG 01/04/2022   Elevated coronary artery calcium  score    Chronic kidney disease, stage 3b (HCC) 01/01/2022   Syncope 12/31/2021   Renal artery stenosis (HCC) 09/25/2021   Aortic valve sclerosis 09/25/2021   Snoring 04/13/2021   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   Epistaxis 03/29/2021   Coronary artery disease due to calcified coronary lesion 08/15/2020  Basal cell carcinoma 06/30/2020   History of loop recorder 12/13/2019   Allergic rhinitis 12/08/2019   Chronic cough 11/08/2019   Post-nasal drip 11/08/2019   Multiple joint pain 04/22/2019   Bilateral leg edema 04/22/2019   Vaginal atrophy 01/05/2019   Resting tremor 09/08/2018   Sarcoidosis 05/08/2018   HTN (hypertension) 05/08/2018   Colon polyp 05/08/2018   Family hx of colon cancer 05/08/2018   Cystocele, midline 05/08/2018   Hyperlipidemia 05/08/2018   Vasovagal syncope 05/08/2018   Seborrheic keratosis 05/08/2018   Pseudophakia of both eyes 05/08/2018   GERD (gastroesophageal reflux disease) 05/08/2018   COPD  (chronic obstructive pulmonary disease) (HCC) 05/08/2018   Osteoporosis 12/31/2016     REFERRING PROVIDER:  Clearnce Curia, NP    Syliva Even, MD      REFERRING DIAG:  [id number] (ICD-10-CM) - Chronic bilateral low back pain without sciatica  M54.6,G89.29 (ICD-10-CM) - Chronic left-sided thoracic back pain   R06.09 (ICD-10-CM) - Exertional dyspnea              R53.81 (ICD-10-CM) - Physical deconditioning  THERAPY DIAG:  Muscle weakness (generalized)  Pain in thoracic spine  Other low back pain  Difficulty in walking, not elsewhere classified  Abnormal posture  Rationale for Evaluation and Treatment: Rehabilitation  ONSET DATE: PT order 04/24/2023  SUBJECTIVE:   SUBJECTIVE STATEMENT:   Pt states she felt fine after prior Rx.  Pt states her shoulder blade is really bothering her today.  Pt had a few aches and pains after prior Rx though nothing significant.  Pt takes Tylenol  daily and took it at 2:00 PM today.  Pt has been performing pulleys at home.  She states household chores really make her tired.  Her energy hasn't changed much.  Pt reports she doesn't feel like she bumps into the walls when walking down her hallways like she did 5 months ago.    PERTINENT HISTORY: Chronic thoracic pain and lumbar pain CAD s/p CABG x 4 on 01/04/2022 Osteoporosis syncope, pulmonary sarcoidosis, implanted loop recorder Arthritis and HTN Memory problems since CABG surgery--she is being followed by neurology.    PAIN:  7/10 L scapular pain current, 10/10 worst, 1/10 best Pt doesn't know what increases her pain.              0/10 R scapular pain, 5-6/10 worst 0/10 lumbar currently ; 9-10/10 worst.  Pt states her lumbar feels strained, though no pain.   Easing factors:  heating pad, tylenol  Aggravating factors:  Pt not sure what increases her sx's.   PRECAUTIONS: Other: Implanted loop recorder, osteoporosis, balance, CABG, syncope    WEIGHT BEARING RESTRICTIONS:  No  FALLS:  Has patient fallen in last 6 months? Yes. Number of falls 1 on 02/23/2023, syncope  LIVING ENVIRONMENT: Lives with: lives with their spouse Lives in:  1 story home Stairs: 2-4 steps to enter/exit home with rail Has following equipment at home: Single point cane, Walker - 4 wheeled, and walking sticks  OCCUPATION: Pt is retired  PLOF: Independent  PATIENT GOALS: to walk a straight line, to have improved activity tolerance after a short rest break, be more flexible     OBJECTIVE:  Note: Objective measures were completed at Evaluation unless otherwise noted.  DIAGNOSTIC FINDINGS:  Lumbar X rays on 3/4: FINDINGS: Five lumbar type vertebral bodies are well visualized. Vertebral body height is well maintained. Disc space narrowing is noted at L5-S1. No anterolisthesis is seen. No soft tissue abnormality is noted. Right  renal artery stent is seen.   IMPRESSION: Degenerative change without acute abnormality.   CT chest on 3/5: IMPRESSION: *Stable extensive fibro atelectatic fibrocalcific changes of both lungs with significant bilateral bronchiectasis and scar changes involving particularly the upper and mid lung distributions with significant central pulmonary retraction and traction bronchiectasis and architectural distortion. These findings correlate with chronic interstitial lung disease and correlate with sarcoidosis stage IV. *No new significant findings. No acute infiltrates or consolidations. *Stable multiple calcified mediastinal and hilar lymph nodes.Per MD note, Lumbar X-rays showed DDD worse L5-S1.  No acute fractures are visible.   TREATMENT DATE:  4/22   POSTURE:  Significant kyphosis and FHP   VITALS:   At beginning of Rx:      O2:  95-96%         HR:  85   Reviewed current function, response to prior Rx, and pain levels.  Ther-Ex: Nustep x 7 mins with a rest break at 4 mins            L4 x 4 mins:  HR:  91, O2: 96%.  O2 improved to 97%  and HR to 83 with resting            L4 x 3 mins:  HR:  89-90, O2:  97%   Shoulder pulleys in flexion 2x10, scaption x 10  Sit to stands from chair with hands on knees 2x7 reps O2:  97%, HR:  90    Tandem gait with UE support on rail       Manual Therapy:  STM to L medial scap mm, post scap, and thoracic paraspinals in sitting.                                                                                         PATIENT EDUCATION:  Education details:  PT answered pt's questions.  posture, exercise form, rationale for interventions, vitals, relevant anatomy, HEP, what to expect next Rx, and POC.  Person educated: Patient Education method: Explanation, Demonstration, Tactile cues, Verbal cues Education comprehension: verbalized understanding, returned demonstration, verbal cues required, tactile cues required, and needs further education      HOME EXERCISE PROGRAM: Access Code: [email or web address] URL: [email or web address] Date: 05/29/2023 Prepared by: Mayme Spearman  Exercises - Standing Tandem Balance with Counter Support  - 2-3 x daily - 1-2 sets - 1-2 reps - 30sec hold - Side Stepping with Counter Support  - 2-3 x daily - 1 sets - 8 reps - Backward Walking with Counter Support  - 2-3 x daily - 1 sets - 8 reps   - Seated Scapular Retraction  - 2 x daily - 7 x weekly - 1-2 sets - 10 reps - Standing Shoulder Row with Anchored Resistance  - 1 x daily - 3-4 x weekly - 2 sets - 10 reps  ASSESSMENT:  CLINICAL IMPRESSION: Pt reports her energy level is about the same.  She does report improved balance with walking at home.  Pt gives good effort with all exercises and performed exercises well.  She now has pulleys at home and reports improved shoulder mobility with pulleys.  She requires UE support  with tandem gait.  PT performed STM to improve soft tissue tightness and thoracic/scapular pain.  She responded well to Rx having no increased pain after Rx.  Pt should benefit from  continued skilled PT services to improve posture, pain, strength, ROM, mobility, tolerance to activity, and function.         OBJECTIVE IMPAIRMENTS: decreased activity tolerance, decreased balance, decreased endurance, decreased mobility, difficulty walking, decreased strength, and pain.   ACTIVITY LIMITATIONS: standing, squatting, transfers, and locomotion level  PARTICIPATION LIMITATIONS: cleaning, shopping, and community activity  PERSONAL FACTORS: 3+ comorbidities: osteoporosis, CABG, arthritis, syncope, pulmonary sarcoidosis  are also affecting patient's functional outcome.   REHAB POTENTIAL: Good  CLINICAL DECISION MAKING: Stable/uncomplicated  EVALUATION COMPLEXITY: Low   GOALS:  SHORT TERM GOALS: Target date: 06/11/2023  Pt will be independent and compliant with HEP for improved strength, tolerance to activity, functional endurance, pain, and mobility.   Baseline: 06/12/23: HEP ongoing Goal status: ONGOING  2.  Pt will report at least a 25% improvement in tolerance to activity and her functional mobility.  Baseline:  06/12/23: reports 35-40% improvement, particularly with housework Goal status: MET  3.  Pt will perform 5x STS test in no > than 19 sec for improved functional LE strength and performance of transfers.   Baseline:  06/12/23: 13.95sec no UE support Goal status: MET  4.  Pt will ambulate entire 6 MWT test.   Baseline:  Goal status: ONGOING /  not assessed  5.  Pt will report at least a 25% improvement in thoracic and lumbar pain and sx's overall.  Goal status:  INITIAL  Target date:  07/16/2023    LONG TERM GOALS: Target date: 08/07/2023  Pt will report at least a 70% improvement in her tolerance with daily activities and functional endurance with mobility.    Baseline:    Goal status: 70% MET  2.  Pt will increase ambulation distance by at least 150 ft on 3 MWT.  Baseline:  Goal status: 20% MET  3.  Pt will report increased ambulation distance  with reduced fatigue and SOB. Baseline:  Goal status: PROGRESSING  4.  Pt will perform 5x STS test in < than 16 sec for improved functional LE strength and performance of transfers.  Baseline:  Goal status: GOAL MET  3/27  5.  Pt will demo improved bilat LE strength to 4+/5 in hip flexion and 5/5 in knee extension and at least 6-8# increase in abd for improved performance of functional mobility and performance of household chores. Baseline:  Goal status: INITIAL  6.  Pt will report at least a 70% improvement in her thoracic and lumbar pain overall.    Goal status:  INITIAL  Target date:  08/06/2023  7.  Pt will demo improved L shoulder flexion/abd AROM by 15/10 deg for improved UE elevation and reaching.   Goal status:  INITIAL  Target date:  08/06/2023  8.  Pt will report she is able to use her L UE more with daily activities by at least 50% without significant pain.   Goal status:  INITIAL  Target date:  08/06/2023     PLAN:  PT FREQUENCY: 2x/week  PT DURATION: 5 Weeks  PLANNED INTERVENTIONS: 97164- PT Re-evaluation, 97110-Therapeutic exercises, 97530- Therapeutic activity, [id number]- Neuromuscular re-education, 97535- Self Care, [id number]- Manual therapy, [id number]- Gait training, [phone]- Aquatic Therapy, [phone]- Ultrasound, Patient/Family education, Balance training, Stair training, Taping, Dry Needling, Cryotherapy, and Moist heat  PLAN FOR NEXT SESSION:  review/update HEP PRN.  Cont with gait, ambulation, balance, functional endurance, and  LE strengthening.  Monitoring activity tolerance via borg dyspnea scale and/or vitals. NO e-stim STM to medial scap mm and thoracic and lumbar paraspinals.  Rows with theraband.  Shoulder pulleys   Trina Fujita III PT, DPT 07/16/23 10:02 PM

## 2023-07-17 ENCOUNTER — Ambulatory Visit (HOSPITAL_BASED_OUTPATIENT_CLINIC_OR_DEPARTMENT_OTHER): Attending: Family | Admitting: Physical Therapy

## 2023-07-17 DIAGNOSIS — M5459 Other low back pain: Secondary | ICD-10-CM | POA: Diagnosis present

## 2023-07-17 DIAGNOSIS — R293 Abnormal posture: Secondary | ICD-10-CM | POA: Diagnosis present

## 2023-07-17 DIAGNOSIS — R262 Difficulty in walking, not elsewhere classified: Secondary | ICD-10-CM | POA: Diagnosis present

## 2023-07-17 DIAGNOSIS — M6281 Muscle weakness (generalized): Secondary | ICD-10-CM | POA: Insufficient documentation

## 2023-07-17 DIAGNOSIS — M546 Pain in thoracic spine: Secondary | ICD-10-CM | POA: Insufficient documentation

## 2023-07-17 NOTE — Therapy (Signed)
 OUTPATIENT PHYSICAL THERAPY TREATMENT         Patient Name: Kristen Suarez MRN: [id number] DOB:18-Jul-1945, 78 y.o., female Today's Date: 07/17/2023  END OF SESSION:  PT End of Session - 07/17/23 0949     Visit Number 13    Number of Visits 20    Date for PT Re-Evaluation 08/07/23    Authorization Type MCR A and B    Progress Note Due on Visit 10    PT Start Time 0925    PT Stop Time 1005    PT Time Calculation (min) 40 min    Activity Tolerance Patient tolerated treatment well    Behavior During Therapy [facility] for tasks assessed/performed                      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/1976   Weight loss, unintentional 12/31/2022   Past Surgical History:  Procedure Laterality Date   ABDOMINAL HYSTERECTOMY  1987   BASAL CELL CARCINOMA EXCISION  06/30/2020   nose   CARDIAC CATHETERIZATION     CLIPPING OF ATRIAL APPENDAGE N/A 01/04/2022   Procedure: CLIPPING OF ATRIAL APPENDAGE USING SIZE 40 ATRICURE ATRIAL CLIP;  Surgeon: Eleanora Grew, MD;  Location: [facility] OR;  Service: Open Heart Surgery;  Laterality: N/A;   CORONARY ARTERY BYPASS GRAFT N/A 01/04/2022   Procedure: CORONARY ARTERY BYPASS GRAFTING (CABG) x  USING ENDOSCOPIC GREATER SAPHENOUS VEIN HARVEST;  Surgeon: Eleanora Grew, MD;  Location: [facility] OR;  Service: Open Heart Surgery;  Laterality: N/A;   LEFT HEART CATH AND CORONARY ANGIOGRAPHY N/A 01/02/2022   Procedure: LEFT HEART CATH AND CORONARY ANGIOGRAPHY;  Surgeon: Millicent Ally, MD;  Location: [facility] INVASIVE CV LAB;  Service: Cardiovascular;   Laterality: N/A;   LUNG BIOPSY  06/16/1976   PERIPHERAL VASCULAR INTERVENTION Right 11/29/2020   Procedure: PERIPHERAL VASCULAR INTERVENTION;  Surgeon: Wenona Hamilton, MD;  Location: [facility] INVASIVE CV LAB;  Service: Cardiovascular;  Laterality: Right;  Renal Artery   RENAL ANGIOGRAPHY N/A 11/29/2020   Procedure: RENAL ANGIOGRAPHY;  Surgeon: Wenona Hamilton, MD;  Location: [facility] INVASIVE CV LAB;  Service: Cardiovascular;  Laterality: N/A;   TEE WITHOUT CARDIOVERSION N/A 01/04/2022   Procedure: TRANSESOPHAGEAL ECHOCARDIOGRAM (TEE);  Surgeon: Eleanora Grew, MD;  Location: [facility] OR;  Service: Open Heart Surgery;  Laterality: N/A;   Patient Active Problem List   Diagnosis Date Noted   PAF (paroxysmal atrial fibrillation) (HCC) 07/02/2023   Memory impairment 03/21/2023   Adjustment insomnia 08/19/2022   Nocturia more than twice per night 08/19/2022   Bilateral sensorineural hearing loss 06/27/2022   Hearing loss of right ear 02/04/2022   Hx of CABG 01/04/2022   Elevated coronary artery calcium  score    Chronic kidney disease, stage 3b (HCC) 01/01/2022   Syncope 12/31/2021   Renal artery stenosis (HCC) 09/25/2021   Aortic valve sclerosis 09/25/2021   Snoring 04/13/2021   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   Epistaxis 03/29/2021  Coronary artery disease due to calcified coronary lesion 08/15/2020   Basal cell carcinoma 06/30/2020   History of loop recorder 12/13/2019   Allergic rhinitis 12/08/2019   Chronic cough 11/08/2019   Post-nasal drip 11/08/2019   Multiple joint pain 04/22/2019   Bilateral leg edema 04/22/2019   Vaginal atrophy 01/05/2019   Resting tremor 09/08/2018   Sarcoidosis 05/08/2018   HTN (hypertension) 05/08/2018   Colon polyp 05/08/2018   Family hx of colon cancer 05/08/2018   Cystocele, midline 05/08/2018   Hyperlipidemia 05/08/2018   Vasovagal syncope 05/08/2018   Seborrheic keratosis 05/08/2018   Pseudophakia of both eyes 05/08/2018   GERD (gastroesophageal  reflux disease) 05/08/2018   COPD (chronic obstructive pulmonary disease) (HCC) 05/08/2018   Osteoporosis 12/31/2016     REFERRING PROVIDER:  Clearnce Curia, NP    Syliva Even, MD      REFERRING DIAG:  [phone] (ICD-10-CM) - Chronic bilateral low back pain without sciatica  M54.6,G89.29 (ICD-10-CM) - Chronic left-sided thoracic back pain   R06.09 (ICD-10-CM) - Exertional dyspnea              R53.81 (ICD-10-CM) - Physical deconditioning  THERAPY DIAG:  Muscle weakness (generalized)  Pain in thoracic spine  Other low back pain  Difficulty in walking, not elsewhere classified  Abnormal posture  Rationale for Evaluation and Treatment: Rehabilitation  ONSET DATE: PT order 04/24/2023  SUBJECTIVE:   SUBJECTIVE STATEMENT:  Pt states that she has had a slow morning. She is feeling okay, but is frustrated with the slow progress.    Pt states she felt fine after prior Rx.  Pt states her shoulder blade is really bothering her today.  Pt had a few aches and pains after prior Rx though nothing significant.  Pt takes Tylenol  daily and took it at 2:00 PM today.  Pt has been performing pulleys at home.  She states household chores really make her tired.  Her energy hasn't changed much.  Pt reports she doesn't feel like she bumps into the walls when walking down her hallways like she did 5 months ago.    PERTINENT HISTORY: Chronic thoracic pain and lumbar pain CAD s/p CABG x 4 on 01/04/2022 Osteoporosis syncope, pulmonary sarcoidosis, implanted loop recorder Arthritis and HTN Memory problems since CABG surgery--she is being followed by neurology.    PAIN:  7/10 L scapular pain current, 10/10 worst, 1/10 best Pt doesn't know what increases her pain.              0/10 R scapular pain, 5-6/10 worst 0/10 lumbar currently ; 9-10/10 worst.  Pt states her lumbar feels strained, though no pain.   Easing factors:  heating pad, tylenol  Aggravating factors:  Pt not sure what  increases her sx's.   PRECAUTIONS: Other: Implanted loop recorder, osteoporosis, balance, CABG, syncope    WEIGHT BEARING RESTRICTIONS: No  FALLS:  Has patient fallen in last 6 months? Yes. Number of falls 1 on 02/23/2023, syncope  LIVING ENVIRONMENT: Lives with: lives with their spouse Lives in:  1 story home Stairs: 2-4 steps to enter/exit home with rail Has following equipment at home: Single point cane, Walker - 4 wheeled, and walking sticks  OCCUPATION: Pt is retired  PLOF: Independent  PATIENT GOALS: to walk a straight line, to have improved activity tolerance after a short rest break, be more flexible    OBJECTIVE:  Note: Objective measures were completed at Evaluation unless otherwise noted.  DIAGNOSTIC FINDINGS:  Lumbar X rays on 3/4: FINDINGS:  Five lumbar type vertebral bodies are well visualized. Vertebral body height is well maintained. Disc space narrowing is noted at L5-S1. No anterolisthesis is seen. No soft tissue abnormality is noted. Right renal artery stent is seen.   IMPRESSION: Degenerative change without acute abnormality.   CT chest on 3/5: IMPRESSION: *Stable extensive fibro atelectatic fibrocalcific changes of both lungs with significant bilateral bronchiectasis and scar changes involving particularly the upper and mid lung distributions with significant central pulmonary retraction and traction bronchiectasis and architectural distortion. These findings correlate with chronic interstitial lung disease and correlate with sarcoidosis stage IV. *No new significant findings. No acute infiltrates or consolidations. *Stable multiple calcified mediastinal and hilar lymph nodes.Per MD note, Lumbar X-rays showed DDD worse L5-S1.  No acute fractures are visible.   TREATMENT DATE:  05/01 POSTURE:  Significant kyphosis and FHP   VITALS:   At beginning of Rx:      O2:  98%         HR:  82  Reviewed current function, response to prior Rx, and  pain levels.  Ther-Ex: Nustep x 10 mins with a rest break at 5 min 5 min: HR: 92% and O2: 98%, O2 improved to 97% and HR to 88 with 1.5 min rest 10 min: HR: 92% and O2 98%, O2 improved to 99% and HR to 85 with 1.5 min   Shoulder pulleys in flexion 2x10, scaption x 10  Sit to stands from chair with hands on knees 2x10 reps O2:  97%, HR:  102   Step ups in forward and lateral direction onto airex pad at railing x10 each direction.   4/22   POSTURE:  Significant kyphosis and FHP   VITALS:   At beginning of Rx:      O2:  95-96%         HR:  85    Reviewed current function, response to prior Rx, and pain levels.  Ther-Ex: Nustep x 7 mins with a rest break at 4 mins            L4 x 4 mins:  HR:  91, O2: 96%.  O2 improved to 97% and HR to 83 with resting            L4 x 3 mins:  HR:  89-90, O2:  97%   Shoulder pulleys in flexion 2x10, scaption x 10  Sit to stands from chair with hands on knees 2x7 reps O2:  97%, HR:  90    Tandem gait with UE support on rail       Manual Therapy:  STM to L medial scap mm, post scap, and thoracic paraspinals in sitting.                                                                                      PATIENT EDUCATION:  Education details:  PT answered pt's questions.  posture, exercise form, rationale for interventions, vitals, relevant anatomy, HEP, what to expect next Rx, and POC.  Person educated: Patient Education method: Explanation, Demonstration, Tactile cues, Verbal cues Education comprehension: verbalized understanding, returned demonstration, verbal cues required, tactile cues required, and needs further education  HOME EXERCISE PROGRAM: Access Code: [email or web address] URL: [email or web address] Date: 05/29/2023 Prepared by: Mayme Spearman  Exercises - Standing Tandem Balance with Counter Support  - 2-3 x daily - 1-2 sets - 1-2 reps - 30sec hold - Side Stepping with Counter Support  - 2-3 x daily - 1 sets - 8  reps - Backward Walking with Counter Support  - 2-3 x daily - 1 sets - 8 reps   - Seated Scapular Retraction  - 2 x daily - 7 x weekly - 1-2 sets - 10 reps - Standing Shoulder Row with Anchored Resistance  - 1 x daily - 3-4 x weekly - 2 sets - 10 reps  ASSESSMENT:  CLINICAL IMPRESSION: Pt reports her energy level is about the same. Although she was able to progress more each session with good recovery with HR after a minute rest.   She does report improved balance with walking at home. She plans to garden this weekend. Pt gives good effort with all exercises and performed exercises well. She requires HHA with step up's onto airex today with good tolerance. She responded well to Rx having no increased pain after Rx.  Pt should benefit from continued skilled PT services to improve posture, pain, strength, ROM, mobility, tolerance to activity, and function.         OBJECTIVE IMPAIRMENTS: decreased activity tolerance, decreased balance, decreased endurance, decreased mobility, difficulty walking, decreased strength, and pain.   ACTIVITY LIMITATIONS: standing, squatting, transfers, and locomotion level  PARTICIPATION LIMITATIONS: cleaning, shopping, and community activity  PERSONAL FACTORS: 3+ comorbidities: osteoporosis, CABG, arthritis, syncope, pulmonary sarcoidosis  are also affecting patient's functional outcome.   REHAB POTENTIAL: Good  CLINICAL DECISION MAKING: Stable/uncomplicated  EVALUATION COMPLEXITY: Low   GOALS:  SHORT TERM GOALS: Target date: 06/11/2023  Pt will be independent and compliant with HEP for improved strength, tolerance to activity, functional endurance, pain, and mobility.   Baseline: 06/12/23: HEP ongoing Goal status: ONGOING  2.  Pt will report at least a 25% improvement in tolerance to activity and her functional mobility.  Baseline:  06/12/23: reports 35-40% improvement, particularly with housework Goal status: MET  3.  Pt will perform 5x STS test in  no > than 19 sec for improved functional LE strength and performance of transfers.   Baseline:  06/12/23: 13.95sec no UE support Goal status: MET  4.  Pt will ambulate entire 6 MWT test.   Baseline:  Goal status: ONGOING /  not assessed  5.  Pt will report at least a 25% improvement in thoracic and lumbar pain and sx's overall.  Goal status:  INITIAL  Target date:  07/16/2023    LONG TERM GOALS: Target date: 08/07/2023  Pt will report at least a 70% improvement in her tolerance with daily activities and functional endurance with mobility.    Baseline:    Goal status: 70% MET  2.  Pt will increase ambulation distance by at least 150 ft on 3 MWT.  Baseline:  Goal status: 20% MET  3.  Pt will report increased ambulation distance with reduced fatigue and SOB. Baseline:  Goal status: PROGRESSING  4.  Pt will perform 5x STS test in < than 16 sec for improved functional LE strength and performance of transfers.  Baseline:  Goal status: GOAL MET  3/27  5.  Pt will demo improved bilat LE strength to 4+/5 in hip flexion and 5/5 in knee extension and at least 6-8# increase in abd for improved performance of functional mobility and  performance of household chores. Baseline:  Goal status: INITIAL  6.  Pt will report at least a 70% improvement in her thoracic and lumbar pain overall.    Goal status:  INITIAL  Target date:  08/06/2023  7.  Pt will demo improved L shoulder flexion/abd AROM by 15/10 deg for improved UE elevation and reaching.   Goal status:  INITIAL  Target date:  08/06/2023  8.  Pt will report she is able to use her L UE more with daily activities by at least 50% without significant pain.   Goal status:  INITIAL  Target date:  08/06/2023     PLAN:  PT FREQUENCY: 2x/week  PT DURATION: 5 Weeks  PLANNED INTERVENTIONS: 97164- PT Re-evaluation, 97110-Therapeutic exercises, 97530- Therapeutic activity, [id number]- Neuromuscular re-education, 97535- Self Care, 16109- Manual  therapy, [id number]- Gait training, [phone]- Aquatic Therapy, [phone]- Ultrasound, Patient/Family education, Balance training, Stair training, Taping, Dry Needling, Cryotherapy, and Moist heat  PLAN FOR NEXT SESSION:  review/update HEP PRN.  Cont with gait, ambulation, balance, functional endurance, and  LE strengthening.  Monitoring activity tolerance via borg dyspnea scale and/or vitals. NO e-stim STM to medial scap mm and thoracic and lumbar paraspinals.  Rows with theraband.  Shoulder pulleys   Fredia Janus PT, DPT 07/17/23  10:53 AM

## 2023-07-21 ENCOUNTER — Ambulatory Visit (INDEPENDENT_AMBULATORY_CARE_PROVIDER_SITE_OTHER): Admitting: Family Medicine

## 2023-07-21 ENCOUNTER — Other Ambulatory Visit: Payer: Self-pay

## 2023-07-21 VITALS — BP 168/78 | HR 74 | Ht 62.0 in | Wt 130.0 lb

## 2023-07-21 DIAGNOSIS — M546 Pain in thoracic spine: Secondary | ICD-10-CM | POA: Diagnosis not present

## 2023-07-21 DIAGNOSIS — G8929 Other chronic pain: Secondary | ICD-10-CM | POA: Diagnosis not present

## 2023-07-21 NOTE — Progress Notes (Signed)
 Joanna Muck, PhD, LAT, ATC acting as a scribe for Kristen Juniper, MD.  Kristen Suarez is a 78 y.o. female who presents to [facility] [place] today for 13-month f/u L rhomboid pain. Pt was last seen by Dr. Alease Hunter on 06/20/23 and was advised to cont HEP and PT, completing 13 visits.   Today, pt reports upper back pain is getting better, 50% improvement. PT massaged the area, but did not try dry needling.   Dx testing: 05/20/23 L-spine XR  05/01/23 DEXA scan  Pertinent review of systems: No fevers or chills  Relevant historical information: Significant lung disease with restrictive lung disease and scarring secondary to sarcoidosis.   Exam:  BP (!) 168/78   Pulse 74   Ht 5\' 2"  (1.575 m)   Wt 130 lb (59 kg)   SpO2 100%   BMI 23.78 kg/m  General: Well Developed, well nourished, and in no acute distress.   MSK: Left posterior chest wall tender palpation around rhomboid.  Decreased motion.    Lab and Radiology Results  EXAM: CT CHEST WITHOUT CONTRAST   TECHNIQUE: Multidetector CT imaging of the chest was performed following the standard protocol without IV contrast.   RADIATION DOSE REDUCTION: This exam was performed according to the departmental dose-optimization program which includes automated exposure control, adjustment of the mA and/or kV according to patient size and/or use of iterative reconstruction technique.   COMPARISON:  Chest x-ray Jul 23, 2022 CT cardiac November 20, 2021, CT chest December 29, 2020   FINDINGS: Cardiovascular: No significant vascular findings. Normal heart size. No pericardial effusion. Extensive coronary artery calcifications   Mediastinum/Nodes: Stable multiple calcified mediastinal and hilar lymph nodes no new mediastinal masses or adenopathy.   Lungs/Pleura: Comparison with prior examinations demonstrates no significant change in the extensive fibro atelectatic fibrocalcific changes of both lungs with  significant bilateral bronchiectasis and scar changes involving particularly the upper and mid lung distributions with significant central pulmonary retraction and traction bronchiectasis and architectural distortion. These findings correlate with chronic interstitial lung disease and correlate with sarcoidosis stage IV. No new significant findings. No acute infiltrates or consolidations.   Upper Abdomen: No acute abnormality.   Musculoskeletal: No chest wall mass or suspicious bone lesions identified.   IMPRESSION: *Stable extensive fibro atelectatic fibrocalcific changes of both lungs with significant bilateral bronchiectasis and scar changes involving particularly the upper and mid lung distributions with significant central pulmonary retraction and traction bronchiectasis and architectural distortion. These findings correlate with chronic interstitial lung disease and correlate with sarcoidosis stage IV. *No new significant findings. No acute infiltrates or consolidations. *Stable multiple calcified mediastinal and hilar lymph nodes.     Electronically Signed   By: Fredrich Jefferson M.D.   On: 05/26/2023 10:17   I, Kristen Suarez, personally (independently) visualized and performed the interpretation of the images attached in this note.      Assessment and Plan: 78 y.o. female with left posterior chest wall pain around the rhomboid.  About 50% improvement with physical therapy.  She has about a month left of physical therapy scheduled.  Next step would be either dry needling or trigger point injection.  I think this would be potentially a pretty good idea but it is a bit risky.  If she has a accidental needle injury which causes pneumothorax and potentially would be quite bad and may require chest tube which would be life-threatening.  Recommend giving PT another month.  If not better consider trigger  point injection in my office.   PDMP not reviewed this encounter. No orders of  the defined types were placed in this encounter.  No orders of the defined types were placed in this encounter.    Discussed warning signs or symptoms. Please see discharge instructions. Patient expresses understanding.   The above documentation has been reviewed and is accurate and complete Kristen Suarez, M.D.

## 2023-07-21 NOTE — Patient Instructions (Addendum)
Thank you for coming in today.   Continue physical therapy  Check back in 1 month

## 2023-07-22 ENCOUNTER — Ambulatory Visit (HOSPITAL_BASED_OUTPATIENT_CLINIC_OR_DEPARTMENT_OTHER): Payer: Self-pay | Admitting: Physical Therapy

## 2023-07-22 ENCOUNTER — Encounter (HOSPITAL_BASED_OUTPATIENT_CLINIC_OR_DEPARTMENT_OTHER): Payer: Self-pay | Admitting: Physical Therapy

## 2023-07-22 DIAGNOSIS — R293 Abnormal posture: Secondary | ICD-10-CM

## 2023-07-22 DIAGNOSIS — M5459 Other low back pain: Secondary | ICD-10-CM

## 2023-07-22 DIAGNOSIS — M6281 Muscle weakness (generalized): Secondary | ICD-10-CM

## 2023-07-22 DIAGNOSIS — R262 Difficulty in walking, not elsewhere classified: Secondary | ICD-10-CM

## 2023-07-22 DIAGNOSIS — M546 Pain in thoracic spine: Secondary | ICD-10-CM

## 2023-07-22 NOTE — Therapy (Signed)
 OUTPATIENT PHYSICAL THERAPY TREATMENT         Patient Name: Kristen Suarez MRN: [id number] DOB:[birth year], 78 y.o., female Today's Date: 07/23/2023  END OF SESSION:  PT End of Session - 07/22/23 0854     Visit Number 14    Number of Visits 20    Date for PT Re-Evaluation 08/07/23    Authorization Type MCR A and B    PT Start Time 0852    PT Stop Time 0935    PT Time Calculation (min) 43 min    Activity Tolerance Patient tolerated treatment well    Behavior During Therapy [facility] for tasks assessed/performed                       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/1976   Weight loss, unintentional 12/31/2022   Past Surgical History:  Procedure Laterality Date   ABDOMINAL HYSTERECTOMY  1987   BASAL CELL CARCINOMA EXCISION  06/30/2020   nose   CARDIAC CATHETERIZATION     CLIPPING OF ATRIAL APPENDAGE N/A 01/04/2022   Procedure: CLIPPING OF ATRIAL APPENDAGE USING SIZE 40 ATRICURE ATRIAL CLIP;  Surgeon: Eleanora Grew, MD;  Location: [facility] OR;  Service: Open Heart Surgery;  Laterality: N/A;   CORONARY ARTERY BYPASS GRAFT N/A 01/04/2022   Procedure: CORONARY ARTERY BYPASS GRAFTING (CABG) x  USING ENDOSCOPIC GREATER SAPHENOUS VEIN HARVEST;  Surgeon: Eleanora Grew, MD;  Location: [facility] OR;  Service: Open Heart Surgery;  Laterality: N/A;   LEFT HEART CATH AND CORONARY ANGIOGRAPHY N/A 01/02/2022   Procedure: LEFT HEART CATH AND CORONARY ANGIOGRAPHY;  Surgeon: Millicent Ally, MD;  Location: [facility] INVASIVE CV LAB;  Service: Cardiovascular;  Laterality: N/A;   LUNG BIOPSY   06/16/1976   PERIPHERAL VASCULAR INTERVENTION Right 11/29/2020   Procedure: PERIPHERAL VASCULAR INTERVENTION;  Surgeon: Wenona Hamilton, MD;  Location: [facility] INVASIVE CV LAB;  Service: Cardiovascular;  Laterality: Right;  Renal Artery   RENAL ANGIOGRAPHY N/A 11/29/2020   Procedure: RENAL ANGIOGRAPHY;  Surgeon: Wenona Hamilton, MD;  Location: [facility] INVASIVE CV LAB;  Service: Cardiovascular;  Laterality: N/A;   TEE WITHOUT CARDIOVERSION N/A 01/04/2022   Procedure: TRANSESOPHAGEAL ECHOCARDIOGRAM (TEE);  Surgeon: Eleanora Grew, MD;  Location: [facility] OR;  Service: Open Heart Surgery;  Laterality: N/A;   Patient Active Problem List   Diagnosis Date Noted   PAF (paroxysmal atrial fibrillation) (HCC) 07/02/2023   Memory impairment 03/21/2023   Adjustment insomnia 08/19/2022   Nocturia more than twice per night 08/19/2022   Bilateral sensorineural hearing loss 06/27/2022   Hearing loss of right ear 02/04/2022   Hx of CABG 01/04/2022   Elevated coronary artery calcium  score    Chronic kidney disease, stage 3b (HCC) 01/01/2022   Syncope 12/31/2021   Renal artery stenosis (HCC) 09/25/2021   Aortic valve sclerosis 09/25/2021   Snoring 04/13/2021   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   Epistaxis 03/29/2021   Coronary artery disease due to calcified coronary  lesion 08/15/2020   Basal cell carcinoma 06/30/2020   History of loop recorder 12/13/2019   Allergic rhinitis 12/08/2019   Chronic cough 11/08/2019   Post-nasal drip 11/08/2019   Multiple joint pain 04/22/2019   Bilateral leg edema 04/22/2019   Vaginal atrophy 01/05/2019   Resting tremor 09/08/2018   Sarcoidosis 05/08/2018   HTN (hypertension) 05/08/2018   Colon polyp 05/08/2018   Family hx of colon cancer 05/08/2018   Cystocele, midline 05/08/2018   Hyperlipidemia 05/08/2018   Vasovagal syncope 05/08/2018   Seborrheic keratosis 05/08/2018   Pseudophakia of both eyes 05/08/2018   GERD (gastroesophageal reflux disease) 05/08/2018   COPD  (chronic obstructive pulmonary disease) (HCC) 05/08/2018   Osteoporosis 12/31/2016     REFERRING PROVIDER:  Clearnce Curia, NP    Syliva Even, MD      REFERRING DIAG:  [phone] (ICD-10-CM) - Chronic bilateral low back pain without sciatica  M54.6,G89.29 (ICD-10-CM) - Chronic left-sided thoracic back pain   R06.09 (ICD-10-CM) - Exertional dyspnea              R53.81 (ICD-10-CM) - Physical deconditioning  THERAPY DIAG:  Muscle weakness (generalized)  Pain in thoracic spine  Other low back pain  Difficulty in walking, not elsewhere classified  Abnormal posture  Rationale for Evaluation and Treatment: Rehabilitation  ONSET DATE: PT order 04/24/2023  SUBJECTIVE:   SUBJECTIVE STATEMENT:  Pt states she did well last PT and had no adverse effects.  Pt saw MD yesterday and he spoke to her about aquatic therapy and dry needling.  PT received message from MD concerning trying aquatic therapy and from MD office concerning trying dry needling.  Pt reports she had some dry needling in 2023 for the same spot which helped.  Pt wants to try dry needling.   Pt reports 50% improvement in pain and is still frustrated about the pain.  Pt states her pain is not as acute in the past week.  She is using less biofreeze.  Pt reports she can do about 25 mins of activity before getting SOB.  Pt feels that her SOB is a little worse over the past 6 weeks.  Pt states she is walking with more confidence though is moving a little too fast at times.  Pt reports compliance with HEP and is using her pulleys at home.  Pt has a neurology appt tomorrow.   PERTINENT HISTORY: Chronic thoracic pain and lumbar pain CAD s/p CABG x 4 on 01/04/2022 Osteoporosis syncope, pulmonary sarcoidosis, implanted loop recorder Arthritis and HTN Memory problems since CABG surgery--she is being followed by neurology.    PAIN:  3/10 L scapular pain current, 10/10 worst, 1/10 best Pt doesn't know what increases  her pain.              0/10 R scapular pain, 5-6/10 worst 0/10 lumbar currently ; 9-10/10 worst.  Pt states her lumbar feels strained, though no pain.   Easing factors:  heating pad, tylenol  Aggravating factors:  Pt not sure what increases her sx's.   PRECAUTIONS: Other: Implanted loop recorder, osteoporosis, balance, CABG, syncope    WEIGHT BEARING RESTRICTIONS: No  FALLS:  Has patient fallen in last 6 months? Yes. Number of falls 1 on 02/23/2023, syncope  LIVING ENVIRONMENT: Lives with: lives with their spouse Lives in:  1 story home Stairs: 2-4 steps to enter/exit home with rail Has following equipment at home: Single point cane, Walker - 4 wheeled, and walking sticks  OCCUPATION: Pt is retired  PLOF: Independent  PATIENT GOALS: to walk a straight line, to have improved activity tolerance after a short rest break, be more flexible    OBJECTIVE:  Note: Objective measures were completed at Evaluation unless otherwise noted.  DIAGNOSTIC FINDINGS:  Lumbar X rays on 3/4: FINDINGS: Five lumbar type vertebral bodies are well visualized. Vertebral body height is well maintained. Disc space narrowing is noted at L5-S1. No anterolisthesis is seen. No soft tissue abnormality is noted. Right renal artery stent is seen.   IMPRESSION: Degenerative change without acute abnormality.   CT chest on 3/5: IMPRESSION: *Stable extensive fibro atelectatic fibrocalcific changes of both lungs with significant bilateral bronchiectasis and scar changes involving particularly the upper and mid lung distributions with significant central pulmonary retraction and traction bronchiectasis and architectural distortion. These findings correlate with chronic interstitial lung disease and correlate with sarcoidosis stage IV. *No new significant findings. No acute infiltrates or consolidations. *Stable multiple calcified mediastinal and hilar lymph nodes.Per MD note, Lumbar X-rays showed DDD worse  L5-S1.  No acute fractures are visible.   TREATMENT DATE:  5/6 POSTURE:  Significant kyphosis and FHP   VITALS:   At beginning of Rx:      O2:  99%         HR:  75  Reviewed current function, pt presentation response to prior Rx, and pain level   Sit to stands from chair with hands on knees 2x10 reps    Ther-Ex: Nustep x 8 mins with a rest break at 4 mins            L4 x 4 mins:  HR:  quickly reduced to 79 as pt was sitting at rest, O2: 99%.            L4 x 4 mins:  HR:  82-84, O2:  99%   Pt ambulated 518 ft and became SOB.  O2:  99%, HR:  86.        O2:  99%, HR:  74 after sitting and resting  Manual Therapy:  STM to L medial scap mm, post scap, and thoracic paraspinals in sitting.  PT educated pt in using a theracane to medial scap mm.  Pt used theracane in sitting with cuing.                                                                                       PATIENT EDUCATION:  Education details:  PT answered pt's questions.  posture, exercise form, rationale for interventions, vitals, relevant anatomy, HEP, what to expect next Rx, and POC.  Person educated: Patient Education method: Explanation, Demonstration, Tactile cues, Verbal cues Education comprehension: verbalized understanding, returned demonstration, verbal cues required, tactile cues required, and needs further education      HOME EXERCISE PROGRAM: Access Code: [email or web address] URL: [email or web address] Date: 05/29/2023 Prepared by: Mayme Spearman  Exercises - Standing Tandem Balance with Counter Support  - 2-3 x daily - 1-2 sets - 1-2 reps - 30sec hold - Side Stepping with Counter Support  - 2-3 x daily - 1 sets - 8 reps - Backward Walking with Counter Support  - 2-3 x daily - 1 sets -  8 reps   - Seated Scapular Retraction  - 2 x daily - 7 x weekly - 1-2 sets - 10 reps - Standing Shoulder Row with Anchored Resistance  - 1 x daily - 3-4 x weekly - 2 sets - 10 reps  ASSESSMENT:  CLINICAL  IMPRESSION: Pt reports 50% improvement in pain and is still frustrated about the pain.  Pt states her pain is not as acute in the past week.  Pt reports her energy level is about the same though states her SOB is a little worse over the past 6 weeks.  Pt reports having more confidence with walking.  PT worked on functional endurance and tolerance with mobility today.  She became very fatigued with ambulating laps around clinic having increased SOB.  PT had pt stop before completing the 2nd lap.  PT performed STM to L medial scap mm and thoracic paraspinals and pt was tender to in L medial scap.  PT instructed pt in using and demonstrated proper usage of the theracane to improve soft tissue tightness and mobility.  MD and office sent messages to PT about pt performing aquatic therapy and dry needling.  PT discussed those interventions with pt.  PT will plan to perform aquatic therapy next visit.  Pt should benefit from cont skilled PT to improve pain, strength, soft tissue tightness, posture, tolerance to activity, functional endurance, and mobility.         OBJECTIVE IMPAIRMENTS: decreased activity tolerance, decreased balance, decreased endurance, decreased mobility, difficulty walking, decreased strength, and pain.   ACTIVITY LIMITATIONS: standing, squatting, transfers, and locomotion level  PARTICIPATION LIMITATIONS: cleaning, shopping, and community activity  PERSONAL FACTORS: 3+ comorbidities: osteoporosis, CABG, arthritis, syncope, pulmonary sarcoidosis  are also affecting patient's functional outcome.   REHAB POTENTIAL: Good  CLINICAL DECISION MAKING: Stable/uncomplicated  EVALUATION COMPLEXITY: Low   GOALS:  SHORT TERM GOALS: Target date: 06/11/2023  Pt will be independent and compliant with HEP for improved strength, tolerance to activity, functional endurance, pain, and mobility.   Baseline: 06/12/23: HEP ongoing Goal status: ONGOING  2.  Pt will report at least a 25% improvement  in tolerance to activity and her functional mobility.  Baseline:  06/12/23: reports 35-40% improvement, particularly with housework Goal status: MET  3.  Pt will perform 5x STS test in no > than 19 sec for improved functional LE strength and performance of transfers.   Baseline:  06/12/23: 13.95sec no UE support Goal status: MET  4.  Pt will ambulate entire 6 MWT test.   Baseline:  Goal status: ONGOING /  not assessed  5.  Pt will report at least a 25% improvement in thoracic and lumbar pain and sx's overall.  Goal status:  INITIAL  Target date:  07/16/2023    LONG TERM GOALS: Target date: 08/07/2023  Pt will report at least a 70% improvement in her tolerance with daily activities and functional endurance with mobility.    Baseline:    Goal status: 70% MET  2.  Pt will increase ambulation distance by at least 150 ft on 3 MWT.  Baseline:  Goal status: 20% MET  3.  Pt will report increased ambulation distance with reduced fatigue and SOB. Baseline:  Goal status: PROGRESSING  4.  Pt will perform 5x STS test in < than 16 sec for improved functional LE strength and performance of transfers.  Baseline:  Goal status: GOAL MET  3/27  5.  Pt will demo improved bilat LE strength to 4+/5 in hip  flexion and 5/5 in knee extension and at least 6-8# increase in abd for improved performance of functional mobility and performance of household chores. Baseline:  Goal status: INITIAL  6.  Pt will report at least a 70% improvement in her thoracic and lumbar pain overall.    Goal status:  INITIAL  Target date:  08/06/2023  7.  Pt will demo improved L shoulder flexion/abd AROM by 15/10 deg for improved UE elevation and reaching.   Goal status:  INITIAL  Target date:  08/06/2023  8.  Pt will report she is able to use her L UE more with daily activities by at least 50% without significant pain.   Goal status:  INITIAL  Target date:  08/06/2023     PLAN:  PT FREQUENCY: 2x/week  PT  DURATION: 5 Weeks  PLANNED INTERVENTIONS: 97164- PT Re-evaluation, 97110-Therapeutic exercises, 97530- Therapeutic activity, [id number]- Neuromuscular re-education, 97535- Self Care, 78469- Manual therapy, [id number]- Gait training, [phone]- Aquatic Therapy, [phone]- Ultrasound, Patient/Family education, Balance training, Stair training, Taping, Dry Needling, Cryotherapy, and Moist heat  PLAN FOR NEXT SESSION:  review/update HEP PRN.  Cont with gait, ambulation, balance, functional endurance, and  LE strengthening.  Monitoring activity tolerance via borg dyspnea scale and/or vitals. NO e-stim STM to medial scap mm and thoracic and lumbar paraspinals.  Rows with theraband.  Shoulder pulleys Aquatic therapy next visit.  Assess response/tolerance.   Trina Fujita III PT, DPT 07/23/23 2:56 PM

## 2023-07-23 ENCOUNTER — Ambulatory Visit: Payer: Self-pay

## 2023-07-23 ENCOUNTER — Ambulatory Visit (INDEPENDENT_AMBULATORY_CARE_PROVIDER_SITE_OTHER): Admitting: Psychology

## 2023-07-23 DIAGNOSIS — R4189 Other symptoms and signs involving cognitive functions and awareness: Secondary | ICD-10-CM | POA: Diagnosis not present

## 2023-07-23 NOTE — Progress Notes (Signed)
   Psychometrician Note   Cognitive testing was administered to Morning M Wirz by Abbott Abbot, B.S. (psychometrist) under the supervision of Dr. Janice Meeter, Psy.D., licensed psychologist on 07/23/2023. Ms. Resendes did not appear overtly distressed by the testing session per behavioral observation or responses across self-report questionnaires. Rest breaks were offered.    The battery of tests administered was selected by Dr. Janice Meeter, Psy.D. with consideration to Ms. Rhames's current level of functioning, the nature of her symptoms, emotional and behavioral responses during interview, level of literacy, observed level of motivation/effort, and the nature of the referral question. This battery was communicated to the psychometrist. Communication between Dr. Janice Meeter, Psy.D. and the psychometrist was ongoing throughout the evaluation and Dr. Janice Meeter, Psy.D. was immediately accessible at all times. Dr. Janice Meeter, Psy.D. provided supervision to the psychometrist on the date of this service to the extent necessary to assure the quality of all services provided.    Yasna Torosyan Sturdy will return within approximately 1-2 weeks for an interactive feedback session with Dr. Donavon Fudge at which time her test performances, clinical impressions, and treatment recommendations will be reviewed in detail. Ms. Joost understands she can contact our office should she require our assistance before this time.  A total of 125 minutes of billable time were spent face-to-face with Ms. Tomas by the psychometrist. This includes both test administration and scoring time. Billing for these services is reflected in the clinical report generated by Dr. Janice Meeter, Psy.D.  This note reflects time spent with the psychometrician and does not include test scores or any clinical interpretations made by Dr. Donavon Fudge. The full report will follow in a separate note.

## 2023-07-23 NOTE — Progress Notes (Signed)
 NEUROPSYCHOLOGICAL EVALUATION Kristen Suarez. [facility]  [place] Department of Neurology  Date of Evaluation: 07/23/2023  REASON FOR REFERRAL   Kristen Suarez is a 78 year old, right-handed, White female with 18 years of formal education. She was referred for neuropsychological evaluation by Kristen Filbert, PA-C, to assess current neurocognitive functioning, document potential cognitive deficits, and assist with treatment planning. This is her first neuropsychological evaluation.  SUMMARY OF RESULTS   Premorbid cognitive abilities are estimated to be in the average range based on word reading and sociodemographic factors. Given this estimate, scores today were generally within age-related expectations, with the exception of relative weaknesses in verbal fluency and fine motor dexterity in her nondominant hand. Performance on remaining measures--including attention/working memory, processing speed, executive functioning, confrontation naming, dominant hand fine motor dexterity, visuospatial ability, and learning/memory--was intact and, at times, exceeded expectations. On self-report questionnaires, she did not endorse clinically-elevated levels of depression, anxiety, or excessive daytime sleepiness.  DIAGNOSTIC IMPRESSION   Results of the current evaluation were generally within age-related expectations, with the exception of isolated weaknesses on tasks of verbal fluency and fine motor dexterity in her nondominant hand. She does not show evidence of a neurocognitive disorder at this time. Etiology of verbal fluency difficulties is unclear. However, in the absence of neuroimaging findings and considering her other normal language scores, this is not overly concerning at present, though monitoring over time is recommended. Subjective cognitive concerns are most likely related to recent stress/anxiety, hearing loss, and mild cerebrovascular changes. It is encouraging that she has demonstrated  significant improvement since the initial decline following surgery and that her husband does not report any major concerns at this time. Results provide a useful baseline for future comparison. Recommendations are listed below.  ICD-10 Codes: R41.89 Cognitive changes  RECOMMENDATIONS   In consultation with your doctor, schedule cognitive reevaluation on an as-needed basis to assess for cognitive decline and update treatment recommendations. Reevaluation should occur during a period of medical and affective stability.  Patient is encouraged to continue managing vascular risk factors through a heart healthy diet, physician-approved physical activity, and medication adherence.  Continue participating in activities that you find enjoyable and fulfilling, whether that be hobbies, socializing with loved ones, or being outdoors. This can improve mood, increase motivation, and offer cognitive stimulation.  Consider implementing compensatory strategies to maximize independence and maintain daily functioning. Examples include:   -Adhere to routine. Compensatory strategies work best when they are used consistently. Use a planner, calendar, or white board that has the schedule and important events for the day clearly listed to reference and cross off when tasks are complete.   -Ask for written information, especially if it is new or unfamiliar (e.g., information provided at a doctor's appointment).   -Create an organized environment. Keep items that can be easily misplaced in a sensible location and get into the habit of always returning the items to those places.   -Pay attention and reduce distractions. Make a point of focusing attention on information you want to remember. One-on-one interaction is more likely to facilitate attention and minimize distraction. Make eye contact and repeat the information out loud after you hear it. Reduce interruptions or distractions especially when attempting to learn  new information.   -Create associations. When learning something new, think about and understand the information. Explain it in your own words or try to associate it with something you already know. Take notes to help remember important details.  -Evaluate goals and plan accordingly. When confronted  by many different tasks, begin by making a list that prioritizes each task and estimates the time it will take to complete. Break down complicated tasks into smaller, more manageable steps.   -Focus on one task at a time and complete each task before starting another. Avoid multitasking.  DISPOSITION   Patient will follow up with the referring provider, Kristen Suarez. No follow-up neuropsychological testing was scheduled at this time. Please feel free to refer the patient for repeated evaluation if she shows a significant change in neurocognitive status. She and her husband will be provided verbal feedback in approximately one week regarding the findings and impression during this visit.  The remainder of the report includes the details of the patient's background and a table of results from the current evaluation, which support the summary and recommendations described above.  BACKGROUND   History of Presenting Illness: The following information was obtained from a review of medical records and an interview with the patient and her husband, Kristen Suarez. 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 MoCA = 25/30. A neuropsychological evaluation was requested accordingly.  Cognitive Functioning: During today's appointment, the patient reported ongoing cognitive concerns following CABG in October 2023, noting relative improvement but not a full return to baseline. She specifically endorsed short-term memory problems (e.g., forgetting recipes, relying more on lists/written reminders) and mild word-finding difficulties. She denied significant  problems with attention, processing speed, comprehension, navigation, and executive functioning (e.g., planning, organizing). Her husband stated that "[he does] not see anything really concerning." He noted that she "was a mess after surgery" but has improved significantly since then. He reported a few occasional lapses in attention--such as leaving the cooktop on or allowing something to boil over--but otherwise expressed no major concerns  Physical Functioning: Patient reported improved sleep with trazodone , averaging 7-8 hours per night. She believes her pulmonologist informed her that her sleep apnea has resolved, and she therefore no longer uses CPAP. She experienced several months of nightmares following surgery, though these have since resolved. Her appetite is variable, and she has been working with a Data processing manager. Vision has remained stable since cataract surgery in 2018. She reported hearing loss in her right ear following surgery and now wears hearing aids. Balance difficulties are currently being addressed in physical therapy, and she has not had a fall since last December. She occasionally experiences right-handed tremors in the morning, which typically resolve as the day progresses. Headaches have improved and are now infrequent.  Emotional Functioning: Patient described herself as generally positive and "cheery," though she has experienced a little more stress/anxiety lately due to situational factors (e.g., physical health, wellbeing of her children, condition of the country). She denied suicidal ideation. She remains engaged in several hobbies, including reading, gardening, and going to church.  Imaging: MRI of the brain (04/13/2023) documented mild small vessel change of the cerebral hemispheric white matter.  Other Relevant Medical History: Remarkable for paroxysmal atrial fibrillation, hypertension, hyperlipidemia, coronary artery disease, aortic valve sclerosis, renal artery stenosis,  chronic kidney disease stage III, sarcoidosis, chronic obstructive pulmonary disease, gastroesophageal reflux disease, osteoporosis, hearing loss, and h/o basal cell carcinoma. No history of stroke, CNS infection, head injury, or seizure was reported.  Current Medications and Vitamins/Supplements: Per record, albuterol , amlodipine , aspirin , Breztri  Aerosphere, calcium  carbonate, cetirizine, cranberry, cyanocobalamin, denosumab , docusate sodium , evolocumab , fluticasone , magnesium , metoprolol , multiple vitamins-minerals, pantoprazole , probiotic, rosuvastatin , sodium chloride  nasal spray, trazodone , and valsartan .  Functional Status: Patient independently performs all basic  and instrumental activities of daily living. She continues to drive but does so less frequently, as her husband typically handles most of the driving. She denied any concerns with navigation or driving ability. Her husband manages the finances, but this has been their arrangement for some time. Since the surgery, she has resumed managing her medications independently and reports no difficulties. She continues to prepare meals, although she occasionally relies on recipes due to difficulty recalling them from memory.  Family Neurological History: Unremarkable.  Psychiatric History: With the exception of a brief episode of depression in 1973, during which she saw a psychologist, she denies a history of depression, anxiety, prior mental health treatment, suicidal ideation, hallucinations, and psychiatric hospitalizations.  Substance Use History: Patient reported infrequent alcohol consumption. Current use of nicotine, marijuana, and illicit substances was denied.  Social and Developmental History: Patient was born in [place], [place]. History of perinatal complications and developmental delays was not reported. Her primary language is [place], but she began learning English early in grade school and now speaks English exclusively in her daily  life. She has been married for approximately 57 years and has three children. She currently lives with her husband in their private residence.  Educational and Occupational History: No history of childhood learning disability, special education services, or grade retention was reported. Patient scribes herself as always having a "appetite for learning." She obtained a master's degree in [employer] later in life when her children were in college. She was employed in various roles within [employer] systems for the duration of her career, including Warehouse manager, Runner, broadcasting/film/video, and Merchandiser, retail.  BEHAVIORAL OBSERVATIONS   Patient arrived on time and was accompanied by her husband, Kristen Suarez. She ambulated independently and without gait disturbance. She was alert and fully oriented. She was appropriately groomed and dressed for the setting. No significant sensory or motor abnormalities were observed. Vision and hearing (with hearing aids) were adequate for testing purposes. Speech was of normal rate, prosody, and volume. No conversational word-finding difficulties, paraphasic errors, or dysarthria were observed. Comprehension was conversationally intact. Thought processes were linear, logical, and coherent. Thought content was organized and devoid of delusions. Insight appeared appropriate. Affect was even and congruent with euthymic mood. She was cooperative and gave adequate effort during testing, including on standalone and embedded measures of performance validity. Results are thought to accurately reflect her cognitive functioning at this time.  NEUROPSYCHOLOGICAL TESTING RESULTS   Tests Administered: Animal Naming Test; Brief Visuospatial Memory Test-Revised (BVMT-R) - Form 1; Controlled Oral Word Association Test (COWAT): FAS; Delis-Kaplan Executive Function System (D-KEFS) - Subtest(s): Color-Word Interference Test; Epworth Sleepiness Scale (ESS); Geriatric Anxiety Scale-10 Item  (GAS-10); Geriatric Depression Scale Short Form (GDS-SF); Grooved Pegboard Test; [employer] Verbal Learning Test Revised (HVLT-R) - From 1; Judgment of Line Orientation (JLO) - Form V; Neuropsychological Assessment Battery (NAB) - Subtest(s): Naming Form 1; Rey Complex Figure Test (RCFT) - copy only; Standalone performance validity tests (PVTs); Test of Premorbid Functioning (TOPF); Trail Making Test (TMT); Wechsler Adult Intelligence Scale Fifth Edition (WAIS-5) - Subtest(s): Similarities, Clinical cytogeneticist, Matrix Reasoning, Digit Sequencing, Coding, Running Digits, Symbol Search, Symbol Span; and Wechsler Memory Scale Fourth Edition (WMS-IV) - Subtest(s): Logical Memory (LM).  Test results are provided in the table below. Whenever possible, the patient's scores were compared against age-, sex-, and education-corrected normative samples. Interpretive descriptions are based on the AACN consensus conference statement on uniform labeling (Guilmette et al., 2020).  PREMORBID FUNCTIONING RAW  RANGE  TOPF 43  StdS=101 Average  ATTENTION & WORKING MEMORY RAW  RANGE  WAIS-5 Digit Sequencing -- ss=8 Average  WAIS-5 Running Digits -- ss=8 Average  WAIS-5 Symbol Span -- ss=11 Average  PROCESSING SPEED RAW  RANGE  Trails A 48''0e T=39 Low Average  WAIS-5 Coding  -- ss=11 Average  WAIS-5 Symbol Search -- ss=9 Average  DKEFS CWIT Color Naming 36''1e ss=9 Average  DKEFS CWIT Word Reading 31''0e ss=7 Low Average  EXECUTIVE FUNCTION RAW  RANGE  Trails B 131''1e T=37 Low Average  WAIS-5 Matrix Reasoning -- ss=7 Low Average  WAIS-5 Similarities -- ss=11 Average  COWAT Letter Fluency 11+5+11 T=34 Below Average  DKEFS CWIT Inhibition 64''0e ss=11 Average  DKEFS CWIT Inhibition/Switching 89''5e ss=9 Average  LANGUAGE RAW  RANGE  COWAT Letter Fluency 11+5+11 T=34 Below Average  Animal Naming Test 10 T=23 Exceptionally Low  NAB Naming Test 31/31 T=60 WNL  VISUOSPATIAL RAW    WAIS-5 Block Design -- ss=8 Average  WAIS-5  Matrix Reasoning -- ss=7 Low Average  JLO C/S=20/30 9%ile Low Average  HVOT 24.5/30 T=48 Average  RCFT Copy 33/36 >16 Exceptionally High  BVMT-R Copy Trial 11/12 -- WNL  VERBAL LEARNING & MEMORY RAW  RANGE  HVLT Learning Trials (5+8+9)/36 T=48 Average  HVLT Delayed Recall 10/12 T=56 Average  HVLT Recognition Hits 12 -- --  HVLT Recognition False Positives 0 -- --  HVLT Discrimination Index 12 T=60 High Average  WMS-IV LM-I  (8+14+8)/53 ss=10 Average  WMS-IV LM-II  (11+8)/39 ss=11 Average  WMS-IV LM Recognition  (7+11)/23 26-50%ile Average  VISUOSPATIAL LEARNING & MEMORY RAW  RANGE  BVMT-R Total Recall (3+6+10)/36 T=48 Average  BVMT-R Delayed Recall 9/12 T=55 Average  BVMT-R Percent Retained 90 >16%ile WNL  BVMT-R Recognition Hits 6 >16%ile WNL  BVMT-R Recognition False Alarms 0 >16%ile WNL  BVMT-R Recognition Discrimination Index 6 >16%ile WNL  FINE MOTOR DEXTERITY RAW  RANGE  Grooved Pegboard (Dominant Hand) 84''0d T=45 Average  Grooved Pegboard (Non-Dominant Hand) 134''4d T=34 Below Average  QUESTIONNAIRES RAW  RANGE  GDS-SF 2 -- Minimal  GAS-10 5 -- Minimal  ESS 3 -- WNL  *Note: ss = scaled score; StdS = standard score; T = t-score; C/S = corrected raw score; WNL = within normal limits; BNL= below normal limits; D/C = discontinued. Scores from skewed distributions are typically interpreted as WNL (>=16th %ile) or BNL (<16th %ile).   INFORMED CONSENT   Patient was provided with a verbal description of the nature and purpose of the neuropsychological evaluation. Also reviewed were the foreseeable risks and/or discomforts and benefits of the procedure, limits of confidentiality, and mandatory reporting requirements of this provider. Patient was given the opportunity to have their questions answered. Oral consent to participate was provided by the patient.   This report was prepared as part of a clinical evaluation and is not intended for forensic use.  SERVICE   This evaluation  was conducted by Janice Meeter, Psy.D. In addition to time spent directly with the patient, total professional time (120 minutes) includes record review, integration of relevant medical history, test selection, interpretation of findings, and report preparation. A technician, Abbott Abbot, B.S., provided testing and scoring assistance for 125 minutes.  Psychiatric Diagnostic Evaluation Services (Professional): [phone] x 1 Neuropsychological Testing Evaluation Services (Professional): [phone] x 1 Neuropsychological Testing Evaluation Services (Professional): [phone] x 1 Neuropsychological Test Administration and Scoring Radiographer, therapeutic): [phone] x 1 Neuropsychological Test Administration and Scoring (Technician): [phone] x 3  This report was generated using voice recognition software. While this document  has been carefully reviewed, transcription errors may be present. I apologize in advance for any inconvenience. Please contact me if further clarification is needed.            Janice Meeter, Psy.D.             Neuropsychologist

## 2023-07-24 ENCOUNTER — Ambulatory Visit (INDEPENDENT_AMBULATORY_CARE_PROVIDER_SITE_OTHER): Payer: Medicare Other | Admitting: Pulmonary Disease

## 2023-07-24 ENCOUNTER — Ambulatory Visit: Payer: Medicare Other | Admitting: Pulmonary Disease

## 2023-07-24 ENCOUNTER — Encounter: Payer: Self-pay | Admitting: Pulmonary Disease

## 2023-07-24 VITALS — BP 132/84 | HR 78 | Ht 62.0 in | Wt 128.0 lb

## 2023-07-24 DIAGNOSIS — D869 Sarcoidosis, unspecified: Secondary | ICD-10-CM | POA: Diagnosis not present

## 2023-07-24 DIAGNOSIS — J449 Chronic obstructive pulmonary disease, unspecified: Secondary | ICD-10-CM

## 2023-07-24 DIAGNOSIS — J42 Unspecified chronic bronchitis: Secondary | ICD-10-CM

## 2023-07-24 MED ORDER — ALBUTEROL SULFATE HFA 108 (90 BASE) MCG/ACT IN AERS
2.0000 | INHALATION_SPRAY | Freq: Four times a day (QID) | RESPIRATORY_TRACT | 6 refills | Status: AC | PRN
Start: 2023-07-24 — End: ?

## 2023-07-24 NOTE — Patient Instructions (Signed)
 Full PFT performed today.

## 2023-07-24 NOTE — Patient Instructions (Signed)
 I will see you back in about 3 months  Your breathing study shows slight progression  Your CT scan shows stable findings compared to as far back as 5 years ago-scarring in the lungs  Continue regular activities  Continue your Breztri   Call us  with significant concerns

## 2023-07-24 NOTE — Progress Notes (Signed)
 Kristen Suarez    [id number]    01-18-1946  Primary Care Physician:Nche, Connye Delaine, NP  Referring Physician: Kandace Organ, NP [address] [place],  [place] [address]  Chief complaint:   Increased symptoms with congestion, cough  HPI:  History of sarcoidosis, obstructive lung disease, emphysema  Symptoms overall better  She does have some shortness of breath with activity Participating in physical therapy and seems to be improving  Coughing seems to be improved Not really bringing up anything at present  CABG in 2023  Has a history of severe obstructive lung disease, restrictive disease associated with sarcoidosis, significant scarring in the lungs  Uses Breztri  rescue inhalers as needed  Concerned about allergies, uses Zyrtec   Outpatient Encounter Medications as of 07/24/2023  Medication Sig   albuterol  (PROVENTIL ) (2.5 MG/3ML) 0.083% nebulizer solution TAKE 3 MLS (2.5 MG TOTAL) BY NEBULIZATION EVERY 4 (FOUR) HOURS AS NEEDED.   amLODipine  (NORVASC ) 10 MG tablet Take 1 tablet (10 mg total) by mouth daily.   aspirin  EC 81 MG tablet Take 1 tablet (81 mg total) by mouth daily. Swallow whole.   Budeson-Glycopyrrol-Formoterol  (BREZTRI  AEROSPHERE) 160-9-4.8 MCG/ACT AERO Inhale 2 puffs into the lungs in the morning and at bedtime.   calcium  carbonate (CALCIUM  600) 600 MG TABS tablet Take 600 mg by mouth daily with breakfast.   cetirizine (ZYRTEC) 10 MG tablet Take 10 mg by mouth daily.   CRANBERRY PO Take by mouth.   cyanocobalamin 1000 MCG tablet Take 1,000 mcg by mouth daily.   denosumab  (PROLIA ) 60 MG/ML SOSY injection Inject 60 mg into the skin every 6 (six) months.   docusate sodium  (COLACE) 100 MG capsule Take 100 mg by mouth daily.    Evolocumab  (REPATHA  SURECLICK) 140 MG/ML SOAJ INJECT 140 MG INTO THE SKIN EVERY 14 (FOURTEEN) DAYS.   famotidine  (PEPCID ) 40 MG tablet Take 40 mg by mouth at bedtime.   fluticasone  (FLONASE ) 50 MCG/ACT  nasal spray SPRAY 2 SPRAYS INTO EACH NOSTRIL EVERY DAY   MAGNESIUM  PO Take by mouth every other day.   metoprolol  succinate (TOPROL  XL) 25 MG 24 hr tablet Take 1 tablet (25 mg total) by mouth daily.   Multiple Vitamins-Minerals (MULTIVITAL PO) Take 1 tablet by mouth daily.    pantoprazole  (PROTONIX ) 40 MG tablet TAKE 1 TABLET BY MOUTH EVERY DAY   Probiotic Product (PROBIOTIC DAILY PO) Take 1 tablet by mouth daily.    rosuvastatin  (CRESTOR ) 40 MG tablet TAKE 1 TABLET BY MOUTH EVERY DAY   sodium chloride  (OCEAN) 0.65 % SOLN nasal spray Place 1 spray into both nostrils as needed for congestion.   traZODone  (DESYREL ) 50 MG tablet Take 0.5 tablets (25 mg total) by mouth at bedtime.   valsartan  (DIOVAN ) 40 MG tablet Take 1 tablet (40 mg total) by mouth daily.   Facility-Administered Encounter Medications as of 07/24/2023  Medication   [START ON 11/12/2023] denosumab  (PROLIA ) injection 60 mg    Allergies as of 07/24/2023 - Review Complete 07/24/2023  Allergen Reaction Noted   Keflex [cephalexin] Rash 08/27/2018   Penicillins Itching 04/14/2018   Sulfa antibiotics Itching 04/14/2018    Past Medical History:  Diagnosis Date   Abnormality of left atrial appendage 12/2021   s/p LAA clipping   Aortic valve sclerosis 09/25/2021   Arthritis    Asthma    CAD in native artery 08/15/2020   Chickenpox    Colon polyps 2010   Epistaxis 03/29/2021  Heart murmur    History of fainting spells of unknown cause    Hyperlipidemia    Hypertension    Renal artery stenosis (HCC) 09/25/2021   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/1976   Weight loss, unintentional 12/31/2022    Past Surgical History:  Procedure Laterality Date   ABDOMINAL HYSTERECTOMY  1987   BASAL CELL CARCINOMA EXCISION  06/30/2020   nose   CARDIAC CATHETERIZATION     CLIPPING OF ATRIAL APPENDAGE N/A 01/04/2022   Procedure: CLIPPING OF ATRIAL APPENDAGE USING SIZE 40 ATRICURE ATRIAL CLIP;  Surgeon: Eleanora Grew, MD;  Location:  [facility] OR;  Service: Open Heart Surgery;  Laterality: N/A;   CORONARY ARTERY BYPASS GRAFT N/A 01/04/2022   Procedure: CORONARY ARTERY BYPASS GRAFTING (CABG) x  USING ENDOSCOPIC GREATER SAPHENOUS VEIN HARVEST;  Surgeon: Eleanora Grew, MD;  Location: [facility] OR;  Service: Open Heart Surgery;  Laterality: N/A;   LEFT HEART CATH AND CORONARY ANGIOGRAPHY N/A 01/02/2022   Procedure: LEFT HEART CATH AND CORONARY ANGIOGRAPHY;  Surgeon: Millicent Ally, MD;  Location: [facility];  Service: Cardiovascular;  Laterality: N/A;   LUNG BIOPSY  06/16/1976   PERIPHERAL VASCULAR INTERVENTION Right 11/29/2020   Procedure: PERIPHERAL VASCULAR INTERVENTION;  Surgeon: Wenona Hamilton, MD;  Location: [facility];  Service: Cardiovascular;  Laterality: Right;  Renal Artery   RENAL ANGIOGRAPHY N/A 11/29/2020   Procedure: RENAL ANGIOGRAPHY;  Surgeon: Wenona Hamilton, MD;  Location: [facility];  Service: Cardiovascular;  Laterality: N/A;   TEE WITHOUT CARDIOVERSION N/A 01/04/2022   Procedure: TRANSESOPHAGEAL ECHOCARDIOGRAM (TEE);  Surgeon: Eleanora Grew, MD;  Location: [facility] OR;  Service: Open Heart Surgery;  Laterality: N/A;    Family History  Problem Relation Age of Onset   Arthritis Mother    Cancer Mother    Depression Mother    Heart attack Mother    Hyperlipidemia Mother    Hypertension Mother    Alcohol abuse Father    Heart disease Father    Hypertension Father    Hyperlipidemia Father    Heart disease Sister    Stroke Sister    Hyperlipidemia Sister    Alcohol abuse Sister    Hyperlipidemia Sister    Hypertension Sister    Alcohol abuse Brother    Heart disease Brother    Hypertension Brother    Hyperlipidemia Brother    Alcohol abuse Brother    Early death Brother    Heart attack Brother    Heart disease Brother    Hyperlipidemia Brother    Heart disease Maternal Grandfather    Heart disease Paternal Grandfather    Multiple sclerosis Daughter    Hyperlipidemia Son      Social History   Socioeconomic History   Marital status: Married    Spouse name: Not on file   Number of children: 3   Years of education: 18   Highest education level: Master's degree (e.g., MA, MS, MEng, MEd, MSW, MBA)  Occupational History   Occupation: retired  Tobacco Use   Smoking status: Never    Passive exposure: Past   Smokeless tobacco: Never  Vaping Use   Vaping status: Never Used  Substance and Sexual Activity   Alcohol use: Not Currently    Comment: once in a while/rarely   Drug use: Never   Sexual activity: Not Currently  Other Topics Concern   Not on file  Social History Narrative   Right handed  No caffeine   One level   Retired Teacher, 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   Social Drivers of Corporate investment banker Strain: Low Risk  (06/28/2023)   Overall Financial Resource Strain (CARDIA)    Difficulty of Paying Living Expenses: Not hard at all  Food Insecurity: No Food Insecurity (06/28/2023)   Hunger Vital Sign    Worried About Running Out of Food in the Last Year: Never true    Ran Out of Food in the Last Year: Never true  Transportation Needs: No Transportation Needs (06/28/2023)   PRAPARE - Administrator, Civil Service (Medical): No    Lack of Transportation (Non-Medical): No  Physical Activity: Sufficiently Active (06/28/2023)   Exercise Vital Sign    Days of Exercise per Week: 4 days    Minutes of Exercise per Session: 40 min  Stress: No Stress Concern Present (06/28/2023)   [employer] of Occupational Health - Occupational Stress Questionnaire    Feeling of Stress : Not at all  Social Connections: Moderately Isolated (06/28/2023)   Social Connection and Isolation Panel [NHANES]    Frequency of Communication with Friends and Family: Twice a week    Frequency of Social Gatherings with Friends and Family: Never    Attends Religious Services: More than 4 times per year    Active Member of [employer] or Organizations: No    Attends  Banker Meetings: Never    Marital Status: Married  Catering manager Violence: Not At Risk (03/03/2023)   Humiliation, Afraid, Rape, and Kick questionnaire    Fear of Current or Ex-Partner: No    Emotionally Abused: No    Physically Abused: No    Sexually Abused: No    Review of Systems  Respiratory:  Positive for cough and shortness of breath.     Vitals:   07/24/23 1348  BP: 132/84  Pulse: 78  SpO2: 97%     Physical Exam Constitutional:      Appearance: Normal appearance.  HENT:     Head: Normocephalic.     Mouth/Throat:     Mouth: Mucous membranes are moist.  Eyes:     General: No scleral icterus. Cardiovascular:     Rate and Rhythm: Normal rate and regular rhythm.     Heart sounds: No murmur heard.    No friction rub.  Pulmonary:     Effort: No respiratory distress.     Breath sounds: No stridor. No wheezing or rhonchi.  Musculoskeletal:     Cervical back: No rigidity or tenderness.  Neurological:     Mental Status: She is alert.  Psychiatric:        Mood and Affect: Mood normal.      Data Reviewed: Most recent CT scan of the chest 2025 reviewed with the patient, stable compared to 2023 and as far back as 2019 Pulmonary function test shows severe obstructive disease mild progression from previous restriction, diffusing capacity stayed stable  Did review the CT scans and PFT with the patient during the visit  Assessment:  Emphysema/COPD - Continue Breztri  - Rescue inhaler use as needed  Sarcoidosis with fibrosis, bronchiectasis - This has stayed stable  Coronary artery disease, status post CABG - Participating in physical therapy - Improvement in activity levels   Plan/Recommendations:  Prescription for albuterol  refilled  Continue Breztri   Follow-up in 3 months  Encouraged to make sure she continues to stay active  Encouraged to give us  a call with any significant concerns  Myer Artis MD [facility] 07/24/2023, 1:52 PM  CC: Nche, Connye Delaine, NP

## 2023-07-24 NOTE — Progress Notes (Signed)
 Full PFT performed today.

## 2023-07-28 ENCOUNTER — Encounter (HOSPITAL_BASED_OUTPATIENT_CLINIC_OR_DEPARTMENT_OTHER): Payer: Self-pay | Admitting: Physical Therapy

## 2023-07-28 ENCOUNTER — Ambulatory Visit (HOSPITAL_BASED_OUTPATIENT_CLINIC_OR_DEPARTMENT_OTHER): Payer: Self-pay | Admitting: Physical Therapy

## 2023-07-28 DIAGNOSIS — M5459 Other low back pain: Secondary | ICD-10-CM

## 2023-07-28 DIAGNOSIS — R262 Difficulty in walking, not elsewhere classified: Secondary | ICD-10-CM

## 2023-07-28 DIAGNOSIS — M6281 Muscle weakness (generalized): Secondary | ICD-10-CM | POA: Diagnosis not present

## 2023-07-28 DIAGNOSIS — R293 Abnormal posture: Secondary | ICD-10-CM

## 2023-07-28 DIAGNOSIS — M546 Pain in thoracic spine: Secondary | ICD-10-CM

## 2023-07-28 NOTE — Therapy (Signed)
 OUTPATIENT PHYSICAL THERAPY TREATMENT         Patient Name: Kristen Suarez MRN: [id number] DOB:[birth year], 78 y.o., female Today's Date: 07/29/2023  END OF SESSION:  PT End of Session - 07/28/23 1116     Visit Number 15    Number of Visits 20    Date for PT Re-Evaluation 08/07/23    Authorization Type MCR A and B    PT Start Time 1106    PT Stop Time 1145    PT Time Calculation (min) 39 min    Activity Tolerance Patient tolerated treatment well    Behavior During Therapy [facility] for tasks assessed/performed                        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/1976   Weight loss, unintentional 12/31/2022   Past Surgical History:  Procedure Laterality Date   ABDOMINAL HYSTERECTOMY  1987   BASAL CELL CARCINOMA EXCISION  06/30/2020   nose   CARDIAC CATHETERIZATION     CLIPPING OF ATRIAL APPENDAGE N/A 01/04/2022   Procedure: CLIPPING OF ATRIAL APPENDAGE USING SIZE 40 ATRICURE ATRIAL CLIP;  Surgeon: Eleanora Grew, MD;  Location: [facility] OR;  Service: Open Heart Surgery;  Laterality: N/A;   CORONARY ARTERY BYPASS GRAFT N/A 01/04/2022   Procedure: CORONARY ARTERY BYPASS GRAFTING (CABG) x  USING ENDOSCOPIC GREATER SAPHENOUS VEIN HARVEST;  Surgeon: Eleanora Grew, MD;  Location: [facility] OR;  Service: Open Heart Surgery;  Laterality: N/A;   LEFT HEART CATH AND CORONARY ANGIOGRAPHY N/A 01/02/2022   Procedure: LEFT HEART CATH AND CORONARY ANGIOGRAPHY;  Surgeon: Millicent Ally, MD;  [facility];  Service: Cardiovascular;  Laterality: N/A;   LUNG BIOPSY   06/16/1976   PERIPHERAL VASCULAR INTERVENTION Right 11/29/2020   Procedure: PERIPHERAL VASCULAR INTERVENTION;  Surgeon: Wenona Hamilton, MD;  [facility]: [facility];  Service: Cardiovascular;  Laterality: Right;  Renal Artery   RENAL ANGIOGRAPHY N/A 11/29/2020   Procedure: RENAL ANGIOGRAPHY;  Surgeon: Wenona Hamilton, MD;  [facility];  Service: Cardiovascular;  Laterality: N/A;   TEE WITHOUT CARDIOVERSION N/A 01/04/2022   Procedure: TRANSESOPHAGEAL ECHOCARDIOGRAM (TEE);  Surgeon: Eleanora Grew, MD;  Location: [facility] OR;  Service: Open Heart Surgery;  Laterality: N/A;   Patient Active Problem List   Diagnosis Date Noted   PAF (paroxysmal atrial fibrillation) (HCC) 07/02/2023   Memory impairment 03/21/2023   Adjustment insomnia 08/19/2022   Nocturia more than twice per night 08/19/2022   Bilateral sensorineural hearing loss 06/27/2022   Hearing loss of right ear 02/04/2022   Hx of CABG 01/04/2022   Elevated coronary artery calcium  score    Chronic kidney disease, stage 3b (HCC) 01/01/2022   Syncope 12/31/2021   Renal artery stenosis (HCC) 09/25/2021   Aortic valve sclerosis 09/25/2021   Snoring 04/13/2021   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   Epistaxis 03/29/2021   Coronary artery disease due to calcified  coronary lesion 08/15/2020   Basal cell carcinoma 06/30/2020   History of loop recorder 12/13/2019   Allergic rhinitis 12/08/2019   Chronic cough 11/08/2019   Post-nasal drip 11/08/2019   Multiple joint pain 04/22/2019   Bilateral leg edema 04/22/2019   Vaginal atrophy 01/05/2019   Resting tremor 09/08/2018   Sarcoidosis 05/08/2018   HTN (hypertension) 05/08/2018   Colon polyp 05/08/2018   Family hx of colon cancer 05/08/2018   Cystocele, midline 05/08/2018   Hyperlipidemia 05/08/2018   Vasovagal syncope 05/08/2018   Seborrheic keratosis 05/08/2018   Pseudophakia of both eyes 05/08/2018   GERD (gastroesophageal reflux disease) 05/08/2018   COPD  (chronic obstructive pulmonary disease) (HCC) 05/08/2018   Osteoporosis 12/31/2016     REFERRING PROVIDER:  Clearnce Curia, NP    Syliva Even, MD      REFERRING DIAG:  [phone] (ICD-10-CM) - Chronic bilateral low back pain without sciatica  M54.6,G89.29 (ICD-10-CM) - Chronic left-sided thoracic back pain   R06.09 (ICD-10-CM) - Exertional dyspnea              R53.81 (ICD-10-CM) - Physical deconditioning  THERAPY DIAG:  Muscle weakness (generalized)  Pain in thoracic spine  Other low back pain  Difficulty in walking, not elsewhere classified  Abnormal posture  Rationale for Evaluation and Treatment: Rehabilitation  ONSET DATE: PT order 04/24/2023  SUBJECTIVE:   SUBJECTIVE STATEMENT:  Pt's cardiologist and pulmonology said she was ok try to aquatic therapy.  Pt denies any adverse effects after prior Rx.  Pt gardened for 45 mins and then took a 20 min break.  Pt tried to garden for 5 more minutes and she had to stop.  She was very tired and hurting all over.  Pt denies any adverse effects after prior Rx.  Pt is using her pulleys at home.    PERTINENT HISTORY: Chronic thoracic pain and lumbar pain CAD s/p CABG x 4 on 01/04/2022 Osteoporosis syncope, pulmonary sarcoidosis, implanted loop recorder Arthritis and HTN Memory problems since CABG surgery--she is being followed by neurology.    PAIN:  4/10 L scapular pain current, 10/10 worst, 1/10 best Pt doesn't know what increases her pain.              0/10 R scapular pain, 5-6/10 worst 4/10 L sided lumbar currently ; 9-10/10 worst  Easing factors:  heating pad, tylenol  Aggravating factors:  Pt not sure what increases her sx's.   PRECAUTIONS: Other: Implanted loop recorder, osteoporosis, balance, CABG, syncope    WEIGHT BEARING RESTRICTIONS: No  FALLS:  Has patient fallen in last 6 months? Yes. Number of falls 1 on 02/23/2023, syncope  LIVING ENVIRONMENT: Lives with: lives with their spouse Lives in:   1 story home Stairs: 2-4 steps to enter/exit home with rail Has following equipment at home: Single point cane, Walker - 4 wheeled, and walking sticks  OCCUPATION: Pt is retired  PLOF: Independent  PATIENT GOALS: to walk a straight line, to have improved activity tolerance after a short rest break, be more flexible    OBJECTIVE:  Note: Objective measures were completed at Evaluation unless otherwise noted.  DIAGNOSTIC FINDINGS:  Lumbar X rays on 3/4: FINDINGS: Five lumbar type vertebral bodies are well visualized. Vertebral body height is well maintained. Disc space narrowing is noted at L5-S1. No anterolisthesis is seen. No soft tissue abnormality is noted. Right renal artery stent is seen.   IMPRESSION: Degenerative change without acute abnormality.   CT chest on 3/5: IMPRESSION: *Stable extensive fibro  atelectatic fibrocalcific changes of both lungs with significant bilateral bronchiectasis and scar changes involving particularly the upper and mid lung distributions with significant central pulmonary retraction and traction bronchiectasis and architectural distortion. These findings correlate with chronic interstitial lung disease and correlate with sarcoidosis stage IV. *No new significant findings. No acute infiltrates or consolidations. *Stable multiple calcified mediastinal and hilar lymph nodes.Per MD note, Lumbar X-rays showed DDD worse L5-S1.  No acute fractures are visible.   TREATMENT DATE:  5/12 POSTURE:  Significant kyphosis and FHP   VITALS:   At beginning of Rx:      O2:  99%         HR:  81-82  At end of Rx:  O2:  96%  HR:  88         Pt seen for aquatic therapy today.  Treatment took place in water 3.5-4.0 ft in depth at the [facility] pool. Temp of water was 91.  Pt entered/exited the pool via stairs independently with bilat rail.  * Ambulated fwd x 3 laps with noodle at depth b/w 3 ft 6 inch and 4 ft depth *Sidestepped x 3  laps with noodle at depth b/w 3 ft 6 inch and 4 ft depth * standing heel raises while holding on to pool wall 2x10 *standing marching while holding onto pool wall 2x10 *standing hip abd while holding onto pool wall 2x5 * Ambulated fwd x laps with noodle x 2 laps with noodle at depth b/w 3 ft 6 inch and 4 ft depth *Sidestepped x 2 laps with noodle at depth b/w 3 ft 6 inch and 4 ft depth *standing with FT without UE support 2x30 sec *sit to stands from pool bench 2x10 * LAQ x 10                                                                                      PATIENT EDUCATION:  Education details:  PT answered pt's questions.  posture, exercise form, rationale for interventions, vitals, relevant anatomy, HEP, what to expect next Rx, and POC.  Person educated: Patient Education method: Explanation, Demonstration, Tactile cues, Verbal cues Education comprehension: verbalized understanding, returned demonstration, verbal cues required, tactile cues required, and needs further education      HOME EXERCISE PROGRAM: Access Code: [email or web address] URL: [email or web address] Date: 05/29/2023 Prepared by: Mayme Spearman  Exercises - Standing Tandem Balance with Counter Support  - 2-3 x daily - 1-2 sets - 1-2 reps - 30sec hold - Side Stepping with Counter Support  - 2-3 x daily - 1 sets - 8 reps - Backward Walking with Counter Support  - 2-3 x daily - 1 sets - 8 reps   - Seated Scapular Retraction  - 2 x daily - 7 x weekly - 1-2 sets - 10 reps - Standing Shoulder Row with Anchored Resistance  - 1 x daily - 3-4 x weekly - 2 sets - 10 reps  ASSESSMENT:  CLINICAL IMPRESSION: Pt presents to PT for aquatic therapy today.  Pt tolerated aquatic exercises very well.  PT had pt take appropriate rest breaks t/o Rx.  She reports no SOB while in the pool performing  exercises and also no SOB after treatment.  PT instructed pt in correct form and positioning with aquatic exercises and she performed  the exercises well.  PT educated pt in the properties and benefits of the water.  Pt responded well to Rx stating her pain is less after the treatment.      OBJECTIVE IMPAIRMENTS: decreased activity tolerance, decreased balance, decreased endurance, decreased mobility, difficulty walking, decreased strength, and pain.   ACTIVITY LIMITATIONS: standing, squatting, transfers, and locomotion level  PARTICIPATION LIMITATIONS: cleaning, shopping, and community activity  PERSONAL FACTORS: 3+ comorbidities: osteoporosis, CABG, arthritis, syncope, pulmonary sarcoidosis are also affecting patient's functional outcome.   REHAB POTENTIAL: Good  CLINICAL DECISION MAKING: Stable/uncomplicated  EVALUATION COMPLEXITY: Low   GOALS:  SHORT TERM GOALS: Target date: 06/11/2023  Pt will be independent and compliant with HEP for improved strength, tolerance to activity, functional endurance, pain, and mobility.   Baseline: 06/12/23: HEP ongoing Goal status: ONGOING  2.  Pt will report at least a 25% improvement in tolerance to activity and her functional mobility.  Baseline:  06/12/23: reports 35-40% improvement, particularly with housework Goal status: MET  3.  Pt will perform 5x STS test in no > than 19 sec for improved functional LE strength and performance of transfers.   Baseline:  06/12/23: 13.95sec no UE support Goal status: MET  4.  Pt will ambulate entire 6 MWT test.   Baseline:  Goal status: ONGOING /  not assessed  5.  Pt will report at least a 25% improvement in thoracic and lumbar pain and sx's overall.  Goal status:  INITIAL  Target date:  07/16/2023    LONG TERM GOALS: Target date: 08/07/2023  Pt will report at least a 70% improvement in her tolerance with daily activities and functional endurance with mobility.    Baseline:    Goal status: 70% MET  2.  Pt will increase ambulation distance by at least 150 ft on 3 MWT.  Baseline:  Goal status: 20% MET  3.  Pt will report  increased ambulation distance with reduced fatigue and SOB. Baseline:  Goal status: PROGRESSING  4.  Pt will perform 5x STS test in < than 16 sec for improved functional LE strength and performance of transfers.  Baseline:  Goal status: GOAL MET  3/27  5.  Pt will demo improved bilat LE strength to 4+/5 in hip flexion and 5/5 in knee extension and at least 6-8# increase in abd for improved performance of functional mobility and performance of household chores. Baseline:  Goal status: INITIAL  6.  Pt will report at least a 70% improvement in her thoracic and lumbar pain overall.    Goal status:  INITIAL  Target date:  08/06/2023  7.  Pt will demo improved L shoulder flexion/abd AROM by 15/10 deg for improved UE elevation and reaching.   Goal status:  INITIAL  Target date:  08/06/2023  8.  Pt will report she is able to use her L UE more with daily activities by at least 50% without significant pain.   Goal status:  INITIAL  Target date:  08/06/2023     PLAN:  PT FREQUENCY: 2x/week  PT DURATION: 5 Weeks  PLANNED INTERVENTIONS: [id number]- PT Re-evaluation, 97110-Therapeutic exercises, 97530- Therapeutic activity, [id number]- Neuromuscular re-education, 97535- Self Care, [id number]- Manual therapy, [id number]- Gait training, [id number]- Aquatic Therapy, [phone]- Ultrasound, Patient/Family education, Balance training, Stair training, Taping, Dry Needling, Cryotherapy, and Moist heat  PLAN FOR NEXT SESSION:  review/update HEP PRN.  Cont with gait, ambulation, balance, functional endurance, and  LE strengthening.  Monitoring activity tolerance via borg dyspnea scale and/or vitals. NO e-stim STM to medial scap mm and thoracic and lumbar paraspinals.  Rows with theraband.  Shoulder pulleys Aquatic therapy next visit.  Assess response/tolerance.   Trina Fujita III PT, DPT 07/29/23 3:30 PM

## 2023-07-30 ENCOUNTER — Ambulatory Visit (HOSPITAL_BASED_OUTPATIENT_CLINIC_OR_DEPARTMENT_OTHER): Admitting: Physical Therapy

## 2023-07-30 ENCOUNTER — Encounter (HOSPITAL_BASED_OUTPATIENT_CLINIC_OR_DEPARTMENT_OTHER): Payer: Self-pay | Admitting: Physical Therapy

## 2023-07-30 DIAGNOSIS — R293 Abnormal posture: Secondary | ICD-10-CM

## 2023-07-30 DIAGNOSIS — M6281 Muscle weakness (generalized): Secondary | ICD-10-CM | POA: Diagnosis not present

## 2023-07-30 DIAGNOSIS — M5459 Other low back pain: Secondary | ICD-10-CM

## 2023-07-30 DIAGNOSIS — M546 Pain in thoracic spine: Secondary | ICD-10-CM

## 2023-07-30 DIAGNOSIS — R262 Difficulty in walking, not elsewhere classified: Secondary | ICD-10-CM

## 2023-07-30 NOTE — Therapy (Signed)
 OUTPATIENT PHYSICAL THERAPY TREATMENT         Patient Name: Kristen Suarez MRN: [id number] DOB:May 24, 1945, 78 y.o., female Today's Date: 07/31/2023  END OF SESSION:  PT End of Session - 07/30/23 0937     Visit Number 16    Number of Visits 20    Date for PT Re-Evaluation 08/07/23    Authorization Type MCR A and B    PT Start Time 0936    PT Stop Time 1015    PT Time Calculation (min) 39 min    Activity Tolerance Patient tolerated treatment well    Behavior During Therapy [facility] for tasks assessed/performed                        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/1976   Weight loss, unintentional 12/31/2022   Past Surgical History:  Procedure Laterality Date   ABDOMINAL HYSTERECTOMY  1987   BASAL CELL CARCINOMA EXCISION  06/30/2020   nose   CARDIAC CATHETERIZATION     CLIPPING OF ATRIAL APPENDAGE N/A 01/04/2022   Procedure: CLIPPING OF ATRIAL APPENDAGE USING SIZE 40 ATRICURE ATRIAL CLIP;  Surgeon: Eleanora Grew, MD;  Location: [facility] OR;  Service: Open Heart Surgery;  Laterality: N/A;   CORONARY ARTERY BYPASS GRAFT N/A 01/04/2022   Procedure: CORONARY ARTERY BYPASS GRAFTING (CABG) x  USING ENDOSCOPIC GREATER SAPHENOUS VEIN HARVEST;  Surgeon: Eleanora Grew, MD;  Location: [facility] OR;  Service: Open Heart Surgery;  Laterality: N/A;   LEFT HEART CATH AND CORONARY ANGIOGRAPHY N/A 01/02/2022   Procedure: LEFT HEART CATH AND CORONARY ANGIOGRAPHY;  Surgeon: Millicent Ally, MD;  Location: [facility] INVASIVE CV LAB;  Service: Cardiovascular;  Laterality: N/A;   LUNG BIOPSY   06/16/1976   PERIPHERAL VASCULAR INTERVENTION Right 11/29/2020   Procedure: PERIPHERAL VASCULAR INTERVENTION;  Surgeon: Wenona Hamilton, MD;  Location: [facility] INVASIVE CV LAB;  Service: Cardiovascular;  Laterality: Right;  Renal Artery   RENAL ANGIOGRAPHY N/A 11/29/2020   Procedure: RENAL ANGIOGRAPHY;  Surgeon: Wenona Hamilton, MD;  Location: [facility] INVASIVE CV LAB;  Service: Cardiovascular;  Laterality: N/A;   TEE WITHOUT CARDIOVERSION N/A 01/04/2022   Procedure: TRANSESOPHAGEAL ECHOCARDIOGRAM (TEE);  Surgeon: Eleanora Grew, MD;  Location: [facility] OR;  Service: Open Heart Surgery;  Laterality: N/A;   Patient Active Problem List   Diagnosis Date Noted   PAF (paroxysmal atrial fibrillation) (HCC) 07/02/2023   Memory impairment 03/21/2023   Adjustment insomnia 08/19/2022   Nocturia more than twice per night 08/19/2022   Bilateral sensorineural hearing loss 06/27/2022   Hearing loss of right ear 02/04/2022   Hx of CABG 01/04/2022   Elevated coronary artery calcium  score    Chronic kidney disease, stage 3b (HCC) 01/01/2022   Syncope 12/31/2021   Renal artery stenosis (HCC) 09/25/2021   Aortic valve sclerosis 09/25/2021   Snoring 04/13/2021   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   Epistaxis 03/29/2021   Coronary artery disease due to calcified  coronary lesion 08/15/2020   Basal cell carcinoma 06/30/2020   History of loop recorder 12/13/2019   Allergic rhinitis 12/08/2019   Chronic cough 11/08/2019   Post-nasal drip 11/08/2019   Multiple joint pain 04/22/2019   Bilateral leg edema 04/22/2019   Vaginal atrophy 01/05/2019   Resting tremor 09/08/2018   Sarcoidosis 05/08/2018   HTN (hypertension) 05/08/2018   Colon polyp 05/08/2018   Family hx of colon cancer 05/08/2018   Cystocele, midline 05/08/2018   Hyperlipidemia 05/08/2018   Vasovagal syncope 05/08/2018   Seborrheic keratosis 05/08/2018   Pseudophakia of both eyes 05/08/2018   GERD (gastroesophageal reflux disease) 05/08/2018   COPD  (chronic obstructive pulmonary disease) (HCC) 05/08/2018   Osteoporosis 12/31/2016     REFERRING PROVIDER:  Clearnce Curia, NP    Syliva Even, MD      REFERRING DIAG:  [phone] (ICD-10-CM) - Chronic bilateral low back pain without sciatica  M54.6,G89.29 (ICD-10-CM) - Chronic left-sided thoracic back pain   R06.09 (ICD-10-CM) - Exertional dyspnea              R53.81 (ICD-10-CM) - Physical deconditioning  THERAPY DIAG:  Muscle weakness (generalized)  Pain in thoracic spine  Other low back pain  Difficulty in walking, not elsewhere classified  Abnormal posture  Rationale for Evaluation and Treatment: Rehabilitation  ONSET DATE: PT order 04/24/2023  SUBJECTIVE:   SUBJECTIVE STATEMENT:  Pt states she picked up a catch in her R sided lumbar, but doesn't know what she did.  Pt states she was tired after prior aquatic Rx though no increased pain.     PERTINENT HISTORY: Chronic thoracic pain and lumbar pain CAD s/p CABG x 4 on 01/04/2022 Osteoporosis syncope, pulmonary sarcoidosis, implanted loop recorder Arthritis and HTN Memory problems since CABG surgery--she is being followed by neurology.    PAIN:  4-5/10 L scapular pain current, 10/10 worst, 1/10 best Pt doesn't know what increases her pain.              0/10 R scapular pain, 5-6/10 worst 3-4/10 L sided lumbar currently ; 9-10/10 worst  Easing factors:  heating pad, tylenol  Aggravating factors:  Pt not sure what increases her sx's.   PRECAUTIONS: Other: Implanted loop recorder, osteoporosis, balance, CABG, syncope    WEIGHT BEARING RESTRICTIONS: No  FALLS:  Has patient fallen in last 6 months? Yes. Number of falls 1 on 02/23/2023, syncope  LIVING ENVIRONMENT: Lives with: lives with their spouse Lives in:  1 story home Stairs: 2-4 steps to enter/exit home with rail Has following equipment at home: Single point cane, Walker - 4 wheeled, and walking sticks  OCCUPATION: Pt is retired  PLOF:  Independent  PATIENT GOALS: to walk a straight line, to have improved activity tolerance after a short rest break, be more flexible    OBJECTIVE:  Note: Objective measures were completed at Evaluation unless otherwise noted.  DIAGNOSTIC FINDINGS:  Lumbar X rays on 3/4: FINDINGS: Five lumbar type vertebral bodies are well visualized. Vertebral body height is well maintained. Disc space narrowing is noted at L5-S1. No anterolisthesis is seen. No soft tissue abnormality is noted. Right renal artery stent is seen.   IMPRESSION: Degenerative change without acute abnormality.   CT chest on 3/5: IMPRESSION: *Stable extensive fibro atelectatic fibrocalcific changes of both lungs with significant bilateral bronchiectasis and scar changes involving particularly the upper and mid lung distributions with significant central pulmonary retraction and traction bronchiectasis and architectural distortion. These findings correlate with chronic interstitial lung disease and  correlate with sarcoidosis stage IV. *No new significant findings. No acute infiltrates or consolidations. *Stable multiple calcified mediastinal and hilar lymph nodes.Per MD note, Lumbar X-rays showed DDD worse L5-S1.  No acute fractures are visible.   TREATMENT DATE:  5/12 POSTURE:  Significant kyphosis and FHP   VITALS:   Pt states her O2 was 99% when she checked it at home before she came here.        Pt seen for aquatic therapy today.  Treatment took place in water 3.5-4.0 ft in depth at the [facility] pool. Temp of water was 91.  Pt entered/exited the pool via stairs independently with bilat rail.  * Ambulated fwd x 3 laps with noodle at depth b/w 3 ft 6 inch depth *Sidestepped x 3 laps with noodle at depth b/w 3 ft 6 inch depth *sit to stands from pool bench 2x10 * LAQ 2x 10 * standing heel raises while holding on to pool wall 2x10 *standing marching while holding onto noodle x10, approx  15 *standing hip abd while holding onto pool wall x10 *standing hip flexion while holding onto pool wall x10 * Ambulated fwd x laps with noodle x 2 laps with noodle at depth b/w 3 ft 6 inch and 4 ft depth *Sidestepped x 2 laps with noodle at depth b/w 3 ft 6 inch and 4 ft depth *standing with FT without UE support 2x30 sec                                                                                        PATIENT EDUCATION:  Education details:  PT answered pt's questions.  Aquatic properties and benefits, posture, exercise form, rationale for interventions, relevant anatomy, what to expect next Rx, and POC.  Person educated: Patient Education method: Explanation, Demonstration, Tactile cues, Verbal cues Education comprehension: verbalized understanding, returned demonstration, verbal cues required, tactile cues required, and needs further education      HOME EXERCISE PROGRAM: Access Code: [email or web address] URL: [email or web address] Date: 05/29/2023 Prepared by: Mayme Spearman  Exercises - Standing Tandem Balance with Counter Support  - 2-3 x daily - 1-2 sets - 1-2 reps - 30sec hold - Side Stepping with Counter Support  - 2-3 x daily - 1 sets - 8 reps - Backward Walking with Counter Support  - 2-3 x daily - 1 sets - 8 reps   - Seated Scapular Retraction  - 2 x daily - 7 x weekly - 1-2 sets - 10 reps - Standing Shoulder Row with Anchored Resistance  - 1 x daily - 3-4 x weekly - 2 sets - 10 reps  ASSESSMENT:  CLINICAL IMPRESSION: Pt presents to PT for aquatic therapy today.  PT progressed aquatic exercises today and Pt had good tolerance.  She had no SOB while in the pool performing exercises and also no SOB after treatment.  PT had pt take appropriate rest breaks t/o Rx.   Pt performed aquatic exercises well with cuing and instruction in correct form and positioning.  PT educated pt in the properties and benefits of the water.  Pt responded well to Rx reporting improved pain  to 2-3/10 in medial scap  and no pain in lumbar.     OBJECTIVE IMPAIRMENTS: decreased activity tolerance, decreased balance, decreased endurance, decreased mobility, difficulty walking, decreased strength, and pain.   ACTIVITY LIMITATIONS: standing, squatting, transfers, and locomotion level  PARTICIPATION LIMITATIONS: cleaning, shopping, and community activity  PERSONAL FACTORS: 3+ comorbidities: osteoporosis, CABG, arthritis, syncope, pulmonary sarcoidosis are also affecting patient's functional outcome.   REHAB POTENTIAL: Good  CLINICAL DECISION MAKING: Stable/uncomplicated  EVALUATION COMPLEXITY: Low   GOALS:  SHORT TERM GOALS: Target date: 06/11/2023  Pt will be independent and compliant with HEP for improved strength, tolerance to activity, functional endurance, pain, and mobility.   Baseline: 06/12/23: HEP ongoing Goal status: ONGOING  2.  Pt will report at least a 25% improvement in tolerance to activity and her functional mobility.  Baseline:  06/12/23: reports 35-40% improvement, particularly with housework Goal status: MET  3.  Pt will perform 5x STS test in no > than 19 sec for improved functional LE strength and performance of transfers.   Baseline:  06/12/23: 13.95sec no UE support Goal status: MET  4.  Pt will ambulate entire 6 MWT test.   Baseline:  Goal status: ONGOING /  not assessed  5.  Pt will report at least a 25% improvement in thoracic and lumbar pain and sx's overall.  Goal status:  INITIAL  Target date:  07/16/2023    LONG TERM GOALS: Target date: 08/07/2023  Pt will report at least a 70% improvement in her tolerance with daily activities and functional endurance with mobility.    Baseline:    Goal status: 70% MET  2.  Pt will increase ambulation distance by at least 150 ft on 3 MWT.  Baseline:  Goal status: 20% MET  3.  Pt will report increased ambulation distance with reduced fatigue and SOB. Baseline:  Goal status: PROGRESSING  4.   Pt will perform 5x STS test in < than 16 sec for improved functional LE strength and performance of transfers.  Baseline:  Goal status: GOAL MET  3/27  5.  Pt will demo improved bilat LE strength to 4+/5 in hip flexion and 5/5 in knee extension and at least 6-8# increase in abd for improved performance of functional mobility and performance of household chores. Baseline:  Goal status: INITIAL  6.  Pt will report at least a 70% improvement in her thoracic and lumbar pain overall.    Goal status:  INITIAL  Target date:  08/06/2023  7.  Pt will demo improved L shoulder flexion/abd AROM by 15/10 deg for improved UE elevation and reaching.   Goal status:  INITIAL  Target date:  08/06/2023  8.  Pt will report she is able to use her L UE more with daily activities by at least 50% without significant pain.   Goal status:  INITIAL  Target date:  08/06/2023     PLAN:  PT FREQUENCY: 2x/week  PT DURATION: 5 Weeks  PLANNED INTERVENTIONS: [id number]- PT Re-evaluation, 97110-Therapeutic exercises, 97530- Therapeutic activity, [id number]- Neuromuscular re-education, 97535- Self Care, 86578- Manual therapy, [id number]- Gait training, [phone]- Aquatic Therapy, [phone]- Ultrasound, Patient/Family education, Balance training, Stair training, Taping, Dry Needling, Cryotherapy, and Moist heat  PLAN FOR NEXT SESSION:  review/update HEP PRN.  Cont with gait, ambulation, balance, functional endurance, and  LE strengthening.  Monitoring activity tolerance via borg dyspnea scale and/or vitals. NO e-stim STM to medial scap mm and thoracic and lumbar paraspinals.  Rows with theraband.  Shoulder pulleys Cont with Aquatic therapy.  Assess response/tolerance.  May try dry needling next visit.   Trina Fujita III PT, DPT 07/31/23 4:44 PM

## 2023-07-31 ENCOUNTER — Ambulatory Visit (INDEPENDENT_AMBULATORY_CARE_PROVIDER_SITE_OTHER): Admitting: Psychology

## 2023-07-31 DIAGNOSIS — R4189 Other symptoms and signs involving cognitive functions and awareness: Secondary | ICD-10-CM

## 2023-07-31 NOTE — Progress Notes (Signed)
   NEUROPSYCHOLOGY FEEDBACK SESSION [facility]  [place] Department of Neurology  Date of Feedback Session: 07/31/2023  REASON FOR REFERRAL   Kristen Suarez is a 78 year old, right-handed, White female with 18 years of formal education. She was referred for neuropsychological evaluation by Tex Filbert, PA-C, to assess current neurocognitive functioning, document potential cognitive deficits, and assist with treatment planning. This is her first neuropsychological evaluation.   FEEDBACK   Patient completed a comprehensive neuropsychological evaluation on 07/23/2023. Please refer to that encounter for the full report and recommendations. Briefly, results were generally within age-related expectations, with the exception of isolated weaknesses on tasks of verbal fluency and fine motor dexterity in her nondominant hand. She does not show evidence of a neurocognitive disorder at this time. Etiology of verbal fluency difficulties is unclear. However, in the absence of neuroimaging findings and considering her other normal language scores, this is not overly concerning at present, though monitoring over time is recommended. Subjective cognitive concerns are most likely related to recent stress/anxiety, hearing loss, and mild cerebrovascular changes. It is encouraging that she has demonstrated significant improvement since the initial decline following surgery and that her husband does not report any major concerns at this time.   Today, the patient was accompanied by her husband. They were provided verbal feedback regarding the findings and impression during this visit, and their questions were answered. A copy of the report was provided at the conclusion of the visit.  DISPOSITION   Patient will follow up with the referring provider, [name]. No follow-up neuropsychological testing was scheduled at this time. Please feel free to refer the patient for repeated evaluation if she  shows a significant change in neurocognitive status.  SERVICE   This feedback session was conducted by Janice Meeter, Psy.D. One unit of [id number] (35 minutes) was billed for Dr. Donavon Fudge' time spent in preparing, conducting, and documenting the current feedback session.  This report was generated using voice recognition software. While this document has been carefully reviewed, transcription errors may be present. I apologize in advance for any inconvenience. Please contact me if further clarification is needed.

## 2023-08-03 LAB — PULMONARY FUNCTION TEST
DL/VA % pred: 110 %
DL/VA: 4.55 ml/min/mmHg/L
DLCO cor % pred: 54 %
DLCO cor: 9.93 ml/min/mmHg
DLCO unc % pred: 54 %
DLCO unc: 9.93 ml/min/mmHg
FEF 25-75 Post: 0.47 L/s
FEF 25-75 Pre: 0.34 L/s
FEF2575-%Change-Post: 39 %
FEF2575-%Pred-Post: 31 %
FEF2575-%Pred-Pre: 22 %
FEV1-%Change-Post: 5 %
FEV1-%Pred-Post: 43 %
FEV1-%Pred-Pre: 41 %
FEV1-Post: 0.85 L
FEV1-Pre: 0.8 L
FEV1FVC-%Change-Post: 2 %
FEV1FVC-%Pred-Pre: 81 %
FEV6-%Change-Post: 3 %
FEV6-%Pred-Post: 55 %
FEV6-%Pred-Pre: 53 %
FEV6-Post: 1.36 L
FEV6-Pre: 1.31 L
FEV6FVC-%Change-Post: 0 %
FEV6FVC-%Pred-Post: 105 %
FEV6FVC-%Pred-Pre: 105 %
FVC-%Change-Post: 3 %
FVC-%Pred-Post: 52 %
FVC-%Pred-Pre: 50 %
FVC-Post: 1.36 L
FVC-Pre: 1.32 L
Post FEV1/FVC ratio: 62 %
Post FEV6/FVC ratio: 100 %
Pre FEV1/FVC ratio: 61 %
Pre FEV6/FVC Ratio: 100 %
RV % pred: 79 %
RV: 1.81 L
TLC % pred: 63 %
TLC: 3.13 L

## 2023-08-04 ENCOUNTER — Encounter (HOSPITAL_BASED_OUTPATIENT_CLINIC_OR_DEPARTMENT_OTHER): Payer: Self-pay | Admitting: Physical Therapy

## 2023-08-04 ENCOUNTER — Ambulatory Visit (HOSPITAL_COMMUNITY)
Admission: RE | Admit: 2023-08-04 | Discharge: 2023-08-04 | Disposition: A | Discharge status: Home / Self Care | Source: Ambulatory Visit | Attending: Cardiovascular Disease | Admitting: Cardiovascular Disease

## 2023-08-04 ENCOUNTER — Ambulatory Visit (HOSPITAL_BASED_OUTPATIENT_CLINIC_OR_DEPARTMENT_OTHER): Admitting: Physical Therapy

## 2023-08-04 DIAGNOSIS — I701 Atherosclerosis of renal artery: Secondary | ICD-10-CM | POA: Diagnosis present

## 2023-08-04 DIAGNOSIS — M6281 Muscle weakness (generalized): Secondary | ICD-10-CM | POA: Diagnosis not present

## 2023-08-04 DIAGNOSIS — R262 Difficulty in walking, not elsewhere classified: Secondary | ICD-10-CM

## 2023-08-04 NOTE — Therapy (Signed)
 OUTPATIENT PHYSICAL THERAPY TREATMENT         Patient Name: Kristen Suarez MRN: [id number] DOB:01-30-1946, 78 y.o., female Today's Date: 08/05/2023  END OF SESSION:  PT End of Session - 08/05/23 0811     Visit Number 17    Number of Visits 20    Date for PT Re-Evaluation 08/07/23    Authorization Type MCR A and B    PT Start Time 1230    PT Stop Time 1314    PT Time Calculation (min) 44 min    Activity Tolerance Patient tolerated treatment well    Behavior During Therapy [facility] for tasks assessed/performed                         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/1976   Weight loss, unintentional 12/31/2022   Past Surgical History:  Procedure Laterality Date   ABDOMINAL HYSTERECTOMY  1987   BASAL CELL CARCINOMA EXCISION  06/30/2020   nose   CARDIAC CATHETERIZATION     CLIPPING OF ATRIAL APPENDAGE N/A 01/04/2022   Procedure: CLIPPING OF ATRIAL APPENDAGE USING SIZE 40 ATRICURE ATRIAL CLIP;  Surgeon: Eleanora Grew, MD;  Location: [facility] OR;  Service: Open Heart Surgery;  Laterality: N/A;   CORONARY ARTERY BYPASS GRAFT N/A 01/04/2022   Procedure: CORONARY ARTERY BYPASS GRAFTING (CABG) x  USING ENDOSCOPIC GREATER SAPHENOUS VEIN HARVEST;  Surgeon: Eleanora Grew, MD;  Location: [facility] OR;  Service: Open Heart Surgery;  Laterality: N/A;   LEFT HEART CATH AND CORONARY ANGIOGRAPHY N/A 01/02/2022   Procedure: LEFT HEART CATH AND CORONARY ANGIOGRAPHY;  Surgeon: Millicent Ally, MD;  Location: [facility] INVASIVE CV LAB;  Service: Cardiovascular;  Laterality: N/A;   LUNG BIOPSY   06/16/1976   PERIPHERAL VASCULAR INTERVENTION Right 11/29/2020   Procedure: PERIPHERAL VASCULAR INTERVENTION;  Surgeon: Wenona Hamilton, MD;  Location: [facility] INVASIVE CV LAB;  Service: Cardiovascular;  Laterality: Right;  Renal Artery   RENAL ANGIOGRAPHY N/A 11/29/2020   Procedure: RENAL ANGIOGRAPHY;  Surgeon: Wenona Hamilton, MD;  Location: [facility] INVASIVE CV LAB;  Service: Cardiovascular;  Laterality: N/A;   TEE WITHOUT CARDIOVERSION N/A 01/04/2022   Procedure: TRANSESOPHAGEAL ECHOCARDIOGRAM (TEE);  Surgeon: Eleanora Grew, MD;  Location: [facility] OR;  Service: Open Heart Surgery;  Laterality: N/A;   Patient Active Problem List   Diagnosis Date Noted   PAF (paroxysmal atrial fibrillation) (HCC) 07/02/2023   Memory impairment 03/21/2023   Adjustment insomnia 08/19/2022   Nocturia more than twice per night 08/19/2022   Bilateral sensorineural hearing loss 06/27/2022   Hearing loss of right ear 02/04/2022   Hx of CABG 01/04/2022   Elevated coronary artery calcium  score    Chronic kidney disease, stage 3b (HCC) 01/01/2022   Syncope 12/31/2021   Renal artery stenosis (HCC) 09/25/2021   Aortic valve sclerosis 09/25/2021   Snoring 04/13/2021   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   Epistaxis 03/29/2021   Coronary artery disease due to  calcified coronary lesion 08/15/2020   Basal cell carcinoma 06/30/2020   History of loop recorder 12/13/2019   Allergic rhinitis 12/08/2019   Chronic cough 11/08/2019   Post-nasal drip 11/08/2019   Multiple joint pain 04/22/2019   Bilateral leg edema 04/22/2019   Vaginal atrophy 01/05/2019   Resting tremor 09/08/2018   Sarcoidosis 05/08/2018   HTN (hypertension) 05/08/2018   Colon polyp 05/08/2018   Family hx of colon cancer 05/08/2018   Cystocele, midline 05/08/2018   Hyperlipidemia 05/08/2018   Vasovagal syncope 05/08/2018   Seborrheic keratosis 05/08/2018   Pseudophakia of both eyes 05/08/2018   GERD (gastroesophageal reflux disease) 05/08/2018   COPD  (chronic obstructive pulmonary disease) (HCC) 05/08/2018   Osteoporosis 12/31/2016     REFERRING PROVIDER:  Clearnce Curia, NP    Syliva Even, MD      REFERRING DIAG:  [phone] (ICD-10-CM) - Chronic bilateral low back pain without sciatica  M54.6,G89.29 (ICD-10-CM) - Chronic left-sided thoracic back pain   R06.09 (ICD-10-CM) - Exertional dyspnea              R53.81 (ICD-10-CM) - Physical deconditioning  THERAPY DIAG:  No diagnosis found.  Rationale for Evaluation and Treatment: Rehabilitation  ONSET DATE: PT order 04/24/2023  SUBJECTIVE:   SUBJECTIVE STATEMENT:  The patient continues to report left shoulder/thoracic pain. She reports benefit from needling in 2023.     PERTINENT HISTORY: Chronic thoracic pain and lumbar pain CAD s/p CABG x 4 on 01/04/2022 Osteoporosis syncope, pulmonary sarcoidosis, implanted loop recorder Arthritis and HTN Memory problems since CABG surgery--she is being followed by neurology.    PAIN:  4-5/10 L scapular pain current, 10/10 worst, 1/10 best Pt doesn't know what increases her pain.              0/10 R scapular pain, 5-6/10 worst 3-4/10 L sided lumbar currently ; 9-10/10 worst  Easing factors:  heating pad, tylenol  Aggravating factors:  Pt not sure what increases her sx's.   PRECAUTIONS: Other: Implanted loop recorder, osteoporosis, balance, CABG, syncope    WEIGHT BEARING RESTRICTIONS: No  FALLS:  Has patient fallen in last 6 months? Yes. Number of falls 1 on 02/23/2023, syncope  LIVING ENVIRONMENT: Lives with: lives with their spouse Lives in:  1 story home Stairs: 2-4 steps to enter/exit home with rail Has following equipment at home: Single point cane, Walker - 4 wheeled, and walking sticks  OCCUPATION: Pt is retired  PLOF: Independent  PATIENT GOALS: to walk a straight line, to have improved activity tolerance after a short rest break, be more flexible    OBJECTIVE:  Note: Objective measures were  completed at Evaluation unless otherwise noted.  DIAGNOSTIC FINDINGS:  Lumbar X rays on 3/4: FINDINGS: Five lumbar type vertebral bodies are well visualized. Vertebral body height is well maintained. Disc space narrowing is noted at L5-S1. No anterolisthesis is seen. No soft tissue abnormality is noted. Right renal artery stent is seen.   IMPRESSION: Degenerative change without acute abnormality.   CT chest on 3/5: IMPRESSION: *Stable extensive fibro atelectatic fibrocalcific changes of both lungs with significant bilateral bronchiectasis and scar changes involving particularly the upper and mid lung distributions with significant central pulmonary retraction and traction bronchiectasis and architectural distortion. These findings correlate with chronic interstitial lung disease and correlate with sarcoidosis stage IV. *No new significant findings. No acute infiltrates or consolidations. *Stable multiple calcified mediastinal and hilar lymph nodes.Per MD note, Lumbar X-rays showed DDD worse L5-S1.  No acute fractures are visible.  TREATMENT DATE:  5/19 Trigger Point Dry Needling  Initial Treatment: Pt instructed on Dry Needling rational, procedures, and possible side effects. Pt instructed to expect mild to moderate muscle soreness later in the day and/or into the next day.  Pt instructed in methods to reduce muscle soreness. Pt instructed to continue prescribed HEP. Because Dry Needling was performed over or adjacent to a lung field, pt was educated on S/S of pneumothorax and to seek immediate medical attention should they occur.  Patient was educated on signs and symptoms of infection and other risk factors and advised to seek medical attention should they occur.  Patient verbalized understanding of these instructions and education.   Patient Verbal Consent Given: Yes Education Handout Provided: Yes Muscles Treated: left upper trap 2x/ left thoracic paraspinal T-3-T6 3x  using a .30x50 needle for all  Electrical Stimulation Performed: No Treatment Response/Outcome: great twitch with all needles.   Neur-re -ed   Row: red x10 RPE of 3  X12 RPE of 4  X14 RPE of 5   Wall posture 2x10 with cueing   There-ex: forward wal stretch shoulder flexion 3x10   Nu-step 5 min L4   Manual:  Skilled palpation of trigger points  Trigger poitn release to T-spaine and upper trap  Gross throacic PA mobilizations  [place] rib springing for thoracic     5/12 POSTURE:  Significant kyphosis and FHP   VITALS:   Pt states her O2 was 99% when she checked it at home before she came here.        Pt seen for aquatic therapy today.  Treatment took place in water 3.5-4.0 ft in depth at the [facility] pool. Temp of water was 91.  Pt entered/exited the pool via stairs independently with bilat rail.  * Ambulated fwd x 3 laps with noodle at depth b/w 3 ft 6 inch depth *Sidestepped x 3 laps with noodle at depth b/w 3 ft 6 inch depth *sit to stands from pool bench 2x10 * LAQ 2x 10 * standing heel raises while holding on to pool wall 2x10 *standing marching while holding onto noodle x10, approx 15 *standing hip abd while holding onto pool wall x10 *standing hip flexion while holding onto pool wall x10 * Ambulated fwd x laps with noodle x 2 laps with noodle at depth b/w 3 ft 6 inch and 4 ft depth *Sidestepped x 2 laps with noodle at depth b/w 3 ft 6 inch and 4 ft depth *standing with FT without UE support 2x30 sec                                                                                        PATIENT EDUCATION:  Education details:  PT answered pt's questions.  Aquatic properties and benefits, posture, exercise form, rationale for interventions, relevant anatomy, what to expect next Rx, and POC.  Person educated: Patient Education method: Explanation, Demonstration, Tactile cues, Verbal cues Education comprehension: verbalized understanding, returned  demonstration, verbal cues required, tactile cues required, and needs further education      HOME EXERCISE PROGRAM: Access Code: [email or web address] URL: [email or web address] Date: 05/29/2023 Prepared by: Mayme Spearman  Exercises -  Standing Tandem Balance with Counter Support  - 2-3 x daily - 1-2 sets - 1-2 reps - 30sec hold - Side Stepping with Counter Support  - 2-3 x daily - 1 sets - 8 reps - Backward Walking with Counter Support  - 2-3 x daily - 1 sets - 8 reps   - Seated Scapular Retraction  - 2 x daily - 7 x weekly - 1-2 sets - 10 reps - Standing Shoulder Row with Anchored Resistance  - 1 x daily - 3-4 x weekly - 2 sets - 10 reps  ASSESSMENT:  CLINICAL IMPRESSION: The patient had a great twitch with needling. We focused on postural correction exercises for home. She has a large thoracic kyphosis which is likely contributing to her thoracic and shoulder pain. She has been doing a row given to her by pelvic floor PT. We worked on grading the row to make sure she is getting a challenge from it. Therapy updated her HEP. We will re-assess next visit.    OBJECTIVE IMPAIRMENTS: decreased activity tolerance, decreased balance, decreased endurance, decreased mobility, difficulty walking, decreased strength, and pain.   ACTIVITY LIMITATIONS: standing, squatting, transfers, and locomotion level  PARTICIPATION LIMITATIONS: cleaning, shopping, and community activity  PERSONAL FACTORS: 3+ comorbidities: osteoporosis, CABG, arthritis, syncope, pulmonary sarcoidosis are also affecting patient's functional outcome.   REHAB POTENTIAL: Good  CLINICAL DECISION MAKING: Stable/uncomplicated  EVALUATION COMPLEXITY: Low   GOALS:  SHORT TERM GOALS: Target date: 06/11/2023  Pt will be independent and compliant with HEP for improved strength, tolerance to activity, functional endurance, pain, and mobility.   Baseline: 06/12/23: HEP ongoing Goal status: ONGOING  2.  Pt will report at least  a 25% improvement in tolerance to activity and her functional mobility.  Baseline:  06/12/23: reports 35-40% improvement, particularly with housework Goal status: MET  3.  Pt will perform 5x STS test in no > than 19 sec for improved functional LE strength and performance of transfers.   Baseline:  06/12/23: 13.95sec no UE support Goal status: MET  4.  Pt will ambulate entire 6 MWT test.   Baseline:  Goal status: ONGOING /  not assessed  5.  Pt will report at least a 25% improvement in thoracic and lumbar pain and sx's overall.  Goal status:  INITIAL  Target date:  07/16/2023    LONG TERM GOALS: Target date: 08/07/2023  Pt will report at least a 70% improvement in her tolerance with daily activities and functional endurance with mobility.    Baseline:    Goal status: 70% MET  2.  Pt will increase ambulation distance by at least 150 ft on 3 MWT.  Baseline:  Goal status: 20% MET  3.  Pt will report increased ambulation distance with reduced fatigue and SOB. Baseline:  Goal status: PROGRESSING  4.  Pt will perform 5x STS test in < than 16 sec for improved functional LE strength and performance of transfers.  Baseline:  Goal status: GOAL MET  3/27  5.  Pt will demo improved bilat LE strength to 4+/5 in hip flexion and 5/5 in knee extension and at least 6-8# increase in abd for improved performance of functional mobility and performance of household chores. Baseline:  Goal status: INITIAL  6.  Pt will report at least a 70% improvement in her thoracic and lumbar pain overall.    Goal status:  INITIAL  Target date:  08/06/2023  7.  Pt will demo improved L shoulder flexion/abd AROM by 15/10 deg  for improved UE elevation and reaching.   Goal status:  INITIAL  Target date:  08/06/2023  8.  Pt will report she is able to use her L UE more with daily activities by at least 50% without significant pain.   Goal status:  INITIAL  Target date:  08/06/2023     PLAN:  PT FREQUENCY:  2x/week  PT DURATION: 5 Weeks  PLANNED INTERVENTIONS: 97164- PT Re-evaluation, 97110-Therapeutic exercises, 97530- Therapeutic activity, [id number]- Neuromuscular re-education, 97535- Self Care, 16109- Manual therapy, [id number]- Gait training, [phone]- Aquatic Therapy, [phone]- Ultrasound, Patient/Family education, Balance training, Stair training, Taping, Dry Needling, Cryotherapy, and Moist heat  PLAN FOR NEXT SESSION:  review/update HEP PRN.  Cont with gait, ambulation, balance, functional endurance, and  LE strengthening.  Monitoring activity tolerance via borg dyspnea scale and/or vitals. NO e-stim STM to medial scap mm and thoracic and lumbar paraspinals.  Rows with theraband.  Shoulder pulleys Cont with Aquatic therapy.  Assess response/tolerance.  May try dry needling next visit.  Signa Drier PT DPT 08/05/23 8:13 AM

## 2023-08-04 NOTE — Patient Instructions (Signed)

## 2023-08-05 ENCOUNTER — Encounter (HOSPITAL_BASED_OUTPATIENT_CLINIC_OR_DEPARTMENT_OTHER): Payer: Self-pay | Admitting: Physical Therapy

## 2023-08-07 ENCOUNTER — Encounter (HOSPITAL_BASED_OUTPATIENT_CLINIC_OR_DEPARTMENT_OTHER): Admitting: Physical Therapy

## 2023-08-08 ENCOUNTER — Ambulatory Visit: Payer: Self-pay | Admitting: Cardiovascular Disease

## 2023-08-08 DIAGNOSIS — I701 Atherosclerosis of renal artery: Secondary | ICD-10-CM

## 2023-08-13 ENCOUNTER — Encounter (HOSPITAL_BASED_OUTPATIENT_CLINIC_OR_DEPARTMENT_OTHER): Admitting: Physical Therapy

## 2023-08-14 ENCOUNTER — Encounter: Payer: Self-pay | Admitting: Pulmonary Disease

## 2023-08-14 NOTE — Telephone Encounter (Signed)
**Note De-identified  [place] Obfuscation** Please advise 

## 2023-08-15 ENCOUNTER — Encounter (HOSPITAL_BASED_OUTPATIENT_CLINIC_OR_DEPARTMENT_OTHER): Admitting: Physical Therapy

## 2023-08-17 ENCOUNTER — Other Ambulatory Visit: Payer: Self-pay | Admitting: Pulmonary Disease

## 2023-08-17 MED ORDER — BENZONATATE 200 MG PO CAPS
200.0000 mg | ORAL_CAPSULE | Freq: Three times a day (TID) | ORAL | 0 refills | Status: DC | PRN
Start: 2023-08-17 — End: 2023-12-23

## 2023-08-18 ENCOUNTER — Other Ambulatory Visit: Payer: Self-pay | Admitting: Pulmonary Disease

## 2023-08-18 MED ORDER — PREDNISONE 20 MG PO TABS: 20.0000 mg | ORAL_TABLET | Freq: Every day | ORAL | 0 refills | Status: DC

## 2023-08-18 MED ORDER — AZITHROMYCIN 250 MG PO TABS: ORAL_TABLET | ORAL | 0 refills | Status: DC

## 2023-08-19 ENCOUNTER — Ambulatory Visit (HOSPITAL_BASED_OUTPATIENT_CLINIC_OR_DEPARTMENT_OTHER): Attending: Family | Admitting: Physical Therapy

## 2023-08-19 DIAGNOSIS — R262 Difficulty in walking, not elsewhere classified: Secondary | ICD-10-CM | POA: Diagnosis present

## 2023-08-19 DIAGNOSIS — M62830 Muscle spasm of back: Secondary | ICD-10-CM | POA: Diagnosis present

## 2023-08-19 DIAGNOSIS — R278 Other lack of coordination: Secondary | ICD-10-CM | POA: Diagnosis present

## 2023-08-19 DIAGNOSIS — R293 Abnormal posture: Secondary | ICD-10-CM | POA: Diagnosis present

## 2023-08-19 DIAGNOSIS — M5459 Other low back pain: Secondary | ICD-10-CM | POA: Insufficient documentation

## 2023-08-19 DIAGNOSIS — M546 Pain in thoracic spine: Secondary | ICD-10-CM | POA: Diagnosis present

## 2023-08-19 DIAGNOSIS — M6281 Muscle weakness (generalized): Secondary | ICD-10-CM | POA: Insufficient documentation

## 2023-08-19 NOTE — Therapy (Signed)
 OUTPATIENT PHYSICAL THERAPY TREATMENT  Progress Note Reporting Period 07/08/2023 to 08/19/2023  See note below for Objective Data and Assessment of Progress/Goals.           Patient Name: Kristen Suarez MRN: [id number] DOB:Apr 16, 1945, 78 y.o., female Today's Date: 08/20/2023  END OF SESSION:  PT End of Session - 08/19/23 1417     Visit Number 18    Number of Visits 30    Date for PT Re-Evaluation 09/30/23    Authorization Type MCR A and B    PT Start Time 1321    PT Stop Time 1406    PT Time Calculation (min) 45 min    Activity Tolerance Patient tolerated treatment well    Behavior During Therapy [facility] for tasks assessed/performed                          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/1976   Weight loss, unintentional 12/31/2022   Past Surgical History:  Procedure Laterality Date   ABDOMINAL HYSTERECTOMY  1987   BASAL CELL CARCINOMA EXCISION  06/30/2020   nose   CARDIAC CATHETERIZATION     CLIPPING OF ATRIAL APPENDAGE N/A 01/04/2022   Procedure: CLIPPING OF ATRIAL APPENDAGE USING SIZE 40 ATRICURE ATRIAL CLIP;  Surgeon: Eleanora Grew, MD;  Location: [facility] OR;  Service: Open Heart Surgery;  Laterality: N/A;   CORONARY ARTERY BYPASS GRAFT N/A 01/04/2022   Procedure: CORONARY ARTERY BYPASS GRAFTING (CABG) x  USING ENDOSCOPIC GREATER SAPHENOUS VEIN HARVEST;  Surgeon: Eleanora Grew, MD;  Location: [facility] OR;  Service: Open Heart Surgery;  Laterality: N/A;   LEFT HEART CATH AND CORONARY ANGIOGRAPHY N/A 01/02/2022   Procedure: LEFT HEART CATH AND CORONARY  ANGIOGRAPHY;  Surgeon: Millicent Ally, MD;  Location: [facility] INVASIVE CV LAB;  Service: Cardiovascular;  Laterality: N/A;   LUNG BIOPSY  06/16/1976   PERIPHERAL VASCULAR INTERVENTION Right 11/29/2020   Procedure: PERIPHERAL VASCULAR INTERVENTION;  Surgeon: Wenona Hamilton, MD;  Location: [facility] INVASIVE CV LAB;  Service: Cardiovascular;  Laterality: Right;  Renal Artery   RENAL ANGIOGRAPHY N/A 11/29/2020   Procedure: RENAL ANGIOGRAPHY;  Surgeon: Wenona Hamilton, MD;  Location: [facility] INVASIVE CV LAB;  Service: Cardiovascular;  Laterality: N/A;   TEE WITHOUT CARDIOVERSION N/A 01/04/2022   Procedure: TRANSESOPHAGEAL ECHOCARDIOGRAM (TEE);  Surgeon: Eleanora Grew, MD;  Location: [facility] OR;  Service: Open Heart Surgery;  Laterality: N/A;   Patient Active Problem List   Diagnosis Date Noted   PAF (paroxysmal atrial fibrillation) (HCC) 07/02/2023   Memory impairment 03/21/2023   Adjustment insomnia 08/19/2022   Nocturia more than twice per night 08/19/2022   Bilateral sensorineural hearing loss 06/27/2022   Hearing loss of right ear 02/04/2022   Hx of CABG 01/04/2022   Elevated coronary artery calcium  score    Chronic kidney disease, stage 3b (HCC) 01/01/2022   Syncope 12/31/2021   Renal artery stenosis (HCC) 09/25/2021   Aortic valve sclerosis  09/25/2021   Snoring 04/13/2021   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   Epistaxis 03/29/2021   Coronary artery disease due to calcified coronary lesion 08/15/2020   Basal cell carcinoma 06/30/2020   History of loop recorder 12/13/2019   Allergic rhinitis 12/08/2019   Chronic cough 11/08/2019   Post-nasal drip 11/08/2019   Multiple joint pain 04/22/2019   Bilateral leg edema 04/22/2019   Vaginal atrophy 01/05/2019   Resting tremor 09/08/2018   Sarcoidosis 05/08/2018   HTN (hypertension) 05/08/2018   Colon polyp 05/08/2018   Family hx of colon cancer 05/08/2018   Cystocele, midline 05/08/2018   Hyperlipidemia 05/08/2018   Vasovagal syncope 05/08/2018    Seborrheic keratosis 05/08/2018   Pseudophakia of both eyes 05/08/2018   GERD (gastroesophageal reflux disease) 05/08/2018   COPD (chronic obstructive pulmonary disease) (HCC) 05/08/2018   Osteoporosis 12/31/2016     REFERRING PROVIDER:  Clearnce Curia, NP    Syliva Even, MD      REFERRING DIAG:  [phone] (ICD-10-CM) - Chronic bilateral low back pain without sciatica  M54.6,G89.29 (ICD-10-CM) - Chronic left-sided thoracic back pain   R06.09 (ICD-10-CM) - Exertional dyspnea              R53.81 (ICD-10-CM) - Physical deconditioning  THERAPY DIAG:  Muscle weakness (generalized)  Pain in thoracic spine  Other low back pain  Difficulty in walking, not elsewhere classified  Abnormal posture  Rationale for Evaluation and Treatment: Rehabilitation  ONSET DATE: PT order 04/24/2023  SUBJECTIVE:   SUBJECTIVE STATEMENT:  Pt had a MOHS procedure on 5/21 to remove a basal cell carcinoma above her lip.  Pt was cleared for PT last week.  Pt states she has performed stretches, but that's it.    Pt states she hurt for a couple of days after the dry needling and then it went away.  Pt states it may have helped a little though she feels about the same.  The more active she is, the more the spot hurts.  Pain is a frustration to me because I can't stay active.     Pt reports a 50% improvement with tolerance with daily activities and functional endurance with mobility.  Pt has to take breaks with household chores.  She states she can work about 30 mins in her home and in the garden before she needs a break.   Pt reports a 50% improvement in her thoracic and lumbar pain overall.  Pt has to take a rest in the afternoon around 2 PM due to fatigue and pain.  Pt is using her arm more with daily activities though still has increased pain.    Pt enjoyed performing aquatic therapy and didn't have SOB with aquatic exercises.     PERTINENT HISTORY: Chronic thoracic pain and lumbar  pain CAD s/p CABG x 4 on 01/04/2022 Osteoporosis syncope, pulmonary sarcoidosis, implanted loop recorder Arthritis and HTN Memory problems since CABG surgery--she is being followed by neurology.    PAIN:  4/10 L scapular pain current, 10/10 worst, 1/10 best Pt doesn't know what increases her pain.              0/10 R scapular pain, 5-6/10 worst "Very little" pain in lumbar currently ; 9-10/10 worst  Easing factors:  heating pad, tylenol  Aggravating factors:  Pt not sure what increases her sx's.   PRECAUTIONS: Other: Implanted loop recorder, osteoporosis, balance, CABG, syncope    WEIGHT BEARING RESTRICTIONS: No  FALLS:  Has patient fallen in last 6 months?  Yes. Number of falls 1 on 02/23/2023, syncope  LIVING ENVIRONMENT: Lives with: lives with their spouse Lives in:  1 story home Stairs: 2-4 steps to enter/exit home with rail Has following equipment at home: Single point cane, Walker - 4 wheeled, and walking sticks  OCCUPATION: Pt is retired  PLOF: Independent  PATIENT GOALS: to walk a straight line, to have improved activity tolerance after a short rest break, be more flexible    OBJECTIVE:  Note: Objective measures were completed at Evaluation unless otherwise noted.  DIAGNOSTIC FINDINGS:  Lumbar X rays on 3/4: FINDINGS: Five lumbar type vertebral bodies are well visualized. Vertebral body height is well maintained. Disc space narrowing is noted at L5-S1. No anterolisthesis is seen. No soft tissue abnormality is noted. Right renal artery stent is seen.   IMPRESSION: Degenerative change without acute abnormality.   CT chest on 3/5: IMPRESSION: *Stable extensive fibro atelectatic fibrocalcific changes of both lungs with significant bilateral bronchiectasis and scar changes involving particularly the upper and mid lung distributions with significant central pulmonary retraction and traction bronchiectasis and architectural distortion. These findings correlate  with chronic interstitial lung disease and correlate with sarcoidosis stage IV. *No new significant findings. No acute infiltrates or consolidations. *Stable multiple calcified mediastinal and hilar lymph nodes.Per MD note, Lumbar X-rays showed DDD worse L5-S1.  No acute fractures are visible.   TREATMENT DATE:  6/3 Pt has bruising around L side of lip and down the anterior portion of her neck due to the MOHS procedure.   Reviewed current function, goals, and pain levels  L shoulder AROM:  Initial/Current: Flexion:  111/143 Abd:  96/116  HR:  74 O2:  99%  3 MWT: Initial:  529 ft.   Pt felt SOB at 1 min 48 sec.  O2:  99%, HR:  104                        Prior:  559 ft  O2:  96%, HR:  104.  HR decreased to 83 and O2 improved to 99%    Current:  512 ft.  HR:  105, O2:  97%.  HR decreased quickly with rest.  After Rx:  O2:  99% HR:  79  INITIAL:  Modified Oswestry:  Initial:  24= 48%           Current:  22= 44%  LEFS:  Initial:  42/80                        Prior:  50/80    Current:  47/80                                                                                        PATIENT EDUCATION:  Education details:  PT answered pt's questions.  Goal progress, objective findings, and POC.  Person educated: Patient Education method: Explanation, Demonstration, Tactile cues, Verbal cues Education comprehension: verbalized understanding, returned demonstration, verbal cues required, tactile cues required, and needs further education      HOME EXERCISE PROGRAM: Access Code: [email or web address] URL: [email or web address] Date: 05/29/2023 Prepared by: Mayme Spearman  Exercises - Standing Tandem Balance with Counter Support  - 2-3 x daily - 1-2 sets - 1-2 reps - 30sec hold - Side Stepping with Counter Support  - 2-3 x daily - 1 sets - 8 reps - Backward Walking with Counter Support  - 2-3 x daily - 1 sets - 8 reps   - Seated Scapular Retraction  - 2 x daily - 7 x weekly -  1-2 sets - 10 reps - Standing Shoulder Row with Anchored Resistance  - 1 x daily - 3-4 x weekly - 2 sets - 10 reps  ASSESSMENT:  CLINICAL IMPRESSION: Pt has recently begun aquatic therapy and has had 1 dry needling treatment.  Pt enjoyed performing aquatic therapy and didn't have SOB with aquatic exercises. She has missed recent PT due to having a MOHS procedure done.  Pt reports a 50% improvement with tolerance with daily activities and functional endurance with mobility and also in her thoracic and lumbar pain overall.  She continues to have pain in her thoracic with activity.  Pt has to take breaks with household chores and still needs a rest in the afternoon due to fatigue and pain.  Pt is using her arm more with daily activities though still has increased pain.  Pt demonstrates improved L shoulder flexion and Abd AROM.  Pt performed 3 MWT and had a worse performance today.  She ambulated decreased distance today and continues to have SOB with walk test.  Pt continues to have limited mobility and tolerance to activity, but may benefit from continued land based  and aquatic therapy.  She met STG #5 and LTG #7 and partially met LTG's #1,6.  Pt should continue to benefit from cont skilled PT to address ongoing goals, improve pain, and to assist maximizing functional mobility and tolerance to activity.   OBJECTIVE IMPAIRMENTS: decreased activity tolerance, decreased balance, decreased endurance, decreased mobility, difficulty walking, decreased strength, and pain.   ACTIVITY LIMITATIONS: standing, squatting, transfers, and locomotion level  PARTICIPATION LIMITATIONS: cleaning, shopping, and community activity  PERSONAL FACTORS: 3+ comorbidities: osteoporosis, CABG, arthritis, syncope, pulmonary sarcoidosis are also affecting patient's functional outcome.   REHAB POTENTIAL: Good  CLINICAL DECISION MAKING: Stable/uncomplicated  EVALUATION COMPLEXITY: Low   GOALS:  SHORT TERM GOALS: Target  date: 06/11/2023  Pt will be independent and compliant with HEP for improved strength, tolerance to activity, functional endurance, pain, and mobility.   Baseline: 06/12/23: HEP ongoing Goal status: ONGOING  2.  Pt will report at least a 25% improvement in tolerance to activity and her functional mobility.  Baseline:  06/12/23: reports 35-40% improvement, particularly with housework Goal status: MET  3.  Pt will perform 5x STS test in no > than 19 sec for improved functional LE strength and performance of transfers.   Baseline:  06/12/23: 13.95sec no UE support Goal status: MET  4.  Pt will ambulate entire 6 MWT test.   Baseline:  Goal status: NOT MET  6/3  5.  Pt will report at least a 25% improvement in thoracic and lumbar pain and sx's overall.  Goal status:  GOAL MET   6/3  Target date:  07/16/2023    LONG TERM GOALS: Target date: 09/30/2023  Pt will report at least a 70% improvement in her tolerance with daily activities and functional endurance with mobility.    Baseline:    Goal status: 70% MET  6/3  2.  Pt will increase ambulation distance by at least 150 ft on 3 MWT.  Baseline:  Goal status: NOT MET  6/3  3.  Pt will report increased ambulation distance with reduced fatigue and SOB. Baseline:  Goal status: PROGRESSING  4.  Pt will perform 5x STS test in < than 16 sec for improved functional LE strength and performance of transfers.  Baseline:  Goal status: GOAL MET  3/27  5.  Pt will demo improved bilat LE strength to 4+/5 in hip flexion and 5/5 in knee extension and at least 6-8# increase in abd for improved performance of functional mobility and performance of household chores. Baseline:  Goal status: INITIAL  6.  Pt will report at least a 70% improvement in her thoracic and lumbar pain overall.    Goal status:  70% MET  6/3  Target date:  08/06/2023  7.  Pt will demo improved L shoulder flexion/abd AROM by 15/10 deg for improved UE elevation and reaching.    Goal status:  GOAL MET   6/3  Target date:  08/06/2023  8.  Pt will report she is able to use her L UE more with daily activities by at least 50% without significant pain.   Goal status:  ONGOING  Target date:  08/06/2023     PLAN:  PT FREQUENCY: 2x/week  PT DURATION: 6 Weeks  PLANNED INTERVENTIONS: [id number]- PT Re-evaluation, 97110-Therapeutic exercises, 97530- Therapeutic activity, [id number]- Neuromuscular re-education, 97535- Self Care, [id number]- Manual therapy, [id number]- Gait training, [phone]- Aquatic Therapy, [phone]- Ultrasound, Patient/Family education, Balance training, Stair training, Taping, Dry Needling, Cryotherapy, and Moist heat  PLAN FOR NEXT SESSION:  review/update HEP PRN.  Cont with gait, ambulation, balance, functional endurance, and  LE strengthening.  Monitoring activity tolerance via borg dyspnea scale and/or vitals. NO e-stim STM to medial scap mm and thoracic and lumbar paraspinals.  Rows with theraband.  Shoulder pulleys Cont with Aquatic therapy.  Assess response/tolerance.  dry needling next visit.  Trina Fujita III PT, DPT 08/20/23 11:32 PM

## 2023-08-20 ENCOUNTER — Encounter (HOSPITAL_BASED_OUTPATIENT_CLINIC_OR_DEPARTMENT_OTHER): Payer: Self-pay | Admitting: Physical Therapy

## 2023-08-21 ENCOUNTER — Ambulatory Visit (HOSPITAL_BASED_OUTPATIENT_CLINIC_OR_DEPARTMENT_OTHER): Admitting: Physical Therapy

## 2023-08-21 DIAGNOSIS — M6281 Muscle weakness (generalized): Secondary | ICD-10-CM

## 2023-08-21 DIAGNOSIS — R262 Difficulty in walking, not elsewhere classified: Secondary | ICD-10-CM

## 2023-08-21 DIAGNOSIS — M546 Pain in thoracic spine: Secondary | ICD-10-CM

## 2023-08-21 DIAGNOSIS — M5459 Other low back pain: Secondary | ICD-10-CM

## 2023-08-21 NOTE — Therapy (Signed)
 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/1976   Weight loss, unintentional 12/31/2022   Past Surgical History:  Procedure Laterality Date   ABDOMINAL HYSTERECTOMY  1987   BASAL CELL CARCINOMA EXCISION  06/30/2020   nose   CARDIAC CATHETERIZATION     CLIPPING OF ATRIAL APPENDAGE N/A 01/04/2022   Procedure: CLIPPING OF ATRIAL APPENDAGE USING SIZE 40 ATRICURE ATRIAL CLIP;  Surgeon: Eleanora Grew, MD;  Location: [facility] OR;  Service: Open Heart Surgery;  Laterality: N/A;   CORONARY ARTERY BYPASS GRAFT N/A 01/04/2022   Procedure: CORONARY ARTERY BYPASS GRAFTING (CABG) x  USING ENDOSCOPIC GREATER SAPHENOUS VEIN HARVEST;  Surgeon: Eleanora Grew, MD;  Location: [facility] OR;  Service: Open Heart Surgery;  Laterality: N/A;   LEFT HEART CATH AND CORONARY ANGIOGRAPHY N/A 01/02/2022   Procedure: LEFT HEART CATH AND CORONARY ANGIOGRAPHY;  Surgeon: Millicent Ally, MD;  Location: [facility] INVASIVE CV [facility];  Service: Cardiovascular;  Laterality: N/A;   LUNG BIOPSY  06/16/1976   PERIPHERAL VASCULAR INTERVENTION Right 11/29/2020   Procedure: PERIPHERAL VASCULAR INTERVENTION;  Surgeon: Wenona Hamilton, MD;  Location: [facility]VAS[facility];  Service: Cardiovascular;  Laterality: Right;  Renal Artery   RENAL ANGIOGRAPHY N/A 11/29/2020   Procedure: RENAL ANGIOGRAPHY;  Surgeon: Wenona Hamilton, MD;  Location: [facility]  INVASIVE CV LAB;  Service: Cardiovascular;  Laterality: N/A;   TEE WITHOUT CARDIOVERSION N/A 01/04/2022   Procedure: TRANSESOPHAGEAL ECHOCARDIOGRAM (TEE);  Surgeon: Eleanora Grew, MD;  Location: [facility] OR;  Service: Open Heart Surgery;  Laterality: N/A;   Patient Active Problem List   Diagnosis Date Noted   PAF (paroxysmal atrial fibrillation) (HCC) 07/02/2023   Memory impairment 03/21/2023   Adjustment insomnia 08/19/2022   Nocturia more than twice per night 08/19/2022   Bilateral sensorineural hearing loss 06/27/2022   Hearing loss of right ear 02/04/2022   Hx of CABG 01/04/2022   Elevated coronary artery calcium  score    Chronic kidney disease, stage 3b (HCC) 01/01/2022   Syncope 12/31/2021   Renal artery stenosis (HCC) 09/25/2021   Aortic valve sclerosis 09/25/2021   Snoring 04/13/2021   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   Epistaxis 03/29/2021   Coronary artery disease due to calcified coronary lesion 08/15/2020   Basal cell carcinoma 06/30/2020   History of loop recorder 12/13/2019   Allergic rhinitis 12/08/2019   Chronic cough 11/08/2019   Post-nasal drip 11/08/2019   Multiple joint pain 04/22/2019   Bilateral leg edema 04/22/2019   Vaginal atrophy 01/05/2019   Resting tremor 09/08/2018   Sarcoidosis 05/08/2018   HTN (hypertension) 05/08/2018   Colon polyp 05/08/2018   Family hx of colon cancer 05/08/2018   Cystocele, midline 05/08/2018   Hyperlipidemia 05/08/2018  Vasovagal syncope 05/08/2018   Seborrheic keratosis 05/08/2018   Pseudophakia of both eyes 05/08/2018   GERD (gastroesophageal reflux disease) 05/08/2018   COPD (chronic obstructive pulmonary disease) (HCC) 05/08/2018   Osteoporosis 12/31/2016     REFERRING PROVIDER:  Clearnce Curia, NP    Syliva Even, MD      REFERRING DIAG:  [phone] (ICD-10-CM) - Chronic bilateral low back pain without sciatica  M54.6,G89.29 (ICD-10-CM) - Chronic left-sided thoracic back pain   R06.09 (ICD-10-CM) -  Exertional dyspnea              R53.81 (ICD-10-CM) - Physical deconditioning  THERAPY DIAG:  No diagnosis found.  Rationale for Evaluation and Treatment: Rehabilitation  ONSET DATE: PT order 04/24/2023  SUBJECTIVE:   SUBJECTIVE STATEMENT:  Pt had a MOHS procedure on 5/21 to remove a basal cell carcinoma above her lip.  Pt was cleared for PT last week.  Pt states she is not supposed to lift > 10#.   Pt had had a persistent cough and began using azithromycin  and prednisone  yesterday.  Pt states she feels fine to perform exercises.   Pt denies any adverse effects after prior Rx.    Pt states she hurt for a couple of days after the dry needling and then it went away.  Pt states it may have helped a little though she feels about the same.  The more active she is, the more the spot hurts.  Pain is a frustration to me because I can't stay active.     Pt reports a 50% improvement with tolerance with daily activities and functional endurance with mobility.  Pt has to take breaks with household chores.  She states she can work about 30 mins in her home and in the garden before she needs a break.   Pt reports a 50% improvement in her thoracic and lumbar pain overall.  Pt has to take a rest in the afternoon around 2 PM due to fatigue and pain.  Pt is using her arm more with daily activities though still has increased pain.    Pt enjoyed performing aquatic therapy and didn't have SOB with aquatic exercises.     PERTINENT HISTORY: Chronic thoracic pain and lumbar pain CAD s/p CABG x 4 on 01/04/2022 Osteoporosis syncope, pulmonary sarcoidosis, implanted loop recorder Arthritis and HTN Memory problems since CABG surgery--she is being followed by neurology.    PAIN:  4-5/10 L scapular pain current, 10/10 worst, 1/10 best 0/10 lumbar ; 9-10/10 worst  Easing factors:  heating pad, tylenol  Aggravating factors:  Pt not sure what increases her sx's.   PRECAUTIONS: Other: Implanted loop recorder,  osteoporosis, balance, CABG, syncope    WEIGHT BEARING RESTRICTIONS: No  FALLS:  Has patient fallen in last 6 months? Yes. Number of falls 1 on 02/23/2023, syncope  LIVING ENVIRONMENT: Lives with: lives with their spouse Lives in:  1 story home Stairs: 2-4 steps to enter/exit home with rail Has following equipment at home: Single point cane, Walker - 4 wheeled, and walking sticks  OCCUPATION: Pt is retired  PLOF: Independent  PATIENT GOALS: to walk a straight line, to have improved activity tolerance after a short rest break, be more flexible    OBJECTIVE:  Note: Objective measures were completed at Evaluation unless otherwise noted.  DIAGNOSTIC FINDINGS:  Lumbar X rays on 3/4: FINDINGS: Five lumbar type vertebral bodies are well visualized. Vertebral body height is well maintained. Disc space narrowing is noted at L5-S1. No anterolisthesis is  seen. No soft tissue abnormality is noted. Right renal artery stent is seen.   IMPRESSION: Degenerative change without acute abnormality.   CT chest on 3/5: IMPRESSION: *Stable extensive fibro atelectatic fibrocalcific changes of both lungs with significant bilateral bronchiectasis and scar changes involving particularly the upper and mid lung distributions with significant central pulmonary retraction and traction bronchiectasis and architectural distortion. These findings correlate with chronic interstitial lung disease and correlate with sarcoidosis stage IV. *No new significant findings. No acute infiltrates or consolidations. *Stable multiple calcified mediastinal and hilar lymph nodes.Per MD note, Lumbar X-rays showed DDD worse L5-S1.  No acute fractures are visible.   TREATMENT DATE:  6/5  HR:  85 O2:  99%  Nustep x 3 mins at L3 UE/LE's HR:  91 O2:  97%  Pulleys in flexion and scaption 2x10 each Standing rows with RTB 2x10 Sidestepping  x 2 laps at [facility] without UE support Tandem gait with UE support on rail x 3  laps  O2:  67-99% HR: 94-96  Manual Therapy: Gentle STM to L medial scap mm seated   After Rx:  O2:  99-99% HR:  80                                                                                          PATIENT EDUCATION:  Education details:  PT answered pt's questions.  Exercise form, rationale of interventions, and POC.  Person educated: Patient Education method: Explanation, Demonstration, Tactile cues, Verbal cues Education comprehension: verbalized understanding, returned demonstration, verbal cues required, tactile cues required, and needs further education      HOME EXERCISE PROGRAM: Access Code: [email or web address] URL: [email or web address] Date: 05/29/2023 Prepared by: Mayme Spearman  Exercises - Standing Tandem Balance with Counter Support  - 2-3 x daily - 1-2 sets - 1-2 reps - 30sec hold - Side Stepping with Counter Support  - 2-3 x daily - 1 sets - 8 reps - Backward Walking with Counter Support  - 2-3 x daily - 1 sets - 8 reps   - Seated Scapular Retraction  - 2 x daily - 7 x weekly - 1-2 sets - 10 reps - Standing Shoulder Row with Anchored Resistance  - 1 x daily - 3-4 x weekly - 2 sets - 10 reps  ASSESSMENT:  CLINICAL IMPRESSION: Pt has recently begun aquatic therapy and has had 1 dry needling treatment.  Pt enjoyed performing aquatic therapy and didn't have SOB with aquatic exercises. She has missed recent PT due to having a MOHS procedure done.  Pt reports a 50% improvement with tolerance with daily activities and functional endurance with mobility and also in her thoracic and lumbar pain overall.  She continues to have pain in her thoracic with activity.  Pt has to take breaks with household chores and still needs a rest in the afternoon due to fatigue and pain.  Pt is using her arm more with daily activities though still has increased pain.  Pt demonstrates improved L shoulder flexion and Abd AROM.  Pt performed 3 MWT and had a worse performance  today.  She ambulated decreased distance today and continues to have SOB  with walk test.  Pt continues to have limited mobility and tolerance to activity, but may benefit from continued land based  and aquatic therapy.  She met STG #5 and LTG #7 and partially met LTG's #1,6.  Pt should continue to benefit from cont skilled PT to address ongoing goals, improve pain, and to assist maximizing functional mobility and tolerance to activity.   4/10 pain  OBJECTIVE IMPAIRMENTS: decreased activity tolerance, decreased balance, decreased endurance, decreased mobility, difficulty walking, decreased strength, and pain.   ACTIVITY LIMITATIONS: standing, squatting, transfers, and locomotion level  PARTICIPATION LIMITATIONS: cleaning, shopping, and community activity  PERSONAL FACTORS: 3+ comorbidities: osteoporosis, CABG, arthritis, syncope, pulmonary sarcoidosis are also affecting patient's functional outcome.   REHAB POTENTIAL: Good  CLINICAL DECISION MAKING: Stable/uncomplicated  EVALUATION COMPLEXITY: Low   GOALS:  SHORT TERM GOALS: Target date: 06/11/2023  Pt will be independent and compliant with HEP for improved strength, tolerance to activity, functional endurance, pain, and mobility.   Baseline: 06/12/23: HEP ongoing Goal status: ONGOING  2.  Pt will report at least a 25% improvement in tolerance to activity and her functional mobility.  Baseline:  06/12/23: reports 35-40% improvement, particularly with housework Goal status: MET  3.  Pt will perform 5x STS test in no > than 19 sec for improved functional LE strength and performance of transfers.   Baseline:  06/12/23: 13.95sec no UE support Goal status: MET  4.  Pt will ambulate entire 6 MWT test.   Baseline:  Goal status: NOT MET  6/3  5.  Pt will report at least a 25% improvement in thoracic and lumbar pain and sx's overall.  Goal status:  GOAL MET   6/3  Target date:  07/16/2023    LONG TERM GOALS: Target date:  09/30/2023  Pt will report at least a 70% improvement in her tolerance with daily activities and functional endurance with mobility.    Baseline:    Goal status: 70% MET  6/3  2.  Pt will increase ambulation distance by at least 150 ft on 3 MWT.  Baseline:  Goal status: NOT MET  6/3  3.  Pt will report increased ambulation distance with reduced fatigue and SOB. Baseline:  Goal status: PROGRESSING  4.  Pt will perform 5x STS test in < than 16 sec for improved functional LE strength and performance of transfers.  Baseline:  Goal status: GOAL MET  3/27  5.  Pt will demo improved bilat LE strength to 4+/5 in hip flexion and 5/5 in knee extension and at least 6-8# increase in abd for improved performance of functional mobility and performance of household chores. Baseline:  Goal status: INITIAL  6.  Pt will report at least a 70% improvement in her thoracic and lumbar pain overall.    Goal status:  70% MET  6/3  Target date:  08/06/2023  7.  Pt will demo improved L shoulder flexion/abd AROM by 15/10 deg for improved UE elevation and reaching.   Goal status:  GOAL MET   6/3  Target date:  08/06/2023  8.  Pt will report she is able to use her L UE more with daily activities by at least 50% without significant pain.   Goal status:  ONGOING  Target date:  08/06/2023     PLAN:  PT FREQUENCY: 2x/week  PT DURATION: 6 Weeks  PLANNED INTERVENTIONS: 16109- PT Re-evaluation, 97110-Therapeutic exercises, 97530- Therapeutic activity, [id number]- Neuromuscular re-education, 97535- Self Care, [id number]- Manual therapy, [id number]- Gait training, [phone]- Aquatic  Therapy, 97035- Ultrasound, Patient/Family education, Balance training, Stair training, Taping, Dry Needling, Cryotherapy, and Moist heat  PLAN FOR NEXT SESSION:  review/update HEP PRN.  Cont with gait, ambulation, balance, functional endurance, and  LE strengthening.  Monitoring activity tolerance via borg dyspnea scale and/or vitals. NO e-stim STM to  medial scap mm and thoracic and lumbar paraspinals.  Rows with theraband.  Shoulder pulleys Cont with Aquatic therapy.  Assess response/tolerance.  dry needling next visit.  Trina Fujita III PT, DPT 08/21/23 11:12 AM

## 2023-08-22 ENCOUNTER — Encounter (HOSPITAL_BASED_OUTPATIENT_CLINIC_OR_DEPARTMENT_OTHER): Payer: Self-pay | Admitting: Physical Therapy

## 2023-08-25 ENCOUNTER — Ambulatory Visit (INDEPENDENT_AMBULATORY_CARE_PROVIDER_SITE_OTHER): Admitting: Family Medicine

## 2023-08-25 ENCOUNTER — Other Ambulatory Visit: Payer: Self-pay

## 2023-08-25 VITALS — BP 148/72 | HR 74 | Ht 62.0 in | Wt 129.0 lb

## 2023-08-25 DIAGNOSIS — G8929 Other chronic pain: Secondary | ICD-10-CM

## 2023-08-25 DIAGNOSIS — M546 Pain in thoracic spine: Secondary | ICD-10-CM | POA: Diagnosis not present

## 2023-08-25 NOTE — Progress Notes (Signed)
   Joanna Muck, PhD, LAT, ATC acting as a scribe for Kristen Juniper, MD.  Kristen Suarez is a 78 y.o. female who presents to [facility] today for 33-month f/u thoracic back pain. Pt was last seen by Dr. Alease Hunter on 07/21/23 and was advised to give PT another month, completing 19 visits.  Today, pt reports back pain has improved, 50% better. PT has been sporadic, she has been working [employer]. PT did try dry needling treatment once. She is very much over this pain and it his limiting her activity level. Pain is still located around the L rhomboid/scapular area  Dx testing: 05/20/23 L-spine XR  05/01/23 DEXA scan  Pertinent review of systems: No fevers or chills  Relevant historical information: Sarcoidosis   Exam:  BP (!) 148/72   Pulse 74   Ht 5\' 2"  (1.575 m)   Wt 129 lb (58.5 kg)   SpO2 97%   BMI 23.59 kg/m  General: Well Developed, well nourished, and in no acute distress.   MSK: Left posterior chest wall thoracic kyphosis otherwise normal. Tender palpation left thoracic paraspinal musculature/rhomboid region.    Lab and Radiology Results  Procedure: Real-time Ultrasound Guided Injection of left thoracic trigger point around rhomboid Device: Philips Affiniti 50G/GE Logiq Images permanently stored and available for review in PACS Verbal informed consent obtained.  Discussed risks and benefits of procedure. Warned about thorax, infection, bleeding, hyperglycemia damage to structures among others. Patient expresses understanding and agreement Time-out conducted.   Noted no overlying erythema, induration, or other signs of local infection.   Skin prepped in a sterile fashion.   Local anesthesia: Topical Ethyl chloride.   With sterile technique and under real time ultrasound guidance: 40 mg of Kenalog  and 1 mL of Marcaine  injected into musculature superficial to ribs posterior chest wall around rhomboid. Fluid seen entering the muscles.   Completed  without difficulty   Pain immediately resolved suggesting accurate placement of the medication.   Advised to call if fevers/chills, erythema, induration, drainage, or persistent bleeding.   Images permanently stored and available for review in the ultrasound unit.  Impression: Technically successful ultrasound guided injection.        Assessment and Plan: 78 y.o. female with left posterior chest wall pain.  Patient has failed to improve with good conservative workup and treatment trial.  She is having a little bit of benefit with physical therapy.  Will try trigger point injection today.  We have talked about this a lot in the past and would like to try to avoid these interventions around the chest wall as she is at risk for complications from a pneumothorax which can occur with trigger point injections in this region and with dry needling.  Based on the difficulty getting this under control make sense doing a trigger point injection today.  She tolerated it well.   PDMP not reviewed this encounter. Orders Placed This Encounter  Procedures   US  LIMITED JOINT SPACE STRUCTURES UP LEFT(NO LINKED CHARGES)    Reason for Exam (SYMPTOM  OR DIAGNOSIS REQUIRED):   trigger point    Preferred imaging location?:   [facility]   No orders of the defined types were placed in this encounter.    Discussed warning signs or symptoms. Please see discharge instructions. Patient expresses understanding.   The above documentation has been reviewed and is accurate and complete Kristen Suarez, M.D.

## 2023-08-25 NOTE — Patient Instructions (Signed)
 Thank you for coming in today.   You received an injection today. Seek immediate medical attention if the joint becomes red, extremely painful, or is oozing fluid.

## 2023-08-26 ENCOUNTER — Ambulatory Visit (HOSPITAL_BASED_OUTPATIENT_CLINIC_OR_DEPARTMENT_OTHER): Payer: Self-pay | Admitting: Physical Therapy

## 2023-08-26 DIAGNOSIS — M6281 Muscle weakness (generalized): Secondary | ICD-10-CM

## 2023-08-26 DIAGNOSIS — M546 Pain in thoracic spine: Secondary | ICD-10-CM

## 2023-08-26 DIAGNOSIS — R262 Difficulty in walking, not elsewhere classified: Secondary | ICD-10-CM

## 2023-08-26 DIAGNOSIS — M5459 Other low back pain: Secondary | ICD-10-CM

## 2023-08-26 NOTE — Therapy (Signed)
 OUTPATIENT PHYSICAL THERAPY TREATMENT /progress note           Patient Name: Kristen Suarez MRN: [id number] DOB:01/19/1946, 78 y.o., female Today's Date: 08/27/2023  END OF SESSION:  PT End of Session - 08/27/23 1039     Visit Number 20    Number of Visits 30    Date for PT Re-Evaluation 09/30/23    Authorization Type MCR A and B    PT Start Time 1600    PT Stop Time 1643    PT Time Calculation (min) 43 min    Activity Tolerance Patient tolerated treatment well    Behavior During Therapy [facility] for tasks assessed/performed                           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/1976   Weight loss, unintentional 12/31/2022   Past Surgical History:  Procedure Laterality Date   ABDOMINAL HYSTERECTOMY  1987   BASAL CELL CARCINOMA EXCISION  06/30/2020   nose   CARDIAC CATHETERIZATION     CLIPPING OF ATRIAL APPENDAGE N/A 01/04/2022   Procedure: CLIPPING OF ATRIAL APPENDAGE USING SIZE 40 ATRICURE ATRIAL CLIP;  Surgeon: Eleanora Grew, MD;  Location: [facility] OR;  Service: Open Heart Surgery;  Laterality: N/A;   CORONARY ARTERY BYPASS GRAFT N/A 01/04/2022   Procedure: CORONARY ARTERY BYPASS GRAFTING (CABG) x  USING ENDOSCOPIC GREATER SAPHENOUS VEIN HARVEST;  Surgeon: Eleanora Grew, MD;  Location: [facility] OR;  Service: Open Heart Surgery;  Laterality: N/A;   LEFT HEART CATH AND CORONARY ANGIOGRAPHY N/A 01/02/2022   Procedure: LEFT HEART CATH AND CORONARY ANGIOGRAPHY;  Surgeon: Millicent Ally, MD;  Location: [facility] INVASIVE CV LAB;  Service: Cardiovascular;   Laterality: N/A;   LUNG BIOPSY  06/16/1976   PERIPHERAL VASCULAR INTERVENTION Right 11/29/2020   Procedure: PERIPHERAL VASCULAR INTERVENTION;  Surgeon: Wenona Hamilton, MD;  Location: [facility] INVASIVE CV LAB;  Service: Cardiovascular;  Laterality: Right;  Renal Artery   RENAL ANGIOGRAPHY N/A 11/29/2020   Procedure: RENAL ANGIOGRAPHY;  Surgeon: Wenona Hamilton, MD;  Location: [facility] INVASIVE CV LAB;  Service: Cardiovascular;  Laterality: N/A;   TEE WITHOUT CARDIOVERSION N/A 01/04/2022   Procedure: TRANSESOPHAGEAL ECHOCARDIOGRAM (TEE);  Surgeon: Eleanora Grew, MD;  Location: [facility] OR;  Service: Open Heart Surgery;  Laterality: N/A;   Patient Active Problem List   Diagnosis Date Noted   PAF (paroxysmal atrial fibrillation) (HCC) 07/02/2023   Memory impairment 03/21/2023   Adjustment insomnia 08/19/2022   Nocturia more than twice per night 08/19/2022   Bilateral sensorineural hearing loss 06/27/2022   Hearing loss of right ear 02/04/2022   Hx of CABG 01/04/2022   Elevated coronary artery calcium  score    Chronic kidney disease, stage 3b (HCC) 01/01/2022   Syncope 12/31/2021   Renal artery stenosis (HCC) 09/25/2021   Aortic valve sclerosis 09/25/2021   Snoring 04/13/2021   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   Epistaxis 03/29/2021  Coronary artery disease due to calcified coronary lesion 08/15/2020   Basal cell carcinoma 06/30/2020   History of loop recorder 12/13/2019   Allergic rhinitis 12/08/2019   Chronic cough 11/08/2019   Post-nasal drip 11/08/2019   Multiple joint pain 04/22/2019   Bilateral leg edema 04/22/2019   Vaginal atrophy 01/05/2019   Resting tremor 09/08/2018   Sarcoidosis 05/08/2018   HTN (hypertension) 05/08/2018   Colon polyp 05/08/2018   Family hx of colon cancer 05/08/2018   Cystocele, midline 05/08/2018   Hyperlipidemia 05/08/2018   Vasovagal syncope 05/08/2018   Seborrheic keratosis 05/08/2018   Pseudophakia of both eyes 05/08/2018   GERD (gastroesophageal  reflux disease) 05/08/2018   COPD (chronic obstructive pulmonary disease) (HCC) 05/08/2018   Osteoporosis 12/31/2016   Progress Note Reporting Period  4/17 to 6/10  See note below for Objective Data and Assessment of Progress/Goals.      REFERRING PROVIDER:  Clearnce Curia, NP    Syliva Even, MD      REFERRING DIAG:  [phone] (ICD-10-CM) - Chronic bilateral low back pain without sciatica  M54.6,G89.29 (ICD-10-CM) - Chronic left-sided thoracic back pain   R06.09 (ICD-10-CM) - Exertional dyspnea              R53.81 (ICD-10-CM) - Physical deconditioning  THERAPY DIAG:  Muscle weakness (generalized)  Pain in thoracic spine  Other low back pain  Difficulty in walking, not elsewhere classified  Rationale for Evaluation and Treatment: Rehabilitation  ONSET DATE: PT order 04/24/2023  SUBJECTIVE:   SUBJECTIVE STATEMENT:  The patient had an injection yesterday. She did really well. She reports a significant improvement in pain. She feels like her neck is improving as well.    PERTINENT HISTORY: Chronic thoracic pain and lumbar pain CAD s/p CABG x 4 on 01/04/2022 Osteoporosis syncope, pulmonary sarcoidosis, implanted loop recorder Arthritis and HTN Memory problems since CABG surgery--she is being followed by neurology.    PAIN:  4-5/10 L scapular pain current, 10/10 worst, 1/10 best 0/10 lumbar ; 9-10/10 worst  Easing factors:  heating pad, tylenol  Aggravating factors:  Pt not sure what increases her sx's.   PRECAUTIONS: Other: Implanted loop recorder, osteoporosis, balance, CABG, syncope    WEIGHT BEARING RESTRICTIONS: No  FALLS:  Has patient fallen in last 6 months? Yes. Number of falls 1 on 02/23/2023, syncope  LIVING ENVIRONMENT: Lives with: lives with their spouse Lives in:  1 story home Stairs: 2-4 steps to enter/exit home with rail Has following equipment at home: Single point cane, Walker - 4 wheeled, and walking sticks  OCCUPATION: Pt is  retired  PLOF: Independent  PATIENT GOALS: to walk a straight line, to have improved activity tolerance after a short rest break, be more flexible    OBJECTIVE:  Note: Objective measures were completed at Evaluation unless otherwise noted.  DIAGNOSTIC FINDINGS:  Lumbar X rays on 3/4: FINDINGS: Five lumbar type vertebral bodies are well visualized. Vertebral body height is well maintained. Disc space narrowing is noted at L5-S1. No anterolisthesis is seen. No soft tissue abnormality is noted. Right renal artery stent is seen.   IMPRESSION: Degenerative change without acute abnormality.   CT chest on 3/5: IMPRESSION: *Stable extensive fibro atelectatic fibrocalcific changes of both lungs with significant bilateral bronchiectasis and scar changes involving particularly the upper and mid lung distributions with significant central pulmonary retraction and traction bronchiectasis and architectural distortion. These findings correlate with chronic interstitial lung disease and correlate with sarcoidosis stage IV. *No new significant findings. No acute  infiltrates or consolidations. *Stable multiple calcified mediastinal and hilar lymph nodes.Per MD note, Lumbar X-rays showed DDD worse L5-S1.  No acute fractures are visible.  LOWER EXTREMITY MMT:   MMT Right eval Left eval Right  6/10 Left 6/10  Hip flexion 4-/5 4/5 4/5 4+/5  Hip extension        Hip abduction 18.5 18.9    Hip adduction        Hip internal rotation        Hip external rotation        Knee flexion 5/5 4+/5 5 5   Knee extension 4/5 4-5 5 5   Ankle dorsiflexion 5/5 5/5 5 5   Ankle plantarflexion        Ankle inversion        Ankle eversion         (Blank rows = not tested)  Posture      FUNCTIONAL TESTS:  5x STS test: 24.05 without UE support 3 MWT:  529 ft.   Pt felt SOB at 1 min 48 sec.  O2:  99%, HR:  104  Walk test not performed today   6/10 Cervical ROM  Rotation 55 degrees right 60 degrees  left    TREATMENT DATE:  6/10 Manual:  Manual:  Skilled palpation of trigger points  Trigger poitn release to T-spaine and upper trap  Gross throacic PA mobilizations  [place] rib springing for thoracic  There-ex:   Neuro-re-ed:  Bilateral ER yellow 2x10  Horizontal abduction 2x10 yellow Bilateral flexion yellow 2x10   Reviewed how to use RPE and progress exercises  Updated exercises  6/5  HR:  85 O2:  99%  Nustep x 3 mins at L3 UE/LE's HR:  91 O2:  97%  Pulleys in flexion and scaption 2x10 each Standing rows with RTB 2x10 Sidestepping  x 2 laps at [facility] without UE support Tandem gait with UE support on rail x 3 laps  O2:  97-99% HR: 94-96  Manual Therapy: Gentle STM to L medial scap mm seated   After Rx:  O2:  98-99% HR:  80                                                                                     PATIENT EDUCATION:  Education details:  PT answered pt's questions.  Exercise form, rationale of interventions, and POC.  Person educated: Patient Education method: Explanation, Demonstration, Tactile cues, Verbal cues Education comprehension: verbalized understanding, returned demonstration, verbal cues required, tactile cues required, and needs further education      HOME EXERCISE PROGRAM: Access Code: [email or web address] URL: [email or web address] Date: 05/29/2023 Prepared by: Mayme Spearman  Exercises - Standing Tandem Balance with Counter Support  - 2-3 x daily - 1-2 sets - 1-2 reps - 30sec hold - Side Stepping with Counter Support  - 2-3 x daily - 1 sets - 8 reps - Backward Walking with Counter Support  - 2-3 x daily - 1 sets - 8 reps   - Seated Scapular Retraction  - 2 x daily - 7 x weekly - 1-2 sets - 10 reps - Standing Shoulder Row with Anchored Resistance  - 1 x daily - 3-4  x weekly - 2 sets - 10 reps  ASSESSMENT:  CLINICAL IMPRESSION:  Overall the patient is making progress. The pain in her shoulder has improved significantly  since her shot. She feels like she has improved function with her her shoulder and neck since the shot. We worked today on postural exercises. She was given an updated HEP. We will assess six minute walk test next visit. See goal specific progress below.    OBJECTIVE IMPAIRMENTS: decreased activity tolerance, decreased balance, decreased endurance, decreased mobility, difficulty walking, decreased strength, and pain.   ACTIVITY LIMITATIONS: standing, squatting, transfers, and locomotion level  PARTICIPATION LIMITATIONS: cleaning, shopping, and community activity  PERSONAL FACTORS: 3+ comorbidities: osteoporosis, CABG, arthritis, syncope, pulmonary sarcoidosis are also affecting patient's functional outcome.   REHAB POTENTIAL: Good  CLINICAL DECISION MAKING: Stable/uncomplicated  EVALUATION COMPLEXITY: Low   GOALS:  SHORT TERM GOALS: Target date: 06/11/2023  Pt will be independent and compliant with HEP for improved strength, tolerance to activity, functional endurance, pain, and mobility.   Baseline: 06/12/23: HEP ongoing Goal status: progressed HEP 6/11 2.  Pt will report at least a 25% improvement in tolerance to activity and her functional mobility.  Baseline:  06/12/23: reports 35-40% improvement, particularly with housework Goal status: MET  3.  Pt will perform 5x STS test in no > than 19 sec for improved functional LE strength and performance of transfers.   Baseline:  06/12/23: 13.95sec no UE support Goal status: MET  4.  Pt will ambulate entire 6 MWT test.   Baseline:  Goal status: not tested 6/11   5.  Pt will report at least a 25% improvement in thoracic and lumbar pain and sx's overall.  Goal status:  GOAL MET   6/3  Target date:  significant improvement after testing 6/11    LONG TERM GOALS: Target date: 09/30/2023  Pt will report at least a 70% improvement in her tolerance with daily activities and functional endurance with mobility.    Baseline:    Goal  status: 70% MET  6/3  2.  Pt will increase ambulation distance by at least 150 ft on 3 MWT.  Baseline:  Goal status: NOT MET  6/3  3.  Pt will report increased ambulation distance with reduced fatigue and SOB. Baseline:  Goal status: PROGRESSING  4.  Pt will perform 5x STS test in < than 16 sec for improved functional LE strength and performance of transfers.  Baseline:  Goal status: GOAL MET  3/27  5.  Pt will demo improved bilat LE strength to 4+/5 in hip flexion and 5/5 in knee extension and at least 6-8# increase in abd for improved performance of functional mobility and performance of household chores. Baseline:  Goal status: INITIAL  6.  Pt will report at least a 70% improvement in her thoracic and lumbar pain overall.    Goal status:  70% MET  6/3  Target date:  08/06/2023  7.  Pt will demo improved L shoulder flexion/abd AROM by 15/10 deg for improved UE elevation and reaching.   Goal status:  GOAL MET   6/3  Target date:  08/06/2023  8.  Pt will report she is able to use her L UE more with daily activities by at least 50% without significant pain.   Goal status:  ONGOING  Target date:  08/06/2023     PLAN:  PT FREQUENCY: 2x/week  PT DURATION: 6 Weeks  PLANNED INTERVENTIONS: 16109- PT Re-evaluation, 97110-Therapeutic exercises, 97530- Therapeutic activity, 97112-  Neuromuscular re-education, [id number]- Self Care, [id number]- Manual therapy, [id number]- Gait training, [phone]- Aquatic Therapy, [phone]- Ultrasound, Patient/Family education, Balance training, Stair training, Taping, Dry Needling, Cryotherapy, and Moist heat  PLAN FOR NEXT SESSION:  review/update HEP PRN.  Cont with gait, ambulation, balance, functional endurance, and  LE strengthening.  Monitoring activity tolerance via borg dyspnea scale and/or vitals. NO e-stim STM to medial scap mm and thoracic and lumbar paraspinals.  Rows with theraband.  Shoulder pulleys Cont with Aquatic therapy.  Assess response/tolerance.  dry  needling.  Trina Fujita III PT, DPT 08/27/23 10:44 AM

## 2023-08-27 ENCOUNTER — Encounter (HOSPITAL_BASED_OUTPATIENT_CLINIC_OR_DEPARTMENT_OTHER): Payer: Self-pay | Admitting: Physical Therapy

## 2023-09-01 ENCOUNTER — Encounter (HOSPITAL_BASED_OUTPATIENT_CLINIC_OR_DEPARTMENT_OTHER): Payer: Self-pay | Admitting: Physical Therapy

## 2023-09-01 ENCOUNTER — Ambulatory Visit (HOSPITAL_BASED_OUTPATIENT_CLINIC_OR_DEPARTMENT_OTHER): Payer: Self-pay | Admitting: Physical Therapy

## 2023-09-01 DIAGNOSIS — R293 Abnormal posture: Secondary | ICD-10-CM

## 2023-09-01 DIAGNOSIS — M6281 Muscle weakness (generalized): Secondary | ICD-10-CM | POA: Diagnosis not present

## 2023-09-01 DIAGNOSIS — M5459 Other low back pain: Secondary | ICD-10-CM

## 2023-09-01 DIAGNOSIS — M62830 Muscle spasm of back: Secondary | ICD-10-CM

## 2023-09-01 DIAGNOSIS — M546 Pain in thoracic spine: Secondary | ICD-10-CM

## 2023-09-01 DIAGNOSIS — R278 Other lack of coordination: Secondary | ICD-10-CM

## 2023-09-01 DIAGNOSIS — R262 Difficulty in walking, not elsewhere classified: Secondary | ICD-10-CM

## 2023-09-01 NOTE — Therapy (Signed)
 OUTPATIENT PHYSICAL THERAPY TREATMENT /progress note           Patient Name: Kristen Suarez MRN: [id number] DOB:1945-06-11, 78 y.o., female Today's Date: 09/02/2023  END OF SESSION:  PT End of Session - 09/01/23 1018     Visit Number 21    Number of Visits 30    Date for PT Re-Evaluation 09/30/23    Authorization Type MCR A and B    PT Start Time 1015    PT Stop Time 1056    PT Time Calculation (min) 41 min    Activity Tolerance Patient tolerated treatment well    Behavior During Therapy [facility] for tasks assessed/performed                        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/1976   Weight loss, unintentional 12/31/2022   Past Surgical History:  Procedure Laterality Date   ABDOMINAL HYSTERECTOMY  1987   BASAL CELL CARCINOMA EXCISION  06/30/2020   nose   CARDIAC CATHETERIZATION     CLIPPING OF ATRIAL APPENDAGE N/A 01/04/2022   Procedure: CLIPPING OF ATRIAL APPENDAGE USING SIZE 40 ATRICURE ATRIAL CLIP;  Surgeon: Eleanora Grew, MD;  Location: [facility] OR;  Service: Open Heart Surgery;  Laterality: N/A;   CORONARY ARTERY BYPASS GRAFT N/A 01/04/2022   Procedure: CORONARY ARTERY BYPASS GRAFTING (CABG) x  USING ENDOSCOPIC GREATER SAPHENOUS VEIN HARVEST;  Surgeon: Eleanora Grew, MD;  Location: [facility] OR;  Service: Open Heart Surgery;  Laterality: N/A;   LEFT HEART CATH AND CORONARY ANGIOGRAPHY N/A 01/02/2022   Procedure: LEFT HEART CATH AND CORONARY ANGIOGRAPHY;  Surgeon: Millicent Ally, MD;  Location: [facility] INVASIVE CV LAB;  Service: Cardiovascular;  Laterality:  N/A;   LUNG BIOPSY  06/16/1976   PERIPHERAL VASCULAR INTERVENTION Right 11/29/2020   Procedure: PERIPHERAL VASCULAR INTERVENTION;  Surgeon: Wenona Hamilton, MD;  Location: [facility] INVASIVE CV LAB;  Service: Cardiovascular;  Laterality: Right;  Renal Artery   RENAL ANGIOGRAPHY N/A 11/29/2020   Procedure: RENAL ANGIOGRAPHY;  Surgeon: Wenona Hamilton, MD;  Location: [facility] INVASIVE CV LAB;  Service: Cardiovascular;  Laterality: N/A;   TEE WITHOUT CARDIOVERSION N/A 01/04/2022   Procedure: TRANSESOPHAGEAL ECHOCARDIOGRAM (TEE);  Surgeon: Eleanora Grew, MD;  Location: [facility] OR;  Service: Open Heart Surgery;  Laterality: N/A;   Patient Active Problem List   Diagnosis Date Noted   PAF (paroxysmal atrial fibrillation) (HCC) 07/02/2023   Memory impairment 03/21/2023   Adjustment insomnia 08/19/2022   Nocturia more than twice per night 08/19/2022   Bilateral sensorineural hearing loss 06/27/2022   Hearing loss of right ear 02/04/2022   Hx of CABG 01/04/2022   Elevated coronary artery calcium  score    Chronic kidney disease, stage 3b (HCC) 01/01/2022   Syncope 12/31/2021   Renal artery stenosis (HCC) 09/25/2021   Aortic valve sclerosis 09/25/2021   Snoring 04/13/2021   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   Epistaxis 03/29/2021   Coronary artery  disease due to calcified coronary lesion 08/15/2020   Basal cell carcinoma 06/30/2020   History of loop recorder 12/13/2019   Allergic rhinitis 12/08/2019   Chronic cough 11/08/2019   Post-nasal drip 11/08/2019   Multiple joint pain 04/22/2019   Bilateral leg edema 04/22/2019   Vaginal atrophy 01/05/2019   Resting tremor 09/08/2018   Sarcoidosis 05/08/2018   HTN (hypertension) 05/08/2018   Colon polyp 05/08/2018   Family hx of colon cancer 05/08/2018   Cystocele, midline 05/08/2018   Hyperlipidemia 05/08/2018   Vasovagal syncope 05/08/2018   Seborrheic keratosis 05/08/2018   Pseudophakia of both eyes 05/08/2018   GERD (gastroesophageal reflux disease)  05/08/2018   COPD (chronic obstructive pulmonary disease) (HCC) 05/08/2018   Osteoporosis 12/31/2016   Progress Note Reporting Period  4/17 to 6/10  See note below for Objective Data and Assessment of Progress/Goals.      REFERRING PROVIDER:  Clearnce Curia, NP    Syliva Even, MD      REFERRING DIAG:  [phone] (ICD-10-CM) - Chronic bilateral low back pain without sciatica  M54.6,G89.29 (ICD-10-CM) - Chronic left-sided thoracic back pain   R06.09 (ICD-10-CM) - Exertional dyspnea              R53.81 (ICD-10-CM) - Physical deconditioning  THERAPY DIAG:  Muscle weakness (generalized)  Pain in thoracic spine  Other low back pain  Difficulty in walking, not elsewhere classified  Abnormal posture  Other lack of coordination  Muscle spasm of back  Rationale for Evaluation and Treatment: Rehabilitation  ONSET DATE: PT order 04/24/2023  SUBJECTIVE:   SUBJECTIVE STATEMENT: The patient reports just some tightness in her chest. She is otherwise doing well.   PERTINENT HISTORY: Chronic thoracic pain and lumbar pain CAD s/p CABG x 4 on 01/04/2022 Osteoporosis syncope, pulmonary sarcoidosis, implanted loop recorder Arthritis and HTN Memory problems since CABG surgery--she is being followed by neurology.    PAIN:  4-5/10 L scapular pain current, 10/10 worst, 1/10 best 0/10 lumbar ; 9-10/10 worst  Easing factors:  heating pad, tylenol  Aggravating factors:  Pt not sure what increases her sx's.   PRECAUTIONS: Other: Implanted loop recorder, osteoporosis, balance, CABG, syncope    WEIGHT BEARING RESTRICTIONS: No  FALLS:  Has patient fallen in last 6 months? Yes. Number of falls 1 on 02/23/2023, syncope  LIVING ENVIRONMENT: Lives with: lives with their spouse Lives in:  1 story home Stairs: 2-4 steps to enter/exit home with rail Has following equipment at home: Single point cane, Walker - 4 wheeled, and walking sticks  OCCUPATION: Pt is retired  PLOF:  Independent  PATIENT GOALS: to walk a straight line, to have improved activity tolerance after a short rest break, be more flexible    OBJECTIVE:  Note: Objective measures were completed at Evaluation unless otherwise noted.  DIAGNOSTIC FINDINGS:  Lumbar X rays on 3/4: FINDINGS: Five lumbar type vertebral bodies are well visualized. Vertebral body height is well maintained. Disc space narrowing is noted at L5-S1. No anterolisthesis is seen. No soft tissue abnormality is noted. Right renal artery stent is seen.   IMPRESSION: Degenerative change without acute abnormality.   CT chest on 3/5: IMPRESSION: *Stable extensive fibro atelectatic fibrocalcific changes of both lungs with significant bilateral bronchiectasis and scar changes involving particularly the upper and mid lung distributions with significant central pulmonary retraction and traction bronchiectasis and architectural distortion. These findings correlate with chronic interstitial lung disease and correlate with sarcoidosis stage IV. *No new significant findings. No acute infiltrates or consolidations. *  Stable multiple calcified mediastinal and hilar lymph nodes.Per MD note, Lumbar X-rays showed DDD worse L5-S1.  No acute fractures are visible.  LOWER EXTREMITY MMT:   MMT Right eval Left eval Right  6/10 Left 6/10  Hip flexion 4-/5 4/5 4/5 4+/5  Hip extension        Hip abduction 18.5 18.9    Hip adduction        Hip internal rotation        Hip external rotation        Knee flexion 5/5 4+/5 5 5   Knee extension 4/5 4-5 5 5   Ankle dorsiflexion 5/5 5/5 5 5   Ankle plantarflexion        Ankle inversion        Ankle eversion         (Blank rows = not tested)  Posture      FUNCTIONAL TESTS:  5x STS test: 24.05 without UE support 3 MWT:  529 ft.   Pt felt SOB at 1 min 48 sec.  O2:  99%, HR:  104  Walk test not performed today   6/10 Cervical ROM  Rotation 55 degrees right 60 degrees left     TREATMENT DATE:   6/16 Manual:  Skilled palpation of trigger points  Trigger poitn release to T-spaine and upper trap  Gross throacic PA mobilizations  [place]ors[place]and rib springing for thoracic  Neuro-re-ed  Cybex row machine 15 lbs 3x10  Cable Shoulder extension 5 3x10  With cuing for posture   Seated hip abduction red 3x10  Seated LAQ 2x10  With cuing to sit straight and for TA breathing     6/10  Manual:  Skilled palpation of trigger points  Trigger poitn release to T-spaine and upper trap  Gross throacic PA mobilizations  Corsshand rib springing for thoracic  There-ex:   Neuro-re-ed:  Bilateral ER yellow 2x10  Horizontal abduction 2x10 yellow Bilateral flexion yellow 2x10   Reviewed how to use RPE and progress exercises  Updated exercises  6/5  HR:  85 O2:  99%  Nustep x 3 mins at L3 UE/LE's HR:  91 O2:  97%  Pulleys in flexion and scaption 2x10 each Standing rows with RTB 2x10 Sidestepping  x 2 laps at [facility] without UE support Tandem gait with UE support on rail x 3 laps  O2:  97-99% HR: 94-96  Manual Therapy: Gentle STM to L medial scap mm seated   After Rx:  O2:  98-99% HR:  80                                                                                     PATIENT EDUCATION:  Education details:  PT answered pt's questions.  Exercise form, rationale of interventions, and POC.  Person educated: Patient Education method: Explanation, Demonstration, Tactile cues, Verbal cues Education comprehension: verbalized understanding, returned demonstration, verbal cues required, tactile cues required, and needs further education      HOME EXERCISE PROGRAM: Access Code: [email or web address] URL: [email or web address] Date: 05/29/2023 Prepared by: Mayme Spearman  Exercises - Standing Tandem Balance with Counter Support  - 2-3 x daily - 1-2 sets - 1-2 reps - 30sec  hold - Side Stepping with Counter Support  - 2-3 x daily - 1 sets - 8 reps -  Backward Walking with Counter Support  - 2-3 x daily - 1 sets - 8 reps   - Seated Scapular Retraction  - 2 x daily - 7 x weekly - 1-2 sets - 10 reps - Standing Shoulder Row with Anchored Resistance  - 1 x daily - 3-4 x weekly - 2 sets - 10 reps  ASSESSMENT:  CLINICAL IMPRESSION: The patient reportted mild tenderness in her chest following manual in prone and row machine. Therapy switched to LE exercises. We reviewed her home program. Overall she is making good progress. She has been going to the gym and riding the nu-step. Therapy worked on throacic mobility today. She reported some tenderness in her chest from lying prone. She tolerated gym exercises well. We kept her RPE at 4 today. She was advised to keep her RPE around moderate for now but we will progress when she sees how she tolerates it.    OBJECTIVE IMPAIRMENTS: decreased activity tolerance, decreased balance, decreased endurance, decreased mobility, difficulty walking, decreased strength, and pain.   ACTIVITY LIMITATIONS: standing, squatting, transfers, and locomotion level  PARTICIPATION LIMITATIONS: cleaning, shopping, and community activity  PERSONAL FACTORS: 3+ comorbidities: osteoporosis, CABG, arthritis, syncope, pulmonary sarcoidosis are also affecting patient's functional outcome.   REHAB POTENTIAL: Good  CLINICAL DECISION MAKING: Stable/uncomplicated  EVALUATION COMPLEXITY: Low   GOALS:  SHORT TERM GOALS: Target date: 06/11/2023  Pt will be independent and compliant with HEP for improved strength, tolerance to activity, functional endurance, pain, and mobility.   Baseline: 06/12/23: HEP ongoing Goal status: progressed HEP 6/11 2.  Pt will report at least a 25% improvement in tolerance to activity and her functional mobility.  Baseline:  06/12/23: reports 35-40% improvement, particularly with housework Goal status: MET  3.  Pt will perform 5x STS test in no > than 19 sec for improved functional LE strength and  performance of transfers.   Baseline:  06/12/23: 13.95sec no UE support Goal status: MET  4.  Pt will ambulate entire 6 MWT test.   Baseline:  Goal status: not tested 6/11   5.  Pt will report at least a 25% improvement in thoracic and lumbar pain and sx's overall.  Goal status:  GOAL MET   6/3  Target date:  significant improvement after testing 6/11    LONG TERM GOALS: Target date: 09/30/2023  Pt will report at least a 70% improvement in her tolerance with daily activities and functional endurance with mobility.    Baseline:    Goal status: 70% MET  6/3  2.  Pt will increase ambulation distance by at least 150 ft on 3 MWT.  Baseline:  Goal status: NOT MET  6/3  3.  Pt will report increased ambulation distance with reduced fatigue and SOB. Baseline:  Goal status: PROGRESSING  4.  Pt will perform 5x STS test in < than 16 sec for improved functional LE strength and performance of transfers.  Baseline:  Goal status: GOAL MET  3/27  5.  Pt will demo improved bilat LE strength to 4+/5 in hip flexion and 5/5 in knee extension and at least 6-8# increase in abd for improved performance of functional mobility and performance of household chores. Baseline:  Goal status: INITIAL  6.  Pt will report at least a 70% improvement in her thoracic and lumbar pain overall.    Goal status:  70% MET  6/3  Target date:  08/06/2023  7.  Pt will demo improved L shoulder flexion/abd AROM by 15/10 deg for improved UE elevation and reaching.   Goal status:  GOAL MET   6/3  Target date:  08/06/2023  8.  Pt will report she is able to use her L UE more with daily activities by at least 50% without significant pain.   Goal status:  ONGOING  Target date:  08/06/2023     PLAN:  PT FREQUENCY: 2x/week  PT DURATION: 6 Weeks  PLANNED INTERVENTIONS: 97164- PT Re-evaluation, 97110-Therapeutic exercises, 97530- Therapeutic activity, [id number]- Neuromuscular re-education, 97535- Self Care, [id number]- Manual  therapy, [id number]- Gait training, [phone]- Aquatic Therapy, [phone]- Ultrasound, Patient/Family education, Balance training, Stair training, Taping, Dry Needling, Cryotherapy, and Moist heat  PLAN FOR NEXT SESSION:  review/update HEP PRN.  Cont with gait, ambulation, balance, functional endurance, and  LE strengthening.  Monitoring activity tolerance via borg dyspnea scale and/or vitals. NO e-stim STM to medial scap mm and thoracic and lumbar paraspinals.  Rows with theraband.  Shoulder pulleys Cont with Aquatic therapy.  Assess response/tolerance.  dry needling.  Signa Drier PT DPT 09/02/23 7:50 AM

## 2023-09-02 ENCOUNTER — Encounter (HOSPITAL_BASED_OUTPATIENT_CLINIC_OR_DEPARTMENT_OTHER): Payer: Self-pay | Admitting: Physical Therapy

## 2023-09-03 ENCOUNTER — Ambulatory Visit (HOSPITAL_BASED_OUTPATIENT_CLINIC_OR_DEPARTMENT_OTHER): Payer: Medicare Other | Admitting: Cardiovascular Disease

## 2023-09-03 VITALS — BP 142/74 | HR 77 | Ht 62.0 in | Wt 128.2 lb

## 2023-09-03 DIAGNOSIS — E782 Mixed hyperlipidemia: Secondary | ICD-10-CM

## 2023-09-03 DIAGNOSIS — I2584 Coronary atherosclerosis due to calcified coronary lesion: Secondary | ICD-10-CM

## 2023-09-03 DIAGNOSIS — I2510 Atherosclerotic heart disease of native coronary artery without angina pectoris: Secondary | ICD-10-CM | POA: Diagnosis not present

## 2023-09-03 DIAGNOSIS — I358 Other nonrheumatic aortic valve disorders: Secondary | ICD-10-CM

## 2023-09-03 DIAGNOSIS — R61 Generalized hyperhidrosis: Secondary | ICD-10-CM

## 2023-09-03 DIAGNOSIS — I701 Atherosclerosis of renal artery: Secondary | ICD-10-CM | POA: Diagnosis not present

## 2023-09-03 DIAGNOSIS — I480 Paroxysmal atrial fibrillation: Secondary | ICD-10-CM

## 2023-09-03 DIAGNOSIS — I10 Essential (primary) hypertension: Secondary | ICD-10-CM | POA: Diagnosis not present

## 2023-09-03 DIAGNOSIS — R931 Abnormal findings on diagnostic imaging of heart and coronary circulation: Secondary | ICD-10-CM

## 2023-09-03 LAB — CBC WITH DIFFERENTIAL/PLATELET
Basophils Absolute: 0.1 10*3/uL (ref 0.0–0.2)
Basos: 1 %
EOS (ABSOLUTE): 0.1 10*3/uL (ref 0.0–0.4)
Eos: 1 %
Hematocrit: 42 % (ref 34.0–46.6)
Hemoglobin: 13.9 g/dL (ref 11.1–15.9)
Immature Grans (Abs): 0 10*3/uL (ref 0.0–0.1)
Immature Granulocytes: 0 %
Lymphocytes Absolute: 1.2 10*3/uL (ref 0.7–3.1)
Lymphs: 10 %
MCH: 30.1 pg (ref 26.6–33.0)
MCHC: 33.1 g/dL (ref 31.5–35.7)
MCV: 91 fL (ref 79–97)
Monocytes Absolute: 0.9 10*3/uL (ref 0.1–0.9)
Monocytes: 7 %
Neutrophils Absolute: 9.8 10*3/uL — ABNORMAL HIGH (ref 1.4–7.0)
Neutrophils: 81 %
Platelets: 327 10*3/uL (ref 150–450)
RBC: 4.62 x10E6/uL (ref 3.77–5.28)
RDW: 13.5 % (ref 11.7–15.4)
WBC: 12.2 10*3/uL — ABNORMAL HIGH (ref 3.4–10.8)

## 2023-09-03 NOTE — Patient Instructions (Signed)
 Medication Instructions:  Your physician recommends that you continue on your current medications as directed. Please refer to the Current Medication list given to you today.   Labwork: CBC TODAY   Testing/Procedures: NONE  Follow-Up: 6 MONTHS WITH DR Gillett, CAITLIN W NP, OR MICHELLE S NP   If you need a refill on your cardiac medications before your next appointment, please call your pharmacy.

## 2023-09-03 NOTE — Progress Notes (Signed)
 Advanced Hypertension Clinic Follow-up:    Date:  09/03/2023   ID:  Kristen Suarez, DOB 28-Mar-1945, MRN [id number]  PCP:  Suarez Kristen Rockford, NP  Cardiologist:  Annabella Scarce, MD   Referring MD: Suarez Kristen Rockford, NP   CC: Hypertension  History of Present Illness:    Kristen Suarez is a 78 y.o. female with a hx of coronary artery disease s/p CABG, hypertension, hyperlipidemia, renal artery stenosis s/p stenting, pulmonary sarcoidosis, and asthma here for follow-up. Kristen Suarez was initially seen in the [facility] 08/15/2020.  Kristen Suarez was first diagnosed with hypertension 20 years ago.  Kristen Suarez was in pulmonary rehab and the nurses were alarmed that her BP was running so high.  Her BP increased to 200 when walking.  It remained 140-160/70-80s.  Kristen Suarez had been working with Kristen Katheen, NP on her blood pressure control.  Most recently hydralazine  was added to her regimen of amlodipine  and irbesartan .  Her home blood pressure readings remained elevated though her office readings seem to have improved.  Kristen Suarez was referred to advanced hypertension clinic for further management.  Her BP hasn't changed since adding hydralazine .  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  Prior to this Kristen Suarez was exercising regularly. Kristen Suarez reported orthopnea which is chronic and Kristen Suarez attributes to her underlying lung disease.  Kristen Suarez did have COVID in 2021 and notes that Kristen Suarez had been more short of breath since that time.    Kristen Suarez underwent stenting of the right renal artery 11/29/2020. A few days later Kristen Suarez developed symptoms of pre-syncope and hypotension. Kristen Suarez presented to the ED 01/03/2021 following a syncopal episode during a chair yoga class. There was a prodrome of dizziness, and Kristen Suarez eased herself off of the chair prior to losing conciousness. Afterwards Kristen Suarez felt fatigue and nausea. Kristen Suarez decreased amlodipine  to 5 mg and Irbesartan  to 150 mg. Kristen Suarez followed up with Kristen Finder, NP on  01/22/2021 and was feeling well. Given her recurrent syncope, Kristen Suarez had an Echo 02/2021 which showed LVEF 60-65% with grade 1 diastolic dysfunction. Kristen Suarez had a small pericardial effusion. Kristen Suarez had carotid dopplers with minimal thickening bilaterally.    Kristen Suarez reported limitations of formal exercise due to health issues and becoming short winded quickly. Kristen Suarez complained of occasional epistaxis since her renal artery stent placement which Kristen Suarez attributed to Plavix . Kristen Suarez had taken her final dose of Plavix  2 days prior. Kristen Suarez was continued on amlodipine  and her irbesartan  was increased to 150 mg twice daily. Kristen Suarez followed up with Kristen Suarez 07/2021 and was doing well.  At her visit 09/2021 Kristen Suarez was transitioned to amlodipine /valsartan /HCTZ.  [name]  was added for HFpEF and dyspnea.  Kristen Suarez had a coronary CTA that showed severe CAD.  Kristen Suarez had an episode of syncope and was admitted 12/2021.  Her syncope was thought to be vagal in etiology.  Kristen Suarez had an ILR but the battery was at end-of-life.  Left heart cath revealed severe multivessel disease and Kristen Suarez underwent CABG ((LIMA - LAD, SVG - OM, SVG - PDA, SVG - Diag) with left atrial appendage clip on 12/2021.  Echo post procedurally revealed LVEF greater than 75% with trivial MR and a small PFO cannot be excluded.  Kristen Suarez had postoperative atrial fibrillation and was discharged on amiodarone .  Kristen Suarez saw Kristen Finder, NP 01/2022 and was still struggling with postoperative surgical discomfort. blood pressure was elevated in the office but controlled at home.  Lipids were uncontrolled and Repatha  was  added.  Kristen Suarez completed cardiac rehab and saw Dr. Murriel 05/2022.  Renal artery Dopplers 06/2022 showed that her stent was patent.  At her visit 07/2022 Kristen Suarez was feeling well overall.  Kristen Suarez did complain of fatigue with exertion.  Kristen Suarez discontinued [name]  due to intolerance.  However after following up with urogynecology they reported the [name]  likely was not the cause of her symptoms and said Kristen Suarez  could try again if needed.  Kristen Suarez struggled with exercise due to pain in her left side when Kristen Suarez tries to exercise.  This was thought to be postoperative musculoskeletal pain.  Kristen Suarez noted that her blood pressures tended to be well-controlled in the morning and higher in the evenings.  Kristen Suarez also noted fatigue so carvedilol  was reduced.  There was consideration for switching to nebivolol.  We attempted to limit diuretic use due to bladder concerns.  Kristen Suarez saw Kristen Finder, NP 08/2022 and blood pressures were better controlled.  They noted that Kristen Suarez had a history of mild OSA and recommended follow-up with pulmonary to consider whether treatment might help her energy levels.  Kristen Suarez saw Dr. Fernande and blood pressure was 136/84.  Carvedilol  was discontinued in the hopes that it would help her fatigue and he recommended allowing her blood pressure to be more loosely controlled as long as it stays under 150.  Kristen Suarez saw Kristen Suarez 12/2022 and he recommended repeating renal Dopplers 06/2023.  At her visit 02/2023 Kristen Suarez struggled with sleep difficulties and balance.  Blood pressure in the office was 171/82.  Losartan was added to her regimen.  Kristen Suarez follow-up with Kristen Finder, NP 04/2023 and continue to report fatigue.  Her sleep improved with trazodone .  Home blood pressure was averaging in the 120s to the 130s, though it was elevated in the office.  Kristen Suarez was referred to physical therapy for deconditioning.  Discussed the use of AI scribe software for clinical note transcription with the patient, who gave verbal consent to proceed.  History of Present Illness Kristen Suarez has experienced significant improvement in her condition over the past few weeks following a period of struggle post-surgery. Physical therapy has been ongoing since March, although it has been sporadic due to scheduling issues, and is expected to be completed in the next two weeks. An injection for back pain received about a week and a half ago has helped reduce her  previously constant pain. Kristen Suarez had been relying heavily on Tylenol  for pain management but has not taken any since the 14th.  Kristen Suarez has an ongoing respiratory issue for which Kristen Suarez is seeing a pulmonologist. A course of azithromycin , prednisone , and Tessalon  pearls resulted in a 95% improvement in her symptoms.  Her blood pressure at home has been stable, with readings in the 110s to 130s in the morning and 120s to 140s in the evening. Kristen Suarez takes amlodipine  at 6 AM, and metoprolol  and valsartan  at 6 PM.  Kristen Suarez is on rosuvastatin  and Repatha  for cholesterol management, with her LDL at 45 as of April. Kristen Suarez maintains a good diet and is working on her weight, which Kristen Suarez finds challenging to maintain at 125 pounds.  Kristen Suarez experiences night sweats since her surgery, which Kristen Suarez finds distressing but not severe. Kristen Suarez also reports sleep issues, with variable sleep duration ranging from six to eight hours.  Kristen Suarez experienced a vasovagal event in December. Kristen Suarez is well-hydrated and uses a Fitbit to monitor her activity. Kristen Suarez wants to engage in positive activities such as donating blood, which Kristen Suarez can do despite being  on aspirin .  Previous antihypertensives: carvedilol    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/1976   Weight loss, unintentional 12/31/2022    Past Surgical History:  Procedure Laterality Date   ABDOMINAL HYSTERECTOMY  1987   BASAL CELL CARCINOMA EXCISION  06/30/2020   nose   CARDIAC CATHETERIZATION     CLIPPING OF ATRIAL APPENDAGE N/A 01/04/2022   Procedure: CLIPPING OF ATRIAL APPENDAGE USING SIZE 40  ATRICURE ATRIAL CLIP;  Surgeon: Murriel Toribio DEL, MD;  Location: [facility] OR;  Service: Open Heart Surgery;  Laterality: N/A;   CORONARY ARTERY BYPASS GRAFT N/A 01/04/2022   Procedure: CORONARY ARTERY BYPASS GRAFTING (CABG) x  USING ENDOSCOPIC GREATER SAPHENOUS VEIN HARVEST;  Surgeon: Murriel Toribio DEL, MD;  Location: [facility] OR;  Service: Open Heart Surgery;  Laterality: N/A;   LEFT HEART CATH AND CORONARY ANGIOGRAPHY N/A 01/02/2022   Procedure: LEFT HEART CATH AND CORONARY ANGIOGRAPHY;  Surgeon: Burnard Debby LABOR, MD;  Location: [facility] INVASIVE CV LAB;  Service: Cardiovascular;  Laterality: N/A;   LUNG BIOPSY  06/16/1976   PERIPHERAL VASCULAR INTERVENTION Right 11/29/2020   Procedure: PERIPHERAL VASCULAR INTERVENTION;  Surgeon: Suarez Deatrice LABOR, MD;  Location: [facility] INVASIVE CV LAB;  Service: Cardiovascular;  Laterality: Right;  Renal Artery   RENAL ANGIOGRAPHY N/A 11/29/2020   Procedure: RENAL ANGIOGRAPHY;  Surgeon: Suarez Deatrice LABOR, MD;  Location: [facility] INVASIVE CV LAB;  Service: Cardiovascular;  Laterality: N/A;   TEE WITHOUT CARDIOVERSION N/A 01/04/2022   Procedure: TRANSESOPHAGEAL ECHOCARDIOGRAM (TEE);  Surgeon: Murriel Toribio DEL, MD;  Location: [facility] OR;  Service: Open Heart Surgery;  Laterality: N/A;    Current Medications: No outpatient medications have been marked as taking for the 09/03/23 encounter (Appointment) with Raford Riggs, MD.   Current Facility-Administered Medications for the 09/03/23 encounter (Appointment) with Raford Riggs, MD  Medication   [START ON 11/12/2023] denosumab  (PROLIA ) injection 60 mg     Allergies:   Keflex [cephalexin], Penicillins, and Sulfa antibiotics   Social History   Socioeconomic History   Marital status: Married    Spouse name: Not on file   Number of children: 3   Years of education: 18   Highest education level: Master's degree (e.g., MA, MS, MEng, MEd, MSW, MBA)  Occupational History   Occupation: retired  Tobacco Use   Smoking status: Never    Passive  exposure: Past   Smokeless tobacco: Never  Vaping Use   Vaping status: Never Used  Substance and Sexual Activity   Alcohol use: Not Currently    Comment: once in a while/rarely   Drug use: Never   Sexual activity: Not Currently  Other Topics Concern   Not on file  Social History Narrative   Right handed    No caffeine   One level   Retired Comptroller   Lives with husband   Social Drivers of Corporate investment banker Strain: Low Risk  (06/28/2023)   Overall Financial Resource Strain (CARDIA)    Difficulty of Paying Living Expenses: Not  hard at all  Food Insecurity: No Food Insecurity (06/28/2023)   Hunger Vital Sign    Worried About Running Out of Food in the Last Year: Never true    Ran Out of Food in the Last Year: Never true  Transportation Needs: No Transportation Needs (06/28/2023)   PRAPARE - Administrator, Civil Service (Medical): No    Lack of Transportation (Non-Medical): No  Physical Activity: Sufficiently Active (06/28/2023)   Exercise Vital Sign    Days of Exercise per Week: 4 days    Minutes of Exercise per Session: 40 min  Stress: No Stress Concern Present (06/28/2023)   [employer] of Occupational Health - Occupational Stress Questionnaire    Feeling of Stress : Not at all  Social Connections: Moderately Isolated (06/28/2023)   Social Connection and Isolation Panel    Frequency of Communication with Friends and Family: Twice a week    Frequency of Social Gatherings with Friends and Family: Never    Attends Religious Services: More than 4 times per year    Active Member of [employer] or Organizations: No    Attends Engineer, structural: Never    Marital Status: Married     Family History: The patient's family history includes Alcohol abuse in her brother, brother, father, and sister; Arthritis in her mother; Cancer in her mother; Depression in her mother; Early death in her brother; Heart attack in her brother and mother; Heart disease in  her brother, brother, father, maternal grandfather, paternal grandfather, and sister; Hyperlipidemia in her brother, brother, father, mother, sister, sister, and son; Hypertension in her brother, father, mother, and sister; Multiple sclerosis in her daughter; Stroke in her sister.  ROS:   Please see the history of present illness.    (+) Chronic back pain (+) Muscle cramping of bilateral hands and LE  (+) Right knee weakness All other systems reviewed and are negative.  EKGs/Labs/Other Studies Reviewed:    Renal Artery Doppler  06/19/2021: Summary:  Largest Aortic Diameter: 2.0 cm     Renal:     Right: No evidence of right renal artery stenosis, s/p renal artery         stenting. RRV flow present. Normal size right kidney. Normal         right Resisitive Index. Normal cortical thickness of right         kidney.  Left:  Known left renal artery occlusion.  Mesenteric:  Normal Celiac artery and Superior Mesenteric artery findings.  Patent IVC.   Blood pressure monitor 06/06/2021: 24 Hour Blood Pressure Monitoring   Overall Average BP:  147/73 Overall Average Heart Rate: 78 Awake Average BP: 149/79, Heart Rate 79 Asleep Average BP: 143/70, Heart Rate 74   87% SBP >140 mmHg  Bilateral Carotid Duplex 02/16/2021: Summary:  Right Carotid: The extracranial vessels were near-normal with only minimal wall thickening or plaque.   Left Carotid: The extracranial vessels were near-normal with only minimal  wall thickening or plaque.   Vertebrals:  Bilateral vertebral arteries demonstrate antegrade flow.  Subclavians: Normal flow hemodynamics were seen in bilateral subclavian arteries.   Echo 02/16/2021:  1. There is a mid LV cavitary dynamic gradient due to hyperdynamic LVF.  Left ventricular ejection fraction, by estimation, is 60 to 65%. Left  ventricular ejection fraction by 3D volume is 64 %. The left ventricle has  normal function. The left  ventricle has no regional wall motion  abnormalities. Left ventricular  diastolic parameters are consistent with  Grade I diastolic dysfunction  (impaired relaxation). The average left ventricular global longitudinal  strain is -16.8 %. The global  longitudinal strain is normal.   2. Right ventricular systolic function is normal. The right ventricular  size is normal. There is normal pulmonary artery systolic pressure. The  estimated right ventricular systolic pressure is 9.1 mmHg.   3. The pericardial effusion is anterior to the right ventricle.   4. The mitral valve is normal in structure. No evidence of mitral valve  regurgitation. No evidence of mitral stenosis.   5. The aortic valve is calcified. Aortic valve regurgitation is not  visualized. Aortic valve sclerosis/calcification is present, without any  evidence of aortic stenosis. Aortic valve area, by VTI measures 1.88 cm.  Aortic valve mean gradient measures  5.3 mmHg. Aortic valve Vmax measures 1.64 m/s.   6. The inferior vena cava is normal in size with greater than 50%  respiratory variability, suggesting right atrial pressure of 3 mmHg.   Comparison(s): EF 60%, mild MAC, moderate aortic sclerosis with no  evidence of stenosis, IAS lipomatous.   12/29/2020 CT Chest new small pericardial effusion noted.   CT Chest 12/29/2020:   IMPRESSION: 1. Mediastinal, hilar and pulmonary parenchymal findings of sarcoid, unchanged from 12/28/2019. 2. New small pericardial effusion. 3. Aortic atherosclerosis (ICD10-I70.0). Coronary artery calcification. 4.  Emphysema (ICD10-J43.9).  Renal Angiography 11/29/2020: 1.  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 2.  Successful angioplasty and stent placement to the right renal artery.   Recommendations: Dual antiplatelet therapy for few months. Monitor blood pressure closely and adjust antihypertensive medications as needed.  EKG:  EKG is personally reviewed. 09/25/2021:   EKG was not ordered. 03/29/2021: EKG was not ordered. 08/15/2020: sinus rhythm rate 85 bpm  Recent Labs: 12/31/2022: TSH 2.030 07/03/2023: ALT 19; BUN 20; Creatinine, Ser 0.91; Potassium 3.7; Sodium 140   Recent Lipid Panel    Component Value Date/Time   CHOL 119 07/03/2023 0819   CHOL 119 07/23/2022 0819   TRIG 75.0 07/03/2023 0819   HDL 58.90 07/03/2023 0819   HDL 46 07/23/2022 0819   CHOLHDL 2 07/03/2023 0819   VLDL 15.0 07/03/2023 0819   LDLCALC 45 07/03/2023 0819   LDLCALC 54 07/23/2022 0819    Physical Exam:    VS:  There were no vitals taken for this visit. , BMI There is no height or weight on file to calculate BMI. GENERAL:  Well appearing HEENT: Pupils equal round and reactive, fundi not visualized, oral mucosa unremarkable NECK:  No jugular venous distention, waveform within normal limits, carotid upstroke brisk and symmetric, no bruits LUNGS:  CTAB.  No crackles, rhonchi or wheezes.  HEART:  RRR.  PMI not displaced or sustained,S1 and S2 within normal limits, no S3, no S4, no clicks, no rubs, II/VI systolic murmur at the LUSB. ABD:  Flat, positive bowel sounds normal in frequency in pitch, no bruits, no rebound, no guarding, no midline pulsatile mass, no hepatomegaly, no splenomegaly EXT:  2 plus pulses throughout, no edema, no cyanosis no clubbing SKIN:  No rashes no nodules NEURO:  Cranial nerves II through XII grossly intact, motor grossly intact throughout PSYCH:  Cognitively intact, oriented to person place and time   ASSESSMENT/PLAN:    Assessment & Plan # Hypertension Blood pressure controlled with current regimen. - Refill metoprolol  prescription. - Continue amlodipine , valsartan  and metoprolol .,   # Hyperlipidemia LDL controlled at 45 mg/dL with rosuvastatin  and Repatha . Discussed potential  rosuvastatin  reduction with further LDL decrease. - Continue rosuvastatin  and Repatha . - Monitor cholesterol levels and consider reducing rosuvastatin  if LDL  decreases further.  # Vasovagal syncope Recent event in December. Discussed symptom recognition and preventive measures. - Stay hydrated. - Recognize early symptoms and lie down with feet elevated when symptoms occur. - Wear compression socks during the day  # Swelling in legs Swelling in afternoon and evening. Discussed compression socks use. - Wear compression socks during the day. - Remove compression socks at night.  # Back pain Chronic pain improved post-injection. Plans to complete physical therapy. - Continue physical therapy for two more weeks.  # Sleep issues Ongoing sleep issues. Discussed follow-up with primary care. - Follow up with primary care provider for sleep issues.  # Night sweats Persistent since surgery. Normal thyroid  tests. Plan to rule out hematological causes. - Order CBC to evaluate for potential hematological causes of night sweats. - Follow up with primary care provider for further evaluation if needed.    Screening for Secondary Hypertension:     08/15/2020   10:31 AM  Causes  Drugs/Herbals Screened     - Comments no issues  Renovascular HTN Screened     - Comments check renal Dopplers  Sleep Apnea Screened     - Comments no triggers    Relevant Labs/Studies:    Latest Ref Rng & Units 07/03/2023    8:19 AM 02/28/2023    9:59 AM 07/23/2022    8:19 AM  Basic Labs  Sodium 135 - 145 mEq/L 140  142  140   Potassium 3.5 - 5.1 mEq/L 3.7  4.3  4.0   Creatinine 0.40 - 1.20 mg/dL 9.08  8.97  9.15        Latest Ref Rng & Units 12/31/2022   12:00 AM 07/30/2022   11:16 AM  Thyroid    TSH 0.450 - 4.500 uIU/mL 2.030  1.660                 08/08/2023   10:02 AM  Renovascular   Renal Artery US  Completed Yes    Medication Adjustments/Labs and Tests Ordered: Current medicines are reviewed at length with the patient today.  Concerns regarding medicines are outlined above.   No orders of the defined types were placed in this encounter.  No  orders of the defined types were placed in this encounter.    Signed, Annabella Scarce, MD  09/03/2023 9:46 AM    [facility]

## 2023-09-04 ENCOUNTER — Ambulatory Visit (HOSPITAL_BASED_OUTPATIENT_CLINIC_OR_DEPARTMENT_OTHER): Admitting: Physical Therapy

## 2023-09-04 ENCOUNTER — Encounter (HOSPITAL_BASED_OUTPATIENT_CLINIC_OR_DEPARTMENT_OTHER): Payer: Self-pay | Admitting: Physical Therapy

## 2023-09-04 DIAGNOSIS — M6281 Muscle weakness (generalized): Secondary | ICD-10-CM | POA: Diagnosis not present

## 2023-09-04 DIAGNOSIS — M546 Pain in thoracic spine: Secondary | ICD-10-CM

## 2023-09-04 DIAGNOSIS — R262 Difficulty in walking, not elsewhere classified: Secondary | ICD-10-CM

## 2023-09-04 DIAGNOSIS — M5459 Other low back pain: Secondary | ICD-10-CM

## 2023-09-04 NOTE — Therapy (Signed)
 OUTPATIENT PHYSICAL THERAPY TREATMENT /progress note           Patient Name: Kristen Suarez MRN: [id number] DOB:Jun 13, 1945, 78 y.o., female Today's Date: 09/04/2023  END OF SESSION:  PT End of Session - 09/04/23 0914     Visit Number 22    Number of Visits 30    Date for PT Re-Evaluation 09/30/23    Authorization Type MCR A and B    PT Start Time 0845    PT Stop Time 0928    PT Time Calculation (min) 43 min    Activity Tolerance Patient tolerated treatment well    Behavior During Therapy [facility] for tasks assessed/performed                         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/1976   Weight loss, unintentional 12/31/2022   Past Surgical History:  Procedure Laterality Date   ABDOMINAL HYSTERECTOMY  1987   BASAL CELL CARCINOMA EXCISION  06/30/2020   nose   CARDIAC CATHETERIZATION     CLIPPING OF ATRIAL APPENDAGE N/A 01/04/2022   Procedure: CLIPPING OF ATRIAL APPENDAGE USING SIZE 40 ATRICURE ATRIAL CLIP;  Surgeon: Eleanora Grew, MD;  Location: [facility] OR;  Service: Open Heart Surgery;  Laterality: N/A;   CORONARY ARTERY BYPASS GRAFT N/A 01/04/2022   Procedure: CORONARY ARTERY BYPASS GRAFTING (CABG) x  USING ENDOSCOPIC GREATER SAPHENOUS VEIN HARVEST;  Surgeon: Eleanora Grew, MD;  Location: [facility] OR;  Service: Open Heart Surgery;  Laterality: N/A;   LEFT HEART CATH AND CORONARY ANGIOGRAPHY N/A 01/02/2022   Procedure: LEFT HEART CATH AND CORONARY ANGIOGRAPHY;  Surgeon: Millicent Ally, MD;  Location: [facility] INVASIVE CV LAB;  Service: Cardiovascular;   Laterality: N/A;   LUNG BIOPSY  06/16/1976   PERIPHERAL VASCULAR INTERVENTION Right 11/29/2020   Procedure: PERIPHERAL VASCULAR INTERVENTION;  Surgeon: Wenona Hamilton, MD;  Location: [facility] INVASIVE CV LAB;  Service: Cardiovascular;  Laterality: Right;  Renal Artery   RENAL ANGIOGRAPHY N/A 11/29/2020   Procedure: RENAL ANGIOGRAPHY;  Surgeon: Wenona Hamilton, MD;  Location: [facility] INVASIVE CV LAB;  Service: Cardiovascular;  Laterality: N/A;   TEE WITHOUT CARDIOVERSION N/A 01/04/2022   Procedure: TRANSESOPHAGEAL ECHOCARDIOGRAM (TEE);  Surgeon: Eleanora Grew, MD;  Location: [facility] OR;  Service: Open Heart Surgery;  Laterality: N/A;   Patient Active Problem List   Diagnosis Date Noted   PAF (paroxysmal atrial fibrillation) (HCC) 07/02/2023   Memory impairment 03/21/2023   Adjustment insomnia 08/19/2022   Nocturia more than twice per night 08/19/2022   Bilateral sensorineural hearing loss 06/27/2022   Hearing loss of right ear 02/04/2022   Hx of CABG 01/04/2022   Elevated coronary artery calcium  score    Chronic kidney disease, stage 3b (HCC) 01/01/2022   Syncope 12/31/2021   Renal artery stenosis (HCC) 09/25/2021   Aortic valve sclerosis 09/25/2021   Snoring 04/13/2021   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   Epistaxis 03/29/2021   Coronary  artery disease due to calcified coronary lesion 08/15/2020   Basal cell carcinoma 06/30/2020   History of loop recorder 12/13/2019   Allergic rhinitis 12/08/2019   Chronic cough 11/08/2019   Post-nasal drip 11/08/2019   Multiple joint pain 04/22/2019   Bilateral leg edema 04/22/2019   Vaginal atrophy 01/05/2019   Resting tremor 09/08/2018   Sarcoidosis 05/08/2018   White coat syndrome with diagnosis of hypertension 05/08/2018   Colon polyp 05/08/2018   Family hx of colon cancer 05/08/2018   Cystocele, midline 05/08/2018   Hyperlipidemia 05/08/2018   Vasovagal syncope 05/08/2018   Seborrheic keratosis 05/08/2018   Pseudophakia of both eyes 05/08/2018    GERD (gastroesophageal reflux disease) 05/08/2018   COPD (chronic obstructive pulmonary disease) (HCC) 05/08/2018   Osteoporosis 12/31/2016   Progress Note Reporting Period  4/17 to 6/10  See note below for Objective Data and Assessment of Progress/Goals.      REFERRING PROVIDER:  Clearnce Curia, NP    Syliva Even, MD      REFERRING DIAG:  [id number] (ICD-10-CM) - Chronic bilateral low back pain without sciatica  M54.6,G89.29 (ICD-10-CM) - Chronic left-sided thoracic back pain   R06.09 (ICD-10-CM) - Exertional dyspnea              R53.81 (ICD-10-CM) - Physical deconditioning  THERAPY DIAG:  Muscle weakness (generalized)  Pain in thoracic spine  Other low back pain  Difficulty in walking, not elsewhere classified  Rationale for Evaluation and Treatment: Rehabilitation  ONSET DATE: PT order 04/24/2023  SUBJECTIVE:   SUBJECTIVE STATEMENT: The patient reports just some tightness in her chest. She is otherwise doing well.   PERTINENT HISTORY: Chronic thoracic pain and lumbar pain CAD s/p CABG x 4 on 01/04/2022 Osteoporosis syncope, pulmonary sarcoidosis, implanted loop recorder Arthritis and HTN Memory problems since CABG surgery--she is being followed by neurology.    PAIN:  4-5/10 L scapular pain current, 10/10 worst, 1/10 best 0/10 lumbar ; 9-10/10 worst  Easing factors:  heating pad, tylenol  Aggravating factors:  Pt not sure what increases her sx's.   PRECAUTIONS: Other: Implanted loop recorder, osteoporosis, balance, CABG, syncope    WEIGHT BEARING RESTRICTIONS: No  FALLS:  Has patient fallen in last 6 months? Yes. Number of falls 1 on 02/23/2023, syncope  LIVING ENVIRONMENT: Lives with: lives with their spouse Lives in:  1 story home Stairs: 2-4 steps to enter/exit home with rail Has following equipment at home: Single point cane, Walker - 4 wheeled, and walking sticks  OCCUPATION: Pt is retired  PLOF: Independent  PATIENT GOALS: to  walk a straight line, to have improved activity tolerance after a short rest break, be more flexible    OBJECTIVE:  Note: Objective measures were completed at Evaluation unless otherwise noted.  DIAGNOSTIC FINDINGS:  Lumbar X rays on 3/4: FINDINGS: Five lumbar type vertebral bodies are well visualized. Vertebral body height is well maintained. Disc space narrowing is noted at L5-S1. No anterolisthesis is seen. No soft tissue abnormality is noted. Right renal artery stent is seen.   IMPRESSION: Degenerative change without acute abnormality.   CT chest on 3/5: IMPRESSION: *Stable extensive fibro atelectatic fibrocalcific changes of both lungs with significant bilateral bronchiectasis and scar changes involving particularly the upper and mid lung distributions with significant central pulmonary retraction and traction bronchiectasis and architectural distortion. These findings correlate with chronic interstitial lung disease and correlate with sarcoidosis stage IV. *No new significant findings. No acute infiltrates or consolidations. *Stable multiple calcified mediastinal and hilar lymph  nodes.Per MD note, Lumbar X-rays showed DDD worse L5-S1.  No acute fractures are visible.  LOWER EXTREMITY MMT:   MMT Right eval Left eval Right  6/10 Left 6/10  Hip flexion 4-/5 4/5 4/5 4+/5  Hip extension        Hip abduction 18.5 18.9    Hip adduction        Hip internal rotation        Hip external rotation        Knee flexion 5/5 4+/5 5 5   Knee extension 4/5 4-5 5 5   Ankle dorsiflexion 5/5 5/5 5 5   Ankle plantarflexion        Ankle inversion        Ankle eversion         (Blank rows = not tested)  Posture      FUNCTIONAL TESTS:  5x STS test: 24.05 without UE support 3 MWT:  529 ft.   Pt felt SOB at 1 min 48 sec.  O2:  99%, HR:  104  Walk test not performed today   6/10 Cervical ROM  Rotation 55 degrees right 60 degrees left    TREATMENT DATE:  6/19 Manual: Skilled  palpation of trigger points  Trigger poitn release to T-spaine and upper trap  Gross throacic PA mobilizations  All done in sitting  today.   Neuro-re-ed:  Triceps press down 2x10 20 lbs with cuing for abdominal brace   There-ex:   Cybex leg press 40 lbs Seat back 4 Seat 5 3x10 LF knee extension feet M 10 lbs 3x10     6/16 Manual:  Skilled palpation of trigger points  Trigger poitn release to T-spaine and upper trap  Gross throacic PA mobilizations  [place] rib springing for thoracic  Neuro-re-ed  Cybex row machine 15 lbs 3x10  Cable Shoulder extension 5 3x10  With cuing for posture   Seated hip abduction red 3x10  Seated LAQ 2x10  With cuing to sit straight and for TA breathing     6/10  Manual:  Skilled palpation of trigger points  Trigger poitn release to T-spaine and upper trap  Gross throacic PA mobilizations  [place] rib springing for thoracic  There-ex:   Neuro-re-ed:  Bilateral ER yellow 2x10  Horizontal abduction 2x10 yellow Bilateral flexion yellow 2x10   Reviewed how to use RPE and progress exercises  Updated exercises  6/5  HR:  85 O2:  99%  Nustep x 3 mins at L3 UE/LE's HR:  91 O2:  97%  Pulleys in flexion and scaption 2x10 each Standing rows with RTB 2x10 Sidestepping  x 2 laps at [facility] without UE support Tandem gait with UE support on rail x 3 laps  O2:  97-99% HR: 94-96  Manual Therapy: Gentle STM to L medial scap mm seated   After Rx:  O2:  98-99% HR:  80                                                                                     PATIENT EDUCATION:  Education details:  PT answered pt's questions.  Exercise form, rationale of interventions, and POC.  Person educated: Patient Education method:  Explanation, Demonstration, Tactile cues, Verbal cues Education comprehension: verbalized understanding, returned demonstration, verbal cues required, tactile cues required, and needs further education      HOME  EXERCISE PROGRAM: Access Code: [email or web address] URL: [email or web address] Date: 05/29/2023 Prepared by: Mayme Spearman  Exercises - Standing Tandem Balance with Counter Support  - 2-3 x daily - 1-2 sets - 1-2 reps - 30sec hold - Side Stepping with Counter Support  - 2-3 x daily - 1 sets - 8 reps - Backward Walking with Counter Support  - 2-3 x daily - 1 sets - 8 reps   - Seated Scapular Retraction  - 2 x daily - 7 x weekly - 1-2 sets - 10 reps - Standing Shoulder Row with Anchored Resistance  - 1 x daily - 3-4 x weekly - 2 sets - 10 reps  ASSESSMENT:  CLINICAL IMPRESSION: The patient tolerated treatment well today. We did our manual therapy from a seated position which caused less tenderness in her chest. We continued to expand her gym exercises. She hopes to discharge next visit to a home/gym program. She was given her weihts for certain machines. She was shown how to set them up. She was advised to use the health and wellness specialists as well.   OBJECTIVE IMPAIRMENTS: decreased activity tolerance, decreased balance, decreased endurance, decreased mobility, difficulty walking, decreased strength, and pain.   ACTIVITY LIMITATIONS: standing, squatting, transfers, and locomotion level  PARTICIPATION LIMITATIONS: cleaning, shopping, and community activity  PERSONAL FACTORS: 3+ comorbidities: osteoporosis, CABG, arthritis, syncope, pulmonary sarcoidosis are also affecting patient's functional outcome.   REHAB POTENTIAL: Good  CLINICAL DECISION MAKING: Stable/uncomplicated  EVALUATION COMPLEXITY: Low   GOALS:  SHORT TERM GOALS: Target date: 06/11/2023  Pt will be independent and compliant with HEP for improved strength, tolerance to activity, functional endurance, pain, and mobility.   Baseline: 06/12/23: HEP ongoing Goal status: progressed HEP 6/11 2.  Pt will report at least a 25% improvement in tolerance to activity and her functional mobility.  Baseline:  06/12/23:  reports 35-40% improvement, particularly with housework Goal status: MET  3.  Pt will perform 5x STS test in no > than 19 sec for improved functional LE strength and performance of transfers.   Baseline:  06/12/23: 13.95sec no UE support Goal status: MET  4.  Pt will ambulate entire 6 MWT test.   Baseline:  Goal status: not tested 6/11   5.  Pt will report at least a 25% improvement in thoracic and lumbar pain and sx's overall.  Goal status:  GOAL MET   6/3  Target date:  significant improvement after testing 6/11    LONG TERM GOALS: Target date: 09/30/2023  Pt will report at least a 70% improvement in her tolerance with daily activities and functional endurance with mobility.    Baseline:    Goal status: 70% MET  6/3  2.  Pt will increase ambulation distance by at least 150 ft on 3 MWT.  Baseline:  Goal status: NOT MET  6/3  3.  Pt will report increased ambulation distance with reduced fatigue and SOB. Baseline:  Goal status: PROGRESSING  4.  Pt will perform 5x STS test in < than 16 sec for improved functional LE strength and performance of transfers.  Baseline:  Goal status: GOAL MET  3/27  5.  Pt will demo improved bilat LE strength to 4+/5 in hip flexion and 5/5 in knee extension and at least 6-8# increase in abd for improved performance of functional mobility and  performance of household chores. Baseline:  Goal status: INITIAL  6.  Pt will report at least a 70% improvement in her thoracic and lumbar pain overall.    Goal status:  70% MET  6/3  Target date:  08/06/2023  7.  Pt will demo improved L shoulder flexion/abd AROM by 15/10 deg for improved UE elevation and reaching.   Goal status:  GOAL MET   6/3  Target date:  08/06/2023  8.  Pt will report she is able to use her L UE more with daily activities by at least 50% without significant pain.   Goal status:  ONGOING  Target date:  08/06/2023     PLAN:  PT FREQUENCY: 2x/week  PT DURATION: 6 Weeks  PLANNED  INTERVENTIONS: 97164- PT Re-evaluation, 97110-Therapeutic exercises, 97530- Therapeutic activity, [id number]- Neuromuscular re-education, 97535- Self Care, 96045- Manual therapy, [id number]- Gait training, [phone]- Aquatic Therapy, [phone]- Ultrasound, Patient/Family education, Balance training, Stair training, Taping, Dry Needling, Cryotherapy, and Moist heat  PLAN FOR NEXT SESSION:  review/update HEP PRN.  Cont with gait, ambulation, balance, functional endurance, and  LE strengthening.  Monitoring activity tolerance via borg dyspnea scale and/or vitals. NO e-stim STM to medial scap mm and thoracic and lumbar paraspinals.  Rows with theraband.  Shoulder pulleys Cont with Aquatic therapy.  Assess response/tolerance.  dry needling.  Signa Drier PT DPT 09/04/23 12:28 PM

## 2023-09-06 ENCOUNTER — Ambulatory Visit: Payer: Self-pay | Admitting: Cardiovascular Disease

## 2023-09-11 ENCOUNTER — Encounter (HOSPITAL_BASED_OUTPATIENT_CLINIC_OR_DEPARTMENT_OTHER): Payer: Self-pay | Admitting: Physical Therapy

## 2023-09-11 ENCOUNTER — Ambulatory Visit (HOSPITAL_BASED_OUTPATIENT_CLINIC_OR_DEPARTMENT_OTHER): Payer: Self-pay | Admitting: Physical Therapy

## 2023-09-11 DIAGNOSIS — M6281 Muscle weakness (generalized): Secondary | ICD-10-CM

## 2023-09-11 DIAGNOSIS — R293 Abnormal posture: Secondary | ICD-10-CM

## 2023-09-11 DIAGNOSIS — M62830 Muscle spasm of back: Secondary | ICD-10-CM

## 2023-09-11 DIAGNOSIS — M546 Pain in thoracic spine: Secondary | ICD-10-CM

## 2023-09-11 DIAGNOSIS — M5459 Other low back pain: Secondary | ICD-10-CM

## 2023-09-11 DIAGNOSIS — R278 Other lack of coordination: Secondary | ICD-10-CM

## 2023-09-11 DIAGNOSIS — R262 Difficulty in walking, not elsewhere classified: Secondary | ICD-10-CM

## 2023-09-12 NOTE — Therapy (Signed)
 OUTPATIENT PHYSICAL THERAPY TREATMENT /progress note           Patient Name: Kristen Suarez MRN: [id number] DOB:Dec 10, 1945, 78 y.o., female Today's Date: 09/12/2023  END OF SESSION:  PT End of Session - 09/11/23 2201     Visit Number 23    Number of Visits 30    Date for PT Re-Evaluation 09/30/23    Authorization Type MCR A and B    PT Start Time 1230    PT Stop Time 1312    PT Time Calculation (min) 42 min    Activity Tolerance Patient tolerated treatment well    Behavior During Therapy [facility] for tasks assessed/performed                         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/1976   Weight loss, unintentional 12/31/2022   Past Surgical History:  Procedure Laterality Date   ABDOMINAL HYSTERECTOMY  1987   BASAL CELL CARCINOMA EXCISION  06/30/2020   nose   CARDIAC CATHETERIZATION     CLIPPING OF ATRIAL APPENDAGE N/A 01/04/2022   Procedure: CLIPPING OF ATRIAL APPENDAGE USING SIZE 40 ATRICURE ATRIAL CLIP;  Surgeon: Murriel Toribio DEL, MD;  Location: [facility] OR;  Service: Open Heart Surgery;  Laterality: N/A;   CORONARY ARTERY BYPASS GRAFT N/A 01/04/2022   Procedure: CORONARY ARTERY BYPASS GRAFTING (CABG) x  USING ENDOSCOPIC GREATER SAPHENOUS VEIN HARVEST;  Surgeon: Murriel Toribio DEL, MD;  Location: [facility] OR;  Service: Open Heart Surgery;  Laterality: N/A;   LEFT HEART CATH AND CORONARY ANGIOGRAPHY N/A 01/02/2022   Procedure: LEFT HEART CATH AND CORONARY ANGIOGRAPHY;  Surgeon: Burnard Debby LABOR, MD;  Location: [facility] INVASIVE CV LAB;  Service: Cardiovascular;   Laterality: N/A;   LUNG BIOPSY  06/16/1976   PERIPHERAL VASCULAR INTERVENTION Right 11/29/2020   Procedure: PERIPHERAL VASCULAR INTERVENTION;  Surgeon: Darron Deatrice LABOR, MD;  Location: [facility] INVASIVE CV LAB;  Service: Cardiovascular;  Laterality: Right;  Renal Artery   RENAL ANGIOGRAPHY N/A 11/29/2020   Procedure: RENAL ANGIOGRAPHY;  Surgeon: Darron Deatrice LABOR, MD;  Location: [facility] INVASIVE CV LAB;  Service: Cardiovascular;  Laterality: N/A;   TEE WITHOUT CARDIOVERSION N/A 01/04/2022   Procedure: TRANSESOPHAGEAL ECHOCARDIOGRAM (TEE);  Surgeon: Murriel Toribio DEL, MD;  Location: [facility] OR;  Service: Open Heart Surgery;  Laterality: N/A;   Patient Active Problem List   Diagnosis Date Noted   PAF (paroxysmal atrial fibrillation) (HCC) 07/02/2023   Memory impairment 03/21/2023   Adjustment insomnia 08/19/2022   Nocturia more than twice per night 08/19/2022   Bilateral sensorineural hearing loss 06/27/2022   Hearing loss of right ear 02/04/2022   Hx of CABG 01/04/2022   Elevated coronary artery calcium  score    Chronic kidney disease, stage 3b (HCC) 01/01/2022   Syncope 12/31/2021   Renal artery stenosis (HCC) 09/25/2021   Aortic valve sclerosis 09/25/2021   Snoring 04/13/2021   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   Epistaxis 03/29/2021   Coronary  artery disease due to calcified coronary lesion 08/15/2020   Basal cell carcinoma 06/30/2020   History of loop recorder 12/13/2019   Allergic rhinitis 12/08/2019   Chronic cough 11/08/2019   Post-nasal drip 11/08/2019   Multiple joint pain 04/22/2019   Bilateral leg edema 04/22/2019   Vaginal atrophy 01/05/2019   Resting tremor 09/08/2018   Sarcoidosis 05/08/2018   White coat syndrome with diagnosis of hypertension 05/08/2018   Colon polyp 05/08/2018   Family hx of colon cancer 05/08/2018   Cystocele, midline 05/08/2018   Hyperlipidemia 05/08/2018   Vasovagal syncope 05/08/2018   Seborrheic keratosis 05/08/2018   Pseudophakia of both eyes 05/08/2018    GERD (gastroesophageal reflux disease) 05/08/2018   COPD (chronic obstructive pulmonary disease) (HCC) 05/08/2018   Osteoporosis 12/31/2016   Progress Note Reporting Period  4/17 to 6/10  See note below for Objective Data and Assessment of Progress/Goals.      REFERRING PROVIDER:  Vannie Reche RAMAN, NP    Joane Artist RAMAN, MD      REFERRING DIAG:  [phone] (ICD-10-CM) - Chronic bilateral low back pain without sciatica  M54.6,G89.29 (ICD-10-CM) - Chronic left-sided thoracic back pain   R06.09 (ICD-10-CM) - Exertional dyspnea              R53.81 (ICD-10-CM) - Physical deconditioning  THERAPY DIAG:  Muscle weakness (generalized)  Pain in thoracic spine  Other low back pain  Difficulty in walking, not elsewhere classified  Abnormal posture  Other lack of coordination  Muscle spasm of back  Rationale for Evaluation and Treatment: Rehabilitation  ONSET DATE: PT order 04/24/2023  SUBJECTIVE:   SUBJECTIVE STATEMENT: Patient returns to physical therapy today reporting mild tightness in her upper traps.  She reports she has been having very much pain in all up until this morning.  She is not sure why her upper traps are tight and sore this morning.  She reports overall she is trying to go to the gym.  She reports it went well.  She is slowly working her way back into her gym program.  She feels like she is okay to independently work back into a gym program.  PERTINENT HISTORY: Chronic thoracic pain and lumbar pain CAD s/p CABG x 4 on 01/04/2022 Osteoporosis syncope, pulmonary sarcoidosis, implanted loop recorder Arthritis and HTN Memory problems since CABG surgery--she is being followed by neurology.    PAIN:  4-5/10 L scapular pain current, 10/10 worst, 1/10 best 0/10 lumbar ; 9-10/10 worst  Easing factors:  heating pad, tylenol  Aggravating factors:  Pt not sure what increases her sx's.   PRECAUTIONS: Other: Implanted loop recorder, osteoporosis, balance,  CABG, syncope    WEIGHT BEARING RESTRICTIONS: No  FALLS:  Has patient fallen in last 6 months? Yes. Number of falls 1 on 02/23/2023, syncope  LIVING ENVIRONMENT: Lives with: lives with their spouse Lives in:  1 story home Stairs: 2-4 steps to enter/exit home with rail Has following equipment at home: Single point cane, Walker - 4 wheeled, and walking sticks  OCCUPATION: Pt is retired  PLOF: Independent  PATIENT GOALS: to walk a straight line, to have improved activity tolerance after a short rest break, be more flexible    OBJECTIVE:  Note: Objective measures were completed at Evaluation unless otherwise noted.  DIAGNOSTIC FINDINGS:  Lumbar X rays on 3/4: FINDINGS: Five lumbar type vertebral bodies are well visualized. Vertebral body height is well maintained. Disc space narrowing is noted at L5-S1. No anterolisthesis is seen. No soft tissue abnormality is  noted. Right renal artery stent is seen.   IMPRESSION: Degenerative change without acute abnormality.   CT chest on 3/5: IMPRESSION: *Stable extensive fibro atelectatic fibrocalcific changes of both lungs with significant bilateral bronchiectasis and scar changes involving particularly the upper and mid lung distributions with significant central pulmonary retraction and traction bronchiectasis and architectural distortion. These findings correlate with chronic interstitial lung disease and correlate with sarcoidosis stage IV. *No new significant findings. No acute infiltrates or consolidations. *Stable multiple calcified mediastinal and hilar lymph nodes.Per MD note, Lumbar X-rays showed DDD worse L5-S1.  No acute fractures are visible.  LOWER EXTREMITY MMT:   MMT Right eval Left eval Right  6/10 Left 6/10  Hip flexion 4-/5 4/5 4/5 4+/5  Hip extension        Hip abduction 18.5 18.9    Hip adduction        Hip internal rotation        Hip external rotation        Knee flexion 5/5 4+/5 5 5   Knee extension  4/5 4-5 5 5   Ankle dorsiflexion 5/5 5/5 5 5   Ankle plantarflexion        Ankle inversion        Ankle eversion         (Blank rows = not tested)  Posture      FUNCTIONAL TESTS:  5x STS test: 24.05 without UE support 3 MWT:  529 ft.   Pt felt SOB at 1 min 48 sec.  O2:  99%, HR:  104  Walk test not performed today   6/10 Cervical ROM  Rotation 55 degrees right 60 degrees left    TREATMENT DATE:  6/26 Manual Trigger point release to bilateral upper traps in seated position Trigger point release to lower cervical spine paraspinals  TherEX: Reviewed set up of NuStep for independent use 5 minutes level 5 no significant shortness of breath  Use of hip abduction machine but unable to do reps on it secondary to use  Hamstring curl machine 3 x 12 10 pounds  Leg press 3 X12 50 pounds  Reviewed set up of all machines and how to progress weights.  Neuromuscular reeducation: Cable row 10 pounds x 12 patient reported some difficulty Weight reduced to 5 pounds 2 x 12 patient reported RPE of 5  Cable extension 5 pounds 3 x 12 RPE of 5 Reviewed set up and use of machine  6/19 Manual: Skilled palpation of trigger points  Trigger poitn release to T-spaine and upper trap  Gross throacic PA mobilizations  All done in sitting  today.   Neuro-re-ed:  Triceps press down 2x10 20 lbs with cuing for abdominal brace   There-ex:   Cybex leg press 40 lbs Seat back 4 Seat 5 3x10 LF knee extension feet M 10 lbs 3x10     6/16 Manual:  Skilled palpation of trigger points  Trigger poitn release to T-spaine and upper trap  Gross throacic PA mobilizations  Corsshand rib springing for thoracic  Neuro-re-ed  Cybex row machine 15 lbs 3x10  Cable Shoulder extension 5 3x10  With cuing for posture   Seated hip abduction red 3x10  Seated LAQ 2x10  With cuing to sit straight and for TA breathing     6/10  Manual:  Skilled palpation of trigger points  Trigger poitn release to  T-spaine and upper trap  Gross throacic PA mobilizations  Corsshand rib springing for thoracic  There-ex:   Neuro-re-ed:  Bilateral ER  yellow 2x10  Horizontal abduction 2x10 yellow Bilateral flexion yellow 2x10   Reviewed how to use RPE and progress exercises  Updated exercises  6/5  HR:  85 O2:  99%  Nustep x 3 mins at L3 UE/LE's HR:  91 O2:  97%  Pulleys in flexion and scaption 2x10 each Standing rows with RTB 2x10 Sidestepping  x 2 laps at [facility] without UE support Tandem gait with UE support on rail x 3 laps  O2:  97-99% HR: 94-96  Manual Therapy: Gentle STM to L medial scap mm seated   After Rx:  O2:  98-99% HR:  80                                                                                     PATIENT EDUCATION:  Education details:  PT answered pt's questions.  Exercise form, rationale of interventions, and POC.  Person educated: Patient Education method: Explanation, Demonstration, Tactile cues, Verbal cues Education comprehension: verbalized understanding, returned demonstration, verbal cues required, tactile cues required, and needs further education      HOME EXERCISE PROGRAM: Access Code: [email or web address] URL: [email or web address] Date: 05/29/2023 Prepared by: Alm Jenny  Exercises - Standing Tandem Balance with Counter Support  - 2-3 x daily - 1-2 sets - 1-2 reps - 30sec hold - Side Stepping with Counter Support  - 2-3 x daily - 1 sets - 8 reps - Backward Walking with Counter Support  - 2-3 x daily - 1 sets - 8 reps   - Seated Scapular Retraction  - 2 x daily - 7 x weekly - 1-2 sets - 10 reps - Standing Shoulder Row with Anchored Resistance  - 1 x daily - 3-4 x weekly - 2 sets - 10 reps  ASSESSMENT:  CLINICAL IMPRESSION: The patient is making good progress at this time.  Since her injections she has had very little pain in her shoulder and neck.  She feels like her for the first time since her surgery she is getting back to  feeling more normal.  She is able to do her ADLs without significant pain and fatigue.  We have been working her back into an individual gym program.  She plans to progress her weight slowly.  We discussed RPE and how to greater weights.  She is advised to keep her RPE around 4-5 with her exercises.  She is excited to start her program.  She still has 2 more weeks improved.  She is advised in those 2 weeks if she has any kind of acute flareup of pain she can call and schedule.  She will otherwise be discharged to HEP.  See below for goal specific progress. OBJECTIVE IMPAIRMENTS: decreased activity tolerance, decreased balance, decreased endurance, decreased mobility, difficulty walking, decreased strength, and pain.   ACTIVITY LIMITATIONS: standing, squatting, transfers, and locomotion level  PARTICIPATION LIMITATIONS: cleaning, shopping, and community activity  PERSONAL FACTORS: 3+ comorbidities: osteoporosis, CABG, arthritis, syncope, pulmonary sarcoidosis are also affecting patient's functional outcome.   REHAB POTENTIAL: Good  CLINICAL DECISION MAKING: Stable/uncomplicated  EVALUATION COMPLEXITY: Low   GOALS:  SHORT TERM GOALS: Target date: 06/11/2023  Pt will be independent and compliant with  HEP for improved strength, tolerance to activity, functional endurance, pain, and mobility.   Baseline: 06/12/23: HEP ongoing Goal status: progressed HEP 6/11 2.  Pt will report at least a 25% improvement in tolerance to activity and her functional mobility.  Baseline:  06/12/23: reports 35-40% improvement, particularly with housework Goal status: MET  3.  Pt will perform 5x STS test in no > than 19 sec for improved functional LE strength and performance of transfers.   Baseline:  06/12/23: 13.95sec no UE support Goal status: MET  4.  Pt will ambulate entire 6 MWT test.   Baseline:  Goal status: not tested 6/11   5.  Pt will report at least a 25% improvement in thoracic and lumbar pain and  sx's overall.  Goal status:  GOAL MET   6/3  Target date:  significant improvement after testing 6/11    LONG TERM GOALS: Target date: 09/30/2023  Pt will report at least a 70% improvement in her tolerance with daily activities and functional endurance with mobility.    Baseline:    Goal status: 70% MET  6/3  2.  Pt will increase ambulation distance by at least 150 ft on 3 MWT.  Baseline:  Goal status: Mini distances without significant pain or fatigue 6/27  3.  Pt will report increased ambulation distance with reduced fatigue and SOB. Baseline:  Goal status: Only minimal fatigue at x 6/27  4.  Pt will perform 5x STS test in < than 16 sec for improved functional LE strength and performance of transfers.  Baseline:  Goal status: GOAL MET  3/27  5.  Pt will demo improved bilat LE strength to 4+/5 in hip flexion and 5/5 in knee extension and at least 6-8# increase in abd for improved performance of functional mobility and performance of household chores. Baseline:  Goal status: INITIAL  6.  Pt will report at least a 70% improvement in her thoracic and lumbar pain overall.    Goal status: No pain at this time 6/27  Target date:  08/06/2023  7.  Pt will demo improved L shoulder flexion/abd AROM by 15/10 deg for improved UE elevation and reaching.   Goal status:  GOAL MET   6/3  Target date:  08/06/2023  8.  Pt will report she is able to use her L UE more with daily activities by at least 50% without significant pain.   Goal status:  ONGOING  Target date:  08/06/2023     PLAN:  PT FREQUENCY: 2x/week  PT DURATION: 6 Weeks  PLANNED INTERVENTIONS: [id number]- PT Re-evaluation, 97110-Therapeutic exercises, 97530- Therapeutic activity, [id number]- Neuromuscular re-education, 97535- Self Care, [id number]- Manual therapy, [id number]- Gait training, [phone]- Aquatic Therapy, [phone]- Ultrasound, Patient/Family education, Balance training, Stair training, Taping, Dry Needling, Cryotherapy, and Moist  heat  PLAN FOR NEXT SESSION:  review/update HEP PRN.  Cont with gait, ambulation, balance, functional endurance, and  LE strengthening.  Monitoring activity tolerance via borg dyspnea scale and/or vitals. NO e-stim STM to medial scap mm and thoracic and lumbar paraspinals.  Rows with theraband.  Shoulder pulleys Cont with Aquatic therapy.  Assess response/tolerance.  dry needling.  Alm Don PT DPT 09/12/23 7:48 AM

## 2023-09-13 ENCOUNTER — Encounter (HOSPITAL_BASED_OUTPATIENT_CLINIC_OR_DEPARTMENT_OTHER): Payer: Self-pay | Admitting: Physical Therapy

## 2023-09-13 ENCOUNTER — Ambulatory Visit (HOSPITAL_BASED_OUTPATIENT_CLINIC_OR_DEPARTMENT_OTHER): Payer: Self-pay | Admitting: Physical Therapy

## 2023-09-13 DIAGNOSIS — M6281 Muscle weakness (generalized): Secondary | ICD-10-CM | POA: Diagnosis not present

## 2023-09-13 DIAGNOSIS — R293 Abnormal posture: Secondary | ICD-10-CM

## 2023-09-13 DIAGNOSIS — M5459 Other low back pain: Secondary | ICD-10-CM

## 2023-09-13 DIAGNOSIS — M546 Pain in thoracic spine: Secondary | ICD-10-CM

## 2023-09-13 DIAGNOSIS — R278 Other lack of coordination: Secondary | ICD-10-CM

## 2023-09-13 DIAGNOSIS — R262 Difficulty in walking, not elsewhere classified: Secondary | ICD-10-CM

## 2023-09-13 DIAGNOSIS — M62830 Muscle spasm of back: Secondary | ICD-10-CM

## 2023-09-13 NOTE — Therapy (Signed)
 OUTPATIENT PHYSICAL THERAPY TREATMENT /DISCHARGE      PT End of Session - 09/13/23 1200     Visit Number 24    Number of Visits 30    Date for PT Re-Evaluation 09/30/23    Authorization Type MCR A and B    Progress Note Due on Visit 40    PT Start Time 1130    PT Stop Time 1205    PT Time Calculation (min) 35 min    Activity Tolerance Patient tolerated treatment well    Behavior During Therapy [facility] for tasks assessed/performed                 Patient Name: Kristen Suarez MRN: [id number] DOB:November 30, 1945, 78 y.o., female Today's Date: 09/13/2023  END OF SESSION:  PT End of Session - 09/13/23 1200     Visit Number 24    Number of Visits 30    Date for PT Re-Evaluation 09/30/23    Authorization Type MCR A and B    Progress Note Due on Visit 40    PT Start Time 1130    PT Stop Time 1205    PT Time Calculation (min) 35 min    Activity Tolerance Patient tolerated treatment well    Behavior During Therapy [facility] for tasks assessed/performed              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/1976   Weight loss, unintentional 12/31/2022   Past Surgical History:  Procedure Laterality Date   ABDOMINAL HYSTERECTOMY  1987   BASAL CELL CARCINOMA EXCISION  06/30/2020   nose   CARDIAC CATHETERIZATION     CLIPPING OF ATRIAL APPENDAGE N/A 01/04/2022   Procedure: CLIPPING OF ATRIAL APPENDAGE USING SIZE 40 ATRICURE ATRIAL CLIP;  Surgeon: Murriel Toribio DEL, MD;  Location: [facility] OR;  Service: Open Heart Surgery;  Laterality: N/A;   CORONARY ARTERY BYPASS GRAFT N/A 01/04/2022    Procedure: CORONARY ARTERY BYPASS GRAFTING (CABG) x  USING ENDOSCOPIC GREATER SAPHENOUS VEIN HARVEST;  Surgeon: Murriel Toribio DEL, MD;  Location: [facility] OR;  Service: Open Heart Surgery;  Laterality: N/A;   LEFT HEART CATH AND CORONARY ANGIOGRAPHY N/A 01/02/2022   Procedure: LEFT HEART CATH AND CORONARY ANGIOGRAPHY;  Surgeon: Burnard Debby LABOR, MD;  Location: [facility] INVASIVE CV LAB;  Service: Cardiovascular;  Laterality: N/A;   LUNG BIOPSY  06/16/1976   PERIPHERAL VASCULAR INTERVENTION Right 11/29/2020   Procedure: PERIPHERAL VASCULAR INTERVENTION;  Surgeon: Darron Deatrice LABOR, MD;  Location: [facility] INVASIVE CV LAB;  Service: Cardiovascular;  Laterality: Right;  Renal Artery   RENAL ANGIOGRAPHY N/A 11/29/2020   Procedure: RENAL ANGIOGRAPHY;  Surgeon: Darron Deatrice LABOR, MD;  Location: [facility] INVASIVE CV LAB;  Service: Cardiovascular;  Laterality: N/A;   TEE WITHOUT CARDIOVERSION N/A 01/04/2022   Procedure: TRANSESOPHAGEAL ECHOCARDIOGRAM (TEE);  Surgeon: Murriel Toribio DEL, MD;  Location: [facility] OR;  Service: Open Heart Surgery;  Laterality: N/A;   Patient Active Problem List   Diagnosis Date Noted  PAF (paroxysmal atrial fibrillation) (HCC) 07/02/2023   Memory impairment 03/21/2023   Adjustment insomnia 08/19/2022   Nocturia more than twice per night 08/19/2022   Bilateral sensorineural hearing loss 06/27/2022   Hearing loss of right ear 02/04/2022   Hx of CABG 01/04/2022   Elevated coronary artery calcium  score    Chronic kidney disease, stage 3b (HCC) 01/01/2022   Syncope 12/31/2021   Renal artery stenosis (HCC) 09/25/2021   Aortic valve sclerosis 09/25/2021   Snoring 04/13/2021   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   Epistaxis 03/29/2021   Coronary artery disease due to calcified coronary lesion 08/15/2020   Basal cell carcinoma 06/30/2020   History of loop recorder 12/13/2019   Allergic rhinitis 12/08/2019   Chronic cough 11/08/2019   Post-nasal drip 11/08/2019   Multiple joint pain 04/22/2019   Bilateral  leg edema 04/22/2019   Vaginal atrophy 01/05/2019   Resting tremor 09/08/2018   Sarcoidosis 05/08/2018   White coat syndrome with diagnosis of hypertension 05/08/2018   Colon polyp 05/08/2018   Family hx of colon cancer 05/08/2018   Cystocele, midline 05/08/2018   Hyperlipidemia 05/08/2018   Vasovagal syncope 05/08/2018   Seborrheic keratosis 05/08/2018   Pseudophakia of both eyes 05/08/2018   GERD (gastroesophageal reflux disease) 05/08/2018   COPD (chronic obstructive pulmonary disease) (HCC) 05/08/2018   Osteoporosis 12/31/2016   Progress Note Reporting Period  4/17 to 6/10  See note below for Objective Data and Assessment of Progress/Goals.      REFERRING PROVIDER:  Vannie Reche RAMAN, NP    Joane Artist RAMAN, MD      REFERRING DIAG:  [phone] (ICD-10-CM) - Chronic bilateral low back pain without sciatica  M54.6,G89.29 (ICD-10-CM) - Chronic left-sided thoracic back pain   R06.09 (ICD-10-CM) - Exertional dyspnea              R53.81 (ICD-10-CM) - Physical deconditioning  THERAPY DIAG:  Muscle weakness (generalized)  Pain in thoracic spine  Other low back pain  Difficulty in walking, not elsewhere classified  Abnormal posture  Other lack of coordination  Muscle spasm of back  Rationale for Evaluation and Treatment: Rehabilitation  ONSET DATE: PT order 04/24/2023  SUBJECTIVE:   SUBJECTIVE STATEMENT: Pt returns to PT with reports of significant overall improvements in pain, endurance and mobility. She is requesting to be discharged from PT.   PERTINENT HISTORY: Chronic thoracic pain and lumbar pain CAD s/p CABG x 4 on 01/04/2022 Osteoporosis syncope, pulmonary sarcoidosis, implanted loop recorder Arthritis and HTN Memory problems since CABG surgery--she is being followed by neurology.    PAIN:  4-5/10 L scapular pain current, 10/10 worst, 1/10 best 0/10 lumbar ; 9-10/10 worst  Easing factors:  heating pad, tylenol  Aggravating factors:  Pt not  sure what increases her sx's.   PRECAUTIONS: Other: Implanted loop recorder, osteoporosis, balance, CABG, syncope    WEIGHT BEARING RESTRICTIONS: No  FALLS:  Has patient fallen in last 6 months? Yes. Number of falls 1 on 02/23/2023, syncope  LIVING ENVIRONMENT: Lives with: lives with their spouse Lives in:  1 story home Stairs: 2-4 steps to enter/exit home with rail Has following equipment at home: Single point cane, Walker - 4 wheeled, and walking sticks  OCCUPATION: Pt is retired  PLOF: Independent  PATIENT GOALS: to walk a straight line, to have improved activity tolerance after a short rest break, be more flexible    OBJECTIVE:  Note: Objective measures were completed at Evaluation unless otherwise noted.  DIAGNOSTIC FINDINGS:  Lumbar X rays on  3/4: FINDINGS: Five lumbar type vertebral bodies are well visualized. Vertebral body height is well maintained. Disc space narrowing is noted at L5-S1. No anterolisthesis is seen. No soft tissue abnormality is noted. Right renal artery stent is seen.   IMPRESSION: Degenerative change without acute abnormality.   CT chest on 3/5: IMPRESSION: *Stable extensive fibro atelectatic fibrocalcific changes of both lungs with significant bilateral bronchiectasis and scar changes involving particularly the upper and mid lung distributions with significant central pulmonary retraction and traction bronchiectasis and architectural distortion. These findings correlate with chronic interstitial lung disease and correlate with sarcoidosis stage IV. *No new significant findings. No acute infiltrates or consolidations. *Stable multiple calcified mediastinal and hilar lymph nodes.Per MD note, Lumbar X-rays showed DDD worse L5-S1.  No acute fractures are visible.  LOWER EXTREMITY MMT:   MMT Right eval Left eval Right  6/10 Left 6/10  Hip flexion 4-/5 4/5 4/5 4+/5  Hip extension        Hip abduction 18.5 18.9    Hip adduction         Hip internal rotation        Hip external rotation        Knee flexion 5/5 4+/5 5 5   Knee extension 4/5 4-5 5 5   Ankle dorsiflexion 5/5 5/5 5 5   Ankle plantarflexion        Ankle inversion        Ankle eversion         (Blank rows = not tested)  Posture      FUNCTIONAL TESTS:  5x STS test: 24.05 without UE support 3 MWT:  529 ft.   Pt felt SOB at 1 min 48 sec.  O2:  99%, HR:  104  Walk test not performed today   6/10 Cervical ROM  Rotation 55 degrees right 60 degrees left   09/13/2023: 3 MWT: 540 ft.   Pt felt SOB at 2 min 29 sec.  6 MWT: 935ft  Pt requires 2 standing rest breaks. O2 96% at 1 min rest    TREATMENT DATE:  06/28: Nustep lvl 4, 5 min.  6 MWT Reviewed goals  Discussed progressions at home.   6/26 Manual Trigger point release to bilateral upper traps in seated position Trigger point release to lower cervical spine paraspinals  TherEX: Reviewed set up of NuStep for independent use 5 minutes level 5 no significant shortness of breath  Use of hip abduction machine but unable to do reps on it secondary to use  Hamstring curl machine 3 x 12 10 pounds  Leg press 3 X12 50 pounds  Reviewed set up of all machines and how to progress weights.  Neuromuscular reeducation: Cable row 10 pounds x 12 patient reported some difficulty Weight reduced to 5 pounds 2 x 12 patient reported RPE of 5  Cable extension 5 pounds 3 x 12 RPE of 5 Reviewed set up and use of machine  6/19 Manual: Skilled palpation of trigger points  Trigger poitn release to T-spaine and upper trap  Gross throacic PA mobilizations  All done in sitting  today.   Neuro-re-ed:  Triceps press down 2x10 20 lbs with cuing for abdominal brace   There-ex:   Cybex leg press 40 lbs Seat back 4 Seat 5 3x10 LF knee extension feet M 10 lbs 3x10     6/16 Manual:  Skilled palpation of trigger points  Trigger poitn release to T-spaine and upper trap  Gross throacic PA mobilizations   [place] rib springing  for thoracic  Neuro-re-ed  Cybex row machine 15 lbs 3x10  Cable Shoulder extension 5 3x10  With cuing for posture   Seated hip abduction red 3x10  Seated LAQ 2x10  With cuing to sit straight and for TA breathing     6/10  Manual:  Skilled palpation of trigger points  Trigger poitn release to T-spaine and upper trap  Gross throacic PA mobilizations  [place] rib springing for thoracic  There-ex:   Neuro-re-ed:  Bilateral ER yellow 2x10  Horizontal abduction 2x10 yellow Bilateral flexion yellow 2x10   Reviewed how to use RPE and progress exercises  Updated exercises  6/5  HR:  85 O2:  99%  Nustep x 3 mins at L3 UE/LE's HR:  91 O2:  97%  Pulleys in flexion and scaption 2x10 each Standing rows with RTB 2x10 Sidestepping  x 2 laps at [facility] without UE support Tandem gait with UE support on rail x 3 laps  O2:  97-99% HR: 94-96  Manual Therapy: Gentle STM to L medial scap mm seated   After Rx:  O2:  98-99% HR:  80                                                                                     PATIENT EDUCATION:  Education details:  PT answered pt's questions.  Exercise form, rationale of interventions, and POC.  Person educated: Patient Education method: Explanation, Demonstration, Tactile cues, Verbal cues Education comprehension: verbalized understanding, returned demonstration, verbal cues required, tactile cues required, and needs further education      HOME EXERCISE PROGRAM: Access Code: [email or web address] URL: [email or web address] Date: 05/29/2023 Prepared by: Alm Jenny  Exercises - Standing Tandem Balance with Counter Support  - 2-3 x daily - 1-2 sets - 1-2 reps - 30sec hold - Side Stepping with Counter Support  - 2-3 x daily - 1 sets - 8 reps - Backward Walking with Counter Support  - 2-3 x daily - 1 sets - 8 reps   - Seated Scapular Retraction  - 2 x daily - 7 x weekly - 1-2 sets - 10 reps - Standing  Shoulder Row with Anchored Resistance  - 1 x daily - 3-4 x weekly - 2 sets - 10 reps  ASSESSMENT:  CLINICAL IMPRESSION: Samora Jernberg Trahan has progressed well with therapy.  Improved impairments include: Cardiovascular endurance, strength and functional mobility. She is now able to walk further in community settings without SOB or fatigue. She also reports improvements in her UE ROM and a reduction in pain.  Functional improvements include: lifting, reaching, walking, pushing and pulling. Pt will continue working on her cardiovascular endurance and strengthening at home and at the gym with her HEP. She feels comfortable with all the information she has been provided and was ensured she is able to seek out further instructions when needed.  Please see GOALS section for progress on short term and long term goals established at evaluation.  I recommend D/C home with HEP; pt agrees with plan.   OBJECTIVE IMPAIRMENTS: decreased activity tolerance, decreased balance, decreased endurance, decreased mobility, difficulty walking, decreased strength, and pain.   ACTIVITY LIMITATIONS: standing, squatting, transfers, and  locomotion level  PARTICIPATION LIMITATIONS: cleaning, shopping, and community activity  PERSONAL FACTORS: 3+ comorbidities: osteoporosis, CABG, arthritis, syncope, pulmonary sarcoidosis are also affecting patient's functional outcome.   REHAB POTENTIAL: Good  CLINICAL DECISION MAKING: Stable/uncomplicated  EVALUATION COMPLEXITY: Low   GOALS:  SHORT TERM GOALS: Target date: 06/11/2023  Pt will be independent and compliant with HEP for improved strength, tolerance to activity, functional endurance, pain, and mobility.   Baseline: 06/12/23: HEP ongoing Goal status: MET 06/28 2.  Pt will report at least a 25% improvement in tolerance to activity and her functional mobility.  Baseline:  06/12/23: reports 35-40% improvement, particularly with housework Goal status: MET  3.  Pt will  perform 5x STS test in no > than 19 sec for improved functional LE strength and performance of transfers.   Baseline:  06/12/23: 13.95sec no UE support Goal status: MET  4.  Pt will ambulate entire 6 MWT test.   Baseline:  Goal status: not tested 6/11   5.  Pt will report at least a 25% improvement in thoracic and lumbar pain and sx's overall.  Goal status:  GOAL MET   6/3  Target date:  significant improvement after testing 6/11  MET 09/13/2023    LONG TERM GOALS: Target date: 09/30/2023  Pt will report at least a 70% improvement in her tolerance with daily activities and functional endurance with mobility.    Baseline:    Goal status: 70% MET  6/3  2.  Pt will increase ambulation distance by at least 150 ft on 3 MWT.  Baseline:  Goal status: Mini distances without significant pain or fatigue 6/27  3.  Pt will report increased ambulation distance with reduced fatigue and SOB. Baseline:  Goal status: Only minimal fatigue at x 6/27  4.  Pt will perform 5x STS test in < than 16 sec for improved functional LE strength and performance of transfers.  Baseline:  Goal status: GOAL MET  3/27  5.  Pt will demo improved bilat LE strength to 4+/5 in hip flexion and 5/5 in knee extension and at least 6-8# increase in abd for improved performance of functional mobility and performance of household chores. Baseline:  Goal status: MET 06/11  6.  Pt will report at least a 70% improvement in her thoracic and lumbar pain overall.    Goal status: No pain at this time 6/27 90% improvement   Target date: 09/13/2023   7.  Pt will demo improved L shoulder flexion/abd AROM by 15/10 deg for improved UE elevation and reaching.   Goal status:  GOAL MET   6/3  Target date:  09/13/2023 90% improvement   8.  Pt will report she is able to use her L UE more with daily activities by at least 50% without significant pain.   Goal status: MET 09/13/2023  Target date:  08/06/2023   Rojean Batten PT,  DPT 09/13/23  12:01 PM

## 2023-10-13 ENCOUNTER — Telehealth: Payer: Self-pay

## 2023-10-13 NOTE — Telephone Encounter (Signed)
 Prolia  VOB initiated via [email or web address]  Next Prolia  inj DUE: 11/12/23

## 2023-10-16 ENCOUNTER — Other Ambulatory Visit (HOSPITAL_COMMUNITY): Payer: Self-pay

## 2023-10-16 NOTE — Telephone Encounter (Signed)
 Pt ready for scheduling for PROLIA  on or after : 11/12/23  Option# 1: Buy/Bill (Office supplied medication)  Out-of-pocket cost due at time of clinic visit: $25  Number of injection/visits approved: ---  Primary: MEDICARE Prolia  co-insurance: 0% Admin fee co-insurance: $25  Secondary: [employer] EMPIRE [employer]AN-COMMERCIAL Prolia  co-insurance:  Admin fee co-insurance:   Medical Benefit Details: Date Benefits were checked: 10/16/23 Deductible: $257 Met of $257 Required/ Coinsurance: 0%/ Admin Fee: $25  Prior Auth: N/A PA# Expiration Date:   # of doses approved: ----------------------------------------------------------------------- Option# 2- Med Obtained from pharmacy:  Pharmacy benefit: Copay $0 (Paid to pharmacy) Admin Fee: $25 (Pay at clinic)  Prior Auth: N/A PA# Expiration Date:   # of doses approved:   If patient wants fill through the pharmacy benefit please send prescription to: [facility], and include estimated need by date in rx notes. Pharmacy will ship medication directly to the office.  Patient NOT eligible for Prolia  Copay Card. Copay Card can make patient's cost as little as $25. Link to apply: [email or web address]  ** This summary of benefits is an estimation of the patient's out-of-pocket cost. Exact cost may very based on individual plan coverage.

## 2023-10-16 NOTE — Telephone Encounter (Signed)
 Kristen Suarez

## 2023-10-24 ENCOUNTER — Ambulatory Visit: Admitting: Pulmonary Disease

## 2023-10-24 VITALS — BP 132/73 | HR 91 | Temp 91.8°F | Ht 62.0 in | Wt 127.2 lb

## 2023-10-24 DIAGNOSIS — J439 Emphysema, unspecified: Secondary | ICD-10-CM

## 2023-10-24 DIAGNOSIS — J479 Bronchiectasis, uncomplicated: Secondary | ICD-10-CM | POA: Diagnosis not present

## 2023-10-24 DIAGNOSIS — D869 Sarcoidosis, unspecified: Secondary | ICD-10-CM

## 2023-10-24 DIAGNOSIS — I2510 Atherosclerotic heart disease of native coronary artery without angina pectoris: Secondary | ICD-10-CM | POA: Diagnosis not present

## 2023-10-24 DIAGNOSIS — R0602 Shortness of breath: Secondary | ICD-10-CM

## 2023-10-24 DIAGNOSIS — Z950 Presence of cardiac pacemaker: Secondary | ICD-10-CM | POA: Diagnosis not present

## 2023-10-24 DIAGNOSIS — R053 Chronic cough: Secondary | ICD-10-CM

## 2023-10-24 MED ORDER — PREDNISONE 20 MG PO TABS: 20.0000 mg | ORAL_TABLET | Freq: Every day | ORAL | 0 refills | Status: DC

## 2023-10-24 MED ORDER — AZITHROMYCIN 250 MG PO TABS
ORAL_TABLET | ORAL | 0 refills | Status: DC
Start: 2023-10-24 — End: 2023-12-02

## 2023-10-24 MED ORDER — ALBUTEROL SULFATE (2.5 MG/3ML) 0.083% IN NEBU: 2.5000 mg | INHALATION_SOLUTION | RESPIRATORY_TRACT | 1 refills | Status: DC | PRN

## 2023-10-24 NOTE — Patient Instructions (Signed)
 Prescription for antibiotic and steroid was sent to pharmacy for you  We will schedule you for breathing study to be done on the day of the next visit in about 6 to 8 weeks  Call us  with significant concerns

## 2023-10-24 NOTE — Progress Notes (Signed)
 Kristen Suarez    [id number]    April 02, 1945  Primary Care Physician:Nche, Roselie Rockford, NP  Referring Physician: Katheen Roselie Rockford, NP [address] [place],  [place] [address]  Chief complaint:   Patient in for follow-up today Increased cough and congestion  HPI:  Patient with a history of sarcoidosis, obstructive lung disease, emphysema  Increased cough and congestion in the last couple of days with mucus production No fevers, no chills  Mucus was yellowish and a little darker in the last couple of days  Tries to stay active exercising regularly  She is s/p CABG in 2023  Has a history of severe obstructive lung disease, restrictive disease associated with sarcoidosis, significant scarring in the lungs  Uses Breztri  regularly, rescue inhalers as needed  Concerned about allergies, uses Zyrtec   Outpatient Encounter Medications as of 10/24/2023  Medication Sig   albuterol  (VENTOLIN  HFA) 108 (90 Base) MCG/ACT inhaler Inhale 2 puffs into the lungs every 6 (six) hours as needed for wheezing or shortness of breath.   amLODipine  (NORVASC ) 10 MG tablet Take 1 tablet (10 mg total) by mouth daily.   aspirin  EC 81 MG tablet Take 1 tablet (81 mg total) by mouth daily. Swallow whole.   Budeson-Glycopyrrol-Formoterol  (BREZTRI  AEROSPHERE) 160-9-4.8 MCG/ACT AERO Inhale 2 puffs into the lungs in the morning and at bedtime.   calcium  carbonate (CALCIUM  600) 600 MG TABS tablet Take 600 mg by mouth daily with breakfast.   cetirizine (ZYRTEC) 10 MG tablet Take 10 mg by mouth daily.   CRANBERRY PO Take by mouth.   cyanocobalamin 1000 MCG tablet Take 1,000 mcg by mouth daily.   denosumab  (PROLIA ) 60 MG/ML SOSY injection Inject 60 mg into the skin every 6 (six) months.   docusate sodium  (COLACE) 100 MG capsule Take 100 mg by mouth daily.    Evolocumab  (REPATHA  SURECLICK) 140 MG/ML SOAJ INJECT 140 MG INTO THE SKIN EVERY 14 (FOURTEEN) DAYS.   famotidine  (PEPCID ) 40 MG  tablet Take 40 mg by mouth at bedtime.   fluticasone  (FLONASE ) 50 MCG/ACT nasal spray SPRAY 2 SPRAYS INTO EACH NOSTRIL EVERY DAY   MAGNESIUM  PO Take by mouth every other day.   metoprolol  succinate (TOPROL  XL) 25 MG 24 hr tablet Take 1 tablet (25 mg total) by mouth daily.   Multiple Vitamins-Minerals (MULTIVITAL PO) Take 1 tablet by mouth daily.    pantoprazole  (PROTONIX ) 40 MG tablet TAKE 1 TABLET BY MOUTH EVERY DAY   Probiotic Product (PROBIOTIC DAILY PO) Take 1 tablet by mouth daily.    rosuvastatin  (CRESTOR ) 40 MG tablet TAKE 1 TABLET BY MOUTH EVERY DAY   sodium chloride  (OCEAN) 0.65 % SOLN nasal spray Place 1 spray into both nostrils as needed for congestion.   traZODone  (DESYREL ) 50 MG tablet Take 0.5 tablets (25 mg total) by mouth at bedtime.   valsartan  (DIOVAN ) 40 MG tablet Take 1 tablet (40 mg total) by mouth daily.   [DISCONTINUED] albuterol  (PROVENTIL ) (2.5 MG/3ML) 0.083% nebulizer solution TAKE 3 MLS (2.5 MG TOTAL) BY NEBULIZATION EVERY 4 (FOUR) HOURS AS NEEDED.   albuterol  (PROVENTIL ) (2.5 MG/3ML) 0.083% nebulizer solution Take 3 mLs (2.5 mg total) by nebulization every 4 (four) hours as needed.   benzonatate  (TESSALON ) 200 MG capsule Take 1 capsule (200 mg total) by mouth 3 (three) times daily as needed.   predniSONE  (DELTASONE ) 20 MG tablet Take 1 tablet (20 mg total) by mouth daily with breakfast. (Patient not taking: Reported on  10/24/2023)   Facility-Administered Encounter Medications as of 10/24/2023  Medication   [START ON 11/12/2023] denosumab  (PROLIA ) injection 60 mg    Allergies as of 10/24/2023 - Review Complete 10/24/2023  Allergen Reaction Noted   Keflex [cephalexin] Rash 08/27/2018   Penicillins Itching 04/14/2018   Sulfa antibiotics Itching 04/14/2018    Past Medical History:  Diagnosis Date   Abnormality of left atrial appendage 12/2021   s/p LAA clipping   Aortic valve sclerosis 09/25/2021   Arthritis    Asthma    CAD in native artery 08/15/2020    Chickenpox    Colon polyps 2010   Epistaxis 03/29/2021   Heart murmur    History of fainting spells of unknown cause    Hyperlipidemia    Hypertension    Renal artery stenosis (HCC) 09/25/2021   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/1976   Weight loss, unintentional 12/31/2022    Past Surgical History:  Procedure Laterality Date   ABDOMINAL HYSTERECTOMY  1987   BASAL CELL CARCINOMA EXCISION  06/30/2020   nose   CARDIAC CATHETERIZATION     CLIPPING OF ATRIAL APPENDAGE N/A 01/04/2022   Procedure: CLIPPING OF ATRIAL APPENDAGE USING SIZE 40 ATRICURE ATRIAL CLIP;  Surgeon: Murriel Toribio DEL, MD;  Location: [facility] OR;  Service: Open Heart Surgery;  Laterality: N/A;   CORONARY ARTERY BYPASS GRAFT N/A 01/04/2022   Procedure: CORONARY ARTERY BYPASS GRAFTING (CABG) x  USING ENDOSCOPIC GREATER SAPHENOUS VEIN HARVEST;  Surgeon: Murriel Toribio DEL, MD;  Location: [facility] OR;  Service: Open Heart Surgery;  Laterality: N/A;   LEFT HEART CATH AND CORONARY ANGIOGRAPHY N/A 01/02/2022   Procedure: LEFT HEART CATH AND CORONARY ANGIOGRAPHY;  Surgeon: Burnard Debby LABOR, MD;  Location: [facility] IN[facility]ASIVE [facility];  Service: Cardiovascular;  Laterality: N/A;   LUNG BIOPSY  06/16/1976   PERIPHERAL VASCULAR INTERVENTION Right 11/29/2020   Procedure: PERIPHERAL VASCULAR INTERVENTION;  Surgeon: Darron Deatrice LABOR, MD;  Location: [facility];  Service: Cardiovascular;  Laterality: Right;  Renal Artery   RENAL ANGIOGRAPHY N/A 11/29/2020   Procedure: RENAL ANGIOGRAPHY;  Surgeon: Darron Deatrice LABOR, MD;  Location: [facility];  Service: Cardiovascular;  Laterality: N/A;   TEE WITHOUT CARDIOVERSION N/A 01/04/2022   Procedure: TRANSESOPHAGEAL ECHOCARDIOGRAM (TEE);  Surgeon: Murriel Toribio DEL, MD;  Location: [facility];  Service: Open Heart Surgery;  Laterality: N/A;    Family History  Problem Relation Age of Onset   Arthritis Mother    Cancer Mother    Depression Mother    Heart attack Mother    Hyperlipidemia Mother     Hypertension Mother    Alcohol abuse Father    Heart disease Father    Hypertension Father    Hyperlipidemia Father    Heart disease Sister    Stroke Sister    Hyperlipidemia Sister    Alcohol abuse Sister    Hyperlipidemia Sister    Hypertension Sister    Alcohol abuse Brother    Heart disease Brother    Hypertension Brother    Hyperlipidemia Brother    Alcohol abuse Brother    Early death Brother    Heart attack Brother    Heart disease Brother    Hyperlipidemia Brother    Heart disease Maternal Grandfather    Heart disease Paternal Grandfather    Multiple sclerosis Daughter    Hyperlipidemia Son     Social History   Socioeconomic History   Marital status: Married    Spouse name:  Not on file   Number of children: 3   Years of education: 102   Highest education level: Master's degree (e.g., MA, MS, MEng, MEd, MSW, MBA)  Occupational History   Occupation: retired  Tobacco Use   Smoking status: Never    Passive exposure: Past   Smokeless tobacco: Never  Vaping Use   Vaping status: Never Used  Substance and Sexual Activity   Alcohol use: Not Currently    Comment: once in a while/rarely   Drug use: Never   Sexual activity: Not Currently  Other Topics Concern   Not on file  Social History Narrative   Right handed    No caffeine   One level   Retired Comptroller   Lives with husband   Social Drivers of Corporate investment banker Strain: Low Risk  (06/28/2023)   Overall Financial Resource Strain (CARDIA)    Difficulty of Paying Living Expenses: Not hard at all  Food Insecurity: No Food Insecurity (06/28/2023)   Hunger Vital Sign    Worried About Running Out of Food in the Last Year: Never true    Ran Out of Food in the Last Year: Never true  Transportation Needs: No Transportation Needs (06/28/2023)   PRAPARE - Administrator, Civil Service (Medical): No    Lack of Transportation (Non-Medical): No  Physical Activity: Sufficiently Active (06/28/2023)    Exercise Vital Sign    Days of Exercise per Week: 4 days    Minutes of Exercise per Session: 40 min  Stress: No Stress Concern Present (06/28/2023)   [employer] of Occupational Health - Occupational Stress Questionnaire    Feeling of Stress : Not at all  Social Connections: Moderately Isolated (06/28/2023)   Social Connection and Isolation Panel    Frequency of Communication with Friends and Family: Twice a week    Frequency of Social Gatherings with Friends and Family: Never    Attends Religious Services: More than 4 times per year    Active Member of [employer] or Organizations: No    Attends Banker Meetings: Never    Marital Status: Married  Catering manager Violence: Not At Risk (03/03/2023)   Humiliation, Afraid, Rape, and Kick questionnaire    Fear of Current or Ex-Partner: No    Emotionally Abused: No    Physically Abused: No    Sexually Abused: No    Review of Systems  Respiratory:  Positive for cough and shortness of breath.     Vitals:   10/24/23 0851  BP: 132/73  Pulse: 91  Temp: (!) 91.8 F (33.2 C)  SpO2: 98%     Physical Exam Constitutional:      Appearance: Normal appearance.  HENT:     Head: Normocephalic.     Mouth/Throat:     Mouth: Mucous membranes are moist.  Eyes:     General: No scleral icterus. Cardiovascular:     Rate and Rhythm: Normal rate and regular rhythm.     Heart sounds: No murmur heard.    No friction rub.  Pulmonary:     Effort: No respiratory distress.     Breath sounds: No stridor. No wheezing or rhonchi.  Musculoskeletal:     Cervical back: No rigidity or tenderness.  Neurological:     Mental Status: She is alert.  Psychiatric:        Mood and Affect: Mood normal.    Data Reviewed: Most recent CT scan of the chest 2025 reviewed with the  patient, stable compared to 2023 and as far back as 2019 Pulmonary function test shows severe obstructive disease mild progression from previous restriction, diffusing  capacity stayed stable  Did review the CT scans and PFT with the patient during the visit  Assessment:   Emphysema/COPD - To continue Breztri  - Appears to have an exacerbation at present - Encouraged to continue inhalers  Will call her in a course of azithromycin  and prednisone    Sarcoidosis with fibrosis, bronchiectasis - This has stayed stable  Coronary artery disease, status post CABG - Participating in physical therapy - Improvement in activity levels   Plan/Recommendations: Thank you  Continue Breztri , continue albuterol   Course of azithromycin  and prednisone   Follow-up in about 6 to 8 weeks - Will plan for pulmonary function test, the last time she had a PFT was last year  Encouraged to stay active  Call us  with significant concerns  Jennet Epley MD [facility] 10/24/2023, 8:58 AM  CC: Nche, Roselie Rockford, NP

## 2023-10-28 ENCOUNTER — Encounter: Payer: Self-pay | Admitting: Nurse Practitioner

## 2023-10-28 LAB — HM DIABETES EYE EXAM

## 2023-10-29 ENCOUNTER — Other Ambulatory Visit (HOSPITAL_BASED_OUTPATIENT_CLINIC_OR_DEPARTMENT_OTHER): Payer: Self-pay

## 2023-10-29 ENCOUNTER — Other Ambulatory Visit: Payer: Self-pay | Admitting: Nurse Practitioner

## 2023-10-29 ENCOUNTER — Encounter: Payer: Self-pay | Admitting: Nurse Practitioner

## 2023-10-29 DIAGNOSIS — N6489 Other specified disorders of breast: Secondary | ICD-10-CM

## 2023-11-01 ENCOUNTER — Other Ambulatory Visit: Payer: Self-pay | Admitting: Emergency Medicine

## 2023-11-03 ENCOUNTER — Encounter: Payer: Self-pay | Admitting: Nurse Practitioner

## 2023-11-03 DIAGNOSIS — M8589 Other specified disorders of bone density and structure, multiple sites: Secondary | ICD-10-CM

## 2023-11-03 MED ORDER — DENOSUMAB 60 MG/ML ~~LOC~~ SOSY: PREFILLED_SYRINGE | SUBCUTANEOUS | 0 refills | Status: DC

## 2023-11-07 ENCOUNTER — Other Ambulatory Visit (HOSPITAL_BASED_OUTPATIENT_CLINIC_OR_DEPARTMENT_OTHER): Payer: Self-pay | Admitting: Internal Medicine

## 2023-11-12 NOTE — Telephone Encounter (Addendum)
 Prolia  arrived. Placed in fridge. Patient supplied. No copy due Clinical: drop admin fee in wrap up tab.

## 2023-11-13 ENCOUNTER — Ambulatory Visit (INDEPENDENT_AMBULATORY_CARE_PROVIDER_SITE_OTHER)

## 2023-11-13 DIAGNOSIS — M8589 Other specified disorders of bone density and structure, multiple sites: Secondary | ICD-10-CM

## 2023-11-13 MED ORDER — DENOSUMAB 60 MG/ML ~~LOC~~ SOSY: 60.0000 mg | PREFILLED_SYRINGE | Freq: Once | SUBCUTANEOUS | Status: AC

## 2023-11-13 NOTE — Progress Notes (Signed)
 Per orders of [facility],  injection of Prolia  given in the RT arm by Karna Christ, cma.  Patient tolerated injection well and advised that next one will be due in February 2026.  Dm/cma

## 2023-11-18 NOTE — Progress Notes (Signed)
 Office Visit Note  Patient: Kristen Suarez             Date of Birth: 11-06-1945           MRN: [id number]             PCP: Katheen Roselie Rockford, NP Referring: Katheen Roselie Rockford, NP Visit Date: 12/02/2023 Occupation: @GUAROCC @  Subjective:  Pain in joints  History of Present Illness: Kristen Suarez is a 78 y.o. female with sarcoidosis, osteoarthritis and osteopenia.  She returns today after her last visit on May 27, 2023.  She continues to have some discomfort in her hands and upper back.  She went to physical therapy from March 2025 until May 2025.  She has been doing some of those exercises at home. Patient states she continues to have chronic cough especially in the morning.  She has been followed by Dr. Johnella every 3 months.  She states PFTs are pending.  She has history of sarcoidosis and also chronic bronchitis.  Patient states she has had few courses of antibiotics and prednisone .  I reviewed the records from October 24, 2023 according to the notes she had emphysema/COPD exacerbation which was treated with breast tree, albuterol , azithromycin  and prednisone . She continues to be on Prolia  shots for osteoporosis.  Has been taking calcium  and vitamin D.    Activities of Daily Living:  Patient reports morning stiffness for 10 minutes.   Patient Denies nocturnal pain.  Difficulty dressing/grooming: Denies Difficulty climbing stairs: Reports Difficulty getting out of chair: Reports Difficulty using hands for taps, buttons, cutlery, and/or writing: Reports  Review of Systems  Constitutional:  Positive for fatigue.  HENT:  Negative for mouth sores and mouth dryness.   Eyes:  Negative for dryness.  Respiratory:  Positive for shortness of breath.   Cardiovascular:  Positive for palpitations. Negative for chest pain.  Gastrointestinal:  Negative for blood in stool, constipation and diarrhea.  Endocrine: Negative for increased urination.  Genitourinary:  Negative for  involuntary urination.  Musculoskeletal:  Positive for joint pain, gait problem, joint pain, joint swelling, myalgias, morning stiffness, muscle tenderness and myalgias. Negative for muscle weakness.  Skin:  Negative for color change, rash, hair loss and sensitivity to sunlight.  Allergic/Immunologic: Positive for susceptible to infections.  Neurological:  Negative for dizziness and headaches.  Hematological:  Negative for swollen glands.  Psychiatric/Behavioral:  Positive for sleep disturbance. Negative for depressed mood. The patient is nervous/anxious.     PMFS History:  Patient Active Problem List   Diagnosis Date Noted   PAF (paroxysmal atrial fibrillation) (HCC) 07/02/2023   Memory impairment 03/21/2023   Adjustment insomnia 08/19/2022   Nocturia more than twice per night 08/19/2022   Bilateral sensorineural hearing loss 06/27/2022   Hearing loss of right ear 02/04/2022   Hx of CABG 01/04/2022   Elevated coronary artery calcium  score    Chronic kidney disease, stage 3b (HCC) 01/01/2022   Syncope 12/31/2021   Renal artery stenosis (HCC) 09/25/2021   Aortic valve sclerosis 09/25/2021   Snoring 04/13/2021   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   Epistaxis 03/29/2021   Coronary artery disease due to calcified coronary lesion 08/15/2020   Basal cell carcinoma 06/30/2020   History of loop recorder 12/13/2019   Allergic rhinitis 12/08/2019   Chronic cough 11/08/2019   Post-nasal drip 11/08/2019   Multiple joint pain 04/22/2019   Bilateral leg edema 04/22/2019   Vaginal atrophy 01/05/2019   Resting tremor 09/08/2018   Sarcoidosis 05/08/2018  White coat syndrome with diagnosis of hypertension 05/08/2018   Colon polyp 05/08/2018   Family hx of colon cancer 05/08/2018   Cystocele, midline 05/08/2018   Hyperlipidemia 05/08/2018   Vasovagal syncope 05/08/2018   Seborrheic keratosis 05/08/2018   Pseudophakia of both eyes 05/08/2018   GERD (gastroesophageal reflux disease)  05/08/2018   COPD (chronic obstructive pulmonary disease) (HCC) 05/08/2018   Osteoporosis 12/31/2016    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/1976   Weight loss, unintentional 12/31/2022    Family History  Problem Relation Age of Onset   Arthritis Mother    Cancer Mother    Depression Mother    Heart attack Mother    Hyperlipidemia Mother    Hypertension Mother    Alcohol abuse Father    Heart disease Father    Hypertension Father    Hyperlipidemia Father    Heart disease Sister    Stroke Sister    Hyperlipidemia Sister    Alcohol abuse Sister    Hyperlipidemia Sister    Hypertension Sister    Alcohol abuse Brother    Heart disease Brother    Hypertension Brother    Hyperlipidemia Brother    Alcohol abuse Brother    Early death Brother    Heart attack Brother    Heart disease Brother    Hyperlipidemia Brother    Heart disease Maternal Grandfather    Heart disease Paternal Grandfather    Multiple sclerosis Daughter    Hyperlipidemia Son    Past Surgical History:  Procedure Laterality Date   ABDOMINAL HYSTERECTOMY  1987   BASAL CELL CARCINOMA EXCISION  06/30/2020   nose   CARDIAC CATHETERIZATION     CLIPPING OF ATRIAL APPENDAGE N/A 01/04/2022   Procedure: CLIPPING OF ATRIAL APPENDAGE USING SIZE 40 ATRICURE ATRIAL CLIP;  Surgeon: Murriel Toribio DEL, MD;  Location: [facility] OR;  Service: Open Heart Surgery;  Laterality: N/A;   CORONARY ARTERY BYPASS GRAFT N/A 01/04/2022   Procedure: CORONARY ARTERY BYPASS GRAFTING (CABG) x  USING ENDOSCOPIC GREATER SAPHENOUS VEIN HARVEST;   Surgeon: Murriel Toribio DEL, MD;  Location: [facility] OR;  Service: Open Heart Surgery;  Laterality: N/A;   LEFT HEART CATH AND CORONARY ANGIOGRAPHY N/A 01/02/2022   Procedure: LEFT HEART CATH AND CORONARY ANGIOGRAPHY;  Surgeon: Burnard Debby LABOR, MD;  Location: [facility] IN[facility]ASIVE [facility];  Service: Cardiovascular;  Laterality: N/A;   LUNG BIOPSY  06/16/1976   PERIPHERAL VASCULAR INTERVENTION Right 11/29/2020   Procedure: PERIPHERAL VASCULAR INTERVENTION;  Surgeon: Darron Deatrice LABOR, MD;  Location: [facility];  Service: Cardiovascular;  Laterality: Right;  Renal Artery   RENAL ANGIOGRAPHY N/A 11/29/2020   Procedure: RENAL ANGIOGRAPHY;  Surgeon: Darron Deatrice LABOR, MD;  Location: [facility];  Service: Cardiovascular;  Laterality: N/A;   TEE WITHOUT CARDIOVERSION N/A 01/04/2022   Procedure: TRANSESOPHAGEAL ECHOCARDIOGRAM (TEE);  Surgeon: Murriel Toribio DEL, MD;  Location: [facility] OR;  Service: Open Heart Surgery;  Laterality: N/A;   Social History  Social History Narrative   Right handed    No caffeine   One level   Retired Comptroller   Lives with husband   Immunization History  Administered Date(s) Administered   Fluad  Quad(high Dose 65+) 11/19/2018, 12/22/2020, 11/30/2021   Fluad  Trivalent(High Dose 65+) 12/13/2022   INFLUENZA, HIGH DOSE SEASONAL PF 01/05/2016, 12/12/2016, 12/11/2017   Influenza,inj,Quad PF,6+ Mos 12/11/2017   Influenza-Unspecified 11/24/2019, 11/24/2019   Moderna Covid-19 Fall Seasonal Vaccine 110yrs & older 08/06/2022   PFIZER Comirnaty (Gray Top)Covid-19 Tri-Sucrose Vaccine 06/27/2020   PFIZER(Purple Top)SARS-COV-2 Vaccination 05/14/2019, 06/08/2019, 12/20/2019, 08/09/2022   Pfizer Covid-19 Vaccine Bivalent Booster 61yrs & up 12/14/2020   Pfizer(Comirnaty )Fall Seasonal Vaccine 12 years and older 12/23/2021, 12/13/2022, 06/13/2023   Pneumococcal Conjugate-13 03/23/2014   Pneumococcal Polysaccharide-23 03/24/2012, 01/22/2016   Respiratory Syncytial Virus Vaccine ,Recomb  Aduvanted(Arexvy ) 11/30/2021   Tdap 03/24/2012, 12/02/2022   Zoster Recombinant(Shingrix ) 07/31/2020, 01/29/2021     Objective: Vital Signs: BP (!) 156/76   Pulse 77   Temp (!) 97.4 F (36.3 C)   Resp 16   Ht 5' 2 (1.575 m)   Wt 128 lb 12.8 oz (58.4 kg)   BMI 23.56 kg/m    Physical Exam Vitals and nursing note reviewed.  Constitutional:      Appearance: She is well-developed.  HENT:     Head: Normocephalic and atraumatic.  Eyes:     Conjunctiva/sclera: Conjunctivae normal.  Cardiovascular:     Rate and Rhythm: Normal rate and regular rhythm.     Heart sounds: Murmur heard.  Pulmonary:     Effort: Pulmonary effort is normal.     Breath sounds: Normal breath sounds.  Abdominal:     General: Bowel sounds are normal.     Palpations: Abdomen is soft.  Musculoskeletal:     Cervical back: Normal range of motion.     Right lower leg: Edema present.     Left lower leg: Edema present.  Lymphadenopathy:     Cervical: No cervical adenopathy.  Skin:    General: Skin is warm and dry.     Capillary Refill: Capillary refill takes less than 2 seconds.  Neurological:     Mental Status: She is alert and oriented to person, place, and time.  Psychiatric:        Behavior: Behavior normal.      Musculoskeletal Exam: Limited range of motion of the cervical spine.  Thoracic kyphosis without tenderness was noted.  There was no SI joint tenderness.  Shoulder joints, elbow joints, wrist joints were in good range of motion.  She had bilateral CMC, PIP and DIP thickening.  She did not complete fist formation due to limited range of motion of DIP and PIP joints..  Hip joints and knee joints were in good range of motion without any warmth swelling or effusion.  There was no tenderness over ankles or MTPs.  Bilateral pedal edema was noted.   CDAI Exam: CDAI Score: -- Patient Global: --; Provider Global: -- Swollen: --; Tender: -- Joint Exam 12/02/2023   No joint exam has been documented  for this visit   There is currently no information documented on the homunculus. Go to the Rheumatology activity and complete the homunculus joint exam.  Investigation: No additional findings.  Imaging: MM 3D DIAGNOSTIC MAMMOGRAM UNILATERAL LEFT BREAST Result Date: 11/25/2023 CLINICAL DATA:  78 year old female here for a six-month follow-up for an MLO asymmetry about the upper posterior breast. EXAM: DIGITAL DIAGNOSTIC UNILATERAL LEFT MAMMOGRAM WITH TOMOSYNTHESIS AND CAD TECHNIQUE: Left digital diagnostic  mammography and breast tomosynthesis was performed. The images were evaluated with computer-aided detection. COMPARISON:  Previous exam(s). ACR Breast Density Category b: There are scattered areas of fibroglandular density. FINDINGS: No significant interval change of the MLO asymmetry about the upper posterior breast. No findings suspicious for malignancy. IMPRESSION: Probably benign left breast asymmetry. RECOMMENDATION: Today's exam completes 6 months of BI-RADS 3 surveillance. Two additional follow-up exams are recommended (one in 6 months and one in 18 months) to complete the minimum 24 months of BI-RADS 3 surveillance. If the probably benign finding is seen to decrease or disappear at any time during the 24 months of surveillance, the assessment will be changed to benign (BI-RADS 2) or negative (BI-RADS 1) with a recommendation for return to routine annual mammographic screening. Additionally, if the probably benign finding demonstrates at least 24 months of stability, the final assessment category will be changed to benign (BI-RADS 2) with a recommendation for return to routine annual mammographic screening. The next follow-up examination should be performed in approximately 6 months - a bilateral diagnostic mammogram. I have discussed the findings and recommendations with the patient. If applicable, a reminder letter will be sent to the patient regarding the next appointment. BI-RADS CATEGORY  3:  Probably benign. Electronically Signed   By: Curtistine Noble   On: 11/25/2023 10:14    Recent Labs: Lab Results  Component Value Date   WBC 12.2 (H) 09/03/2023   HGB 13.9 09/03/2023   PLT 327 09/03/2023   NA 140 07/03/2023   K 3.7 07/03/2023   CL 102 07/03/2023   CO2 32 07/03/2023   GLUCOSE 100 (H) 07/03/2023   BUN 20 07/03/2023   CREATININE 0.91 07/03/2023   BILITOT 0.4 07/03/2023   ALKPHOS 89 07/03/2023   AST 22 07/03/2023   ALT 19 07/03/2023   PROT 7.0 07/03/2023   ALBUMIN  4.6 07/03/2023   CALCIUM  9.1 07/03/2023   GFRAA 57 (L) 05/05/2019    Speciality Comments: No specialty comments available.  Procedures:  No procedures performed Allergies: Keflex [cephalexin], Penicillins, and Sulfa antibiotics   Assessment / Plan:     Visit Diagnoses: Sarcoidosis - patient was diagnosed with sarcoidosis at age 62 and was treated with prednisone  for 15 years.patient is followed by Dr. Euel.  She has been under care of Dr. Jakie now.  Had a CT scan did not show any progression of sarcoidosis.  Her sarcoidosis has been quiet.  However patient reports frequent cough.  She has been diagnosed with COPD.  She states she gets frequent COPD exacerbations requiring antibiotics and prednisone  taper.  Primary osteoarthritis of both hands-she has severe osteoarthritis in her hands with PIP and DIP thickening and incomplete fist formation.  Joint protection muscle strengthening was discussed.  Primary osteoarthritis of both feet-she denies any discomfort today.  She continues to have some pedal edema.  Use of compression socks was advised.  Pain in thoracic spine -she has severe thoracic kyphosis.  She states she went to physical therapy which was helpful.  She has been doing stretching exercises at home.  X-ray of her thoracic spine which showed mild thoracic spondylosis and scoliosis on June 27, 2021 by Dr. Delores.  Postural kyphosis of thoracic region  Age-related osteoporosis without  current pathological fracture - 05/01/23 T-score Dual Femur Neck Right 05/01/2023    T-score -2.5, BMD  0.688 g/cm2.  She continues to be on Prolia  injections every 6 months by her PCP.SABRA  Muscle spasm-improved after physical therapy.  Other medical problems are  listed as follows:  History of hyperlipidemia  Hx of CABG  History of gastroesophageal reflux (GERD)  Hx of colonic polyps  HTN (hypertension), benign  Chronic bronchitis, unspecified chronic bronchitis type (HCC)-she gets frequent cough and COPD exacerbations.  Renal artery stenosis (HCC) - s/p stent placement 11/2021.  Resting tremor  Pseudophakia of both eyes  Seborrheic keratosis  Family history of multiple sclerosis  Orders: No orders of the defined types were placed in this encounter.  No orders of the defined types were placed in this encounter.    Follow-Up Instructions: Return in about 6 months (around 05/31/2024) for Osteoarthritis, Sarcoidosis.   Maya Nash, MD  Note - This record has been created using Animal nutritionist.  Chart creation errors have been sought, but may not always  have been located. Such creation errors do not reflect on  the standard of medical care.

## 2023-11-25 ENCOUNTER — Encounter

## 2023-11-25 ENCOUNTER — Other Ambulatory Visit: Payer: Self-pay | Admitting: Nurse Practitioner

## 2023-11-25 ENCOUNTER — Ambulatory Visit
Admission: RE | Admit: 2023-11-25 | Discharge: 2023-11-25 | Disposition: A | Discharge status: Home / Self Care | Source: Ambulatory Visit | Attending: Nurse Practitioner

## 2023-11-25 DIAGNOSIS — N6489 Other specified disorders of breast: Secondary | ICD-10-CM

## 2023-11-30 NOTE — Progress Notes (Signed)
   Electrophysiology Office Note:   Date:  12/01/2023  ID:  Kristen Suarez, DOB 04/01/1945, MRN [id number]  Primary Cardiologist: Annabella Scarce, MD Electrophysiologist: Elspeth Sage, MD      History of Present Illness:   Kristen Suarez is a 78 y.o. 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   Since last being seen in our clinic the patient reports doing well.  Still feels like she is recovering from her CABG. Energy has gradually improved over the last 2 years. Working with PT and rheumatology.  Has not had syncope since December. Felt funny while in church and rushed to the bathroom, where she then fell with brief LOC - Consistent with her prior vaso-vagal type syncopes.   Review of systems complete and found to be negative unless listed in HPI.   Studies Reviewed:    EKG is ordered today. Personal review as below.  EKG Interpretation Date/Time:  Monday December 01 2023 08:43:07 EDT Ventricular Rate:  86 PR Interval:  166 QRS Duration:  78 QT Interval:  348 QTC Calculation: 416 R Axis:   81  Text Interpretation: Normal sinus rhythm Right atrial enlargement When compared with ECG of 29-Oct-2022 15:17, T wave inversion more evident in Anterior leads Confirmed by Lesia Sharper [phone]) on 12/01/2023 8:54:42 AM    Arrhythmia/Device History MDT- ILR (RRT 12/15/20)   Physical Exam:   VS:  BP (!) 148/76   Pulse 86   Ht 5' 2 (1.575 m)   Wt 130 lb (59 kg)   SpO2 97%   BMI 23.78 kg/m    Wt Readings from Last 3 Encounters:  12/01/23 130 lb (59 kg)  10/24/23 127 lb 3.2 oz (57.7 kg)  09/03/23 128 lb 3.2 oz (58.2 kg)     GEN: No acute distress NECK: No JVD; No carotid bruits CARDIAC: Regular rate and rhythm, no murmurs, rubs, gallops RESPIRATORY:  Clear to auscultation without rales, wheezing or rhonchi  ABDOMEN: Soft, non-tender,  non-distended EXTREMITIES:  No edema; No deformity   ILR Interrogation- reviewed in detail today,  See PACEART report  ASSESSMENT AND PLAN:    Syncope s/p Medtronic Loop recorder Device Previously met ERI.   Interval syncope with no clear trigger, most likely vaso-vagal by report and history.   With Dr. Celine retirement, would recommend following up with General Cardiology as scheduled - To see EP as needed. Could consider replacement of her loop recorder OR repeat short term monitoring depending on symptoms and frequency.   Follow up with EP Team as needed.  Signed, Sharper Prentice Lesia, PA-C

## 2023-12-01 ENCOUNTER — Encounter: Payer: Self-pay | Admitting: Student

## 2023-12-01 ENCOUNTER — Ambulatory Visit: Attending: Student | Admitting: Student

## 2023-12-01 VITALS — BP 148/76 | HR 86 | Ht 62.0 in | Wt 130.0 lb

## 2023-12-01 DIAGNOSIS — R55 Syncope and collapse: Secondary | ICD-10-CM | POA: Insufficient documentation

## 2023-12-01 NOTE — Patient Instructions (Signed)
 Medication Instructions:  Your physician recommends that you continue on your current medications as directed. Please refer to the Current Medication list given to you today.  *If you need a refill on your cardiac medications before your next appointment, please call your pharmacy*  Lab Work: None ordered If you have labs (blood work) drawn today and your tests are completely normal, you will receive your results only by: MyChart Message (if you have MyChart) OR A paper copy in the mail If you have any lab test that is abnormal or we need to change your treatment, we will call you to review the results.  Follow-Up: At [name], you and your health needs are our priority.  As part of our continuing mission to provide you with exceptional heart care, our providers are all part of one team.  This team includes your primary Cardiologist (physician) and Advanced Practice Providers or APPs (Physician Assistants and Nurse Practitioners) who all work together to provide you with the care you need, when you need it.  Your next appointment:   As needed with EP

## 2023-12-02 ENCOUNTER — Ambulatory Visit: Attending: Rheumatology | Admitting: Rheumatology

## 2023-12-02 ENCOUNTER — Encounter: Payer: Self-pay | Admitting: Rheumatology

## 2023-12-02 VITALS — BP 156/76 | HR 77 | Temp 97.4°F | Resp 16 | Ht 62.0 in | Wt 128.8 lb

## 2023-12-02 DIAGNOSIS — Z8719 Personal history of other diseases of the digestive system: Secondary | ICD-10-CM | POA: Diagnosis present

## 2023-12-02 DIAGNOSIS — M19072 Primary osteoarthritis, left ankle and foot: Secondary | ICD-10-CM | POA: Insufficient documentation

## 2023-12-02 DIAGNOSIS — M19042 Primary osteoarthritis, left hand: Secondary | ICD-10-CM | POA: Diagnosis present

## 2023-12-02 DIAGNOSIS — M19071 Primary osteoarthritis, right ankle and foot: Secondary | ICD-10-CM | POA: Insufficient documentation

## 2023-12-02 DIAGNOSIS — M62838 Other muscle spasm: Secondary | ICD-10-CM | POA: Insufficient documentation

## 2023-12-02 DIAGNOSIS — M810 Age-related osteoporosis without current pathological fracture: Secondary | ICD-10-CM | POA: Diagnosis present

## 2023-12-02 DIAGNOSIS — Z860100 Personal history of colon polyps, unspecified: Secondary | ICD-10-CM | POA: Insufficient documentation

## 2023-12-02 DIAGNOSIS — Z951 Presence of aortocoronary bypass graft: Secondary | ICD-10-CM | POA: Insufficient documentation

## 2023-12-02 DIAGNOSIS — I10 Essential (primary) hypertension: Secondary | ICD-10-CM | POA: Diagnosis present

## 2023-12-02 DIAGNOSIS — M546 Pain in thoracic spine: Secondary | ICD-10-CM | POA: Diagnosis present

## 2023-12-02 DIAGNOSIS — I701 Atherosclerosis of renal artery: Secondary | ICD-10-CM | POA: Insufficient documentation

## 2023-12-02 DIAGNOSIS — Z820 Family history of epilepsy and other diseases of the nervous system: Secondary | ICD-10-CM | POA: Diagnosis present

## 2023-12-02 DIAGNOSIS — L821 Other seborrheic keratosis: Secondary | ICD-10-CM | POA: Insufficient documentation

## 2023-12-02 DIAGNOSIS — D869 Sarcoidosis, unspecified: Secondary | ICD-10-CM | POA: Diagnosis present

## 2023-12-02 DIAGNOSIS — M4004 Postural kyphosis, thoracic region: Secondary | ICD-10-CM | POA: Insufficient documentation

## 2023-12-02 DIAGNOSIS — J42 Unspecified chronic bronchitis: Secondary | ICD-10-CM | POA: Diagnosis present

## 2023-12-02 DIAGNOSIS — G252 Other specified forms of tremor: Secondary | ICD-10-CM | POA: Diagnosis present

## 2023-12-02 DIAGNOSIS — M19041 Primary osteoarthritis, right hand: Secondary | ICD-10-CM | POA: Diagnosis present

## 2023-12-02 DIAGNOSIS — Z961 Presence of intraocular lens: Secondary | ICD-10-CM | POA: Insufficient documentation

## 2023-12-02 DIAGNOSIS — Z8639 Personal history of other endocrine, nutritional and metabolic disease: Secondary | ICD-10-CM | POA: Diagnosis present

## 2023-12-03 ENCOUNTER — Other Ambulatory Visit: Payer: Self-pay | Admitting: Cardiovascular Disease

## 2023-12-15 ENCOUNTER — Encounter: Payer: Self-pay | Admitting: *Deleted

## 2023-12-19 ENCOUNTER — Other Ambulatory Visit (HOSPITAL_BASED_OUTPATIENT_CLINIC_OR_DEPARTMENT_OTHER): Payer: Self-pay

## 2023-12-19 MED ORDER — FLUZONE HIGH-DOSE 0.5 ML IM SUSY
0.5000 mL | PREFILLED_SYRINGE | Freq: Once | INTRAMUSCULAR | 0 refills | Status: AC
  Filled 2023-12-19: qty 0.5, 1d supply, fill #0

## 2023-12-19 MED ORDER — COMIRNATY 30 MCG/0.3ML IM SUSY
0.3000 mL | PREFILLED_SYRINGE | Freq: Once | INTRAMUSCULAR | 0 refills | Status: AC
  Filled 2023-12-19: qty 0.3, 1d supply, fill #0

## 2023-12-21 ENCOUNTER — Encounter: Payer: Self-pay | Admitting: Pulmonary Disease

## 2023-12-22 MED ORDER — BREZTRI AEROSPHERE 160-9-4.8 MCG/ACT IN AERO: 2.0000 | INHALATION_SPRAY | Freq: Two times a day (BID) | RESPIRATORY_TRACT | 2 refills | Status: AC

## 2023-12-23 ENCOUNTER — Ambulatory Visit: Attending: Cardiovascular Disease | Admitting: Cardiovascular Disease

## 2023-12-23 ENCOUNTER — Other Ambulatory Visit: Payer: Self-pay | Admitting: Pulmonary Disease

## 2023-12-23 ENCOUNTER — Encounter: Payer: Self-pay | Admitting: Pulmonary Disease

## 2023-12-23 VITALS — BP 142/74 | HR 82 | Ht 62.0 in | Wt 128.4 lb

## 2023-12-23 DIAGNOSIS — E785 Hyperlipidemia, unspecified: Secondary | ICD-10-CM | POA: Diagnosis present

## 2023-12-23 DIAGNOSIS — I2510 Atherosclerotic heart disease of native coronary artery without angina pectoris: Secondary | ICD-10-CM | POA: Diagnosis present

## 2023-12-23 DIAGNOSIS — I701 Atherosclerosis of renal artery: Secondary | ICD-10-CM | POA: Diagnosis present

## 2023-12-23 NOTE — Progress Notes (Signed)
 Cardiology Office Note   Date:  12/23/2023   ID:  TATYANA BIBER, DOB [birth year], MRN [id number]  PCP:  Katheen Roselie Rockford, NP  Cardiologist: Dr. Raford  Chief Complaint  Patient presents with   12 month follow up        History of Present Illness: Kristen Suarez is a 78 y.o. female who is here today for follow-up visit regarding renovascular hypertension.   She has known history of hypertension, coronary calcifications noted on previous CT scan, pulmonary sarcoidosis and asthma.  She was diagnosed with hypertension 20 years ago but had significant elevation in blood pressure in 2022.    She underwent renal artery duplex in June, 2022 which showed significant right renal artery stenosis with peak velocity of 291 with a bruit.  No flow was detected into the left renal artery.  Right kidney size was 10.6 cm and left kidney was 8 cm.  Renal artery angiography was performed in September 2022 which showed chronically occluded left renal artery with severe ostial stenosis in the right renal artery.  I performed successful angioplasty and stent placement to the right renal artery.  She underwent CABG in October 2023.    Most recent renal artery duplex in May of this year showed patent right renal artery stent with no significant restenosis.  She has been doing well overall but complains of recurrent respiratory infections.  Her blood pressure has been well-controlled at home.    Past Medical History:  Diagnosis Date   Abnormality of left atrial appendage 12/2021   s/p LAA clipping   Aortic valve sclerosis 09/25/2021   Arthritis    Asthma    CAD in native artery 08/15/2020   Chickenpox    Colon polyps 2010   Epistaxis 03/29/2021   Heart murmur    History of fainting spells of unknown cause    Hyperlipidemia    Hypertension    Renal artery stenosis 09/25/2021   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/1976   Weight loss, unintentional 12/31/2022    Past Surgical  History:  Procedure Laterality Date   ABDOMINAL HYSTERECTOMY  1987   BASAL CELL CARCINOMA EXCISION  06/30/2020   nose   CARDIAC CATHETERIZATION     CLIPPING OF ATRIAL APPENDAGE N/A 01/04/2022   Procedure: CLIPPING OF ATRIAL APPENDAGE USING SIZE 40 ATRICURE ATRIAL CLIP;  Surgeon: Murriel Toribio DEL, MD;  Location: [facility] OR;  Service: Open Heart Surgery;  Laterality: N/A;   CORONARY ARTERY BYPASS GRAFT N/A 01/04/2022   Procedure: CORONARY ARTERY BYPASS GRAFTING (CABG) x  USING ENDOSCOPIC GREATER SAPHENOUS VEIN HARVEST;  Surgeon: Murriel Toribio DEL, MD;  Location: [facility] OR;  Service: Open Heart Surgery;  Laterality: N/A;   LEFT HEART CATH AND CORONARY ANGIOGRAPHY N/A 01/02/2022   Procedure: LEFT HEART CATH AND CORONARY ANGIOGRAPHY;  Surgeon: Burnard Debby LABOR, MD;  [facility];  Service: Cardiovascular;  Laterality: N/A;   LUNG BIOPSY  06/16/1976   PERIPHERAL VASCULAR INTERVENTION Right 11/29/2020   Procedure: PERIPHERAL VASCULAR INTERVENTION;  Surgeon: Darron Deatrice LABOR, MD;  [facility];  Service: Cardiovascular;  Laterality: Right;  Renal Artery   RENAL ANGIOGRAPHY N/A 11/29/2020   Procedure: RENAL ANGIOGRAPHY;  Surgeon: Darron Deatrice LABOR, MD;  [facility];  Service: Cardiovascular;  Laterality: N/A;   TEE WITHOUT CARDIOVERSION N/A 01/04/2022   Procedure: TRANSESOPHAGEAL ECHOCARDIOGRAM (TEE);  Surgeon: Murriel Toribio DEL, MD;  Location: [facility] OR;  Service: Open Heart  Surgery;  Laterality: N/A;     Current Outpatient Medications  Medication Sig Dispense Refill   albuterol  (PROVENTIL ) (2.5 MG/3ML) 0.083% nebulizer solution Take 3 mLs (2.5 mg total) by nebulization every 4 (four) hours as needed. 300 mL 1   albuterol  (VENTOLIN  HFA) 108 (90 Base) MCG/ACT inhaler Inhale 2 puffs into the lungs every 6 (six) hours as needed for wheezing or shortness of breath. 8 g 6   amLODipine  (NORVASC ) 10 MG tablet Take 1 tablet (10 mg total) by mouth daily. 90 tablet 3   aspirin   EC 81 MG tablet Take 1 tablet (81 mg total) by mouth daily. Swallow whole. 90 tablet 3   benzonatate  (TESSALON ) 200 MG capsule Take 1 capsule (200 mg total) by mouth 3 (three) times daily as needed. 90 capsule 0   budesonide-glycopyrrolate-formoterol  (BREZTRI  AEROSPHERE) 160-9-4.8 MCG/ACT AERO inhaler Inhale 2 puffs into the lungs in the morning and at bedtime. 32.1 each 2   calcium  carbonate (CALCIUM  600) 600 MG TABS tablet Take 600 mg by mouth daily with breakfast.     cetirizine (ZYRTEC) 10 MG tablet Take 10 mg by mouth daily.     CRANBERRY PO Take by mouth.     cyanocobalamin 1000 MCG tablet Take 1,000 mcg by mouth daily.     denosumab  (PROLIA ) 60 MG/ML SOSY injection Inject 60 mg into the skin once every 6 (six) months. Mail to office at [place], [place] [address] 1 mL 0   docusate sodium  (COLACE) 100 MG capsule Take 100 mg by mouth daily.      Evolocumab  (REPATHA  SURECLICK) 140 MG/ML SOAJ INJECT 140 MG INTO THE SKIN EVERY 14 (FOURTEEN) DAYS. 6 mL 3   famotidine  (PEPCID ) 40 MG tablet Take 40 mg by mouth at bedtime.     fluticasone  (FLONASE ) 50 MCG/ACT nasal spray SPRAY 2 SPRAYS INTO EACH NOSTRIL EVERY DAY 48 mL 3   MAGNESIUM  PO Take by mouth every other day.     metoprolol  succinate (TOPROL -XL) 25 MG 24 hr tablet TAKE 1 TABLET (25 MG TOTAL) BY MOUTH DAILY. 90 tablet 3   Multiple Vitamins-Minerals (MULTIVITAL PO) Take 1 tablet by mouth daily.      pantoprazole  (PROTONIX ) 40 MG tablet TAKE 1 TABLET BY MOUTH EVERY DAY 90 tablet 1   Probiotic Product (PROBIOTIC DAILY PO) Take 1 tablet by mouth daily.      rosuvastatin  (CRESTOR ) 40 MG tablet TAKE 1 TABLET BY MOUTH EVERY DAY 90 tablet 3   sodium chloride  (OCEAN) 0.65 % SOLN nasal spray Place 1 spray into both nostrils as needed for congestion.     traZODone  (DESYREL ) 50 MG tablet Take 0.5 tablets (25 mg total) by mouth at bedtime. 45 tablet 3   valsartan  (DIOVAN ) 40 MG tablet TAKE 1 TABLET BY MOUTH EVERY DAY 90 tablet 2    Current Facility-Administered Medications  Medication Dose Route Frequency Provider Last Rate Last Admin   denosumab  (PROLIA ) injection 60 mg  60 mg Subcutaneous Once Nche, Charlotte Lum, NP        Allergies:   Keflex [cephalexin], Penicillins, and Sulfa antibiotics    Social History:  The patient  reports that she has never smoked. She has been exposed to tobacco smoke. She has never used smokeless tobacco. She reports that she does not currently use alcohol. She reports that she does not use drugs.   Family History:  The patient's family history includes Alcohol abuse in her brother, brother, father, and sister; Arthritis in her mother;  Cancer in her mother; Depression in her mother; Early death in her brother; Heart attack in her brother and mother; Heart disease in her brother, brother, father, maternal grandfather, paternal grandfather, and sister; Hyperlipidemia in her brother, brother, father, mother, sister, sister, and son; Hypertension in her brother, father, mother, and sister; Multiple sclerosis in her daughter; Stroke in her sister.    ROS:  Please see the history of present illness.   Otherwise, review of systems are positive for none.   All other systems are reviewed and negative.    PHYSICAL EXAM: VS:  BP (!) 142/74 (BP Location: Right Arm, Patient Position: Sitting)   Pulse 82   Ht 5' 2 (1.575 m)   Wt 128 lb 6.4 oz (58.2 kg)   SpO2 97%   BMI 23.48 kg/m  , BMI Body mass index is 23.48 kg/m. GEN: Well nourished, well developed, in no acute distress  HEENT: normal  Neck: no JVD, carotid bruits, or masses Cardiac: RRR; no rubs, or gallops,no edema .  2 /6 systolic murmur in the aortic area. Respiratory:  clear to auscultation bilaterally, normal work of breathing GI: soft, nontender, nondistended, + BS MS: no deformity or atrophy  Skin: warm and dry, no rash Neuro:  Strength and sensation are intact Psych: euthymic mood, full affect    EKG:  EKG is not ordered  today.    Recent Labs: 12/31/2022: TSH 2.030 07/03/2023: ALT 19; BUN 20; Creatinine, Ser 0.91; Potassium 3.7; Sodium 140 09/03/2023: Hemoglobin 13.9; Platelets 327    Lipid Panel    Component Value Date/Time   CHOL 119 07/03/2023 0819   CHOL 119 07/23/2022 0819   TRIG 75.0 07/03/2023 0819   HDL 58.90 07/03/2023 0819   HDL 46 07/23/2022 0819   CHOLHDL 2 07/03/2023 0819   VLDL 15.0 07/03/2023 0819   LDLCALC 45 07/03/2023 0819   LDLCALC 54 07/23/2022 0819      Wt Readings from Last 3 Encounters:  12/23/23 128 lb 6.4 oz (58.2 kg)  12/02/23 128 lb 12.8 oz (58.4 kg)  12/01/23 130 lb (59 kg)          No data to display            ASSESSMENT AND PLAN:  1.  Renal artery stenosis: Status post  stenting of the right renal artery with chronically occluded left renal artery.  Her blood pressure is well-controlled on only 2 medications.  I reviewed her most recent labs done in April which showed a GFR of 60.  Renal artery duplex in May showed no significant restenosis.  Will plan on repeat renal artery duplex in May of next year and if no significant restenosis, no plans for further surveillance .  2.  Hyperlipidemia: She is doing well with rosuvastatin  and Repatha .  Most recent lipid profile showed an LDL of 45.  3.  Coronary artery disease status post CABG: No angina at the present time.    Disposition: Follow-up with me as needed if renal artery duplex is abnormal next year.  Signed,  Deatrice Cage, MD  12/23/2023 8:42 AM    [facility]

## 2023-12-23 NOTE — Patient Instructions (Signed)
 Medication Instructions:  No changes *If you need a refill on your cardiac medications before your next appointment, please call your pharmacy*  Lab Work: None ordered If you have labs (blood work) drawn today and your tests are completely normal, you will receive your results only by: MyChart Message (if you have MyChart) OR A paper copy in the mail If you have any lab test that is abnormal or we need to change your treatment, we will call you to review the results.  Testing/Procedures: None ordered  Follow-Up: At [facility], you and your health needs are our priority.  As part of our continuing mission to provide you with exceptional heart care, our providers are all part of one team.  This team includes your primary Cardiologist (physician) and Advanced Practice Providers or APPs (Physician Assistants and Nurse Practitioners) who all work together to provide you with the care you need, when you need it.  Your next appointment:   Follow up as needed

## 2023-12-31 ENCOUNTER — Ambulatory Visit (INDEPENDENT_AMBULATORY_CARE_PROVIDER_SITE_OTHER): Admitting: Pulmonary Disease

## 2023-12-31 ENCOUNTER — Encounter: Payer: Self-pay | Admitting: Pulmonary Disease

## 2023-12-31 VITALS — BP 132/70 | HR 79 | Temp 98.2°F | Ht 62.0 in | Wt 130.4 lb

## 2023-12-31 DIAGNOSIS — I2510 Atherosclerotic heart disease of native coronary artery without angina pectoris: Secondary | ICD-10-CM | POA: Diagnosis not present

## 2023-12-31 DIAGNOSIS — Z951 Presence of aortocoronary bypass graft: Secondary | ICD-10-CM

## 2023-12-31 DIAGNOSIS — J471 Bronchiectasis with (acute) exacerbation: Secondary | ICD-10-CM | POA: Diagnosis not present

## 2023-12-31 DIAGNOSIS — D860 Sarcoidosis of lung: Secondary | ICD-10-CM

## 2023-12-31 DIAGNOSIS — J4489 Other specified chronic obstructive pulmonary disease: Secondary | ICD-10-CM | POA: Diagnosis not present

## 2023-12-31 DIAGNOSIS — R053 Chronic cough: Secondary | ICD-10-CM

## 2023-12-31 MED ORDER — SODIUM CHLORIDE 3 % IN NEBU: INHALATION_SOLUTION | RESPIRATORY_TRACT | 12 refills | Status: AC | PRN

## 2023-12-31 MED ORDER — AEROBIKA DEVI: 0 refills | Status: AC

## 2023-12-31 NOTE — Progress Notes (Signed)
 Kristen Suarez    [id number]    10-08-1945  Primary Care Physician:Nche, Roselie Rockford, NP  Referring Physician: Katheen Roselie Rockford, NP [address] [place],  [place] [address]  Chief complaint:   Patient in for follow-up today Increased cough and congestion  HPI:  Patient with a history of sarcoidosis, obstructive lung disease, emphysema  Continues to have significant cough and congestion Mucus production on a regular basis Sometimes use Mucinex  to help with clearance  Has not any fevers or chills  No chest pains or chest discomfort  The coughing bouts are starting to impact and limit activities  Mucus is clear to yellow  She does try to stay active, tries to exercise regularly  She is s/p CABG in 2023  Has a history of severe obstructive lung disease, restrictive disease associated with sarcoidosis, significant scarring in the lungs  Uses Breztri  regularly, rescue inhalers as needed  Concerned about allergies, uses Zyrtec   Outpatient Encounter Medications as of 12/31/2023  Medication Sig   albuterol  (PROVENTIL ) (2.5 MG/3ML) 0.083% nebulizer solution Take 3 mLs (2.5 mg total) by nebulization every 4 (four) hours as needed.   albuterol  (VENTOLIN  HFA) 108 (90 Base) MCG/ACT inhaler Inhale 2 puffs into the lungs every 6 (six) hours as needed for wheezing or shortness of breath.   amLODipine  (NORVASC ) 10 MG tablet Take 1 tablet (10 mg total) by mouth daily.   aspirin  EC 81 MG tablet Take 1 tablet (81 mg total) by mouth daily. Swallow whole.   benzonatate  (TESSALON ) 200 MG capsule TAKE 1 CAPSULE BY MOUTH THREE TIMES A DAY AS NEEDED   budesonide-glycopyrrolate-formoterol  (BREZTRI  AEROSPHERE) 160-9-4.8 MCG/ACT AERO inhaler Inhale 2 puffs into the lungs in the morning and at bedtime.   calcium  carbonate (CALCIUM  600) 600 MG TABS tablet Take 600 mg by mouth daily with breakfast.   cetirizine (ZYRTEC) 10 MG tablet Take 10 mg by mouth daily.   CRANBERRY  PO Take by mouth.   cyanocobalamin 1000 MCG tablet Take 1,000 mcg by mouth daily.   denosumab  (PROLIA ) 60 MG/ML SOSY injection Inject 60 mg into the skin once every 6 (six) months. Mail to office at [facility] [place], [place] [address]   docusate sodium  (COLACE) 100 MG capsule Take 100 mg by mouth daily.    Evolocumab  (REPATHA  SURECLICK) 140 MG/ML SOAJ INJECT 140 MG INTO THE SKIN EVERY 14 (FOURTEEN) DAYS.   famotidine  (PEPCID ) 40 MG tablet Take 40 mg by mouth at bedtime.   fluticasone  (FLONASE ) 50 MCG/ACT nasal spray SPRAY 2 SPRAYS INTO EACH NOSTRIL EVERY DAY   MAGNESIUM  PO Take by mouth every other day.   metoprolol  succinate (TOPROL -XL) 25 MG 24 hr tablet TAKE 1 TABLET (25 MG TOTAL) BY MOUTH DAILY.   Multiple Vitamins-Minerals (MULTIVITAL PO) Take 1 tablet by mouth daily.    pantoprazole  (PROTONIX ) 40 MG tablet TAKE 1 TABLET BY MOUTH EVERY DAY   Probiotic Product (PROBIOTIC DAILY PO) Take 1 tablet by mouth daily.    rosuvastatin  (CRESTOR ) 40 MG tablet TAKE 1 TABLET BY MOUTH EVERY DAY   sodium chloride  (OCEAN) 0.65 % SOLN nasal spray Place 1 spray into both nostrils as needed for congestion.   traZODone  (DESYREL ) 50 MG tablet Take 0.5 tablets (25 mg total) by mouth at bedtime.   valsartan  (DIOVAN ) 40 MG tablet TAKE 1 TABLET BY MOUTH EVERY DAY   Facility-Administered Encounter Medications as of 12/31/2023  Medication   denosumab  (PROLIA ) injection 60  mg    Allergies as of 12/31/2023 - Review Complete 12/31/2023  Allergen Reaction Noted   Keflex [cephalexin] Rash 08/27/2018   Penicillins Itching 04/14/2018   Sulfa antibiotics Itching 04/14/2018    Past Medical History:  Diagnosis Date   Abnormality of left atrial appendage 12/2021   s/p LAA clipping   Aortic valve sclerosis 09/25/2021   Arthritis    Asthma    CAD in native artery 08/15/2020   Chickenpox    Colon polyps 2010   Epistaxis 03/29/2021   Heart murmur    History of fainting spells of unknown cause     Hyperlipidemia    Hypertension    Renal artery stenosis 09/25/2021   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/1976   Weight loss, unintentional 12/31/2022    Past Surgical History:  Procedure Laterality Date   ABDOMINAL HYSTERECTOMY  1987   BASAL CELL CARCINOMA EXCISION  06/30/2020   nose   CARDIAC CATHETERIZATION     CLIPPING OF ATRIAL APPENDAGE N/A 01/04/2022   Procedure: CLIPPING OF ATRIAL APPENDAGE USING SIZE 40 ATRICURE ATRIAL CLIP;  Surgeon: Murriel Toribio DEL, MD;  [facility]: [facility] OR;  Service: Open Heart Surgery;  Laterality: N/A;   CORONARY ARTERY BYPASS GRAFT N/A 01/04/2022   Procedure: CORONARY ARTERY BYPASS GRAFTING (CABG) x  USING ENDOSCOPIC GREATER SAPHENOUS VEIN HARVEST;  Surgeon: Murriel Toribio DEL, MD;  Location: [facility];  Service: Open Heart Surgery;  Laterality: N/A;   LEFT HEART CATH AND CORONARY ANGIOGRAPHY N/A 01/02/2022   Procedure: LEFT HEART CATH AND CORONARY ANGIOGRAPHY;  Surgeon: Burnard Debby LABOR, MD;  [facility];  Service: Cardiovascular;  Laterality: N/A;   LUNG BIOPSY  06/16/1976   PERIPHERAL VASCULAR INTERVENTION Right 11/29/2020   Procedure: PERIPHERAL VASCULAR INTERVENTION;  Surgeon: Darron Deatrice LABOR, MD;  [facility];  Service: Cardiovascular;  Laterality: Right;  Renal Artery   RENAL ANGIOGRAPHY N/A 11/29/2020   Procedure: RENAL ANGIOGRAPHY;  Surgeon: Darron Deatrice LABOR, MD;  [facility];  Service: Cardiovascular;  Laterality: N/A;   TEE WITHOUT CARDIOVERSION N/A 01/04/2022   Procedure: TRANSESOPHAGEAL ECHOCARDIOGRAM (TEE);  Surgeon: Murriel Toribio DEL, MD;  Location[facility] OR;  Service: Open Heart Surgery;  Laterality: N/A;    Family History  Problem Relation Age of Onset   Arthritis Mother    Cancer Mother    Depression Mother    Heart attack Mother    Hyperlipidemia Mother    Hypertension Mother    Alcohol abuse Father    Heart disease Father    Hypertension Father    Hyperlipidemia Father    Heart disease  Sister    Stroke Sister    Hyperlipidemia Sister    Alcohol abuse Sister    Hyperlipidemia Sister    Hypertension Sister    Alcohol abuse Brother    Heart disease Brother    Hypertension Brother    Hyperlipidemia Brother    Alcohol abuse Brother    Early death Brother    Heart attack Brother    Heart disease Brother    Hyperlipidemia Brother    Heart disease Maternal Grandfather    Heart disease Paternal Grandfather    Multiple sclerosis Daughter    Hyperlipidemia Son     Social History   Socioeconomic History   Marital status: Married    Spouse name: Not on file   Number of children: 3   Years of education: 18   Highest education level: Education administrator  degree (e.g., MA, MS, MEng, MEd, MSW, MBA)  Occupational History   Occupation: retired  Tobacco Use   Smoking status: Never    Passive exposure: Past   Smokeless tobacco: Never  Vaping Use   Vaping status: Never Used  Substance and Sexual Activity   Alcohol use: Not Currently    Comment: once in a while/rarely   Drug use: Never   Sexual activity: Not Currently  Other Topics Concern   Not on file  Social History Narrative   Right handed    No caffeine   One level   Retired Comptroller   Lives with husband   Social Drivers of Corporate investment banker Strain: Low Risk  (12/29/2023)   Overall Financial Resource Strain (CARDIA)    Difficulty of Paying Living Expenses: Not hard at all  Food Insecurity: No Food Insecurity (12/29/2023)   Hunger Vital Sign    Worried About Running Out of Food in the Last Year: Never true    Ran Out of Food in the Last Year: Never true  Transportation Needs: No Transportation Needs (12/29/2023)   PRAPARE - Administrator, Civil Service (Medical): No    Lack of Transportation (Non-Medical): No  Physical Activity: Insufficiently Active (12/29/2023)   Exercise Vital Sign    Days of Exercise per Week: 3 days    Minutes of Exercise per Session: 40 min  Stress: Stress Concern  Present (12/29/2023)   [employer] of Occupational Health - Occupational Stress Questionnaire    Feeling of Stress: To some extent  Social Connections: Moderately Integrated (12/29/2023)   Social Connection and Isolation Panel    Frequency of Communication with Friends and Family: Never    Frequency of Social Gatherings with Friends and Family: Once a week    Attends Religious Services: More than 4 times per year    Active Member of [employer] or Organizations: Yes    Attends Banker Meetings: 1 to 4 times per year    Marital Status: Married  Catering manager Violence: Not At Risk (03/03/2023)   Humiliation, Afraid, Rape, and Kick questionnaire    Fear of Current or Ex-Partner: No    Emotionally Abused: No    Physically Abused: No    Sexually Abused: No    Review of Systems  Respiratory:  Positive for cough and shortness of breath.     Vitals:   12/31/23 1344  BP: 132/70  Pulse: 79  Temp: 98.2 F (36.8 C)  SpO2: 96%     Physical Exam Constitutional:      Appearance: Normal appearance.  HENT:     Head: Normocephalic.     Mouth/Throat:     Mouth: Mucous membranes are moist.  Eyes:     General: No scleral icterus. Cardiovascular:     Rate and Rhythm: Normal rate and regular rhythm.     Heart sounds: No murmur heard.    No friction rub.  Pulmonary:     Effort: No respiratory distress.     Breath sounds: No stridor. Rhonchi present. No wheezing.  Musculoskeletal:     Cervical back: No rigidity or tenderness.  Neurological:     Mental Status: She is alert.  Psychiatric:        Mood and Affect: Mood normal.    Data Reviewed: Most recent CT scan of the chest 2025 reviewed with the patient, stable compared to 2023 and as far back as 2019 Pulmonary function test shows severe obstructive disease mild  progression from previous restriction, diffusing capacity stayed stable  Did review the CT scans and PFT with the patient during the visit  today  Assessment:   Emphysema/COPD - Continue Breztri  - Continue nebulization treatments  Sarcoidosis, pulmonary fibrosis Bronchiectasis - Some exacerbation - Encouraged to use nebulization treatments twice a day - Prescription placed for hypertonic saline to aid with secretion clearance - Will place a prescription for an Aerobika device to help with secretion clearance  Coronary artery disease, status post CABG - Participating in physical therapy - Improvement in activity levels   Plan/Recommendations: Thank you  [place] ordered to go to DME  Hypertonic saline - 4 cc to be nebulized twice a day  Continue nebulization treatments  Blood work at next visit  Follow-up in about 3 months  Encouraged to continue to stay active  Encouraged to give us  a call with any significant concerns  I spent 30 minutes dedicated to the care of this patient on the date of this encounter to include previsit review of records, face-to-face time with the patient discussing conditions above, post visit ordering of testing,ordering medications,independentlyinterpreting results, clinical documentation with electronic health record and communicated necessary findings to members of the patient's care team  Jennet Epley MD [facility] 12/31/2023, 1:49 PM  CC: Nche, Roselie Rockford, NP

## 2023-12-31 NOTE — Patient Instructions (Addendum)
 Cough and mucus production  Use your nebulizer twice a day with albuterol  and also hypertonic saline - Hypertonic saline prescription sent to pharmacy for you it is supposed to be used twice a day  An [place] device is a positive pressure device that helps with mucus clearance - Prescription sent in for you - This will go to a medical device company  It is supposed to be used twice a day following your nebulization treatments to help clear secretions  Obtain CBC, Chem-7, calcium  at next visit  Follow-up in 3 months  Call us  with significant concerns

## 2024-01-01 ENCOUNTER — Ambulatory Visit: Admitting: Nurse Practitioner

## 2024-01-01 ENCOUNTER — Encounter: Payer: Self-pay | Admitting: Nurse Practitioner

## 2024-01-01 VITALS — BP 122/66 | HR 67 | Temp 98.0°F | Ht 62.0 in | Wt 129.2 lb

## 2024-01-01 DIAGNOSIS — I10 Essential (primary) hypertension: Secondary | ICD-10-CM | POA: Diagnosis not present

## 2024-01-01 DIAGNOSIS — F5101 Primary insomnia: Secondary | ICD-10-CM

## 2024-01-01 DIAGNOSIS — R7303 Prediabetes: Secondary | ICD-10-CM | POA: Diagnosis not present

## 2024-01-01 LAB — COMPREHENSIVE METABOLIC PANEL WITH GFR
ALT: 17 U/L (ref 0–35)
AST: 20 U/L (ref 0–37)
Albumin: 4.4 g/dL (ref 3.5–5.2)
Alkaline Phosphatase: 65 U/L (ref 39–117)
BUN: 22 mg/dL (ref 6–23)
CO2: 31 meq/L (ref 19–32)
Calcium: 9.3 mg/dL (ref 8.4–10.5)
Chloride: 100 meq/L (ref 96–112)
Creatinine, Ser: 0.88 mg/dL (ref 0.40–1.20)
GFR: 62.94 mL/min (ref >60.00)
Glucose, Bld: 93 mg/dL (ref 70–99)
Potassium: 3.5 meq/L (ref 3.5–5.1)
Sodium: 138 meq/L (ref 135–145)
Total Bilirubin: 0.4 mg/dL (ref 0.2–1.2)
Total Protein: 7.3 g/dL (ref 6.0–8.3)

## 2024-01-01 LAB — HEMOGLOBIN A1C: Hgb A1c MFr Bld: 5.7 % (ref 4.6–6.5)

## 2024-01-01 MED ORDER — TRAZODONE HCL 50 MG PO TABS: 50.0000 mg | ORAL_TABLET | Freq: Every day | ORAL | 3 refills | Status: AC

## 2024-01-01 NOTE — Progress Notes (Signed)
 Established Patient Visit  Patient: Kristen Suarez   DOB: [birth year]   78 y.o. Female  MRN: [id number] Visit Date: 01/01/2024  Subjective:    Chief Complaint  Patient presents with   Follow-up    FASTING 6 month follow up for HTN, Hyperlipidemia    HPI Insomnia Resolved nocturia, but persistent non restorative sleep, need for daytime nap (2hrs at 2pm) despite 6hrs of sleep, has difficulty staying asleep No caffeine, no alcohol, no tobacco Normal home sleep study 2023, snoring noted Minimal improvement with trazodone  25mg   Increase trazodone  to 50mg  at hs Consider switching to Rozerem if no improvement  Primary hypertension BP at goal with metoprolol  and amlodipine  BP Readings from Last 3 Encounters:  01/01/24 122/66  12/31/23 132/70  12/23/23 (!) 142/74    Maintain med doses and appts with cardiology  Prediabetes Repeat hgbA1c  Reviewed medical, surgical, and social history today  Medications: Outpatient Medications Prior to Visit  Medication Sig   albuterol  (PROVENTIL ) (2.5 MG/3ML) 0.083% nebulizer solution Take 3 mLs (2.5 mg total) by nebulization every 4 (four) hours as needed.   albuterol  (VENTOLIN  HFA) 108 (90 Base) MCG/ACT inhaler Inhale 2 puffs into the lungs every 6 (six) hours as needed for wheezing or shortness of breath.   amLODipine  (NORVASC ) 10 MG tablet Take 1 tablet (10 mg total) by mouth daily.   aspirin  EC 81 MG tablet Take 1 tablet (81 mg total) by mouth daily. Swallow whole.   benzonatate  (TESSALON ) 200 MG capsule TAKE 1 CAPSULE BY MOUTH THREE TIMES A DAY AS NEEDED   budesonide-glycopyrrolate-formoterol  (BREZTRI  AEROSPHERE) 160-9-4.8 MCG/ACT AERO inhaler Inhale 2 puffs into the lungs in the morning and at bedtime.   calcium  carbonate (CALCIUM  600) 600 MG TABS tablet Take 600 mg by mouth daily with breakfast.   cetirizine (ZYRTEC) 10 MG tablet Take 10 mg by mouth daily.   CRANBERRY PO Take by mouth.   cyanocobalamin 1000 MCG tablet  Take 1,000 mcg by mouth daily.   denosumab  (PROLIA ) 60 MG/ML SOSY injection Inject 60 mg into the skin once every 6 (six) months. Mail to office at [facility] [place], [place] [address]   docusate sodium  (COLACE) 100 MG capsule Take 100 mg by mouth daily.    Evolocumab  (REPATHA  SURECLICK) 140 MG/ML SOAJ INJECT 140 MG INTO THE SKIN EVERY 14 (FOURTEEN) DAYS.   famotidine  (PEPCID ) 40 MG tablet Take 40 mg by mouth at bedtime.   fluticasone  (FLONASE ) 50 MCG/ACT nasal spray SPRAY 2 SPRAYS INTO EACH NOSTRIL EVERY DAY   MAGNESIUM  PO Take by mouth every other day.   metoprolol  succinate (TOPROL -XL) 25 MG 24 hr tablet TAKE 1 TABLET (25 MG TOTAL) BY MOUTH DAILY.   Multiple Vitamins-Minerals (MULTIVITAL PO) Take 1 tablet by mouth daily.    pantoprazole  (PROTONIX ) 40 MG tablet TAKE 1 TABLET BY MOUTH EVERY DAY   Probiotic Product (PROBIOTIC DAILY PO) Take 1 tablet by mouth daily.    Respiratory Therapy Supplies (AEROBIKA) DEVI To be used 2X day   rosuvastatin  (CRESTOR ) 40 MG tablet TAKE 1 TABLET BY MOUTH EVERY DAY   sodium chloride  (OCEAN) 0.65 % SOLN nasal spray Place 1 spray into both nostrils as needed for congestion.   sodium chloride  HYPERTONIC 3 % nebulizer solution Take by nebulization as needed for other. 4 cc nebulized  twice a day   valsartan  (DIOVAN ) 40 MG tablet TAKE 1 TABLET BY MOUTH EVERY DAY   [  DISCONTINUED] traZODone  (DESYREL ) 50 MG tablet Take 0.5 tablets (25 mg total) by mouth at bedtime.   Facility-Administered Medications Prior to Visit  Medication Dose Route Frequency Provider   denosumab  (PROLIA ) injection 60 mg  60 mg Subcutaneous Once Madyson Lukach Lum, NP   Reviewed past medical and social history.   ROS per HPI above  Last CBC Lab Results  Component Value Date   WBC 12.2 (H) 09/03/2023   HGB 13.9 09/03/2023   HCT 42.0 09/03/2023   MCV 91 09/03/2023   MCH 30.1 09/03/2023   RDW 13.5 09/03/2023   PLT 327 09/03/2023   Last metabolic panel Lab Results   Component Value Date   GLUCOSE 100 (H) 07/03/2023   NA 140 07/03/2023   K 3.7 07/03/2023   CL 102 07/03/2023   CO2 32 07/03/2023   BUN 20 07/03/2023   CREATININE 0.91 07/03/2023   GFR 60.67 07/03/2023   CALCIUM  9.1 07/03/2023   PHOS 3.4 07/31/2021   PROT 7.0 07/03/2023   ALBUMIN  4.6 07/03/2023   LABGLOB 2.2 07/23/2022   AGRATIO 2.0 07/23/2022   BILITOT 0.4 07/03/2023   ALKPHOS 89 07/03/2023   AST 22 07/03/2023   ALT 19 07/03/2023   ANIONGAP 8 01/12/2022   Last thyroid  functions Lab Results  Component Value Date   TSH 2.030 12/31/2022       Objective:  BP 122/66 (BP Location: Left Arm, Patient Position: Sitting, Cuff Size: Normal)   Pulse 67   Temp 98 F (36.7 C) (Oral)   Ht 5' 2 (1.575 m)   Wt 129 lb 3.2 oz (58.6 kg)   SpO2 97%   BMI 23.63 kg/m      Physical Exam Vitals and nursing note reviewed.  Cardiovascular:     Rate and Rhythm: Normal rate and regular rhythm.     Pulses: Normal pulses.     Heart sounds: Murmur heard.  Pulmonary:     Effort: Pulmonary effort is normal.     Breath sounds: Normal breath sounds.  Musculoskeletal:     Right lower leg: No edema.     Left lower leg: No edema.  Neurological:     Mental Status: She is alert and oriented to person, place, and time.     No results found for any visits on 01/01/24.    Assessment & Plan:    Problem List Items Addressed This Visit     Insomnia   Resolved nocturia, but persistent non restorative sleep, need for daytime nap (2hrs at 2pm) despite 6hrs of sleep, has difficulty staying asleep No caffeine, no alcohol, no tobacco Normal home sleep study 2023, snoring noted Minimal improvement with trazodone  25mg   Increase trazodone  to 50mg  at hs Consider switching to Rozerem if no improvement      Relevant Medications   traZODone  (DESYREL ) 50 MG tablet   Prediabetes   Repeat hgbA1c      Relevant Orders   Hemoglobin A1c   Primary hypertension - Primary   BP at goal with metoprolol   and amlodipine  BP Readings from Last 3 Encounters:  01/01/24 122/66  12/31/23 132/70  12/23/23 (!) 142/74    Maintain med doses and appts with cardiology      Relevant Orders   Comprehensive metabolic panel with GFR   Return in about 6 months (around 07/01/2024) for HTN, prediabetes, hyperlipidemia (fasting).     Roselie Mood, NP

## 2024-01-01 NOTE — Assessment & Plan Note (Signed)
 Repeat hgbA1c

## 2024-01-01 NOTE — Assessment & Plan Note (Signed)
 Resolved nocturia, but persistent non restorative sleep, need for daytime nap (2hrs at 2pm) despite 6hrs of sleep, has difficulty staying asleep No caffeine, no alcohol, no tobacco Normal home sleep study 2023, snoring noted Minimal improvement with trazodone  25mg   Increase trazodone  to 50mg  at hs Consider switching to Rozerem if no improvement

## 2024-01-01 NOTE — Assessment & Plan Note (Signed)
 BP at goal with metoprolol  and amlodipine  BP Readings from Last 3 Encounters:  01/01/24 122/66  12/31/23 132/70  12/23/23 (!) 142/74    Maintain med doses and appts with cardiology

## 2024-01-01 NOTE — Patient Instructions (Addendum)
 Go to lab Maintain Heart healthy diet and daily exercise. Maintain current medications. Send a mychart message if increased trazodone  dose does not help with sleep

## 2024-01-04 ENCOUNTER — Other Ambulatory Visit (HOSPITAL_BASED_OUTPATIENT_CLINIC_OR_DEPARTMENT_OTHER): Payer: Self-pay | Admitting: Family

## 2024-01-05 ENCOUNTER — Ambulatory Visit: Admitting: Pulmonary Disease

## 2024-01-07 ENCOUNTER — Ambulatory Visit: Payer: Self-pay | Admitting: Nurse Practitioner

## 2024-01-12 ENCOUNTER — Encounter: Payer: Self-pay | Admitting: Pulmonary Disease

## 2024-01-14 ENCOUNTER — Telehealth: Payer: Self-pay

## 2024-01-15 ENCOUNTER — Encounter: Payer: Self-pay | Admitting: Family Medicine

## 2024-01-19 ENCOUNTER — Institutional Professional Consult (permissible substitution): Payer: Medicare Other | Admitting: Psychology

## 2024-01-19 ENCOUNTER — Ambulatory Visit: Payer: Self-pay

## 2024-01-20 ENCOUNTER — Encounter: Payer: Self-pay | Admitting: Pulmonary Disease

## 2024-01-20 NOTE — Telephone Encounter (Signed)
 I called and spoke with patient, I reviewed the instructions per Dr. Cathye last office note.  Advised to use her nebulizer with the sodium chloride  hypertonic 3% nebulizer solution and use the aerobika device after the nebulizer twice daily to help with the mucous.  I let her know that no medication goes in the aerobika.  She verbalized understanding.  Nothing further needed.

## 2024-01-26 ENCOUNTER — Encounter: Payer: Self-pay | Admitting: Family Medicine

## 2024-01-26 ENCOUNTER — Ambulatory Visit (INDEPENDENT_AMBULATORY_CARE_PROVIDER_SITE_OTHER)

## 2024-01-26 ENCOUNTER — Ambulatory Visit (INDEPENDENT_AMBULATORY_CARE_PROVIDER_SITE_OTHER): Admitting: Family Medicine

## 2024-01-26 ENCOUNTER — Other Ambulatory Visit: Payer: Self-pay

## 2024-01-26 VITALS — BP 130/78 | HR 82 | Ht 62.0 in | Wt 129.0 lb

## 2024-01-26 DIAGNOSIS — G8929 Other chronic pain: Secondary | ICD-10-CM | POA: Diagnosis not present

## 2024-01-26 DIAGNOSIS — M25561 Pain in right knee: Secondary | ICD-10-CM

## 2024-01-26 NOTE — Patient Instructions (Addendum)
 Thank you for coming in today.   You received an injection today. Seek immediate medical attention if the joint becomes red, extremely painful, or is oozing fluid.   Please get an Xray today before you leave   See you back as needed.

## 2024-01-26 NOTE — Progress Notes (Signed)
 I, Leotis Batter, CMA acting as a scribe for Artist Lloyd, MD.  Delphine HERO Mcclain is a 78 y.o. female who presents to [facility] today for right knee pain. Pt was last seen by Dr. Lloyd on 08/25/23 for thoracic back pain. Pt received trigger point injections and was advised that PT could be ordered if no improvement.   Today, patient reports exacerbation of right knee pain. Pain x 2 weeks. Locates pain to medial aspect of the knee. Denies swelling. Has started kneeling more then bending over the past month but uses a cushion. Has been working on pelvic floor strengthening. Ambulating with a cane today. Taking Tylenol  prn with short-term relief.    Pertinent review of systems: No fevers or chills  Relevant historical information: Chronic thoracic pain.  Osteoporosis.  COPD.  Atrial fibrillation.  Sarcoidosis.   Exam:  BP 130/78   Pulse 82   Ht 5' 2 (1.575 m)   Wt 129 lb (58.5 kg)   SpO2 98%   BMI 23.59 kg/m  General: Well Developed, well nourished, and in no acute distress.   MSK: Right knee mild effusion normal-appearing otherwise normal motion.  Tender palpation medial joint line. Some laxity with MCL stress test. Intact strength.    Lab and Radiology Results  Procedure: Real-time Ultrasound Guided Injection of right knee joint superior lateral patella space Device: Philips Affiniti 50G/GE Logiq Images permanently stored and available for review in PACS Verbal informed consent obtained.  Discussed risks and benefits of procedure. Warned about infection, bleeding, hyperglycemia damage to structures among others. Patient expresses understanding and agreement Time-out conducted.   Noted no overlying erythema, induration, or other signs of local infection.   Skin prepped in a sterile fashion.   Local anesthesia: Topical Ethyl chloride.   With sterile technique and under real time ultrasound guidance: 40 mg of Kenalog  and 2 mL of Marcaine   injected into knee joint. Fluid seen entering the joint capsule.   Completed without difficulty   Pain immediately resolved suggesting accurate placement of the medication.   Advised to call if fevers/chills, erythema, induration, drainage, or persistent bleeding.   Images permanently stored and available for review in the ultrasound unit.  Impression: Technically successful ultrasound guided injection.   X-ray images right knee obtained today personally and independently interpreted. Mild medial DJD.  Surgical clip present posterior medial knee Await formal radiology review    Assessment and Plan: 78 y.o. female with right knee pain due to exacerbation of DJD.  Plan for steroid injection today.  Continue strengthening exercises check back if not improving.   PDMP not reviewed this encounter. Orders Placed This Encounter  Procedures   US  LIMITED JOINT SPACE STRUCTURES LOW RIGHT(NO LINKED CHARGES)    Reason for Exam (SYMPTOM  OR DIAGNOSIS REQUIRED):   right knee pain    Preferred imaging location?:   [facility] Knee AP/LAT W/Sun[place] Right    Standing Status:   Future    Number of Occurrences:   1    Expiration Date:   02/25/2024    Reason for Exam (SYMPTOM  OR DIAGNOSIS REQUIRED):   right knee pain    Preferred imaging location?:   [facility]   No orders of the defined types were placed in this encounter.    Discussed warning signs or symptoms. Please see discharge instructions. Patient expresses understanding.   The above documentation has been reviewed and is accurate and complete Assata Juncaj  Joane, M.D.

## 2024-01-27 ENCOUNTER — Telehealth: Payer: Self-pay

## 2024-01-27 ENCOUNTER — Encounter: Payer: Medicare Other | Admitting: Psychology

## 2024-01-27 ENCOUNTER — Encounter: Admitting: Psychology

## 2024-01-27 NOTE — Telephone Encounter (Signed)
 CMN received for Aerobika sent to [facility] signed by provider and faxed confirmation received

## 2024-01-28 ENCOUNTER — Ambulatory Visit: Payer: Self-pay | Admitting: Family Medicine

## 2024-01-28 NOTE — Progress Notes (Signed)
 Right knee x-ray shows some swelling.  No broken bones.  No severe arthritis.

## 2024-01-29 ENCOUNTER — Ambulatory Visit: Admitting: Physician Assistant

## 2024-02-03 ENCOUNTER — Encounter: Payer: Self-pay | Admitting: Pulmonary Disease

## 2024-02-03 ENCOUNTER — Encounter: Payer: Self-pay | Admitting: Family Medicine

## 2024-02-03 NOTE — Telephone Encounter (Signed)
 Dr. Wynona Neat, ?Please see patient message and advise.  Thank you. ?

## 2024-02-19 ENCOUNTER — Other Ambulatory Visit (HOSPITAL_BASED_OUTPATIENT_CLINIC_OR_DEPARTMENT_OTHER): Payer: Self-pay | Admitting: Cardiovascular Disease

## 2024-02-19 NOTE — Telephone Encounter (Signed)
 NFN

## 2024-03-04 ENCOUNTER — Ambulatory Visit (HOSPITAL_BASED_OUTPATIENT_CLINIC_OR_DEPARTMENT_OTHER): Admitting: Family

## 2024-03-04 ENCOUNTER — Encounter (HOSPITAL_BASED_OUTPATIENT_CLINIC_OR_DEPARTMENT_OTHER): Payer: Self-pay | Admitting: Family

## 2024-03-04 VITALS — BP 130/64 | HR 83 | Ht 62.0 in | Wt 129.3 lb

## 2024-03-04 DIAGNOSIS — E785 Hyperlipidemia, unspecified: Secondary | ICD-10-CM

## 2024-03-04 DIAGNOSIS — I701 Atherosclerosis of renal artery: Secondary | ICD-10-CM | POA: Diagnosis not present

## 2024-03-04 DIAGNOSIS — I10 Essential (primary) hypertension: Secondary | ICD-10-CM | POA: Diagnosis not present

## 2024-03-04 DIAGNOSIS — I25118 Atherosclerotic heart disease of native coronary artery with other forms of angina pectoris: Secondary | ICD-10-CM

## 2024-03-04 NOTE — Progress Notes (Signed)
 [facility] Assessment:    Date:  03/04/2024   ID:  Kristen Suarez, DOB 1945-06-14, MRN [id number]  PCP:  Katheen Roselie Rockford, NP  Cardiologist:  Annabella Scarce, MD  Nephrologist:  Referring MD: Katheen Roselie Rockford, NP   CC: Hypertension  History of Present Illness:    Kristen Suarez is a 78 y.o. female with a hx of  with a hx of renovascular hypertension, syncope, CAD s/p CABGx4, pulmonary sarcoidosis, asthma, hyperlipidemia . Here to follow up with [facility].    She had ILR placed in [place] January 2019 due to syncope. This is now followed by Dr. Fernande. She was referred by her primary care provider to Dr. Scarce for management of hypertension. Renal artery duplex June 2022 with significant right renal artery stenosis with peak velocity 291 with bruit, no flow into left renal artery. She had angiography with [name] in September 2022 with chronically occluded left renal artery with severe ostial stenosis in right renal artery with sucessful angioplasty and stent placement to right renal artery. A few days post procedure she had hypotension and near syncope, HCTZ and Spironolactone  were discontinued.    Subsequently developed hypertension and Amlodipine , Irbesartan  increased.  03/2021 Irbesartan  increased to 150mg  BID. At follow up 09/2021 she was transitioned to Amlodipine /Valsartan /HCTZ (10/160/12.5) to limit pill burden. Seen 10/29/21 by Dr. Fernande. Due to dyspnea [name]  added for HFpEF and cardiac CT ordered and performed 11/20/21 showing severe CAD.   Admitted 12/31/21 due to syncope. Subsequent LHC 01/02/22 with severely calcified coronary arteries with multivessel CAD recommended for CABG. 01/04/22 underwent CABG x 4 (LIMA - LAD, SVG - OM, SVG - PDA, SVG - Diag) and Left atrial appendage closure (40mm Atriclip). Echo with LVEF >75%, trivial MR, small PFO not excluded. Carotid duplex with no significant stsenosis. Did have postoperative atrial  fibrillation and was discharged on Amiodarone  which was able to be discontinued.  Seen 02/19/23 by Dr. Scarce. Workup for her post-CABG fatigue was unremarkable.  Of visit 04/2023 sleep was improved with trazodone .  Home BP 120-130s.  At visit 09/03/2023 with Dr. Scarce BP controlled. Episode of vasovagal syncope in December, hydration encouraged. CBC unremarkable.  Seen by EP 12/01/23, syncope was vaso-vagal, recommended for follow up with EP PRN. It was noted could consider replacement of loop recorder of repeat short term monitor depending symptoms/severity in future if needed.    Evaluated by [name] 12/23/23 with known CTO of left renal artery, renal duplex 07/2023 with patent right renal artery stent. Recommended for repeat duplex 07/2024. If no restenosis at that time, no further surveillance.   Presents today for follow up independently. Remains discouraged at her lack of progress since CABG. She reports good progress with PT but was not able to sustain. She has had issues with respiratory infections and is following with pulmonology, Dr. Neda. This is now improving. Planning on PFT's in the spring. Her present exercise regimen includes chair yoga. Otherwise does mostly stretching at home. Reports BP at home has been well controlled.   Past Medical History:  Diagnosis Date   Abnormality of left atrial appendage 12/2021   s/p LAA clipping   Allergy 2019   Anxiety    Aortic valve sclerosis 09/25/2021   Arthritis    Asthma    CAD in native artery 08/15/2020   Cancer (HCC) 2022   Cataract 2017   Chickenpox    Colon polyps 2010   COPD (chronic obstructive pulmonary disease) (HCC)  Epistaxis 03/29/2021   GERD (gastroesophageal reflux disease)    Heart murmur    History of fainting spells of unknown cause    Hyperlipidemia    Hypertension    Renal artery stenosis 09/25/2021   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/1976   Weight loss, unintentional 12/31/2022    Past  Surgical History:  Procedure Laterality Date   ABDOMINAL HYSTERECTOMY  1987   BASAL CELL CARCINOMA EXCISION  06/30/2020   nose   CARDIAC CATHETERIZATION     CLIPPING OF ATRIAL APPENDAGE N/A 01/04/2022   Procedure: CLIPPING OF ATRIAL APPENDAGE USING SIZE 40 ATRICURE ATRIAL CLIP;  Surgeon: Murriel Toribio DEL, MD;  Location: [facility] OR;  Service: Open Heart Surgery;  Laterality: N/A;   CORONARY ARTERY BYPASS GRAFT N/A 01/04/2022   Procedure: CORONARY ARTERY BYPASS GRAFTING (CABG) x  USING ENDOSCOPIC GREATER SAPHENOUS VEIN HARVEST;  Surgeon: Murriel Toribio DEL, MD;  Location: [facility] OR;  Service: Open Heart Surgery;  Laterality: N/A;   EYE SURGERY  2018   LEFT HEART CATH AND CORONARY ANGIOGRAPHY N/A 01/02/2022   Procedure: LEFT HEART CATH AND CORONARY ANGIOGRAPHY;  Surgeon: Burnard Debby LABOR, MD;  Location: [facility] INVASIVE CV LAB;  Service: Cardiovascular;  Laterality: N/A;   LUNG BIOPSY  06/16/1976   PERIPHERAL VASCULAR INTERVENTION Right 11/29/2020   Procedure: PERIPHERAL VASCULAR INTERVENTION;  Surgeon: Darron Deatrice LABOR, MD;  Location: [facility]VAS[facility];  Service: Cardiovascular;  Laterality: Right;  Renal Artery   RENAL ANGIOGRAPHY N/A 11/29/2020   Procedure: RENAL ANGIOGRAPHY;  Surgeon: Darron Deatrice LABOR, MD;  Location: [facility];  Service: Cardiovascular;  Laterality: N/A;   TEE WITHOUT CARDIOVERSION N/A 01/04/2022   Procedure: TRANSESOPHAGEAL ECHOCARDIOGRAM (TEE);  Surgeon: Murriel Toribio DEL, MD;  Location: [facility] OR;  Service: Open Heart Surgery;  Laterality: N/A;    Current Medications: Current Meds  Medication Sig   albuterol  (PROVENTIL ) (2.5 MG/3ML) 0.083% nebulizer solution Take 3 mLs (2.5 mg total) by nebulization every 4 (four) hours as needed.   albuterol  (VENTOLIN  HFA) 108 (90 Base) MCG/ACT inhaler Inhale 2 puffs into the lungs every 6 (six) hours as needed for wheezing or shortness of breath.   amLODipine  (NORVASC ) 10 MG tablet Take 1 tablet (10 mg total) by mouth daily.   aspirin  EC 81 MG  tablet Take 1 tablet (81 mg total) by mouth daily. Swallow whole.   benzonatate  (TESSALON ) 200 MG capsule TAKE 1 CAPSULE BY MOUTH THREE TIMES A DAY AS NEEDED   budesonide-glycopyrrolate-formoterol  (BREZTRI  AEROSPHERE) 160-9-4.8 MCG/ACT AERO inhaler Inhale 2 puffs into the lungs in the morning and at bedtime.   calcium  carbonate (CALCIUM  600) 600 MG TABS tablet Take 600 mg by mouth daily with breakfast.   cetirizine (ZYRTEC) 10 MG tablet Take 10 mg by mouth daily.   CRANBERRY PO Take by mouth.   cyanocobalamin 1000 MCG tablet Take 1,000 mcg by mouth daily.   denosumab  (PROLIA ) 60 MG/ML SOSY injection Inject 60 mg into the skin once every 6 (six) months. Mail to office at [facility] [place], [place] [address]   docusate sodium  (COLACE) 100 MG capsule Take 100 mg by mouth daily.    Evolocumab  (REPATHA  SURECLICK) 140 MG/ML SOAJ INJECT 140 MG INTO THE SKIN EVERY 14 (FOURTEEN) DAYS.   famotidine  (PEPCID ) 40 MG tablet Take 40 mg by mouth at bedtime.   fluticasone  (FLONASE ) 50 MCG/ACT nasal spray SPRAY 2 SPRAYS INTO EACH NOSTRIL EVERY DAY   MAGNESIUM  PO Take by mouth every other  day.   metoprolol  succinate (TOPROL -XL) 25 MG 24 hr tablet TAKE 1 TABLET (25 MG TOTAL) BY MOUTH DAILY.   Multiple Vitamins-Minerals (MULTIVITAL PO) Take 1 tablet by mouth daily.    pantoprazole  (PROTONIX ) 40 MG tablet TAKE 1 TABLET BY MOUTH EVERY DAY   Probiotic Product (PROBIOTIC DAILY PO) Take 1 tablet by mouth daily.    Respiratory Therapy Supplies (AEROBIKA) DEVI To be used 2X day   rosuvastatin  (CRESTOR ) 40 MG tablet TAKE 1 TABLET BY MOUTH EVERY DAY   sodium chloride  (OCEAN) 0.65 % SOLN nasal spray Place 1 spray into both nostrils as needed for congestion.   sodium chloride  HYPERTONIC 3 % nebulizer solution Take by nebulization as needed for other. 4 cc nebulized  twice a day   traZODone  (DESYREL ) 50 MG tablet Take 1 tablet (50 mg total) by mouth at bedtime.   valsartan  (DIOVAN ) 40 MG tablet TAKE 1 TABLET BY MOUTH  EVERY DAY   Current Facility-Administered Medications for the 03/04/24 encounter (Office Visit) with Vannie Reche RAMAN, NP  Medication   denosumab  (PROLIA ) injection 60 mg     Allergies:   Keflex [cephalexin], Penicillins, and Sulfa antibiotics   Social History   Socioeconomic History   Marital status: Married    Spouse name: Not on file   Number of children: 3   Years of education: 90   Highest education level: Master's degree (e.g., MA, MS, MEng, MEd, MSW, MBA)  Occupational History   Occupation: retired  Tobacco Use   Smoking status: Never    Passive exposure: Past   Smokeless tobacco: Never  Vaping Use   Vaping status: Never Used  Substance and Sexual Activity   Alcohol use: Not Currently    Comment: once in a while/rarely   Drug use: Never   Sexual activity: Not Currently  Other Topics Concern   Not on file  Social History Narrative   Right handed    No caffeine   One level   Retired comptroller   Lives with husband   Social Drivers of Health   Tobacco Use: Low Risk (03/04/2024)   Patient History    Smoking Tobacco Use: Never    Smokeless Tobacco Use: Never    Passive Exposure: Past  Financial Resource Strain: Low Risk (12/29/2023)   Overall Financial Resource Strain (CARDIA)    Difficulty of Paying Living Expenses: Not hard at all  Food Insecurity: No Food Insecurity (12/29/2023)   Epic    Worried About Programme Researcher, Broadcasting/film/video in the Last Year: Never true    Ran Out of Food in the Last Year: Never true  Transportation Needs: No Transportation Needs (12/29/2023)   Epic    Lack of Transportation (Medical): No    Lack of Transportation (Non-Medical): No  Physical Activity: Insufficiently Active (12/29/2023)   Exercise Vital Sign    Days of Exercise per Week: 3 days    Minutes of Exercise per Session: 40 min  Stress: Stress Concern Present (12/29/2023)   [employer] of Occupational Health - Occupational Stress Questionnaire    Feeling of Stress: To  some extent  Social Connections: Moderately Integrated (12/29/2023)   Social Connection and Isolation Panel    Frequency of Communication with Friends and Family: Never    Frequency of Social Gatherings with Friends and Family: Once a week    Attends Religious Services: More than 4 times per year    Active Member of [employer] or Organizations: Yes    Attends Banker Meetings:  1 to 4 times per year    Marital Status: Married  Depression (PHQ2-9): Low Risk (07/02/2023)   Depression (PHQ2-9)    PHQ-2 Score: 0  Alcohol Screen: Low Risk (02/28/2023)   Alcohol Screen    Last Alcohol Screening Score (AUDIT): 0  Housing: Low Risk (12/29/2023)   Epic    Unable to Pay for Housing in the Last Year: No    Number of Times Moved in the Last Year: 0    Homeless in the Last Year: No  Utilities: Not At Risk (02/28/2023)   AHC Utilities    Threatened with loss of utilities: No  Health Literacy: Adequate Health Literacy (03/03/2023)   B1300 Health Literacy    Frequency of need for help with medical instructions: Never     Family History: The patient's family history includes Alcohol abuse in her brother, brother, father, and sister; Arthritis in her mother; Cancer in her mother; Depression in her mother; Early death in her brother; Heart attack in her brother and mother; Heart disease in her brother, brother, father, maternal grandfather, maternal grandmother, paternal grandfather, and sister; Hyperlipidemia in her brother, brother, father, mother, sister, sister, and son; Hypertension in her brother, father, mother, sister, and son; Multiple sclerosis in her daughter; Stroke in her sister.  ROS:   Please see the history of present illness.     All other systems reviewed and are negative.  EKGs/Labs/Other Studies Reviewed:         Recent Labs: 09/03/2023: Hemoglobin 13.9; Platelets 327 01/01/2024: ALT 17; BUN 22; Creatinine, Ser 0.88; Potassium 3.5; Sodium 138   Recent Lipid Panel     Component Value Date/Time   CHOL 119 07/03/2023 0819   CHOL 119 07/23/2022 0819   TRIG 75.0 07/03/2023 0819   HDL 58.90 07/03/2023 0819   HDL 46 07/23/2022 0819   CHOLHDL 2 07/03/2023 0819   VLDL 15.0 07/03/2023 0819   LDLCALC 45 07/03/2023 0819   LDLCALC 54 07/23/2022 0819    Physical Exam:   VS:  BP 130/64   Pulse 83   Ht 5' 2 (1.575 m)   Wt 129 lb 4.8 oz (58.7 kg)   SpO2 94%   BMI 23.65 kg/m  , BMI Body mass index is 23.65 kg/m. GENERAL:  Well appearing HEENT: Pupils equal round and reactive, fundi not visualized, oral mucosa unremarkable NECK:  No jugular venous distention, waveform within normal limits, carotid upstroke brisk and symmetric, no bruits, no thyromegaly LYMPHATICS:  No cervical adenopathy LUNGS:  Clear to auscultation bilaterally HEART:  RRR.  PMI not displaced or sustained,S1 and S2 within normal limits, no S3, no S4, no clicks, no rubs, no murmurs ABD:  Flat, positive bowel sounds normal in frequency in pitch, no bruits, no rebound, no guarding, no midline pulsatile mass, no hepatomegaly, no splenomegaly EXT:  2 plus pulses throughout, no edema, no cyanosis no clubbing SKIN:  No rashes no nodules NEURO:  Cranial nerves II through XII grossly intact, motor grossly intact throughout PSYCH:  Cognitively intact, oriented to person place and time   ASSESSMENT/PLAN:    HTN - BP well controlled. Continue current antihypertensive regimen Amlodipine  10mg  daily, Toprol  25mg  daily, Valsartan  40mg  daily. Discussed to monitor BP at home at least 2 hours after medications and sitting for 5-10 minutes.   Renal artery stenosis - known CTO of L renal artery with prior stenting of right renal artery. Renal artery duplex already ordered, due 07/2024.   CAD s/p CABG / HLD< LDL goal <  70- Stable with no anginal symptoms. No indication for ischemic evaluation.  Her fatigue after CABG remains unchanged. Did improve with PT but has not been able to sustain the level of activity.  GDMT aspirin , Metoprolol  25mg  daily, rosuvastatin  40mg  daily. 06/2023 LDL 45  Vasovagal syncope - No recurrence of syncope. Had lightheaded spell earlier this week but no near syncope, syncope. Encouraged to consider knee high compression stockings. Per prior EP notes, follow up PRN. If recurrent syncope could consider replace loop recorder vs monitor pending symptoms severity.   Screening for Secondary Hypertension:     08/15/2020   10:31 AM  Causes  Drugs/Herbals Screened     - Comments no issues  Renovascular HTN Screened     - Comments check renal Dopplers  Sleep Apnea Screened     - Comments no triggers    Relevant Labs/Studies:    Latest Ref Rng & Units 01/01/2024    9:36 AM 07/03/2023    8:19 AM 02/28/2023    9:59 AM  Basic Labs  Sodium 135 - 145 mEq/L 138  140  142   Potassium 3.5 - 5.1 mEq/L 3.5  3.7  4.3   Creatinine 0.40 - 1.20 mg/dL 9.11  9.08  8.97        Latest Ref Rng & Units 12/31/2022   12:00 AM 07/30/2022   11:16 AM  Thyroid    TSH 0.450 - 4.500 uIU/mL 2.030  1.660                 08/08/2023   10:02 AM  Renovascular   Renal Artery US  Completed Yes    Disposition:    FU with MD/APP/PharmD in 6 months    Medication Adjustments/Labs and Tests Ordered: Current medicines are reviewed at length with the patient today.  Concerns regarding medicines are outlined above.  No orders of the defined types were placed in this encounter.  No orders of the defined types were placed in this encounter.    Signed, Reche GORMAN Finder, NP  03/04/2024 2:01 PM    [facility]

## 2024-03-04 NOTE — Patient Instructions (Addendum)
 Medication Instructions:  Continue your current medications.     Follow-Up: Please follow up in 6 months in [facility] with Dr. Raford, [name], NP or Allean Mink PharmD    Special Instructions:   To prevent vasovagal spells: Stay well hydrated  Make position changes slowly Eat regular meals Consider knee high compression stockings during the day and with exercise

## 2024-03-08 ENCOUNTER — Ambulatory Visit: Payer: Medicare Other

## 2024-03-08 ENCOUNTER — Other Ambulatory Visit: Payer: Self-pay | Admitting: Pulmonary Disease

## 2024-03-08 VITALS — BP 136/70 | HR 91 | Temp 98.4°F | Ht 61.5 in | Wt 129.8 lb

## 2024-03-08 DIAGNOSIS — Z0000 Encounter for general adult medical examination without abnormal findings: Secondary | ICD-10-CM

## 2024-03-08 NOTE — Patient Instructions (Signed)
 Kristen Suarez,  Thank you for taking the time for your Medicare Wellness Visit. I appreciate your continued commitment to your health goals. Please review the care plan we discussed, and feel free to reach out if I can assist you further.  Please note that Annual Wellness Visits do not include a physical exam. Some assessments may be limited, especially if the visit was conducted virtually. If needed, we may recommend an in-person follow-up with your provider.  Ongoing Care Seeing your primary care provider every 3 to 6 months helps us  monitor your health and provide consistent, personalized care.   Referrals If a referral was made during today's visit and you haven't received any updates within two weeks, please contact the referred provider directly to check on the status.  Recommended Screenings:  Health Maintenance  Topic Date Due   Medicare Annual Wellness Visit  03/02/2024   COVID-19 Vaccine (11 - Pfizer risk 2025-26 season) 06/18/2024   DTaP/Tdap/Td vaccine (3 - Td or Tdap) 12/01/2032   Pneumococcal Vaccine for age over 68  Completed   Flu Shot  Completed   Osteoporosis screening with Bone Density Scan  Completed   Zoster (Shingles) Vaccine  Completed   Meningitis B Vaccine  Aged Out   Breast Cancer Screening  Discontinued   Colon Cancer Screening  Discontinued   Hepatitis C Screening  Discontinued       03/04/2024    9:11 AM  Advanced Directives  Does Patient Have a Medical Advance Directive? Yes  Type of Estate Agent of [place];Living will  Does patient want to make changes to medical advance directive? No - Patient declined  Copy of Healthcare Power of Attorney in Chart? No - copy requested    Vision: Annual vision screenings are recommended for early detection of glaucoma, cataracts, and diabetic retinopathy. These exams can also reveal signs of chronic conditions such as diabetes and high blood pressure.  Dental: Annual dental screenings help  detect early signs of oral cancer, gum disease, and other conditions linked to overall health, including heart disease and diabetes.  Please see the attached documents for additional preventive care recommendations.

## 2024-03-08 NOTE — Progress Notes (Signed)
 "  Chief Complaint  Patient presents with   Medicare Wellness     Subjective:   Kristen Suarez is a 78 y.o. female who presents for a Medicare Annual Wellness Visit.  Visit info / Clinical Intake: Medicare Wellness Visit Type:: Subsequent Annual Wellness Visit Persons participating in visit and providing information:: patient Medicare Wellness Visit Mode:: In-person (required for WTM) Interpreter Needed?: No Pre-visit prep was completed: yes AWV questionnaire completed by patient prior to visit?: yes Date:: 03/04/24 Living arrangements:: (Patient-Rptd) lives with spouse/significant other Patient's Overall Health Status Rating: (!) (Patient-Rptd) fair Typical amount of pain: (Patient-Rptd) some Does pain affect daily life?: (!) (Patient-Rptd) yes Are you currently prescribed opioids?: no  Dietary Habits and Nutritional Risks How many meals a day?: (Patient-Rptd) 2 Eats fruit and vegetables daily?: (Patient-Rptd) yes Most meals are obtained by: (Patient-Rptd) preparing own meals; eating out In the last 2 weeks, have you had any of the following?: none Diabetic:: no  Functional Status Activities of Daily Living (to include ambulation/medication): (Patient-Rptd) Independent Ambulation: (Patient-Rptd) Independent Medication Administration: (Patient-Rptd) Independent Home Management (perform basic housework or laundry): (Patient-Rptd) Needs assistance (comment) Manage your own finances?: (Patient-Rptd) yes Primary transportation is: (Patient-Rptd) driving; family / friends Concerns about vision?: no *vision screening is required for WTM* Concerns about hearing?: (!) yes Uses hearing aids?: (!) yes  Fall Screening Falls in the past year?: (Patient-Rptd) 0 Number of falls in past year: 0 Was there an injury with Fall?: 0 Fall Risk Category Calculator: 0 Patient Fall Risk Level: Low Fall Risk  Fall Risk Patient at Risk for Falls Due to: Medication side effect Fall risk  Follow up: Falls evaluation completed  Home and Transportation Safety: All rugs have non-skid backing?: (Patient-Rptd) N/A, no rugs All stairs or steps have railings?: (Patient-Rptd) yes Grab bars in the bathtub or shower?: (Patient-Rptd) yes Have non-skid surface in bathtub or shower?: (Patient-Rptd) yes Good home lighting?: (Patient-Rptd) yes Regular seat belt use?: (Patient-Rptd) yes Hospital stays in the last year:: (Patient-Rptd) no  Cognitive Assessment Difficulty concentrating, remembering, or making decisions? : (Patient-Rptd) yes Will 6CIT or Mini Cog be Completed: yes What year is it?: 0 points What month is it?: 0 points Give patient an address phrase to remember (5 components): [address] About what time is it?: 0 points Count backwards from 20 to 1: 0 points Say the months of the year in reverse: 0 points Repeat the address phrase from earlier: 0 points 6 CIT Score: 0 points  Advance Directives (For Healthcare) Does Patient Have a Medical Advance Directive?: Yes Does patient want to make changes to medical advance directive?: No - Patient declined Type of Advance Directive: Healthcare Power of [place]; Living will Copy of Healthcare Power of Attorney in Chart?: No - copy requested Copy of Living Will in Chart?: No - copy requested Would patient like information on creating a medical advance directive?: No - Patient declined  Reviewed/Updated  Reviewed/Updated: Reviewed All (Medical, Surgical, Family, Medications, Allergies, Care Teams, Patient Goals)    Allergies (verified) Keflex [cephalexin], Penicillins, and Sulfa antibiotics   Current Medications (verified) Outpatient Encounter Medications as of 03/08/2024  Medication Sig   albuterol  (PROVENTIL ) (2.5 MG/3ML) 0.083% nebulizer solution Take 3 mLs (2.5 mg total) by nebulization every 4 (four) hours as needed.   albuterol  (VENTOLIN  HFA) 108 (90 Base) MCG/ACT inhaler Inhale 2 puffs into the lungs  every 6 (six) hours as needed for wheezing or shortness of breath.   amLODipine  (NORVASC ) 10 MG  tablet Take 1 tablet (10 mg total) by mouth daily.   aspirin  EC 81 MG tablet Take 1 tablet (81 mg total) by mouth daily. Swallow whole.   benzonatate  (TESSALON ) 200 MG capsule TAKE 1 CAPSULE BY MOUTH THREE TIMES A DAY AS NEEDED   budesonide-glycopyrrolate-formoterol  (BREZTRI  AEROSPHERE) 160-9-4.8 MCG/ACT AERO inhaler Inhale 2 puffs into the lungs in the morning and at bedtime.   calcium  carbonate (CALCIUM  600) 600 MG TABS tablet Take 600 mg by mouth daily with breakfast.   cetirizine (ZYRTEC) 10 MG tablet Take 10 mg by mouth daily.   CRANBERRY PO Take by mouth.   cyanocobalamin 1000 MCG tablet Take 1,000 mcg by mouth daily.   denosumab  (PROLIA ) 60 MG/ML SOSY injection Inject 60 mg into the skin once every 6 (six) months. Mail to office at [facility] [place], [place] [address]   docusate sodium  (COLACE) 100 MG capsule Take 100 mg by mouth daily.    Evolocumab  (REPATHA  SURECLICK) 140 MG/ML SOAJ INJECT 140 MG INTO THE SKIN EVERY 14 (FOURTEEN) DAYS.   famotidine  (PEPCID ) 40 MG tablet Take 40 mg by mouth at bedtime.   fluticasone  (FLONASE ) 50 MCG/ACT nasal spray SPRAY 2 SPRAYS INTO EACH NOSTRIL EVERY DAY   MAGNESIUM  PO Take by mouth every other day.   metoprolol  succinate (TOPROL -XL) 25 MG 24 hr tablet TAKE 1 TABLET (25 MG TOTAL) BY MOUTH DAILY.   Multiple Vitamins-Minerals (MULTIVITAL PO) Take 1 tablet by mouth daily.    pantoprazole  (PROTONIX ) 40 MG tablet TAKE 1 TABLET BY MOUTH EVERY DAY   Probiotic Product (PROBIOTIC DAILY PO) Take 1 tablet by mouth daily.    Respiratory Therapy Supplies (AEROBIKA) DEVI To be used 2X day   rosuvastatin  (CRESTOR ) 40 MG tablet TAKE 1 TABLET BY MOUTH EVERY DAY   sodium chloride  (OCEAN) 0.65 % SOLN nasal spray Place 1 spray into both nostrils as needed for congestion.   sodium chloride  HYPERTONIC 3 % nebulizer solution Take by nebulization as needed for other. 4  cc nebulized  twice a day   traZODone  (DESYREL ) 50 MG tablet Take 1 tablet (50 mg total) by mouth at bedtime.   valsartan  (DIOVAN ) 40 MG tablet TAKE 1 TABLET BY MOUTH EVERY DAY   Facility-Administered Encounter Medications as of 03/08/2024  Medication   denosumab  (PROLIA ) injection 60 mg    History: Past Medical History:  Diagnosis Date   Abnormality of left atrial appendage 12/2021   s/p LAA clipping   Allergy 2019   Anxiety    Aortic valve sclerosis 09/25/2021   Arthritis    Asthma    CAD in native artery 08/15/2020   Cancer (HCC) 2022   Cataract 2017   Chickenpox    Colon polyps 2010   COPD (chronic obstructive pulmonary disease) (HCC)    Epistaxis 03/29/2021   GERD (gastroesophageal reflux disease)    Heart murmur    History of fainting spells of unknown cause    Hyperlipidemia    Hypertension    Renal artery stenosis 09/25/2021   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/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   Weight loss, unintentional 12/31/2022   Past Surgical History:  Procedure Laterality Date   ABDOMINAL HYSTERECTOMY  1987   BASAL CELL CARCINOMA EXCISION  06/30/2020   nose   CARDIAC CATHETERIZATION     CLIPPING OF ATRIAL APPENDAGE N/A 01/04/2022   Procedure: CLIPPING OF ATRIAL APPENDAGE USING SIZE 40 ATRICURE ATRIAL CLIP;  Surgeon: Murriel Toribio DEL, MD;  Location: [facility] OR;  Service: Open  Heart Surgery;  Laterality: N/A;   CORONARY ARTERY BYPASS GRAFT N/A 01/04/2022   Procedure: CORONARY ARTERY BYPASS GRAFTING (CABG) x  USING ENDOSCOPIC GREATER SAPHENOUS VEIN HARVEST;  Surgeon: Murriel Toribio DEL, MD;  Location: [facility] OR;  Service: Open Heart Surgery;  Laterality: N/A;   EYE SURGERY  2018   LEFT HEART CATH AND CORONARY ANGIOGRAPHY N/A 01/02/2022   Procedure: LEFT HEART CATH AND CORONARY ANGIOGRAPHY;  Surgeon: Burnard Debby LABOR, MD;  Location: [facility] INVASIVE CV LAB;  Service: Cardiovascular;  Laterality: N/A;   LUNG BIOPSY  06/16/1976   PERIPHERAL VASCULAR INTERVENTION Right 11/29/2020   Procedure:  PERIPHERAL VASCULAR INTERVENTION;  Surgeon: Darron Deatrice LABOR, MD;  Location: [facility];  Service: Cardiovascular;  Laterality: Right;  Renal Artery   RENAL ANGIOGRAPHY N/A 11/29/2020   Procedure: RENAL ANGIOGRAPHY;  Surgeon: Darron Deatrice LABOR, MD;  Location: [facility];  Service: Cardiovascular;  Laterality: N/A;   TEE WITHOUT CARDIOVERSION N/A 01/04/2022   Procedure: TRANSESOPHAGEAL ECHOCARDIOGRAM (TEE);  Surgeon: Murriel Toribio DEL, MD;  Location: [facility] OR;  Service: Open Heart Surgery;  Laterality: N/A;   Family History  Problem Relation Age of Onset   Arthritis Mother    Cancer Mother    Depression Mother    Heart attack Mother    Hyperlipidemia Mother    Hypertension Mother    Alcohol abuse Father    Heart disease Father    Hypertension Father    Hyperlipidemia Father    Heart disease Sister    Stroke Sister    Hyperlipidemia Sister    Alcohol abuse Sister    Hyperlipidemia Sister    Hypertension Sister    Alcohol abuse Brother    Heart disease Brother    Hypertension Brother    Hyperlipidemia Brother    Alcohol abuse Brother    Early death Brother    Heart attack Brother    Heart disease Brother    Hyperlipidemia Brother    Heart disease Maternal Grandfather    Heart disease Paternal Grandfather    Multiple sclerosis Daughter    Hyperlipidemia Son    Hypertension Son    Heart disease Maternal Grandmother    Social History   Occupational History   Occupation: retired  Tobacco Use   Smoking status: Never    Passive exposure: Past   Smokeless tobacco: Never  Vaping Use   Vaping status: Never Used  Substance and Sexual Activity   Alcohol use: Not Currently    Comment: once in a while/rarely   Drug use: Never   Sexual activity: Not Currently   Tobacco Counseling Counseling given: Not Answered  SDOH Screenings   Food Insecurity: No Food Insecurity (03/08/2024)  Housing: Unknown (03/08/2024)  Transportation Needs: No Transportation Needs  (03/08/2024)  Utilities: Not At Risk (03/08/2024)  Alcohol Screen: Low Risk (03/08/2024)  Depression (PHQ2-9): Medium Risk (03/08/2024)  Financial Resource Strain: Low Risk (03/08/2024)  Physical Activity: Sufficiently Active (03/08/2024)  Recent Concern: Physical Activity - Insufficiently Active (12/29/2023)  Social Connections: Moderately Integrated (03/08/2024)  Stress: Stress Concern Present (03/08/2024)  Tobacco Use: Low Risk (03/08/2024)  Health Literacy: Adequate Health Literacy (03/08/2024)   See flowsheets for full screening details  Depression Screen PHQ 2 & 9 Depression Scale- Over the past 2 weeks, how often have you been bothered by any of the following problems? Little interest or pleasure in doing things: 0 Feeling down, depressed, or hopeless (PHQ Adolescent also includes...irritable): 0 PHQ-2 Total Score: 0 Trouble falling or staying  asleep, or sleeping too much: 3 (takes trazadone) Feeling tired or having little energy: 3 Poor appetite or overeating (PHQ Adolescent also includes...weight loss): 0 Feeling bad about yourself - or that you are a failure or have let yourself or your family down: 0 Trouble concentrating on things, such as reading the newspaper or watching television (PHQ Adolescent also includes...like school work): 0 Moving or speaking so slowly that other people could have noticed. Or the opposite - being so fidgety or restless that you have been moving around a lot more than usual: 0 Thoughts that you would be better off dead, or of hurting yourself in some way: 0 PHQ-9 Total Score: 6 If you checked off any problems, how difficult have these problems made it for you to do your work, take care of things at home, or get along with other people?: Somewhat difficult  Depression Treatment Depression Interventions/Treatment : Currently on Treatment     Goals Addressed             This Visit's Progress    Patient Stated       03/08/2024, start right  start program             Objective:    Today's Vitals   03/08/24 1458 03/08/24 1521  BP: (!) 160/80 136/70  Pulse: 91   Temp: 98.4 F (36.9 C)   TempSrc: Oral   SpO2: 94%   Weight: 129 lb 12.8 oz (58.9 kg)   Height: 5' 1.5 (1.562 m)    Body mass index is 24.13 kg/m.  Hearing/Vision screen Hearing Screening - Comments:: Has hearing aids that are maintained Vision Screening - Comments:: Regular eye exams Immunizations and Health Maintenance Health Maintenance  Topic Date Due   COVID-19 Vaccine (11 - Pfizer risk 2025-26 season) 06/18/2024   Medicare Annual Wellness (AWV)  03/08/2025   DTaP/Tdap/Td (3 - Td or Tdap) 12/01/2032   Pneumococcal Vaccine: 50+ Years  Completed   Influenza Vaccine  Completed   Bone Density Scan  Completed   Zoster Vaccines- Shingrix   Completed   Meningococcal B Vaccine  Aged Out   Mammogram  Discontinued   Colonoscopy  Discontinued   Hepatitis C Screening  Discontinued        Assessment/Plan:  This is a routine wellness examination for Kristen Suarez.  Patient Care Team: Nche, Roselie Rockford, NP as PCP - General (Internal Medicine) Raford Riggs, MD as PCP - Cardiology (Cardiology) Shelah Lamar RAMAN, MD as Consulting Physician (Pulmonary Disease) Neda Jennet LABOR, MD as Consulting Physician (Pulmonary Disease) Frazier, Chad, OD (Optometry)  I have personally reviewed and noted the following in the patients chart:   Medical and social history Use of alcohol, tobacco or illicit drugs  Current medications and supplements including opioid prescriptions. Functional ability and status Nutritional status Physical activity Advanced directives List of other physicians Hospitalizations, surgeries, and ER visits in previous 12 months Vitals Screenings to include cognitive, depression, and falls Referrals and appointments  No orders of the defined types were placed in this encounter.  In addition, I have reviewed and discussed with  patient certain preventive protocols, quality metrics, and best practice recommendations. A written personalized care plan for preventive services as well as general preventive health recommendations were provided to patient.    Ardella FORBES Dawn, LPN   [id number]   Return in 1 year (on 03/08/2025).  After Visit Summary: (In Person-Declined) Patient declined AVS at this time.  Nurse Notes: No voiced or noted concerns at this time  "

## 2024-03-19 ENCOUNTER — Encounter: Payer: Self-pay | Admitting: Pulmonary Disease

## 2024-03-21 ENCOUNTER — Other Ambulatory Visit (HOSPITAL_BASED_OUTPATIENT_CLINIC_OR_DEPARTMENT_OTHER): Payer: Self-pay | Admitting: Family

## 2024-03-21 DIAGNOSIS — I10 Essential (primary) hypertension: Secondary | ICD-10-CM

## 2024-03-22 ENCOUNTER — Encounter: Payer: Self-pay | Admitting: Pulmonary Disease

## 2024-03-23 ENCOUNTER — Other Ambulatory Visit: Payer: Self-pay | Admitting: *Deleted

## 2024-03-23 ENCOUNTER — Other Ambulatory Visit: Payer: Self-pay | Admitting: Pulmonary Disease

## 2024-04-01 ENCOUNTER — Ambulatory Visit: Admitting: Pulmonary Disease

## 2024-04-01 ENCOUNTER — Encounter: Payer: Self-pay | Admitting: Pulmonary Disease

## 2024-04-01 VITALS — BP 130/64 | HR 89 | Temp 97.4°F | Ht 61.5 in | Wt 126.2 lb

## 2024-04-01 DIAGNOSIS — R053 Chronic cough: Secondary | ICD-10-CM | POA: Diagnosis not present

## 2024-04-01 DIAGNOSIS — J471 Bronchiectasis with (acute) exacerbation: Secondary | ICD-10-CM

## 2024-04-01 DIAGNOSIS — D869 Sarcoidosis, unspecified: Secondary | ICD-10-CM

## 2024-04-01 MED ORDER — PROMETHAZINE-CODEINE 6.25-10 MG/5ML PO SYRP: 5.0000 mL | ORAL_SOLUTION | ORAL | 0 refills | Status: AC | PRN

## 2024-04-01 MED ORDER — LEVOFLOXACIN 750 MG PO TABS: 750.0000 mg | ORAL_TABLET | Freq: Every day | ORAL | 0 refills | Status: AC

## 2024-04-01 MED ORDER — PREDNISONE 20 MG PO TABS: 20.0000 mg | ORAL_TABLET | Freq: Every day | ORAL | 0 refills | Status: AC

## 2024-04-01 NOTE — Patient Instructions (Signed)
 Follow-up in about 3 months  Will send in a prescription for antibiotics-Levaquin , prednisone  for 5 days  Cough medicine  Continue using your inhaler, hypertonic saline nebulization and flutter device  Call us  with significant concerns  We can make a decision about repeating a CAT scan in a few months   Sputum for Gram stain and cultures will be requested, do not worry too much about it if you are not able to bring up any significant sputum sample

## 2024-04-01 NOTE — Progress Notes (Signed)
 adea geisel Byrer    [id number]    02-02-1946  Primary Care Physician:Nche, Roselie Rockford, NP  Referring Physician: Katheen Roselie Rockford, NP [address] [place],  [place] [address]  Chief complaint:   Patient being seen for follow-up for sarcoidosis, bronchiectasis Increasing shortness of breath cough, congestion  Discussed the use of AI scribe software for clinical note transcription with the patient, who gave verbal consent to proceed.  History of Present Illness Kristen Suarez is a 79 year old female with bronchiectasis who presents with a flare-up of respiratory symptoms. She is accompanied by her husband, [name].  She began experiencing a flare-up of respiratory symptoms on December 29th, characterized by a dry cough that did not respond to a Pearl, leading to a sleepless night. She subsequently started taking Mucinex  and cough medicine in between Rose doses. The cough became productive, initially with clear sputum, which later turned yellow. Although there has been slow improvement, she continues to experience some coughing.  No fevers or chills, but she reports mild night sweats. Her last antibiotic use was in August or September of the previous year. She was doing well with her new regimen, which included saline solution, until this recent episode.  Her current medications include Breztri , which she takes two puffs twice a day, and saline solution used twice daily. She also uses an aerobic or flutter device twice a day after the nebulizer.  She has a history of bronchiectasis. March will mark two years since these episodes began.  She was supposed to start an exercise program recommended by [facility] from cardiology.  Increased cough, congestion, shortness of breath Last time she used a course of antibiotics was in August 2025  She was doing well up until recently she felt she may have caught an infection  Outpatient Encounter Medications as of  04/01/2024  Medication Sig   albuterol  (PROVENTIL ) (2.5 MG/3ML) 0.083% nebulizer solution TAKE 3 MLS BY NEBULIZATION EVERY 4 (FOUR) HOURS AS NEEDED.   albuterol  (VENTOLIN  HFA) 108 (90 Base) MCG/ACT inhaler Inhale 2 puffs into the lungs every 6 (six) hours as needed for wheezing or shortness of breath.   amLODipine  (NORVASC ) 10 MG tablet TAKE 1 TABLET BY MOUTH EVERY DAY   aspirin  EC 81 MG tablet Take 1 tablet (81 mg total) by mouth daily. Swallow whole.   benzonatate  (TESSALON ) 200 MG capsule TAKE 1 CAPSULE BY MOUTH THREE TIMES A DAY AS NEEDED   budesonide-glycopyrrolate-formoterol  (BREZTRI  AEROSPHERE) 160-9-4.8 MCG/ACT AERO inhaler Inhale 2 puffs into the lungs in the morning and at bedtime.   calcium  carbonate (CALCIUM  600) 600 MG TABS tablet Take 600 mg by mouth daily with breakfast.   cetirizine (ZYRTEC) 10 MG tablet Take 10 mg by mouth daily.   CRANBERRY PO Take by mouth.   cyanocobalamin 1000 MCG tablet Take 1,000 mcg by mouth daily.   denosumab  (PROLIA ) 60 MG/ML SOSY injection Inject 60 mg into the skin once every 6 (six) months. Mail to office at [facility] [place], [place] [address]   docusate sodium  (COLACE) 100 MG capsule Take 100 mg by mouth daily.    estradiol (ESTRACE) 0.01 % CREA vaginal cream Place vaginally.   Evolocumab  (REPATHA  SURECLICK) 140 MG/ML SOAJ INJECT 140 MG INTO THE SKIN EVERY 14 (FOURTEEN) DAYS.   famotidine  (PEPCID ) 40 MG tablet Take 40 mg by mouth at bedtime.   fluticasone  (FLONASE ) 50 MCG/ACT nasal spray SPRAY  2 SPRAYS INTO EACH NOSTRIL EVERY DAY   levofloxacin  (LEVAQUIN ) 750 MG tablet Take 1 tablet (750 mg total) by mouth daily.   MAGNESIUM  PO Take by mouth every other day.   metoprolol  succinate (TOPROL -XL) 25 MG 24 hr tablet TAKE 1 TABLET (25 MG TOTAL) BY MOUTH DAILY.   Multiple Vitamins-Minerals (MULTIVITAL PO) Take 1 tablet by mouth daily.    pantoprazole  (PROTONIX ) 40 MG tablet TAKE 1 TABLET BY MOUTH EVERY DAY   predniSONE  (DELTASONE ) 20 MG tablet  Take 1 tablet (20 mg total) by mouth daily with breakfast for 5 days.   Probiotic Product (PROBIOTIC DAILY PO) Take 1 tablet by mouth daily.    promethazine -codeine  (PHENERGAN  WITH CODEINE ) 6.25-10 MG/5ML syrup Take 5 mLs by mouth every 4 (four) hours as needed for up to 10 days for cough.   Respiratory Therapy Supplies (AEROBIKA) DEVI To be used 2X day   rosuvastatin  (CRESTOR ) 40 MG tablet TAKE 1 TABLET BY MOUTH EVERY DAY   sodium chloride  (OCEAN) 0.65 % SOLN nasal spray Place 1 spray into both nostrils as needed for congestion.   sodium chloride  HYPERTONIC 3 % nebulizer solution Take by nebulization as needed for other. 4 cc nebulized  twice a day   traZODone  (DESYREL ) 50 MG tablet Take 1 tablet (50 mg total) by mouth at bedtime.   valsartan  (DIOVAN ) 40 MG tablet TAKE 1 TABLET BY MOUTH EVERY DAY   Facility-Administered Encounter Medications as of 04/01/2024  Medication   denosumab  (PROLIA ) injection 60 mg    Allergies as of 04/01/2024 - Review Complete 04/01/2024  Allergen Reaction Noted   Keflex [cephalexin] Rash 08/27/2018   Penicillins Itching 04/14/2018   Sulfa antibiotics Itching 04/14/2018    Past Medical History:  Diagnosis Date   Abnormality of left atrial appendage 12/2021   s/p LAA clipping   Allergy 2019   Anxiety    Aortic valve sclerosis 09/25/2021   Arthritis    Asthma    CAD in native artery 08/15/2020   Cancer (HCC) 2022   Cataract 2017   Chickenpox    Colon polyps 2010   COPD (chronic obstructive pulmonary disease) (HCC)    Epistaxis 03/29/2021   GERD (gastroesophageal reflux disease)    Heart murmur    History of fainting spells of unknown cause    Hyperlipidemia    Hypertension    Renal artery stenosis 09/25/2021   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/1976   Weight loss, unintentional 12/31/2022    Past Surgical History:  Procedure Laterality Date   ABDOMINAL HYSTERECTOMY  1987   BASAL CELL CARCINOMA EXCISION  06/30/2020   nose   CARDIAC  CATHETERIZATION     CLIPPING OF ATRIAL APPENDAGE N/A 01/04/2022   Procedure: CLIPPING OF ATRIAL APPENDAGE USING SIZE 40 ATRICURE ATRIAL CLIP;  Surgeon: Murriel Toribio DEL, MD;  Location: [facility] OR;  Service: Open Heart Surgery;  Laterality: N/A;   CORONARY ARTERY BYPASS GRAFT N/A 01/04/2022   Procedure: CORONARY ARTERY BYPASS GRAFTING (CABG) x  USING ENDOSCOPIC GREATER SAPHENOUS VEIN HARVEST;  Surgeon: Murriel Toribio DEL, MD;  Location: [facility] OR;  Service: Open Heart Surgery;  Laterality: N/A;   EYE SURGERY  2018   LEFT HEART CATH AND CORONARY ANGIOGRAPHY N/A 01/02/2022   Procedure: LEFT HEART CATH AND CORONARY ANGIOGRAPHY;  Surgeon: Burnard Debby LABOR, MD;  Location: [facility] INVASIVE CV LAB;  Service: Cardiovascular;  Laterality: N/A;   LUNG BIOPSY  06/16/1976   PERIPHERAL VASCULAR INTERVENTION Right 11/29/2020   Procedure:  PERIPHERAL VASCULAR INTERVENTION;  Surgeon: Darron Deatrice LABOR, MD;  Location: [facility] INVASIVE CV LAB;  Service: Cardiovascular;  Laterality: Right;  Renal Artery   RENAL ANGIOGRAPHY N/A 11/29/2020   Procedure: RENAL ANGIOGRAPHY;  Surgeon: Darron Deatrice LABOR, MD;  Location: [facility] INVASIVE CV LAB;  Service: Cardiovascular;  Laterality: N/A;   TEE WITHOUT CARDIOVERSION N/A 01/04/2022   Procedure: TRANSESOPHAGEAL ECHOCARDIOGRAM (TEE);  Surgeon: Murriel Toribio DEL, MD;  Location: [facility] OR;  Service: Open Heart Surgery;  Laterality: N/A;    Family History  Problem Relation Age of Onset   Arthritis Mother    Cancer Mother    Depression Mother    Heart attack Mother    Hyperlipidemia Mother    Hypertension Mother    Alcohol abuse Father    Heart disease Father    Hypertension Father    Hyperlipidemia Father    Heart disease Sister    Stroke Sister    Hyperlipidemia Sister    Alcohol abuse Sister    Hyperlipidemia Sister    Hypertension Sister    Alcohol abuse Brother    Heart disease Brother    Hypertension Brother    Hyperlipidemia Brother    Alcohol abuse Brother    Early death Brother    Heart  attack Brother    Heart disease Brother    Hyperlipidemia Brother    Heart disease Maternal Grandfather    Heart disease Paternal Grandfather    Multiple sclerosis Daughter    Hyperlipidemia Son    Hypertension Son    Heart disease Maternal Grandmother     Social History   Socioeconomic History   Marital status: Married    Spouse name: Not on file   Number of children: 3   Years of education: 18   Highest education level: Master's degree (e.g., MA, MS, MEng, MEd, MSW, MBA)  Occupational History   Occupation: retired  Tobacco Use   Smoking status: Never    Passive exposure: Past   Smokeless tobacco: Never  Vaping Use   Vaping status: Never Used  Substance and Sexual Activity   Alcohol use: Not Currently    Comment: once in a while/rarely   Drug use: Never   Sexual activity: Not Currently  Other Topics Concern   Not on file  Social History Narrative   Right handed    No caffeine   One level   Retired comptroller   Lives with husband   Social Drivers of Health   Tobacco Use: Low Risk (04/01/2024)   Patient History    Smoking Tobacco Use: Never    Smokeless Tobacco Use: Never    Passive Exposure: Past  Financial Resource Strain: Low Risk (03/08/2024)   Overall Financial Resource Strain (CARDIA)    Difficulty of Paying Living Expenses: Not hard at all  Food Insecurity: No Food Insecurity (03/08/2024)   Epic    Worried About Radiation Protection Practitioner of Food in the Last Year: Never true    Ran Out of Food in the Last Year: Never true  Transportation Needs: No Transportation Needs (03/08/2024)   Epic    Lack of Transportation (Medical): No    Lack of Transportation (Non-Medical): No  Physical Activity: Sufficiently Active (03/08/2024)   Exercise Vital Sign    Days of Exercise per Week: 3 days    Minutes of Exercise per Session: 50 min  Recent Concern: Physical Activity - Insufficiently Active (12/29/2023)   Exercise Vital Sign    Days of Exercise per Week: 3 days  Minutes  of Exercise per Session: 40 min  Stress: Stress Concern Present (03/08/2024)   [employer] of Occupational Health - Occupational Stress Questionnaire    Feeling of Stress: Rather much  Social Connections: Moderately Integrated (03/08/2024)   Social Connection and Isolation Panel    Frequency of Communication with Friends and Family: More than three times a week    Frequency of Social Gatherings with Friends and Family: Once a week    Attends Religious Services: More than 4 times per year    Active Member of Clubs or Organizations: No    Attends Banker Meetings: Never    Marital Status: Married  Catering Manager Violence: Not At Risk (03/08/2024)   Epic    Fear of Current or Ex-Partner: No    Emotionally Abused: No    Physically Abused: No    Sexually Abused: No  Depression (PHQ2-9): Medium Risk (03/08/2024)   Depression (PHQ2-9)    PHQ-2 Score: 6  Alcohol Screen: Low Risk (03/08/2024)   Alcohol Screen    Last Alcohol Screening Score (AUDIT): 0  Housing: Unknown (03/08/2024)   Epic    Unable to Pay for Housing in the Last Year: No    Number of Times Moved in the Last Year: Not on file    Homeless in the Last Year: No  Utilities: Not At Risk (03/08/2024)   Epic    Threatened with loss of utilities: No  Health Literacy: Adequate Health Literacy (03/08/2024)   B1300 Health Literacy    Frequency of need for help with medical instructions: Never    Review of Systems  Respiratory:  Positive for cough and shortness of breath.     Vitals:   04/01/24 0903  BP: 130/64  Pulse: 89  Temp: (!) 97.4 F (36.3 C)  SpO2: 94%     Physical Exam Constitutional:      Appearance: Normal appearance.  HENT:     Head: Normocephalic.     Mouth/Throat:     Mouth: Mucous membranes are moist.  Eyes:     General: No scleral icterus.    Pupils: Pupils are equal, round, and reactive to light.  Cardiovascular:     Rate and Rhythm: Normal rate and regular rhythm.      Heart sounds: No murmur heard.    No friction rub.  Pulmonary:     Effort: No respiratory distress.     Breath sounds: No stridor. No wheezing or rhonchi.  Musculoskeletal:        General: No swelling. Normal range of motion.     Cervical back: No rigidity or tenderness.  Skin:    General: Skin is warm.  Neurological:     General: No focal deficit present.     Mental Status: She is alert.  Psychiatric:        Mood and Affect: Mood normal.    Data Reviewed: CT scan of the chest 05/21/2023 was reviewed showing fibrocalcific changes, bronchiectasis  PFT 07/24/2023 reviewed  Assessment and Plan Assessment & Plan Bronchiectasis with acute exacerbation Acute exacerbation of bronchiectasis with increased cough, production of yellow sputum, and night sweats. No fever or chills reported. Chronic changes in the lungs noted on previous CT scan, with no worsening observed. Exacerbation likely due to viral or bacterial infection, possibly from environmental exposure. Discussed the potential for unusual organisms in the lungs due to bronchiectasis, which may require antibiotic treatment. Emphasized the importance of symptomatic treatment and monitoring for unusual organisms in sputum. -  Prescribed a short course of antibiotics. - Prescribed a short course of prednisone . - Continue Breztri , two puffs twice a day. - Continue hypertonic saline solution twice a day. - Request sputum sample if coughing up significant material, preferably first thing in the morning. - Will consider repeating CT scan if symptoms do not settle.  Chronic cough Associated with bronchiectasis. Cough has been persistent but improving slowly. Discussed the role of cough medicine in managing symptoms and the importance of pacing oneself during exercise to avoid exacerbating symptoms. - Prescribed cough medicine for symptomatic relief. - Encouraged starting an exercise program as recommended by cardiology, with emphasis on pacing  oneself.  Prescription for Levaquin  sent to pharmacy Prescription for cough medicine with codeine  sent to pharmacy  Continue using hypertonic saline nebulization, continue using flutter device  Follow-up in about 3 months  We may consider repeating CT scan once symptoms are better controlled to get a new baseline   Orders Placed This Encounter  Procedures   Culture, sputum-assessment    I personally spent a total of 32 minutes in the care of the patient today including preparing to see the patient, getting/reviewing separately obtained history, performing a medically appropriate exam/evaluation, counseling and educating, placing orders, documenting clinical information in the EHR, and independently interpreting results.   Jennet Epley MD [facility] 04/01/2024, 9:37 AM  CC: Nche, Roselie Rockford, NP

## 2024-04-04 ENCOUNTER — Other Ambulatory Visit: Payer: Self-pay | Admitting: Emergency Medicine

## 2024-04-05 ENCOUNTER — Encounter: Payer: Self-pay | Admitting: Nurse Practitioner

## 2024-04-19 ENCOUNTER — Telehealth: Payer: Self-pay

## 2024-04-19 ENCOUNTER — Other Ambulatory Visit: Payer: Self-pay | Admitting: Nurse Practitioner

## 2024-04-19 ENCOUNTER — Other Ambulatory Visit (HOSPITAL_COMMUNITY): Payer: Self-pay

## 2024-04-19 DIAGNOSIS — M8589 Other specified disorders of bone density and structure, multiple sites: Secondary | ICD-10-CM

## 2024-04-19 NOTE — Telephone Encounter (Signed)
 Prolia  VOB initiated via [email or web address]  Next Prolia  inj DUE: 05/15/24   PHARMACY COPAY: $120

## 2024-04-20 NOTE — Telephone Encounter (Signed)
 SABRA

## 2024-05-25 ENCOUNTER — Encounter

## 2024-06-03 ENCOUNTER — Ambulatory Visit: Admitting: Rheumatology

## 2024-07-01 ENCOUNTER — Ambulatory Visit: Admitting: Nurse Practitioner

## 2024-07-12 ENCOUNTER — Ambulatory Visit: Admitting: Pulmonary Disease

## 2025-03-15 ENCOUNTER — Ambulatory Visit
